# Patient Record
Sex: Male | Born: 1940 | Race: White | Hispanic: No | Marital: Married | Smoking: Never smoker
Health system: Southern US, Community
[De-identification: ages and names within clinical notes are randomized; demographics above are authoritative.]

## PROBLEM LIST (undated history)

## (undated) DIAGNOSIS — I2510 Atherosclerotic heart disease of native coronary artery without angina pectoris: Secondary | ICD-10-CM

## (undated) DIAGNOSIS — I499 Cardiac arrhythmia, unspecified: Secondary | ICD-10-CM

## (undated) DIAGNOSIS — D649 Anemia, unspecified: Secondary | ICD-10-CM

## (undated) DIAGNOSIS — I4892 Unspecified atrial flutter: Secondary | ICD-10-CM

## (undated) DIAGNOSIS — G959 Disease of spinal cord, unspecified: Secondary | ICD-10-CM

## (undated) DIAGNOSIS — N183 Chronic kidney disease, stage 3 unspecified: Secondary | ICD-10-CM

## (undated) DIAGNOSIS — Z8719 Personal history of other diseases of the digestive system: Secondary | ICD-10-CM

## (undated) DIAGNOSIS — F329 Major depressive disorder, single episode, unspecified: Secondary | ICD-10-CM

## (undated) DIAGNOSIS — IMO0001 Reserved for inherently not codable concepts without codable children: Secondary | ICD-10-CM

## (undated) DIAGNOSIS — R296 Repeated falls: Secondary | ICD-10-CM

## (undated) DIAGNOSIS — F32A Depression, unspecified: Secondary | ICD-10-CM

## (undated) DIAGNOSIS — I82409 Acute embolism and thrombosis of unspecified deep veins of unspecified lower extremity: Secondary | ICD-10-CM

## (undated) DIAGNOSIS — H5461 Unqualified visual loss, right eye, normal vision left eye: Secondary | ICD-10-CM

## (undated) DIAGNOSIS — I872 Venous insufficiency (chronic) (peripheral): Secondary | ICD-10-CM

## (undated) DIAGNOSIS — M4802 Spinal stenosis, cervical region: Secondary | ICD-10-CM

## (undated) DIAGNOSIS — Z95810 Presence of automatic (implantable) cardiac defibrillator: Secondary | ICD-10-CM

## (undated) DIAGNOSIS — I429 Cardiomyopathy, unspecified: Secondary | ICD-10-CM

## (undated) DIAGNOSIS — I10 Essential (primary) hypertension: Secondary | ICD-10-CM

## (undated) DIAGNOSIS — G90521 Complex regional pain syndrome I of right lower limb: Secondary | ICD-10-CM

## (undated) DIAGNOSIS — K219 Gastro-esophageal reflux disease without esophagitis: Secondary | ICD-10-CM

## (undated) DIAGNOSIS — J189 Pneumonia, unspecified organism: Secondary | ICD-10-CM

## (undated) DIAGNOSIS — M1990 Unspecified osteoarthritis, unspecified site: Secondary | ICD-10-CM

## (undated) DIAGNOSIS — G9340 Encephalopathy, unspecified: Secondary | ICD-10-CM

## (undated) DIAGNOSIS — I639 Cerebral infarction, unspecified: Secondary | ICD-10-CM

## (undated) DIAGNOSIS — G589 Mononeuropathy, unspecified: Secondary | ICD-10-CM

## (undated) DIAGNOSIS — F0390 Unspecified dementia without behavioral disturbance: Secondary | ICD-10-CM

## (undated) DIAGNOSIS — E785 Hyperlipidemia, unspecified: Secondary | ICD-10-CM

## (undated) DIAGNOSIS — I219 Acute myocardial infarction, unspecified: Secondary | ICD-10-CM

## (undated) DIAGNOSIS — G8250 Quadriplegia, unspecified: Secondary | ICD-10-CM

## (undated) DIAGNOSIS — I5022 Chronic systolic (congestive) heart failure: Secondary | ICD-10-CM

## (undated) DIAGNOSIS — E119 Type 2 diabetes mellitus without complications: Secondary | ICD-10-CM

## (undated) DIAGNOSIS — E162 Hypoglycemia, unspecified: Secondary | ICD-10-CM

## (undated) DIAGNOSIS — G90511 Complex regional pain syndrome I of right upper limb: Secondary | ICD-10-CM

## (undated) DIAGNOSIS — M48061 Spinal stenosis, lumbar region without neurogenic claudication: Secondary | ICD-10-CM

## (undated) HISTORY — DX: Encephalopathy, unspecified: G93.40

## (undated) HISTORY — DX: Repeated falls: R29.6

## (undated) HISTORY — DX: Unspecified atrial flutter: I48.92

## (undated) HISTORY — DX: Quadriplegia, unspecified: G82.50

## (undated) HISTORY — DX: Chronic kidney disease, stage 3 (moderate): N18.3

## (undated) HISTORY — PX: TOTAL HIP ARTHROPLASTY: SHX124

## (undated) HISTORY — DX: Chronic kidney disease, stage 3 unspecified: N18.30

## (undated) HISTORY — DX: Venous insufficiency (chronic) (peripheral): I87.2

## (undated) HISTORY — PX: TOE AMPUTATION: SHX809

## (undated) HISTORY — DX: Disease of spinal cord, unspecified: G95.9

## (undated) HISTORY — PX: OTHER SURGICAL HISTORY: SHX169

## (undated) HISTORY — PX: CARPAL TUNNEL RELEASE: SHX101

## (undated) HISTORY — DX: Atherosclerotic heart disease of native coronary artery without angina pectoris: I25.10

## (undated) HISTORY — DX: Unspecified osteoarthritis, unspecified site: M19.90

## (undated) HISTORY — DX: Complex regional pain syndrome i of right lower limb: G90.521

## (undated) HISTORY — DX: Chronic systolic (congestive) heart failure: I50.22

## (undated) HISTORY — DX: Hypoglycemia, unspecified: E16.2

## (undated) HISTORY — DX: Mononeuropathy, unspecified: G58.9

## (undated) HISTORY — PX: CATARACT EXTRACTION W/ INTRAOCULAR LENS IMPLANT: SHX1309

---
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
## 2000-11-16 ENCOUNTER — Encounter: Payer: Self-pay | Admitting: Family Medicine

## 2000-11-16 ENCOUNTER — Encounter: Admission: RE | Admit: 2000-11-16 | Discharge: 2000-11-16 | Payer: Self-pay | Admitting: Family Medicine

## 2001-03-03 ENCOUNTER — Encounter: Payer: Self-pay | Admitting: Emergency Medicine

## 2001-03-03 ENCOUNTER — Emergency Department (HOSPITAL_COMMUNITY): Admission: EM | Admit: 2001-03-03 | Discharge: 2001-03-03 | Payer: Self-pay | Admitting: Emergency Medicine

## 2001-04-05 ENCOUNTER — Ambulatory Visit (HOSPITAL_COMMUNITY): Admission: RE | Admit: 2001-04-05 | Discharge: 2001-04-05 | Payer: Self-pay | Admitting: Internal Medicine

## 2001-11-27 ENCOUNTER — Encounter (HOSPITAL_BASED_OUTPATIENT_CLINIC_OR_DEPARTMENT_OTHER): Admission: RE | Admit: 2001-11-27 | Discharge: 2002-02-25 | Payer: Self-pay | Admitting: Internal Medicine

## 2001-11-29 ENCOUNTER — Encounter (HOSPITAL_BASED_OUTPATIENT_CLINIC_OR_DEPARTMENT_OTHER): Payer: Self-pay | Admitting: Internal Medicine

## 2001-11-29 ENCOUNTER — Encounter: Admission: RE | Admit: 2001-11-29 | Discharge: 2001-11-29 | Payer: Self-pay | Admitting: Internal Medicine

## 2001-12-14 ENCOUNTER — Encounter (INDEPENDENT_AMBULATORY_CARE_PROVIDER_SITE_OTHER): Payer: Self-pay | Admitting: Specialist

## 2001-12-14 ENCOUNTER — Ambulatory Visit (HOSPITAL_BASED_OUTPATIENT_CLINIC_OR_DEPARTMENT_OTHER): Admission: RE | Admit: 2001-12-14 | Discharge: 2001-12-14 | Payer: Self-pay | Admitting: Orthopedic Surgery

## 2002-11-05 ENCOUNTER — Encounter: Admission: RE | Admit: 2002-11-05 | Discharge: 2002-11-05 | Payer: Self-pay | Admitting: Orthopedic Surgery

## 2002-11-05 ENCOUNTER — Encounter: Payer: Self-pay | Admitting: Orthopedic Surgery

## 2005-11-08 ENCOUNTER — Ambulatory Visit: Payer: Self-pay | Admitting: Family Medicine

## 2005-11-09 ENCOUNTER — Ambulatory Visit: Payer: Self-pay | Admitting: *Deleted

## 2005-12-14 ENCOUNTER — Ambulatory Visit: Payer: Self-pay | Admitting: Family Medicine

## 2006-02-01 ENCOUNTER — Ambulatory Visit: Payer: Self-pay | Admitting: Family Medicine

## 2006-02-14 ENCOUNTER — Ambulatory Visit: Payer: Self-pay | Admitting: Family Medicine

## 2006-02-16 ENCOUNTER — Ambulatory Visit (HOSPITAL_COMMUNITY): Admission: RE | Admit: 2006-02-16 | Discharge: 2006-02-16 | Payer: Self-pay | Admitting: Internal Medicine

## 2006-04-18 ENCOUNTER — Ambulatory Visit: Payer: Self-pay | Admitting: Family Medicine

## 2006-08-24 ENCOUNTER — Ambulatory Visit: Payer: Self-pay | Admitting: Internal Medicine

## 2006-12-06 ENCOUNTER — Ambulatory Visit: Admission: RE | Admit: 2006-12-06 | Discharge: 2006-12-06 | Payer: Self-pay | Admitting: Family Medicine

## 2006-12-06 ENCOUNTER — Ambulatory Visit: Payer: Self-pay | Admitting: Surgery

## 2007-03-20 ENCOUNTER — Emergency Department (HOSPITAL_COMMUNITY): Admission: EM | Admit: 2007-03-20 | Discharge: 2007-03-20 | Payer: Self-pay | Admitting: Emergency Medicine

## 2008-12-03 ENCOUNTER — Encounter: Admission: RE | Admit: 2008-12-03 | Discharge: 2008-12-03 | Payer: Self-pay | Admitting: Family Medicine

## 2008-12-22 ENCOUNTER — Encounter: Admission: RE | Admit: 2008-12-22 | Discharge: 2008-12-22 | Payer: Self-pay | Admitting: Orthopedic Surgery

## 2009-05-27 ENCOUNTER — Inpatient Hospital Stay (HOSPITAL_COMMUNITY): Admission: RE | Admit: 2009-05-27 | Discharge: 2009-06-01 | Payer: Self-pay | Admitting: Orthopedic Surgery

## 2010-06-23 LAB — GLUCOSE, CAPILLARY
Glucose-Capillary: 128 mg/dL — ABNORMAL HIGH (ref 70–99)
Glucose-Capillary: 133 mg/dL — ABNORMAL HIGH (ref 70–99)
Glucose-Capillary: 141 mg/dL — ABNORMAL HIGH (ref 70–99)
Glucose-Capillary: 152 mg/dL — ABNORMAL HIGH (ref 70–99)
Glucose-Capillary: 155 mg/dL — ABNORMAL HIGH (ref 70–99)
Glucose-Capillary: 156 mg/dL — ABNORMAL HIGH (ref 70–99)
Glucose-Capillary: 162 mg/dL — ABNORMAL HIGH (ref 70–99)
Glucose-Capillary: 191 mg/dL — ABNORMAL HIGH (ref 70–99)
Glucose-Capillary: 203 mg/dL — ABNORMAL HIGH (ref 70–99)
Glucose-Capillary: 215 mg/dL — ABNORMAL HIGH (ref 70–99)
Glucose-Capillary: 307 mg/dL — ABNORMAL HIGH (ref 70–99)

## 2010-06-23 LAB — BASIC METABOLIC PANEL
BUN: 22 mg/dL (ref 6–23)
CO2: 27 mEq/L (ref 19–32)
CO2: 28 mEq/L (ref 19–32)
Calcium: 8.2 mg/dL — ABNORMAL LOW (ref 8.4–10.5)
Calcium: 8.9 mg/dL (ref 8.4–10.5)
Chloride: 104 mEq/L (ref 96–112)
Chloride: 105 mEq/L (ref 96–112)
Chloride: 107 mEq/L (ref 96–112)
Creatinine, Ser: 1.14 mg/dL (ref 0.4–1.5)
GFR calc Af Amer: >60 mL/min (ref >60)
GFR calc Af Amer: >60 mL/min (ref >60)
GFR calc Af Amer: >60 mL/min (ref >60)
Glucose, Bld: 178 mg/dL — ABNORMAL HIGH (ref 70–99)
Glucose, Bld: 190 mg/dL — ABNORMAL HIGH (ref 70–99)
Potassium: 4.7 mEq/L (ref 3.5–5.1)
Sodium: 139 mEq/L (ref 135–145)
Sodium: 139 mEq/L (ref 135–145)

## 2010-06-23 LAB — PROTIME-INR
INR: 1.03 (ref 0.00–1.49)
INR: 1.6 — ABNORMAL HIGH (ref 0.00–1.49)
INR: 1.99 — ABNORMAL HIGH (ref 0.00–1.49)
Prothrombin Time: 13.4 seconds (ref 11.6–15.2)
Prothrombin Time: 18.9 seconds — ABNORMAL HIGH (ref 11.6–15.2)
Prothrombin Time: 21.3 seconds — ABNORMAL HIGH (ref 11.6–15.2)

## 2010-06-23 LAB — CBC
HCT: 29.4 % — ABNORMAL LOW (ref 39.0–52.0)
HCT: 33.8 % — ABNORMAL LOW (ref 39.0–52.0)
Hemoglobin: 10.1 g/dL — ABNORMAL LOW (ref 13.0–17.0)
Hemoglobin: 11.3 g/dL — ABNORMAL LOW (ref 13.0–17.0)
Hemoglobin: 8.8 g/dL — ABNORMAL LOW (ref 13.0–17.0)
MCHC: 33.9 g/dL (ref 30.0–36.0)
MCHC: 34.1 g/dL (ref 30.0–36.0)
MCHC: 34.3 g/dL (ref 30.0–36.0)
MCV: 94.7 fL (ref 78.0–100.0)
MCV: 95.2 fL (ref 78.0–100.0)
MCV: 95.8 fL (ref 78.0–100.0)
MCV: 95.8 fL (ref 78.0–100.0)
Platelets: 198 10*3/uL (ref 150–400)
Platelets: 277 10*3/uL (ref 150–400)
RBC: 2.73 MIL/uL — ABNORMAL LOW (ref 4.22–5.81)
RBC: 2.85 MIL/uL — ABNORMAL LOW (ref 4.22–5.81)
RBC: 3.07 MIL/uL — ABNORMAL LOW (ref 4.22–5.81)
RDW: 13 % (ref 11.5–15.5)
RDW: 13.1 % (ref 11.5–15.5)
RDW: 13.1 % (ref 11.5–15.5)
WBC: 7.5 10*3/uL (ref 4.0–10.5)
WBC: 9.9 10*3/uL (ref 4.0–10.5)

## 2010-06-23 LAB — COMPREHENSIVE METABOLIC PANEL
Albumin: 3.5 g/dL (ref 3.5–5.2)
Alkaline Phosphatase: 41 U/L (ref 39–117)
BUN: 25 mg/dL — ABNORMAL HIGH (ref 6–23)
Chloride: 105 mEq/L (ref 96–112)
Creatinine, Ser: 1.33 mg/dL (ref 0.4–1.5)
Glucose, Bld: 159 mg/dL — ABNORMAL HIGH (ref 70–99)
Total Bilirubin: 0.8 mg/dL (ref 0.3–1.2)

## 2010-06-23 LAB — HEMOGLOBIN AND HEMATOCRIT, BLOOD
HCT: 27.2 % — ABNORMAL LOW (ref 39.0–52.0)
Hemoglobin: 9.3 g/dL — ABNORMAL LOW (ref 13.0–17.0)

## 2010-06-23 LAB — APTT: aPTT: 27 seconds (ref 24–37)

## 2010-06-29 ENCOUNTER — Other Ambulatory Visit (HOSPITAL_COMMUNITY): Payer: Self-pay | Admitting: Orthopedic Surgery

## 2010-06-29 ENCOUNTER — Encounter (HOSPITAL_COMMUNITY)
Admission: RE | Admit: 2010-06-29 | Discharge: 2010-06-29 | Disposition: A | Discharge status: Home / Self Care | Payer: Medicare Other | Source: Ambulatory Visit | Attending: Orthopedic Surgery | Admitting: Orthopedic Surgery

## 2010-06-29 DIAGNOSIS — Z01811 Encounter for preprocedural respiratory examination: Secondary | ICD-10-CM

## 2010-06-29 LAB — COMPREHENSIVE METABOLIC PANEL
ALT: 19 U/L (ref 0–53)
Alkaline Phosphatase: 42 U/L (ref 39–117)
CO2: 30 mEq/L (ref 19–32)
Chloride: 105 mEq/L (ref 96–112)
GFR calc non Af Amer: 40 mL/min — ABNORMAL LOW (ref >60)
Glucose, Bld: 99 mg/dL (ref 70–99)
Potassium: 5 mEq/L (ref 3.5–5.1)
Sodium: 141 mEq/L (ref 135–145)
Total Bilirubin: 0.5 mg/dL (ref 0.3–1.2)

## 2010-06-29 LAB — CBC
HCT: 31.5 % — ABNORMAL LOW (ref 39.0–52.0)
Hemoglobin: 10.2 g/dL — ABNORMAL LOW (ref 13.0–17.0)
MCH: 28.1 pg (ref 26.0–34.0)
MCHC: 32.4 g/dL (ref 30.0–36.0)
RDW: 14.4 % (ref 11.5–15.5)

## 2010-06-29 LAB — SURGICAL PCR SCREEN: Staphylococcus aureus: NEGATIVE

## 2010-06-30 ENCOUNTER — Other Ambulatory Visit: Payer: Self-pay | Admitting: Orthopedic Surgery

## 2010-06-30 ENCOUNTER — Ambulatory Visit (HOSPITAL_COMMUNITY)
Admission: RE | Admit: 2010-06-30 | Discharge: 2010-06-30 | Disposition: A | Discharge status: Home / Self Care | Payer: Medicare Other | Source: Ambulatory Visit | Attending: Orthopedic Surgery | Admitting: Orthopedic Surgery

## 2010-06-30 DIAGNOSIS — Z01810 Encounter for preprocedural cardiovascular examination: Secondary | ICD-10-CM | POA: Insufficient documentation

## 2010-06-30 DIAGNOSIS — Z01812 Encounter for preprocedural laboratory examination: Secondary | ICD-10-CM | POA: Insufficient documentation

## 2010-06-30 DIAGNOSIS — Z01818 Encounter for other preprocedural examination: Secondary | ICD-10-CM | POA: Insufficient documentation

## 2010-06-30 DIAGNOSIS — M9080 Osteopathy in diseases classified elsewhere, unspecified site: Secondary | ICD-10-CM | POA: Insufficient documentation

## 2010-06-30 DIAGNOSIS — E1169 Type 2 diabetes mellitus with other specified complication: Secondary | ICD-10-CM | POA: Insufficient documentation

## 2010-06-30 DIAGNOSIS — M86679 Other chronic osteomyelitis, unspecified ankle and foot: Secondary | ICD-10-CM | POA: Insufficient documentation

## 2010-06-30 DIAGNOSIS — I10 Essential (primary) hypertension: Secondary | ICD-10-CM | POA: Insufficient documentation

## 2010-06-30 LAB — GLUCOSE, CAPILLARY: Glucose-Capillary: 114 mg/dL — ABNORMAL HIGH (ref 70–99)

## 2010-07-11 NOTE — Op Note (Signed)
  NAME:  [name], [name]             ACCOUNT NO.:  [id number]  MEDICAL RECORD NO.:  [id number]           PATIENT TYPE:  O  LOCATION:  SDSC                         FACILITY:  [facility]  PHYSICIAN:  Nadara Mustard, MD     DATE OF BIRTH:  Apr 26, 1940  DATE OF PROCEDURE: DATE OF DISCHARGE:  06/30/2010                              OPERATIVE REPORT   PREOPERATIVE DIAGNOSIS:  Osteomyelitis abscess, Wagner grade 3 ulcer, left foot third toe.  POSTOPERATIVE DIAGNOSIS:  Osteomyelitis abscess, Wagner grade 3 ulcer, left foot third toe.  PROCEDURE:  Left third toe amputation of the MTP joint.  SURGEON:  Nadara Mustard, MD  ANESTHESIA:  General.  ESTIMATED BLOOD LOSS:  Minimal.  ANTIBIOTICS:  Kefzol 1 g.  DRAINS:  None.  COMPLICATIONS:  None.  TOURNIQUET TIME:  None.  DISPOSITION:  PACU in stable condition.  INDICATIONS FOR PROCEDURE:  The patient is a 70 year old gentleman with type 2 diabetes status post previous toe amputations who presents at this time for osteomyelitis, abscess, and ulcer to the left third toe. He has failed conservative care and presents at this time for surgical intervention.  Risks and benefits were discussed including persistent infection, neurovascular injury, nonhealing of the wound, need for additional surgery.  The patient states he understands and wish to proceed at this time.  DESCRIPTION OF PROCEDURE:  The patient was brought to OR room 10 and underwent a general anesthetic.  After adequate level of anesthesia obtained, the patient's left lower extremity was prepped using DuraPrep and draped into a sterile field.  A racket incision was made around the toe.  The toe was amputated through the MTP joint.  The wound was irrigated with normal saline.  Electrocautery was used for hemostasis. The incision was closed using 2-0 nylon without tension on the skin.  The wound was covered Adaptic, orthopedic sponges, Webril, and Coban.  The patient  was extubated, taken to PACU in stable condition.  The patient wishes for discharge to home.  Follow up in the office in 1 week.     Nadara Mustard, MD     MVD/MEDQ  D:  06/30/2010  T:  07/01/2010  Job:  [id number]  Electronically Signed by Aldean Baker MD on 07/11/2010 08:11:39 PM

## 2010-08-20 NOTE — Consult Note (Signed)
NAME:  [name], [name]                       ACCOUNT NO.:  [id number]   MEDICAL RECORD NO.:  [id number]                   PATIENT TYPE:  REC   LOCATION:  FOOT                                 FACILITY:  [facility]   PHYSICIAN:  Jonelle Sports. Sevier, M.D.              DATE OF BIRTH:  16-Feb-1941   DATE OF CONSULTATION:  11/29/2001  DATE OF DISCHARGE:                                   CONSULTATION   HISTORY:  This 70 year old white male comes referred by Dr. Shelle Iron for  assistance with management of an ulcer on the plantar aspect of the right  hallux which has been present for two months and also for a new ulceration  involving interdigital space of the left fourth toe.   The patient has had type 2 diabetes for some 10 years and has significant  neuropathy.  Nonetheless, he does seem to have protective sensation.  In  spite this he has developed an ulcer on the plantar aspect of the right  hallux which has been present now for several months.  He has treated this  himself with antibiotic cream and peroxide washes but it has not improved  and so two weeks ago was given a 10 day course of antibiotics by [place] Constable for suspected infection there.  At the time, also, he had had some  trauma to the dorsal aspect of the left foot and the resulted swelling and  slight deformity in the fourth toe.  She wondered about the possibility of  fracture there and apparently did not note an interdigital ulcer at that  time.  Nonetheless, the patient says that there has been an ulcer there and  that there has been considerable drainage now for a couple of weeks in that  area.   PAST MEDICAL HISTORY:  The patient has hypertension in addition to his  diabetes.   REGULAR MEDICATIONS:  Glucotrol XL, atenolol, dicyclomine, HCTZ, Darvocet,  Naprosyn, chromium picolinate, glucosamine and chondroitin, saw palmetto,  garlic, and some other over-the-counters.   PHYSICAL EXAMINATION:  EXTREMITIES:  Examination today  is limited to the  distal lower extremities.  The feet are not grossly edematous and are  without gross deformity.  Skin temperatures are symmetrical and equal  bilaterally.  Pulses are everywhere adequate and palpable.  Monofilament  testing shows that he has a persistence of protective sensation throughout  both feet.   On the plantar aspect of the distal phalanx of the right hallux is a  callused area with a central ulceration measuring 14 x 7 mm x 3 mm in depth  prior to debridement.   In addition, there is a swelling, slight dorsal displacement, and drainage  of the fourth toe on the left foot and he is noted to have a deep ulceration  in the interdigital space there which probes to bone.   DISPOSITION:  1. The ulcer on the plantar aspect of the hallux  of the right foot is     debrided sharply partial thickness and this renders the ulcer somewhat     more shallow.  The base appears clean and this is accordingly then     dressed with Iodosorb Allevyn and he is placed in a Darco platform sandal     on that boot.  Because of this and his prior instability of gait, even     so, he is given a walker and instructed in its proper use.  2. The ulcer in the interdigital aspect of the fourth toe in the four/five     interspace is sharply debrided full thickness.  There does not seem to be     necessity for any subcutaneous debridement, but as previously indicated,     the wound clearly probes to bone.  3. Because of that lesion he is placed on cephalexin 500 mg q.i.d.  4. He is to treat the left foot lesion with Neosporin, lamb's wool, and     continue his regular foot wear.  5.     The left foot will be x-rayed to look for fracture and/or osteomyelitis.  6. It is discussed with the patient that likely amputation of that fourth     digit will be advisable.  7. Follow-up visit here will be in five days by Dr. Aldean Baker.                                               Jonelle Sports. Cheryll Cockayne,  M.D.    RES/MEDQ  D:  11/29/2001  T:  11/29/2001  Job:  [id number]   cc:   [place] Constable, NP

## 2010-08-20 NOTE — Op Note (Signed)
NAME:  [name], [name]                       ACCOUNT NO.:  [id number]   MEDICAL RECORD NO.:  [id number]                   PATIENT TYPE:  AMB   LOCATION:  DSC                                  FACILITY:  [facility]   PHYSICIAN:  Nadara Mustard, M.D.                DATE OF BIRTH:  01/22/1941   DATE OF PROCEDURE:  12/14/2001  DATE OF DISCHARGE:                                 OPERATIVE REPORT   PREOPERATIVE DIAGNOSES:  Osteomyelitis with Wagner grade 3 ulcer left fourth  toe.   POSTOPERATIVE DIAGNOSES:  Osteomyelitis with Wagner grade 3 ulcer left  fourth toe.   OPERATION PERFORMED:  Amputation of left fourth toe through the  metatarsophalangeal joint.   SURGEON:  Nadara Mustard, M.D.   ANESTHESIA:  Ankle block.   ESTIMATED BLOOD LOSS:  Minimal.   ANTIBIOTICS:  1 gm Kefzol.   TOURNIQUET TIME:  None.   DISPOSITION:  To PACU in stable condition.  Plan for discharge to home.   INDICATIONS FOR PROCEDURE:  The patient is a 70 year old gentleman with type  2 diabetes who has had a chronic ulcer over the left fourth toe which has  eroded to bone with osteomyelitis proven radiographically.  The patient has  good dorsalis pedis pulse.  He has failed conservative care with pressure  unloading and p.o. antibiotics and presents at this time for surgical  removal of the infected toe.  The risks and benefits were discussed  including infection and neurovascular injury, persistent pain and need for  higher level of amputation.  The patient states he understands and wishes to  proceed at this time.   DESCRIPTION OF PROCEDURE:  The patient underwent an ankle block and then was  brought to outpatient #2.  After an adequate level of anesthesia was  obtained, the patient's left lower extremity was prepped using DuraPrep and  draped into a sterile field.  A racquet type incision was made just distal  to the metatarsophalangeal joint.  The toe was amputated through the MTP  joint and sent to  pathology for identification.  The wound was irrigated.  The Bovie was used for hemostasis.  The skin was closed using a vertical  mattress with 2-0 nylon.  The wound was covered with Adaptic, orthopedic  sponges, sterile Webril and a loosely wrapped Coban.  The patient was taken  to PACU in stable condition.  The patient had been scheduled for 23 hours'  observation; however, the patient wished to return home.  He will be non  weightbearing on the left, elevate his leg, p.o. Keflex antibiotics, Vicodin  for pain, plan to follow up Tuesday at the foot clinic.  Nadara Mustard, M.D.    MVD/MEDQ  D:  12/14/2001  T:  12/15/2001  Job:  [id number]

## 2010-08-24 ENCOUNTER — Other Ambulatory Visit: Payer: Self-pay | Admitting: Physical Medicine and Rehabilitation

## 2010-08-24 DIAGNOSIS — M545 Low back pain: Secondary | ICD-10-CM

## 2010-09-01 ENCOUNTER — Ambulatory Visit
Admission: RE | Admit: 2010-09-01 | Discharge: 2010-09-01 | Disposition: A | Discharge status: Home / Self Care | Payer: Medicare Other | Source: Ambulatory Visit | Attending: Physical Medicine and Rehabilitation | Admitting: Physical Medicine and Rehabilitation

## 2010-09-01 DIAGNOSIS — M545 Low back pain: Secondary | ICD-10-CM

## 2011-01-07 LAB — CBC
Platelets: 332
RBC: 3.36 — ABNORMAL LOW
WBC: 10.4

## 2011-01-07 LAB — COMPREHENSIVE METABOLIC PANEL
ALT: 17
AST: 26
Albumin: 3.5
Alkaline Phosphatase: 42
CO2: 26
Chloride: 107
Creatinine, Ser: 1.16
GFR calc Af Amer: >60
Potassium: 4.8
Sodium: 140
Total Bilirubin: 1.3 — ABNORMAL HIGH

## 2011-01-07 LAB — DIFFERENTIAL
Basophils Absolute: 0
Eosinophils Absolute: 0.3
Eosinophils Relative: 3
Lymphocytes Relative: 17
Monocytes Absolute: 0.8

## 2011-01-07 LAB — ACETAMINOPHEN LEVEL: Acetaminophen (Tylenol), Serum: <10 — ABNORMAL LOW

## 2011-02-04 ENCOUNTER — Ambulatory Visit: Payer: Medicare Other | Admitting: Physical Medicine and Rehabilitation

## 2011-02-28 ENCOUNTER — Encounter: Payer: Medicare Other | Admitting: Physical Medicine and Rehabilitation

## 2011-03-21 ENCOUNTER — Ambulatory Visit: Payer: Medicare Other | Admitting: Physical Medicine & Rehabilitation

## 2011-04-05 DIAGNOSIS — G8250 Quadriplegia, unspecified: Secondary | ICD-10-CM

## 2011-04-05 HISTORY — PX: JOINT REPLACEMENT: SHX530

## 2011-04-05 HISTORY — DX: Quadriplegia, unspecified: G82.50

## 2011-04-19 ENCOUNTER — Ambulatory Visit: Payer: Medicare Other | Admitting: Physical Medicine & Rehabilitation

## 2011-10-03 ENCOUNTER — Emergency Department (HOSPITAL_COMMUNITY): Payer: Medicare Other

## 2011-10-03 ENCOUNTER — Encounter (HOSPITAL_COMMUNITY): Payer: Self-pay | Admitting: *Deleted

## 2011-10-03 ENCOUNTER — Observation Stay (HOSPITAL_COMMUNITY)
Admission: EM | Admit: 2011-10-03 | Discharge: 2011-10-07 | Disposition: A | Discharge status: Skilled Nursing Facility | Payer: Medicare Other | Attending: Internal Medicine | Admitting: Internal Medicine

## 2011-10-03 DIAGNOSIS — M48061 Spinal stenosis, lumbar region without neurogenic claudication: Secondary | ICD-10-CM | POA: Insufficient documentation

## 2011-10-03 DIAGNOSIS — IMO0002 Reserved for concepts with insufficient information to code with codable children: Secondary | ICD-10-CM

## 2011-10-03 DIAGNOSIS — R531 Weakness: Secondary | ICD-10-CM

## 2011-10-03 DIAGNOSIS — E1165 Type 2 diabetes mellitus with hyperglycemia: Secondary | ICD-10-CM

## 2011-10-03 DIAGNOSIS — R296 Repeated falls: Secondary | ICD-10-CM

## 2011-10-03 DIAGNOSIS — IMO0001 Reserved for inherently not codable concepts without codable children: Secondary | ICD-10-CM | POA: Insufficient documentation

## 2011-10-03 DIAGNOSIS — G589 Mononeuropathy, unspecified: Secondary | ICD-10-CM | POA: Insufficient documentation

## 2011-10-03 DIAGNOSIS — I10 Essential (primary) hypertension: Secondary | ICD-10-CM

## 2011-10-03 DIAGNOSIS — K5903 Drug induced constipation: Secondary | ICD-10-CM | POA: Diagnosis present

## 2011-10-03 DIAGNOSIS — I872 Venous insufficiency (chronic) (peripheral): Secondary | ICD-10-CM | POA: Diagnosis present

## 2011-10-03 DIAGNOSIS — E785 Hyperlipidemia, unspecified: Secondary | ICD-10-CM | POA: Diagnosis present

## 2011-10-03 DIAGNOSIS — I82409 Acute embolism and thrombosis of unspecified deep veins of unspecified lower extremity: Secondary | ICD-10-CM

## 2011-10-03 DIAGNOSIS — T50995A Adverse effect of other drugs, medicaments and biological substances, initial encounter: Secondary | ICD-10-CM | POA: Insufficient documentation

## 2011-10-03 DIAGNOSIS — D649 Anemia, unspecified: Secondary | ICD-10-CM

## 2011-10-03 DIAGNOSIS — R5381 Other malaise: Principal | ICD-10-CM | POA: Insufficient documentation

## 2011-10-03 DIAGNOSIS — E118 Type 2 diabetes mellitus with unspecified complications: Secondary | ICD-10-CM

## 2011-10-03 DIAGNOSIS — M79609 Pain in unspecified limb: Secondary | ICD-10-CM | POA: Insufficient documentation

## 2011-10-03 DIAGNOSIS — R269 Unspecified abnormalities of gait and mobility: Secondary | ICD-10-CM

## 2011-10-03 DIAGNOSIS — M1990 Unspecified osteoarthritis, unspecified site: Secondary | ICD-10-CM

## 2011-10-03 DIAGNOSIS — G90511 Complex regional pain syndrome I of right upper limb: Secondary | ICD-10-CM | POA: Diagnosis present

## 2011-10-03 DIAGNOSIS — Z9181 History of falling: Secondary | ICD-10-CM | POA: Insufficient documentation

## 2011-10-03 DIAGNOSIS — I82A11 Acute embolism and thrombosis of right axillary vein: Secondary | ICD-10-CM | POA: Diagnosis present

## 2011-10-03 DIAGNOSIS — E1122 Type 2 diabetes mellitus with diabetic chronic kidney disease: Secondary | ICD-10-CM | POA: Diagnosis present

## 2011-10-03 DIAGNOSIS — I82629 Acute embolism and thrombosis of deep veins of unspecified upper extremity: Secondary | ICD-10-CM | POA: Insufficient documentation

## 2011-10-03 DIAGNOSIS — K5909 Other constipation: Secondary | ICD-10-CM | POA: Insufficient documentation

## 2011-10-03 HISTORY — DX: Essential (primary) hypertension: I10

## 2011-10-03 HISTORY — DX: Hyperlipidemia, unspecified: E78.5

## 2011-10-03 HISTORY — DX: Acute embolism and thrombosis of unspecified deep veins of unspecified lower extremity: I82.409

## 2011-10-03 LAB — COMPREHENSIVE METABOLIC PANEL
Alkaline Phosphatase: 94 U/L (ref 39–117)
BUN: 31 mg/dL — ABNORMAL HIGH (ref 6–23)
Chloride: 108 mEq/L (ref 96–112)
GFR calc Af Amer: 71 mL/min — ABNORMAL LOW (ref >90)
GFR calc non Af Amer: 61 mL/min — ABNORMAL LOW (ref >90)
Glucose, Bld: 93 mg/dL (ref 70–99)
Potassium: 4.2 mEq/L (ref 3.5–5.1)
Total Bilirubin: 0.3 mg/dL (ref 0.3–1.2)

## 2011-10-03 LAB — RETICULOCYTES
RBC.: 2.94 MIL/uL — ABNORMAL LOW (ref 4.22–5.81)
Retic Count, Absolute: 52.9 10*3/uL (ref 19.0–186.0)

## 2011-10-03 LAB — CK TOTAL AND CKMB (NOT AT ARMC): Total CK: 94 U/L (ref 7–232)

## 2011-10-03 LAB — CBC
HCT: 24 % — ABNORMAL LOW (ref 39.0–52.0)
Hemoglobin: 7.2 g/dL — ABNORMAL LOW (ref 13.0–17.0)
MCH: 24.2 pg — ABNORMAL LOW (ref 26.0–34.0)
RBC: 2.98 MIL/uL — ABNORMAL LOW (ref 4.22–5.81)

## 2011-10-03 LAB — URINALYSIS, ROUTINE W REFLEX MICROSCOPIC
Bilirubin Urine: NEGATIVE
Glucose, UA: NEGATIVE mg/dL
Hgb urine dipstick: NEGATIVE
Ketones, ur: NEGATIVE mg/dL
Leukocytes, UA: NEGATIVE
pH: 5 (ref 5.0–8.0)

## 2011-10-03 LAB — GLUCOSE, CAPILLARY: Glucose-Capillary: 137 mg/dL — ABNORMAL HIGH (ref 70–99)

## 2011-10-03 LAB — POCT I-STAT, CHEM 8
BUN: 32 mg/dL — ABNORMAL HIGH (ref 6–23)
Calcium, Ion: 1.33 mmol/L — ABNORMAL HIGH (ref 1.12–1.32)
Chloride: 111 mEq/L (ref 96–112)
Glucose, Bld: 95 mg/dL (ref 70–99)

## 2011-10-03 LAB — OCCULT BLOOD, POC DEVICE: Fecal Occult Bld: NEGATIVE

## 2011-10-03 LAB — ABO/RH: ABO/RH(D): A POS

## 2011-10-03 LAB — PREPARE RBC (CROSSMATCH)

## 2011-10-03 LAB — DIFFERENTIAL
Lymphs Abs: 1 10*3/uL (ref 0.7–4.0)
Monocytes Relative: 10 % (ref 3–12)
Neutro Abs: 4.5 10*3/uL (ref 1.7–7.7)
Neutrophils Relative %: 72 % (ref 43–77)

## 2011-10-03 LAB — PROTIME-INR: INR: 1.34 (ref 0.00–1.49)

## 2011-10-03 MED ORDER — VITAMIN D3 25 MCG (1000 UNIT) PO TABS
2000.0000 [IU] | ORAL_TABLET | Freq: Every day | ORAL | Status: DC
  Administered 2011-10-04 – 2011-10-07 (×4): 2000 [IU] via ORAL
  Filled 2011-10-03 (×4): qty 2

## 2011-10-03 MED ORDER — ONDANSETRON HCL 4 MG PO TABS: 4.0000 mg | ORAL_TABLET | Freq: Four times a day (QID) | ORAL | Status: DC | PRN

## 2011-10-03 MED ORDER — HYDROCODONE-ACETAMINOPHEN 5-325 MG PO TABS: 1.0000 | ORAL_TABLET | Freq: Four times a day (QID) | ORAL | Status: DC | PRN

## 2011-10-03 MED ORDER — SIMVASTATIN 20 MG PO TABS
20.0000 mg | ORAL_TABLET | Freq: Every evening | ORAL | Status: DC
  Administered 2011-10-04 – 2011-10-06 (×3): 20 mg via ORAL
  Filled 2011-10-03 (×2): qty 1

## 2011-10-03 MED ORDER — METFORMIN HCL 500 MG PO TABS: 1000.0000 mg | ORAL_TABLET | Freq: Every day | ORAL | Status: DC

## 2011-10-03 MED ORDER — GLIMEPIRIDE 1 MG PO TABS
1.0000 mg | ORAL_TABLET | Freq: Two times a day (BID) | ORAL | Status: DC
  Administered 2011-10-04 – 2011-10-07 (×7): 1 mg via ORAL
  Filled 2011-10-03: qty 1

## 2011-10-03 MED ORDER — ACARBOSE 50 MG PO TABS
50.0000 mg | ORAL_TABLET | Freq: Three times a day (TID) | ORAL | Status: DC
  Filled 2011-10-03: qty 1

## 2011-10-03 MED ORDER — SENNA 8.6 MG PO TABS
1.0000 | ORAL_TABLET | Freq: Two times a day (BID) | ORAL | Status: DC
  Administered 2011-10-04 – 2011-10-07 (×7): 8.6 mg via ORAL
  Filled 2011-10-03 (×7): qty 1

## 2011-10-03 MED ORDER — INSULIN ASPART 100 UNIT/ML ~~LOC~~ SOLN
0.0000 [IU] | Freq: Three times a day (TID) | SUBCUTANEOUS | Status: DC
  Administered 2011-10-04: 2 [IU] via SUBCUTANEOUS

## 2011-10-03 MED ORDER — CAPTOPRIL 25 MG PO TABS
25.0000 mg | ORAL_TABLET | Freq: Three times a day (TID) | ORAL | Status: DC
  Administered 2011-10-04 – 2011-10-07 (×10): 25 mg via ORAL
  Filled 2011-10-03 (×2): qty 1

## 2011-10-03 MED ORDER — INSULIN ASPART 100 UNIT/ML ~~LOC~~ SOLN: 0.0000 [IU] | Freq: Every day | SUBCUTANEOUS | Status: DC

## 2011-10-03 MED ORDER — POLYETHYLENE GLYCOL 3350 17 G PO PACK
17.0000 g | PACK | Freq: Every day | ORAL | Status: DC | PRN
  Administered 2011-10-05: 17 g via ORAL
  Filled 2011-10-03: qty 1

## 2011-10-03 MED ORDER — ONDANSETRON HCL 4 MG/2ML IJ SOLN: 4.0000 mg | Freq: Four times a day (QID) | INTRAMUSCULAR | Status: DC | PRN

## 2011-10-03 MED ORDER — SODIUM CHLORIDE 0.9 % IJ SOLN: 3.0000 mL | INTRAMUSCULAR | Status: DC

## 2011-10-03 MED ORDER — METFORMIN HCL 500 MG PO TABS
500.0000 mg | ORAL_TABLET | Freq: Every day | ORAL | Status: DC
  Filled 2011-10-03: qty 1

## 2011-10-03 MED ORDER — METFORMIN HCL 500 MG PO TABS: 500.0000 mg | ORAL_TABLET | Freq: Two times a day (BID) | ORAL | Status: DC
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
## 2011-10-03 NOTE — ED Notes (Signed)
Pt reports fall last night or early this am at 230am.

## 2011-10-03 NOTE — ED Provider Notes (Signed)
History     CSN: [id number] Arrival date & time 10/03/11  1423 First MD Initiated Contact with Patient 10/03/11 1554      Chief Complaint  Patient presents with  . Fall  . Weakness   HPI Pt tried to get up and take a step when he felt weak and fell.  This happened twice for him today.  Pt has been having trouble with his balance.  He recently had a "brain scan" and was told that everything was OK.  Pt states he saw a nuerologist, Dr Terrace Arabia ? For this condition.  No fevers, no cough.  NO vomiting or diarrhea.  No chest pain.  No trouble with speech.  Overall, he has been having this trouble with his balance over the last couple of weeks to months. The patient states that his wife has been ill recently. She has not been able to cook and he states he has not been eating well over the last several months. He feels hungry and thirsty.  Past Medical History  Diagnosis Date  . Diabetes mellitus   . Hypertension   . Hyperlipidemia     No past surgical history on file.  No family history on file.  History  Substance Use Topics  . Smoking status: Not on file  . Smokeless tobacco: Not on file  . Alcohol Use:       Review of Systems  Constitutional: Negative for fever.  Respiratory: Negative for cough and shortness of breath.   Cardiovascular: Negative for chest pain.  Gastrointestinal: Negative for abdominal pain.  Genitourinary: Negative for dysuria.  Musculoskeletal:       Shoulder pain, pinched nerve, hip pain (chronic)  All other systems reviewed and are negative.    Allergies  Review of patient's allergies indicates no known allergies.  Home Medications   Current Outpatient Rx  Name Route Sig Dispense Refill  . ACARBOSE 50 MG PO TABS Oral Take 50 mg by mouth 3 (three) times daily with meals.    . ASPIRIN 81 MG PO TABS Oral Take 81 mg by mouth daily.    Marland Kitchen CAPTOPRIL 25 MG PO TABS Oral Take 25 mg by mouth 3 (three) times daily.    Marland Kitchen VITAMIN D 1000 UNITS PO TABS Oral Take  2,000 Units by mouth daily.    Marland Kitchen GLIMEPIRIDE 1 MG PO TABS Oral Take 1 mg by mouth 2 (two) times daily.    Marland Kitchen HYDROCODONE-ACETAMINOPHEN 5-500 MG PO TABS Oral Take 1 tablet by mouth every 6 (six) hours as needed. Pain    . METFORMIN HCL 500 MG PO TABS Oral Take 500-1,000 mg by mouth 2 (two) times daily. Pt takes 1 tablet in morning and 2 tablets in the evening    . SIMVASTATIN 20 MG PO TABS Oral Take 20 mg by mouth every evening.      BP 158/76  Pulse 77  Temp 98.3 F (36.8 C) (Oral)  Resp 16  SpO2 93%  Physical Exam  Nursing note and vitals reviewed. Constitutional: He appears well-developed and well-nourished. No distress.  HENT:  Head: Normocephalic.  Right Ear: External ear normal.  Left Ear: External ear normal.       Contusion right forehead  Eyes: Conjunctivae are normal. Right eye exhibits no discharge. Left eye exhibits no discharge. No scleral icterus.  Neck: Neck supple. No tracheal deviation present.  Cardiovascular: Normal rate, regular rhythm and intact distal pulses.   Pulmonary/Chest: Effort normal and breath sounds normal. No stridor. No  respiratory distress. He has no wheezes. He has no rales.  Abdominal: Soft. Bowel sounds are normal. He exhibits no distension. There is no tenderness. There is no rebound and no guarding.  Genitourinary:       Brown stool, normal rectal tone  Musculoskeletal: He exhibits edema. He exhibits no tenderness.       Edema bilateral lower extrem, nl pulses all 4 extremities; edema right hand,   Neurological: He is alert. He has normal strength. He is not disoriented. He displays tremor. No cranial nerve deficit ( no gross defecits noted) or sensory deficit. He exhibits normal muscle tone. He displays no seizure activity.       General weakness, weaker grip strength right hand, able to lift both legs of the bed but requires a lot of effort  Skin: Skin is warm and dry. No rash noted.  Psychiatric: He has a normal mood and affect.    ED  Course  Procedures (including critical care time)  Rate: 82  Rhythm: normal sinus rhythm  QRS Axis: normal  Intervals: nl  ST/T Wave abnormalities: normal  Conduction Disutrbances:left bundle branch block  Narrative Interpretation Old EKG Reviewed: unchanged   Labs Reviewed  PROTIME-INR - Abnormal; Notable for the following:    Prothrombin Time 16.8 (*)     All other components within normal limits  CBC - Abnormal; Notable for the following:    RBC 2.98 (*)     Hemoglobin 7.2 (*)     HCT 24.0 (*)     MCH 24.2 (*)     RDW 16.1 (*)     All other components within normal limits  COMPREHENSIVE METABOLIC PANEL - Abnormal; Notable for the following:    BUN 31 (*)     GFR calc non Af Amer 61 (*)     GFR calc Af Amer 71 (*)     All other components within normal limits  CK TOTAL AND CKMB - Abnormal; Notable for the following:    CK, MB 5.2 (*)     All other components within normal limits  POCT I-STAT, CHEM 8 - Abnormal; Notable for the following:    BUN 32 (*)     Calcium, Ion 1.33 (*)     Hemoglobin 9.5 (*)     HCT 28.0 (*)     All other components within normal limits  GLUCOSE, CAPILLARY  APTT  DIFFERENTIAL  TROPONIN I  URINALYSIS, ROUTINE W REFLEX MICROSCOPIC   Dg Chest 2 View  10/03/2011  *RADIOLOGY REPORT*  Clinical Data: Larey Seat several days ago with right-sided pain, weakness  CHEST - 2 VIEW  Comparison: Chest x-ray of 06/29/2010  Findings: The lungs are not optimally aerated but no infiltrate or effusion is seen.  The heart remains mildly enlarged.  The bones are osteopenic and there are degenerative changes diffusely throughout the thoracic spine.  IMPRESSION: Stable mild cardiomegaly.  No active lung disease.  Original Report Authenticated By: Juline Patch, M.D.   Ct Head Wo Contrast  10/03/2011  *RADIOLOGY REPORT*  Clinical Data: Recent fall, weakness  CT HEAD WITHOUT CONTRAST  Technique:  Contiguous axial images were obtained from the base of the skull through the  vertex without contrast.  Comparison: None.  Findings: The ventricular system is slightly prominent as are the cortical sulci and cerebellar folia, consistent with diffuse atrophy.  No hemorrhage, mass lesion, or acute infarction is seen. On bone window images, no calvarial abnormality is seen.  IMPRESSION: Atrophy.  No  acute intracranial abnormality.  Original Report Authenticated By: Juline Patch, M.D.     MDM  The patient presents with aggressive generalized weakness associated with falls he does not appear to have evidence of an acute injury today. His anemia is worsening and he may be dehydrated. The patient will be admitted to the hospital for further treatment and evaluation. He may benefit from social work assessment of his living situation.        Celene Kras, MD 10/03/11 [phone]
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
## 2011-10-03 NOTE — ED Notes (Signed)
Pt was just ambulated in hall by nurse tech. Admitting md wants anemia panel drawn before blood given

## 2011-10-03 NOTE — ED Notes (Signed)
MD at bedside. 

## 2011-10-03 NOTE — Progress Notes (Signed)
ED CM Contacted by Dr Ashley Royalty to assist with review of EPIC notes, labs and imaging. Discussed admission status.  Cm spoke with Dr Lynelle Doctor Cm confirmed with Dr Ashley Royalty that further labs are to be completed

## 2011-10-03 NOTE — ED Notes (Signed)
Patient states feels tired after walking. Patient wants to complete orthostatic vitals at a later time. Patient states we can draw blood but after he wants to eat.
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
## 2011-10-03 NOTE — ED Notes (Signed)
Bed:WHALA<BR> Expected date:<BR> Expected time:<BR> Means of arrival:Ambulance<BR> Comments:<BR> Hall A- Multiple falls, generalized weakness, does not ambulate well

## 2011-10-04 ENCOUNTER — Encounter (HOSPITAL_COMMUNITY): Payer: Self-pay | Admitting: Internal Medicine

## 2011-10-04 DIAGNOSIS — M129 Arthropathy, unspecified: Secondary | ICD-10-CM

## 2011-10-04 LAB — BASIC METABOLIC PANEL
CO2: 23 mEq/L (ref 19–32)
Calcium: 9.6 mg/dL (ref 8.4–10.5)
Creatinine, Ser: 1.12 mg/dL (ref 0.50–1.35)
GFR calc Af Amer: 75 mL/min — ABNORMAL LOW (ref >90)
GFR calc non Af Amer: 65 mL/min — ABNORMAL LOW (ref >90)
Sodium: 138 mEq/L (ref 135–145)

## 2011-10-04 LAB — CBC
MCH: 24.6 pg — ABNORMAL LOW (ref 26.0–34.0)
MCV: 80.2 fL (ref 78.0–100.0)
Platelets: 243 10*3/uL (ref 150–400)
RBC: 3.29 MIL/uL — ABNORMAL LOW (ref 4.22–5.81)
RDW: 16.1 % — ABNORMAL HIGH (ref 11.5–15.5)
WBC: 5.9 10*3/uL (ref 4.0–10.5)

## 2011-10-04 LAB — TYPE AND SCREEN
Antibody Screen: NEGATIVE
Unit division: 0

## 2011-10-04 LAB — FOLATE: Folate: 12.2 ng/mL

## 2011-10-04 LAB — GLUCOSE, CAPILLARY
Glucose-Capillary: 118 mg/dL — ABNORMAL HIGH (ref 70–99)
Glucose-Capillary: 160 mg/dL — ABNORMAL HIGH (ref 70–99)

## 2011-10-04 LAB — HEMOGLOBIN AND HEMATOCRIT, BLOOD
HCT: 25.1 % — ABNORMAL LOW (ref 39.0–52.0)
Hemoglobin: 7.9 g/dL — ABNORMAL LOW (ref 13.0–17.0)

## 2011-10-04 LAB — IRON AND TIBC
Iron: 21 ug/dL — ABNORMAL LOW (ref 42–135)
UIBC: 342 ug/dL (ref 125–400)

## 2011-10-04 LAB — VITAMIN B12: Vitamin B-12: 595 pg/mL (ref 211–911)

## 2011-10-04 LAB — HEMOGLOBIN A1C: Hgb A1c MFr Bld: 6.1 % — ABNORMAL HIGH (ref <5.7)

## 2011-10-04 MED ORDER — HYDROCODONE-ACETAMINOPHEN 5-500 MG PO TABS
1.0000 | ORAL_TABLET | Freq: Four times a day (QID) | ORAL | Status: DC | PRN
  Administered 2011-10-04: 1 via ORAL

## 2011-10-04 MED ORDER — SALINE SPRAY 0.65 % NA SOLN
1.0000 | NASAL | Status: DC | PRN
  Administered 2011-10-04 – 2011-10-06 (×2): 1 via NASAL
  Filled 2011-10-04: qty 44

## 2011-10-04 MED ORDER — MORPHINE SULFATE 2 MG/ML IJ SOLN
1.0000 mg | INTRAMUSCULAR | Status: DC | PRN
Start: 2011-10-04 — End: 2011-10-07
  Administered 2011-10-05 – 2011-10-07 (×5): 1 mg via INTRAVENOUS
  Filled 2011-10-04 (×5): qty 1

## 2011-10-04 MED ORDER — FLUTICASONE PROPIONATE 50 MCG/ACT NA SUSP
2.0000 | Freq: Every day | NASAL | Status: DC
  Filled 2011-10-04: qty 16

## 2011-10-04 MED ORDER — GLIMEPIRIDE 1 MG PO TABS
1.0000 mg | ORAL_TABLET | ORAL | Status: AC
  Administered 2011-10-04: 1 mg via ORAL

## 2011-10-04 MED ORDER — METFORMIN HCL 500 MG PO TABS
1000.0000 mg | ORAL_TABLET | Freq: Two times a day (BID) | ORAL | Status: DC
  Administered 2011-10-04 – 2011-10-07 (×7): 1000 mg via ORAL

## 2011-10-04 MED ORDER — HYDROCODONE-ACETAMINOPHEN 5-500 MG PO TABS: 1.0000 | ORAL_TABLET | ORAL | Status: DC | PRN

## 2011-10-04 MED ORDER — SIMVASTATIN 20 MG PO TABS
20.0000 mg | ORAL_TABLET | Freq: Once | ORAL | Status: AC
  Administered 2011-10-04: 20 mg via ORAL
  Filled 2011-10-04: qty 1

## 2011-10-04 MED ORDER — HYDROCODONE-ACETAMINOPHEN 5-325 MG PO TABS
1.0000 | ORAL_TABLET | ORAL | Status: DC | PRN
  Administered 2011-10-04 – 2011-10-07 (×10): 1 via ORAL
  Filled 2011-10-04 (×10): qty 1

## 2011-10-04 MED ORDER — CAPTOPRIL 25 MG PO TABS
25.0000 mg | ORAL_TABLET | ORAL | Status: AC
  Administered 2011-10-04: 25 mg via ORAL

## 2011-10-04 MED ORDER — ACARBOSE 25 MG PO TABS
25.0000 mg | ORAL_TABLET | Freq: Three times a day (TID) | ORAL | Status: DC
  Administered 2011-10-04 – 2011-10-07 (×11): 25 mg via ORAL
  Filled 2011-10-04: qty 1

## 2011-10-04 MED ORDER — FLUTICASONE PROPIONATE 50 MCG/ACT NA SUSP
2.0000 | Freq: Every day | NASAL | Status: DC
  Administered 2011-10-04 – 2011-10-07 (×4): 2 via NASAL
  Filled 2011-10-04: qty 16

## 2011-10-04 NOTE — Evaluation (Signed)
Occupational Therapy Evaluation Patient Details Name: [name] MRN: [id number] DOB: [birth year] Today's Date: 10/04/2011 Time: 4098-1191 OT Time Calculation (min): 27 min  OT Assessment / Plan / Recommendation Clinical Impression  This 71 year old man was admitted with anemia.  He has had frequent falls recently with knees buckling.  Pt also presents with RUE with limited use and swelling.  His wife can provide only limited assistance.  Pt presents with decreased activity tolerance, strength, RUE use, and balance which impair ADLs.  He is appropriate for skilled OT in acute with min A to min guard level goals.      OT Assessment  Patient needs continued OT Services    Follow Up Recommendations  Skilled nursing facility    Barriers to Discharge Decreased caregiver support    Equipment Recommendations  Defer to next venue    Recommendations for Other Services    Frequency  Min 2X/week    Precautions / Restrictions Precautions Precautions: Fall Restrictions Weight Bearing Restrictions: No   Pertinent Vitals/Pain Pt has hip, back, neck and RUE pain--adapted activity to minimize pain:  Not rated    ADL  Eating/Feeding: Simulated;Set up Where Assessed - Eating/Feeding: Chair Grooming: Simulated;Minimal assistance (open containers) Where Assessed - Grooming: Unsupported sitting Upper Body Bathing: Simulated;Set up Where Assessed - Upper Body Bathing: Unsupported sitting Lower Body Bathing: Simulated;Moderate assistance Where Assessed - Lower Body Bathing: Supported sit to stand Upper Body Dressing: Simulated;Minimal assistance Where Assessed - Upper Body Dressing: Unsupported sitting Lower Body Dressing: Simulated;Maximal assistance Where Assessed - Lower Body Dressing: Supported sit to stand Toilet Transfer: Simulated;+2 Total assistance Toilet Transfer: Patient Percentage:  (75%--+2 for safety:  recent falls/knees buckling) Toilet Transfer Method: Stand pivot Product/process development scientist:  (bed to chair) Toileting - Clothing Manipulation and Hygiene: Simulated;Moderate assistance Where Assessed - Toileting Clothing Manipulation and Hygiene: Sit to stand from 3-in-1 or toilet Transfers/Ambulation Related to ADLs: spt only.  Pt has wooden shoe for LLE.  No shoe for R.  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 ADL Comments: Can reach L ankle but not right.  Mutliple orthopedic problems    OT Diagnosis: Generalized weakness  OT Problem List: Decreased strength;Decreased range of motion;Decreased activity tolerance;Impaired balance (sitting and/or standing);Impaired UE functional use;Decreased coordination;Decreased knowledge of use of DME or AE;Increased edema OT Treatment Interventions: Self-care/ADL training;Balance training;Patient/family education;Therapeutic activities;DME and/or AE instruction   OT Goals Acute Rehab OT Goals OT Goal Formulation: With patient Time For Goal Achievement: 10/18/11 Potential to Achieve Goals: Good ADL Goals Pt Will Perform Lower Body Bathing: with min assist;Sit to stand from chair;with adaptive equipment ADL Goal: Lower Body Bathing - Progress: Goal set today Pt Will Perform Lower Body Dressing: with mod assist;Sit to stand from chair;with adaptive equipment ADL Goal: Lower Body Dressing - Progress: Goal set today Pt Will Transfer to Toilet: with min assist;Ambulation;3-in-1 (min guard) ADL Goal: Toilet Transfer - Progress: Goal set today Pt Will Perform Toileting - Clothing Manipulation: with min assist;Sitting on 3-in-1 or toilet;Standing ADL Goal: Toileting - Clothing Manipulation - Progress: Goal set today Pt Will Perform Toileting - Hygiene: with min assist;Sit to stand from 3-in-1/toilet ADL Goal: Toileting - Hygiene - Progress: Goal set today Arm Goals Additional Arm Goal #1: Pt will direct others to position RUE to control edema Arm Goal: Additional Goal #1 - Progress: Goal set today  Visit Information  Last OT Received On:  10/04/11 Assistance Needed: +2 (for safety) PT/OT Co-Evaluation/Treatment: Yes    Subjective Data  Subjective: I have something going on with my rotator cuff and nerve.  They told me not to use my arm much Patient Stated Goal: home   Prior Functioning  Home Living Lives With: Spouse;Other (Comment) (wife is amputee) Available Help at Discharge: Other (Comment) (doesn't have a lot of support beyond wife) Type of Home: House Home Access: Stairs to enter [employer] of Steps: 3 Home Layout: One level Bathroom Shower/Tub: Tub/shower unit;Other (comment) (stool and grab bar) Bathroom Toilet: Standard Prior Function Level of Independence: Independent with assistive device(s) (no housework; can usually put shoes on) Able to Take Stairs?: Yes Driving: No Comments: [place] Communication Communication: HOH (very talkative) Dominant Hand: Left    Cognition  Overall Cognitive Status: Appears within functional limits for tasks assessed/performed Arousal/Alertness: Awake/alert Orientation Level: Oriented X4 / Intact Behavior During Session: [facility] for tasks performed    Extremity/Trunk Assessment Right Upper Extremity Assessment RUE ROM/Strength/Tone: Deficits RUE ROM/Strength/Tone Deficits: can lift arm to 50 degrees and can move elbow and wrist. Forearm and  Fingers are swollen--very little movement in fingers; edema is pitting.    Pt has problem with Rotator cuff and nerve per pt.  Impaired sensation fingers to elbow.  Pt has said that Dr Lajoyce Corners told him not to use arm much:  Have not seen anything after 3/13.  History states that neuro is following pt.  Will write sticky note to see if moving fingers would be contraindicated at all--pt feels he shouldn't.  He did demonstrate above movement on his own.   RUE Sensation: Deficits Left Upper Extremity Assessment LUE ROM/Strength/Tone: Within functional levels (L dominant.  Finger tips with impaired sensation) LUE Sensation:  Deficits   Mobility Transfers Transfers: Sit to Stand Sit to Stand: 4: Min assist   Exercise    Balance    End of Session OT - End of Session Equipment Utilized During Treatment:  (RW) Activity Tolerance: Patient limited by pain;Other (comment) (no shoe for L foot to walk ) Patient left: in chair;with call bell/phone within reach;with family/visitor present  GO     Kaelum Kissick 10/04/2011, 10:28 AM Marica Otter, OTR/L [phone] 10/04/2011
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
## 2011-10-04 NOTE — Progress Notes (Signed)
Chart reviewed.  Pt from home. PT/OT to eval, will help in discharge dispostion. mp

## 2011-10-04 NOTE — Progress Notes (Signed)
Patient ID: [name], male   DOB: 1940-11-03, 71 y.o.   MRN: [id number]  [facility] PROGRESS NOTE  ADVITH MARTINE WUJ:[id number] DOB: 02/07/1941 DOA: 10/03/2011 PCP: No primary provider on file.  Brief narrative: Pt is26 year old gentleman who presented to [facility] with main concern of frequent falls in the last several months, with no specific associated dizziness. He reports falling 3 times the day prior to admission and was in the past recommended to go to rehabilitation facility but he prefers to have it done in outpatient setting.   Assessment/Plan:  Active Problems:  Anemia - Hg is slightly improved since yesterday, he is status post 1 unit PRBC transfusion - will obtain CBC in AM - review of previous record indicate normocytic anemia most consistent with iron deficiency anemia - pt denies any evident bleeding in urine or stool, FOBT negative  - anemia panel pending   Falls frequently - no clear reason for recurrent falls - noted pt is on Glimepiride and I wander if he gets at occasions hypoglycemic - will monitor CBG while in hospital - PT evaluation done and recommends SNF - check TSH, RPR, vitamin B12   Diabetes mellitus type 2 with complications, uncontrolled, with complications, neuropathy - follow upon A1C - continue to monitor CBG on sliding scale insulin and glimepiride, metformin   HTN (hypertension) - continue to monitor vitals per floor protocol - continue Captopril  - will readjust the regimen as indicated  Consultants:  Physical therapy  Antibiotics:  None  Code Status: Full code Family Communication: Pt at bedside Disposition Plan: SNF when bed available  Manson Passey, MD  [facility] Pager [phone]  If 7PM-7AM, please contact night-coverage [email or web address] Password [facility] 10/04/2011, 6:16 PM   LOS: 1 day   HPI/Subjective: No events overnight. Pt reports pain is better controlled.   Objective: Filed  Vitals:   10/04/11 0055 10/04/11 0120 10/04/11 0656 10/04/11 1410  BP: 161/86 160/87 155/71 166/86  Pulse: 95 95 95 83  Temp: 98.1 F (36.7 C) 97.9 F (36.6 C) 98.3 F (36.8 C) 98.2 F (36.8 C)  TempSrc: Oral Oral Oral Oral  Resp: 18 18 16 20   Height:      Weight:      SpO2:   95% 99%    Intake/Output Summary (Last 24 hours) at 10/04/11 1816 Last data filed at 10/04/11 1600  Gross per 24 hour  Intake  512.5 ml  Output    300 ml  Net  212.5 ml    Exam:   General:  Pt is alert, follows commands appropriately, not in acute distress  Cardiovascular: Regular rate and rhythm, S1/S2, no murmurs, no rubs, no gallops  Respiratory: Clear to auscultation bilaterally, no wheezing, no crackles, no rhonchi  Abdomen: Soft, non tender, non distended, bowel sounds present, no guarding  Extremities: No edema, pulses DP and PT palpable bilaterally  Neuro: Grossly nonfocal  Data Reviewed: Basic Metabolic Panel:  Lab 10/04/11 8657 10/03/11 1625 10/03/11 1600  NA 138 145 142  K 4.0 4.3 4.2  CL 106 111 108  CO2 23 -- 21  GLUCOSE 186* 95 93  BUN 28* 32* 31*  CREATININE 1.12 1.30 1.17  CALCIUM 9.6 -- 10.0  MG -- -- --  PHOS -- -- --   Liver Function Tests:  Lab 10/03/11 1600  AST 24  ALT 19  ALKPHOS 94  BILITOT 0.3  PROT 7.1  ALBUMIN 3.7   CBC:  Lab 10/04/11 1035 10/04/11 0300 10/03/11  1840 10/03/11 1625 10/03/11 1600  WBC 5.9 -- -- -- 6.3  NEUTROABS -- -- -- -- 4.5  HGB 8.1* 7.9* 7.0* 9.5* 7.2*  HCT 26.4* 25.1* 22.9* 28.0* 24.0*  MCV 80.2 -- -- -- 80.5  PLT 243 -- -- -- 249   Cardiac Enzymes:  Lab 10/03/11 1600  CKTOTAL 94  CKMB 5.2*  CKMBINDEX --  TROPONINI <0.30   CBG:  Lab 10/04/11 1722 10/04/11 1142 10/04/11 0829 10/03/11 2226 10/03/11 1456  GLUCAP 112* 160* 118* 137* 94    Studies: Dg Chest 2 View  10/03/2011  *RADIOLOGY REPORT*  Clinical Data: Larey Seat several days ago with right-sided pain, weakness  CHEST - 2 VIEW  Comparison: Chest x-ray of  06/29/2010  Findings: The lungs are not optimally aerated but no infiltrate or effusion is seen.  The heart remains mildly enlarged.  The bones are osteopenic and there are degenerative changes diffusely throughout the thoracic spine.  IMPRESSION: Stable mild cardiomegaly.  No active lung disease.  Original Report Authenticated By: Juline Patch, M.D.   Ct Head Wo Contrast  10/03/2011  *RADIOLOGY REPORT*  Clinical Data: Recent fall, weakness  CT HEAD WITHOUT CONTRAST  Technique:  Contiguous axial images were obtained from the base of the skull through the vertex without contrast.  Comparison: None.  Findings: The ventricular system is slightly prominent as are the cortical sulci and cerebellar folia, consistent with diffuse atrophy.  No hemorrhage, mass lesion, or acute infarction is seen. On bone window images, no calvarial abnormality is seen.  IMPRESSION: Atrophy.  No acute intracranial abnormality.  Original Report Authenticated By: Juline Patch, M.D.    Scheduled Meds:   . acarbose  25 mg Oral TID WC  . captopril  25 mg Oral TID  . captopril  25 mg Oral NOW  . cholecalciferol  2,000 Units Oral Daily  . fluticasone  2 spray Each Nare Daily  . glimepiride  1 mg Oral BID  . glimepiride  1 mg Oral NOW  . insulin aspart  0-5 Units Subcutaneous QHS  . insulin aspart  0-9 Units Subcutaneous TID WC  . metFORMIN  1,000 mg Oral BID WC  . senna  1 tablet Oral BID  . simvastatin  20 mg Oral QPM  . simvastatin  20 mg Oral Once  . DISCONTD: acarbose  50 mg Oral TID WC  . DISCONTD: fluticasone  2 spray Each Nare Daily  . DISCONTD: metFORMIN  1,000 mg Oral QPC supper  . DISCONTD: metFORMIN  500 mg Oral Q breakfast  . DISCONTD: metFORMIN  500-1,000 mg Oral BID  . DISCONTD: sodium chloride  3 mL Intravenous STAT   Continuous Infusions:

## 2011-10-04 NOTE — Progress Notes (Signed)
CSW was informed that nursing staff were having to care for two patients in 1334. Concerned about patient and guest care, CSW sought out contact information for the patient and guest. CSW was given information on several friends [name]; C: [phone], H: [phone]; [name], C:  [phone] and [name], H: [phone], C: [phone]. The guest was dropped off by a son, [name], however, the patient or quest can not remember his number. However they recall that Satira Sark is married to [name], lives on [address] and work ar [employer].  Over all concern has to deal with, the quest having braces on both feet, not ambulating without a walker and nurses are having to care for a second patient who at the moment is not meeting any kind of admission criteria.   Kayleen Memos. Leighton Ruff [phone]

## 2011-10-05 ENCOUNTER — Inpatient Hospital Stay (HOSPITAL_COMMUNITY): Payer: Medicare Other

## 2011-10-05 DIAGNOSIS — I872 Venous insufficiency (chronic) (peripheral): Secondary | ICD-10-CM | POA: Diagnosis present

## 2011-10-05 DIAGNOSIS — E785 Hyperlipidemia, unspecified: Secondary | ICD-10-CM | POA: Diagnosis present

## 2011-10-05 DIAGNOSIS — G90511 Complex regional pain syndrome I of right upper limb: Secondary | ICD-10-CM | POA: Diagnosis present

## 2011-10-05 LAB — GLUCOSE, CAPILLARY: Glucose-Capillary: 122 mg/dL — ABNORMAL HIGH (ref 70–99)

## 2011-10-05 LAB — CBC
HCT: 24.7 % — ABNORMAL LOW (ref 39.0–52.0)
Hemoglobin: 7.6 g/dL — ABNORMAL LOW (ref 13.0–17.0)
MCH: 24.8 pg — ABNORMAL LOW (ref 26.0–34.0)
MCHC: 30.8 g/dL (ref 30.0–36.0)
MCV: 80.5 fL (ref 78.0–100.0)
RBC: 3.07 MIL/uL — ABNORMAL LOW (ref 4.22–5.81)

## 2011-10-05 LAB — BASIC METABOLIC PANEL
BUN: 23 mg/dL (ref 6–23)
CO2: 22 mEq/L (ref 19–32)
Calcium: 9.4 mg/dL (ref 8.4–10.5)
Creatinine, Ser: 1.1 mg/dL (ref 0.50–1.35)
GFR calc non Af Amer: 66 mL/min — ABNORMAL LOW (ref >90)
Glucose, Bld: 93 mg/dL (ref 70–99)
Sodium: 137 mEq/L (ref 135–145)

## 2011-10-05 MED ORDER — SODIUM CHLORIDE 0.9 % IV SOLN
500.0000 mg | Freq: Every day | INTRAVENOUS | Status: AC
  Administered 2011-10-05 – 2011-10-06 (×2): 500 mg via INTRAVENOUS
  Filled 2011-10-05 (×3): qty 10

## 2011-10-05 MED ORDER — SODIUM CHLORIDE 0.9 % IV SOLN
25.0000 mg | Freq: Once | INTRAVENOUS | Status: AC
  Administered 2011-10-05: 25 mg via INTRAVENOUS
  Filled 2011-10-05: qty 0.5

## 2011-10-05 NOTE — Progress Notes (Signed)
Occupational Therapy Treatment Patient Details Name: [name] MRN: [id number] DOB: 12-16-1940 Today's Date: 10/05/2011 Time: 4098-1191 OT Time Calculation (min): 28 min  OT Assessment / Plan / Recommendation Comments on Treatment Session Pt reports increased difficulty with LUE numbness.  Having difficulty feeding self:  new goal added.  Will provide AE as soon as possible.  Pt did not recall positioning for RUE:  positioned up on pillows at end of session.    Follow Up Recommendations  Skilled nursing facility    Barriers to Discharge       Equipment Recommendations  Defer to next venue    Recommendations for Other Services    Frequency Min 2X/week   Plan      Precautions / Restrictions Precautions Precautions: Fall Required Braces or Orthoses: Other Brace/Splint Other Brace/Splint: has a R  foot postop type shoe Restrictions Weight Bearing Restrictions: No   Pertinent Vitals/Pain No c/o pain    ADL  Upper Body Bathing: Performed;Minimal assistance Where Assessed - Upper Body Bathing: Unsupported sitting Lower Body Bathing: Performed;Moderate assistance Where Assessed - Lower Body Bathing: Supported sit to stand Upper Body Dressing: Performed;Minimal assistance Where Assessed - Upper Body Dressing: Unsupported sitting Transfers/Ambulation Related to ADLs: min guard for sit to stand from elevated bed.  Side stepped up bed with min A ADL Comments: Pt reports increased numbness in LUE.  Had difficulty holding onto washcloth--slipped back in hand and fingers were rubbing against body.  Will benefit from long sponge, washmitt and built up foam for toothbrush as well as utensils.      OT Diagnosis:    OT Problem List:   OT Treatment Interventions:     OT Goals Acute Rehab OT Goals Time For Goal Achievement: 10/18/11 ADL Goals Pt Will Perform Eating: with set-up;Sitting, chair;Supported;with adaptive utensils (with full set up to open containers) ADL Goal: Eating  - Progress: Goal set today Pt Will Perform Lower Body Bathing: with min assist;Sit to stand from chair;with adaptive equipment ADL Goal: Lower Body Bathing - Progress: Progressing toward goals Arm Goals Additional Arm Goal #1: Pt will direct others to position RUE to control edema Arm Goal: Additional Goal #1 - Progress: Progressing toward goals  Visit Information  Last OT Received On: 10/05/11 Assistance Needed: +1 (for sit to stand)    Subjective Data      Prior Functioning       Cognition  Overall Cognitive Status: Appears within functional limits for tasks assessed/performed Behavior During Session: [facility] for tasks performed    Mobility Bed Mobility Sit to Supine: 4: Min assist Transfers Sit to Stand: 4: Min guard;From elevated surface;From bed;With upper extremity assist   Exercises    Balance    End of Session OT - End of Session Activity Tolerance: Patient tolerated treatment well  GO     Sherlyne Crownover 10/05/2011, 11:21 AM Marica Otter, OTR/L [phone] 10/05/2011

## 2011-10-05 NOTE — Progress Notes (Signed)
PROGRESS NOTE  [name] QIO:[id number] DOB: 1941-01-22 DOA: 10/03/2011 PCP: No primary provider on file.  Brief narrative: [name] is a 71 year old man who was admitted on 10/03/2011 with frequent falls at home. He has been under the care of Dr. Terrace Arabia, his neurologist as well as Dr. Lajoyce Corners, his orthopedic physician. Upon initial evaluation emergency department, the patient was noted to be anemic with a hemoglobin of 7 mg/dL.  Interim History: Stable overnight.  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 Atrophy but no acute intracranial process noted.  Status post physical and occupational therapy evaluations. Skilled nursing home placement recommended.  MRI results from 09/01/2010 reviewed and consistent with spinal stenosis, which is likely the underlying etiology of his gait abnormality and frequent falls.   Active Problems:  Normocytic anemia  No evidence of GI blood loss, fecal occult blood testing was negative.  Noted to be chronic on records review.  Received one unit of packed red blood cells 10/03/2011.  Anemia panel consistent with iron deficiency.  Will give iron replacement.  Diabetes mellitus type 2 with complications, controlled  Hemoglobin A1c 6.1, indicating good control.  CBGs 83-160 over past 24 hours.  Continue current therapy including acarbose, metformin, Amaryl, and insulin sensitive sliding scale.  HTN (hypertension)  Reasonable control though slightly suboptimal, on captopril.  Monitor and adjust blood pressure medications if indicated.  Venous insufficiency  Place TED hose.  Complex regional pain syndrome of right upper extremity suspicious for neuropathy  MRI of the cervical spine from 12/22/2008 reviewed. Multilevel disc disease noted.  Apparently is under the care of Dr. Terrace Arabia of neurology and Dr. Lajoyce Corners of orthopedics for this.    Had  outpatient MRI brain done by Dr. Terrace Arabia (office faxed results to hospital: Reviewed, negative). EMG studies ordered but never completed by patient, per office notes.  Spoke with Dr. Lajoyce Corners and he recommended ordering a MRI of the cervical spine and a Doppler of the right upper extremity for further evaluation.  Hyperlipidemia  Continue statin therapy.   Code Status: Full  Family Communication: Mrs. Garlow [phone]; None at bedside. Disposition Plan: Home versus SNF.  Medical Consultants:  None.  Telephone consultation made with Dr. Lajoyce Corners.  Other consultants:  Physical therapy:  Antibiotics:  None   Subjective  [name] complains of significant right upper extremity discomfort and swelling. His appetite is good. He remains weak in the legs with pain in the right lower extremity.   Objective   Objective: Filed Vitals:   10/04/11 0656 10/04/11 1410 10/04/11 2056 10/05/11 0549  BP: 155/71 166/86 160/84 149/83  Pulse: 95 83 90 91  Temp: 98.3 F (36.8 C) 98.2 F (36.8 C) 98.2 F (36.8 C) 97.8 F (36.6 C)  TempSrc: Oral Oral Oral Oral  Resp: 16 20 16 18   Height:      Weight:      SpO2: 95% 99% 100% 100%    Intake/Output Summary (Last 24 hours) at 10/05/11 1249 Last data filed at 10/05/11 1000  Gross per 24 hour  Intake    480 ml  Output   1100 ml  Net   -620 ml    Exam: Gen:  NAD Cardiovascular:  RRR, No M/R/G Respiratory: Lungs CTAB Gastrointestinal: Abdomen soft, NT/ND with normal active bowel sounds. Extremities: 3+ RUE swelling, BLE 2+ swelling, R>L.    Data Reviewed: Basic Metabolic Panel:  Lab 10/05/11 5366 10/04/11 1035  10/03/11 1625 10/03/11 1600  NA 137 138 145 142  K 4.2 4.0 -- --  CL 107 106 111 108  CO2 22 23 -- 21  GLUCOSE 93 186* 95 93  BUN 23 28* 32* 31*  CREATININE 1.10 1.12 1.30 1.17  CALCIUM 9.4 9.6 -- 10.0  MG -- -- -- --  PHOS -- -- -- --   GFR Estimated Creatinine Clearance: 63.6 ml/min (by C-G formula based on Cr of  1.1). Liver Function Tests:  Lab 10/03/11 1600  AST 24  ALT 19  ALKPHOS 94  BILITOT 0.3  PROT 7.1  ALBUMIN 3.7   Coagulation profile  Lab 10/03/11 1600  INR 1.34  PROTIME --    CBC:  Lab 10/05/11 0327 10/04/11 1035 10/04/11 0300 10/03/11 1840 10/03/11 1625 10/03/11 1600  WBC 7.0 5.9 -- -- -- 6.3  NEUTROABS -- -- -- -- -- 4.5  HGB 7.6* 8.1* 7.9* 7.0* 9.5* --  HCT 24.7* 26.4* 25.1* 22.9* 28.0* --  MCV 80.5 80.2 -- -- -- 80.5  PLT 245 243 -- -- -- 249   Cardiac Enzymes:  Lab 10/03/11 1600  CKTOTAL 94  CKMB 5.2*  CKMBINDEX --  TROPONINI <0.30   CBG:  Lab 10/05/11 1241 10/05/11 0834 10/04/11 2148 10/04/11 1722 10/04/11 1142  GLUCAP 99 88 83 112* 160*   Hgb A1c  Basename 10/03/11 1600  HGBA1C 6.1*   Thyroid function studies  Basename 10/04/11 1600  TSH 1.661  T4TOTAL --  T3FREE --  THYROIDAB --   Anemia work up  [employer] 10/03/11 1955  VITAMINB12 595  FOLATE 12.2  FERRITIN 18*  TIBC 363  IRON 21*  RETICCTPCT 1.8   Microbiology No results found for this or any previous visit (from the past 240 hour(s)).  Procedures and Diagnostic Studies:  Dg Chest 2 View 10/03/2011 IMPRESSION: Stable mild cardiomegaly.  No active lung disease.  Original Report Authenticated By: Juline Patch, M.D.    Ct Head Wo Contrast 10/03/2011 IMPRESSION: Atrophy.  No acute intracranial abnormality.  Original Report Authenticated By: Juline Patch, M.D.    Scheduled Meds:    . acarbose  25 mg Oral TID WC  . captopril  25 mg Oral TID  . cholecalciferol  2,000 Units Oral Daily  . fluticasone  2 spray Each Nare Daily  . glimepiride  1 mg Oral BID  . insulin aspart  0-5 Units Subcutaneous QHS  . insulin aspart  0-9 Units Subcutaneous TID WC  . metFORMIN  1,000 mg Oral BID WC  . senna  1 tablet Oral BID  . simvastatin  20 mg Oral QPM   Continuous Infusions:     LOS: 2 days   Hillery Aldo, MD Pager [phone]  10/05/2011, 12:49 PM

## 2011-10-05 NOTE — Progress Notes (Signed)
CSW received phone call confirming patients son, [name], phone number [phone]. Jacquiline Zurcher C. Lluvia Gwynne MSW, LCSW [phone]

## 2011-10-05 NOTE — Progress Notes (Signed)
Occupational Therapy Treatment Patient Details Name: JAHMEIR GEISEN MRN: [id number] DOB: 1940/07/27 Today's Date: 10/05/2011 Time: 3086-5784 OT Time Calculation (min): 18 min  OT Assessment / Plan / Recommendation Comments on Treatment Session     Follow Up Recommendations  Skilled nursing facility    Barriers to Discharge       Equipment Recommendations  Defer to next venue    Recommendations for Other Services    Frequency Min 2X/week   Plan      Precautions / Restrictions Precautions Precautions: Fall Required Braces or Orthoses: Other Brace/Splint Other Brace/Splint: has a R  foot postop type shoe Restrictions Weight Bearing Restrictions: No   Pertinent Vitals/Pain Neck:  Moderate.  Called for medicine    ADL  Grooming: Performed;Teeth care;Min guard (with built up ) Where Assessed - Grooming: Supported standing Toilet Transfer: Performed;Moderate assistance (sit to stand from comfort height commode) Transfers/Ambulation Related to ADLs: pt was on commode:  ambulated back to bed with Min A.  Stood to brush teeth ADL Comments: Educated on washmitt and built up foam.  Used foam on toothbrush. Pt having great difficulty manipulating regular styrofoam cup; ordered 2 coffee cups from kitchen to use in room    OT Diagnosis:    OT Problem List:   OT Treatment Interventions:     OT Goals Acute Rehab OT Goals Time For Goal Achievement: 10/18/11 ADL Goals Pt Will Perform Eating: with set-up;Sitting, chair;Supported;with adaptive utensils ADL Goal: Eating - Progress: Progressing toward goals Pt Will Transfer to Toilet: with min assist;Ambulation;3-in-1 ADL Goal: Toilet Transfer - Progress: Progressing toward goals Arm Goals Additional Arm Goal #1: Pt will direct others to position RUE to control edema Arm Goal: Additional Goal #1 - Progress: Progressing toward goals  Visit Information  Last OT Received On: 10/05/11 Assistance Needed: +1    Subjective Data        Prior Functioning       Cognition  Overall Cognitive Status:  (a little difficulty with memory:  wanted pain meds and forgo) Behavior During Session: [facility] for tasks performed    Mobility Bed Mobility Sit to Supine: 4: Min assist Transfers Sit to Stand: 3: Mod assist;From toilet   Exercises    Balance    End of Session OT - End of Session Activity Tolerance: Patient tolerated treatment well Patient left: in bed;with call bell/phone within reach  GO     [facility] 10/05/2011, 11:53 AM Marica Otter, OTR/L [phone] 10/05/2011

## 2011-10-06 DIAGNOSIS — K5903 Drug induced constipation: Secondary | ICD-10-CM | POA: Diagnosis present

## 2011-10-06 DIAGNOSIS — M7989 Other specified soft tissue disorders: Secondary | ICD-10-CM

## 2011-10-06 DIAGNOSIS — M79609 Pain in unspecified limb: Secondary | ICD-10-CM

## 2011-10-06 DIAGNOSIS — I82A11 Acute embolism and thrombosis of right axillary vein: Secondary | ICD-10-CM | POA: Diagnosis present

## 2011-10-06 LAB — CBC
HCT: 28.1 % — ABNORMAL LOW (ref 39.0–52.0)
Hemoglobin: 8.5 g/dL — ABNORMAL LOW (ref 13.0–17.0)
RBC: 3.48 MIL/uL — ABNORMAL LOW (ref 4.22–5.81)
RDW: 16.7 % — ABNORMAL HIGH (ref 11.5–15.5)
WBC: 9.5 10*3/uL (ref 4.0–10.5)

## 2011-10-06 LAB — GLUCOSE, CAPILLARY
Glucose-Capillary: 76 mg/dL (ref 70–99)
Glucose-Capillary: 76 mg/dL (ref 70–99)

## 2011-10-06 MED ORDER — WARFARIN - PHARMACIST DOSING INPATIENT: Freq: Every day | Status: DC

## 2011-10-06 MED ORDER — WARFARIN VIDEO
Freq: Once | Status: AC
  Administered 2011-10-07: 12:00:00

## 2011-10-06 MED ORDER — PATIENT'S GUIDE TO USING COUMADIN BOOK
Freq: Once | Status: AC
  Administered 2011-10-06: 17:00:00
  Filled 2011-10-06: qty 1

## 2011-10-06 MED ORDER — WARFARIN SODIUM 7.5 MG PO TABS
7.5000 mg | ORAL_TABLET | Freq: Once | ORAL | Status: AC
  Administered 2011-10-06: 7.5 mg via ORAL
  Filled 2011-10-06: qty 1

## 2011-10-06 MED ORDER — PREDNISONE 20 MG PO TABS
40.0000 mg | ORAL_TABLET | Freq: Every day | ORAL | Status: DC
  Administered 2011-10-07: 40 mg via ORAL
  Filled 2011-10-06 (×2): qty 2

## 2011-10-06 MED ORDER — ENOXAPARIN SODIUM 100 MG/ML ~~LOC~~ SOLN
95.0000 mg | Freq: Two times a day (BID) | SUBCUTANEOUS | Status: DC
  Administered 2011-10-06 – 2011-10-07 (×3): 95 mg via SUBCUTANEOUS
  Filled 2011-10-06 (×5): qty 1

## 2011-10-06 MED ORDER — MAGNESIUM HYDROXIDE 400 MG/5ML PO SUSP
30.0000 mL | Freq: Every day | ORAL | Status: DC | PRN
  Administered 2011-10-07: 30 mL via ORAL
  Filled 2011-10-06: qty 30

## 2011-10-06 NOTE — Progress Notes (Signed)
VASCULAR LAB PRELIMINARY  PRELIMINARY  PRELIMINARY  PRELIMINARY  Right upper extremity venous duplex completed.    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                                 No evidence of superficial thrombosis.  Lachanda Buczek, RVS 10/06/2011, 2:18 PM

## 2011-10-06 NOTE — Progress Notes (Signed)
PROGRESS NOTE  [name] FAO:[id number] DOB: 06/03/1940 DOA: 10/03/2011 PCP: No primary provider on file.  Brief narrative: [name] is a 71 year old man who was admitted on 10/03/2011 with frequent falls at home. He has been under the care of Dr. Terrace Arabia, his neurologist as well as Dr. Lajoyce Corners, his orthopedic physician. Upon initial evaluation emergency department, the patient was noted to be anemic with a hemoglobin of 7 mg/dL.  Interim History: Stable overnight.  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 Atrophy but no acute intracranial process noted.  Status post physical and occupational therapy evaluations. Skilled nursing home placement recommended.  MRI results from 09/01/2010 reviewed and consistent with spinal stenosis, which is likely the underlying etiology of his gait abnormality and frequent falls.    Short course steroids ordered. Active Problems:  Constipation due to pain medication  MOM PRN.  Continue stool softeners.  Right upper extremity DVT (proximal brachial through distal axillary vein)  Doppler studies done 10/05/11 confirm DVT.  Start Lovenox/coumadin per pharmacy.  Normocytic anemia  No evidence of GI blood loss, fecal occult blood testing was negative.  Noted to be chronic on records review.  Received one unit of packed red blood cells 10/03/2011.  Anemia panel consistent with iron deficiency.  Given iron replacement IV on 10/05/11.    Hemoglobin stable.  Diabetes mellitus type 2 with complications, controlled  Hemoglobin A1c 6.1, indicating good control.  CBGs 83-160 over past 24 hours.  Continue current therapy including acarbose, metformin, Amaryl, and insulin sensitive sliding scale.  HTN (hypertension)  Reasonable control though slightly suboptimal, on captopril.  Monitor and adjust blood pressure medications if indicated.  Venous  insufficiency  Place TED hose.  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 Multilevel disc disease noted.  Apparently is under the care of Dr. Terrace Arabia of neurology and Dr. Lajoyce Corners of orthopedics for this.    Had outpatient MRI brain done by Dr. Terrace Arabia (office faxed results to hospital: Reviewed, negative). EMG studies ordered but never completed by patient, per office notes.  Spoke with Dr. Lajoyce Corners and he recommended ordering a MRI of the cervical spine and a Doppler of the right upper extremity for further evaluation.  MRI of C spine completed 10/05/11 which showed marked progression of cervical disc disease / spinal stenosis.  Trial short course steroids, 40 mg daily x 5 days ordered.  Continue pain medications as needed.  Hyperlipidemia  Continue statin therapy.   Code Status: Full  Family Communication: Mrs. [name] [phone]; None at bedside. Disposition Plan: Home versus SNF.  Medical Consultants:  None.  Telephone consultation made with Dr. Lajoyce Corners.  Other consultants:  Physical therapy:  Antibiotics:  None   Subjective  [name] complains of ongoing right upper extremity discomfort and swelling. His appetite is good. He remains weak in the legs with pain in the right lower extremity.  Complains of constipation.   Objective   Objective: Filed Vitals:   10/05/11 2108 10/05/11 2125 10/05/11 2220 10/06/11 0654  BP: 167/87 145/90 138/75 155/79  Pulse: 103 94  101  Temp: 98.1 F (36.7 C)   98.1 F (36.7 C)  TempSrc: Oral   Oral  Resp: 20   20  Height:      Weight:      SpO2: 99%  97%    Intake/Output Summary (Last 24 hours) at 10/06/11 1405 Last data filed at 10/06/11 7829  Gross per 24 hour  Intake    600 ml  Output    250 ml  Net    350 ml    Exam: Gen:  NAD Cardiovascular:  RRR, No M/R/G Respiratory: Lungs CTAB Gastrointestinal:  Abdomen soft, NT/ND with normal active bowel sounds. Extremities: 3+ RUE swelling, BLE 2+ swelling, R>L.    Data Reviewed: Basic Metabolic Panel:  Lab 10/05/11 5621 10/04/11 1035 10/03/11 1625 10/03/11 1600  NA 137 138 145 142  K 4.2 4.0 -- --  CL 107 106 111 108  CO2 22 23 -- 21  GLUCOSE 93 186* 95 93  BUN 23 28* 32* 31*  CREATININE 1.10 1.12 1.30 1.17  CALCIUM 9.4 9.6 -- 10.0  MG -- -- -- --  PHOS -- -- -- --   GFR Estimated Creatinine Clearance: 63.6 ml/min (by C-G formula based on Cr of 1.1). Liver Function Tests:  Lab 10/03/11 1600  AST 24  ALT 19  ALKPHOS 94  BILITOT 0.3  PROT 7.1  ALBUMIN 3.7   Coagulation profile  Lab 10/03/11 1600  INR 1.34  PROTIME --    CBC:  Lab 10/06/11 0324 10/05/11 0327 10/04/11 1035 10/04/11 0300 10/03/11 1840 10/03/11 1600  WBC 9.5 7.0 5.9 -- -- 6.3  NEUTROABS -- -- -- -- -- 4.5  HGB 8.5* 7.6* 8.1* 7.9* 7.0* --  HCT 28.1* 24.7* 26.4* 25.1* 22.9* --  MCV 80.7 80.5 80.2 -- -- 80.5  PLT 265 245 243 -- -- 249   Cardiac Enzymes:  Lab 10/03/11 1600  CKTOTAL 94  CKMB 5.2*  CKMBINDEX --  TROPONINI <0.30   CBG:  Lab 10/06/11 1207 10/06/11 0735 10/05/11 2038 10/05/11 1837 10/05/11 1703  GLUCAP 76 105* 112* 122* 96   Hgb A1c  Basename 10/03/11 1600  HGBA1C 6.1*   Thyroid function studies  Basename 10/04/11 1600  TSH 1.661  T4TOTAL --  T3FREE --  THYROIDAB --   Anemia work up  [employer] 10/03/11 1955  VITAMINB12 595  FOLATE 12.2  FERRITIN 18*  TIBC 363  IRON 21*  RETICCTPCT 1.8   Microbiology No results found for this or any previous visit (from the past 240 hour(s)).  Procedures and Diagnostic Studies:  Dg Chest 2 View 10/03/2011 IMPRESSION: Stable mild cardiomegaly.  No active lung disease.  Original Report Authenticated By: Juline Patch, M.D.    Ct Head Wo Contrast 10/03/2011 IMPRESSION: Atrophy.  No acute intracranial abnormality.  Original Report Authenticated By: Juline Patch, M.D.    Mr Cervical  Spine Wo Contrast 10/05/2011 IMPRESSION: Marked worsening of cervical spine degenerative disease since the previous study of September 2010.  There is severe spinal stenosis worse in descending order at C4-5, C5-6, C3-4, C6-7 and C7-T1.  At the C4-5 level, AP diameter of the canal is only 4 mm and the cord is markedly flattened.  There is early edematous change in the cord at this level. See above for details at each level.  Original Report Authenticated By: Thomasenia Sales, M.D.   Scheduled Meds:    . acarbose  25 mg Oral TID WC  . captopril  25 mg Oral TID  . cholecalciferol  2,000 Units Oral Daily  . fluticasone  2 spray Each Nare Daily  . glimepiride  1 mg Oral BID  . insulin aspart  0-5 Units Subcutaneous QHS  . insulin aspart  0-9  Units Subcutaneous TID WC  . iron dextran (INFED/DEXFERRUM) IVPB (TEST DOSE)  25 mg Intravenous Once  . iron dextran (INFED/DEXFERRRUM) 500 MG IVPB  500 mg Intravenous Daily  . metFORMIN  1,000 mg Oral BID WC  . senna  1 tablet Oral BID  . simvastatin  20 mg Oral QPM   Continuous Infusions:     LOS: 3 days   Hillery Aldo, MD Pager [phone]  10/06/2011, 2:05 PM

## 2011-10-06 NOTE — Progress Notes (Signed)
Physical Therapy Treatment Patient Details Name: [name] MRN: [id number] DOB: December 31, 1940 Today's Date: 10/06/2011 Time: 4098-1191 PT Time Calculation (min): 14 min  PT Assessment / Plan / Recommendation Comments on Treatment Session  pt continues w/ significant dysfunction of RUE r/t  edema, unable to grip. RW.  Pt clearly unable to care for self at this time. rec. SNF    Follow Up Recommendations  Skilled nursing facility    Barriers to Discharge        Equipment Recommendations  Defer to next venue    Recommendations for Other Services    Frequency Min 3X/week   Plan Discharge plan remains appropriate;Frequency remains appropriate    Precautions / Restrictions Precautions Precautions: Fall Precaution Comments: + RUE DVT   Pertinent Vitals/Pain RUE is painful    Mobility  Bed Mobility Sit to Supine: 4: Min assist Details for Bed Mobility Assistance: assit to get Legs onto bed. Transfers Transfers: Sit to Stand;Stand to [employer] Transfers Sit to Stand: 3: Mod assist;From chair/3-in-1 Stand to Sit: To bed;4: Min assist Stand Pivot Transfers: 3: Mod assist Details for Transfer Assistance: VC for support due to RUE dysfunction, VC to turn and backward streps Ambulation/Gait Ambulation/Gait Assistance Details: NT. assisted back to bed to have doppler of RUE    Exercises     PT Diagnosis:    PT Problem List:   PT Treatment Interventions:     PT Goals Acute Rehab PT Goals Pt will go Sit to Stand: with supervision PT Goal: Sit to Stand - Progress: Progressing toward goal Pt will go Stand to Sit: with supervision PT Goal: Stand to Sit - Progress: Progressing toward goal  Visit Information  Last PT Received On: 10/06/11 Assistance Needed: +2    Subjective Data  Subjective: I can't use this L hand at all and my r fingers are numb.   Cognition  Overall Cognitive Status: Appears within functional limits for tasks  assessed/performed Arousal/Alertness: Awake/alert Orientation Level: Appears intact for tasks assessed Behavior During Session: [facility] for tasks performed    Balance     End of Session PT - End of Session Activity Tolerance: Patient limited by fatigue Patient left: in bed;with call bell/phone within reach Nurse Communication: Mobility status   GP     [name] 10/06/2011, 3:45 PM

## 2011-10-06 NOTE — Progress Notes (Signed)
OT Note:  Awaiting doppler to r/o RUE DVT.  Will reattempt tomorrow.  [place], [place] [phone] 10/06/2011

## 2011-10-06 NOTE — Progress Notes (Signed)
Clinical Social Work Department BRIEF PSYCHOSOCIAL ASSESSMENT 10/06/2011  Patient:  [name], [name]     Account Number:  [id number]     Admit date:  10/03/2011  Clinical Social Worker:  Tommi Emery, CLINICAL SOCIAL WORKER  Date/Time:  10/06/2011 10:39 AM  Referred by:  Physician  Date Referred:  10/05/2011 Referred for  SNF Placement   Other Referral:   Interview type:  Other - See comment Other interview type:   spoke witht he patient and his wife in the room.    PSYCHOSOCIAL DATA Living Status:  FAMILY Admitted from facility:   Level of care:   Primary support name:  Vernona Rieger Primary support relationship to patient:  SPOUSE Degree of support available:   okay. the spouses have excellent communication and are able to be there for each other emotionally. However, the wife is also dependant in her level of care too. On the other hadn, they do have friends they are able to call upon to help them.    CURRENT CONCERNS Current Concerns  Adjustment to Illness  Other - See comment   Other Concerns:   the wife is unable to walk without assistance and is unable to care for her husband related to amublation. She is able to help cook and client, however both are limited in thier abilities due to the degree of thier own illness's.    SOCIAL WORK ASSESSMENT / PLAN the patient is referred for services for skillled placement due to freguent falls. His wife can only provide limited assistanc.  the patience needs help with increasing his strength and tollerance.   Assessment/plan status:  Psychosocial Support/Ongoing Assessment of Needs Other assessment/ plan:   Information/referral to community resources:    PATIENT'S/FAMILY'S RESPONSE TO PLAN OF CARE: the patient and wife are okay with seeking out skilled placement. there is some concern around the wife being left at home. the patient would perfer his wife to be with him.        Kayleen Memos. Leighton Ruff [phone]

## 2011-10-06 NOTE — Progress Notes (Signed)
ANTICOAGULATION CONSULT NOTE - Initial Consult  Pharmacy Consult for Lovenox/Coumadin Indication: RUE DVT  No Known Allergies  Patient Measurements: Height: 5\' 3"  (160 cm) Weight: 208 lb 8.9 oz (94.6 kg) IBW/kg (Calculated) : 56.9    Vital Signs: Temp: 98.1 F (36.7 C) (07/04 0654) Temp src: Oral (07/04 0654) BP: 155/79 mmHg (07/04 0654) Pulse Rate: 101  (07/04 0654)  Labs:  Basename 10/06/11 0324 10/05/11 0327 10/04/11 1035 10/03/11 1625 10/03/11 1600  HGB 8.5* 7.6* -- -- --  HCT 28.1* 24.7* 26.4* -- --  PLT 265 245 243 -- --  APTT -- -- -- -- 37  LABPROT -- -- -- -- 16.8*  INR -- -- -- -- 1.34  HEPARINUNFRC -- -- -- -- --  CREATININE -- 1.10 1.12 1.30 --  CKTOTAL -- -- -- -- 94  CKMB -- -- -- -- 5.2*  TROPONINI -- -- -- -- <0.30    Estimated Creatinine Clearance: 63.6 ml/min (by C-G formula based on Cr of 1.1).   Medical History: Past Medical History  Diagnosis Date  . Diabetes mellitus   . Hypertension   . Hyperlipidemia     Medications:  Scheduled:    . acarbose  25 mg Oral TID WC  . captopril  25 mg Oral TID  . cholecalciferol  2,000 Units Oral Daily  . fluticasone  2 spray Each Nare Daily  . glimepiride  1 mg Oral BID  . insulin aspart  0-5 Units Subcutaneous QHS  . insulin aspart  0-9 Units Subcutaneous TID WC  . iron dextran (INFED/DEXFERRUM) IVPB (TEST DOSE)  25 mg Intravenous Once  . iron dextran (INFED/DEXFERRRUM) 500 MG IVPB  500 mg Intravenous Daily  . metFORMIN  1,000 mg Oral BID WC  . predniSONE  40 mg Oral QAC breakfast  . senna  1 tablet Oral BID  . simvastatin  20 mg Oral QPM   Infusions:   PRN: HYDROcodone-acetaminophen, morphine injection, ondansetron (ZOFRAN) IV, ondansetron, polyethylene glycol, sodium chloride  Assessment:  70 YOM with lumbar spinal stenosis, admitted 7/1 with frequent falls at home  Doppler positive for right upper extremity DVT  Baseline INR = 1.34 on 7/1  Anemia, Hg improved s/p PRBC 7/1, iron  dextran  Day #1/5 minimum overlap for treatment of new VTE   Goal of Therapy:  INR 2-3 Monitor platelets by anticoagulation protocol: Yes   Plan:   Lovenox 95mg  sq q12h  Coumadin 7.5mg  po today  Daily PT/INR  Coumadin education book/video ordered  Loralee Pacas, PharmD, BCPS Pager: [phone] 10/06/2011,2:24 PM

## 2011-10-07 LAB — GLUCOSE, CAPILLARY: Glucose-Capillary: 77 mg/dL (ref 70–99)

## 2011-10-07 MED ORDER — SENNA 8.6 MG PO TABS: 1.0000 | ORAL_TABLET | Freq: Two times a day (BID) | ORAL | Status: DC

## 2011-10-07 MED ORDER — WARFARIN SODIUM 7.5 MG PO TABS
7.5000 mg | ORAL_TABLET | Freq: Once | ORAL | Status: DC
  Filled 2011-10-07: qty 1

## 2011-10-07 MED ORDER — FLUTICASONE PROPIONATE 50 MCG/ACT NA SUSP: 2.0000 | Freq: Every day | NASAL | Status: DC

## 2011-10-07 MED ORDER — MAGNESIUM HYDROXIDE 400 MG/5ML PO SUSP: 30.0000 mL | Freq: Every day | ORAL | Status: AC | PRN

## 2011-10-07 MED ORDER — ENOXAPARIN SODIUM 100 MG/ML ~~LOC~~ SOLN: 95.0000 mg | Freq: Two times a day (BID) | SUBCUTANEOUS | Status: DC

## 2011-10-07 MED ORDER — BENZOYL PEROXIDE 5 % EX LIQD: Freq: Two times a day (BID) | CUTANEOUS | Status: AC

## 2011-10-07 MED ORDER — WARFARIN SODIUM 7.5 MG PO TABS: 7.5000 mg | ORAL_TABLET | Freq: Once | ORAL | Status: DC

## 2011-10-07 MED ORDER — PREDNISONE 20 MG PO TABS: 40.0000 mg | ORAL_TABLET | Freq: Every day | ORAL | Status: AC

## 2011-10-07 MED ORDER — HYDROCODONE-ACETAMINOPHEN 5-325 MG PO TABS: 1.0000 | ORAL_TABLET | ORAL | Status: AC | PRN

## 2011-10-07 NOTE — Progress Notes (Addendum)
CSW received bed offers. CSW presented the offers to the patient. The patient choice [facility]. The facility was contact. CSW is waiting on the facility response.   Kayleen Memos. Leighton Ruff [phone]

## 2011-10-07 NOTE — Discharge Summary (Signed)
Physician Discharge Summary  Patient ID: [name] MRN: [id number] DOB/AGE: Aug 19, 1940 71 y.o.  Admit date: 10/03/2011 Discharge date: 10/07/2011  Primary Care Physician:  [name], MD   Discharge Diagnoses:    Principal Problem:  *Falls frequently  Active Problems:   Normocytic anemia   Diabetes mellitus, type 2   HTN (hypertension)   Gait abnormality secondary to lumbar spinal stenosis   Venous insufficiency   Complex regional pain syndrome of right upper extremity secondary to severe cervical spinal stenosis   Hyperlipidemia   DVT of right proximal brachial through the distal axillary vein, acute   Constipation due to pain medication    Recommendations for hospital follow-up: 1.  Daily PT/INR checks until coumadin stable. 2.  Continue Lovenox until INR therapeutic x 48 hours. 3.  Follow up with Dr. Lajoyce Corners and Dr. Terrace Arabia in 2 weeks.  Please call these offices for appointment times.    Discharge Medications:  Medication List  As of 10/07/2011  1:49 PM   STOP taking these medications         HYDROcodone-acetaminophen 5-500 MG per tablet         TAKE these medications         acarbose 50 MG tablet   Commonly known as: PRECOSE   Take 25 mg by mouth 3 (three) times daily with meals.      aspirin 81 MG tablet   Take 81 mg by mouth daily.      benzoyl peroxide 5 % external liquid   Apply topically 2 (two) times daily.      captopril 25 MG tablet   Commonly known as: CAPOTEN   Take 25 mg by mouth 3 (three) times daily.      cholecalciferol 1000 UNITS tablet   Commonly known as: VITAMIN D   Take 2,000 Units by mouth daily.      enoxaparin 100 MG/ML injection   Commonly known as: LOVENOX   Inject 0.95 mLs (95 mg total) into the skin every 12 (twelve) hours. Take until INR therapeutic x 48 hours.      fluticasone 50 MCG/ACT nasal spray   Commonly known as: FLONASE   Place 2 sprays into the nose daily.      glimepiride 1 MG tablet   Commonly  known as: AMARYL   Take 1 mg by mouth 2 (two) times daily.      HYDROcodone-acetaminophen 5-325 MG per tablet   Commonly known as: NORCO   Take 1 tablet by mouth every 4 (four) hours as needed.      magnesium hydroxide 400 MG/5ML suspension   Commonly known as: MILK OF MAGNESIA   Take 30 mLs by mouth daily as needed.      metFORMIN 500 MG tablet   Commonly known as: GLUCOPHAGE   Take 1,000 mg by mouth 2 (two) times daily with a meal.      predniSONE 20 MG tablet   Commonly known as: DELTASONE   Take 2 tablets (40 mg total) by mouth daily before breakfast. Take x 5 days      senna 8.6 MG Tabs   Commonly known as: SENOKOT   Take 1 tablet (8.6 mg total) by mouth 2 (two) times daily.      simvastatin 20 MG tablet   Commonly known as: ZOCOR   Take 20 mg by mouth every evening.      warfarin 7.5 MG tablet   Commonly known as: COUMADIN   Take 1 tablet (7.5  mg total) by mouth one time only at 6 PM. Take as directed by PCP with daily INR checks until stable.             Disposition and Follow-up: The patient is being discharged to a SNF.  He should follow up with his orthopedic doctor and with his neurologist in 2 weeks.  His PT/INR should be monitored daily by the SNF MD or faxed to his PCP for coumadin adjustment.  Medical Consultants:  None. Telephone consultation made with Dr. Lajoyce Corners. Other consultants:  Physical therapy:  Procedures and Diagnostic Studies:   Dg Chest 2 View 10/03/2011 IMPRESSION: Stable mild cardiomegaly.  No active lung disease.  Original Report Authenticated By: Juline Patch, M.D.    Ct Head Wo Contrast 10/03/2011 IMPRESSION: Atrophy.  No acute intracranial abnormality.  Original Report Authenticated By: Juline Patch, M.D.    Mr Cervical Spine Wo Contrast 10/05/2011   IMPRESSION: Marked worsening of cervical spine degenerative disease since the previous study of September 2010.  There is severe spinal stenosis worse in descending order at C4-5, C5-6, C3-4,  C6-7 and C7-T1.  At the C4-5 level, AP diameter of the canal is only 4 mm and the cord is markedly flattened.  There is early edematous change in the cord at this level. See above for details at each level.  Original Report Authenticated By: Thomasenia Sales, M.D.    Discharge Laboratory Values: Basic Metabolic Panel:  Lab 10/05/11 8119 10/04/11 1035 10/03/11 1625 10/03/11 1600  NA 137 138 145 142  K 4.2 4.0 -- --  CL 107 106 111 108  CO2 22 23 -- 21  GLUCOSE 93 186* 95 93  BUN 23 28* 32* 31*  CREATININE 1.10 1.12 1.30 1.17  CALCIUM 9.4 9.6 -- 10.0  MG -- -- -- --  PHOS -- -- -- --   GFR Estimated Creatinine Clearance: 64.7 ml/min (by C-G formula based on Cr of 1.1). Liver Function Tests:  Lab 10/03/11 1600  AST 24  ALT 19  ALKPHOS 94  BILITOT 0.3  PROT 7.1  ALBUMIN 3.7   Coagulation profile  Lab 10/07/11 0852 10/03/11 1600  INR 1.29 1.34  PROTIME -- --    CBC:  Lab 10/06/11 0324 10/05/11 0327 10/04/11 1035 10/04/11 0300 10/03/11 1840 10/03/11 1600  WBC 9.5 7.0 5.9 -- -- 6.3  NEUTROABS -- -- -- -- -- 4.5  HGB 8.5* 7.6* 8.1* 7.9* 7.0* --  HCT 28.1* 24.7* 26.4* 25.1* 22.9* --  MCV 80.7 80.5 80.2 -- -- 80.5  PLT 265 245 243 -- -- 249   Cardiac Enzymes:  Lab 10/03/11 1600  CKTOTAL 94  CKMB 5.2*  CKMBINDEX --  TROPONINI <0.30   CBG:  Lab 10/07/11 1152 10/07/11 0908 10/06/11 2227 10/06/11 1713 10/06/11 1207  GLUCAP 109* 77 76 73 76   Thyroid function studies  Basename 10/04/11 1600  TSH 1.661  T4TOTAL --  T3FREE --  THYROIDAB --    Brief H and P: For complete details please refer to admission H and P, but in brief, [name] is a 71 year old man who was admitted on 10/03/2011 with frequent falls at home as well as right shoulder and arm pain. He has been under the care of Dr. Terrace Arabia, his neurologist as well as Dr. Lajoyce Corners, his orthopedic physician who had initiated an outpatient work up with EMG studies (failed to follow up for these), and a CT scan of the  head which was negative. Upon initial  evaluation emergency department, the patient was noted to be anemic with a hemoglobin of 7 mg/dL, which prompted his admission.   Physical Exam at Discharge: BP 161/84  Pulse 87  Temp 98.3 F (36.8 C) (Oral)  Resp 20  Ht 5\' 3"  (1.6 m)  Wt 97.7 kg (215 lb 6.2 oz)  BMI 38.15 kg/m2  SpO2 97% Gen: NAD  Cardiovascular: RRR, No M/R/G  Respiratory: Lungs CTAB  Gastrointestinal: Abdomen soft, NT/ND with normal active bowel sounds.  Extremities: 3+ RUE swelling, BLE 2+ swelling, R>L.  Hospital Course:  Principal problem:  *Falls frequently / gait abnormality secondary to lumbar spinal stenosis  CT of the head on 10/03/2011 reviewed. Atrophy but no acute intracranial process noted.  Status post physical and occupational therapy evaluations. Skilled nursing home placement recommended.  MRI results from 09/01/2010 reviewed and consistent with spinal stenosis, which is likely the underlying etiology of his gait abnormality and frequent falls.  Short course steroids ordered, as discussed with Dr. Lajoyce Corners, who will see him in follow up in 2 weeks. Active Problems:  Constipation due to pain medication  MOM PRN. Continue stool softeners. Right upper extremity DVT (proximal brachial through distal axillary vein)  Doppler studies done 10/05/11 confirmed DVT.  Continue Lovenox/coumadin with daily PT/INR checks until INR therapeutic x 48 hours, then coumadin alone. Normocytic anemia  No evidence of GI blood loss, fecal occult blood testing was negative.  Noted to be chronic on records review.  Received one unit of packed red blood cells 10/03/2011.  Anemia panel consistent with iron deficiency.  Given iron replacement IV on 10/05/11.  Hemoglobin stable. Diabetes mellitus type 2 with complications, controlled  Hemoglobin A1c 6.1, indicating good control.  Continue current therapy including acarbose, metformin, Amaryl.  HTN (hypertension)  Reasonable control though  slightly suboptimal, on captopril.  Monitor and adjust blood pressure medications if indicated. Venous insufficiency  Continue TED hose. 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 Multilevel disc disease noted.  Apparently is under the care of Dr. Terrace Arabia of neurology and Dr. Lajoyce Corners of orthopedics for this.  Had outpatient MRI brain done by Dr. Terrace Arabia (office faxed results to hospital: Reviewed, negative). EMG studies ordered but never completed by patient, per office notes.  Spoke with Dr. Lajoyce Corners and he recommended ordering a MRI of the cervical spine and a Doppler of the right upper extremity for further evaluation.  MRI of C spine completed 10/05/11 which showed marked progression of cervical disc disease / spinal stenosis.  Trial short course steroids, 40 mg daily x 5 days ordered.  Continue pain medications as needed. F/U with Dr. Lajoyce Corners in 2 weeks. Hyperlipidemia  Continue statin therapy.  Discharge Instructions:  Diet:  Carbohydrate modified  Activity:  Increase activity slowly, walk with assistance, with a walker  Condition at Discharge:   Improved  Time spent on Discharge:  35 minutes  Signed: Dr. Trula Ore Levia [place]ire Pager [phone] 10/07/2011, 1:49 PM

## 2011-10-07 NOTE — Progress Notes (Signed)
Occupational Therapy Treatment Patient Details Name: [name] MRN: [id number] DOB: [birth year] Today's Date: 10/07/2011 Time: 7829-5621 OT Time Calculation (min): 12 min  OT Assessment / Plan / Recommendation Comments on Treatment Session Pt seeen only for self feeding today.New RUE DVT; on lovenox.  Pt is not carrying over with positioning.  LUE impaired due to sensation--having difficulty manipulating things.  Have not used washmitt yet.  Will attempt this on next visit      Follow Up Recommendations  Skilled nursing facility    Barriers to Discharge       Equipment Recommendations       Recommendations for Other Services    Frequency Min 2X/week   Plan      Precautions / Restrictions Precautions Precautions: Fall Precaution Comments: + RUE DVT Restrictions Weight Bearing Restrictions: No   Pertinent Vitals/Pain No pain    ADL  Eating/Feeding: Performed;Minimal assistance Where Assessed - Eating/Feeding: Bed level ADL Comments: built up foam was missing from pt's room.  Provided more and pt able to self feed with LUE with full set up and occasional min A to reposition drink.  Must use cup with handle.  Spoke to NT to make sure foam is removed before tray is picked up.    OT Diagnosis:    OT Problem List:   OT Treatment Interventions:     OT Goals Acute Rehab OT Goals Time For Goal Achievement: 10/18/11 ADL Goals Pt Will Perform Eating: with set-up;Sitting, chair;Supported;with adaptive utensils ADL Goal: Eating - Progress: Progressing toward goals Arm Goals Additional Arm Goal #1: Pt will direct others to position RUE to control edema Arm Goal: Additional Goal #1 - Progress: Progressing toward goals  Visit Information  Last OT Received On: 10/07/11 Assistance Needed: +1    Subjective Data      Prior Functioning       Cognition  Overall Cognitive Status: Appears within functional limits for tasks assessed/performed Behavior During Session: [facility] for  tasks performed    Mobility     Exercises    Balance    End of Session OT - End of Session Activity Tolerance: Patient tolerated treatment well Patient left: in bed;with call bell/phone within reach  GO Functional Assessment Tool Used: clinical judgment Functional Limitation: Self care Self Care Current Status (H0865): At least 40 percent but less than 60 percent impaired, limited or restricted Self Care Goal Status (H8469): At least 20 percent but less than 40 percent impaired, limited or restricted   [facility] 10/07/2011, 11:21 AM Marica Otter, OTR/L [phone] 10/07/2011

## 2011-10-07 NOTE — Progress Notes (Addendum)
ANTICOAGULATION CONSULT NOTE - Follow-Up  Pharmacy Consult for Lovenox/Coumadin Indication: RUE DVT  No Known Allergies  Patient Measurements: Height: 5\' 3"  (160 cm) Weight: 215 lb 6.2 oz (97.7 kg) IBW/kg (Calculated) : 56.9    Vital Signs: Temp: 98.3 F (36.8 C) (07/05 0731) Temp src: Oral (07/05 0731) BP: 161/84 mmHg (07/05 0731) Pulse Rate: 87  (07/05 0731)  Labs:  Basename 10/06/11 0324 10/05/11 0327 10/04/11 1035  HGB 8.5* 7.6* --  HCT 28.1* 24.7* 26.4*  PLT 265 245 243  APTT -- -- --  LABPROT -- -- --  INR -- -- --  HEPARINUNFRC -- -- --  CREATININE -- 1.10 1.12  CKTOTAL -- -- --  CKMB -- -- --  TROPONINI -- -- --    Estimated Creatinine Clearance: 64.7 ml/min (by C-G formula based on Cr of 1.1).   Medical History: Past Medical History  Diagnosis Date  . Diabetes mellitus   . Hypertension   . Hyperlipidemia     Medications:  Scheduled:     . acarbose  25 mg Oral TID WC  . captopril  25 mg Oral TID  . cholecalciferol  2,000 Units Oral Daily  . enoxaparin (LOVENOX) injection  95 mg Subcutaneous Q12H  . fluticasone  2 spray Each Nare Daily  . glimepiride  1 mg Oral BID  . insulin aspart  0-5 Units Subcutaneous QHS  . insulin aspart  0-9 Units Subcutaneous TID WC  . iron dextran (INFED/DEXFERRRUM) 500 MG IVPB  500 mg Intravenous Daily  . metFORMIN  1,000 mg Oral BID WC  . patient's guide to using coumadin book   Does not apply Once  . predniSONE  40 mg Oral QAC breakfast  . senna  1 tablet Oral BID  . simvastatin  20 mg Oral QPM  . warfarin  7.5 mg Oral ONCE-1800  . warfarin   Does not apply Once  . Warfarin - Pharmacist Dosing Inpatient   Does not apply q1800   Infusions:   PRN: HYDROcodone-acetaminophen, magnesium hydroxide, morphine injection, ondansetron (ZOFRAN) IV, ondansetron, polyethylene glycol, sodium chloride  Assessment:  70 YOM with lumbar spinal stenosis, admitted 7/1 with frequent falls at home  Doppler positive for right  upper extremity DVT  Baseline INR = 1.34 on 7/1  Anemia, Hg improved s/p PRBC 7/1, iron dextran  Day #2/5 minimum overlap for treatment of new VTE   Goal of Therapy:  INR 2-3 Monitor platelets by anticoagulation protocol: Yes   Plan:   Lovenox 95mg  sq q12h  Coumadin 7.5mg  po today  Daily PT/INR  Gwen Her PharmD  [phone] 10/07/2011 8:05 AM    Coumadin education completed  Gwen Her PharmD  [phone] 10/07/2011 12:23 PM

## 2011-10-07 NOTE — Progress Notes (Signed)
Report called in to SNF. Patient discharged to SNF.

## 2011-10-10 NOTE — Progress Notes (Signed)
Clinical Social Work Department CLINICAL SOCIAL WORK PLACEMENT NOTE 10/10/2011  Patient:  PARLEY, PIDCOCK  Account Number:  [id number] Admit date:  10/03/2011  Clinical Social Worker:  Tommi Emery, CLINICAL SOCIAL WORKER  Date/time:  10/10/2011 09:15 AM  Clinical Social Work is seeking post-discharge placement for this patient at the following level of care:   SKILLED NURSING   (*CSW will update this form in Epic as items are completed)   10/06/2011  Patient/family provided with [facility] Department of Clinical Social Work's list of facilities offering this level of care within the geographic area requested by the patient (or if unable, by the patient's family).  10/06/2011  Patient/family informed of their freedom to choose among providers that offer the needed level of care, that participate in Medicare, Medicaid or managed care program needed by the patient, have an available bed and are willing to accept the patient.  10/06/2011  Patient/family informed of [facility]' ownership interest in [facility], as well as of the fact that they are under no obligation to receive care at this facility.  PASARR submitted to EDS on 10/06/2011 PASARR number received from EDS on 10/06/2011  FL2 transmitted to all facilities in geographic area requested by pt/family on  10/06/2011 FL2 transmitted to all facilities within larger geographic area on 10/06/2011  Patient informed that his/her managed care company has contracts with or will negotiate with  certain facilities, including the following:     Patient/family informed of bed offers received:  10/06/2011 Patient chooses bed at [facility] Physician recommends and patient chooses bed at  [facility]  Patient to be transferred to [facility] on  10/07/2011 Patient to be transferred to facility by [facility]  The following physician request were entered in Epic:   Additional Comments: the patient and family were fimilar  with the facility of placement. they were surprised that they recieved an acceptance. The patient said that he applied before but was not given a bed offer the last time.  Kayleen Memos. Leighton Ruff [phone]

## 2011-10-10 NOTE — Progress Notes (Signed)
Pt for D/C today to [facility]. Plan transport via EMS. Pt and family are agreeable to plans.  Kayleen Memos. Leighton Ruff [phone]

## 2011-10-27 ENCOUNTER — Inpatient Hospital Stay (HOSPITAL_COMMUNITY)
Admission: EM | Admit: 2011-10-27 | Discharge: 2011-11-08 | DRG: 471 | Disposition: A | Discharge status: Skilled Nursing Facility | Payer: Medicare Other | Attending: Neurosurgery | Admitting: Neurosurgery

## 2011-10-27 ENCOUNTER — Emergency Department (HOSPITAL_COMMUNITY): Payer: Medicare Other

## 2011-10-27 ENCOUNTER — Encounter (HOSPITAL_COMMUNITY): Payer: Self-pay | Admitting: Emergency Medicine

## 2011-10-27 DIAGNOSIS — G8250 Quadriplegia, unspecified: Secondary | ICD-10-CM | POA: Diagnosis present

## 2011-10-27 DIAGNOSIS — G959 Disease of spinal cord, unspecified: Secondary | ICD-10-CM | POA: Diagnosis present

## 2011-10-27 DIAGNOSIS — R531 Weakness: Secondary | ICD-10-CM

## 2011-10-27 DIAGNOSIS — E785 Hyperlipidemia, unspecified: Secondary | ICD-10-CM | POA: Diagnosis present

## 2011-10-27 DIAGNOSIS — M47812 Spondylosis without myelopathy or radiculopathy, cervical region: Secondary | ICD-10-CM | POA: Diagnosis present

## 2011-10-27 DIAGNOSIS — R5383 Other fatigue: Secondary | ICD-10-CM

## 2011-10-27 DIAGNOSIS — Z7982 Long term (current) use of aspirin: Secondary | ICD-10-CM

## 2011-10-27 DIAGNOSIS — K5903 Drug induced constipation: Secondary | ICD-10-CM | POA: Diagnosis present

## 2011-10-27 DIAGNOSIS — K5909 Other constipation: Secondary | ICD-10-CM | POA: Diagnosis present

## 2011-10-27 DIAGNOSIS — T40605A Adverse effect of unspecified narcotics, initial encounter: Secondary | ICD-10-CM | POA: Diagnosis present

## 2011-10-27 DIAGNOSIS — Z79899 Other long term (current) drug therapy: Secondary | ICD-10-CM

## 2011-10-27 DIAGNOSIS — Z9181 History of falling: Secondary | ICD-10-CM

## 2011-10-27 DIAGNOSIS — I10 Essential (primary) hypertension: Secondary | ICD-10-CM

## 2011-10-27 DIAGNOSIS — M48061 Spinal stenosis, lumbar region without neurogenic claudication: Secondary | ICD-10-CM | POA: Diagnosis present

## 2011-10-27 DIAGNOSIS — G90519 Complex regional pain syndrome I of unspecified upper limb: Secondary | ICD-10-CM

## 2011-10-27 DIAGNOSIS — M4712 Other spondylosis with myelopathy, cervical region: Secondary | ICD-10-CM | POA: Diagnosis present

## 2011-10-27 DIAGNOSIS — E1122 Type 2 diabetes mellitus with diabetic chronic kidney disease: Secondary | ICD-10-CM | POA: Diagnosis present

## 2011-10-27 DIAGNOSIS — R5381 Other malaise: Secondary | ICD-10-CM

## 2011-10-27 DIAGNOSIS — Z86718 Personal history of other venous thrombosis and embolism: Secondary | ICD-10-CM

## 2011-10-27 DIAGNOSIS — M4802 Spinal stenosis, cervical region: Secondary | ICD-10-CM | POA: Diagnosis present

## 2011-10-27 DIAGNOSIS — M5000 Cervical disc disorder with myelopathy, unspecified cervical region: Principal | ICD-10-CM | POA: Diagnosis present

## 2011-10-27 DIAGNOSIS — S98139A Complete traumatic amputation of one unspecified lesser toe, initial encounter: Secondary | ICD-10-CM

## 2011-10-27 DIAGNOSIS — I82A11 Acute embolism and thrombosis of right axillary vein: Secondary | ICD-10-CM

## 2011-10-27 DIAGNOSIS — G90511 Complex regional pain syndrome I of right upper limb: Secondary | ICD-10-CM | POA: Diagnosis present

## 2011-10-27 DIAGNOSIS — D649 Anemia, unspecified: Secondary | ICD-10-CM | POA: Diagnosis present

## 2011-10-27 DIAGNOSIS — E119 Type 2 diabetes mellitus without complications: Secondary | ICD-10-CM | POA: Diagnosis present

## 2011-10-27 DIAGNOSIS — Z96649 Presence of unspecified artificial hip joint: Secondary | ICD-10-CM

## 2011-10-27 HISTORY — DX: Complex regional pain syndrome i of right upper limb: G90.511

## 2011-10-27 HISTORY — DX: Acute embolism and thrombosis of unspecified deep veins of unspecified lower extremity: I82.409

## 2011-10-27 HISTORY — DX: Spinal stenosis, cervical region: M48.02

## 2011-10-27 HISTORY — DX: Spinal stenosis, lumbar region without neurogenic claudication: M48.061

## 2011-10-27 HISTORY — DX: Anemia, unspecified: D64.9

## 2011-10-27 LAB — CBC WITH DIFFERENTIAL/PLATELET
Basophils Absolute: 0.1 10*3/uL (ref 0.0–0.1)
Eosinophils Absolute: 0.1 10*3/uL (ref 0.0–0.7)
HCT: 32.6 % — ABNORMAL LOW (ref 39.0–52.0)
Lymphs Abs: 1.4 10*3/uL (ref 0.7–4.0)
MCH: 26.8 pg (ref 26.0–34.0)
MCHC: 31.9 g/dL (ref 30.0–36.0)
MCV: 84 fL (ref 78.0–100.0)
Monocytes Absolute: 0.5 10*3/uL (ref 0.1–1.0)
Monocytes Relative: 8 % (ref 3–12)
Neutro Abs: 4.2 10*3/uL (ref 1.7–7.7)
Platelets: 238 10*3/uL (ref 150–400)
RDW: 22.6 % — ABNORMAL HIGH (ref 11.5–15.5)

## 2011-10-27 LAB — POCT I-STAT TROPONIN I

## 2011-10-27 LAB — BASIC METABOLIC PANEL
BUN: 22 mg/dL (ref 6–23)
Calcium: 9.7 mg/dL (ref 8.4–10.5)
Chloride: 101 mEq/L (ref 96–112)
Creatinine, Ser: 0.87 mg/dL (ref 0.50–1.35)
GFR calc Af Amer: >90 mL/min (ref >90)

## 2011-10-27 LAB — MRSA PCR SCREENING: MRSA by PCR: NEGATIVE

## 2011-10-27 LAB — PROTIME-INR: Prothrombin Time: 22 seconds — ABNORMAL HIGH (ref 11.6–15.2)

## 2011-10-27 MED ORDER — MORPHINE SULFATE 4 MG/ML IJ SOLN
4.0000 mg | Freq: Once | INTRAMUSCULAR | Status: AC
  Administered 2011-10-27: 4 mg via INTRAVENOUS
  Filled 2011-10-27: qty 1

## 2011-10-27 MED ORDER — ONDANSETRON HCL 4 MG/2ML IJ SOLN
4.0000 mg | Freq: Once | INTRAMUSCULAR | Status: AC
  Administered 2011-10-27: 4 mg via INTRAVENOUS
  Filled 2011-10-27: qty 2

## 2011-10-27 MED ORDER — INSULIN ASPART 100 UNIT/ML ~~LOC~~ SOLN
0.0000 [IU] | Freq: Three times a day (TID) | SUBCUTANEOUS | Status: DC
  Administered 2011-10-28 – 2011-10-29 (×2): 2 [IU] via SUBCUTANEOUS
  Administered 2011-10-30: 3 [IU] via SUBCUTANEOUS
  Administered 2011-10-30: 1 [IU] via SUBCUTANEOUS
  Administered 2011-11-01: 5 [IU] via SUBCUTANEOUS
  Administered 2011-11-01 (×2): 3 [IU] via SUBCUTANEOUS
  Administered 2011-11-02 (×2): 2 [IU] via SUBCUTANEOUS
  Administered 2011-11-02: 1 [IU] via SUBCUTANEOUS
  Administered 2011-11-03: 2 [IU] via SUBCUTANEOUS
  Administered 2011-11-03: 1 [IU] via SUBCUTANEOUS
  Administered 2011-11-04 – 2011-11-05 (×3): 2 [IU] via SUBCUTANEOUS
  Administered 2011-11-05: 1 [IU] via SUBCUTANEOUS
  Administered 2011-11-05 – 2011-11-06 (×2): 2 [IU] via SUBCUTANEOUS
  Administered 2011-11-06: 3 [IU] via SUBCUTANEOUS
  Administered 2011-11-06: 5 [IU] via SUBCUTANEOUS
  Administered 2011-11-07 – 2011-11-08 (×5): 2 [IU] via SUBCUTANEOUS

## 2011-10-27 MED ORDER — MORPHINE SULFATE 2 MG/ML IJ SOLN
2.0000 mg | INTRAMUSCULAR | Status: DC | PRN
  Administered 2011-10-28 (×2): 2 mg via INTRAVENOUS
  Filled 2011-10-27 (×2): qty 1

## 2011-10-27 MED ORDER — FLUTICASONE PROPIONATE 50 MCG/ACT NA SUSP
2.0000 | Freq: Every day | NASAL | Status: DC
  Administered 2011-10-28 – 2011-11-06 (×6): 2 via NASAL
  Filled 2011-10-27: qty 16

## 2011-10-27 MED ORDER — SODIUM CHLORIDE 0.9 % IJ SOLN
3.0000 mL | Freq: Two times a day (BID) | INTRAMUSCULAR | Status: DC
  Administered 2011-10-28 – 2011-10-30 (×5): 3 mL via INTRAVENOUS

## 2011-10-27 MED ORDER — ONDANSETRON HCL 4 MG/2ML IJ SOLN
INTRAMUSCULAR | Status: AC
  Filled 2011-10-27: qty 2

## 2011-10-27 MED ORDER — ZOLPIDEM TARTRATE 5 MG PO TABS
5.0000 mg | ORAL_TABLET | Freq: Every evening | ORAL | Status: DC | PRN
  Administered 2011-10-28 – 2011-11-07 (×6): 5 mg via ORAL
  Filled 2011-10-27 (×7): qty 1

## 2011-10-27 MED ORDER — ONDANSETRON HCL 4 MG/2ML IJ SOLN
4.0000 mg | Freq: Once | INTRAMUSCULAR | Status: AC
  Administered 2011-10-27: 4 mg via INTRAVENOUS

## 2011-10-27 MED ORDER — ONDANSETRON HCL 4 MG PO TABS: 4.0000 mg | ORAL_TABLET | Freq: Four times a day (QID) | ORAL | Status: DC | PRN

## 2011-10-27 MED ORDER — ONDANSETRON HCL 4 MG/2ML IJ SOLN: 4.0000 mg | Freq: Four times a day (QID) | INTRAMUSCULAR | Status: DC | PRN

## 2011-10-27 MED ORDER — SODIUM CHLORIDE 0.9 % IJ SOLN: 3.0000 mL | INTRAMUSCULAR | Status: DC | PRN

## 2011-10-27 MED ORDER — ACETAMINOPHEN 650 MG RE SUPP: 650.0000 mg | Freq: Four times a day (QID) | RECTAL | Status: DC | PRN

## 2011-10-27 MED ORDER — ACETAMINOPHEN 325 MG PO TABS
650.0000 mg | ORAL_TABLET | Freq: Four times a day (QID) | ORAL | Status: DC | PRN
  Administered 2011-10-28 – 2011-10-30 (×2): 650 mg via ORAL
  Filled 2011-10-27 (×2): qty 2

## 2011-10-27 MED ORDER — CAPTOPRIL 25 MG PO TABS
25.0000 mg | ORAL_TABLET | Freq: Three times a day (TID) | ORAL | Status: DC
  Administered 2011-10-28 – 2011-11-08 (×31): 25 mg via ORAL
  Filled 2011-10-27 (×38): qty 1

## 2011-10-27 MED ORDER — ENOXAPARIN SODIUM 100 MG/ML ~~LOC~~ SOLN
100.0000 mg | Freq: Two times a day (BID) | SUBCUTANEOUS | Status: DC
  Administered 2011-10-28 – 2011-10-29 (×4): 100 mg via SUBCUTANEOUS
  Filled 2011-10-27 (×6): qty 1

## 2011-10-27 MED ORDER — SODIUM CHLORIDE 0.9 % IV SOLN: 250.0000 mL | INTRAVENOUS | Status: DC | PRN

## 2011-10-27 MED ORDER — DEXAMETHASONE SODIUM PHOSPHATE 4 MG/ML IJ SOLN
4.0000 mg | Freq: Once | INTRAMUSCULAR | Status: AC
  Administered 2011-10-28: 4 mg via INTRAVENOUS
  Filled 2011-10-27: qty 1

## 2011-10-27 MED ORDER — SIMVASTATIN 20 MG PO TABS
20.0000 mg | ORAL_TABLET | Freq: Every day | ORAL | Status: DC
  Administered 2011-10-28 – 2011-11-07 (×10): 20 mg via ORAL
  Filled 2011-10-27 (×13): qty 1

## 2011-10-27 MED ORDER — INSULIN ASPART 100 UNIT/ML ~~LOC~~ SOLN: 0.0000 [IU] | Freq: Three times a day (TID) | SUBCUTANEOUS | Status: DC

## 2011-10-27 MED ORDER — METHOCARBAMOL 500 MG PO TABS
500.0000 mg | ORAL_TABLET | Freq: Three times a day (TID) | ORAL | Status: DC | PRN
  Administered 2011-10-28 – 2011-10-30 (×4): 500 mg via ORAL
  Filled 2011-10-27 (×5): qty 1

## 2011-10-27 NOTE — ED Notes (Signed)
Pt here from [facility] with c/o back pain . Pt had hip replacement two years ago and has been [place] since that time . Per EMS pt has been doing worse the last 3 weeks with no being able to walk and move as much as before

## 2011-10-27 NOTE — ED Notes (Signed)
In to assess; wife at bedside eating sandwich meal; pt requesting food and drink; informed pt that I need to ensure his films were back and have them reviewed prior to feeding him; pt seemingly disgruntled by this; pt states is uncomfortable - requesting towels beneath his sacral area in bed; when asking pt to assist me in lifting his upper body in bed, pt states "I can not move"; when second RN came in to assist me, pt used BUEs to help roll him; pt remains frustrated appearing; states he can not go home as he is unable to care for himself - pt resides at nursing home; when asking him about this, he replies "I am not going back there, they do not take care of me there."; further states wife is unable to care for him at home as she is "disabled"; PA made aware of same

## 2011-10-27 NOTE — H&P (Signed)
History and Physical  [name] KGM:[id number] DOB: Sep 26, 1940 DOA: 10/27/2011  Referring physician: [name], PA PCP: [name], MD   Chief Complaint: back pain  HPI:  71 year old presented to the emergency department with complaint of upper back pain. Additional complaints include progressive weakness of bilateral lower extremities, urinary incontinence, increasing left upper extremity weakness. Patient was recently hospitalized earlier this month for frequent falls and gait abnormality and was referred to skilled nursing facility. Since that time he has progressively declined and has lost the ability to ambulate and eat using his left hand. He has a history of right arm weakness but is unable to clearly specify how long that has been an issue.  He reports several weeks ago he was able to ambulate and move his left hand and he can clearly no longer do these things by his report. Review of his chart is notable for cervical MRI performed 7/30 which revealed severe spinal stenosis, multilevel with marked cord flattening C4-5 and early edematous change in the cord at that level.  In the emergency department he was noted to have upper and lower extremity weakness bilaterally and admission was requested. Upon record review I requested the emergency Department provider contact neurosurgery for comment upon cervical MRI and my concern for cord compression. Neurosurgery consultation is pending at this time as are cervical spine and lumbar spine MRI.  History obtained from patient wife, vague and difficult to ascertain chronicity of complaints.  Chart Review:  Hospitalization: 7/1-10/07/2011--Frequent falls, gait abnormality secondary to lumbar spinal stenosis, complex regional pain syndrome RUE secondary to severe cervical spinal stenosis, acute DVT of RUE, anemia. Discharged to SNF.   Review of Systems:  Negative for fever, changes to his vision, sore throat, rash , chest pain,  bleeding, nausea. Positive for shortness of breath, numbness especially left arm and leg, urinary incontinence, constipation. Reports urinary incontinence is new.  Past Medical History  Diagnosis Date  . Diabetes mellitus   . Hypertension   . Hyperlipidemia   . Cervical spinal stenosis   . Lumbar spinal stenosis   . 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 DVT (deep venous thrombosis)     RUE 10/2011  . Anemia    Past Surgical History  Procedure Date  . Total hip arthroplasty   . Toe amputation    Social History:  reports that he has never smoked. He does not have any smokeless tobacco history on file. His alcohol and drug histories not on file.  No Known Allergies  Family History  Problem Relation Age of Onset  . Diabetes Brother     Prior to Admission medications   Medication Sig Start Date End Date Taking? Authorizing Provider  acarbose (PRECOSE) 25 MG tablet Take 25 mg by mouth 3 (three) times daily with meals.   Yes Historical Provider, MD  aspirin 81 MG chewable tablet Chew 81 mg by mouth daily.   Yes Historical Provider, MD  captopril (CAPOTEN) 25 MG tablet Take 25 mg by mouth 3 (three) times daily.   Yes Historical Provider, MD  cholecalciferol (VITAMIN D) 1000 UNITS tablet Take 2,000 Units by mouth daily.   Yes Historical Provider, MD  fluticasone (FLONASE) 50 MCG/ACT nasal spray Place 2 sprays into the nose daily. 10/07/11 10/06/12 Yes Christina P Rama, MD  glimepiride (AMARYL) 1 MG tablet Take 1 mg by mouth 2 (two) times daily.   Yes Historical Provider, MD  HYDROcodone-acetaminophen (NORCO/VICODIN) 5-325 MG per tablet Take 1  tablet by mouth every 4 (four) hours as needed. For pain   Yes Historical Provider, MD  magnesium hydroxide (MILK OF MAGNESIA) 400 MG/5ML suspension Take 30 mLs by mouth daily as needed.   Yes Historical Provider, MD  metFORMIN (GLUCOPHAGE) 1000 MG tablet Take 1,000 mg by mouth 2 (two) times daily with a meal.   Yes Historical  Provider, MD  methocarbamol (ROBAXIN) 500 MG tablet Take 500 mg by mouth every 8 (eight) hours as needed. For muscle spasm   Yes Historical Provider, MD  senna (SENOKOT) 8.6 MG TABS Take 1 tablet (8.6 mg total) by mouth 2 (two) times daily. 10/07/11  Yes Christina P Rama, MD  simvastatin (ZOCOR) 20 MG tablet Take 20 mg by mouth every evening.   Yes Historical Provider, MD  warfarin (COUMADIN) 6 MG tablet Take 6 mg by mouth daily.   Yes Historical Provider, MD  zolpidem (AMBIEN) 5 MG tablet Take 5 mg by mouth at bedtime as needed. For sleep   Yes Historical Provider, MD  benzoyl peroxide 5 % external liquid Apply topically 2 (two) times daily. 10/07/11 10/06/12  Maryruth Bun Rama, MD   Physical Exam: Filed Vitals:   10/27/11 1249 10/27/11 1631  BP: 128/82 137/68  Pulse: 82   Temp: 98.8 F (37.1 C) 98.2 F (36.8 C)  TempSrc: Oral Oral  Resp: 24   SpO2: 100% 100%    General:  examined in the emergency department. Appears calm and comfortable. Nontoxic.   Eyes:  pupils equal, round, react to light. Normal lids, irises, conjunctiva.   ENT: slightly hard of hearing. Lips and tongue appear unremarkable.   Neck:  no lymphadenopathy or masses. No thyromegaly.  Cardiovascular:  regular rate and rhythm. No murmur, rub, gallop. No lower extremity edema.   Respiratory:  clear to auscultation bilaterally. No wheezes, rales, rhonchi. Normal respiratory effort.  Abdomen:  soft, nontender, nondistended.   Skin:  appears grossly unremarkable without rash or induration.  Musculoskeletal: right upper extremity: Edematous, no grip strength. Unable to lift and or arm off the bed. No movement at shoulder. Left upper extremity: Weak grip strength 2/5 able to lift forearm and wrist off bed. Unable to abduct shoulder. Unable to touch mouth or head with his arm. Right lower extremity: Minimal movement of toes. Unable to lift upper or lower leg or significantly move. Left lower extremity: Able to flex knee  approximately 20. Able to wiggle toes. Unable to lift leg off bed .patellar reflexes 1+ bilaterally.   Psychiatric:  Grossly normal mood, odd affect.   Neurologic:  cranial nerves appear grossly intact. Sensation grossly intact upper and lower extremities bilaterally.   Labs on Admission:  Basic Metabolic Panel:  Lab 10/27/11 4098  NA 137  K 4.2  CL 101  CO2 24  GLUCOSE 102*  BUN 22  CREATININE 0.87  CALCIUM 9.7  MG --  PHOS --   CBC:  Lab 10/27/11 1342  WBC 6.3  NEUTROABS 4.2  HGB 10.4*  HCT 32.6*  MCV 84.0  PLT 238    Basename 10/27/11 1618  TROPIPOC 0.00   Radiological Exams on Admission: Dg Chest 1 View  10/27/2011  *RADIOLOGY REPORT*  Clinical Data: Fall 1 month ago.  Right hip pain. Back pain.  CHEST - 1 VIEW  Comparison: Two-view chest of 10/03/2011.  Findings: The heart size is normal.  The lungs are clear.  The visualized soft tissues and bony thorax are unremarkable.  Advanced degenerative changes are noted in the shoulders  bilaterally.  IMPRESSION: No acute cardiopulmonary disease.  Original Report Authenticated By: Jamesetta Orleans. MATTERN, M.D.   Dg Hip Complete Right  10/27/2011  *RADIOLOGY REPORT*  Clinical Data: Fall 1 month ago.  Progressive right-sided hip pain.  RIGHT HIP - COMPLETE 2+ VIEW  Comparison: Right hip radiograph 05/27/2009.  Findings: The patient is status post right hip arthroplasty.  The there is been no significant change in the position of the prosthesis.  There is no fracture. The hip is located.  Moderate degenerative changes are noted at the SI joints bilaterally as well as within the lumbar spine.  Mild left-sided hip degenerative changes are present.  Mild osteopenia is evident.  IMPRESSION:  1.  Status post right total hip arthroplasty without radiographic evidence for complication or change. 2.  Degenerative changes of the lower lumbar spine, SI joints, and left hip.  Original Report Authenticated By: Jamesetta Orleans. MATTERN, M.D.   Ct  Head Wo Contrast  10/27/2011  *RADIOLOGY REPORT*  Clinical Data: Severe headache.  CT HEAD WITHOUT CONTRAST  Technique:  Contiguous axial images were obtained from the base of the skull through the vertex without contrast.  Comparison: 10/03/2011  Findings: No mass lesion, mass effect, midline shift, hydrocephalus, hemorrhage.  No territorial ischemia or acute infarction.  Dense left vertebral artery atherosclerosis appears similar to the prior exam.  Paranasal sinuses and mastoid air cells appear clear. Mild age expected atrophy.  IMPRESSION: No acute abnormality or interval change. Mild atrophy.  Original Report Authenticated By: Andreas Newport, M.D.   EKG: Independently reviewed.  sinus rhythm. Left bundle branch block. Left bundle branch block old by comparison with 10/03/11 EKG.    Active Problems:  * No active hospital problems. *    Assessment/Plan 1. Bilateral upper and lower extremity weakness: Based on history and recent MRI my concern is for cord compression. Patient was able to walk several weeks ago and has now reached the point where he cannot walk or stand. He is no longer able to use his dominant hand to feed himself. MRI cervical and lumbar spine pending. Neurosurgery consultation pending. Empiric steroids. 2. Known cervical and lumbar spinal stenosis: Plan as above.  3. Normocytic anemia: Stable.  4. Diabetes mellitus: Stable. 5. DVT right upper extremity: Diagnosis 10/2011. Warfarin on hold pending surgical evaluation. Resume warfarin if no surgery pursued. 6. Constipation: Bowel regimen.  Discussed with Dr. Illene Regulus anticipated next week, no role for steroids. No need for repeat MRI.  Code Status: Full code  Family Communication: Discussed at bedside with wife Disposition Plan: Pending further evaluation   Time spent:  55 minutes  Brendia Sacks, MD  [facility] Pager [phone] If 7PM-7AM, please contact floor/night-coverage at [email or web address], password  [facility] 10/27/2011, 6:49 PM

## 2011-10-27 NOTE — H&P (Signed)
History and Physical  [name] ZOX:[id number] DOB: 1941-01-28 DOA: 10/27/2011  Referring physician: [name], PA PCP: [name], MD   Chief Complaint: back pain  HPI:  71 year old presented to the emergency department with complaint of upper back pain. Additional complaints include progressive weakness of bilateral lower extremities, urinary incontinence, increasing left upper extremity weakness. Patient was recently hospitalized earlier this month for frequent falls and gait abnormality and was referred to skilled nursing facility. Since that time he has progressively declined and has lost the ability to ambulate and eat using his left hand. He has a history of right arm weakness but is unable to clearly specify how long that has been an issue.  He reports several weeks ago he was able to ambulate and move his left hand and he can clearly no longer do these things by his report. Review of his chart is notable for cervical MRI performed 7/30 which revealed severe spinal stenosis, multilevel with marked cord flattening C4-5 and early edematous change in the cord at that level.  In the emergency department he was noted to have upper and lower extremity weakness bilaterally and admission was requested. Upon record review I requested the emergency Department provider contact neurosurgery for comment upon cervical MRI and my concern for cord compression. Neurosurgery consultation is pending at this time as are cervical spine and lumbar spine MRI.  History obtained from patient wife, vague and difficult to ascertain chronicity of complaints.  Chart Review:  Hospitalization: 7/1-10/07/2011--Frequent falls, gait abnormality secondary to lumbar spinal stenosis, complex regional pain syndrome RUE secondary to severe cervical spinal stenosis, acute DVT of RUE, anemia. Discharged to SNF.   Review of Systems:  Negative for fever, changes to his vision, sore throat, rash , chest pain,  bleeding, nausea. Positive for shortness of breath, numbness especially left arm and leg, urinary incontinence, constipation. Reports urinary incontinence is new.  Past Medical History  Diagnosis Date  . Diabetes mellitus   . Hypertension   . Hyperlipidemia   . Cervical spinal stenosis   . Lumbar spinal stenosis   . 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 DVT (deep venous thrombosis)     RUE 10/2011  . Anemia    Past Surgical History  Procedure Date  . Total hip arthroplasty   . Toe amputation    Social History:  reports that he has never smoked. He does not have any smokeless tobacco history on file. His alcohol and drug histories not on file.  No Known Allergies  Family History  Problem Relation Age of Onset  . Diabetes Brother     Prior to Admission medications   Medication Sig Start Date End Date Taking? Authorizing Provider  acarbose (PRECOSE) 25 MG tablet Take 25 mg by mouth 3 (three) times daily with meals.   Yes Historical Provider, MD  aspirin 81 MG chewable tablet Chew 81 mg by mouth daily.   Yes Historical Provider, MD  captopril (CAPOTEN) 25 MG tablet Take 25 mg by mouth 3 (three) times daily.   Yes Historical Provider, MD  cholecalciferol (VITAMIN D) 1000 UNITS tablet Take 2,000 Units by mouth daily.   Yes Historical Provider, MD  fluticasone (FLONASE) 50 MCG/ACT nasal spray Place 2 sprays into the nose daily. 10/07/11 10/06/12 Yes Christina P Rama, MD  glimepiride (AMARYL) 1 MG tablet Take 1 mg by mouth 2 (two) times daily.   Yes Historical Provider, MD  HYDROcodone-acetaminophen (NORCO/VICODIN) 5-325 MG per tablet Take 1  tablet by mouth every 4 (four) hours as needed. For pain   Yes Historical Provider, MD  magnesium hydroxide (MILK OF MAGNESIA) 400 MG/5ML suspension Take 30 mLs by mouth daily as needed.   Yes Historical Provider, MD  metFORMIN (GLUCOPHAGE) 1000 MG tablet Take 1,000 mg by mouth 2 (two) times daily with a meal.   Yes Historical  Provider, MD  methocarbamol (ROBAXIN) 500 MG tablet Take 500 mg by mouth every 8 (eight) hours as needed. For muscle spasm   Yes Historical Provider, MD  senna (SENOKOT) 8.6 MG TABS Take 1 tablet (8.6 mg total) by mouth 2 (two) times daily. 10/07/11  Yes Christina P Rama, MD  simvastatin (ZOCOR) 20 MG tablet Take 20 mg by mouth every evening.   Yes Historical Provider, MD  warfarin (COUMADIN) 6 MG tablet Take 6 mg by mouth daily.   Yes Historical Provider, MD  zolpidem (AMBIEN) 5 MG tablet Take 5 mg by mouth at bedtime as needed. For sleep   Yes Historical Provider, MD  benzoyl peroxide 5 % external liquid Apply topically 2 (two) times daily. 10/07/11 10/06/12  Maryruth Bun Rama, MD   Physical Exam: Filed Vitals:   10/27/11 1249 10/27/11 1631  BP: 128/82 137/68  Pulse: 82   Temp: 98.8 F (37.1 C) 98.2 F (36.8 C)  TempSrc: Oral Oral  Resp: 24   SpO2: 100% 100%    General:  examined in the emergency department. Appears calm and comfortable. Nontoxic.   Eyes:  pupils equal, round, react to light. Normal lids, irises, conjunctiva.   ENT: slightly hard of hearing. Lips and tongue appear unremarkable.   Neck:  no lymphadenopathy or masses. No thyromegaly.  Cardiovascular:  regular rate and rhythm. No murmur, rub, gallop. No lower extremity edema.   Respiratory:  clear to auscultation bilaterally. No wheezes, rales, rhonchi. Normal respiratory effort.  Abdomen:  soft, nontender, nondistended.   Skin:  appears grossly unremarkable without rash or induration.  Musculoskeletal: right upper extremity: Edematous, no grip strength. Unable to lift and or arm off the bed. No movement at shoulder. Left upper extremity: Weak grip strength 2/5 able to lift forearm and wrist off bed. Unable to abduct shoulder. Unable to touch mouth or head with his arm. Right lower extremity: Minimal movement of toes. Unable to lift upper or lower leg or significantly move. Left lower extremity: Able to flex knee  approximately 20. Able to wiggle toes. Unable to lift leg off bed .patellar reflexes 1+ bilaterally.   Psychiatric:  Grossly normal mood, odd affect.   Neurologic:  cranial nerves appear grossly intact. Sensation grossly intact upper and lower extremities bilaterally.   Labs on Admission:  Basic Metabolic Panel:  Lab 10/27/11 0981  NA 137  K 4.2  CL 101  CO2 24  GLUCOSE 102*  BUN 22  CREATININE 0.87  CALCIUM 9.7  MG --  PHOS --   CBC:  Lab 10/27/11 1342  WBC 6.3  NEUTROABS 4.2  HGB 10.4*  HCT 32.6*  MCV 84.0  PLT 238    Basename 10/27/11 1618  TROPIPOC 0.00   Radiological Exams on Admission: Dg Chest 1 View  10/27/2011  *RADIOLOGY REPORT*  Clinical Data: Fall 1 month ago.  Right hip pain. Back pain.  CHEST - 1 VIEW  Comparison: Two-view chest of 10/03/2011.  Findings: The heart size is normal.  The lungs are clear.  The visualized soft tissues and bony thorax are unremarkable.  Advanced degenerative changes are noted in the shoulders  bilaterally.  IMPRESSION: No acute cardiopulmonary disease.  Original Report Authenticated By: Jamesetta Orleans. MATTERN, M.D.   Dg Hip Complete Right  10/27/2011  *RADIOLOGY REPORT*  Clinical Data: Fall 1 month ago.  Progressive right-sided hip pain.  RIGHT HIP - COMPLETE 2+ VIEW  Comparison: Right hip radiograph 05/27/2009.  Findings: The patient is status post right hip arthroplasty.  The there is been no significant change in the position of the prosthesis.  There is no fracture. The hip is located.  Moderate degenerative changes are noted at the SI joints bilaterally as well as within the lumbar spine.  Mild left-sided hip degenerative changes are present.  Mild osteopenia is evident.  IMPRESSION:  1.  Status post right total hip arthroplasty without radiographic evidence for complication or change. 2.  Degenerative changes of the lower lumbar spine, SI joints, and left hip.  Original Report Authenticated By: Jamesetta Orleans. MATTERN, M.D.   Ct  Head Wo Contrast  10/27/2011  *RADIOLOGY REPORT*  Clinical Data: Severe headache.  CT HEAD WITHOUT CONTRAST  Technique:  Contiguous axial images were obtained from the base of the skull through the vertex without contrast.  Comparison: 10/03/2011  Findings: No mass lesion, mass effect, midline shift, hydrocephalus, hemorrhage.  No territorial ischemia or acute infarction.  Dense left vertebral artery atherosclerosis appears similar to the prior exam.  Paranasal sinuses and mastoid air cells appear clear. Mild age expected atrophy.  IMPRESSION: No acute abnormality or interval change. Mild atrophy.  Original Report Authenticated By: Andreas Newport, M.D.   EKG: Independently reviewed.  sinus rhythm. Left bundle branch block. Left bundle branch block old by comparison with 10/03/11 EKG.    Active Problems:  * No active hospital problems. *    Assessment/Plan 1. Bilateral upper and lower extremity weakness: Based on history and recent MRI my concern is for cord compression. Patient was able to walk several weeks ago and has now reached the point where he cannot walk or stand. He is no longer able to use his dominant hand to feed himself. MRI cervical and lumbar spine pending. Neurosurgery consultation pending. Empiric steroids. 2. Known cervical and lumbar spinal stenosis: Plan as above.  3. Normocytic anemia: Stable.  4. Diabetes mellitus: Stable. 5. DVT right upper extremity: Diagnosis 10/2011. Warfarin on hold pending surgical evaluation. Resume warfarin if no surgery pursued. 6. Constipation: Bowel regimen.  Code Status:  full code  Family Communication:  discussed at bedside with wife Disposition Plan:  pending further evaluation   Time spent:  76 minutes  Brendia Sacks, MD  [facility] Pager [phone] If 7PM-7AM, please contact floor/night-coverage at [email or web address], password [facility] 10/27/2011, 4:44 PM

## 2011-10-27 NOTE — ED Provider Notes (Signed)
History     CSN: [id number]  Arrival date & time 10/27/11  1232   First MD Initiated Contact with Patient 10/27/11 1249      Chief Complaint  Patient presents with  . Back Pain    (Consider location/radiation/quality/duration/timing/severity/associated sxs/prior treatment) The history is provided by the patient, the spouse and a caregiver.   71 y/o male accompanied by wife c/o excerbation of chronic upper back pain. Pt was seen for anemia and has been living at [facility] for rehab x3 weeks. Pt states that he has been getting progressively weak to the point that he cannot ambulate and he was able tobefore. Denies Focal weakness, change in vision,  chest pain, SOB.    Past Medical History  Diagnosis Date  . Diabetes mellitus   . Hypertension   . Hyperlipidemia     History reviewed. No pertinent past surgical history.  No family history on file.  History  Substance Use Topics  . Smoking status: Never Smoker   . Smokeless tobacco: Not on file  . Alcohol Use: Not on file      Review of Systems  Constitutional: Negative for fever.  HENT: Negative for rhinorrhea.   Cardiovascular: Negative for chest pain and palpitations.  Gastrointestinal: Negative for abdominal pain.  Genitourinary: Negative for dysuria.  Musculoskeletal: Positive for back pain.  Skin: Negative for rash.  Neurological: Positive for weakness. Negative for syncope, speech difficulty, numbness and headaches.  All other systems reviewed and are negative.    Allergies  Review of patient's allergies indicates no known allergies.  Home Medications   Current Outpatient Rx  Name Route Sig Dispense Refill  . ACARBOSE 50 MG PO TABS Oral Take 25 mg by mouth 3 (three) times daily with meals.    . ASPIRIN 81 MG PO TABS Oral Take 81 mg by mouth daily.    Marland Kitchen BENZOYL PEROXIDE 5 % EX LIQD Topical Apply topically 2 (two) times daily. 142 g 12  . CAPTOPRIL 25 MG PO TABS Oral Take 25 mg by mouth 3 (three) times  daily.    Marland Kitchen VITAMIN D 1000 UNITS PO TABS Oral Take 2,000 Units by mouth daily.    Marland Kitchen ENOXAPARIN SODIUM 100 MG/ML [place] SOLN Subcutaneous Inject 0.95 mLs (95 mg total) into the skin every 12 (twelve) hours. Take until INR therapeutic x 48 hours. 0 Syringe   . FLUTICASONE PROPIONATE 50 MCG/ACT NA SUSP Nasal Place 2 sprays into the nose daily.    Marland Kitchen GLIMEPIRIDE 1 MG PO TABS Oral Take 1 mg by mouth 2 (two) times daily.    Marland Kitchen METFORMIN HCL 500 MG PO TABS Oral Take 1,000 mg by mouth 2 (two) times daily with a meal.    . SENNA 8.6 MG PO TABS Oral Take 1 tablet (8.6 mg total) by mouth 2 (two) times daily. 120 each   . SIMVASTATIN 20 MG PO TABS Oral Take 20 mg by mouth every evening.    . WARFARIN SODIUM 7.5 MG PO TABS Oral Take 1 tablet (7.5 mg total) by mouth one time only at 6 PM. Take as directed by PCP with daily INR checks until stable.      BP 128/82  Pulse 82  Temp 98.8 F (37.1 C) (Oral)  Resp 24  SpO2 100%  Physical Exam  Vitals reviewed. Constitutional: He is oriented to person, place, and time. He appears well-developed. No distress.       Pt appears chronically ill  HENT:  Head: Normocephalic and  atraumatic.  Mouth/Throat: Oropharynx is clear and moist.  Eyes: Conjunctivae and EOM are normal. Pupils are equal, round, and reactive to light.  Neck: Normal range of motion. Neck supple.  Cardiovascular: Normal rate and regular rhythm.   Pulmonary/Chest: Effort normal.  Abdominal: Soft. Bowel sounds are normal.  Musculoskeletal: Normal range of motion.  Neurological: He is alert and oriented to person, place, and time. He has normal reflexes. No cranial nerve deficit.       Pt  Has profound weakness to all extremities, cannot illicit focal signs. Pt cannot lift arms, equally weak to bilateral legs  Psychiatric: He has a normal mood and affect.    ED Course  Procedures (including critical care time)  Labs Reviewed  CBC WITH DIFFERENTIAL - Abnormal; Notable for the following:    RBC  3.88 (*)     Hemoglobin 10.4 (*)     HCT 32.6 (*)     RDW 22.6 (*)     All other components within normal limits  BASIC METABOLIC PANEL - Abnormal; Notable for the following:    Glucose, Bld 102 (*)     GFR calc non Af Amer 85 (*)     All other components within normal limits  PROTIME-INR - Abnormal; Notable for the following:    Prothrombin Time 22.0 (*)     INR 1.89 (*)     All other components within normal limits  POCT I-STAT TROPONIN I  URINALYSIS, ROUTINE W REFLEX MICROSCOPIC   Dg Chest 1 View  10/27/2011  *RADIOLOGY REPORT*  Clinical Data: Fall 1 month ago.  Right hip pain. Back pain.  CHEST - 1 VIEW  Comparison: Two-view chest of 10/03/2011.  Findings: The heart size is normal.  The lungs are clear.  The visualized soft tissues and bony thorax are unremarkable.  Advanced degenerative changes are noted in the shoulders bilaterally.  IMPRESSION: No acute cardiopulmonary disease.  Original Report Authenticated By: Jamesetta Orleans. MATTERN, M.D.   Dg Hip Complete Right  10/27/2011  *RADIOLOGY REPORT*  Clinical Data: Fall 1 month ago.  Progressive right-sided hip pain.  RIGHT HIP - COMPLETE 2+ VIEW  Comparison: Right hip radiograph 05/27/2009.  Findings: The patient is status post right hip arthroplasty.  The there is been no significant change in the position of the prosthesis.  There is no fracture. The hip is located.  Moderate degenerative changes are noted at the SI joints bilaterally as well as within the lumbar spine.  Mild left-sided hip degenerative changes are present.  Mild osteopenia is evident.  IMPRESSION:  1.  Status post right total hip arthroplasty without radiographic evidence for complication or change. 2.  Degenerative changes of the lower lumbar spine, SI joints, and left hip.  Original Report Authenticated By: Jamesetta Orleans. MATTERN, M.D.   Ct Head Wo Contrast  10/27/2011  *RADIOLOGY REPORT*  Clinical Data: Severe headache.  CT HEAD WITHOUT CONTRAST  Technique:   Contiguous axial images were obtained from the base of the skull through the vertex without contrast.  Comparison: 10/03/2011  Findings: No mass lesion, mass effect, midline shift, hydrocephalus, hemorrhage.  No territorial ischemia or acute infarction.  Dense left vertebral artery atherosclerosis appears similar to the prior exam.  Paranasal sinuses and mastoid air cells appear clear. Mild age expected atrophy.  IMPRESSION: No acute abnormality or interval change. Mild atrophy.  Original Report Authenticated By: Andreas Newport, M.D.     1. Weakness       MDM  I recommend admitting  pateint for profound weakness. Dr. Irene Limbo with [facility] admit to med surg. I will obtain MRI of C and L spine and consult with neurosurgery.   I made neurosurgeon  Dr. Lovell Sheehan aware of the patient and pending MRI's. He advised a to call him back if the MRI show any acute findings otherwise, if DJD is present he recommends follow up as out patinet.    Date: 10/27/2011  Rate: 81  Rhythm: normal sinus rhythm  QRS Axis: normal  Intervals: normal  ST/T Wave abnormalities: nonspecific ST/T changes  Conduction Disutrbances:none and left bundle branch block  Narrative Interpretation:   Old EKG Reviewed: unchanged       [name], PA-C 10/27/11 1718  Consult By neurosurgeon Dr. Lovell Sheehan appreciated, who will evaluate the Pt in the ED.   [name], PA-C 10/27/11 1727

## 2011-10-27 NOTE — ED Notes (Signed)
transported to [employer] via Doctor, general practice per [employer]

## 2011-10-27 NOTE — ED Notes (Signed)
Sandwich bag meal given with Diet Coke; pt states can not feed himself; bedside tray accessible to wife who will assist pt in eating; pt's room currently at nursing station; hence, pt's door to room shut upon my exiting; pt angry that door is not left open; explained to pt that there was a lot of pt discussion at desk; therefore, due to reasons of privacy, pt doors needed to remain shut; pt becoming increasingly angry, demanding that the door be opened as he "can not breathe" if door is shut; pt's o2 sat 100% RA, resp 16, even, nonlabored, no resp distress noted; able to speak in complete sentences; placed pt on 2L o2/[place] as he feels he needs more oxygen; I then exited room

## 2011-10-27 NOTE — Consult Note (Signed)
Reason for Consult: Quadraparesis Referring Physician: Dr. Ander Gaster [name] is an 71 y.o. male.  HPI: The patient is a 71 year old white male is a complex medical history. Patient has had chronic neck and shoulder pain. In fact this had had shoulder surgery by Dr. Lajoyce Corners. Patient has had a complex regional pain syndrome of his right upper extremity. The patient was admitted to [facility] approximately 3 weeks ago with progressive weakness. At that time he was found to be anemic with a hemoglobin of approximately 7. The patient also underwent a cervical MRI at that time it was found to have severe cervical spinal stenosis. The patient was treated medically and subsequently transferred to [facility] for rehabilitation.  According to the patient and his family, while at [facility] the patient progressively gotten worse. They say that when he was transferred there he was able to walk independently with the use of a walker and was able to feed himself. The patient states that he has been quite unsteady on his feet and has taken frequent falls. He is got to the point where he cannot ambulate, is getting progressively weaker and has noted hand weakness and says "I cannot feed myself". The patient was sent back to the emergency department by [facility]. He was evaluated by Dr. Clair Gulling al and a neurosurgical consultation was requested.  Presently the patient is accompanied by his family and relates the above information. Complains of neck pain, arm pain, Quadraparesis, inability to walk, hand numbness, weakness and clumsiness as above. The patient also has a history of lumbago and lumbar spinal stenosis. He has been treated with injections by Dr. Alvester Morin with some success.  Past Medical History  Diagnosis Date  . Diabetes mellitus   . Hypertension   . Hyperlipidemia   . Cervical spinal stenosis   . Lumbar spinal stenosis   . Complex regional pain syndrome of right upper extremity    . DVT (deep venous thrombosis)     RUE 10/2011  . Anemia     Past Surgical History  Procedure Date  . Total hip arthroplasty   . Toe amputation     Family History  Problem Relation Age of Onset  . Diabetes Brother     Social History:  reports that he has never smoked. He does not have any smokeless tobacco history on file. His alcohol and drug histories not on file.  Allergies: No Known Allergies  Medications:  Prior to Admission:  Prescriptions prior to admission  Medication Sig Dispense Refill  . acarbose (PRECOSE) 25 MG tablet Take 25 mg by mouth 3 (three) times daily with meals.      Marland Kitchen aspirin 81 MG chewable tablet Chew 81 mg by mouth daily.      . captopril (CAPOTEN) 25 MG tablet Take 25 mg by mouth 3 (three) times daily.      . cholecalciferol (VITAMIN D) 1000 UNITS tablet Take 2,000 Units by mouth daily.      . fluticasone (FLONASE) 50 MCG/ACT nasal spray Place 2 sprays into the nose daily.      Marland Kitchen glimepiride (AMARYL) 1 MG tablet Take 1 mg by mouth 2 (two) times daily.      Marland Kitchen HYDROcodone-acetaminophen (NORCO/VICODIN) 5-325 MG per tablet Take 1 tablet by mouth every 4 (four) hours as needed. For pain      . magnesium hydroxide (MILK OF MAGNESIA) 400 MG/5ML suspension Take 30 mLs by mouth daily as needed.      Marland Kitchen  metFORMIN (GLUCOPHAGE) 1000 MG tablet Take 1,000 mg by mouth 2 (two) times daily with a meal.      . methocarbamol (ROBAXIN) 500 MG tablet Take 500 mg by mouth every 8 (eight) hours as needed. For muscle spasm      . senna (SENOKOT) 8.6 MG TABS Take 1 tablet (8.6 mg total) by mouth 2 (two) times daily.  120 each    . simvastatin (ZOCOR) 20 MG tablet Take 20 mg by mouth every evening.      . warfarin (COUMADIN) 6 MG tablet Take 6 mg by mouth daily.      Marland Kitchen zolpidem (AMBIEN) 5 MG tablet Take 5 mg by mouth at bedtime as needed. For sleep      . benzoyl peroxide 5 % external liquid Apply topically 2 (two) times daily.  142 g  12   Scheduled:   . captopril  25 mg  Oral TID  . dexamethasone  4 mg Intravenous Once  . enoxaparin (LOVENOX) injection  100 mg Subcutaneous Q12H  . fluticasone  2 spray Each Nare Daily  . insulin aspart  0-9 Units Subcutaneous TID WC  . morphine  4 mg Intravenous Once  . morphine  4 mg Intravenous Once  .  morphine injection  4 mg Intravenous Once  . ondansetron (ZOFRAN) IV  4 mg Intravenous Once  . ondansetron (ZOFRAN) IV  4 mg Intravenous Once  . ondansetron (ZOFRAN) IV  4 mg Intravenous Once  . simvastatin  20 mg Oral q1800  . sodium chloride  3 mL Intravenous Q12H  . DISCONTD: insulin aspart  0-9 Units Subcutaneous TID WC   Continuous:  ZOX:WRUEAV chloride, acetaminophen, acetaminophen, methocarbamol, morphine injection, ondansetron (ZOFRAN) IV, ondansetron, sodium chloride, zolpidem Anti-infectives    None      Results for orders placed during the hospital encounter of 10/27/11 (from the past 48 hour(s))  CBC WITH DIFFERENTIAL     Status: Abnormal   Collection Time   10/27/11  1:42 PM      Component Value Range Comment   WBC 6.3  4.0 - 10.5 K/uL    RBC 3.88 (*) 4.22 - 5.81 MIL/uL    Hemoglobin 10.4 (*) 13.0 - 17.0 g/dL    HCT 40.9 (*) 81.1 - 52.0 %    MCV 84.0  78.0 - 100.0 fL    MCH 26.8  26.0 - 34.0 pg    MCHC 31.9  30.0 - 36.0 g/dL    RDW 91.4 (*) 78.2 - 15.5 %    Platelets 238  150 - 400 K/uL    Neutrophils Relative 66  43 - 77 %    Lymphocytes Relative 23  12 - 46 %    Monocytes Relative 8  3 - 12 %    Eosinophils Relative 2  0 - 5 %    Basophils Relative 1  0 - 1 %    Neutro Abs 4.2  1.7 - 7.7 K/uL    Lymphs Abs 1.4  0.7 - 4.0 K/uL    Monocytes Absolute 0.5  0.1 - 1.0 K/uL    Eosinophils Absolute 0.1  0.0 - 0.7 K/uL    Basophils Absolute 0.1  0.0 - 0.1 K/uL    Smear Review MORPHOLOGY UNREMARKABLE     BASIC METABOLIC PANEL     Status: Abnormal   Collection Time   10/27/11  1:42 PM      Component Value Range Comment   Sodium 137  135 - 145 mEq/L  Potassium 4.2  3.5 - 5.1 mEq/L    Chloride  101  96 - 112 mEq/L    CO2 24  19 - 32 mEq/L    Glucose, Bld 102 (*) 70 - 99 mg/dL    BUN 22  6 - 23 mg/dL    Creatinine, Ser 4.69  0.50 - 1.35 mg/dL    Calcium 9.7  8.4 - 62.9 mg/dL    GFR calc non Af Amer 85 (*) >90 mL/min    GFR calc Af Amer >90  >90 mL/min   PROTIME-INR     Status: Abnormal   Collection Time   10/27/11  2:55 PM      Component Value Range Comment   Prothrombin Time 22.0 (*) 11.6 - 15.2 seconds    INR 1.89 (*) 0.00 - 1.49   POCT I-STAT TROPONIN I     Status: Normal   Collection Time   10/27/11  4:18 PM      Component Value Range Comment   Troponin i, poc 0.00  0.00 - 0.08 ng/mL    Comment 3              Dg Chest 1 View  10/27/2011  *RADIOLOGY REPORT*  Clinical Data: Fall 1 month ago.  Right hip pain. Back pain.  CHEST - 1 VIEW  Comparison: Two-view chest of 10/03/2011.  Findings: The heart size is normal.  The lungs are clear.  The visualized soft tissues and bony thorax are unremarkable.  Advanced degenerative changes are noted in the shoulders bilaterally.  IMPRESSION: No acute cardiopulmonary disease.  Original Report Authenticated By: Jamesetta Orleans. MATTERN, M.D.   Dg Hip Complete Right  10/27/2011  *RADIOLOGY REPORT*  Clinical Data: Fall 1 month ago.  Progressive right-sided hip pain.  RIGHT HIP - COMPLETE 2+ VIEW  Comparison: Right hip radiograph 05/27/2009.  Findings: The patient is status post right hip arthroplasty.  The there is been no significant change in the position of the prosthesis.  There is no fracture. The hip is located.  Moderate degenerative changes are noted at the SI joints bilaterally as well as within the lumbar spine.  Mild left-sided hip degenerative changes are present.  Mild osteopenia is evident.  IMPRESSION:  1.  Status post right total hip arthroplasty without radiographic evidence for complication or change. 2.  Degenerative changes of the lower lumbar spine, SI joints, and left hip.  Original Report Authenticated By: Jamesetta Orleans.  MATTERN, M.D.   Ct Head Wo Contrast  10/27/2011  *RADIOLOGY REPORT*  Clinical Data: Severe headache.  CT HEAD WITHOUT CONTRAST  Technique:  Contiguous axial images were obtained from the base of the skull through the vertex without contrast.  Comparison: 10/03/2011  Findings: No mass lesion, mass effect, midline shift, hydrocephalus, hemorrhage.  No territorial ischemia or acute infarction.  Dense left vertebral artery atherosclerosis appears similar to the prior exam.  Paranasal sinuses and mastoid air cells appear clear. Mild age expected atrophy.  IMPRESSION: No acute abnormality or interval change. Mild atrophy.  Original Report Authenticated By: Andreas Newport, M.D.    Review of systems: As above Blood pressure 137/68, pulse 82, temperature 98.2 F (36.8 C), temperature source Oral, resp. rate 24, SpO2 100.00%. Physical exam:  Gen.: The patient is an unhealthy appearing 71 year old white male in no apparent distress.  HEENT: Normocephalic, atraumatic, pupils are equal, oropharynx benign  Neck: The patient has a very limited cervical range of motion. Spurling's testing was negative causing neck pain. Lhermitte sign was  not present. There are no masses or obvious deformities.  Thorax: Symmetric  Abdomen: Soft  Back exam: Unremarkable  Neurologic exam: The patient is alert and oriented x3. Glasgow Coma Scale 15. Cranial nerves II through XII were tested bilaterally and grossly normal except as follows: Patient has decreased vision in his right eye and decreased hearing in his left ear. The patient's motor strength is as follows: The patient is quadriparetic with 0/5 deltoid strength bilaterally, the patient's hand grip strength is your over 5 bilaterally, patient's right bicep, tricep, quadricep, gastrocnemius is 3+ to 4 minus/5. The patient's left bicep, tricep, quadricep, and gastrocnemius is 4 minus over 5. The patient left EHL is 4 minus over 5 on the right EHL is 0/5. Sensory exam  demonstrates grossly normal light touch sensation in his bilateral lower extremities. Deep tendon reflexes are 2+ over 4 his bile biceps quadriceps and absent in his bilateral gastrocnemius. He has a 2 beat nonsustained ankle clonus bilaterally. Cerebellar function was not able to be tested.  Assessment/Plan: C4-5 and C5-6 severe spinal stenosis, spondylosis, cervical myelopathy/radiculopathy, cervicalgia, Quadraparesis: I discussed situation with the patient and his family. We have discussed the various treatment options. While the patient does not appear to be a great surgical candidate, he appears to be getting gradually worse without intervention. We have therefore discussed the surgical option of a C4-5 and C5-6 anterior cervical discectomy, fusion, and plating. I have described the surgery to them, we have discussed the risks, benefits, alternatives, and likelihood of achieving our goals with surgery. I have clearly explained to them that he has spinal cord damage already and that " there is no way to fix that". I've explained that the purpose of the surgery is to hopefully prevent this from getting worse and with any luck perhaps he will get a bit better. But I clearly explained that he will not return to normal. I've answered all her questions regarding surgery. The patient would like to proceed with the operation. I will tentatively plan is for Monday assuming we can get medical clearance. I discussed situation with the hospitalist. I don't think he needs another MRI scan. I don't see any role for steroids.  Multiple medical issues: The hospitalist are going to admit the patient and optimize his medical conditions.  Cortnee Steinmiller D 10/27/2011, 7:12 PM

## 2011-10-27 NOTE — ED Notes (Signed)
Pt's wife again opening door to room; I again explained to pt and wife the rationale for keeping the door shut; pt states "You did not turn the damn oxygen on in my nose, I can't breathe"; no change from previous resp assessment; pt now demanding that oxygen tubing be removed from his nose; pt further stating "you did not give me any medication for my pain"; explained to pt that I did administer pain meds as well as antiemetics; pt continues to disagree with me; explained to pt that we were going to adm him and as soon as a bed assignment was made then we would transport him upstairs

## 2011-10-27 NOTE — Progress Notes (Signed)
ANTICOAGULATION CONSULT NOTE - Initial Consult  Pharmacy Consult for Lovenox Indication: subacute RUE DVT  No Known Allergies   Vital Signs: Temp: 98.2 F (36.8 C) (07/25 1631) Temp src: Oral (07/25 1631) BP: 137/68 mmHg (07/25 1631) Pulse Rate: 82  (07/25 1249)  Labs:  Basename 10/27/11 1455 10/27/11 1342  HGB -- 10.4*  HCT -- 32.6*  PLT -- 238  APTT -- --  LABPROT 22.0* --  INR 1.89* --  HEPARINUNFRC -- --  CREATININE -- 0.87  CKTOTAL -- --  CKMB -- --  TROPONINI -- --    The CrCl is unknown because both a height and weight (above a minimum accepted value) are required for this calculation.   Medical History: Past Medical History  Diagnosis Date  . Diabetes mellitus   . Hypertension   . Hyperlipidemia   . Cervical spinal stenosis   . Lumbar spinal stenosis   . 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 DVT (deep venous thrombosis)     RUE 10/2011  . Anemia     Medications:  Prescriptions prior to admission  Medication Sig Dispense Refill  . acarbose (PRECOSE) 25 MG tablet Take 25 mg by mouth 3 (three) times daily with meals.      Marland Kitchen aspirin 81 MG chewable tablet Chew 81 mg by mouth daily.      . captopril (CAPOTEN) 25 MG tablet Take 25 mg by mouth 3 (three) times daily.      . cholecalciferol (VITAMIN D) 1000 UNITS tablet Take 2,000 Units by mouth daily.      . fluticasone (FLONASE) 50 MCG/ACT nasal spray Place 2 sprays into the nose daily.      Marland Kitchen glimepiride (AMARYL) 1 MG tablet Take 1 mg by mouth 2 (two) times daily.      Marland Kitchen HYDROcodone-acetaminophen (NORCO/VICODIN) 5-325 MG per tablet Take 1 tablet by mouth every 4 (four) hours as needed. For pain      . magnesium hydroxide (MILK OF MAGNESIA) 400 MG/5ML suspension Take 30 mLs by mouth daily as needed.      . metFORMIN (GLUCOPHAGE) 1000 MG tablet Take 1,000 mg by mouth 2 (two) times daily with a meal.      . methocarbamol (ROBAXIN) 500 MG tablet Take 500 mg by mouth every 8 (eight) hours  as needed. For muscle spasm      . senna (SENOKOT) 8.6 MG TABS Take 1 tablet (8.6 mg total) by mouth 2 (two) times daily.  120 each    . simvastatin (ZOCOR) 20 MG tablet Take 20 mg by mouth every evening.      . warfarin (COUMADIN) 6 MG tablet Take 6 mg by mouth daily.      Marland Kitchen zolpidem (AMBIEN) 5 MG tablet Take 5 mg by mouth at bedtime as needed. For sleep      . benzoyl peroxide 5 % external liquid Apply topically 2 (two) times daily.  142 g  12    Assessment: 71 y/o male patient admitted with likely spinal cord compression. H/o subacute RUE DVT, diagnosed earlier this month. INR subtherapeutic, begin lovenox in place of coumadin as patient may require surgery.   Goal of Therapy:  Anti-Xa level 0.6-1.2 units/ml 4hrs after LMWH dose given Monitor platelets by anticoagulation protocol: Yes   Plan:  Lovenox 100mg  sq q12h, cbc q72h and f/u anticoag plans.  Verlene Mayer, PharmD, BCPS Pager [phone] 10/27/2011,7:07 PM

## 2011-10-27 NOTE — ED Notes (Signed)
Dr. Lovell Sheehan in to assess - pt to be transported to floor after his assessment

## 2011-10-27 NOTE — ED Notes (Signed)
From xray dept via stretcher per xray staff

## 2011-10-28 ENCOUNTER — Other Ambulatory Visit: Payer: Self-pay | Admitting: Neurosurgery

## 2011-10-28 DIAGNOSIS — I82A19 Acute embolism and thrombosis of unspecified axillary vein: Secondary | ICD-10-CM

## 2011-10-28 DIAGNOSIS — M4802 Spinal stenosis, cervical region: Secondary | ICD-10-CM | POA: Diagnosis present

## 2011-10-28 DIAGNOSIS — M47812 Spondylosis without myelopathy or radiculopathy, cervical region: Secondary | ICD-10-CM | POA: Diagnosis present

## 2011-10-28 DIAGNOSIS — G8250 Quadriplegia, unspecified: Secondary | ICD-10-CM

## 2011-10-28 DIAGNOSIS — M4712 Other spondylosis with myelopathy, cervical region: Secondary | ICD-10-CM

## 2011-10-28 DIAGNOSIS — G959 Disease of spinal cord, unspecified: Secondary | ICD-10-CM | POA: Diagnosis present

## 2011-10-28 DIAGNOSIS — E119 Type 2 diabetes mellitus without complications: Secondary | ICD-10-CM

## 2011-10-28 LAB — GLUCOSE, CAPILLARY
Glucose-Capillary: 132 mg/dL — ABNORMAL HIGH (ref 70–99)
Glucose-Capillary: 132 mg/dL — ABNORMAL HIGH (ref 70–99)

## 2011-10-28 MED ORDER — MORPHINE SULFATE 2 MG/ML IJ SOLN
2.0000 mg | INTRAMUSCULAR | Status: DC | PRN
  Administered 2011-10-28 – 2011-10-31 (×14): 2 mg via INTRAVENOUS
  Filled 2011-10-28 (×15): qty 1

## 2011-10-28 MED ORDER — PHENYLEPHRINE HCL 0.5 % NA SOLN
1.0000 [drp] | Freq: Once | NASAL | Status: AC
  Administered 2011-10-28: 1 [drp] via NASAL
  Filled 2011-10-28: qty 15

## 2011-10-28 MED ORDER — SENNA 8.6 MG PO TABS
2.0000 | ORAL_TABLET | Freq: Every day | ORAL | Status: DC
  Administered 2011-10-28 – 2011-11-07 (×10): 17.2 mg via ORAL
  Filled 2011-10-28 (×12): qty 2

## 2011-10-28 MED ORDER — POLYETHYLENE GLYCOL 3350 17 G PO PACK
17.0000 g | PACK | Freq: Every day | ORAL | Status: DC
  Administered 2011-10-28 – 2011-11-08 (×10): 17 g via ORAL
  Filled 2011-10-28 (×12): qty 1

## 2011-10-28 MED ORDER — DOCUSATE SODIUM 100 MG PO CAPS
100.0000 mg | ORAL_CAPSULE | Freq: Two times a day (BID) | ORAL | Status: DC
  Administered 2011-10-28 – 2011-11-08 (×20): 100 mg via ORAL
  Filled 2011-10-28 (×24): qty 1

## 2011-10-28 NOTE — Progress Notes (Signed)
[facility] PROGRESS NOTE  DONTRAY HABERLAND ZOX:[id number] DOB: Jan 11, 1941 DOA: 10/27/2011 PCP: [name], MD  Assessment/Plan: 1. C4-5 and C5-6 severe spinal stenosis, spondylosis, cervical myelopathy/radiculopathy, cervicalgia, quadriparesis: Neurosurgery consultation appreciated. Surgery planned 7/29. Patient has no history of coronary disease and no history of chest pain. No further evaluation is suggested. 2. Normocytic anemia: Stable. Etiology unclear. Followup as an outpatient. 3. Diabetes mellitus: Stable. Continue sliding scale insulin. Hold metformin while in inpatient. Resume oral antihypoglycemic after surgery. 4. HTN: Stable. Continue captopril. 5. DVT right upper extremity: Diagnosis 10/2011. Warfarin on hold pending surgery. Therapeutic Lovenox 7/26-7/27. Start heparin 7/28 in anticipation of surgery 7/29. 6. Constipation: Bowel regimen.  Code Status: Full code  Family Communication: Discussed at bedside with wife  Disposition Plan: Pending further evaluation   Brendia Sacks, MD  [facility] Pager [phone]. If 7PM-7AM, please contact night-coverage at [email or web address], password [email or web address] 10/28/2011, 9:11 AM  LOS: 1 day   Brief narrative: 71 year old presented to the emergency department with complaint of upper back pain. Additional complaints include progressive weakness of bilateral lower extremities, urinary incontinence, increasing left upper extremity weakness.   Consultants:  Neurosurgery  Procedures:    HPI/Subjective: Afebrile, VSS. No new complaints.  Objective: Filed Vitals:   10/27/11 1631 10/27/11 1942 10/28/11 0035 10/28/11 0442  BP: 137/68 154/81 144/73 146/76  Pulse:  92 83 82  Temp: 98.2 F (36.8 C) 98 F (36.7 C) 98.2 F (36.8 C) 98.4 F (36.9 C)  TempSrc: Oral Oral Oral Oral  Resp:  20 18 20   Height:  5\' 8"  (1.727 m)    Weight:  81.3 kg (179 lb 3.7 oz)    SpO2: 100% 98% 100% 95%    Intake/Output Summary (Last 24 hours) at  10/28/11 0911 Last data filed at 10/28/11 0048  Gross per 24 hour  Intake      3 ml  Output   1400 ml  Net  -1397 ml    Exam:   General:  Appears calm and comfortable. Speech fluent and clear.  Cardiovascular: Regular rate and rhythm. No rub or gallop. 1+ bilateral lower extremity edema.  Respiratory: Clear to auscultation bilaterally. No wheezes, rales, rhonchi. Normal respiratory effort.  Abdomen: Soft, nontender, nondistended.  Musculoskeletal: No significant change in upper and lower extremity strength.  Data Reviewed: Basic Metabolic Panel:  Lab 10/27/11 8295  NA 137  K 4.2  CL 101  CO2 24  GLUCOSE 102*  BUN 22  CREATININE 0.87  CALCIUM 9.7  MG --  PHOS --   CBC:  Lab 10/27/11 1342  WBC 6.3  NEUTROABS 4.2  HGB 10.4*  HCT 32.6*  MCV 84.0  PLT 238   CBG:  Lab 10/28/11 0755 10/28/11 0315 10/27/11 2128  GLUCAP 132* 103* 101*   Recent Results (from the past 240 hour(s))  MRSA PCR SCREENING     Status: Normal   Collection Time   10/27/11  7:09 PM      Component Value Range Status Comment   MRSA by PCR NEGATIVE  NEGATIVE Final     Studies:  Scheduled Meds:   . captopril  25 mg Oral TID  . dexamethasone  4 mg Intravenous Once  . enoxaparin (LOVENOX) injection  100 mg Subcutaneous Q12H  . fluticasone  2 spray Each Nare Daily  . insulin aspart  0-9 Units Subcutaneous TID WC  . morphine  4 mg Intravenous Once  . morphine  4 mg Intravenous Once  .  morphine injection  4 mg  Intravenous Once  . ondansetron (ZOFRAN) IV  4 mg Intravenous Once  . ondansetron (ZOFRAN) IV  4 mg Intravenous Once  . ondansetron (ZOFRAN) IV  4 mg Intravenous Once  . phenylephrine  1 drop Each Nare Once  . simvastatin  20 mg Oral q1800  . sodium chloride  3 mL Intravenous Q12H  . DISCONTD: insulin aspart  0-9 Units Subcutaneous TID WC   Continuous Infusions:   Principal Problem:  *Quadriparesis Active Problems:  Spondylosis of cervical joint  Cervical myelopathy  DVT  of right proximal brachial through the distal axillary vein, acute  Cervical stenosis of spine  Diabetes mellitus, type 2  Complex regional pain syndrome of right upper extremity secondary to severe cervical spinal stenosis  Constipation due to pain medication     Brendia Sacks, MD  [facility] Pager [phone]. If 7PM-7AM, please contact night-coverage at [email or web address], password [facility] 10/28/2011, 9:11 AM  LOS: 1 day   Time spent: 20 minutes

## 2011-10-28 NOTE — ED Provider Notes (Signed)
Medical screening examination/treatment/procedure(s) were conducted as a shared visit with non-physician practitioner(s) and myself.  I personally evaluated the patient during the encounter  Progressive decline in motor function and ability to use extremities since discharge 3 weeks ago. Unable to ambulate or feed self.  Cervical spinal stenosis on last MRI with cord edema.  No grip strength on R, unable to lift arm from bed. Weak grip strength on L. Unable to lift legs from bed.  Glynn Octave, MD 10/28/11 [phone]

## 2011-10-28 NOTE — Care Management Note (Signed)
    Page 1 of 1   10/28/2011     4:22:32 PM   CARE MANAGEMENT NOTE 10/28/2011  Patient:  [name], [name]   Account Number:  [id number]  Date Initiated:  10/28/2011  Documentation initiated by:  Letha Cape  Subjective/Objective Assessment:   dx cervical myelopathy, quadraparesis  admit- from [facility].     Action/Plan:   c4-5, c5-6 anteiror cervical discectomy, fusion and plating on Monday 7/29.   Anticipated DC Date:  11/02/2011   Anticipated DC Plan:  SKILLED NURSING FACILITY  In-house referral  Clinical Social Worker      DC Planning Services  CM consult      Choice offered to / List presented to:             Status of service:  In process, will continue to follow Medicare Important Message given?   (If response is "NO", the following Medicare IM given date fields will be blank) Date Medicare IM given:   Date Additional Medicare IM given:    Discharge Disposition:    Per UR Regulation:  Reviewed for med. necessity/level of care/duration of stay  If discussed at Long Length of Stay Meetings, dates discussed:    Comments:  10/28/11 16:21 Letha Cape RN, BSN [id number]  patient is from [facility]- CSW referral. Patient for surgery on Monday 7/29.

## 2011-10-28 NOTE — Progress Notes (Signed)
Patient ID: [name], male   DOB: March 20, 1941, 71 y.o.   MRN: [id number] Subjective:  The patient is alert and pleasant. I have answered all his questions regarding surgery. We are planning for Monday afternoon.  Objective: Vital signs in last 24 hours: Temp:  [98 F (36.7 C)-98.8 F (37.1 C)] 98.4 F (36.9 C) (07/26 0442) Pulse Rate:  [82-92] 82  (07/26 0442) Resp:  [18-24] 20  (07/26 0442) BP: (128-154)/(68-82) 146/76 mmHg (07/26 0442) SpO2:  [95 %-100 %] 95 % (07/26 0442) Weight:  [81.3 kg (179 lb 3.7 oz)] 81.3 kg (179 lb 3.7 oz) (07/25 1942)  Intake/Output from previous day: 07/25 0701 - 07/26 0700 In: 3 [I.V.:3] Out: 1400 [Urine:1400] Intake/Output this shift:    Physical exam the patient's physical exam is as documented yesterday. I.e. there has been no change in his strength.  Lab Results:  [facility] 10/27/11 1342  WBC 6.3  HGB 10.4*  HCT 32.6*  PLT 238   BMET  Basename 10/27/11 1342  NA 137  K 4.2  CL 101  CO2 24  GLUCOSE 102*  BUN 22  CREATININE 0.87  CALCIUM 9.7    Studies/Results: Dg Chest 1 View  10/27/2011  *RADIOLOGY REPORT*  Clinical Data: Fall 1 month ago.  Right hip pain. Back pain.  CHEST - 1 VIEW  Comparison: Two-view chest of 10/03/2011.  Findings: The heart size is normal.  The lungs are clear.  The visualized soft tissues and bony thorax are unremarkable.  Advanced degenerative changes are noted in the shoulders bilaterally.  IMPRESSION: No acute cardiopulmonary disease.  Original Report Authenticated By: Jamesetta Orleans. MATTERN, M.D.   Dg Hip Complete Right  10/27/2011  *RADIOLOGY REPORT*  Clinical Data: Fall 1 month ago.  Progressive right-sided hip pain.  RIGHT HIP - COMPLETE 2+ VIEW  Comparison: Right hip radiograph 05/27/2009.  Findings: The patient is status post right hip arthroplasty.  The there is been no significant change in the position of the prosthesis.  There is no fracture. The hip is located.  Moderate degenerative changes  are noted at the SI joints bilaterally as well as within the lumbar spine.  Mild left-sided hip degenerative changes are present.  Mild osteopenia is evident.  IMPRESSION:  1.  Status post right total hip arthroplasty without radiographic evidence for complication or change. 2.  Degenerative changes of the lower lumbar spine, SI joints, and left hip.  Original Report Authenticated By: Jamesetta Orleans. MATTERN, M.D.   Ct Head Wo Contrast  10/27/2011  *RADIOLOGY REPORT*  Clinical Data: Severe headache.  CT HEAD WITHOUT CONTRAST  Technique:  Contiguous axial images were obtained from the base of the skull through the vertex without contrast.  Comparison: 10/03/2011  Findings: No mass lesion, mass effect, midline shift, hydrocephalus, hemorrhage.  No territorial ischemia or acute infarction.  Dense left vertebral artery atherosclerosis appears similar to the prior exam.  Paranasal sinuses and mastoid air cells appear clear. Mild age expected atrophy.  IMPRESSION: No acute abnormality or interval change. Mild atrophy.  Original Report Authenticated By: Andreas Newport, M.D.    Assessment/Plan: C4-5 and C5-6 spondylosis, severe stenosis, cervical myelopathy, Quadraparesis: I've answered all the patient's questions regarding surgery he wants to proceed with a C4-5 and C5-6 anterior cervicectomy fusion and plating. We are planning for Monday afternoon.  LOS: 1 day     Ginny Loomer D 10/28/2011, 8:50 AM

## 2011-10-29 LAB — GLUCOSE, CAPILLARY
Glucose-Capillary: 109 mg/dL — ABNORMAL HIGH (ref 70–99)
Glucose-Capillary: 162 mg/dL — ABNORMAL HIGH (ref 70–99)

## 2011-10-29 MED ORDER — ENOXAPARIN SODIUM 80 MG/0.8ML ~~LOC~~ SOLN
80.0000 mg | Freq: Two times a day (BID) | SUBCUTANEOUS | Status: DC
  Filled 2011-10-29 (×2): qty 0.8

## 2011-10-29 MED ORDER — NAPHAZOLINE-GLYCERIN 0.012-0.2 % OP SOLN
1.0000 [drp] | OPHTHALMIC | Status: DC | PRN
  Administered 2011-11-01: 1 [drp] via OPHTHALMIC
  Administered 2011-11-02 – 2011-11-05 (×3): 2 [drp] via OPHTHALMIC
  Filled 2011-10-29 (×3): qty 15

## 2011-10-29 MED ORDER — ENOXAPARIN SODIUM 80 MG/0.8ML ~~LOC~~ SOLN
80.0000 mg | Freq: Two times a day (BID) | SUBCUTANEOUS | Status: AC
  Administered 2011-10-29: 80 mg via SUBCUTANEOUS
  Filled 2011-10-29: qty 0.8

## 2011-10-29 NOTE — Progress Notes (Signed)
Called into room to help reposition patient with assistance of nurse tech. Patient says angrily, " you all keep waking me up and making me miserable, now I'm going to make you miserable!" Tech explained to patient that in a hospital it is necessary to wake the patient up to treat him, do finger sticks, etc. Patient demanding to be placed "on my back!". Patient gently repositioned to his back but he continues to insist that he is not on his back. He shouted at myself and tech stating we "don't know what you're doing! You don't know what you're looking at!" Attempts to reposition the patient and make him comfortable are met with more angry shouting. Charge nurse made aware. Lurline Idol [facility]

## 2011-10-29 NOTE — Progress Notes (Signed)
[facility] PROGRESS NOTE  RUSELL MENEELY ZOX:[id number] DOB: [birth year] DOA: 10/27/2011 PCP: [name], MD  Assessment/Plan: 1. C4-5 and C5-6 severe spinal stenosis, spondylosis, cervical myelopathy/radiculopathy, cervicalgia, quadriparesis: Neurosurgery consultation appreciated. Surgery planned 7/29. Patient has no history of coronary disease and no history of chest pain. No further evaluation is suggested. 2. Normocytic anemia: Stable. Etiology unclear. Followup as an outpatient. 3. Diabetes mellitus: Stable. Continue sliding scale insulin. Hold metformin while in inpatient. Resume oral antihypoglycemic after surgery. 4. HTN: Stable. Continue captopril. 5. DVT right upper extremity: Diagnosis 10/2011. Warfarin on hold pending surgery. Therapeutic Lovenox 7/26-7/27. Start heparin 7/28 in anticipation of surgery 7/29. 6. Constipation: Bowel regimen.  Code Status: Full code  Family Communication: Discussed at bedside with wife  Disposition Plan: Pending further evaluation   Brendia Sacks, MD  [facility] Pager [phone]. If 7PM-7AM, please contact night-coverage at [email or web address], password [facility] 10/29/2011, 2:11 PM  LOS: 2 days   Brief narrative: 71 year [name] presented to the emergency department with complaint of upper back pain. Additional complaints include progressive weakness of bilateral lower extremities, urinary incontinence, increasing left upper extremity weakness.   Consultants:  Neurosurgery  Procedures:    HPI/Subjective: No new complaints.  Objective: Filed Vitals:   10/28/11 0442 10/28/11 1400 10/28/11 2129 10/29/11 0620  BP: 146/76 121/69 142/72 149/76  Pulse: 82 83 83 73  Temp: 98.4 F (36.9 C) 98.2 F (36.8 C) 98.7 F (37.1 C) 98 F (36.7 C)  TempSrc: Oral Oral Oral Oral  Resp: 20 14 18 20   Height:      Weight:      SpO2: 95% 95% 93% 94%    Intake/Output Summary (Last 24 hours) at 10/29/11 1411 Last data filed at 10/29/11 1201  Gross per 24 hour  Intake    600 ml  Output   3600 ml  Net  -3000 ml    Exam:   General:  Appears calm and comfortable. Speech fluent and clear.  Cardiovascular: Regular rate and rhythm. No rub or gallop. 1+ bilateral lower extremity edema.  Respiratory: Clear to auscultation bilaterally. No wheezes, rales, rhonchi. Normal respiratory effort.  Data Reviewed: Basic Metabolic Panel:  Lab 10/27/11 8295  NA 137  K 4.2  CL 101  CO2 24  GLUCOSE 102*  BUN 22  CREATININE 0.87  CALCIUM 9.7  MG --  PHOS --   CBC:  Lab 10/27/11 1342  WBC 6.3  NEUTROABS 4.2  HGB 10.4*  HCT 32.6*  MCV 84.0  PLT 238   CBG:  Lab 10/29/11 1204 10/29/11 0753 10/28/11 2148 10/28/11 1651 10/28/11 1216  GLUCAP 162* 109* 117* 158* 132*   Recent Results (from the past 240 hour(s))  MRSA PCR SCREENING     Status: Normal   Collection Time   10/27/11  7:09 PM      Component Value Range Status Comment   MRSA by PCR NEGATIVE  NEGATIVE Final     Studies:  Scheduled Meds:    . captopril  25 mg Oral TID  . docusate sodium  100 mg Oral BID  . enoxaparin (LOVENOX) injection  80 mg Subcutaneous Q12H  . fluticasone  2 spray Each Nare Daily  . insulin aspart  0-9 Units Subcutaneous TID WC  . polyethylene glycol  17 g Oral Daily  . senna  2 tablet Oral QHS  . simvastatin  20 mg Oral q1800  . sodium chloride  3 mL Intravenous Q12H  . DISCONTD: enoxaparin (LOVENOX) injection  100 mg Subcutaneous Q12H  Continuous Infusions:   Principal Problem:  *Quadriparesis Active Problems:  Spondylosis of cervical joint  Cervical myelopathy  DVT of right proximal brachial through the distal axillary vein, acute  Cervical stenosis of spine  Diabetes mellitus, type 2  Complex regional pain syndrome of right upper extremity secondary to severe cervical spinal stenosis  Constipation due to pain medication     Brendia Sacks, MD  [facility] Pager [phone]. If 7PM-7AM, please contact  night-coverage at [email or web address], password [facility] 10/29/2011, 2:11 PM  LOS: 2 days   Time spent: 15 minutes

## 2011-10-29 NOTE — Progress Notes (Signed)
Patient ID: [name], male   DOB: Nov 04, 1940, 71 y.o.   MRN: [id number] Patient stable neurologically. For surgery Monday with Dr Lovell Sheehan. He wishes to proceed as scheduled.

## 2011-10-29 NOTE — Progress Notes (Signed)
ANTICOAGULATION CONSULT NOTE - Follow Up Consult  Pharmacy Consult for Lovenox Indication: hx RUE DVT  No Known Allergies  Patient Measurements: Height: 5\' 8"  (172.7 cm) Weight: 179 lb 3.7 oz (81.3 kg) IBW/kg (Calculated) : 68.4   Vital Signs: Temp: 98 F (36.7 C) (07/27 0620) Temp src: Oral (07/27 0620) BP: 149/76 mmHg (07/27 0620) Pulse Rate: 73  (07/27 0620)  Labs:  Basename 10/28/11 0525 10/27/11 1455 10/27/11 1342  HGB -- -- 10.4*  HCT -- -- 32.6*  PLT -- -- 238  APTT -- -- --  LABPROT 21.2* 22.0* --  INR 1.80* 1.89* --  HEPARINUNFRC -- -- --  CREATININE -- -- 0.87  CKTOTAL -- -- --  CKMB -- -- --  TROPONINI -- -- --    Estimated Creatinine Clearance: 76.4 ml/min (by C-G formula based on Cr of 0.87).  Assessment:   Coumadin on hold for surgery 7/29.  Had been on Coumadin 6 mg daily prior to admission, last dose uncertain (7/24?). INR 1.80 yesterday.  Currently on Lovenox 100 mg SQ q12hrs at 8am & 8pm, but weight down to 81.5 kg 7/26, just re-weighed to verify = 78.5 kg.  Was ~95 kg on 10/03/11.  Goal of Therapy:  Anti-Xa level 0.6-1.2 units/ml 4hrs after LMWH dose given Monitor platelets by anticoagulation protocol: Yes   Plan:    Decrease Lovenox to 80 mg SQ q12hrs.   Will check CBC and PT/INR in am.   Will follow-up for transition to IV heparin on 7/28, as previously planned.  OR planned for 7/29 afternoon.  Dennie Fetters, RPh Pager: [phone] 10/29/2011,11:45 AM

## 2011-10-30 DIAGNOSIS — I10 Essential (primary) hypertension: Secondary | ICD-10-CM

## 2011-10-30 LAB — PROTIME-INR
INR: 1.42 (ref 0.00–1.49)
Prothrombin Time: 17.6 seconds — ABNORMAL HIGH (ref 11.6–15.2)

## 2011-10-30 LAB — HEPARIN LEVEL (UNFRACTIONATED): Heparin Unfractionated: 0.61 IU/mL (ref 0.30–0.70)

## 2011-10-30 LAB — CBC
HCT: 33.7 % — ABNORMAL LOW (ref 39.0–52.0)
Hemoglobin: 10.7 g/dL — ABNORMAL LOW (ref 13.0–17.0)
MCHC: 31.8 g/dL (ref 30.0–36.0)
RBC: 4.02 MIL/uL — ABNORMAL LOW (ref 4.22–5.81)
WBC: 7.2 10*3/uL (ref 4.0–10.5)

## 2011-10-30 LAB — GLUCOSE, CAPILLARY: Glucose-Capillary: 195 mg/dL — ABNORMAL HIGH (ref 70–99)

## 2011-10-30 MED ORDER — WHITE PETROLATUM GEL
Status: AC
  Administered 2011-10-30: 21:00:00
  Filled 2011-10-30: qty 5

## 2011-10-30 MED ORDER — HEPARIN (PORCINE) IN NACL 100-0.45 UNIT/ML-% IJ SOLN
1200.0000 [IU]/h | INTRAMUSCULAR | Status: AC
  Administered 2011-10-30: 1400 [IU]/h via INTRAVENOUS
  Filled 2011-10-30 (×2): qty 250

## 2011-10-30 MED ORDER — MORPHINE SULFATE 2 MG/ML IJ SOLN
1.0000 mg | Freq: Once | INTRAMUSCULAR | Status: AC
  Administered 2011-10-30: 1 mg via INTRAVENOUS
  Filled 2011-10-30: qty 1

## 2011-10-30 NOTE — Progress Notes (Signed)
ANTICOAGULATION CONSULT NOTE - Follow Up Consult  Pharmacy Consult for Lovenox --> Heparin  Indication: subacute RUE DVT  No Known Allergies   Vital Signs: Temp: 98.5 F (36.9 C) (07/27 2130) Temp src: Oral (07/27 2130) BP: 154/78 mmHg (07/27 2130) Pulse Rate: 83  (07/27 2130)  Heparin dosing weight = 81 kg   Labs:  Basename 10/28/11 0525 10/27/11 1455 10/27/11 1342  HGB -- -- 10.4*  HCT -- -- 32.6*  PLT -- -- 238  APTT -- -- --  LABPROT 21.2* 22.0* --  INR 1.80* 1.89* --  HEPARINUNFRC -- -- --  CREATININE -- -- 0.87  CKTOTAL -- -- --  CKMB -- -- --  TROPONINI -- -- --    Estimated Creatinine Clearance: 76.4 ml/min (by C-G formula based on Cr of 0.87).   Medical History: Past Medical History  Diagnosis Date  . Diabetes mellitus   . Hypertension   . Hyperlipidemia   . Cervical spinal stenosis   . Lumbar spinal stenosis   . 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 DVT (deep venous thrombosis)     RUE 10/2011  . Anemia     Medications:  Prescriptions prior to admission  Medication Sig Dispense Refill  . acarbose (PRECOSE) 25 MG tablet Take 25 mg by mouth 3 (three) times daily with meals.      Marland Kitchen aspirin 81 MG chewable tablet Chew 81 mg by mouth daily.      . captopril (CAPOTEN) 25 MG tablet Take 25 mg by mouth 3 (three) times daily.      . cholecalciferol (VITAMIN D) 1000 UNITS tablet Take 2,000 Units by mouth daily.      . fluticasone (FLONASE) 50 MCG/ACT nasal spray Place 2 sprays into the nose daily.      Marland Kitchen glimepiride (AMARYL) 1 MG tablet Take 1 mg by mouth 2 (two) times daily.      Marland Kitchen HYDROcodone-acetaminophen (NORCO/VICODIN) 5-325 MG per tablet Take 1 tablet by mouth every 4 (four) hours as needed. For pain      . magnesium hydroxide (MILK OF MAGNESIA) 400 MG/5ML suspension Take 30 mLs by mouth daily as needed.      . metFORMIN (GLUCOPHAGE) 1000 MG tablet Take 1,000 mg by mouth 2 (two) times daily with a meal.      . methocarbamol  (ROBAXIN) 500 MG tablet Take 500 mg by mouth every 8 (eight) hours as needed. For muscle spasm      . senna (SENOKOT) 8.6 MG TABS Take 1 tablet (8.6 mg total) by mouth 2 (two) times daily.  120 each    . simvastatin (ZOCOR) 20 MG tablet Take 20 mg by mouth every evening.      . warfarin (COUMADIN) 6 MG tablet Take 6 mg by mouth daily.      Marland Kitchen zolpidem (AMBIEN) 5 MG tablet Take 5 mg by mouth at bedtime as needed. For sleep      . benzoyl peroxide 5 % external liquid Apply topically 2 (two) times daily.  142 g  12    Assessment: 71 y/o male patient admitted with likely spinal cord compression. H/o subacute RUE DVT, diagnosed earlier this month. Coumadin has been on hold since admit for possible surgery, and patient has been receiving Lovenox. Pharmacy consulted to manage conversion of Lovenox to IV heparin. Plan for possible spinal surgery on Monday 7/29. Patient is currently receiving Lovenox 80mg  SQ Q12H with last dose at 20:00 7/27.   Goal  of Therapy:  Heparin level 0.3-0.7 units/mL Monitor platelets by anticoagulation protocol: Yes   Plan:  1. At 05:00 start IV heparin at 1400 units/hr  2. Heparin level at 11:00 3. Daily CBC, heparin level.  Lorre Munroe, PharmD 10/30/2011,12:06 AM

## 2011-10-30 NOTE — Progress Notes (Cosign Needed)
[facility] progress note. Chief complaint. Left arm radiating pain. History of present illness. This 71 year old male in hospital with severe cervical spinal stenosis. Other history includes anemia, diabetes, hypertension, DVT right upper extremity 10/22/11. Patient complained to nursing of left shoulder pain radiating down the left arm. Nursing was concerned this might be cardiac in origin and obtained a 12-lead EKG. I did see the patient at the bedside and reviewed the EKG. I see no indication of ischemia per the current EKG. Patient in fact tells me that this is not really a new pain but an aggravation of existing pain he has been having in his neck and shoulders. He does not feel this represents any type of cardiac angina. Vital signs. Temperature 98.1, pulse 72, respirations 17, blood pressure 148/79. O2 sats 96%. General appearance. Frail appearing elderly male in no distress. Alert, oriented, cooperative. Cardiac. Rate and rhythm regular. Lungs. Breath sounds clear and equal. Abdomen. Soft with positive bowel sounds. No pain. Impression/plan. Problem #1 left shoulder to arm radiating pain. Patient's EKG does not appear suggestive of ischemia. I suspect that the current pain complaints related to the cervical spinal stenosis. Nonetheless I will obtain cardiac enzymes now and then every 8 hours for a total of 3 sets to evaluate for any possible cardiac anemia.

## 2011-10-30 NOTE — Progress Notes (Signed)
ANTICOAGULATION CONSULT NOTE - Follow Up Consult  Pharmacy Consult for Heparin Indication: hx subacute DVT  No Known Allergies  Patient Measurements: Height: 5\' 8"  (172.7 cm) Weight: 179 lb 3.7 oz (81.3 kg) IBW/kg (Calculated) : 68.4  Heparin Dosing Weight: 81kg  Vital Signs: Temp: 97.6 F (36.4 C) (07/28 1500) Temp src: Oral (07/28 1500) BP: 130/69 mmHg (07/28 1500) Pulse Rate: 77  (07/28 1500)  Labs:  Basename 10/30/11 1839 10/30/11 1032 10/30/11 0538 10/28/11 0525  HGB 10.7* -- -- --  HCT 33.7* -- -- --  PLT 215 -- -- --  APTT -- -- -- --  LABPROT -- -- 17.6* 21.2*  INR -- -- 1.42 1.80*  HEPARINUNFRC 0.61 0.80* -- --  CREATININE -- -- -- --  CKTOTAL -- -- -- --  CKMB -- -- -- --  TROPONINI -- -- -- --    Estimated Creatinine Clearance: 76.4 ml/min (by C-G formula based on Cr of 0.87).   Medications:  Heparin 1200 units/hr  Assessment: 70yom on heparin for hx subacute DVT while Coumadin on hold for surgery. Heparin level (0.61) is therapeutic after rate decrease. MD has ordered heparin to stop tonight ~2230 - RN aware. - H/H and Plts stable - No significant bleeding reported  Goal of Therapy:  Heparin level 0.3-0.7 units/ml Monitor platelets by anticoagulation protocol: Yes   Plan:  1. Continue heparin drip 1200 units/hr (12 ml/hr) - discontinue per MD ~2230 2. Discontinue scheduled heparin levels 3. Follow-up post-op orders and restart of anticoagulation  Cleon Dew [phone] 10/30/2011,7:34 PM

## 2011-10-30 NOTE — Progress Notes (Signed)
MD, Please clarify diet orders for today.  Thanks.

## 2011-10-30 NOTE — Progress Notes (Signed)
[facility] PROGRESS NOTE  [name] UJW:[id number] DOB: 07-16-1940 DOA: 10/27/2011 PCP: [name], MD  Assessment/Plan: 1. C4-5 and C5-6 severe spinal stenosis, spondylosis, cervical myelopathy/radiculopathy, cervicalgia, quadriparesis: Surgery planned 7/29 per neurosurgery.  2. Normocytic anemia: Stable. Etiology unclear. Followup as an outpatient. 3. Diabetes mellitus: Stable. Continue sliding scale insulin. Hold metformin while in inpatient. Resume oral antihypoglycemic after surgery. 4. HTN: Stable. Continue captopril. 5. DVT right upper extremity: Diagnosis 10/2011. Warfarin on hold pending surgery. Heparin today--will discontinue prior to surgery. 6. Constipation: Bowel regimen.  Episode of upper back pain and left arm pain last night--non-cardiac. Patient has chronic pain and had an exacerbation last night. No further evaluation indicated.  Code Status: Full code  Family Communication: Discussed at bedside with wife  Disposition Plan: Pending further evaluation   Brendia Sacks, MD  [facility] Pager [phone]. If 7PM-7AM, please contact night-coverage at [email or web address], password [facility] 10/30/2011, 3:08 PM  LOS: 3 days   Brief narrative: 71 year old presented to the emergency department with complaint of upper back pain. Additional complaints include progressive weakness of bilateral lower extremities, urinary incontinence, increasing left upper extremity weakness.   Consultants:  Neurosurgery  Procedures:    HPI/Subjective: Overnight events noted. Complained of left arm pain, upper back pain last night. Patient denies chest pain now or last night.   Objective: Filed Vitals:   10/28/11 2129 10/29/11 0620 10/29/11 2130 10/30/11 0409  BP: 142/72 149/76 154/78 148/79  Pulse: 83 73 83 72  Temp: 98.7 F (37.1 C) 98 F (36.7 C) 98.5 F (36.9 C) 98.1 F (36.7 C)  TempSrc: Oral Oral Oral Oral  Resp: 18 20 18 17   Height:      Weight:      SpO2:  93% 94% 100% 96%    Intake/Output Summary (Last 24 hours) at 10/30/11 1508 Last data filed at 10/30/11 0622  Gross per 24 hour  Intake  65784 ml  Output   2525 ml  Net  21910 ml    Exam:   General:  Appears calm and comfortable. Speech fluent and clear.  Cardiovascular: Regular rate and rhythm. No rub or gallop. 1+ bilateral lower extremity edema.  Respiratory: Clear to auscultation bilaterally. No wheezes, rales, rhonchi. Normal respiratory effort.  Exam remains current 7/28.  Neurologic exam appears unchanged.  Data Reviewed: Basic Metabolic Panel:  Lab 10/27/11 6962  NA 137  K 4.2  CL 101  CO2 24  GLUCOSE 102*  BUN 22  CREATININE 0.87  CALCIUM 9.7  MG --  PHOS --   CBC:  Lab 10/27/11 1342  WBC 6.3  NEUTROABS 4.2  HGB 10.4*  HCT 32.6*  MCV 84.0  PLT 238   CBG:  Lab 10/30/11 1203 10/30/11 0750 10/29/11 2126 10/29/11 1633 10/29/11 1204  GLUCAP 142* 112* 143* 114* 162*   Recent Results (from the past 240 hour(s))  MRSA PCR SCREENING     Status: Normal   Collection Time   10/27/11  7:09 PM      Component Value Range Status Comment   MRSA by PCR NEGATIVE  NEGATIVE Final     Studies:  Scheduled Meds:    . captopril  25 mg Oral TID  . docusate sodium  100 mg Oral BID  . enoxaparin (LOVENOX) injection  80 mg Subcutaneous Q12H  . fluticasone  2 spray Each Nare Daily  . insulin aspart  0-9 Units Subcutaneous TID WC  .  morphine injection  1 mg Intravenous Once  . polyethylene glycol  17 g Oral Daily  . senna  2 tablet Oral QHS  . simvastatin  20 mg Oral q1800  . sodium chloride  3 mL Intravenous Q12H   Continuous Infusions:    . heparin 1,200 Units/hr (10/30/11 1239)    Principal Problem:  *Quadriparesis Active Problems:  Spondylosis of cervical joint  Cervical myelopathy  DVT of right proximal brachial through the distal axillary vein, acute  Cervical stenosis of spine  Diabetes mellitus, type 2  Complex regional pain syndrome of right  upper extremity secondary to severe cervical spinal stenosis  Constipation due to pain medication     Brendia Sacks, MD  [facility] Pager [phone]. If 7PM-7AM, please contact night-coverage at [email or web address], password [facility]: Lambert Rhonda W 10/30/2011, 3:08 PM  LOS: 3 days   Time spent: 15 minutes

## 2011-10-30 NOTE — Progress Notes (Signed)
PAtient still complaining of pain despite 2mg  dose of morphine at 04:34 except now he states that the pain has spread to both shoulders instead of the pain just radiating in his left arm.  Claiborne Billings, NP notified and a one time dose of 1mg  morphine was ordered.  Will continue to monitor patient.

## 2011-10-30 NOTE — Progress Notes (Signed)
ANTICOAGULATION CONSULT NOTE - Follow Up Consult  Pharmacy Consult for Heparin Indication: hx subacute RUE DVT (off Coumadin for procedure)  No Known Allergies  Patient Measurements: Height: 5\' 8"  (172.7 cm) Weight: 179 lb 3.7 oz (81.3 kg) IBW/kg (Calculated) : 68.4  Heparin Dosing Weight: 81 kg  Vital Signs: Temp: 98.1 F (36.7 C) (07/28 0409) Temp src: Oral (07/28 0409) BP: 148/79 mmHg (07/28 0409) Pulse Rate: 72  (07/28 0409)  Labs:  Basename 10/30/11 1032 10/30/11 0538 10/28/11 0525 10/27/11 1455 10/27/11 1342  HGB -- -- -- -- 10.4*  HCT -- -- -- -- 32.6*  PLT -- -- -- -- 238  APTT -- -- -- -- --  LABPROT -- 17.6* 21.2* 22.0* --  INR -- 1.42 1.80* 1.89* --  HEPARINUNFRC 0.80* -- -- -- --  CREATININE -- -- -- -- 0.87  CKTOTAL -- -- -- -- --  CKMB -- -- -- -- --  TROPONINI -- -- -- -- --    Estimated Creatinine Clearance: 76.4 ml/min (by C-G formula based on Cr of 0.87).  Assessment:   Initial heparin level is above goal on 1400 units/hr.   Off Coumadin for cervical disc surgery 7/29  - posted for 12:15pm.   INR down to 1.42.  Last CBC 7/25.  Goal of Therapy:  Heparin level 0.3-0.7 units/ml Monitor platelets by anticoagulation protocol: Yes   Plan:    Decrease heparin drip to 1200 units/hr.   Heparin level and CBC ~ 6 hrs after rate decrease.   Daily heparin level and CBC while on Heparin.    Neurosurgery to indicate timing of discontinuation of heparin prior to surgery, as well as post-op restart.  Dennie Fetters, RPh Pager: [phone] 10/30/2011,12:10 PM

## 2011-10-31 ENCOUNTER — Encounter (HOSPITAL_COMMUNITY): Payer: Self-pay | Admitting: Anesthesiology

## 2011-10-31 ENCOUNTER — Inpatient Hospital Stay (HOSPITAL_COMMUNITY): Payer: Medicare Other

## 2011-10-31 ENCOUNTER — Encounter (HOSPITAL_COMMUNITY)
Admission: EM | Disposition: A | Discharge status: Skilled Nursing Facility | Payer: Self-pay | Source: Home / Self Care | Attending: Neurosurgery

## 2011-10-31 ENCOUNTER — Inpatient Hospital Stay (HOSPITAL_COMMUNITY): Payer: Medicare Other | Admitting: Anesthesiology

## 2011-10-31 HISTORY — PX: ANTERIOR CERVICAL DECOMP/DISCECTOMY FUSION: SHX1161

## 2011-10-31 LAB — CBC
HCT: 32.7 % — ABNORMAL LOW (ref 39.0–52.0)
Hemoglobin: 10.4 g/dL — ABNORMAL LOW (ref 13.0–17.0)
MCV: 82.8 fL (ref 78.0–100.0)
RBC: 3.95 MIL/uL — ABNORMAL LOW (ref 4.22–5.81)
RDW: 22.7 % — ABNORMAL HIGH (ref 11.5–15.5)
WBC: 6.2 10*3/uL (ref 4.0–10.5)

## 2011-10-31 LAB — GLUCOSE, CAPILLARY
Glucose-Capillary: 129 mg/dL — ABNORMAL HIGH (ref 70–99)
Glucose-Capillary: 229 mg/dL — ABNORMAL HIGH (ref 70–99)

## 2011-10-31 LAB — SURGICAL PCR SCREEN: MRSA, PCR: NEGATIVE

## 2011-10-31 LAB — HEPARIN LEVEL (UNFRACTIONATED): Heparin Unfractionated: 0.1 IU/mL — ABNORMAL LOW (ref 0.30–0.70)

## 2011-10-31 SURGERY — ANTERIOR CERVICAL DECOMPRESSION/DISCECTOMY FUSION 2 LEVELS
Anesthesia: General | Wound class: Clean

## 2011-10-31 MED ORDER — CEFAZOLIN SODIUM 1-5 GM-% IV SOLN
INTRAVENOUS | Status: DC | PRN
  Administered 2011-10-31: 2 g via INTRAVENOUS

## 2011-10-31 MED ORDER — LIDOCAINE HCL (CARDIAC) 20 MG/ML IV SOLN
INTRAVENOUS | Status: DC | PRN
  Administered 2011-10-31: 70 mg via INTRAVENOUS

## 2011-10-31 MED ORDER — 0.9 % SODIUM CHLORIDE (POUR BTL) OPTIME
TOPICAL | Status: DC | PRN
  Administered 2011-10-31: 1000 mL

## 2011-10-31 MED ORDER — PROPOFOL 10 MG/ML IV EMUL
INTRAVENOUS | Status: DC | PRN
  Administered 2011-10-31: 130 mg via INTRAVENOUS

## 2011-10-31 MED ORDER — ACETAMINOPHEN 650 MG RE SUPP: 650.0000 mg | RECTAL | Status: DC | PRN

## 2011-10-31 MED ORDER — THROMBIN 5000 UNITS EX SOLR
CUTANEOUS | Status: DC | PRN
  Administered 2011-10-31 (×2): 5000 [IU] via TOPICAL

## 2011-10-31 MED ORDER — ACETAMINOPHEN 325 MG PO TABS: 650.0000 mg | ORAL_TABLET | ORAL | Status: DC | PRN

## 2011-10-31 MED ORDER — OXYCODONE-ACETAMINOPHEN 5-325 MG PO TABS
1.0000 | ORAL_TABLET | ORAL | Status: DC | PRN
  Administered 2011-11-01: 2 via ORAL
  Administered 2011-11-01 – 2011-11-03 (×6): 1 via ORAL
  Administered 2011-11-04 – 2011-11-08 (×10): 2 via ORAL
  Filled 2011-10-31 (×3): qty 2
  Filled 2011-10-31 (×3): qty 1
  Filled 2011-10-31 (×5): qty 2
  Filled 2011-10-31 (×2): qty 1
  Filled 2011-10-31 (×2): qty 2
  Filled 2011-10-31: qty 1
  Filled 2011-10-31: qty 2

## 2011-10-31 MED ORDER — BACITRACIN ZINC 500 UNIT/GM EX OINT
TOPICAL_OINTMENT | CUTANEOUS | Status: DC | PRN
  Administered 2011-10-31: 1 via TOPICAL

## 2011-10-31 MED ORDER — MORPHINE SULFATE 2 MG/ML IJ SOLN
1.0000 mg | Freq: Once | INTRAMUSCULAR | Status: AC
  Administered 2011-10-31: 1 mg via INTRAVENOUS

## 2011-10-31 MED ORDER — HYDROMORPHONE HCL PF 1 MG/ML IJ SOLN
0.2500 mg | INTRAMUSCULAR | Status: DC | PRN
  Administered 2011-11-01 – 2011-11-02 (×2): 0.5 mg via INTRAVENOUS
  Filled 2011-10-31 (×2): qty 1

## 2011-10-31 MED ORDER — LACTATED RINGERS IV SOLN
INTRAVENOUS | Status: DC | PRN
  Administered 2011-10-31 (×2): via INTRAVENOUS

## 2011-10-31 MED ORDER — LACTATED RINGERS IV SOLN
INTRAVENOUS | Status: DC
  Administered 2011-11-01 – 2011-11-03 (×4): via INTRAVENOUS
  Administered 2011-11-03: 75 mL/h via INTRAVENOUS
  Administered 2011-11-05: 999 mL via INTRAVENOUS
  Administered 2011-11-06: 1000 mL via INTRAVENOUS

## 2011-10-31 MED ORDER — MIDAZOLAM HCL 5 MG/5ML IJ SOLN
INTRAMUSCULAR | Status: DC | PRN
  Administered 2011-10-31: 1 mg via INTRAVENOUS

## 2011-10-31 MED ORDER — HYDROCODONE-ACETAMINOPHEN 5-325 MG PO TABS
1.0000 | ORAL_TABLET | ORAL | Status: DC | PRN
  Administered 2011-11-01 – 2011-11-03 (×3): 1 via ORAL
  Administered 2011-11-03: 2 via ORAL
  Administered 2011-11-03 (×3): 1 via ORAL
  Administered 2011-11-04 – 2011-11-05 (×5): 2 via ORAL
  Administered 2011-11-05: 1 via ORAL
  Filled 2011-10-31 (×2): qty 2
  Filled 2011-10-31 (×4): qty 1
  Filled 2011-10-31 (×4): qty 2
  Filled 2011-10-31: qty 1
  Filled 2011-10-31: qty 2
  Filled 2011-10-31: qty 1

## 2011-10-31 MED ORDER — ONDANSETRON HCL 4 MG/2ML IJ SOLN: 4.0000 mg | INTRAMUSCULAR | Status: DC | PRN

## 2011-10-31 MED ORDER — DIAZEPAM 2 MG PO TABS
2.0000 mg | ORAL_TABLET | Freq: Four times a day (QID) | ORAL | Status: DC | PRN
Start: 2011-10-31 — End: 2011-11-08
  Administered 2011-11-01 – 2011-11-08 (×7): 2 mg via ORAL
  Filled 2011-10-31 (×8): qty 1

## 2011-10-31 MED ORDER — DEXAMETHASONE SODIUM PHOSPHATE 4 MG/ML IJ SOLN
4.0000 mg | Freq: Four times a day (QID) | INTRAMUSCULAR | Status: AC
  Administered 2011-11-01 (×2): 4 mg via INTRAVENOUS
  Filled 2011-10-31 (×2): qty 1

## 2011-10-31 MED ORDER — ONDANSETRON HCL 4 MG/2ML IJ SOLN: 4.0000 mg | Freq: Once | INTRAMUSCULAR | Status: AC | PRN

## 2011-10-31 MED ORDER — SODIUM CHLORIDE 0.9 % IR SOLN
Status: DC | PRN
  Administered 2011-10-31: 16:00:00

## 2011-10-31 MED ORDER — DEXAMETHASONE SODIUM PHOSPHATE 4 MG/ML IJ SOLN
INTRAMUSCULAR | Status: DC | PRN
  Administered 2011-10-31: 10 mg via INTRAVENOUS

## 2011-10-31 MED ORDER — MORPHINE SULFATE 2 MG/ML IJ SOLN
1.0000 mg | INTRAMUSCULAR | Status: DC | PRN
  Administered 2011-11-01 – 2011-11-05 (×5): 2 mg via INTRAVENOUS
  Administered 2011-11-05: 4 mg via INTRAVENOUS
  Administered 2011-11-06: 2 mg via INTRAVENOUS
  Filled 2011-10-31: qty 1
  Filled 2011-10-31: qty 2
  Filled 2011-10-31 (×6): qty 1

## 2011-10-31 MED ORDER — SUCCINYLCHOLINE CHLORIDE 20 MG/ML IJ SOLN
INTRAMUSCULAR | Status: DC | PRN
  Administered 2011-10-31: 100 mg via INTRAVENOUS

## 2011-10-31 MED ORDER — ONDANSETRON HCL 4 MG/2ML IJ SOLN
INTRAMUSCULAR | Status: DC | PRN
  Administered 2011-10-31: 4 mg via INTRAVENOUS

## 2011-10-31 MED ORDER — HEMOSTATIC AGENTS (NO CHARGE) OPTIME
TOPICAL | Status: DC | PRN
  Administered 2011-10-31: 1 via TOPICAL

## 2011-10-31 MED ORDER — FENTANYL CITRATE 0.05 MG/ML IJ SOLN
INTRAMUSCULAR | Status: DC | PRN
  Administered 2011-10-31 (×2): 50 ug via INTRAVENOUS
  Administered 2011-10-31: 100 ug via INTRAVENOUS
  Administered 2011-10-31: 50 ug via INTRAVENOUS

## 2011-10-31 MED ORDER — PHENOL 1.4 % MT LIQD: 1.0000 | OROMUCOSAL | Status: DC | PRN

## 2011-10-31 MED ORDER — CEFAZOLIN SODIUM-DEXTROSE 2-3 GM-% IV SOLR
2.0000 g | Freq: Three times a day (TID) | INTRAVENOUS | Status: AC
  Administered 2011-10-31 – 2011-11-01 (×2): 2 g via INTRAVENOUS
  Filled 2011-10-31 (×2): qty 50

## 2011-10-31 MED ORDER — DEXAMETHASONE 4 MG PO TABS: 4.0000 mg | ORAL_TABLET | Freq: Four times a day (QID) | ORAL | Status: AC

## 2011-10-31 MED ORDER — MENTHOL 3 MG MT LOZG
1.0000 | LOZENGE | OROMUCOSAL | Status: DC | PRN
  Administered 2011-11-03: 3 mg via ORAL
  Filled 2011-10-31: qty 9

## 2011-10-31 MED ORDER — PHENYLEPHRINE HCL 10 MG/ML IJ SOLN
INTRAMUSCULAR | Status: DC | PRN
  Administered 2011-10-31 (×4): 80 ug via INTRAVENOUS

## 2011-10-31 SURGICAL SUPPLY — 62 items
APL SKNCLS STERI-STRIP NONHPOA (GAUZE/BANDAGES/DRESSINGS) ×1
BAG DECANTER FOR FLEXI CONT (MISCELLANEOUS) ×2 IMPLANT
BENZOIN TINCTURE PRP APPL 2/3 (GAUZE/BANDAGES/DRESSINGS) ×3 IMPLANT
BIT DRILL SPINE QC 14 (BIT) ×1 IMPLANT
BLADE SURG 15 STRL LF DISP TIS (BLADE) ×1 IMPLANT
BLADE SURG 15 STRL SS (BLADE) ×2
BLADE ULTRA TIP 2M (BLADE) ×2 IMPLANT
BRUSH SCRUB EZ PLAIN DRY (MISCELLANEOUS) ×2 IMPLANT
BUR BARREL STRAIGHT FLUTE 4.0 (BURR) ×2 IMPLANT
BUR MATCHSTICK NEURO 3.0 LAGG (BURR) ×2 IMPLANT
CANISTER SUCTION 2500CC (MISCELLANEOUS) ×2 IMPLANT
CLOTH BEACON ORANGE TIMEOUT ST (SAFETY) ×2 IMPLANT
CONT SPEC 4OZ CLIKSEAL STRL BL (MISCELLANEOUS) ×2 IMPLANT
COVER MAYO STAND STRL (DRAPES) ×2 IMPLANT
DRAPE LAPAROTOMY 100X72 PEDS (DRAPES) ×2 IMPLANT
DRAPE MICROSCOPE LEICA (MISCELLANEOUS) ×2 IMPLANT
DRAPE POUCH INSTRU U-SHP 10X18 (DRAPES) ×2 IMPLANT
DRAPE SURG 17X23 STRL (DRAPES) ×4 IMPLANT
ELECT REM PT RETURN 9FT ADLT (ELECTROSURGICAL) ×2
ELECTRODE REM PT RTRN 9FT ADLT (ELECTROSURGICAL) ×1 IMPLANT
GAUZE SPONGE 4X4 16PLY XRAY LF (GAUZE/BANDAGES/DRESSINGS) IMPLANT
GLOVE BIO SURGEON STRL SZ 6.5 (GLOVE) ×2 IMPLANT
GLOVE BIO SURGEON STRL SZ8.5 (GLOVE) ×2 IMPLANT
GLOVE BIOGEL PI IND STRL 6.5 (GLOVE) IMPLANT
GLOVE BIOGEL PI INDICATOR 6.5 (GLOVE) ×1
GLOVE ECLIPSE 6.5 STRL STRAW (GLOVE) ×1 IMPLANT
GLOVE EXAM NITRILE LRG STRL (GLOVE) IMPLANT
GLOVE EXAM NITRILE MD LF STRL (GLOVE) ×1 IMPLANT
GLOVE EXAM NITRILE XL STR (GLOVE) IMPLANT
GLOVE EXAM NITRILE XS STR PU (GLOVE) IMPLANT
GLOVE SS BIOGEL STRL SZ 8 (GLOVE) ×1 IMPLANT
GLOVE SUPERSENSE BIOGEL SZ 8 (GLOVE) ×1
GOWN BRE IMP SLV AUR LG STRL (GOWN DISPOSABLE) ×2 IMPLANT
GOWN BRE IMP SLV AUR XL STRL (GOWN DISPOSABLE) ×2 IMPLANT
KIT BASIN OR (CUSTOM PROCEDURE TRAY) ×2 IMPLANT
KIT ROOM TURNOVER OR (KITS) ×2 IMPLANT
MARKER SKIN DUAL TIP RULER LAB (MISCELLANEOUS) ×2 IMPLANT
NDL SPNL 18GX3.5 QUINCKE PK (NEEDLE) ×1 IMPLANT
NEEDLE HYPO 22GX1.5 SAFETY (NEEDLE) ×2 IMPLANT
NEEDLE SPNL 18GX3.5 QUINCKE PK (NEEDLE) ×2 IMPLANT
NS IRRIG 1000ML POUR BTL (IV SOLUTION) ×2 IMPLANT
PACK LAMINECTOMY NEURO (CUSTOM PROCEDURE TRAY) ×2 IMPLANT
PATTIES SURGICAL .5 X.5 (GAUZE/BANDAGES/DRESSINGS) ×2 IMPLANT
PEEK VISTA 14X14X7MM (Peek) ×1 IMPLANT
PEEK VISTA 14X14X8MM (Peek) ×1 IMPLANT
PIN DISTRACTION 14MM (PIN) ×4 IMPLANT
PLATE ANT CERV XTEND 2 LV 32 (Plate) ×1 IMPLANT
PUTTY 5ML ACTIFUSE ABX (Putty) ×1 IMPLANT
RUBBERBAND STERILE (MISCELLANEOUS) ×4 IMPLANT
SCREW XTD VAR 4.2 SELF TAP (Screw) ×6 IMPLANT
SPONGE GAUZE 4X4 12PLY (GAUZE/BANDAGES/DRESSINGS) ×2 IMPLANT
SPONGE INTESTINAL PEANUT (DISPOSABLE) ×4 IMPLANT
SPONGE SURGIFOAM ABS GEL SZ50 (HEMOSTASIS) ×2 IMPLANT
STRIP CLOSURE SKIN 1/2X4 (GAUZE/BANDAGES/DRESSINGS) ×2 IMPLANT
SUT VIC AB 0 CT1 27 (SUTURE) ×2
SUT VIC AB 0 CT1 27XBRD ANTBC (SUTURE) ×1 IMPLANT
SUT VIC AB 3-0 SH 8-18 (SUTURE) ×2 IMPLANT
SYR 20ML ECCENTRIC (SYRINGE) ×2 IMPLANT
TAPE CLOTH SURG 4X10 WHT LF (GAUZE/BANDAGES/DRESSINGS) ×2 IMPLANT
TOWEL OR 17X24 6PK STRL BLUE (TOWEL DISPOSABLE) ×2 IMPLANT
TOWEL OR 17X26 10 PK STRL BLUE (TOWEL DISPOSABLE) ×2 IMPLANT
WATER STERILE IRR 1000ML POUR (IV SOLUTION) ×2 IMPLANT

## 2011-10-31 NOTE — Transfer of Care (Signed)
Immediate Anesthesia Transfer of Care Note  Patient: [name]  Procedure(s) Performed: Procedure(s) (LRB): ANTERIOR CERVICAL DECOMPRESSION/DISCECTOMY FUSION 2 LEVELS (N/A)  Patient Location: PACU  Anesthesia Type: General  Level of Consciousness: awake, pateint uncooperative and confused  Airway & Oxygen Therapy: Patient Spontanous Breathing and Patient connected to face mask oxygen  Post-op Assessment: Report given to PACU RN, Post -op Vital signs reviewed and stable and Patient moving all extremities  Post vital signs: Reviewed and stable  Complications: No apparent anesthesia complications

## 2011-10-31 NOTE — Progress Notes (Signed)
Patient ID: [name], male   DOB: 03-28-1941, 71 y.o.   MRN: [id number] Subjective:  The patient is alert and pleasant. I have answered all his questions regarding surgery. He wants to proceed with the operation.  Objective: Vital signs in last 24 hours: Temp:  [97.9 F (36.6 C)-98.5 F (36.9 C)] 97.9 F (36.6 C) (07/29 0428) Pulse Rate:  [75-86] 75  (07/29 0428) Resp:  [18] 18  (07/29 0428) BP: (144-149)/(67-76) 149/76 mmHg (07/29 0428) SpO2:  [100 %] 100 % (07/29 0428)  Intake/Output from previous day: 07/28 0701 - 07/29 0700 In: 840 [P.O.:840] Out: 1100 [Urine:1100] Intake/Output this shift: Total I/O In: -  Out: 1600 [Urine:1600]  Physical exam the patient is quadriparetic with his motor exam unchanged from his consultation of 10/27/2011.  Lab Results:  Basename 10/31/11 0521 10/30/11 1839  WBC 6.2 7.2  HGB 10.4* 10.7*  HCT 32.7* 33.7*  PLT 237 215   BMET No results found for this basename: NA:2,K:2,CL:2,CO2:2,GLUCOSE:2,BUN:2,CREATININE:2,CALCIUM:2 in the last 72 hours  Studies/Results: No results found.  Assessment/Plan: C4-5 and C5-6 severe spinal stenosis, cervical myelopathy, Quadraparesis, cervicalgia: I discussed situation with the patient. We have discussed the various treatment options including surgery. I have described the surgical option of a C4-5 and C5-6 anterior cervicectomy, MI arthrodesis and prosthesis, and intracervical plating. We have discussed the risks, benefits, alternatives and likelihood of achieving our goals with surgery. I've clearly explained to patient that I cannot fix his spinal cord. Hopefully decompressing spinal cord were allowed him to improved somewhat. I've clearly told him that he has some permanent injury. I've answered all the patient's questions he has decided proceed with surgery.  LOS: 4 days     Monicia Tse D 10/31/2011, 3:03 PM

## 2011-10-31 NOTE — Anesthesia Procedure Notes (Signed)
Procedure Name: Intubation Performed by: Aarav Burgett L Pre-anesthesia Checklist: Patient identified, Emergency Drugs available, Suction available, Patient being monitored and Timeout performed Patient Re-evaluated:Patient Re-evaluated prior to inductionOxygen Delivery Method: Circle system utilized Preoxygenation: Pre-oxygenation with 100% oxygen Intubation Type: IV induction Ventilation: Mask ventilation without difficulty Tube type: Oral Number of attempts: 1 Airway Equipment and Method: Stylet and Video-laryngoscopy Placement Confirmation: ETT inserted through vocal cords under direct vision,  positive ETCO2 and breath sounds checked- equal and bilateral Secured at: 22 cm Tube secured with: Tape Dental Injury: Teeth and Oropharynx as per pre-operative assessment  Difficulty Due To: Difficult Airway-  due to neck instability

## 2011-10-31 NOTE — Progress Notes (Signed)
Subjective:  The patient is alert and pleasant. At times a bit agitated.  Objective: Vital signs in last 24 hours: Temp:  [97.2 F (36.2 C)-98.5 F (36.9 C)] 97.2 F (36.2 C) (07/29 1836) Pulse Rate:  [75-86] 75  (07/29 0428) Resp:  [18] 18  (07/29 0428) BP: (144-149)/(67-76) 149/76 mmHg (07/29 0428) SpO2:  [100 %] 100 % (07/29 0428)  Intake/Output from previous day: 07/28 0701 - 07/29 0700 In: 840 [P.O.:840] Out: 1100 [Urine:1100] Intake/Output this shift: Total I/O In: 1900 [I.V.:1900] Out: 2150 [Urine:2050; Blood:100]  Physical exam the patient is alert and pleasant. Mildly confused. His motor strength has not changed since surgery. His dressing is clean and dry. There is no evidence of hematoma or shift.  Lab Results:  Basename 10/31/11 0521 10/30/11 1839  WBC 6.2 7.2  HGB 10.4* 10.7*  HCT 32.7* 33.7*  PLT 237 215   BMET No results found for this basename: NA:2,K:2,CL:2,CO2:2,GLUCOSE:2,BUN:2,CREATININE:2,CALCIUM:2 in the last 72 hours  Studies/Results: Dg Cervical Spine 2-3 Views  10/31/2011  z*RADIOLOGY REPORT*  Clinical Data: ACDF at C4-5, C5-6.  CERVICAL SPINE - 2-3 VIEW  Comparison: CT of the brain 10/27/2011  Findings: The patient has undergone anterior plate fusion of C4-C6. Detail below C6 is limited.  A second image shows intraoperative localization at C4-5. Large anterior osteophytes identified at C3- 4.  IMPRESSION:  1.  Intraoperative localization. 2.  Anterior plate fusion of C4-C6.  Original Report Authenticated By: Patterson Hammersmith, M.D.    Assessment/Plan: The patient is doing well.  LOS: 4 days     Merrilee Ancona D 10/31/2011, 6:50 PM

## 2011-10-31 NOTE — Anesthesia Postprocedure Evaluation (Signed)
  Anesthesia Post-op Note  Patient: [name]  Procedure(s) Performed: Procedure(s) (LRB): ANTERIOR CERVICAL DECOMPRESSION/DISCECTOMY FUSION 2 LEVELS (N/A)  Patient Location: PACU  Anesthesia Type: General  Level of Consciousness: awake, alert  and confused  Airway and Oxygen Therapy: Patient Spontanous Breathing and Patient connected to nasal cannula oxygen  Post-op Pain: none  Post-op Assessment: Post-op Vital signs reviewed, Patient's Cardiovascular Status Stable, Respiratory Function Stable, Patent Airway, No signs of Nausea or vomiting and Pain level controlled  Post-op Vital Signs: Reviewed and stable  Complications: No apparent anesthesia complications

## 2011-10-31 NOTE — Progress Notes (Signed)
Spoke to Dr. Franky Macho (on call for Dr. Lovell Sheehan).  Do not resume heparin tonight, will clarify with Dr. Lovell Sheehan in AM what time to resume.  Thanks! Reece Leader, Pharm D 10/31/2011 7:53 PM

## 2011-10-31 NOTE — Progress Notes (Signed)
[facility] PROGRESS NOTE  [name]:[id number] DOB: February 04, 1941 DOA: 10/27/2011 PCP: [name], MD  Assessment/Plan: 1. C4-5 and C5-6 severe spinal stenosis, spondylosis, cervical myelopathy/radiculopathy, cervicalgia, quadriparesis: Surgery planned today per neurosurgery.  2. Normocytic anemia: Stable. Etiology unclear. Followup as an outpatient. 3. Diabetes mellitus: Stable. Continue sliding scale insulin. Hold metformin while in inpatient. Resume oral antihypoglycemic after surgery. 4. HTN: Stable. Continue captopril. 5. DVT right upper extremity: Diagnosed 10/2011. Warfarin on hold pending surgery. Resume heparin after surgery when cleared by neurosurgery. 6. Constipation: Bowel regimen.  Code Status: Full code  Family Communication: Discussed at bedside with wife  Disposition Plan: Pending further evaluation   Brendia Sacks, MD  [facility] Pager [phone]. If 7PM-7AM, please contact night-coverage at [email or web address], password [facility] 10/31/2011, 9:30 AM  LOS: 4 days   Brief narrative: 71 year old presented to the emergency department with complaint of upper back pain. Additional complaints include progressive weakness of bilateral lower extremities, urinary incontinence, increasing left upper extremity weakness.   Consultants:  Neurosurgery  Procedures:    HPI/Subjective: No complaints. Complains of neck pain. No chest pain. No other issues.  Objective: Filed Vitals:   10/30/11 0409 10/30/11 1500 10/30/11 2056 10/31/11 0428  BP: 148/79 130/69 144/67 149/76  Pulse: 72 77 86 75  Temp: 98.1 F (36.7 C) 97.6 F (36.4 C) 98.5 F (36.9 C) 97.9 F (36.6 C)  TempSrc: Oral Oral Oral Oral  Resp: 17 18 18 18   Height:      Weight:      SpO2: 96% 95% 100% 100%    Intake/Output Summary (Last 24 hours) at 10/31/11 0930 Last data filed at 10/31/11 0429  Gross per 24 hour  Intake    480 ml  Output   1100 ml  Net   -620 ml    Exam:   General:   Appears calm and comfortable. Speech fluent and clear.  Cardiovascular: Regular rate and rhythm. No rub or gallop. 1+ bilateral lower extremity edema.  Respiratory: Clear to auscultation bilaterally. No wheezes, rales, rhonchi. Normal respiratory effort.  Musculoskeletal: Able to lift both arms off the bed. Some movement in legs.  Data Reviewed: Basic Metabolic Panel:  Lab 10/27/11 8657  NA 137  K 4.2  CL 101  CO2 24  GLUCOSE 102*  BUN 22  CREATININE 0.87  CALCIUM 9.7  MG --  PHOS --   CBC:  Lab 10/31/11 0521 10/30/11 1839 10/27/11 1342  WBC 6.2 7.2 6.3  NEUTROABS -- -- 4.2  HGB 10.4* 10.7* 10.4*  HCT 32.7* 33.7* 32.6*  MCV 82.8 83.8 84.0  PLT 237 215 238   CBG:  Lab 10/31/11 0801 10/30/11 2127 10/30/11 1704 10/30/11 1203 10/30/11 0750  GLUCAP 129* 195* 212* 142* 112*   Recent Results (from the past 240 hour(s))  MRSA PCR SCREENING     Status: Normal   Collection Time   10/27/11  7:09 PM      Component Value Range Status Comment   MRSA by PCR NEGATIVE  NEGATIVE Final   SURGICAL PCR SCREEN     Status: Normal   Collection Time   10/31/11  5:01 AM      Component Value Range Status Comment   MRSA, PCR NEGATIVE  NEGATIVE Final    Staphylococcus aureus NEGATIVE  NEGATIVE Final     Studies:  Scheduled Meds:    . captopril  25 mg Oral TID  . docusate sodium  100 mg Oral BID  . fluticasone  2 spray  Each Nare Daily  . insulin aspart  0-9 Units Subcutaneous TID WC  .  morphine injection  1 mg Intravenous Once  . polyethylene glycol  17 g Oral Daily  . senna  2 tablet Oral QHS  . simvastatin  20 mg Oral q1800  . sodium chloride  3 mL Intravenous Q12H  . white petrolatum       Continuous Infusions:    . heparin Stopped (10/30/11 2249)    Principal Problem:  *Quadriparesis Active Problems:  Spondylosis of cervical joint  Cervical myelopathy  DVT of right proximal brachial through the distal axillary vein, acute  Cervical stenosis of spine  Diabetes  mellitus, type 2  Complex regional pain syndrome of right upper extremity secondary to severe cervical spinal stenosis  Constipation due to pain medication     Brendia Sacks, MD  [facility] Pager [phone]. If 7PM-7AM, please contact night-coverage at [email or web address], password [facility] 10/31/2011, 9:30 AM  LOS: 4 days   Time spent: 10 minutes

## 2011-10-31 NOTE — Op Note (Signed)
Brief history: The patient is a 71 year old white male who has presented with a progressive Quadraparesis/cervical myelopathy. He was worked up with a cervical MRI which demonstrated multilevel disease severe stenosis at C4-5 and C5-6. I discussed the various treatment options with the patient and his family. The patient has weighed the risks, benefits, and alternatives surgery and decided to proceed with a C4-5 and C5-6 anterior cervical discectomy, fusion, and plating.  Preoperative diagnosis: C4-5 and C5-6 herniated disc, spondylosis, stenosis, cervical radiculopathy/myelopathy, cervicalgia  Postoperative diagnosis: The same  Procedure: C4-5 and C5-6 Anterior cervical discectomy/decompression; C4-5 and C5-6 interbody arthrodesis with local morcellized autograft bone and Actifuse bone graft extender; insertion of interbody prosthesis at C4-5 and C5-6 (Zimmer peek interbody prosthesis); anterior cervical plating from C4-C6 with globus titanium plate  Surgeon: Dr. Delma Officer  Asst.: Dr. Barbaraann Barthel  Anesthesia: Gen. endotracheal  Estimated blood loss: 125 cc  Drains: None  Complications: None  Description of procedure: The patient was brought to the operating room by the anesthesia team. General endotracheal anesthesia was induced. A roll was placed under the patient's shoulders to keep the neck in the neutral position. The patient's anterior cervical region was then prepared with Betadine scrub and Betadine solution. Sterile drapes were applied.  The area to be incised was then injected with Marcaine with epinephrine solution. I then used a scalpel to make a transverse incision in the patient's left anterior neck. I used the Metzenbaum scissors to divide the platysmal muscle and then to dissect medial to the sternocleidomastoid muscle, jugular vein, and carotid artery. I carefully dissected down towards the anterior cervical spine identifying the esophagus and retracting it medially. Then  using Kitner swabs to clear soft tissue from the anterior cervical spine. We then inserted a bent spinal needle into the upper exposed intervertebral disc space. We then obtained intraoperative radiographs confirm our location.  I then used electrocautery to detach the medial border of the longus colli muscle bilaterally from the C4-5 and C5-6 intervertebral disc spaces. I then inserted the Caspar self-retaining retractor underneath the longus colli muscle bilaterally to provide exposure.  We then incised the intervertebral disc at C4-5. The disc space was quite spondylotic. In order to get into the disc space we had to drill away some ventral spondylosis. We then performed a partial intervertebral discectomy with a pituitary forceps and the Karlin curettes. I then inserted distraction screws into the vertebral bodies at T4 and C5. We then distracted the interspace. We then used the high-speed drill to decorticate the vertebral endplates at C4-5, to drill away the remainder of the intervertebral disc, to drill away some posterior spondylosis, and to thin out the posterior longitudinal ligament. I then incised ligament with the arachnoid knife. We then removed the ligament with a Kerrison punches undercutting the vertebral endplates and decompressing the thecal sac. We then performed foraminotomies about the bilateral C5 nerve roots. This completed the decompression at this level.  We then repeated this procedure in an analogous fashion at C5-C6 decompressing the C5-6 thecal sac and the bilateral C6 nerve roots.  We now turned our to attention to the interbody fusion. We used the trial spacers to determine the appropriate size for the interbody prosthesis. We then pre-filled prosthesis with a combination of local morcellized autograft bone that we obtained during decompression as well as Actifuse bone graft extender. We then inserted the prosthesis into the distracted interspace at C4-5 and C5-6. We then  removed the distraction screws. There was a good  snug fit of the prosthesis in the interspace.   Having completed the fusion we now turned attention to the anterior spinal instrumentation. We used the high-speed drill to drill away some anterior spondylosis at the disc spaces so that the plate lay down flat. We selected the appropriate length titanium anterior cervical plate. We laid it along the anterior aspect of the vertebral bodies from C4-C6. We then drilled our team mm holes at C4, C5 and C6. We then secured the plate to the vertebral bodies by placing two 14 mm self-tapping screws at C4, C5 and C6. We then obtained intraoperative radiograph. The demonstrating good position of the instrumentation. We therefore secured the screws the plate the locking each cam. This completed the instrumentation.  We then obtained hemostasis using bipolar electrocautery. We irrigated the wound out with bacitracin solution. We then removed the retractor. We inspected the esophagus for any damage. There was none apparent. We then reapproximated patient's platysmal muscle with interrupted 3-0 Vicryl suture. We then reapproximated the subcutaneous tissue with interrupted 3-0 Vicryl suture. The skin was reapproximated with Steri-Strips and benzoin. The wound was then covered with bacitracin ointment. A sterile dressing was applied. The drapes were removed. Patient was subsequently extubated by the anesthesia team and transported to the post anesthesia care unit in stable condition. All sponge instrument and needle counts were correct at the end of this case.

## 2011-10-31 NOTE — Anesthesia Preprocedure Evaluation (Addendum)
Anesthesia Evaluation  Patient identified by MRN, date of birth, ID band Patient awake    Reviewed: Allergy & Precautions, H&P , NPO status , Patient's Chart, lab work & pertinent test results  Airway Mallampati: I TM Distance: >3 FB Neck ROM: full    Dental   Pulmonary          Cardiovascular hypertension, Rhythm:regular Rate:Normal     Neuro/Psych  Neuromuscular disease    GI/Hepatic   Endo/Other  Type 2, Oral Hypoglycemic Agents  Renal/GU      Musculoskeletal   Abdominal   Peds  Hematology   Anesthesia Other Findings Quadripresis  Reproductive/Obstetrics                         Anesthesia Physical Anesthesia Plan  ASA: III  Anesthesia Plan: General   Post-op Pain Management:    Induction: Intravenous  Airway Management Planned: Oral ETT  Additional Equipment:   Intra-op Plan:   Post-operative Plan: Extubation in OR and Possible Post-op intubation/ventilation  Informed Consent: I have reviewed the patients History and Physical, chart, labs and discussed the procedure including the risks, benefits and alternatives for the proposed anesthesia with the patient or authorized representative who has indicated his/her understanding and acceptance.     Plan Discussed with: CRNA, Anesthesiologist and Surgeon  Anesthesia Plan Comments: (Glide scope intubation neck neutral)       Anesthesia Quick Evaluation

## 2011-11-01 ENCOUNTER — Encounter (HOSPITAL_COMMUNITY): Payer: Self-pay | Admitting: Neurosurgery

## 2011-11-01 LAB — BASIC METABOLIC PANEL
BUN: 26 mg/dL — ABNORMAL HIGH (ref 6–23)
Creatinine, Ser: 0.83 mg/dL (ref 0.50–1.35)
GFR calc Af Amer: >90 mL/min (ref >90)
GFR calc non Af Amer: 87 mL/min — ABNORMAL LOW (ref >90)

## 2011-11-01 LAB — CBC
HCT: 35.9 % — ABNORMAL LOW (ref 39.0–52.0)
MCHC: 32 g/dL (ref 30.0–36.0)
MCV: 84.1 fL (ref 78.0–100.0)
Platelets: 228 10*3/uL (ref 150–400)
RDW: 22.4 % — ABNORMAL HIGH (ref 11.5–15.5)
WBC: 8.6 10*3/uL (ref 4.0–10.5)

## 2011-11-01 NOTE — Progress Notes (Signed)
Physical medicine and rehabilitation consult requested. Patient's status post C4-5 and C5-6 anterior cervical discectomy with decompression 10/31/2011. Patient currently resides at [facility]. Will await physical and occupational therapy to determine disposition and discharge plan. Plan to followup a formal rehabilitation consult after evaluations completed

## 2011-11-01 NOTE — Progress Notes (Signed)
Patient ID: [name], male   DOB: 03-Feb-1941, 71 y.o.   MRN: [id number] Subjective:  The patient is alert and pleasant. He looks well.  Objective: Vital signs in last 24 hours: Temp:  [97.2 F (36.2 C)-97.4 F (36.3 C)] 97.4 F (36.3 C) (07/30 0000) Pulse Rate:  [75-104] 75  (07/30 0700) Resp:  [9-27] 10  (07/30 0700) BP: (118-178)/(56-99) 142/60 mmHg (07/30 0700) SpO2:  [97 %-100 %] 100 % (07/30 0700)  Intake/Output from previous day: 07/29 0701 - 07/30 0700 In: 2800 [I.V.:2800] Out: 2600 [Urine:2500; Blood:100] Intake/Output this shift:    Physical exam the patient is alert and oriented. His dressing is clean and dry. There is no evidence of hematoma or shift. His strength has improved since surgery. His bilateral triceps strength is 4/5 his left hand grip strength is 3-4/5, 3/5 on the right  Lab Results:  Basename 11/01/11 0342 10/31/11 0521  WBC 8.6 6.2  HGB 11.5* 10.4*  HCT 35.9* 32.7*  PLT 228 237   BMET  Basename 11/01/11 0342  NA 135  K 4.7  CL 100  CO2 27  GLUCOSE 236*  BUN 26*  CREATININE 0.83  CALCIUM 9.9    Studies/Results: Dg Cervical Spine 2-3 Views  10/31/2011  z*RADIOLOGY REPORT*  Clinical Data: ACDF at C4-5, C5-6.  CERVICAL SPINE - 2-3 VIEW  Comparison: CT of the brain 10/27/2011  Findings: The patient has undergone anterior plate fusion of C4-C6. Detail below C6 is limited.  A second image shows intraoperative localization at C4-5. Large anterior osteophytes identified at C3- 4.  IMPRESSION:  1.  Intraoperative localization. 2.  Anterior plate fusion of C4-C6.  Original Report Authenticated By: Patterson Hammersmith, M.D.    Assessment/Plan: Postop day 1: The patient's strength has improved since surgery although he is still quite weak. He will need rehabilitation. We will mobilize him with PT and OT.  LOS: 5 days     Daianna Vasques D 11/01/2011, 7:49 AM

## 2011-11-01 NOTE — Evaluation (Signed)
Occupational Therapy Evaluation Patient Details Name: [name] MRN: [id number] DOB: 1941-02-22 Today's Date: 11/01/2011 Time: 5621-3086 OT Time Calculation (min): 51 min  OT Assessment / Plan / Recommendation Clinical Impression  This 71 y.o. male admitted with progress quadraparesis.  pt. with significant spinal stenosis.  Pt. underwent C4-6 decompression and fusion.  Pt. demonstrates quadraparesis.  Currently he requires Total A with all BADLs and functional mobility.  He will benefit from OT to maximize safety and independence with BADLs to allow pt. to perform simple UE ADLs at min - mod A.  Pt. will require 24 hour assistance at discharge, and wife unable to provide this level of assist - will likely require SNF     OT Assessment  Patient needs continued OT Services    Follow Up Recommendations  Skilled nursing facility    Barriers to Discharge Decreased caregiver support    Equipment Recommendations  Defer to next venue    Recommendations for Other Services    Frequency  Min 2X/week    Precautions / Restrictions Precautions Precautions: Fall;Cervical Required Braces or Orthoses: Cervical Brace Cervical Brace: Hard collar Restrictions Weight Bearing Restrictions: No       ADL  Eating/Feeding: Simulated;+1 Total assistance Where Assessed - Eating/Feeding: Bed level Grooming: Simulated;+1 Total assistance Where Assessed - Grooming: Supine, head of bed up Upper Body Bathing: Simulated;+1 Total assistance Where Assessed - Upper Body Bathing: Supine, head of bed up Lower Body Bathing: Simulated;+1 Total assistance Where Assessed - Lower Body Bathing: Supine, head of bed up;Supine, head of bed flat;Rolling right and/or left Upper Body Dressing: Simulated;+1 Total assistance Where Assessed - Upper Body Dressing: Supine, head of bed up;Rolling right and/or left Lower Body Dressing: Simulated;+1 Total assistance Where Assessed - Lower Body Dressing: Supine, head of  bed flat;Rolling right and/or left;Lean right and/or left Toilet Transfer: Simulated;+2 Total assistance Toilet Transfer: Patient Percentage: 20% Toilet Transfer Method: Stand pivot Toileting - Clothing Manipulation and Hygiene: Simulated;+1 Total assistance Where Assessed - Glass blower/designer Manipulation and Hygiene: Sit to stand from 3-in-1 or toilet Transfers/Ambulation Related to ADLs: Pt. requires total A +2.  pt. requires cues/facilitation for trunk extension ADL Comments: Pt. is total A with all aspects of BADLs due to UE weakness    OT Diagnosis: Generalized weakness;Paresis;Acute pain  OT Problem List: Decreased strength;Decreased range of motion;Decreased activity tolerance;Impaired balance (sitting and/or standing);Decreased coordination;Decreased knowledge of use of DME or AE;Decreased knowledge of precautions;Impaired sensation;Impaired UE functional use;Pain OT Treatment Interventions: Self-care/ADL training;Therapeutic exercise;Neuromuscular education;DME and/or AE instruction;Manual therapy;Therapeutic activities;Splinting;Patient/family education;Balance training   OT Goals Acute Rehab OT Goals OT Goal Formulation: With patient Time For Goal Achievement: 11/15/11 Potential to Achieve Goals: Good ADL Goals Pt Will Perform Eating: with mod assist;with adaptive utensils;Supported;Supine, head of bed up;Sitting, chair ADL Goal: Eating - Progress: Goal set today Pt Will Perform Grooming: with mod assist;Sitting, chair;Supine, head of bed up;Supported;with adaptive equipment ADL Goal: Grooming - Progress: Goal set today Pt Will Perform Upper Body Bathing: with mod assist;Sitting, chair;Supine, head of bed flat;Supported ADL Goal: Upper Body Bathing - Progress: Goal set today Pt Will Transfer to Toilet: Stand pivot transfer;with max assist;3-in-1 ADL Goal: Toilet Transfer - Progress: Goal set today Additional ADL Goal #1: Pt. will be independent with HEP for bil UE AROM ADL  Goal: Additional Goal #1 - Progress: Goal set today Additional ADL Goal #2: Pt. will be sit EOB with min A x 10 mins while performing simple grooming activites with minA for balance ADL Goal:  Additional Goal #2 - Progress: Goal set today  Visit Information  Last OT Received On: 11/01/11 Assistance Needed: +2 PT/OT Co-Evaluation/Treatment: Yes    Subjective Data  Subjective: Are you sure this isn't too early to be getting me out of bed since I just had surgery yesterday?" Patient Stated Goal: To regain strength   Prior Functioning  Vision/Perception  Home Living Lives With: Spouse;Other (Comment) (wife is an amputee) Available Help at Discharge: Other (Comment) Type of Home: House Home Access: Stairs to enter [employer] of Steps: 3 Entrance Stairs-Rails: None Home Layout: Two level;Able to live on main level with bedroom/bathroom Bathroom Shower/Tub: Tub/shower unit;Curtain Bathroom Toilet: Standard Home Adaptive Equipment: Bedside commode/3-in-1;Grab bars in shower;Shower chair without back;Wheelchair - [employer] - four wheeled;Walker - rolling Additional Comments: Pt. has been in SNF for rehab since recent discharge from hospital the first of July Prior Function Level of Independence: Independent with assistive device(s) Able to Take Stairs?: Yes Driving: No Vocation: Other (comment) Comments: [employer] Communication Communication: HOH Dominant Hand: Left   Vision - Assessment Vision Assessment: Vision not tested  Cognition  Overall Cognitive Status: Appears within functional limits for tasks assessed/performed Arousal/Alertness: Awake/alert Orientation Level: Appears intact for tasks assessed Behavior During Session: [facility] for tasks performed Cognition - Other Comments: Pt. is irritable    Extremity/Trunk Assessment Right Upper Extremity Assessment RUE ROM/Strength/Tone: Deficits RUE ROM/Strength/Tone Deficits: Rt. shoulder grossly 1+/5; elbow 2-/5;  hand 2/5 RUE Sensation: Deficits RUE Sensation Deficits: Pt. with impaired sensation throughout, but worse distally  RUE Coordination: Deficits RUE Coordination Deficits: Pt. only with ~50% gross grasp Left Upper Extremity Assessment LUE ROM/Strength/Tone: Deficits LUE ROM/Strength/Tone Deficits: Lt. shoulder 2-/5; elbow 3/5; hand 3-/5 LUE Sensation: Deficits LUE Sensation Deficits: impaired sensation throughout LUE Coordination: Deficits LUE Coordination Deficits: Due to weakness Right Lower Extremity Assessment RLE ROM/Strength/Tone: Deficits RLE ROM/Strength/Tone Deficits: grossly 3+/5 quads, 3/5 Hams, 2+/5 hip flexors Left Lower Extremity Assessment LLE ROM/Strength/Tone: Deficits LLE ROM/Strength/Tone Deficits: grossly 3+/5 quads and hams; >=2+/5 hip flexors Trunk Assessment Trunk Assessment: Kyphotic;Other exceptions Trunk Exceptions: Pt. with trunk weakness throughout.  Pt. unable to initiate lower trunk movements.  Utilizes upper trunk and momentum with hip extension to achieve trunk extension   Mobility Bed Mobility Bed Mobility: Sit to Supine;Rolling Left Rolling Left: 2: Max assist Supine to Sit: 1: +2 Total assist Supine to Sit: Patient Percentage: 20% Sitting - Scoot to Edge of Bed: 1: +1 Total assist Sit to Supine: 1: +2 Total assist Sit to Supine: Patient Percentage: 0% Details for Bed Mobility Assistance: vc's for safe technique and encouragement to attempt each task; truncal assist/support extremity assist/support Transfers Transfers: Sit to Stand Sit to Stand: 1: +2 Total assist;Without upper extremity assist;From bed Sit to Stand: Patient Percentage: 20% Stand to Sit: 1: +2 Total assist;To chair/3-in-1 Stand to Sit: Patient Percentage: 20% Details for Transfer Assistance: vc's for safe technique; stability assist lower thrunk; lifting assist and support for transfer   Exercise    Balance Balance Balance Assessed: Yes Static Sitting Balance Static Sitting -  Balance Support: Left upper extremity supported;No upper extremity supported;Feet supported Static Sitting - Level of Assistance: 3: Mod assist Static Sitting - Comment/# of Minutes: 8  End of Session OT - End of Session Activity Tolerance: Patient tolerated treatment well Patient left: in chair;with call bell/phone within reach Nurse Communication: Mobility status  GO     Sabryna Lahm, Ursula Alert M 11/01/2011, 3:10 PM

## 2011-11-01 NOTE — Progress Notes (Addendum)
11/01/11 1200  PT Visit Information  Last PT Received On 11/01/11  Assistance Needed +2  PT/OT Co-Evaluation/Treatment Yes  PT Time Calculation  PT Start Time 1105  PT Stop Time 1134  PT Time Calculation (min) 29 min  Subjective Data  Subjective I can't...  Patient Stated Goal to return home  Precautions  Precautions Fall  Required Braces or Orthoses Cervical Brace  Cervical Brace Hard collar  Home Living  Lives With Spouse;Other (Comment)  Available Help at Discharge Other (Comment)  Type of Home House  Home Access Stairs to enter  Entrance Stairs-Number of Steps 3  Entrance Stairs-Rails None  Home Layout Two level;Able to live on main level with bedroom/bathroom  Bathroom Shower/Tub Tub/shower unit;Curtain  Data processing manager Bedside commode/3-in-1;Grab bars in shower;Shower chair without back;Wheelchair - [employer] - four wheeled;Walker - rolling  Prior Function  Level of Independence Independent with assistive device(s)  Able to Take Stairs? Yes  Driving No  Vocation Other (comment)  Communication  Communication HOH  Cognition  Overall Cognitive Status Appears within functional limits for tasks assessed/performed  Arousal/Alertness Awake/alert  Orientation Level Appears intact for tasks assessed  Behavior During Session [facility] for tasks performed  Right Lower Extremity Assessment  RLE ROM/Strength/Tone Deficits  RLE ROM/Strength/Tone Deficits grossly 3+/5 quads, 3/5 Hams, 2+/5 hip flexors  Left Lower Extremity Assessment  LLE ROM/Strength/Tone Deficits  LLE ROM/Strength/Tone Deficits grossly 3+/5 quads and hams; >=2+/5 hip flexors  Trunk Assessment  Trunk Assessment Kyphotic  Bed Mobility  Bed Mobility Rolling Left;Supine to Sit;Sitting - Scoot to [place] of Bed;Sit to Supine  Rolling Left 2: Max assist  Supine to Sit 1: +2 Total assist  Supine to Sit: Patient Percentage 20%  Sitting - Scoot to Edge of Bed 1: +1 Total assist    Details for Bed Mobility Assistance vc's for safe technique and encouragement to attempt each task; truncal assist/support extremity assist/support  Transfers  Transfers Sit to Stand;Stand to Sit;Stand Pivot Transfers  Sit to Stand 1: +2 Total assist;Without upper extremity assist;From bed  Sit to Stand: Patient Percentage 30%  Stand to Sit 1: +2 Total assist;To chair/3-in-1  Stand to Sit: Patient Percentage 20%  Stand Pivot Transfers 1: +2 Total assist  Stand Pivot Transfers: Patient Percentage 30%  Details for Transfer Assistance vc's for safe technique; stability assist lower thrunk; lifting assist and support for transfer  Ambulation/Gait  Ambulation/Gait Assistance Not tested (comment)  Wheelchair Mobility  Wheelchair Mobility No  Balance  Balance Assessed Yes  Static Sitting Balance  Static Sitting - Balance Support Left upper extremity supported;No upper extremity supported;Feet supported  Static Sitting - Level of Assistance 3: Mod assist  Static Sitting - Comment/# of Minutes 8  PT - End of Session  Equipment Utilized During Treatment Cervical collar  Activity Tolerance Patient limited by fatigue  Patient left in chair;with call bell/phone within reach  Nurse Communication Mobility status;Need for lift equipment  PT Assessment  Clinical Impression Statement pt s/p c4-6 decompression/ant. plate fusion for signifficant stenosis.  Pt now significantly weak upper ext, lower trunk  and proximal> than distal LE's as well as poor activity tolerance. Pt could benefit from CIR level of therapy.  PT Recommendation/Assessment Patient needs continued PT services  PT Problem List Decreased strength;Decreased activity tolerance;Decreased balance;Decreased mobility;Decreased coordination;Decreased knowledge of use of DME;Decreased knowledge of precautions;Impaired sensation;Pain  Barriers to Discharge Decreased caregiver support  PT Therapy Diagnosis  Difficulty walking;Generalized  weakness;Acute pain;Other (comment) (quadraparesis)  PT Plan  PT Frequency Min 5X/week  PT Treatment/Interventions DME instruction;Functional mobility training;Therapeutic activities;Therapeutic exercise;Balance training;Neuromuscular re-education;Patient/family education (pregait activities progressing to gait)  PT Recommendation  Follow Up Recommendations Inpatient Rehab  Equipment Recommended Defer to next venue  Individuals Consulted  Consulted and Agree with Results and Recommendations Patient  Acute Rehab PT Goals  PT Goal Formulation With patient/family  Time For Goal Achievement 11/15/11  Potential to Achieve Goals Good  Pt will go Supine/Side to Sit with mod assist  PT Goal: Supine/Side to Sit - Progress Goal set today  Pt will go Sit to Stand with mod assist  PT Goal: Sit to Stand - Progress Goal set today  Pt will go Stand to Sit with mod assist  PT Goal: Stand to Sit - Progress Goal set today  Pt will Transfer Bed to Chair/Chair to Bed with max assist  PT Transfer Goal: Bed to Chair/Chair to Bed - Progress Goal set today  PT General Charges  $$ ACUTE PT VISIT 1 Procedure  PT Evaluation  $Initial PT Evaluation Tier II 1 Procedure  PT Treatments  $Therapeutic Activity 8-22 mins  Written Expression  Dominant Hand Left   11/01/2011  [place] Bing, PT [phone] (pager)

## 2011-11-01 NOTE — Evaluation (Signed)
Physical Therapy Evaluation Patient Details Name: [name] MRN: [id number] DOB: 1940/11/09 Today's Date: 11/01/2011 Time: 1308-6578 PT Time Calculation (min): 29 min  PT Assessment / Plan / Recommendation Clinical Impression  pt s/p c4-6 decompression/ant. plate fusion for signifficant stenosis.  Pt now significantly weak upper ext, lower trunk  and proximal> than distal LE's as well as poor activity tolerance. Pt could benefit from CIR level of therapy.    PT Assessment  Patient needs continued PT services    Follow Up Recommendations  Inpatient Rehab    Barriers to Discharge Decreased caregiver support      Equipment Recommendations  Defer to next venue    Recommendations for Other Services     Frequency Min 3X/week    Precautions / Restrictions Precautions Precautions: Fall Required Braces or Orthoses: Cervical Brace Cervical Brace: Hard collar   Pertinent Vitals/Pain       Mobility  Bed Mobility Bed Mobility: Rolling Left;Supine to Sit;Sitting - Scoot to [place] of Bed;Sit to Supine Rolling Left: 2: Max assist Supine to Sit: 1: +2 Total assist Supine to Sit: Patient Percentage: 20% Sitting - Scoot to Edge of Bed: 1: +1 Total assist Sit to Supine: 1: +2 Total assist Sit to Supine: Patient Percentage: 0% Details for Bed Mobility Assistance: vc's for safe technique and encouragement to attempt each task; truncal assist/support extremity assist/support Transfers Transfers: Sit to Stand;Stand to Sit;Stand Pivot Transfers Sit to Stand: 1: +2 Total assist;Without upper extremity assist;From bed Sit to Stand: Patient Percentage: 30% Stand to Sit: 1: +2 Total assist;To chair/3-in-1 Stand to Sit: Patient Percentage: 20% Stand Pivot Transfers: 1: +2 Total assist Stand Pivot Transfers: Patient Percentage: 30% Details for Transfer Assistance: vc's for safe technique; stability assist lower thrunk; lifting assist and support for  transfer Ambulation/Gait Ambulation/Gait Assistance: Not tested (comment) Wheelchair Mobility Wheelchair Mobility: No    Exercises     PT Diagnosis: Difficulty walking;Generalized weakness;Acute pain;Other (comment) (quadraparesis)  PT Problem List: Decreased strength;Decreased activity tolerance;Decreased balance;Decreased mobility;Decreased coordination;Decreased knowledge of use of DME;Decreased knowledge of precautions;Impaired sensation;Pain PT Treatment Interventions: DME instruction;Functional mobility training;Therapeutic activities;Therapeutic exercise;Balance training;Neuromuscular re-education;Patient/family education (pregait activities progressing to gait)   PT Goals Acute Rehab PT Goals PT Goal Formulation: With patient/family Time For Goal Achievement: 11/15/11 Potential to Achieve Goals: Good Pt will go Supine/Side to Sit: with mod assist PT Goal: Supine/Side to Sit - Progress: Goal set today Pt will go Sit to Stand: with mod assist PT Goal: Sit to Stand - Progress: Goal set today Pt will go Stand to Sit: with mod assist PT Goal: Stand to Sit - Progress: Goal set today Pt will Transfer Bed to Chair/Chair to Bed: with max assist PT Transfer Goal: Bed to Chair/Chair to Bed - Progress: Goal set today  Visit Information  Last PT Received On: 11/01/11 Assistance Needed: +2 PT/OT Co-Evaluation/Treatment: Yes    Subjective Data  Subjective: I can't... Patient Stated Goal: to return home   Prior Functioning  Home Living Lives With: Spouse;Other (Comment) Available Help at Discharge: Other (Comment) Type of Home: House Home Access: Stairs to enter [employer] of Steps: 3 Entrance Stairs-Rails: None Home Layout: Two level;Able to live on main level with bedroom/bathroom Bathroom Shower/Tub: Tub/shower unit;Curtain Bathroom Toilet: Standard Home Adaptive Equipment: Bedside commode/3-in-1;Grab bars in shower;Shower chair without back;Wheelchair -  [employer] - four wheeled;Walker - rolling Prior Function Level of Independence: Independent with assistive device(s) Able to Take Stairs?: Yes Driving: No Vocation: Other (comment) Communication Communication: HOH Dominant Hand:  Left    Cognition  Overall Cognitive Status: Appears within functional limits for tasks assessed/performed Arousal/Alertness: Awake/alert Orientation Level: Appears intact for tasks assessed Behavior During Session: [facility] for tasks performed    Extremity/Trunk Assessment Right Lower Extremity Assessment RLE ROM/Strength/Tone: Deficits RLE ROM/Strength/Tone Deficits: grossly 3+/5 quads, 3/5 Hams, 2+/5 hip flexors Left Lower Extremity Assessment LLE ROM/Strength/Tone: Deficits LLE ROM/Strength/Tone Deficits: grossly 3+/5 quads and hams; >=2+/5 hip flexors Trunk Assessment Trunk Assessment: Kyphotic   Balance Balance Balance Assessed: Yes Static Sitting Balance Static Sitting - Balance Support: Left upper extremity supported;No upper extremity supported;Feet supported Static Sitting - Level of Assistance: 3: Mod assist Static Sitting - Comment/# of Minutes: 8  End of Session PT - End of Session Equipment Utilized During Treatment: Cervical collar Activity Tolerance: Patient limited by fatigue Patient left: in chair;with call bell/phone within reach Nurse Communication: Mobility status;Need for lift equipment  GP     Imonie Tuch, Eliseo Gum 11/01/2011, 12:23 PM  11/01/2011  [place] Bing, PT [phone] (pager)

## 2011-11-01 NOTE — Progress Notes (Signed)
Physical Therapy Treatment Patient Details Name: [name] MRN: [id number] DOB: 04-Mar-1941 Today's Date: 11/01/2011 Time: 7829-5621 PT Time Calculation (min): 14 min  PT Assessment / Plan / Recommendation Comments on Treatment Session  Pt unable to stay in chair long due to painful bottom from stage 1 to 2 buttock wound.    Follow Up Recommendations  Inpatient Rehab    Barriers to Discharge Decreased caregiver support      Equipment Recommendations  Defer to next venue    Recommendations for Other Services    Frequency Min 3X/week   Plan Discharge plan remains appropriate;Frequency remains appropriate    Precautions / Restrictions Precautions Precautions: Fall;Cervical Required Braces or Orthoses: Cervical Brace Cervical Brace: Hard collar   Pertinent Vitals/Pain     Mobility  Bed Mobility Bed Mobility: Sit to Supine;Rolling Left Rolling Left: 2: Max assist Supine to Sit: 1: +2 Total assist Supine to Sit: Patient Percentage: 20% Sitting - Scoot to Edge of Bed: 1: +1 Total assist Sit to Supine: 1: +2 Total assist Sit to Supine: Patient Percentage: 0% Details for Bed Mobility Assistance: vc's for safe technique and encouragement to attempt each task; truncal assist/support extremity assist/support Transfers Transfers: Stand Pivot Transfers;Sit to Stand;Stand to Sit Sit to Stand: 1: +2 Total assist;Without upper extremity assist;From bed Sit to Stand: Patient Percentage: 20% Stand to Sit: 1: +2 Total assist;To chair/3-in-1 Stand to Sit: Patient Percentage: 20% Stand Pivot Transfers: 1: +2 Total assist Stand Pivot Transfers: Patient Percentage: 20% Details for Transfer Assistance: vc's for safe technique; stability assist lower thrunk; lifting assist and support for transfer Ambulation/Gait Ambulation/Gait Assistance: Not tested (comment) Wheelchair Mobility Wheelchair Mobility: No    Exercises     PT Diagnosis: Difficulty walking;Generalized weakness;Acute  pain;Other (comment) (quadraparesis)  PT Problem List: Decreased strength;Decreased activity tolerance;Decreased balance;Decreased mobility;Decreased coordination;Decreased knowledge of use of DME;Decreased knowledge of precautions;Impaired sensation;Pain PT Treatment Interventions: DME instruction;Functional mobility training;Therapeutic activities;Therapeutic exercise;Balance training;Neuromuscular re-education;Patient/family education (pregait activities progressing to gait)   PT Goals Acute Rehab PT Goals PT Goal Formulation: With patient/family Time For Goal Achievement: 11/15/11 Potential to Achieve Goals: Good Pt will go Supine/Side to Sit: with mod assist PT Goal: Supine/Side to Sit - Progress: Goal set today Pt will go Sit to Stand: with mod assist PT Goal: Sit to Stand - Progress: Goal set today Pt will go Stand to Sit: with mod assist PT Goal: Stand to Sit - Progress: Goal set today Pt will Transfer Bed to Chair/Chair to Bed: with max assist PT Transfer Goal: Bed to Chair/Chair to Bed - Progress: Goal set today  Visit Information  Last PT Received On: 11/01/11 Assistance Needed: +2 PT/OT Co-Evaluation/Treatment: Yes    Subjective Data  Subjective: There's like an iron rod poking into my right butt. Patient Stated Goal: to return home   Cognition  Overall Cognitive Status: Appears within functional limits for tasks assessed/performed Arousal/Alertness: Awake/alert Orientation Level: Appears intact for tasks assessed Behavior During Session: [facility] for tasks performed    Balance  Balance Balance Assessed: Yes Static Sitting Balance Static Sitting - Balance Support: Left upper extremity supported;No upper extremity supported;Feet supported Static Sitting - Level of Assistance: 3: Mod assist Static Sitting - Comment/# of Minutes: 8  End of Session PT - End of Session Equipment Utilized During Treatment: Cervical collar Activity Tolerance: Patient limited by  fatigue Patient left: in bed;with call bell/phone within reach Nurse Communication: Mobility status;Need for lift equipment   GP     Keah Lamba, Eliseo Gum  11/01/2011, 12:32 PM  11/01/2011  [place] Bing, PT [phone] (pager)

## 2011-11-01 NOTE — Clinical Social Work Psychosocial (Signed)
     Clinical Social Work Department BRIEF PSYCHOSOCIAL ASSESSMENT 11/01/2011  Patient:  KERRIE, LATOUR     Account Number:  [id number]     Admit date:  10/27/2011  Clinical Social Worker:  Jacelyn Grip  Date/Time:  11/01/2011 04:30 PM  Referred by:  Physician  Date Referred:  11/01/2011 Referred for  SNF Placement   Other Referral:   Interview type:  Patient Other interview type:    PSYCHOSOCIAL DATA Living Status:  FACILITY Admitted from facility:  [facility] Level of care:  Skilled Nursing Facility Primary support name:  Vernona Rieger Kittler/[phone] Primary support relationship to patient:  SPOUSE Degree of support available:   adequate    CURRENT CONCERNS Current Concerns  Post-Acute Placement   Other Concerns:    SOCIAL WORK ASSESSMENT / PLAN CSW received referral for New SNF placement. CSW reviewed chart and noted that pt admitted from [facility]. CSW met with pt at bedside, introduced self and explained role. CSW discussed recommendation for continued rehab at discharge and assessed if pt plans to return to [facility]. Pt discussed that he does not want to return to [facility] and would like to explore other options. Pt discussed that pt wife lives in [place] and pt would like placement that is easily accessible to pt wife. CSW discused options for placement in [place] and [place] considering [facility] is easily accessible to both counties. Pt is agreeable to SNF search in [place] and [place]. CSW discussed insurance copayments for SNF and clarified pt questions in regard to Medicaid. Pt reports that he has been denied Medicaid in the past, but may consider reapplying. CSW to complete FL2 and initiate SNF search excluding [facility] at pt request. CSW to follow up with pt in regard to bed offers and discuss with pt wife. CSW to continue to follow and facilitate pt discharge needs when pt medically stable for discharge.    Assessment/plan status:  Psychosocial Support/Ongoing Assessment of Needs Other assessment/ plan:   discharge planning   Information/referral to community resources:   [facility] and [facility] lists    PATIENTS/FAMILYS RESPONSE TO PLAN OF CARE: Pt alert and oriented x 4. Pt discussed that he was not satisfied at [facility] and is hopeful to find another facility that is easy for pt wife to travel to. Pt is anxious about copayments at SNF, but recognizes that he and pt wife are unable to care for pt at home.

## 2011-11-01 NOTE — Progress Notes (Signed)
Occupational Therapy Treatment Patient Details Name: [name] MRN: [id number] DOB: [birth year] Today's Date: 11/01/2011 Time: [id number] OT Time Calculation (min): 22 min  OT Assessment / Plan / Recommendation Comments on Treatment Session Pt. with very poor tolerance for sitting up in recliner due to stage 1-2 buttock decubitus.  Pt. assisted back to bed    Follow Up Recommendations  Skilled nursing facility    Barriers to Discharge  Decreased caregiver support    Equipment Recommendations  Defer to next venue    Recommendations for Other Services    Frequency Min 2X/week   Plan Discharge plan remains appropriate    Precautions / Restrictions Precautions Precautions: Fall;Cervical Required Braces or Orthoses: Cervical Brace Cervical Brace: Hard collar Restrictions Weight Bearing Restrictions: No   Pertinent Vitals/Pain     ADL  Eating/Feeding: Simulated;+1 Total assistance Where Assessed - Eating/Feeding: Bed level Grooming: Simulated;+1 Total assistance Where Assessed - Grooming: Supine, head of bed up Upper Body Bathing: Simulated;+1 Total assistance Where Assessed - Upper Body Bathing: Supine, head of bed up Lower Body Bathing: Simulated;+1 Total assistance Where Assessed - Lower Body Bathing: Supine, head of bed up;Supine, head of bed flat;Rolling right and/or left Upper Body Dressing: Simulated;+1 Total assistance Where Assessed - Upper Body Dressing: Supine, head of bed up;Rolling right and/or left Lower Body Dressing: Simulated;+1 Total assistance Where Assessed - Lower Body Dressing: Supine, head of bed flat;Rolling right and/or left;Lean right and/or left Toilet Transfer: Simulated;+2 Total assistance Toilet Transfer: Patient Percentage: 20% Toilet Transfer Method: Stand pivot Toileting - Clothing Manipulation and Hygiene: Simulated;+1 Total assistance Where Assessed - Toileting Clothing Manipulation and Hygiene: Sit to stand from 3-in-1 or  toilet Transfers/Ambulation Related to ADLs: Pt. requires total A +2.  pt. requires cues/facilitation for trunk extension ADL Comments: Pt. left in chair after eval, and within 4 mins. pt. insisting that he needs to return to bed.  PT and OT assisted pt. back to bed.  Pt. very irritable due to neck and buttock pain due to stag 1-2 decubitus.  Pt. stating he shouldn't have gotten up because he is now uncomfortable.  Explained the importance of mobilty and activity.  Pt. required extensive amount of time to reposition him in bed    OT Diagnosis: Generalized weakness;Paresis;Acute pain  OT Problem List: Decreased strength;Decreased range of motion;Decreased activity tolerance;Impaired balance (sitting and/or standing);Decreased coordination;Decreased knowledge of use of DME or AE;Decreased knowledge of precautions;Impaired sensation;Impaired UE functional use;Pain OT Treatment Interventions: Self-care/ADL training;Therapeutic exercise;Neuromuscular education;DME and/or AE instruction;Manual therapy;Therapeutic activities;Splinting;Patient/family education;Balance training   OT Goals Acute Rehab OT Goals OT Goal Formulation: With patient Time For Goal Achievement: 11/15/11 Potential to Achieve Goals: Good ADL Goals Pt Will Perform Eating: with mod assist;with adaptive utensils;Supported;Supine, head of bed up;Sitting, chair ADL Goal: Eating - Progress: Goal set today Pt Will Perform Grooming: with mod assist;Sitting, chair;Supine, head of bed up;Supported;with adaptive equipment ADL Goal: Grooming - Progress: Goal set today Pt Will Perform Upper Body Bathing: with mod assist;Sitting, chair;Supine, head of bed flat;Supported ADL Goal: Upper Body Bathing - Progress: Goal set today Pt Will Transfer to Toilet: Stand pivot transfer;with max assist;3-in-1 ADL Goal: Toilet Transfer - Progress: Progressing toward goals Additional ADL Goal #1: Pt. will be independent with HEP for bil UE AROM ADL Goal:  Additional Goal #1 - Progress: Goal set today Additional ADL Goal #2: Pt. will be sit EOB with min A x 10 mins while performing simple grooming activites with minA for balance ADL Goal: Additional Goal #2 -  Progress: Goal set today  Visit Information  Last OT Received On: 11/01/11 Assistance Needed: +2 PT/OT Co-Evaluation/Treatment: Yes    Subjective Data  Subjective: Are you sure this isn't too early to be getting me out of bed since I just had surgery yesterday?" Patient Stated Goal: To regain strength   Prior Functioning  Home Living Lives With: Spouse;Other (Comment) (wife is an amputee) Available Help at Discharge: Other (Comment) Type of Home: House Home Access: Stairs to enter [employer] of Steps: 3 Entrance Stairs-Rails: None Home Layout: Two level;Able to live on main level with bedroom/bathroom Bathroom Shower/Tub: Tub/shower unit;Curtain Bathroom Toilet: Standard Home Adaptive Equipment: Bedside commode/3-in-1;Grab bars in shower;Shower chair without back;Wheelchair - [employer] - four wheeled;Walker - rolling Additional Comments: Pt. has been in SNF for rehab since recent discharge from hospital the first of July Prior Function Level of Independence: Independent with assistive device(s) Able to Take Stairs?: Yes Driving: No Vocation: Other (comment) Comments: black smith Communication Communication: HOH Dominant Hand: Left    Cognition  Overall Cognitive Status: Appears within functional limits for tasks assessed/performed Arousal/Alertness: Awake/alert Orientation Level: Appears intact for tasks assessed Behavior During Session: [facility] for tasks performed Cognition - Other Comments: Pt. is irritable    Mobility Bed Mobility Bed Mobility: Sit to Supine;Rolling Left Rolling Left: 2: Max assist Supine to Sit: 1: +2 Total assist Supine to Sit: Patient Percentage: 20% Sitting - Scoot to Edge of Bed: 1: +1 Total assist Sit to Supine: 1: +2 Total  assist Sit to Supine: Patient Percentage: 0% Details for Bed Mobility Assistance: vc's for safe technique and encouragement to attempt each task; truncal assist/support extremity assist/support Transfers Transfers: Sit to Stand Sit to Stand: 1: +2 Total assist;Without upper extremity assist;From bed Sit to Stand: Patient Percentage: 20% Stand to Sit: 1: +2 Total assist;To chair/3-in-1 Stand to Sit: Patient Percentage: 20% Details for Transfer Assistance: vc's for safe technique; stability assist lower thrunk; lifting assist and support for transfer   Exercises    Balance Balance Balance Assessed: Yes Static Sitting Balance Static Sitting - Balance Support: Left upper extremity supported;No upper extremity supported;Feet supported Static Sitting - Level of Assistance: 3: Mod assist Static Sitting - Comment/# of Minutes: 8  End of Session OT - End of Session Equipment Utilized During Treatment: Cervical collar Activity Tolerance: Patient limited by pain Patient left: in bed;with call bell/phone within reach;with nursing in room Nurse Communication: Patient requests pain meds  GO     Shariece Viveiros M 11/01/2011, 3:20 PM

## 2011-11-02 LAB — GLUCOSE, CAPILLARY
Glucose-Capillary: 199 mg/dL — ABNORMAL HIGH (ref 70–99)
Glucose-Capillary: 166 mg/dL — ABNORMAL HIGH (ref 70–99)

## 2011-11-02 NOTE — Progress Notes (Signed)
Chaplain Note:  Chaplain visited briefly with pt.  Pt was awake, reclining in bed.  Nurse tech was feeding pt lunch at this time.  Chaplain will meet with pt again at a more convenient time.   11/02/11 1400  Clinical Encounter Type  Visited With Patient;Health care provider  Visit Type Spiritual support  Referral From Nurse  Spiritual Encounters  Spiritual Needs Emotional  Stress Factors  Patient Stress Factors Major life changes;Health changes  Family Stress Factors Other (Comment) (No family present)   Verdie Shire, chaplain resident [phone]

## 2011-11-02 NOTE — Progress Notes (Addendum)
10/04/11 1219  PT G-Codes **NOT FOR INPATIENT CLASS**  Functional Assessment Tool Used clinical judgement as reviewed in chart  Functional Limitation Mobility: Walking and moving around  Mobility: Walking and Moving Around Current Status (W0981) CI  Mobility: Walking and Moving Around Goal Status [phone]) CH   late entry for 10/04/2011 eval performed by Blanchard Kelch, PT.  As assessed by Clydie Braun and documented the above G-codes were assigned.  Marella Bile, PT Pager: [phone] 11/02/2011

## 2011-11-02 NOTE — Progress Notes (Addendum)
Clinical Social Work Department CLINICAL SOCIAL WORK PLACEMENT NOTE 11/02/2011  Patient:  [name], [name]  Account Number:  [id number] Admit date:  10/27/2011  Clinical Social Worker:  Jacelyn Grip  Date/time:  11/01/2011 05:00 PM  Clinical Social Work is seeking post-discharge placement for this patient at the following level of care:   SKILLED NURSING   (*CSW will update this form in Epic as items are completed)   11/01/2011  Patient/family provided with [facility] Department of Clinical Social Work's list of facilities offering this level of care within the geographic area requested by the patient (or if unable, by the patient's family).  11/01/2011  Patient/family informed of their freedom to choose among providers that offer the needed level of care, that participate in Medicare, Medicaid or managed care program needed by the patient, have an available bed and are willing to accept the patient.  11/01/2011  Patient/family informed of [facility]' ownership interest in [facility], as well as of the fact that they are under no obligation to receive care at this facility.  PASARR submitted to EDS on 11/01/2011 PASARR number received from EDS on 11/01/2011  FL2 transmitted to all facilities in geographic area requested by pt/family on  11/02/2011 FL2 transmitted to all facilities within larger geographic area on   Patient informed that his/her managed care company has contracts with or will negotiate with  certain facilities, including the following:     Patient/family informed of bed offers received:  11/04/2011 Patient chooses bed at [facility]  Physician recommends and patient chooses bed at  SNF  Patient to be transferred to [facility] at 11/08/2011 Patient to be transferred to facility by [facility]  The following physician request were entered in Epic:   Additional Comments: Initiated SNF search to [facility] and [facility].   Jacklynn Lewis, MSW,  LCSWA  Clinical Social Work [phone]

## 2011-11-02 NOTE — Progress Notes (Signed)
Clinical Social Worker attempted to meet with pt to provide bed offers. Pt sleeping soundly at this time. Clinical Social Worker to follow up with pt to provide bed offers. Clinical Social Worker to continue to follow for SNF placement.   Jacklynn Lewis, MSW, LCSWA  Clinical Social Work [phone]

## 2011-11-02 NOTE — Progress Notes (Signed)
Physical medicine and rehabilitation consult requested. Patient was admitted from skilled nursing facility [facility]. Wife cannot provide the appropriate physical assistance for patient to be discharged to home. Social work is involved with plans to explore other skilled nursing facilities in the area to be closer to family. Patient is accepting of need to return back to skilled nursing facility due to limited disposition plan. Will hold on formal rehabilitation consult at this time and advise skilled nursing facility placement. Please call for any further questions

## 2011-11-02 NOTE — Progress Notes (Signed)
Physical Therapy Treatment Patient Details Name: [name] MRN: [id number] DOB: Jan 18, 1941 Today's Date: 11/02/2011 Time: 1110-1208 PT Time Calculation (min): 58 min  PT Assessment / Plan / Recommendation Comments on Treatment Session  Pt initially convinced that he was weaker today because he was in more pain. He actually demonstrated improved strength in all extremities. Pt reported feeling better and moving easier after stretching his trunk at EOB. Very debilitated.    Follow Up Recommendations  Skilled nursing facility;Supervision/Assistance - 24 hour    Barriers to Discharge        Equipment Recommendations  Defer to next venue    Recommendations for Other Services    Frequency Min 4X/week   Plan Discharge plan needs to be updated;Frequency needs to be updated    Precautions / Restrictions Precautions Precautions: Fall;Cervical Required Braces or Orthoses: Cervical Brace Cervical Brace: Hard collar;Other (comment) (at all times) Restrictions Weight Bearing Restrictions: No   Pertinent Vitals/Pain 8/10 neck pain at beginning of session with RN notified and in to give IV morphine; stated he felt "better" at end of session    Mobility  Bed Mobility Bed Mobility: Rolling Right;Supine to Sit;Sit to Supine;Sitting - Scoot to Edge of Bed Rolling Right: 1: +2 Total assist Rolling Right: Patient Percentage: 10% Supine to Sit: 1: +2 Total assist Supine to Sit: Patient Percentage: 30% Sitting - Scoot to Edge of Bed: 1: +1 Total assist Sit to Supine: 1: +2 Total assist Sit to Supine: Patient Percentage: 0% Details for Bed Mobility Assistance: pt very uncomfortable on arrival with HOB 30 degrees, so allowed pt to come up from this position; pt able to move bil legs towards Lt EOB and used abd muscles to assist with pulling to sitting;  Transfers Transfers: Sit to Stand;Stand to Sit Sit to Stand: 1: +2 Total assist;From elevated surface;Without upper extremity assist;Other  (comment) (from ICU bed at lowest height with seat deflated) Sit to Stand: Patient Percentage: 40% Stand to Sit: 1: +2 Total assist;Without upper extremity assist;To elevated surface;Other (comment) (ICU bed) Stand to Sit: Patient Percentage: 30% Details for Transfer Assistance: deferred OOB to chair due to pt's extreme discomfort yesterday and sacral decubitus; pt also with poor trunk control/support with "collapsing of ribcage on pelvis" in sitting with reports of difficulty breathing in this position. Pt stood with use of pad under his pelvis; good use of LLE with incr strength compared to 7/30 Ambulation/Gait Ambulation/Gait Assistance: Not tested (comment)    Exercises General Exercises - Lower Extremity Heel Slides: AROM;AAROM;Both;Other reps (comment);Supine (RLE AAROM flex; min resist extension; LLE AROM flex; 4/5 ext)   PT Diagnosis:    PT Problem List:   PT Treatment Interventions:     PT Goals Acute Rehab PT Goals Pt will go Supine/Side to Sit: with mod assist PT Goal: Supine/Side to Sit - Progress: Progressing toward goal Pt will go Sit to Stand: with mod assist PT Goal: Sit to Stand - Progress: Progressing toward goal Pt will go Stand to Sit: with mod assist PT Goal: Stand to Sit - Progress: Progressing toward goal  Visit Information  Last PT Received On: 11/02/11 Assistance Needed: +2 PT/OT Co-Evaluation/Treatment: Yes    Subjective Data  Subjective: Pt reporting incr neck pain due to difficulty with positioning on/off a bedpan with nursing. Patient Stated Goal: to return home   Cognition  Overall Cognitive Status: Appears within functional limits for tasks assessed/performed Arousal/Alertness: Awake/alert Orientation Level: Appears intact for tasks assessed Behavior During Session: Other (comment) (easily  aggravated initially and then more agreeable)    Balance  Static Sitting Balance Static Sitting - Balance Support: No upper extremity supported;Feet supported  (EOB with air mattress max inflate and feet on stool) Static Sitting - Level of Assistance: 3: Mod assist Static Sitting - Comment/# of Minutes: EOB ~15 minutes with 2 person assist working on upright trunk and head position; pt required assist at anterior chest and lumbar spine/pelvis to extend into upright position, pt then able to correct head position himself to midline/neutral (tends to keep laterally flexed to the Rt)  End of Session PT - End of Session Equipment Utilized During Treatment: Gait belt;Cervical collar Activity Tolerance: Patient limited by fatigue Patient left: in bed;with call bell/phone within reach Nurse Communication: Mobility status;Other (comment) (limit sitting upright due to sacral sore)   GP     Yassmin Binegar 11/02/2011, 2:57 PM

## 2011-11-02 NOTE — Progress Notes (Signed)
Patient ID: [name], male   DOB: 06-Jun-1940, 71 y.o.   MRN: [id number] Subjective:  The patient is alert and pleasant. He complains of generalized soreness.  Objective: Vital signs in last 24 hours: Temp:  [97.8 F (36.6 C)-98.5 F (36.9 C)] 97.8 F (36.6 C) (07/31 0800) Pulse Rate:  [71-88] 76  (07/31 0800) Resp:  [4-20] 4  (07/31 0800) BP: (119-152)/(45-68) 128/61 mmHg (07/31 0800) SpO2:  [95 %-100 %] 100 % (07/31 0800)  Intake/Output from previous day: 07/30 0701 - 07/31 0700 In: 2400 [P.O.:1200; I.V.:1200] Out: 1425 [Urine:1425] Intake/Output this shift: Total I/O In: 75 [I.V.:75] Out: 450 [Urine:450]  Physical exam the patient is alert and oriented. His hand grip strength remains at 4/5 bilaterally which has improved since surgery.  The patient's dressing is clean and dry without evidence of hematoma or shift.  Lab Results:  Basename 11/01/11 0342 10/31/11 0521  WBC 8.6 6.2  HGB 11.5* 10.4*  HCT 35.9* 32.7*  PLT 228 237   BMET  Basename 11/01/11 0342  NA 135  K 4.7  CL 100  CO2 27  GLUCOSE 236*  BUN 26*  CREATININE 0.83  CALCIUM 9.9    Studies/Results: Dg Cervical Spine 2-3 Views  10/31/2011  z*RADIOLOGY REPORT*  Clinical Data: ACDF at C4-5, C5-6.  CERVICAL SPINE - 2-3 VIEW  Comparison: CT of the brain 10/27/2011  Findings: The patient has undergone anterior plate fusion of C4-C6. Detail below C6 is limited.  A second image shows intraoperative localization at C4-5. Large anterior osteophytes identified at C3- 4.  IMPRESSION:  1.  Intraoperative localization. 2.  Anterior plate fusion of C4-C6.  Original Report Authenticated By: Patterson Hammersmith, M.[name].    Assessment/Plan: Postop day #2: The patient has improved neurologically since surgery. He will need PT and OT and likely rehabilitation.  LOS: 6 days     [name] 11/02/2011, 9:53 AM

## 2011-11-02 NOTE — Progress Notes (Signed)
Occupational Therapy Treatment Patient Details Name: [name] MRN: [id number] DOB: 05/21/1940 Today's Date: 11/02/2011 Time: 1115-1209 OT Time Calculation (min): 54 min  OT Assessment / Plan / Recommendation Comments on Treatment Session Pt. demonstrates minimally improved strength bil. UEs, improved bed mobility, and improved trunk control today    Follow Up Recommendations  Skilled nursing facility    Barriers to Discharge       Equipment Recommendations  Defer to next venue    Recommendations for Other Services    Frequency Min 2X/week   Plan Discharge plan remains appropriate    Precautions / Restrictions Precautions Precautions: Fall;Cervical Required Braces or Orthoses: Cervical Brace Cervical Brace: Hard collar;Other (comment) Restrictions Weight Bearing Restrictions: No   Pertinent Vitals/Pain     ADL  Transfers/Ambulation Related to ADLs: Total A +2 (pt. ~ )   For sit to stand from bed ADL Comments: Pt. with mulitple complaints.  Attempted to re-direct pt. to things that are positive i.e. improved UE movement, improved trunk and UE movement.  Pt. initially insisting that he is getting worse, but eventually conceded that he is indeed improving.  Pt. Demonstrates Lt. UE:  shoulder 2-/5; elbow 2/5; fingers 2-/5; Rt. shoulder 2-/5; elbow 3/5; hand 2/5.   Pt. moved to EOB.  While EOB worked on facilitation of trunk extension with facitilation at shoulders, thoracic spine and pelvis.   Pt. with difficulty initiating anterior pelvic tilt and thoracic extension.   Pt. moved sit to stand x 1  (see comments below for details).  Pt. returned to supine and positioned on Rt. sidelying to relieve pressure on buttocks.  Propped pt. to provide gentle positioning to head/neck - pt. tends to hold head/neck in Rt. lateral flexion in the collar.  Pt. required significant amount of time to position    OT Diagnosis:    OT Problem List:   OT Treatment Interventions:     OT  Goals ADL Goals ADL Goal: Additional Goal #2 - Progress: Progressing toward goals  Visit Information  Last OT Received On: 11/02/11 Assistance Needed: +2 PT/OT Co-Evaluation/Treatment: Yes    Subjective Data      Prior Functioning       Cognition  Overall Cognitive Status: Appears within functional limits for tasks assessed/performed Arousal/Alertness: Awake/alert Orientation Level: Appears intact for tasks assessed Behavior During Session: Other (comment) Cognition - Other Comments: Pt. is irritable    Mobility Bed Mobility Bed Mobility: Rolling Left;Supine to Sit;Sit to Supine;Sitting - Scoot to Edge of Bed Rolling Right: 1: +2 Total assist Rolling Right: Patient Percentage: 10% Supine to Sit: 1: +2 Total assist Supine to Sit: Patient Percentage: 30% Sitting - Scoot to Edge of Bed: 1: +1 Total assist Sit to Supine: 1: +2 Total assist Sit to Supine: Patient Percentage: 0% Details for Bed Mobility Assistance: pt very uncomfortable on arrival with HOB 30 degrees, so allowed pt to come up from this position; pt able to move bil legs towards Lt EOB and used abd muscles to assist with pulling to sitting;  Transfers Transfers: Sit to Stand;Stand to Sit Sit to Stand: 1: +2 Total assist;From elevated surface;Without upper extremity assist;Other (comment) (From ICU bed at lowest height with seat deflated) Sit to Stand: Patient Percentage: 40% Stand to Sit: 1: +2 Total assist;Without upper extremity assist;To elevated surface;Other (comment) Stand to Sit: Patient Percentage: 30% Details for Transfer Assistance: deferred OOB to chair due to pt's extreme discomfort yesterday and sacral decubitus; pt also with poor trunk control/support with "collapsing  of ribcage on pelvis" in sitting with reports of difficulty breathing in this position. Pt stood with use of pad under his pelvis; good use of LLE with incr strength compared to 7/30   Exercises General Exercises - Lower Extremity Heel  Slides: AROM;AAROM;Both;Other reps (comment);Supine (RLE AAROM flex; min resist extension; LLE AROM flex; 4/5 ext)  Balance Static Sitting Balance Static Sitting - Balance Support: No upper extremity supported;Feet supported (EOB with air matress max inflated and feet on stool) Static Sitting - Level of Assistance: 3: Mod assist Static Sitting - Comment/# of Minutes: EOB ~15 minutes with 2 person assist working on upright trunk and head position; pt required assist at anterior chest and lumbar spine/pelvis to extend into upright position, pt then able to correct head position himself to midline/neutral (tends to keep laterally flexed to the Rt)  End of Session OT - End of Session Equipment Utilized During Treatment: Cervical collar Activity Tolerance: Patient limited by pain Patient left: with call bell/phone within reach;in bed Nurse Communication: Patient requests pain meds  GO     Ola Fawver M 11/02/2011, 6:04 PM

## 2011-11-03 MED ORDER — CEFAZOLIN SODIUM-DEXTROSE 2-3 GM-% IV SOLR
2.0000 g | INTRAVENOUS | Status: DC
  Filled 2011-11-03: qty 50

## 2011-11-03 NOTE — Progress Notes (Signed)
Clinical Social Worker met with pt at bedside to provide bed offers. Clinical Social Worker provided bed offers and clarified pt questions. Pt reports being in pain currently as pt just completed PT treatment. Pt provided permission to contact pt wife via telephone to discuss bed offers. Clinical Social Worker contacted pt wife via telephone and provided bed offers. Clinical Social Worker encouraged pt and pt wife to discuss bed offers and determine options that pt and pt wife would be agreeable to pt going to. Clinical Social Worker to follow up with pt and pt wife in regard decision  For SNF. Clinical Social Worker to facilitate pt discharge needs when pt medically ready for discharge.  Jacklynn Lewis, MSW, LCSWA  Clinical Social Work [phone]

## 2011-11-03 NOTE — Progress Notes (Signed)
11/03/11 1115  PT Visit Information  Last PT Received On 11/03/11  Assistance Needed +2  PT Time Calculation  PT Start Time 1143  PT Stop Time 1155  PT Time Calculation (min) 12 min  Precautions  Precautions Fall;Cervical  Required Braces or Orthoses Cervical Brace  Cervical Brace Hard collar;Other (comment)  Cognition  Overall Cognitive Status Appears within functional limits for tasks assessed/performed  Arousal/Alertness Awake/alert  Orientation Level Appears intact for tasks assessed  Bed Mobility  Bed Mobility Sit to Supine  Rolling Right: Patient Percentage 10%  Sit to Supine 1: +2 Total assist  Sit to Supine: Patient Percentage 10%  Details for Bed Mobility Assistance vc's for technique, ways to integrate his UE's; truncal assist  Transfers  Transfers Sit to Stand;Stand to Sit;Stand Pivot Transfers  Sit to Stand 1: +2 Total assist;With upper extremity assist;From bed  Sit to Stand: Patient Percentage 40%  Stand to Sit 1: +2 Total assist;Without upper extremity assist;To elevated surface;Other (comment)  Stand to Sit: Patient Percentage 30%  Stand Pivot Transfers 1: +2 Total assist  Stand Pivot Transfers: Patient Percentage 30%  Details for Transfer Assistance vc's for safe technique; truncal assist  Ambulation/Gait  Ambulation/Gait Assistance Not tested (comment)  PT - End of Session  Patient left in bed;with call bell/phone within reach;Other (comment) (in chair position)  Nurse Communication Mobility status;Other (comment)  PT - Assessment/Plan  PT Plan Discharge plan remains appropriate  PT Frequency Min 4X/week  Follow Up Recommendations Skilled nursing facility;Supervision/Assistance - 24 hour  Equipment Recommended Defer to next venue  Acute Rehab PT Goals  Time For Goal Achievement 11/15/11  Potential to Achieve Goals Good  PT Goal: Sit to Stand - Progress Progressing toward goal  PT Goal: Stand to Sit - Progress Progressing toward goal  PT Transfer Goal:  Bed to Chair/Chair to Bed - Progress Progressing toward goal  PT General Charges  $$ ACUTE PT VISIT 1 Procedure  PT Treatments  $Therapeutic Activity 8-22 mins   11/03/2011  [place] Bing, PT [phone] (pager)

## 2011-11-03 NOTE — Progress Notes (Signed)
Physical Therapy Treatment Patient Details Name: [name] MRN: [id number] DOB: Apr 14, 1940 Today's Date: 11/03/2011 Time: 1478-2956 PT Time Calculation (min): 40 min  PT Assessment / Plan / Recommendation Comments on Treatment Session  Pt with notable improvements in lower trunk though still significantly weak    Follow Up Recommendations  Skilled nursing facility;Supervision/Assistance - 24 hour    Barriers to Discharge        Equipment Recommendations  Defer to next venue    Recommendations for Other Services    Frequency Min 4X/week   Plan Discharge plan remains appropriate    Precautions / Restrictions Precautions Precautions: Fall;Cervical Required Braces or Orthoses: Cervical Brace Cervical Brace: Hard collar;Other (comment)   Pertinent Vitals/Pain     Mobility  Bed Mobility Bed Mobility: Supine to Sit;Sitting - Scoot to [place] of Bed;Sit to Supine Supine to Sit: 1: +2 Total assist;HOB flat Supine to Sit: Patient Percentage: 30% Sitting - Scoot to Edge of Bed: 2: Max assist Sit to Supine: 1: +2 Total assist Sit to Supine: Patient Percentage: 10% Details for Bed Mobility Assistance: vc's for technique, ways to integrate his UE's; truncal assist Transfers Transfers: Sit to Stand;Stand to Sit;Stand Pivot Transfers Sit to Stand: 1: +2 Total assist;With upper extremity assist;From bed Sit to Stand: Patient Percentage: 40% Stand to Sit: 1: +2 Total assist;Without upper extremity assist;To elevated surface;Other (comment) Stand to Sit: Patient Percentage: 30% Stand Pivot Transfers: 1: +2 Total assist Stand Pivot Transfers: Patient Percentage: 20% (to 30%) Details for Transfer Assistance: tried chair today since pt stated he did not hurt significantly; vc's for technique/safety; truncal assist and support Ambulation/Gait Ambulation/Gait Assistance: Not tested (comment)    Exercises General Exercises - Lower Extremity Ankle Circles/Pumps: 5 reps;Both;AROM Quad  Sets: AROM;10 reps;Supine;Both Gluteal Sets: AROM;Both;10 reps;Supine Heel Slides: AROM;Strengthening;Both;10 reps;Supine   PT Diagnosis:    PT Problem List:   PT Treatment Interventions:     PT Goals Acute Rehab PT Goals PT Goal Formulation: With patient/family Time For Goal Achievement: 11/15/11 Potential to Achieve Goals: Good PT Goal: Supine/Side to Sit - Progress: Progressing toward goal PT Goal: Sit to Stand - Progress: Progressing toward goal PT Goal: Stand to Sit - Progress: Progressing toward goal PT Transfer Goal: Bed to Chair/Chair to Bed - Progress: Progressing toward goal PT Goal: Ambulate - Progress: Not progressing PT Goal: Up/Down Stairs - Progress: Not progressing  Visit Information  Last PT Received On: 11/03/11 Assistance Needed: +2    Subjective Data  Subjective: pain in the neck and butt in sitting   Cognition  Overall Cognitive Status: Appears within functional limits for tasks assessed/performed Arousal/Alertness: Awake/alert Orientation Level: Appears intact for tasks assessed Cognition - Other Comments: easily irritated    Balance  Static Sitting Balance Static Sitting - Balance Support: No upper extremity supported Static Sitting - Level of Assistance: 3: Mod assist Static Sitting - Comment/# of Minutes: EOB> 15 min, needing lower truncal support to pull into Ant. pelvic tilt.  pt could initiate upper trunck ext, but not atttain full erect posture without sternal support Static Standing Balance Static Standing - Balance Support: Bilateral upper extremity supported;During functional activity Static Standing - Level of Assistance: 1: +2 Total assist;Patient percentage (comment) (pt  40%) Static Standing - Comment/# of Minutes: pt able to pull into full pelvic/hip extension with truncal assist while maintaining bil knee ext.  End of Session PT - End of Session Equipment Utilized During Treatment: Gait belt;Cervical collar Activity Tolerance: Patient  tolerated treatment well;Patient  limited by fatigue Patient left: in chair;with call bell/phone within reach Nurse Communication: Mobility status;Other (comment)   GP     Remberto Lienhard, Eliseo Gum 11/03/2011, 11:15 AM  11/03/2011  [place] Bing, PT [phone] (pager)

## 2011-11-03 NOTE — Progress Notes (Signed)
Patient ID: [name], male   DOB: [birth year], 71 y.o.   MRN: [id number] Subjective:  The patient is alert and pleasant. He looks well.  Objective: Vital signs in last 24 hours: Temp:  [97.7 F (36.5 C)-98.7 F (37.1 C)] 97.8 F (36.6 C) (08/01 0800) Pulse Rate:  [77-95] 95  (08/01 1000) Resp:  [10-21] 18  (08/01 1000) BP: (97-162)/(45-80) 159/72 mmHg (08/01 1000) SpO2:  [95 %-100 %] 100 % (08/01 1000) Weight:  [82.1 kg (181 lb)] 82.1 kg (181 lb) (08/01 0000)  Intake/Output from previous day: 07/31 0701 - 08/01 0700 In: 3490 [P.O.:1680; I.V.:1810] Out: 4350 [Urine:4350] Intake/Output this shift: Total I/O In: 225 [I.V.:225] Out: 500 [Urine:500]  Physical exam the patient is alert and oriented. His dressing is clean and dry. There is no evidence of hematoma or shift. His hand grip strength is 4/5 bilaterally his lower strumming strength has not changed.  Lab Results:  [facility] 11/01/11 0342  WBC 8.6  HGB 11.5*  HCT 35.9*  PLT 228   BMET  Basename 11/01/11 0342  NA 135  K 4.7  CL 100  CO2 27  GLUCOSE 236*  BUN 26*  CREATININE 0.83  CALCIUM 9.9    Studies/Results: No results found.  Assessment/Plan: Postop day #3: The patient is doing well. I will transfer him to the floor. He will likely need inpatient rehabilitation.  LOS: 7 days     Schwanda Zima D 11/03/2011, 10:51 AM

## 2011-11-03 NOTE — Progress Notes (Signed)
Occupational Therapy Treatment Patient Details Name: SHAHIL SPEEGLE MRN: [id number] DOB: 1941/03/28 Today's Date: 11/03/2011 Time: 1478-2956 OT Time Calculation (min): 21 min  OT Assessment / Plan / Recommendation Comments on Treatment Session Pt. performed all exercises supine with HOB elevated.  Pt. fatigues easily.  Encouraged pt. to perform elbow flexion and extension, and finger flexion and ext, but pt. states he won't remember.  He states he will need reminders posted on wall.  Will create an HEP that can be posted on wall next visit.    Follow Up Recommendations  Skilled nursing facility    Barriers to Discharge       Equipment Recommendations  Defer to next venue    Recommendations for Other Services    Frequency Min 2X/week   Plan Discharge plan remains appropriate    Precautions / Restrictions Precautions Precautions: Fall;Cervical Required Braces or Orthoses: Cervical Brace Cervical Brace: Hard collar;Other (comment) Restrictions Weight Bearing Restrictions: No   Pertinent Vitals/Pain     ADL  ADL Comments: Discussed with pt, that he is almost ready to start working on self feeding.  Pt. seems somewhat resistant to doing so    OT Diagnosis:    OT Problem List:   OT Treatment Interventions:     OT Goals ADL Goals ADL Goal: Additional Goal #1 - Progress: Progressing toward goals  Visit Information  Last OT Received On: 11/03/11 Assistance Needed: +2    Subjective Data      Prior Functioning       Cognition  Overall Cognitive Status: Appears within functional limits for tasks assessed/performed Arousal/Alertness: Awake/alert Orientation Level: Appears intact for tasks assessed Behavior During Session: Other (comment) Cognition - Other Comments: irritable    Mobility Bed Mobility Bed Mobility: Sit to Supine Rolling Right: 1: +2 Total assist Rolling Right: Patient Percentage: 10% Supine to Sit: 1: +2 Total assist;HOB flat Supine to Sit: Patient  Percentage: 30% Sitting - Scoot to Edge of Bed: 2: Max assist Sit to Supine: 1: +2 Total assist Sit to Supine: Patient Percentage: 10% Details for Bed Mobility Assistance: vc's for technique, ways to integrate his UE's; truncal assist Transfers Sit to Stand: 1: +2 Total assist;With upper extremity assist;From bed Sit to Stand: Patient Percentage: 40% Stand to Sit: 1: +2 Total assist;Without upper extremity assist;To elevated surface;Other (comment) Stand to Sit: Patient Percentage: 30% Details for Transfer Assistance: tried chair today since pt stated he did not hurt significantly; vc's for technique/safety; truncal assist and support   Exercises General Exercises - Upper Extremity Shoulder Flexion: AAROM;Right;Left;10 reps;Supine Shoulder Extension: AAROM;Left;Both;10 reps Shoulder ABduction: AAROM;Right;Left;10 reps;Supine Shoulder ADduction: AAROM;Right;Left;10 reps;Supine Elbow Flexion: AAROM;AROM;Right;Left;10 reps;Supine Elbow Extension: AROM;AAROM;Right;Left;10 reps;Supine Wrist Flexion: AROM;AAROM;Right;Left;10 reps;Supine Wrist Extension: AROM;AAROM;Right;Left;10 reps;Supine Digit Composite Flexion: AROM;AAROM;Right;Left;10 reps;Supine Composite Extension: AROM;AAROM;Right;Left;10 reps;Supine General Exercises - Lower Extremity Ankle Circles/Pumps: 5 reps;Both;AROM Quad Sets: AROM;10 reps;Supine;Both Gluteal Sets: AROM;Both;10 reps;Supine Heel Slides: AROM;Strengthening;Both;10 reps;Supine Hand Exercises Forearm Supination: AROM;AAROM;Right;Left;10 reps;Supine Forearm Pronation: AROM;Right;Left;10 reps;Supine Opposition: AROM;Right;Left;10 reps;Supine  Balance Static Sitting Balance Static Sitting - Balance Support: No upper extremity supported Static Sitting - Level of Assistance: 3: Mod assist Static Sitting - Comment/# of Minutes: EOB> 15 min, needing lower truncal support to pull into Ant. pelvic tilt.  pt could initiate upper trunck ext, but not atttain full erect  posture without sternal support Static Standing Balance Static Standing - Balance Support: Bilateral upper extremity supported;During functional activity Static Standing - Level of Assistance: 1: +2 Total assist;Patient percentage (comment) (pt  40%) Static Standing -  Comment/# of Minutes: pt able to pull into full pelvic/hip extension with truncal assist while maintaining bil knee ext.  End of Session OT - End of Session Equipment Utilized During Treatment: Cervical collar Activity Tolerance: Patient limited by fatigue Patient left: with call bell/phone within reach;in bed Nurse Communication: Patient requests pain meds  GO     Soren Lazarz, Ursula Alert M 11/03/2011, 12:25 PM

## 2011-11-04 LAB — GLUCOSE, CAPILLARY
Glucose-Capillary: 168 mg/dL — ABNORMAL HIGH (ref 70–99)
Glucose-Capillary: 186 mg/dL — ABNORMAL HIGH (ref 70–99)

## 2011-11-04 NOTE — Progress Notes (Signed)
Physical Therapy Treatment Patient Details Name: [name] MRN: [id number] DOB: [birth year] Today's Date: 11/04/2011 Time: 4696-2952 PT Time Calculation (min): 35 min  PT Assessment / Plan / Recommendation Comments on Treatment Session  Making slow but steady progress. Good participation today.     Follow Up Recommendations  Skilled nursing facility    Barriers to Discharge        Equipment Recommendations  Defer to next venue    Recommendations for Other Services    Frequency     Plan Discharge plan remains appropriate    Precautions / Restrictions Precautions Precautions: Fall;Cervical Required Braces or Orthoses: Cervical Brace Cervical Brace: Hard collar       Mobility  Bed Mobility Bed Mobility: Rolling Right;Right Sidelying to Sit Rolling Right: 1: +2 Total assist Rolling Right: Patient Percentage: 30% Right Sidelying to Sit: 1: +2 Total assist Right Sidelying to Sit: Patient Percentage: 30% Details for Bed Mobility Assistance: cues for sequencing, assist to flex knees in prep for rolling, facilitation for truncal assist, assist to reach accross midline with LUE Transfers Sit to Stand: 1: +2 Total assist;With upper extremity assist;From bed;From elevated surface Sit to Stand: Patient Percentage: 40% Stand to Sit: 1: +2 Total assist;To bed;To elevated surface;With upper extremity assist Stand to Sit: Patient Percentage: 40% Stand Pivot Transfers: 1: +2 Total assist Stand Pivot Transfers: Patient Percentage: 30% Details for Transfer Assistance: assist for hand placement in prep to push specifically RUE, pt initiating sit->stand but with significant posterior lean needing facilitation a sacrum and shoulder for trunk/spinal extension for more upright standing; pt needing facilitation at hips to pivot to chair Ambulation/Gait Ambulation/Gait Assistance: Not tested (comment)    Exercises General Exercises - Lower Extremity Ankle Circles/Pumps: AROM;Both;10  reps;Supine Short Arc Quad: AROM;Both;10 reps;Supine Heel Slides: AROM;Both;10 reps;Supine     PT Goals Acute Rehab PT Goals PT Goal: Supine/Side to Sit - Progress: Progressing toward goal PT Goal: Sit to Stand - Progress: Progressing toward goal PT Goal: Stand to Sit - Progress: Progressing toward goal PT Transfer Goal: Bed to Chair/Chair to Bed - Progress: Progressing toward goal PT Goal: Ambulate - Progress: Not progressing PT Goal: Up/Down Stairs - Progress: Not progressing  Visit Information  Last PT Received On: 11/04/11 Assistance Needed: +2    Subjective Data      Cognition  Overall Cognitive Status: Appears within functional limits for tasks assessed/performed Arousal/Alertness: Awake/alert Orientation Level: Appears intact for tasks assessed Behavior During Session: [facility] for tasks performed Cognition - Other Comments: more agreeable today, needs cues for attention to tasks    Balance  Static Sitting Balance Static Sitting - Balance Support: No upper extremity supported Static Sitting - Level of Assistance: 3: Mod assist Static Sitting - Comment/# of Minutes: EOB x 10 min, facilitation for ant pelvic til, bilateral facilitation upper trunk for extension (again able to initiate upper trunk extension however falling posterior needing assist to bring weight anteriorly); favors lateral flexion left Dynamic Sitting Balance Dynamic Sitting - Balance Support: No upper extremity supported Dynamic Sitting - Balance Activities: Lateral lean/weight shifting  End of Session PT - End of Session Equipment Utilized During Treatment: Gait belt Activity Tolerance: Patient tolerated treatment well;Patient limited by fatigue Patient left: in chair;with call bell/phone within reach Nurse Communication: Mobility status   GP     [facility] [name] 11/04/2011, 2:38 PM

## 2011-11-04 NOTE — Progress Notes (Signed)
Patient ID: [name], male   DOB: May 20, 1940, 71 y.o.   MRN: [id number] Subjective:  The patient is alert and pleasant. He has no complaints.  Objective: Vital signs in last 24 hours: Temp:  [98.2 F (36.8 C)-98.6 F (37 C)] 98.2 F (36.8 C) (08/02 0603) Pulse Rate:  [83-95] 84  (08/02 0603) Resp:  [12-18] 18  (08/02 0603) BP: (96-164)/(43-81) 158/74 mmHg (08/02 0603) SpO2:  [98 %-100 %] 100 % (08/02 0603)  Intake/Output from previous day: 08/01 0701 - 08/02 0700 In: 1290 [P.O.:240; I.V.:1050] Out: 1975 [Urine:1975] Intake/Output this shift:    Physical exam the patient is alert and oriented. He remains quadriparetic but has improved since surgery. His incision is healing well without signs of infection, hematoma, or shift.  Lab Results: No results found for this basename: WBC:2,HGB:2,HCT:2,PLT:2 in the last 72 hours BMET No results found for this basename: NA:2,K:2,CL:2,CO2:2,GLUCOSE:2,BUN:2,CREATININE:2,CALCIUM:2 in the last 72 hours  Studies/Results: No results found.  Assessment/Plan: Postop day #4: The patient has improved neurologically but is still quadriparetic. He will need rehabilitation.  LOS: 8 days     [name] D 11/04/2011, 8:01 AM

## 2011-11-04 NOTE — Progress Notes (Signed)
RN was notified of abnormal BP  

## 2011-11-04 NOTE — Progress Notes (Signed)
Occupational Therapy Treatment Patient Details Name: [name] MRN: [id number] DOB: 1940/04/26 Today's Date: 11/04/2011 Time: 1320-1350 OT Time Calculation (min): 30 min  OT Assessment / Plan / Recommendation Comments on Treatment Session Focus of session on sitting balance today, Pt more agreeable to therapy today.    Follow Up Recommendations  Skilled nursing facility    Barriers to Discharge       Equipment Recommendations  Defer to next venue    Recommendations for Other Services    Frequency Min 2X/week   Plan Discharge plan remains appropriate    Precautions / Restrictions Precautions Precautions: Fall;Cervical Required Braces or Orthoses: Cervical Brace Cervical Brace: Hard collar   Pertinent Vitals/Pain See vitals    ADL  Toilet Transfer: Simulated;+2 Total assistance Toilet Transfer: Patient Percentage: 30% Toilet Transfer Method: Stand pivot Toilet Transfer Equipment: Other (comment) (EOB to chair) Equipment Used: Gait belt;Other (comment) (cervical collar) Transfers/Ambulation Related to ADLs: +2 assist for stand pivot from EOB to chair ADL Comments: Focus of session sitting balance EOB as precursor for ADLs and functional transfers.  Pt with 3+/5-4/5 in left elbow and nearly 3+/5 in right elbow,    OT Diagnosis:    OT Problem List:   OT Treatment Interventions:     OT Goals ADL Goals Pt Will Transfer to Toilet: Stand pivot transfer;with max assist;3-in-1 ADL Goal: Toilet Transfer - Progress: Progressing toward goals Additional ADL Goal #1: Pt. will be independent with HEP for bil UE AROM ADL Goal: Additional Goal #1 - Progress: Progressing toward goals Additional ADL Goal #2: Pt. will be sit EOB with min A x 10 mins while performing simple grooming activites with minA for balance ADL Goal: Additional Goal #2 - Progress: Progressing toward goals  Visit Information  Last OT Received On: 11/04/11 Assistance Needed: +2    Subjective Data        Prior Functioning       Cognition  Overall Cognitive Status: Appears within functional limits for tasks assessed/performed Arousal/Alertness: Awake/alert Orientation Level: Appears intact for tasks assessed Behavior During Session: [facility] for tasks performed Cognition - Other Comments: more agreeable today, needs cues for attention to tasks    Mobility Bed Mobility Bed Mobility: Rolling Right;Right Sidelying to Sit Rolling Right: 1: +2 Total assist Rolling Right: Patient Percentage: 30% Right Sidelying to Sit: 1: +2 Total assist Right Sidelying to Sit: Patient Percentage: 30% Details for Bed Mobility Assistance: cues for sequencing, assist to flex knees in prep for rolling, facilitation for truncal assist, assist to reach accross midline with LUE Transfers Sit to Stand: 1: +2 Total assist;With upper extremity assist;From bed;From elevated surface Sit to Stand: Patient Percentage: 40% Stand to Sit: 1: +2 Total assist;To bed;To elevated surface;With upper extremity assist Stand to Sit: Patient Percentage: 40% Details for Transfer Assistance: assist for hand placement in prep to push specifically RUE, pt initiating sit->stand but with significant posterior lean needing facilitation a sacrum and shoulder for trunk/spinal extension for more upright standing; pt needing facilitation at hips to pivot to chair   Exercises General Exercises - Upper Extremity Elbow Flexion: AROM;AAROM;Right;Left;10 reps;Seated Elbow Extension: AROM;AAROM;Right;Left;10 reps;Seated Wrist Flexion: AROM;AAROM;Right;Left;10 reps;Seated Wrist Extension: AROM;AAROM;Right;Left;10 reps;Seated   Balance Static Sitting Balance Static Sitting - Balance Support: No upper extremity supported Static Sitting - Level of Assistance: 3: Mod assist Static Sitting - Comment/# of Minutes: EOB x 10 min, facilitation for ant pelvic til, bilateral facilitation upper trunk for extension (again able to initiate upper trunk extension  however falling  posterior needing assist to bring weight anteriorly); favors lateral flexion left Dynamic Sitting Balance Dynamic Sitting - Balance Support: No upper extremity supported Dynamic Sitting - Balance Activities: Lateral lean/weight shifting  End of Session OT - End of Session Equipment Utilized During Treatment: Cervical collar;Gait belt Activity Tolerance: Patient tolerated treatment well Patient left: with call bell/phone within reach;in bed Nurse Communication: Mobility status  GO    11/04/2011 Cipriano Mile OTR/L Pager [phone] Office [phone]  Cipriano Mile 11/04/2011, 4:23 PM

## 2011-11-04 NOTE — Progress Notes (Signed)
RN notified of abnormal BP

## 2011-11-04 NOTE — Clinical Social Work Note (Signed)
CSW met with pt to provide bed offers. CSW will follow up with family and pt for bed choice. CSW to continue to follow.   Dede Query, MSW, Theresia Majors [phone]

## 2011-11-05 LAB — CBC
HCT: 32.7 % — ABNORMAL LOW (ref 39.0–52.0)
Hemoglobin: 10.4 g/dL — ABNORMAL LOW (ref 13.0–17.0)
RDW: 22.1 % — ABNORMAL HIGH (ref 11.5–15.5)
WBC: 7.5 10*3/uL (ref 4.0–10.5)

## 2011-11-05 LAB — PROTIME-INR: INR: 1.06 (ref 0.00–1.49)

## 2011-11-05 LAB — GLUCOSE, CAPILLARY
Glucose-Capillary: 199 mg/dL — ABNORMAL HIGH (ref 70–99)
Glucose-Capillary: 162 mg/dL — ABNORMAL HIGH (ref 70–99)

## 2011-11-05 MED ORDER — WARFARIN - PHARMACIST DOSING INPATIENT
Freq: Every day | Status: DC
  Administered 2011-11-06: 17:00:00

## 2011-11-05 MED ORDER — PHENYLEPHRINE HCL 1 % NA SOLN
1.0000 [drp] | NASAL | Status: DC | PRN
  Administered 2011-11-05 – 2011-11-07 (×3): 1 [drp] via NASAL
  Filled 2011-11-05: qty 15

## 2011-11-05 MED ORDER — WARFARIN SODIUM 7.5 MG PO TABS
7.5000 mg | ORAL_TABLET | Freq: Once | ORAL | Status: AC
  Administered 2011-11-05: 7.5 mg via ORAL
  Filled 2011-11-05: qty 1

## 2011-11-05 MED ORDER — PHENYLEPHRINE HCL 0.5 % NA SOLN
1.0000 [drp] | NASAL | Status: DC | PRN
  Filled 2011-11-05: qty 15

## 2011-11-05 MED ORDER — PHENYLEPHRINE HCL 1 % NA SOLN
2.0000 [drp] | NASAL | Status: DC | PRN
  Filled 2011-11-05: qty 15

## 2011-11-05 NOTE — Progress Notes (Signed)
Patient is upset with me, patient wants me to use cotton applicators to "dig out his nose".  Spoke with Dr Jordan Likes to get patient's home nasal spray ordered.  Patient called wife to complain about me and cursed at me continually. This Rn has reported off to charge nurse Vanna Scotland.

## 2011-11-05 NOTE — Progress Notes (Signed)
ANTICOAGULATION CONSULT NOTE - Initial Consult  Pharmacy Consult for Warfarin Indication: Hx RUE DVT (July '13)  No Known Allergies  Patient Measurements: Height: 5\' 8"  (172.7 cm) Weight: 181 lb (82.1 kg) IBW/kg (Calculated) : 68.4   Vital Signs: Temp: 98.5 F (36.9 C) (08/03 1350) Temp src: Oral (08/03 1350) BP: 116/50 mmHg (08/03 1350) Pulse Rate: 80  (08/03 1350)  Labs: No results found for this basename: HGB:2,HCT:3,PLT:3,APTT:3,LABPROT:3,INR:3,HEPARINUNFRC:3,CREATININE:3,CKTOTAL:3,CKMB:3,TROPONINI:3 in the last 72 hours  Estimated Creatinine Clearance: 86.6 ml/min (by C-G formula based on Cr of 0.83).   Medical History: Past Medical History  Diagnosis Date  . Diabetes mellitus   . Hypertension   . Hyperlipidemia   . Cervical spinal stenosis   . Lumbar spinal stenosis   . 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 DVT (deep venous thrombosis)     RUE 10/2011  . Anemia     Assessment: 71 y.o. M with history of a recent RUE DVT in July '13. Warfarin was held for cervical disc surgery on 7/29 and bridged with heparin. Heparin was not resumed post-op per Dr. Lovell Sheehan. Today is post-op D#4 and to resume warfarin with no bridging per Dr. Dutch Quint.   PTA the patient was taking 6 mg daily. He has not had any doses this admission. Heparin has been off since 7/28. Hgb/Hct/Plt ok. No bleeding noted at this time.  Goal of Therapy:  INR 2-3   Plan:  1. Warfarin 7.5 mg x 1 dose at 1800 today 2. Daily PT/INR, CBC q72h 3. Will continue to monitor for any signs/symptoms of bleeding and will follow up with PT/INR in the a.m.   Georgina Pillion, PharmD, BCPS Clinical Pharmacist Pager: [phone] 11/05/2011 5:08 PM

## 2011-11-05 NOTE — Progress Notes (Signed)
No new issues. Patient quadriparesis stable. Neck pain controlled. Main complaint is that of nasal congestion.  Wound clean dry and intact. Motor and sensory examination extremities is stable.  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 Patient slowly progressing postoperatively. Plan for rehabilitation versus SNIF.

## 2011-11-06 LAB — GLUCOSE, CAPILLARY
Glucose-Capillary: 239 mg/dL — ABNORMAL HIGH (ref 70–99)
Glucose-Capillary: 195 mg/dL — ABNORMAL HIGH (ref 70–99)

## 2011-11-06 MED ORDER — WARFARIN SODIUM 7.5 MG PO TABS
7.5000 mg | ORAL_TABLET | Freq: Once | ORAL | Status: AC
  Administered 2011-11-06: 7.5 mg via ORAL
  Filled 2011-11-06: qty 1

## 2011-11-06 MED ORDER — WHITE PETROLATUM GEL
Status: AC
  Filled 2011-11-06: qty 5

## 2011-11-06 NOTE — Progress Notes (Signed)
ANTICOAGULATION CONSULT NOTE - Follow Up Consult  Pharmacy Consult for warfarin Indication: Hx RUE DVT (July 2013)  No Known Allergies  Patient Measurements: Height: 5\' 8"  (172.7 cm) Weight: 181 lb (82.1 kg) IBW/kg (Calculated) : 68.4   Vital Signs: Temp: 97.7 F (36.5 C) (08/04 0600) BP: 129/66 mmHg (08/04 0600) Pulse Rate: 78  (08/04 0600)  Labs:  Basename 11/06/11 0615 11/05/11 1749  HGB -- 10.4*  HCT -- 32.7*  PLT -- 234  APTT -- --  LABPROT 14.3 14.0  INR 1.09 1.06  HEPARINUNFRC -- --  CREATININE -- --  CKTOTAL -- --  CKMB -- --  TROPONINI -- --    Estimated Creatinine Clearance: 86.6 ml/min (by C-G formula based on Cr of 0.83).   Medications:  Scheduled:    . captopril  25 mg Oral TID  . docusate sodium  100 mg Oral BID  . fluticasone  2 spray Each Nare Daily  . insulin aspart  0-9 Units Subcutaneous TID WC  . polyethylene glycol  17 g Oral Daily  . senna  2 tablet Oral QHS  . simvastatin  20 mg Oral q1800  . warfarin  7.5 mg Oral Once  . 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 Warfarin therapy was held for cervical disc surgery (surgery on 7/29) and patient was bridged with Lovenox from 7/26-7/28, and was then switched to heparin. Heparin was not resumed post-op per Dr. Lovell Sheehan. Warfarin was restarted yesterday (post-op day #4) per Dr. Dutch Quint with no bridging. Patient was taking warfarin 6mg  daily PTA. Received 7.5mg  last night. INR this morning was 1.09. H/H/Plt ok. No bleeding noted.  Goal of Therapy: INR 2-3 Monitor platelets by anticoagulation protocol: Yes   Plan:  1. Warfarin 7.5mg  po x1 tonight 2. Daily PT/INR 3. Monitor for signs/symptoms of bleeding  Urias Sheek D. Payten Beaumier, PharmD Clinical Pharmacist Pager: [phone] Phone: [phone] 11/06/2011 2:09 PM

## 2011-11-06 NOTE — Progress Notes (Signed)
Patient ID: [name], male   DOB: 02-Nov-1940, 71 y.o.   MRN: [id number] Subjective: Patient reports he's unchanged.   Objective: Vital signs in last 24 hours: Temp:  [97.7 F (36.5 C)-98.6 F (37 C)] 97.7 F (36.5 C) (08/04 0600) Pulse Rate:  [78-88] 78  (08/04 0600) Resp:  [18] 18  (08/04 0600) BP: (116-165)/(50-72) 129/66 mmHg (08/04 0600) SpO2:  [99 %-100 %] 99 % (08/04 0600)  Intake/Output from previous day: 08/03 0701 - 08/04 0700 In: 230 [P.O.:230] Out: 1200 [Urine:1200] Intake/Output this shift: Total I/O In: -  Out: 3000 [Urine:3000]  very weak hands R>L, wiggles toes, incision ok  Lab Results: Lab Results  Component Value Date   WBC 7.5 11/05/2011   HGB 10.4* 11/05/2011   HCT 32.7* 11/05/2011   MCV 83.0 11/05/2011   PLT 234 11/05/2011   Lab Results  Component Value Date   INR 1.09 11/06/2011   BMET Lab Results  Component Value Date   NA 135 11/01/2011   K 4.7 11/01/2011   CL 100 11/01/2011   CO2 27 11/01/2011   GLUCOSE 236* 11/01/2011   BUN 26* 11/01/2011   CREATININE 0.83 11/01/2011   CALCIUM 9.9 11/01/2011    Studies/Results: No results found.  Assessment/Plan: Seems stable. Will need rehab vs snf   LOS: 10 days    [name] S 11/06/2011, 10:03 AM

## 2011-11-06 NOTE — Progress Notes (Signed)
CSW met with pt at bedside and spoke to pt's wife via phone re: SNF choice. Pt and pt's wife have accepted [facility] offer. Weekend CSW left voicemail for admissions at [facility]. Weekday CSW to f/u. Dellie Burns, MSW, LCSWA [phone] (Weekends 8:00am-4:30pm)

## 2011-11-07 LAB — PROTIME-INR
INR: 1.25 (ref 0.00–1.49)
Prothrombin Time: 16 seconds — ABNORMAL HIGH (ref 11.6–15.2)

## 2011-11-07 LAB — GLUCOSE, CAPILLARY: Glucose-Capillary: 173 mg/dL — ABNORMAL HIGH (ref 70–99)

## 2011-11-07 MED ORDER — WARFARIN SODIUM 10 MG PO TABS
10.0000 mg | ORAL_TABLET | Freq: Once | ORAL | Status: AC
  Administered 2011-11-07: 10 mg via ORAL
  Filled 2011-11-07: qty 1

## 2011-11-07 NOTE — Progress Notes (Signed)
Occupational Therapy Treatment Patient Details Name: [name] MRN: [id number] DOB: 1940-05-16 Today's Date: 11/07/2011 Time: 4098-1191 OT Time Calculation (min): 54 min  OT Assessment / Plan / Recommendation Comments on Treatment Session Pt worked hard on self feeding (finger feeding, spoon feeding, and use of cup).  Cooperative with UE exercises.  Requested squeeze ball to work on L hand grip. Pt forgot he had eaten lunch.  Needed redirection for attention to task.    Follow Up Recommendations  Skilled nursing facility    Barriers to Discharge       Equipment Recommendations  Defer to next venue    Recommendations for Other Services    Frequency     Plan Discharge plan remains appropriate    Precautions / Restrictions Precautions Precautions: Cervical;Fall Required Braces or Orthoses: Cervical Brace Cervical Brace: Hard collar Restrictions Weight Bearing Restrictions: No   Pertinent Vitals/Pain     ADL  Eating/Feeding: Performed;Moderate assistance Where Assessed - Eating/Feeding: Bed level Grooming: Performed;Teeth care;Moderate assistance Where Assessed - Grooming: Supine, head of bed up Equipment Used: Other (comment) (built up spoon, built up toothbrush, weighted mug with lid) ADL Comments: Pt not yet able to self feed, but addressed as therapeutic activity using AE.  Pt able to finger feed graham crackers once placed, but repeatedly dropped items unless visually attending.  Needed moderate verbal cues to stay on task.  Distracted by need to swab nose/use nasal spray.    OT Diagnosis:    OT Problem List:   OT Treatment Interventions:     OT Goals ADL Goals Pt Will Perform Eating: with mod assist;with adaptive utensils;Supported;Supine, head of bed up;Sitting, chair ADL Goal: Eating - Progress: Other (comment) (continue for consistency) Pt Will Perform Grooming: with mod assist;Sitting, chair;Supine, head of bed up;Supported;with adaptive equipment ADL  Goal: Grooming - Progress: Progressing toward goals Additional ADL Goal #1: Pt. will be independent with HEP for bil UE AROM ADL Goal: Additional Goal #1 - Progress: Progressing toward goals  Visit Information  Last OT Received On: 11/07/11 Assistance Needed: +2    Subjective Data      Prior Functioning       Cognition  Overall Cognitive Status: Impaired Area of Impairment: Attention Arousal/Alertness: Awake/alert Orientation Level: Appears intact for tasks assessed Behavior During Session: [facility] for tasks performed Current Attention Level: Sustained Cognition - Other Comments: Pt requires constant verbal cueing for redirection back to task.    Mobility Bed Mobility Bed Mobility: Not assessed   Exercises General Exercises - Upper Extremity Shoulder Flexion: AAROM;Right;Left;10 reps;Supine Shoulder Extension: AAROM;Left;Both;10 reps Shoulder ABduction: AAROM;Right;Left;10 reps;Supine Shoulder ADduction: AAROM;Right;Left;10 reps;Supine Elbow Flexion: AROM;AAROM;Right;Left;10 reps;Seated Elbow Extension: AROM;AAROM;Right;Left;10 reps;Seated Wrist Flexion: AROM;AAROM;Right;Left;10 reps;Seated Wrist Extension: AROM;AAROM;Right;Left;10 reps;Seated Digit Composite Flexion: AROM;AAROM;Right;Left;10 reps;Supine Composite Extension: AROM;AAROM;Right;Left;10 reps;Supine Hand Exercises Forearm Pronation: AROM;Right;Left;10 reps;Supine  Balance   End of Session OT - End of Session Equipment Utilized During Treatment: Cervical collar Activity Tolerance: Patient tolerated treatment well Patient left: in bed;with call bell/phone within reach;with bed alarm set Nurse Communication: Other (comment) (need for nasal spray)  GO     Evern Bio 11/07/2011, 2:19 PM [phone]

## 2011-11-07 NOTE — Progress Notes (Signed)
I have read and agree with the below treatment note and plan. Katiya Fike [name] Whitlow PT, DPT Pager: [phone] 

## 2011-11-07 NOTE — Progress Notes (Signed)
ANTICOAGULATION CONSULT NOTE - Follow Up Consult  Pharmacy Consult for Coumadin Indication: Hx RUE DVT (July '13)  Allergies No Known Allergies  Patient Measurements: Height: 5\' 8"  (172.7 cm) Weight: 181 lb (82.1 kg) IBW/kg (Calculated) : 68.4    Vital Signs: Temp: 97.4 F (36.3 C) (08/05 0600) Temp src: Oral (08/05 0600) BP: 161/69 mmHg (08/05 0600) Pulse Rate: 77  (08/05 0600)  Labs:  Basename 11/07/11 0635 11/06/11 0615 11/05/11 1749  HGB -- -- 10.4*  HCT -- -- 32.7*  PLT -- -- 234  APTT -- -- --  LABPROT 16.0* 14.3 14.0  INR 1.25 1.09 1.06   Lab Results  Component Value Date   INR 1.25 11/07/2011   INR 1.09 11/06/2011   INR 1.06 11/05/2011    Estimated Creatinine Clearance: 86.6 ml/min (by C-G formula based on Cr of 0.83).  Medications:  Prescriptions prior to admission  Medication Sig Dispense Refill  . acarbose (PRECOSE) 25 MG tablet Take 25 mg by mouth 3 (three) times daily with meals.      Marland Kitchen aspirin 81 MG chewable tablet Chew 81 mg by mouth daily.      . captopril (CAPOTEN) 25 MG tablet Take 25 mg by mouth 3 (three) times daily.      . cholecalciferol (VITAMIN D) 1000 UNITS tablet Take 2,000 Units by mouth daily.      . fluticasone (FLONASE) 50 MCG/ACT nasal spray Place 2 sprays into the nose daily.      Marland Kitchen glimepiride (AMARYL) 1 MG tablet Take 1 mg by mouth 2 (two) times daily.      Marland Kitchen HYDROcodone-acetaminophen (NORCO/VICODIN) 5-325 MG per tablet Take 1 tablet by mouth every 4 (four) hours as needed. For pain      . magnesium hydroxide (MILK OF MAGNESIA) 400 MG/5ML suspension Take 30 mLs by mouth daily as needed.      . metFORMIN (GLUCOPHAGE) 1000 MG tablet Take 1,000 mg by mouth 2 (two) times daily with a meal.      . methocarbamol (ROBAXIN) 500 MG tablet Take 500 mg by mouth every 8 (eight) hours as needed. For muscle spasm      . senna (SENOKOT) 8.6 MG TABS Take 1 tablet (8.6 mg total) by mouth 2 (two) times daily.  120 each    . simvastatin (ZOCOR) 20 MG  tablet Take 20 mg by mouth every evening.      . warfarin (COUMADIN) 6 MG tablet Take 6 mg by mouth daily.      Marland Kitchen zolpidem (AMBIEN) 5 MG tablet Take 5 mg by mouth at bedtime as needed. For sleep      . benzoyl peroxide 5 % external liquid Apply topically 2 (two) times daily.  142 g  12   Scheduled:    . captopril  25 mg Oral TID  . docusate sodium  100 mg Oral BID  . fluticasone  2 spray Each Nare Daily  . insulin aspart  0-9 Units Subcutaneous TID WC  . polyethylene glycol  17 g Oral Daily  . senna  2 tablet Oral QHS  . simvastatin  20 mg Oral q1800  . warfarin  7.5 mg Oral ONCE-1800  . Warfarin - Pharmacist Dosing Inpatient   Does not apply q1800  . white petrolatum        Assessment:  71 y/o male with history of recent [July '13] RUE DVT on chronic Coumadin [6 mg daily].  Coumadin was held prior to surgery.  S/P Cervical disc surgery  7 Days Post-Op .  INR after 2 doses Coumadin 1.25.  No Heparin Bridging post-op per neurosurgery.  Goal of Therapy:   INR 2-3   Plan:   Coumadin 10 mg today. Daily INR's.  CBC tomorrow.  Sukaina Toothaker, Elisha Headland, Pharm.D. 11/07/2011, 10:02 AM

## 2011-11-07 NOTE — Progress Notes (Signed)
Physical Therapy Treatment Patient Details Name: [name] MRN: [id number] DOB: Sep 02, 1940 Today's Date: 11/07/2011 Time: 8295-6213 PT Time Calculation (min): 24 min  PT Assessment / Plan / Recommendation Comments on Treatment Session  Pt participated well, requiring constant verbal cueing for redirection back to task. Pt continues to require +2 total assist for all bed mobility.    Follow Up Recommendations  Skilled nursing facility    Barriers to Discharge        Equipment Recommendations  Defer to next venue    Recommendations for Other Services    Frequency Min 4X/week   Plan Discharge plan remains appropriate    Precautions / Restrictions Precautions Precautions: Cervical;Fall Required Braces or Orthoses: Cervical Brace Cervical Brace: Hard collar Restrictions Weight Bearing Restrictions: No       Mobility  Bed Mobility Bed Mobility: Rolling Right;Sit to Sidelying Left;Left Sidelying to Sit;Rolling Left Rolling Right: 1: +2 Total assist Rolling Right: Patient Percentage: 30% Rolling Left: 1: +2 Total assist Rolling Left: Patient Percentage: 40% Left Sidelying to Sit: 1: +2 Total assist Left Sidelying to Sit: Patient Percentage: 20% Sit to Sidelying Left: +2 total assist Sit to Sidelying Left: Patient Percentage: 20% Sitting - Scoot to Edge of Bed: 2: Max assist Details for Bed Mobility Assistance: Pt required cues for sequencing for log roll to side, and facillitation at the trunk and pelvis for rolling. Pt requires max assist with his trunk and bil LE for sidelying to sit. Transfers Details for Transfer Assistance: Pt did not perform any transfers today due to pt complaints of dizziness (RN notified).     PT Goals Acute Rehab PT Goals PT Goal: Supine/Side to Sit - Progress: Progressing toward goal  Visit Information  Last PT Received On: 11/07/11 Assistance Needed: +2    Subjective Data  Subjective: "I think I need to have a BM."   Cognition  Overall Cognitive Status: Impaired Area of Impairment: Attention Arousal/Alertness: Awake/alert Orientation Level: Appears intact for tasks assessed Behavior During Session: [facility] for tasks performed Current Attention Level: Focused Cognition - Other Comments: Pt requires constant verbal cueing for redirection back to task.    Balance  Static Sitting Balance Static Sitting - Balance Support: Feet supported;Left upper extremity supported Static Sitting - Level of Assistance: 3: Mod assist Static Sitting - Comment/# of Minutes: Pt sat EOB with facilitation at pelvis for anterior pelvic tilt and upper trunk for upright posture/trunk extension. Pt require mod assist to maintain upright posture. Pt  tends to  have LOB to left lateral.  End of Session PT - End of Session Equipment Utilized During Treatment: Gait belt;Cervical collar Activity Tolerance: Patient tolerated treatment well Patient left: in bed;with call bell/phone within reach;with bed alarm set Nurse Communication: Mobility status;Patient requests pain meds   GP     [name] 11/07/2011, 1:20 PM

## 2011-11-07 NOTE — Discharge Summary (Signed)
Physician Discharge Summary  Patient ID: [name] MRN: [id number] DOB/AGE: 1940-08-26 71 y.o.  Admit date: 10/27/2011 Discharge date: 11/07/2011  Admission Diagnoses:cervical spondylosis and myelopathy, quadriparesis   Discharge Diagnoses:  Principal Problem:  *Quadriparesis Active Problems:  Diabetes mellitus, type 2  Complex regional pain syndrome of right upper extremity secondary to severe cervical spinal stenosis  DVT of right proximal brachial through the distal axillary vein, acute  Constipation due to pain medication  Cervical stenosis of spine  Spondylosis of cervical joint  Cervical myelopathy   Discharged Condition: fair  Hospital Course: Mr. Davie was admitted to the hospital and underwent a C4-5 and C5-6 Anterior cervical discectomy/decompression; C4-5 and C5-6 interbody arthrodesis with local morcellized autograft bone and Actifuse bone graft extender; insertion of interbody prosthesis at C4-5 and C5-6 (Zimmer peek interbody prosthesis); anterior cervical plating from C4-C6 with globus titanium plate. Post op he has progressed and is ready for rehabilitation. His wound is clean and dry, no signs of infection.   Consults: neurosurgery  Significant Diagnostic Studies: mri cervical spine  Treatments: surgery: C4-5 and C5-6 Anterior cervical discectomy/decompression; C4-5 and C5-6 interbody arthrodesis with local morcellized autograft bone and Actifuse bone graft extender; insertion of interbody prosthesis at C4-5 and C5-6 (Zimmer peek interbody prosthesis); anterior cervical plating from C4-C6 with globus titanium plate   Discharge Exam: Blood pressure 133/66, pulse 78, temperature 97.6 F (36.4 C), temperature source Oral, resp. rate 19, height 5\' 8"  (1.727 m), weight 82.1 kg (181 lb), SpO2 100.00%. Alert oriented x4. Weakness in all extremities. ~3-4/5 in his grips, no change in lower extremities. Wound is clean and dry. Voice strong.  Disposition:  03-Skilled Nursing Facility   Medication List  As of 11/07/2011  5:30 PM   TAKE these medications         acarbose 25 MG tablet   Commonly known as: PRECOSE   Take 25 mg by mouth 3 (three) times daily with meals.      aspirin 81 MG chewable tablet   Chew 81 mg by mouth daily.      benzoyl peroxide 5 % external liquid   Apply topically 2 (two) times daily.      captopril 25 MG tablet   Commonly known as: CAPOTEN   Take 25 mg by mouth 3 (three) times daily.      cholecalciferol 1000 UNITS tablet   Commonly known as: VITAMIN D   Take 2,000 Units by mouth daily.      fluticasone 50 MCG/ACT nasal spray   Commonly known as: FLONASE   Place 2 sprays into the nose daily.      glimepiride 1 MG tablet   Commonly known as: AMARYL   Take 1 mg by mouth 2 (two) times daily.      HYDROcodone-acetaminophen 5-325 MG per tablet   Commonly known as: NORCO/VICODIN   Take 1 tablet by mouth every 4 (four) hours as needed. For pain      magnesium hydroxide 400 MG/5ML suspension   Commonly known as: MILK OF MAGNESIA   Take 30 mLs by mouth daily as needed.      metFORMIN 1000 MG tablet   Commonly known as: GLUCOPHAGE   Take 1,000 mg by mouth 2 (two) times daily with a meal.      methocarbamol 500 MG tablet   Commonly known as: ROBAXIN   Take 500 mg by mouth every 8 (eight) hours as needed. For muscle spasm      senna 8.6 MG  Tabs   Commonly known as: SENOKOT   Take 1 tablet (8.6 mg total) by mouth 2 (two) times daily.      simvastatin 20 MG tablet   Commonly known as: ZOCOR   Take 20 mg by mouth every evening.      warfarin 6 MG tablet   Commonly known as: COUMADIN   Take 6 mg by mouth daily.      zolpidem 5 MG tablet   Commonly known as: AMBIEN   Take 5 mg by mouth at bedtime as needed. For sleep           Follow-up Information    Follow up with Cristi Loron, MD in 1 month. (call to make appt)    Contact information:   [address] [place] [address] [phone]          Signed: Carmela Hurt 11/07/2011, 5:30 PM

## 2011-11-07 NOTE — Clinical Social Work Note (Signed)
Pt has a bed available at [facility]. CSW will facility a discharge to facility when medically ready. CSW will continue to follow.   Dede Query, MSW, Theresia Majors [phone]

## 2011-11-08 LAB — CBC
HCT: 30.9 % — ABNORMAL LOW (ref 39.0–52.0)
RDW: 22.1 % — ABNORMAL HIGH (ref 11.5–15.5)
WBC: 6.5 10*3/uL (ref 4.0–10.5)

## 2011-11-08 LAB — PROTIME-INR
INR: 1.19 (ref 0.00–1.49)
Prothrombin Time: 15.4 seconds — ABNORMAL HIGH (ref 11.6–15.2)

## 2011-11-08 LAB — GLUCOSE, CAPILLARY: Glucose-Capillary: 193 mg/dL — ABNORMAL HIGH (ref 70–99)

## 2011-11-08 NOTE — Progress Notes (Signed)
D/C instructions given to wife. Report called to nurse at facility. Patient left via [facility] to [facility] in no signs of acute distress.

## 2011-11-08 NOTE — Clinical Social Work Note (Signed)
Pt is ready for discharge today to [facility]. Facility has received discharge summary and is ready to accept pt. Pt and wife are agreeable to discharge plan. [facility] will provide discharge. CSW is signing off as no further clinical social work needs identified.   Dede Query, MSW, Theresia Majors [phone]

## 2011-11-17 ENCOUNTER — Emergency Department (HOSPITAL_COMMUNITY): Payer: Medicare Other

## 2011-11-17 ENCOUNTER — Encounter (HOSPITAL_COMMUNITY): Payer: Self-pay | Admitting: *Deleted

## 2011-11-17 ENCOUNTER — Emergency Department (HOSPITAL_COMMUNITY)
Admission: EM | Admit: 2011-11-17 | Discharge: 2011-11-17 | Disposition: A | Discharge status: Skilled Nursing Facility | Payer: Medicare Other | Attending: Emergency Medicine | Admitting: Emergency Medicine

## 2011-11-17 DIAGNOSIS — M48061 Spinal stenosis, lumbar region without neurogenic claudication: Secondary | ICD-10-CM | POA: Insufficient documentation

## 2011-11-17 DIAGNOSIS — W050XXA Fall from non-moving wheelchair, initial encounter: Secondary | ICD-10-CM | POA: Insufficient documentation

## 2011-11-17 DIAGNOSIS — M542 Cervicalgia: Secondary | ICD-10-CM | POA: Insufficient documentation

## 2011-11-17 DIAGNOSIS — W19XXXA Unspecified fall, initial encounter: Secondary | ICD-10-CM

## 2011-11-17 DIAGNOSIS — E119 Type 2 diabetes mellitus without complications: Secondary | ICD-10-CM | POA: Insufficient documentation

## 2011-11-17 DIAGNOSIS — Z79899 Other long term (current) drug therapy: Secondary | ICD-10-CM | POA: Insufficient documentation

## 2011-11-17 DIAGNOSIS — I10 Essential (primary) hypertension: Secondary | ICD-10-CM | POA: Insufficient documentation

## 2011-11-17 DIAGNOSIS — M4802 Spinal stenosis, cervical region: Secondary | ICD-10-CM | POA: Insufficient documentation

## 2011-11-17 DIAGNOSIS — Z96649 Presence of unspecified artificial hip joint: Secondary | ICD-10-CM | POA: Insufficient documentation

## 2011-11-17 DIAGNOSIS — S0990XA Unspecified injury of head, initial encounter: Secondary | ICD-10-CM | POA: Insufficient documentation

## 2011-11-17 DIAGNOSIS — Z86718 Personal history of other venous thrombosis and embolism: Secondary | ICD-10-CM | POA: Insufficient documentation

## 2011-11-17 DIAGNOSIS — E785 Hyperlipidemia, unspecified: Secondary | ICD-10-CM | POA: Insufficient documentation

## 2011-11-17 DIAGNOSIS — Z981 Arthrodesis status: Secondary | ICD-10-CM | POA: Insufficient documentation

## 2011-11-17 DIAGNOSIS — Y9210 Unspecified residential institution as the place of occurrence of the external cause: Secondary | ICD-10-CM | POA: Insufficient documentation

## 2011-11-17 LAB — PROTIME-INR
INR: 1.33 (ref 0.00–1.49)
Prothrombin Time: 16.7 seconds — ABNORMAL HIGH (ref 11.6–15.2)

## 2011-11-17 MED ORDER — ONDANSETRON HCL 4 MG/2ML IJ SOLN
4.0000 mg | Freq: Once | INTRAMUSCULAR | Status: AC
  Administered 2011-11-17: 4 mg via INTRAVENOUS
  Filled 2011-11-17: qty 2

## 2011-11-17 MED ORDER — MORPHINE SULFATE 2 MG/ML IJ SOLN
2.0000 mg | Freq: Once | INTRAMUSCULAR | Status: AC
  Administered 2011-11-17: 2 mg via INTRAVENOUS
  Filled 2011-11-17: qty 1

## 2011-11-17 MED ORDER — MORPHINE SULFATE 2 MG/ML IJ SOLN
2.0000 mg | Freq: Once | INTRAMUSCULAR | Status: AC
  Administered 2011-11-17: 2 mg via INTRAMUSCULAR
  Filled 2011-11-17: qty 1

## 2011-11-17 MED ORDER — SODIUM CHLORIDE 0.9 % IV SOLN
INTRAVENOUS | Status: DC
  Administered 2011-11-17: 22:00:00 via INTRAVENOUS

## 2011-11-17 NOTE — ED Notes (Addendum)
Pt states he fell out of his wheelchair when he was reaching to pick something up. Pt hit on right arm, shoulder, did hit head. Denies any pain or complaints. Denies LOC. No contusions, deformities or swelling noted anywhere.

## 2011-11-17 NOTE — ED Provider Notes (Signed)
History     CSN: [id number]  Arrival date & time 11/17/11  1804   First MD Initiated Contact with Patient 11/17/11 1807      Chief Complaint  Patient presents with  . Fall    (Consider location/radiation/quality/duration/timing/severity/associated sxs/prior treatment) The history is provided by the patient and the nursing home.  Sent from NH, fell from wheelchair, did hit head but no LOC, no specific complaints now. Patient in  c-collar following recent neck surgery and ? Fusion. Denis, HA, new neck pain, chest, abdomen, back, or extremity pain.   Past Medical History  Diagnosis Date  . Diabetes mellitus   . Hypertension   . Hyperlipidemia   . Cervical spinal stenosis   . Lumbar spinal stenosis   . 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 DVT (deep venous thrombosis)     RUE 10/2011  . Anemia     Past Surgical History  Procedure Date  . Total hip arthroplasty   . Toe amputation   . Anterior cervical decomp/discectomy fusion 10/31/2011    Procedure: ANTERIOR CERVICAL DECOMPRESSION/DISCECTOMY FUSION 2 LEVELS;  Surgeon: Cristi Loron, MD;  Location: [facility];  Service: Neurosurgery;  Laterality: N/A;  Cervical Four-Five Cervical Five-Six Anterior Cervical Decompression with fusion interbody prothesis plating and bonegraft    Family History  Problem Relation Age of Onset  . Diabetes Brother     History  Substance Use Topics  . Smoking status: Never Smoker   . Smokeless tobacco: Not on file  . Alcohol Use: No      Review of Systems  Constitutional: Negative for fever.  HENT: Positive for neck pain and neck stiffness.   Eyes: Negative for visual disturbance.  Respiratory: Negative for shortness of breath.   Cardiovascular: Negative for chest pain.  Gastrointestinal: Negative for nausea, vomiting and abdominal pain.  Genitourinary: Negative for hematuria.  Musculoskeletal: Negative for back pain.  Skin: Negative for rash.  Neurological:  Negative for weakness and headaches.  Hematological: Does not bruise/bleed easily.    Allergies  Review of patient's allergies indicates no known allergies.  Home Medications   Current Outpatient Rx  Name Route Sig Dispense Refill  . ACARBOSE 25 MG PO TABS Oral Take 25 mg by mouth 3 (three) times daily with meals.    . ACETAMINOPHEN 325 MG PO TABS Oral Take 650 mg by mouth once. Only for 72 hours. For pain    . ASPIRIN 81 MG PO CHEW Oral Chew 81 mg by mouth daily.    Marland Kitchen BENZOYL PEROXIDE 5 % EX LIQD Topical Apply topically 2 (two) times daily. 142 g 12  . CAPTOPRIL 25 MG PO TABS Oral Take 25 mg by mouth 3 (three) times daily.    Marland Kitchen VITAMIN D 1000 UNITS PO TABS Oral Take 2,000 Units by mouth daily.    Marland Kitchen FLUTICASONE PROPIONATE 50 MCG/ACT NA SUSP Nasal Place 2 sprays into the nose daily.    Marland Kitchen GABAPENTIN 100 MG PO CAPS Oral Take 100 mg by mouth 2 (two) times daily.    Marland Kitchen GLIMEPIRIDE 1 MG PO TABS Oral Take 1 mg by mouth 2 (two) times daily.    Marland Kitchen HYDROCODONE-ACETAMINOPHEN 5-325 MG PO TABS Oral Take 1 tablet by mouth every 4 (four) hours as needed. For pain    . LORAZEPAM 0.5 MG PO TABS Oral Take 0.5 mg by mouth every 6 (six) hours as needed. For anxiety    . MAGNESIUM HYDROXIDE 400 MG/5ML PO SUSP  Oral Take 30 mLs by mouth daily as needed.    Marland Kitchen METFORMIN HCL 1000 MG PO TABS Oral Take 1,000 mg by mouth 2 (two) times daily with a meal.    . METHOCARBAMOL 500 MG PO TABS Oral Take 500 mg by mouth every 8 (eight) hours as needed. For muscle spasm    . RIVAROXABAN 10 MG PO TABS Oral Take 20 mg by mouth daily with supper.    . SENNA 8.6 MG PO TABS Oral Take 1 tablet (8.6 mg total) by mouth 2 (two) times daily. 120 each   . SIMVASTATIN 20 MG PO TABS Oral Take 20 mg by mouth every evening.    Marland Kitchen TRAMADOL HCL 50 MG PO TABS Oral Take 50 mg by mouth 3 (three) times daily.    Marland Kitchen ZOLPIDEM TARTRATE 5 MG PO TABS Oral Take 5 mg by mouth at bedtime as needed. For sleep      BP 143/73  Pulse 87  Temp 97.5 F (36.4  C) (Oral)  Resp 16  SpO2 100%  Physical Exam  Nursing note and vitals reviewed. Constitutional: He is oriented to person, place, and time. He appears well-developed and well-nourished. No distress.  HENT:  Head: Normocephalic and atraumatic.  Mouth/Throat: Oropharynx is clear and moist.       Cervical collar in place.   Eyes: Conjunctivae and EOM are normal. Pupils are equal, round, and reactive to light.  Neck:       Cervical collar in place.  Cardiovascular: Normal rate, regular rhythm and normal heart sounds.   No murmur heard. Pulmonary/Chest: Effort normal and breath sounds normal.  Abdominal: Soft. Bowel sounds are normal. There is no tenderness.  Musculoskeletal: Normal range of motion. He exhibits no tenderness.  Neurological: He is alert and oriented to person, place, and time. No cranial nerve deficit. He exhibits normal muscle tone. Coordination normal.  Skin: Skin is warm. No rash noted.    ED Course  Procedures (including critical care time)  Labs Reviewed  PROTIME-INR - Abnormal; Notable for the following:    Prothrombin Time 16.7 (*)     All other components within normal limits   Ct Head Wo Contrast  11/17/2011  *RADIOLOGY REPORT*  Clinical Data:  Head trauma. Recent cervical fusion.  Fall.  CT HEAD WITHOUT CONTRAST CT CERVICAL SPINE WITHOUT CONTRAST  Technique:  Multidetector CT imaging of the head and cervical spine was performed following the standard protocol without intravenous contrast.  Multiplanar CT image reconstructions of the cervical spine were also generated.  Comparison:  10/26/2008.  CT HEAD  Findings: No mass lesion, mass effect, midline shift, hydrocephalus, hemorrhage.  No territorial ischemia or acute infarction.  Calvarium intact.  Paranasal sinuses appear within normal limits.  Left-sided nasal septal spur.  Mastoid air cells clear.  Intracranial atherosclerosis. Mild soft tissue swelling is present over the left forehead.  No underlying skull  fracture.  IMPRESSION: Negative CT head.  CT CERVICAL SPINE  Findings: Carotid atherosclerosis is present.  Severe cervical spondylosis is present.  C4-C6 ACDF is present.  This appears recent ( 10/31/2011), without solid fusion at these levels. No hardware failure.  Degenerative disease is present at every level, with disc osteophyte complexes at C6-C7 and C7-T1.   No convincing evidence of pseudoarthrosis.  Prominent subchondral geodes are present in the facet joints on the right.  The odontoid is intact. Craniocervical alignment appears within normal limits. Carotid atherosclerosis is present.  There is a levoconvex curvature of the cervical  spine.  No focal fluid collection or hematoma is identified.  Azygos fissure incidentally noted in the right lung. Ankylosis of the right C2-C3 facet joints.  IMPRESSION: 1.  Uncomplicated C4-C6 ACDF. 2.  Severe multilevel cervical spondylosis.  No acute osseous abnormality.  No fracture.  Original Report Authenticated By: Andreas Newport, M.D.   Ct Cervical Spine Wo Contrast  11/17/2011  *RADIOLOGY REPORT*  Clinical Data:  Head trauma. Recent cervical fusion.  Fall.  CT HEAD WITHOUT CONTRAST CT CERVICAL SPINE WITHOUT CONTRAST  Technique:  Multidetector CT imaging of the head and cervical spine was performed following the standard protocol without intravenous contrast.  Multiplanar CT image reconstructions of the cervical spine were also generated.  Comparison:  10/26/2008.  CT HEAD  Findings: No mass lesion, mass effect, midline shift, hydrocephalus, hemorrhage.  No territorial ischemia or acute infarction.  Calvarium intact.  Paranasal sinuses appear within normal limits.  Left-sided nasal septal spur.  Mastoid air cells clear.  Intracranial atherosclerosis. Mild soft tissue swelling is present over the left forehead.  No underlying skull fracture.  IMPRESSION: Negative CT head.  CT CERVICAL SPINE  Findings: Carotid atherosclerosis is present.  Severe cervical spondylosis  is present.  C4-C6 ACDF is present.  This appears recent ( 10/31/2011), without solid fusion at these levels. No hardware failure.  Degenerative disease is present at every level, with disc osteophyte complexes at C6-C7 and C7-T1.   No convincing evidence of pseudoarthrosis.  Prominent subchondral geodes are present in the facet joints on the right.  The odontoid is intact. Craniocervical alignment appears within normal limits. Carotid atherosclerosis is present.  There is a levoconvex curvature of the cervical spine.  No focal fluid collection or hematoma is identified.  Azygos fissure incidentally noted in the right lung. Ankylosis of the right C2-C3 facet joints.  IMPRESSION: 1.  Uncomplicated C4-C6 ACDF. 2.  Severe multilevel cervical spondylosis.  No acute osseous abnormality.  No fracture.  Original Report Authenticated By: Andreas Newport, M.D.     1. Fall       MDM  Patient without complaints but CT scans obtained to ensure no new injury to neck after recent surgical repair, and to ensure nursing home no head injury. We had difficulty in getting patient to go to CT scan, but after some pain meds he eventually went, initially no c/o of pain but with noted that he is on pain meds at [facility] and suspect they wore off.         Shelda Jakes, MD 11/20/11 1728

## 2011-11-17 NOTE — ED Notes (Signed)
Placed call to [facility] for transport back home 

## 2011-11-17 NOTE — ED Notes (Addendum)
Patient returned from CT by CT technologist states refused CT head and neck.  Doctor notified and Doctor and nurse spoke with patient. States his left sciatic nerve is pain full pain 10/10 sharp.

## 2011-11-17 NOTE — ED Notes (Signed)
Pt states his pain "has gone down" and would permit self to be transported back to CT to finish scans.

## 2011-11-17 NOTE — ED Notes (Signed)
Pt transported to CT ?

## 2011-11-17 NOTE — ED Notes (Signed)
The pt  Arrived by [facility] from [facility] after he fell from his w/c.  He was bending over to  Left some from the floor when he fell..  He had neck surgery 3-4 weeks ago and comes in with a c-col;lar in place from the surgery.  No pain no known injury.  He just needs to be checked.  Alert no distress skin warm and dry

## 2012-10-02 HISTORY — PX: CARDIAC CATHETERIZATION: SHX172

## 2012-10-29 ENCOUNTER — Emergency Department (HOSPITAL_COMMUNITY): Payer: Medicare Other

## 2012-10-29 ENCOUNTER — Encounter (HOSPITAL_BASED_OUTPATIENT_CLINIC_OR_DEPARTMENT_OTHER): Payer: Medicare Other | Attending: Plastic Surgery

## 2012-10-29 ENCOUNTER — Inpatient Hospital Stay (HOSPITAL_COMMUNITY)
Admission: EM | Admit: 2012-10-29 | Discharge: 2012-11-09 | DRG: 286 | Disposition: A | Discharge status: Skilled Nursing Facility | Payer: Medicare Other | Attending: Internal Medicine | Admitting: Internal Medicine

## 2012-10-29 ENCOUNTER — Encounter (HOSPITAL_COMMUNITY): Payer: Self-pay | Admitting: Emergency Medicine

## 2012-10-29 DIAGNOSIS — I129 Hypertensive chronic kidney disease with stage 1 through stage 4 chronic kidney disease, or unspecified chronic kidney disease: Secondary | ICD-10-CM | POA: Diagnosis present

## 2012-10-29 DIAGNOSIS — L97909 Non-pressure chronic ulcer of unspecified part of unspecified lower leg with unspecified severity: Secondary | ICD-10-CM | POA: Diagnosis present

## 2012-10-29 DIAGNOSIS — R4182 Altered mental status, unspecified: Secondary | ICD-10-CM

## 2012-10-29 DIAGNOSIS — M47812 Spondylosis without myelopathy or radiculopathy, cervical region: Secondary | ICD-10-CM

## 2012-10-29 DIAGNOSIS — I82A11 Acute embolism and thrombosis of right axillary vein: Secondary | ICD-10-CM

## 2012-10-29 DIAGNOSIS — E1169 Type 2 diabetes mellitus with other specified complication: Secondary | ICD-10-CM | POA: Diagnosis present

## 2012-10-29 DIAGNOSIS — M4802 Spinal stenosis, cervical region: Secondary | ICD-10-CM

## 2012-10-29 DIAGNOSIS — Z79899 Other long term (current) drug therapy: Secondary | ICD-10-CM

## 2012-10-29 DIAGNOSIS — I2510 Atherosclerotic heart disease of native coronary artery without angina pectoris: Secondary | ICD-10-CM

## 2012-10-29 DIAGNOSIS — Y9210 Unspecified residential institution as the place of occurrence of the external cause: Secondary | ICD-10-CM | POA: Diagnosis not present

## 2012-10-29 DIAGNOSIS — I447 Left bundle-branch block, unspecified: Secondary | ICD-10-CM | POA: Diagnosis present

## 2012-10-29 DIAGNOSIS — J9600 Acute respiratory failure, unspecified whether with hypoxia or hypercapnia: Secondary | ICD-10-CM | POA: Diagnosis present

## 2012-10-29 DIAGNOSIS — R000 Tachycardia, unspecified: Secondary | ICD-10-CM | POA: Diagnosis not present

## 2012-10-29 DIAGNOSIS — N179 Acute kidney failure, unspecified: Secondary | ICD-10-CM

## 2012-10-29 DIAGNOSIS — E785 Hyperlipidemia, unspecified: Secondary | ICD-10-CM

## 2012-10-29 DIAGNOSIS — I739 Peripheral vascular disease, unspecified: Secondary | ICD-10-CM | POA: Diagnosis present

## 2012-10-29 DIAGNOSIS — E119 Type 2 diabetes mellitus without complications: Secondary | ICD-10-CM | POA: Insufficient documentation

## 2012-10-29 DIAGNOSIS — I82A19 Acute embolism and thrombosis of unspecified axillary vein: Secondary | ICD-10-CM

## 2012-10-29 DIAGNOSIS — Z9181 History of falling: Secondary | ICD-10-CM

## 2012-10-29 DIAGNOSIS — L97809 Non-pressure chronic ulcer of other part of unspecified lower leg with unspecified severity: Secondary | ICD-10-CM | POA: Insufficient documentation

## 2012-10-29 DIAGNOSIS — I420 Dilated cardiomyopathy: Secondary | ICD-10-CM

## 2012-10-29 DIAGNOSIS — E1122 Type 2 diabetes mellitus with diabetic chronic kidney disease: Secondary | ICD-10-CM | POA: Diagnosis present

## 2012-10-29 DIAGNOSIS — J189 Pneumonia, unspecified organism: Secondary | ICD-10-CM

## 2012-10-29 DIAGNOSIS — R296 Repeated falls: Secondary | ICD-10-CM

## 2012-10-29 DIAGNOSIS — Z86718 Personal history of other venous thrombosis and embolism: Secondary | ICD-10-CM

## 2012-10-29 DIAGNOSIS — R269 Unspecified abnormalities of gait and mobility: Secondary | ICD-10-CM

## 2012-10-29 DIAGNOSIS — R0902 Hypoxemia: Secondary | ICD-10-CM

## 2012-10-29 DIAGNOSIS — I10 Essential (primary) hypertension: Secondary | ICD-10-CM

## 2012-10-29 DIAGNOSIS — T460X5A Adverse effect of cardiac-stimulant glycosides and drugs of similar action, initial encounter: Secondary | ICD-10-CM | POA: Diagnosis not present

## 2012-10-29 DIAGNOSIS — E162 Hypoglycemia, unspecified: Secondary | ICD-10-CM | POA: Insufficient documentation

## 2012-10-29 DIAGNOSIS — R5381 Other malaise: Secondary | ICD-10-CM | POA: Diagnosis not present

## 2012-10-29 DIAGNOSIS — I872 Venous insufficiency (chronic) (peripheral): Secondary | ICD-10-CM

## 2012-10-29 DIAGNOSIS — M1990 Unspecified osteoarthritis, unspecified site: Secondary | ICD-10-CM

## 2012-10-29 DIAGNOSIS — N184 Chronic kidney disease, stage 4 (severe): Secondary | ICD-10-CM | POA: Diagnosis present

## 2012-10-29 DIAGNOSIS — I5021 Acute systolic (congestive) heart failure: Secondary | ICD-10-CM

## 2012-10-29 DIAGNOSIS — I5023 Acute on chronic systolic (congestive) heart failure: Principal | ICD-10-CM

## 2012-10-29 DIAGNOSIS — I4892 Unspecified atrial flutter: Secondary | ICD-10-CM

## 2012-10-29 DIAGNOSIS — Z981 Arthrodesis status: Secondary | ICD-10-CM

## 2012-10-29 DIAGNOSIS — I2589 Other forms of chronic ischemic heart disease: Secondary | ICD-10-CM | POA: Diagnosis present

## 2012-10-29 DIAGNOSIS — G90511 Complex regional pain syndrome I of right upper limb: Secondary | ICD-10-CM

## 2012-10-29 DIAGNOSIS — G8250 Quadriplegia, unspecified: Secondary | ICD-10-CM

## 2012-10-29 DIAGNOSIS — D649 Anemia, unspecified: Secondary | ICD-10-CM

## 2012-10-29 DIAGNOSIS — G9340 Encephalopathy, unspecified: Secondary | ICD-10-CM

## 2012-10-29 DIAGNOSIS — S98139A Complete traumatic amputation of one unspecified lesser toe, initial encounter: Secondary | ICD-10-CM

## 2012-10-29 DIAGNOSIS — Z96649 Presence of unspecified artificial hip joint: Secondary | ICD-10-CM

## 2012-10-29 DIAGNOSIS — G959 Disease of spinal cord, unspecified: Secondary | ICD-10-CM

## 2012-10-29 DIAGNOSIS — F0390 Unspecified dementia without behavioral disturbance: Secondary | ICD-10-CM | POA: Diagnosis present

## 2012-10-29 HISTORY — DX: Cardiomyopathy, unspecified: I42.9

## 2012-10-29 LAB — COMPREHENSIVE METABOLIC PANEL
Albumin: 3.5 g/dL (ref 3.5–5.2)
Alkaline Phosphatase: 118 U/L — ABNORMAL HIGH (ref 39–117)
BUN: 35 mg/dL — ABNORMAL HIGH (ref 6–23)
CO2: 24 mEq/L (ref 19–32)
Chloride: 103 mEq/L (ref 96–112)
GFR calc Af Amer: 64 mL/min — ABNORMAL LOW (ref >90)
GFR calc non Af Amer: 55 mL/min — ABNORMAL LOW (ref >90)
Glucose, Bld: 104 mg/dL — ABNORMAL HIGH (ref 70–99)
Potassium: 4.5 mEq/L (ref 3.5–5.1)
Total Bilirubin: 0.7 mg/dL (ref 0.3–1.2)

## 2012-10-29 LAB — CREATININE, SERUM: GFR calc non Af Amer: 54 mL/min — ABNORMAL LOW (ref >90)

## 2012-10-29 LAB — URINALYSIS, ROUTINE W REFLEX MICROSCOPIC
Bilirubin Urine: NEGATIVE
Glucose, UA: NEGATIVE mg/dL
Hgb urine dipstick: NEGATIVE
Protein, ur: NEGATIVE mg/dL

## 2012-10-29 LAB — CBC WITH DIFFERENTIAL/PLATELET
Basophils Relative: 0 % (ref 0–1)
Eosinophils Absolute: 0.2 10*3/uL (ref 0.0–0.7)
MCH: 29.2 pg (ref 26.0–34.0)
MCHC: 32.8 g/dL (ref 30.0–36.0)
Monocytes Relative: 10 % (ref 3–12)
Neutro Abs: 5.9 10*3/uL (ref 1.7–7.7)
Neutrophils Relative %: 76 % (ref 43–77)
Platelets: 183 10*3/uL (ref 150–400)
WBC: 7.8 10*3/uL (ref 4.0–10.5)

## 2012-10-29 LAB — CBC
HCT: 30.4 % — ABNORMAL LOW (ref 39.0–52.0)
Hemoglobin: 10.1 g/dL — ABNORMAL LOW (ref 13.0–17.0)
MCH: 29.4 pg (ref 26.0–34.0)
MCHC: 33.2 g/dL (ref 30.0–36.0)
MCV: 88.6 fL (ref 78.0–100.0)
RDW: 18.4 % — ABNORMAL HIGH (ref 11.5–15.5)

## 2012-10-29 LAB — LACTIC ACID, PLASMA: Lactic Acid, Venous: 2 mmol/L (ref 0.5–2.2)

## 2012-10-29 LAB — HEMOGLOBIN A1C
Hgb A1c MFr Bld: 5.7 % — ABNORMAL HIGH (ref <5.7)
Mean Plasma Glucose: 117 mg/dL — ABNORMAL HIGH (ref <117)

## 2012-10-29 LAB — GLUCOSE, CAPILLARY
Glucose-Capillary: 96 mg/dL (ref 70–99)
Glucose-Capillary: 129 mg/dL — ABNORMAL HIGH (ref 70–99)

## 2012-10-29 MED ORDER — MORPHINE SULFATE 2 MG/ML IJ SOLN
1.0000 mg | INTRAMUSCULAR | Status: DC | PRN
  Administered 2012-11-03 (×2): 1 mg via INTRAVENOUS
  Filled 2012-10-29 (×2): qty 1

## 2012-10-29 MED ORDER — DEXTROSE 5 % IV SOLN
500.0000 mg | INTRAVENOUS | Status: DC
  Administered 2012-10-30: 500 mg via INTRAVENOUS
  Filled 2012-10-29 (×2): qty 500

## 2012-10-29 MED ORDER — SENNA 8.6 MG PO TABS
1.0000 | ORAL_TABLET | Freq: Two times a day (BID) | ORAL | Status: DC
  Administered 2012-10-29 – 2012-10-30 (×2): 8.6 mg via ORAL
  Filled 2012-10-29 (×3): qty 1

## 2012-10-29 MED ORDER — HEPARIN SODIUM (PORCINE) 5000 UNIT/ML IJ SOLN
5000.0000 [IU] | Freq: Three times a day (TID) | INTRAMUSCULAR | Status: DC
  Administered 2012-10-29 – 2012-11-07 (×26): 5000 [IU] via SUBCUTANEOUS
  Filled 2012-10-29 (×29): qty 1

## 2012-10-29 MED ORDER — ACETAMINOPHEN 325 MG PO TABS
650.0000 mg | ORAL_TABLET | Freq: Four times a day (QID) | ORAL | Status: DC | PRN
  Administered 2012-11-05: 650 mg via ORAL
  Filled 2012-10-29 (×2): qty 2

## 2012-10-29 MED ORDER — ONDANSETRON HCL 4 MG/2ML IJ SOLN: 4.0000 mg | Freq: Four times a day (QID) | INTRAMUSCULAR | Status: DC | PRN

## 2012-10-29 MED ORDER — ONDANSETRON HCL 4 MG PO TABS: 4.0000 mg | ORAL_TABLET | Freq: Four times a day (QID) | ORAL | Status: DC | PRN

## 2012-10-29 MED ORDER — ALUM & MAG HYDROXIDE-SIMETH 200-200-20 MG/5ML PO SUSP
30.0000 mL | Freq: Four times a day (QID) | ORAL | Status: DC | PRN
  Administered 2012-10-30: 30 mL via ORAL
  Filled 2012-10-29: qty 30

## 2012-10-29 MED ORDER — DEXTROSE 5 % IV SOLN
1.0000 g | Freq: Once | INTRAVENOUS | Status: AC
  Administered 2012-10-29: 1 g via INTRAVENOUS
  Filled 2012-10-29: qty 10

## 2012-10-29 MED ORDER — FLUTICASONE PROPIONATE 50 MCG/ACT NA SUSP
2.0000 | Freq: Every day | NASAL | Status: DC
  Administered 2012-10-30 – 2012-11-09 (×5): 2 via NASAL
  Filled 2012-10-29: qty 16

## 2012-10-29 MED ORDER — SODIUM CHLORIDE 0.9 % IV SOLN
INTRAVENOUS | Status: DC
  Administered 2012-10-29 – 2012-10-30 (×2): via INTRAVENOUS

## 2012-10-29 MED ORDER — SODIUM CHLORIDE 0.9 % IJ SOLN
3.0000 mL | Freq: Two times a day (BID) | INTRAMUSCULAR | Status: DC
  Administered 2012-10-30 – 2012-11-08 (×8): 3 mL via INTRAVENOUS

## 2012-10-29 MED ORDER — DEXTROSE 5 % IV SOLN
1.0000 g | INTRAVENOUS | Status: DC
  Administered 2012-10-30 – 2012-11-01 (×3): 1 g via INTRAVENOUS
  Filled 2012-10-29 (×4): qty 10

## 2012-10-29 MED ORDER — ACETAMINOPHEN 325 MG PO TABS
650.0000 mg | ORAL_TABLET | Freq: Once | ORAL | Status: AC
  Administered 2012-10-29: 650 mg via ORAL
  Filled 2012-10-29: qty 2

## 2012-10-29 MED ORDER — DEXTROSE 5 % IV SOLN
500.0000 mg | Freq: Once | INTRAVENOUS | Status: AC
  Administered 2012-10-29: 500 mg via INTRAVENOUS
  Filled 2012-10-29: qty 500

## 2012-10-29 MED ORDER — SODIUM CHLORIDE 0.9 % IV SOLN
1000.0000 mL | INTRAVENOUS | Status: DC
  Administered 2012-10-29: 1000 mL via INTRAVENOUS

## 2012-10-29 MED ORDER — GUAIFENESIN ER 600 MG PO TB12
1200.0000 mg | ORAL_TABLET | Freq: Two times a day (BID) | ORAL | Status: DC
  Administered 2012-10-29 – 2012-11-08 (×20): 1200 mg via ORAL
  Filled 2012-10-29 (×21): qty 2

## 2012-10-29 MED ORDER — INSULIN ASPART 100 UNIT/ML ~~LOC~~ SOLN
0.0000 [IU] | Freq: Three times a day (TID) | SUBCUTANEOUS | Status: DC
  Administered 2012-10-30 – 2012-10-31 (×3): 2 [IU] via SUBCUTANEOUS
  Administered 2012-11-01: 3 [IU] via SUBCUTANEOUS
  Administered 2012-11-02: 1 [IU] via SUBCUTANEOUS
  Administered 2012-11-03: 2 [IU] via SUBCUTANEOUS
  Administered 2012-11-03 (×2): 1 [IU] via SUBCUTANEOUS
  Administered 2012-11-04 (×2): 2 [IU] via SUBCUTANEOUS
  Administered 2012-11-05: 1 [IU] via SUBCUTANEOUS
  Administered 2012-11-05: 5 [IU] via SUBCUTANEOUS
  Administered 2012-11-05: 1 [IU] via SUBCUTANEOUS
  Administered 2012-11-06 – 2012-11-07 (×3): 2 [IU] via SUBCUTANEOUS
  Administered 2012-11-07: 3 [IU] via SUBCUTANEOUS
  Administered 2012-11-08 – 2012-11-09 (×5): 2 [IU] via SUBCUTANEOUS

## 2012-10-29 MED ORDER — IPRATROPIUM BROMIDE 0.02 % IN SOLN
0.5000 mg | Freq: Four times a day (QID) | RESPIRATORY_TRACT | Status: DC
  Administered 2012-10-29 – 2012-10-30 (×3): 0.5 mg via RESPIRATORY_TRACT
  Filled 2012-10-29 (×3): qty 2.5

## 2012-10-29 MED ORDER — ACETAMINOPHEN 650 MG RE SUPP: 650.0000 mg | Freq: Four times a day (QID) | RECTAL | Status: DC | PRN

## 2012-10-29 MED ORDER — ALBUTEROL SULFATE (5 MG/ML) 0.5% IN NEBU
2.5000 mg | INHALATION_SOLUTION | Freq: Four times a day (QID) | RESPIRATORY_TRACT | Status: DC
  Administered 2012-10-29 – 2012-10-30 (×3): 2.5 mg via RESPIRATORY_TRACT
  Filled 2012-10-29 (×3): qty 0.5

## 2012-10-29 MED ORDER — ASPIRIN 81 MG PO CHEW
81.0000 mg | CHEWABLE_TABLET | Freq: Every day | ORAL | Status: DC
  Administered 2012-10-29 – 2012-11-09 (×11): 81 mg via ORAL
  Filled 2012-10-29 (×11): qty 1

## 2012-10-29 MED ORDER — SIMVASTATIN 20 MG PO TABS
20.0000 mg | ORAL_TABLET | Freq: Every evening | ORAL | Status: DC
  Administered 2012-10-29 – 2012-11-05 (×8): 20 mg via ORAL
  Filled 2012-10-29 (×9): qty 1

## 2012-10-29 NOTE — ED Notes (Signed)
CBG obtained per MD request.

## 2012-10-29 NOTE — H&P (Signed)
[facility] History and Physical  [name] WJX:[id number] DOB: [birth year] DOA: 10/29/2012  Referring physician: Gerhard Munch, MD PCP: [name], MD   Chief Complaint: Hypoglycemia and altered mental status  HPI: [name] is a 72 y.o. male with past medical history of diabetes mellitus, hypertension and history of RUE DVT. Patient brought to the hospital because of confusion. Patient was been seen in the wound clinic this morning, he would have altered mental status and EMS was called. He was found to be hypoglycemic with CBG of 25. Patient has to use, dextrose and brought to the emergency department his CBG improved. Initial pulse ox in the emergency department was 75% on room air, with minimal improvement on nasal cannula. Patient was placed in her be the mask. Patient improved very well, and now he is on 2 L of oxygen satting 100%, CBG improved to 96. Patient evaluated by chest x-ray and showed bibasilar pneumonia.  Review of Systems:  Constitutional: negative for anorexia, fevers and sweats Eyes: negative for irritation, redness and visual disturbance Ears, nose, mouth, throat, and face: negative for earaches, epistaxis, nasal congestion and sore throat Respiratory: negative for cough, dyspnea on exertion, sputum and wheezing Cardiovascular: negative for chest pain, dyspnea, lower extremity edema, orthopnea, palpitations and syncope Gastrointestinal: negative for abdominal pain, constipation, diarrhea, melena, nausea and vomiting Genitourinary:negative for dysuria, frequency and hematuria Hematologic/lymphatic: negative for bleeding, easy bruising and lymphadenopathy Musculoskeletal:negative for arthralgias, muscle weakness and stiff joints Neurological: negative for coordination problems, gait problems, headaches and weakness Endocrine: negative for diabetic symptoms including polydipsia, polyuria and weight loss Allergic/Immunologic: negative for  anaphylaxis, hay fever and urticaria   Past Medical History  Diagnosis Date  . Diabetes mellitus   . Hypertension   . Hyperlipidemia   . Cervical spinal stenosis   . Lumbar spinal stenosis   . 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 DVT (deep venous thrombosis)     RUE 10/2011  . Anemia    Past Surgical History  Procedure Laterality Date  . Total hip arthroplasty    . Toe amputation    . Anterior cervical decomp/discectomy fusion  10/31/2011    Procedure: ANTERIOR CERVICAL DECOMPRESSION/DISCECTOMY FUSION 2 LEVELS;  Surgeon: Cristi Loron, MD;  Location: [facility];  Service: Neurosurgery;  Laterality: N/A;  Cervical Four-Five Cervical Five-Six Anterior Cervical Decompression with fusion interbody prothesis plating and bonegraft   Social History:  reports that he has never smoked. He does not have any smokeless tobacco history on file. He reports that he does not drink alcohol. His drug history is not on file.   No Known Allergies  Family History  Problem Relation Age of Onset  . Diabetes Brother   . Cancer Father     Lung cancer    Prior to Admission medications   Medication Sig Start Date End Date Taking? Authorizing Provider  acarbose (PRECOSE) 25 MG tablet Take 25 mg by mouth 3 (three) times daily with meals.    Historical Provider, MD  acetaminophen (TYLENOL) 325 MG tablet Take 650 mg by mouth once. Only for 72 hours. For pain    Historical Provider, MD  aspirin 81 MG chewable tablet Chew 81 mg by mouth daily.    Historical Provider, MD  captopril (CAPOTEN) 25 MG tablet Take 25 mg by mouth 3 (three) times daily.    Historical Provider, MD  cholecalciferol (VITAMIN D) 1000 UNITS tablet Take 2,000 Units by mouth daily.  Historical Provider, MD  fluticasone (FLONASE) 50 MCG/ACT nasal spray Place 2 sprays into the nose daily. 10/07/11 10/06/12  Maryruth Bun Rama, MD  gabapentin (NEURONTIN) 100 MG capsule Take 100 mg by mouth 2 (two) times daily.     Historical Provider, MD  glimepiride (AMARYL) 1 MG tablet Take 1 mg by mouth 2 (two) times daily.    Historical Provider, MD  HYDROcodone-acetaminophen (NORCO/VICODIN) 5-325 MG per tablet Take 1 tablet by mouth every 4 (four) hours as needed. For pain    Historical Provider, MD  LORazepam (ATIVAN) 0.5 MG tablet Take 0.5 mg by mouth every 6 (six) hours as needed. For anxiety    Historical Provider, MD  magnesium hydroxide (MILK OF MAGNESIA) 400 MG/5ML suspension Take 30 mLs by mouth daily as needed.    Historical Provider, MD  metFORMIN (GLUCOPHAGE) 1000 MG tablet Take 1,000 mg by mouth 2 (two) times daily with a meal.    Historical Provider, MD  methocarbamol (ROBAXIN) 500 MG tablet Take 500 mg by mouth every 8 (eight) hours as needed. For muscle spasm    Historical Provider, MD  rivaroxaban (XARELTO) 10 MG TABS tablet Take 20 mg by mouth daily with supper.    Historical Provider, MD  senna (SENOKOT) 8.6 MG TABS Take 1 tablet (8.6 mg total) by mouth 2 (two) times daily. 10/07/11   Christina P Rama, MD  simvastatin (ZOCOR) 20 MG tablet Take 20 mg by mouth every evening.    Historical Provider, MD  traMADol (ULTRAM) 50 MG tablet Take 50 mg by mouth 3 (three) times daily.    Historical Provider, MD  zolpidem (AMBIEN) 5 MG tablet Take 5 mg by mouth at bedtime as needed. For sleep    Historical Provider, MD   Physical Exam: Filed Vitals:   10/29/12 1315 10/29/12 1324 10/29/12 1400 10/29/12 1500  BP:  163/82 144/77 137/69  Pulse: 85 85 88 84  Temp:  97.9 F (36.6 C)    TempSrc:  Oral    Resp: 18 26 12 18   SpO2: 100% 100% 100% 100%  General appearance: alert, cooperative and no distress  Head: Normocephalic, without obvious abnormality, atraumatic  Eyes: conjunctivae/corneas clear. PERRL, EOM's intact. Fundi benign.  Nose: Nares normal. Septum midline. Mucosa normal. No drainage or sinus tenderness.  Throat: lips, mucosa, and tongue normal; teeth and gums normal  Neck: Supple, no masses, no  cervical lymphadenopathy, no JVD appreciated, no meningeal signs Resp: clear to auscultation bilaterally  Chest wall: no tenderness  Cardio: regular rate and rhythm, S1, S2 normal, no murmur, click, rub or gallop  GI: soft, non-tender; bowel sounds normal; no masses, no organomegaly  Extremities: extremities normal, atraumatic, no cyanosis or edema  Skin: Skin color, texture, turgor normal. No rashes or lesions  Neurologic: Alert and oriented X 3, normal strength and tone. Normal symmetric reflexes. Normal coordination and gait   Labs on Admission:  Basic Metabolic Panel:  Recent Labs Lab 10/29/12 1204  NA 140  K 4.5  CL 103  CO2 24  GLUCOSE 104*  BUN 35*  CREATININE 1.27  CALCIUM 10.1   Liver Function Tests:  Recent Labs Lab 10/29/12 1204  AST 33  ALT 17  ALKPHOS 118*  BILITOT 0.7  PROT 7.7  ALBUMIN 3.5   No results found for this basename: LIPASE, AMYLASE,  in the last 168 hours No results found for this basename: AMMONIA,  in the last 168 hours CBC:  Recent Labs Lab 10/29/12 1204  WBC 7.8  NEUTROABS 5.9  HGB 10.1*  HCT 30.8*  MCV 89.0  PLT 183   Cardiac Enzymes: No results found for this basename: CKTOTAL, CKMB, CKMBINDEX, TROPONINI,  in the last 168 hours  BNP (last 3 results) No results found for this basename: PROBNP,  in the last 8760 hours CBG:  Recent Labs Lab 10/29/12 1006 10/29/12 1023 10/29/12 1037 10/29/12 1152  GLUCAP 31* 25* 32* 96    Radiological Exams on Admission: Dg Chest 2 View  10/29/2012   *RADIOLOGY REPORT*  Clinical Data: Possible pneumonia  CHEST - 2 VIEW  Comparison: 10/29/2012, 10/27/2011  Findings: There is a similarly poor inspiratory effect as on the prior study.  Bilateral lower lobe opacities are stable.  Cardiac silhouette is enlarged.  Probable small bilateral pleural effusions appreciated.  IMPRESSION: Poor inspiratory effect.  Bilateral lower lobe opacities as well as probable small effusions.  Pneumonia is not  excluded.   Original Report Authenticated By: Esperanza Heir, M.D.   Ct Head Wo Contrast  10/29/2012   *RADIOLOGY REPORT*  Clinical Data: Altered mental status, hypoglycemia, confusion  CT HEAD WITHOUT CONTRAST  Technique:  Contiguous axial images were obtained from the base of the skull through the vertex without contrast.  Comparison: 11/17/2011  Findings: Mild diffuse cortical volume loss noted with proportional ventricular prominence.  This finding is stable. No acute hemorrhage, acute infarction, or mass lesion is seen.  Right maxillary sinus air fluid level is noted.  No skull fracture. Orbits are unremarkable.  No soft tissue abnormality.  IMPRESSION: No acute intracranial finding.  Right maxillary sinusitis/air fluid level.   Original Report Authenticated By: Christiana Pellant, M.D.   Dg Chest Port 1 View  10/29/2012   *RADIOLOGY REPORT*  Clinical Data: Shortness of breath  PORTABLE CHEST - 1 VIEW  Comparison: 10/27/2011  Findings: Lungs are hypoaerated.  Patchy bilateral lower lobe airspace opacities are present with trace effusions.  No pneumothorax.  No acute osseous finding.  IMPRESSION: Hypoaeration with bibasilar patchy air space opacities and trace effusions which could represent multilobar pneumonia or atelectasis given the degree of hypoaeration. If the patient's symptoms continue, consider PA and lateral chest radiographs obtained at full inspiration when the patient is clinically able.   Original Report Authenticated By: Christiana Pellant, M.D.    EKG: Independently reviewed.   Assessment/Plan Principal Problem:   Acute encephalopathy Active Problems:   Falls frequently   Diabetes mellitus, type 2   HTN (hypertension)   DVT of right proximal brachial through the distal axillary vein, acute   Hypoglycemia   Community acquired pneumonia   Acute encephalopathy -Likely secondary to hypoglycemia and hypoxia. -Patient confusion and altered mental status resolved after resolution of  the hypoglycemia/hypoxia. -Watch patient closely.  Community acquired pneumonia -Chest x-ray showing bibasilar infiltrates consistent with community acquired pneumonia.  -Patient started on Rocephin and azithromycin in the ED, was continued. -Supportive management with mucolytics, antitussives and oxygen as needed. -Likely the hypoxia secondary to the pneumonia plus the hypoglycemia.  Hypoglycemia -In patient was type 2 diabetes mellitus, patient is on Amaryl. - This is resolved, I will hold his oral hypoglycemic agents, start SSI.  Diabetes mellitus type 2 -Started carbohydrate modified diet, SSI. -Check hemoglobin A1c and hold oral hypoglycemics.  Hypoxia -Likely secondary to hypoglycemia community acquired pneumonia. -This is resolved, patient is on 2 L and his oxygen saturation is 100%.  Chronic bilateral lower extremity ulcers -Secondary to peripheral vascular l disease, patient follows with outpatient wound clinic. -No evidence of infection  or recent change in the ulcers.   History of right upper extremity DVT -Patient is on was on Xarelto and 2013, not taking it anymore.  Code Status:  full code  Family Communication:  plan discussed with the patient  Disposition Plan: Inpatient, telemetry, anticipate length of stay to be greater than 2 midnights.  Time spent:  70 minutes  [facility] Pager [phone]  If 7PM-7AM, please contact night-coverage [email or web address] Password [facility] 10/29/2012, 4:23 PM

## 2012-10-29 NOTE — Progress Notes (Deleted)
[facility] History and Physical  [name] GEX:[id number] DOB: 1940/05/26 DOA: 10/29/2012  Referring physician: Gerhard Munch, MD PCP: [name], MD   Chief Complaint: Hypoglycemia and altered mental status  HPI: [name] is a 72 y.o. male with past medical history of diabetes mellitus, hypertension and history of RUE DVT. Patient brought to the hospital because of confusion. Patient was been seen in the wound clinic this morning, he would have altered mental status and EMS was called. He was found to be hypoglycemic with CBG of 25. Patient has to use, dextrose and brought to the emergency department his CBG improved. Initial pulse ox in the emergency department was 75% on room air, with minimal improvement on nasal cannula. Patient was placed in her be the mask. Patient improved very well, and now he is on 2 L of oxygen satting 100%, CBG improved to 96. Patient evaluated by chest x-ray and showed bibasilar pneumonia.  Review of Systems:  Constitutional: negative for anorexia, fevers and sweats Eyes: negative for irritation, redness and visual disturbance Ears, nose, mouth, throat, and face: negative for earaches, epistaxis, nasal congestion and sore throat Respiratory: negative for cough, dyspnea on exertion, sputum and wheezing Cardiovascular: negative for chest pain, dyspnea, lower extremity edema, orthopnea, palpitations and syncope Gastrointestinal: negative for abdominal pain, constipation, diarrhea, melena, nausea and vomiting Genitourinary:negative for dysuria, frequency and hematuria Hematologic/lymphatic: negative for bleeding, easy bruising and lymphadenopathy Musculoskeletal:negative for arthralgias, muscle weakness and stiff joints Neurological: negative for coordination problems, gait problems, headaches and weakness Endocrine: negative for diabetic symptoms including polydipsia, polyuria and weight loss Allergic/Immunologic: negative for  anaphylaxis, hay fever and urticaria   Past Medical History  Diagnosis Date  . Diabetes mellitus   . Hypertension   . Hyperlipidemia   . Cervical spinal stenosis   . Lumbar spinal stenosis   . 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 DVT (deep venous thrombosis)     RUE 10/2011  . Anemia    Past Surgical History  Procedure Laterality Date  . Total hip arthroplasty    . Toe amputation    . Anterior cervical decomp/discectomy fusion  10/31/2011    Procedure: ANTERIOR CERVICAL DECOMPRESSION/DISCECTOMY FUSION 2 LEVELS;  Surgeon: Cristi Loron, MD;  Location: [facility];  Service: Neurosurgery;  Laterality: N/A;  Cervical Four-Five Cervical Five-Six Anterior Cervical Decompression with fusion interbody prothesis plating and bonegraft   Social History:  reports that he has never smoked. He does not have any smokeless tobacco history on file. He reports that he does not drink alcohol. His drug history is not on file.   No Known Allergies  Family History  Problem Relation Age of Onset  . Diabetes Brother   . Cancer Father     Lung cancer    Prior to Admission medications   Medication Sig Start Date End Date Taking? Authorizing Provider  acarbose (PRECOSE) 25 MG tablet Take 25 mg by mouth 3 (three) times daily with meals.    Historical Provider, MD  acetaminophen (TYLENOL) 325 MG tablet Take 650 mg by mouth once. Only for 72 hours. For pain    Historical Provider, MD  aspirin 81 MG chewable tablet Chew 81 mg by mouth daily.    Historical Provider, MD  captopril (CAPOTEN) 25 MG tablet Take 25 mg by mouth 3 (three) times daily.    Historical Provider, MD  cholecalciferol (VITAMIN D) 1000 UNITS tablet Take 2,000 Units by mouth daily.  Historical Provider, MD  fluticasone (FLONASE) 50 MCG/ACT nasal spray Place 2 sprays into the nose daily. 10/07/11 10/06/12  Maryruth Bun Rama, MD  gabapentin (NEURONTIN) 100 MG capsule Take 100 mg by mouth 2 (two) times daily.     Historical Provider, MD  glimepiride (AMARYL) 1 MG tablet Take 1 mg by mouth 2 (two) times daily.    Historical Provider, MD  HYDROcodone-acetaminophen (NORCO/VICODIN) 5-325 MG per tablet Take 1 tablet by mouth every 4 (four) hours as needed. For pain    Historical Provider, MD  LORazepam (ATIVAN) 0.5 MG tablet Take 0.5 mg by mouth every 6 (six) hours as needed. For anxiety    Historical Provider, MD  magnesium hydroxide (MILK OF MAGNESIA) 400 MG/5ML suspension Take 30 mLs by mouth daily as needed.    Historical Provider, MD  metFORMIN (GLUCOPHAGE) 1000 MG tablet Take 1,000 mg by mouth 2 (two) times daily with a meal.    Historical Provider, MD  methocarbamol (ROBAXIN) 500 MG tablet Take 500 mg by mouth every 8 (eight) hours as needed. For muscle spasm    Historical Provider, MD  rivaroxaban (XARELTO) 10 MG TABS tablet Take 20 mg by mouth daily with supper.    Historical Provider, MD  senna (SENOKOT) 8.6 MG TABS Take 1 tablet (8.6 mg total) by mouth 2 (two) times daily. 10/07/11   Christina P Rama, MD  simvastatin (ZOCOR) 20 MG tablet Take 20 mg by mouth every evening.    Historical Provider, MD  traMADol (ULTRAM) 50 MG tablet Take 50 mg by mouth 3 (three) times daily.    Historical Provider, MD  zolpidem (AMBIEN) 5 MG tablet Take 5 mg by mouth at bedtime as needed. For sleep    Historical Provider, MD   Physical Exam: Filed Vitals:   10/29/12 1230 10/29/12 1315 10/29/12 1324 10/29/12 1400  BP: 135/76  163/82 144/77  Pulse: 81 85 85 88  Temp:   97.9 F (36.6 C)   TempSrc:   Oral   Resp: 23 18 26 12   SpO2: 100% 100% 100% 100%  General appearance: alert, cooperative and no distress  Head: Normocephalic, without obvious abnormality, atraumatic  Eyes: conjunctivae/corneas clear. PERRL, EOM's intact. Fundi benign.  Nose: Nares normal. Septum midline. Mucosa normal. No drainage or sinus tenderness.  Throat: lips, mucosa, and tongue normal; teeth and gums normal  Neck: Supple, no masses, no  cervical lymphadenopathy, no JVD appreciated, no meningeal signs Resp: clear to auscultation bilaterally  Chest wall: no tenderness  Cardio: regular rate and rhythm, S1, S2 normal, no murmur, click, rub or gallop  GI: soft, non-tender; bowel sounds normal; no masses, no organomegaly  Extremities: extremities normal, atraumatic, no cyanosis or edema  Skin: Skin color, texture, turgor normal. No rashes or lesions  Neurologic: Alert and oriented X 3, normal strength and tone. Normal symmetric reflexes. Normal coordination and gait   Labs on Admission:  Basic Metabolic Panel:  Recent Labs Lab 10/29/12 1204  NA 140  K 4.5  CL 103  CO2 24  GLUCOSE 104*  BUN 35*  CREATININE 1.27  CALCIUM 10.1   Liver Function Tests:  Recent Labs Lab 10/29/12 1204  AST 33  ALT 17  ALKPHOS 118*  BILITOT 0.7  PROT 7.7  ALBUMIN 3.5   No results found for this basename: LIPASE, AMYLASE,  in the last 168 hours No results found for this basename: AMMONIA,  in the last 168 hours CBC:  Recent Labs Lab 10/29/12 1204  WBC 7.8  NEUTROABS 5.9  HGB 10.1*  HCT 30.8*  MCV 89.0  PLT 183   Cardiac Enzymes: No results found for this basename: CKTOTAL, CKMB, CKMBINDEX, TROPONINI,  in the last 168 hours  BNP (last 3 results) No results found for this basename: PROBNP,  in the last 8760 hours CBG:  Recent Labs Lab 10/29/12 1006 10/29/12 1023 10/29/12 1037 10/29/12 1152  GLUCAP 31* 25* 32* 96    Radiological Exams on Admission: Dg Chest 2 View  10/29/2012   *RADIOLOGY REPORT*  Clinical Data: Possible pneumonia  CHEST - 2 VIEW  Comparison: 10/29/2012, 10/27/2011  Findings: There is a similarly poor inspiratory effect as on the prior study.  Bilateral lower lobe opacities are stable.  Cardiac silhouette is enlarged.  Probable small bilateral pleural effusions appreciated.  IMPRESSION: Poor inspiratory effect.  Bilateral lower lobe opacities as well as probable small effusions.  Pneumonia is not  excluded.   Original Report Authenticated By: Esperanza Heir, M.D.   Ct Head Wo Contrast  10/29/2012   *RADIOLOGY REPORT*  Clinical Data: Altered mental status, hypoglycemia, confusion  CT HEAD WITHOUT CONTRAST  Technique:  Contiguous axial images were obtained from the base of the skull through the vertex without contrast.  Comparison: 11/17/2011  Findings: Mild diffuse cortical volume loss noted with proportional ventricular prominence.  This finding is stable. No acute hemorrhage, acute infarction, or mass lesion is seen.  Right maxillary sinus air fluid level is noted.  No skull fracture. Orbits are unremarkable.  No soft tissue abnormality.  IMPRESSION: No acute intracranial finding.  Right maxillary sinusitis/air fluid level.   Original Report Authenticated By: Christiana Pellant, M.D.   Dg Chest Port 1 View  10/29/2012   *RADIOLOGY REPORT*  Clinical Data: Shortness of breath  PORTABLE CHEST - 1 VIEW  Comparison: 10/27/2011  Findings: Lungs are hypoaerated.  Patchy bilateral lower lobe airspace opacities are present with trace effusions.  No pneumothorax.  No acute osseous finding.  IMPRESSION: Hypoaeration with bibasilar patchy air space opacities and trace effusions which could represent multilobar pneumonia or atelectasis given the degree of hypoaeration. If the patient's symptoms continue, consider PA and lateral chest radiographs obtained at full inspiration when the patient is clinically able.   Original Report Authenticated By: Christiana Pellant, M.D.    EKG: Independently reviewed.   Assessment/Plan Principal Problem:   Acute encephalopathy Active Problems:   Falls frequently   Diabetes mellitus, type 2   HTN (hypertension)   DVT of right proximal brachial through the distal axillary vein, acute   Hypoglycemia   Community acquired pneumonia   Acute encephalopathy -Likely secondary to hypoglycemia and hypoxia. -Patient confusion and altered mental status resolved after resolution of  the hypoglycemia/hypoxia. -Watch patient closely.  Community acquired pneumonia -Chest x-ray showing bibasilar infiltrates consistent with community acquired pneumonia.  -Patient started on Rocephin and azithromycin in the ED, was continued. -Supportive management with mucolytics, antitussives and oxygen as needed. -Likely the hypoxia secondary to the pneumonia plus the hypoglycemia.  Hypoglycemia -In patient was type 2 diabetes mellitus, patient is on Amaryl. - This is resolved, I will hold his oral hypoglycemic agents, start SSI.  Diabetes mellitus type 2 -Started carbohydrate modified diet, SSI. -Check hemoglobin A1c and hold oral hypoglycemics.  Hypoxia -Likely secondary to hypoglycemia community acquired pneumonia. -This is resolved, patient is on 2 L and his oxygen saturation is 100%.  Chronic bilateral lower extremity ulcers -Secondary to peripheral vascular l disease, patient follows with outpatient wound clinic. -No evidence of infection  or recent change in the ulcers.   History of right upper extremity DVT -Patient is on was on Xarelto and 2013, not taking it anymore.  Code Status:  full code  Family Communication:  plan discussed with the patient  Disposition Plan: Inpatient, telemetry, anticipate length of stay to be greater than 2 midnights.  Time spent:  70 minutes  [facility] Pager [phone]  If 7PM-7AM, please contact night-coverage [email or web address] Password [facility] 10/29/2012, 2:40 PM

## 2012-10-29 NOTE — ED Notes (Signed)
Report attempt x 1 

## 2012-10-29 NOTE — ED Provider Notes (Signed)
CSN: [id number]     Arrival date & time 10/29/12  1126 History     First MD Initiated Contact with Patient 10/29/12 1129     Chief Complaint  Patient presents with  . Altered Mental Status  . Hypoglycemia   (Consider location/radiation/quality/duration/timing/severity/associated sxs/prior Treatment) HPI Patient presents from wound care clinic with concerns of altered mental status. Per EMS the patient was listless during evaluation for chronic leg wounds.  Per report the patient had hypoglycemia, with CBG 25.  Following juice, dextrose, the patient's CBC improved substantially.  However, the patient remained listless. Initial pulse oximetry had 75%, minimal improvement with nasal cannula, substantial improvement with nonrebreather mask. On my initial exam, during the patient's transfer from EMS, the patient is listless, but moving all extremities minimally. This is a level V caveat.  Past Medical History  Diagnosis Date  . Diabetes mellitus   . Hypertension   . Hyperlipidemia   . Cervical spinal stenosis   . Lumbar spinal stenosis   . 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 DVT (deep venous thrombosis)     RUE 10/2011  . Anemia    Past Surgical History  Procedure Laterality Date  . Total hip arthroplasty    . Toe amputation    . Anterior cervical decomp/discectomy fusion  10/31/2011    Procedure: ANTERIOR CERVICAL DECOMPRESSION/DISCECTOMY FUSION 2 LEVELS;  Surgeon: Cristi Loron, MD;  Location: [facility];  Service: Neurosurgery;  Laterality: N/A;  Cervical Four-Five Cervical Five-Six Anterior Cervical Decompression with fusion interbody prothesis plating and bonegraft   Family History  Problem Relation Age of Onset  . Diabetes Brother    History  Substance Use Topics  . Smoking status: Never Smoker   . Smokeless tobacco: Not on file  . Alcohol Use: No    Review of Systems  Unable to perform ROS: Mental status change    Allergies  Review  of patient's allergies indicates no known allergies.  Home Medications   Current Outpatient Rx  Name  Route  Sig  Dispense  Refill  . acarbose (PRECOSE) 25 MG tablet   Oral   Take 25 mg by mouth 3 (three) times daily with meals.         Marland Kitchen acetaminophen (TYLENOL) 325 MG tablet   Oral   Take 650 mg by mouth once. Only for 72 hours. For pain         . aspirin 81 MG chewable tablet   Oral   Chew 81 mg by mouth daily.         . captopril (CAPOTEN) 25 MG tablet   Oral   Take 25 mg by mouth 3 (three) times daily.         . cholecalciferol (VITAMIN D) 1000 UNITS tablet   Oral   Take 2,000 Units by mouth daily.         Marland Kitchen EXPIRED: fluticasone (FLONASE) 50 MCG/ACT nasal spray   Nasal   Place 2 sprays into the nose daily.         Marland Kitchen gabapentin (NEURONTIN) 100 MG capsule   Oral   Take 100 mg by mouth 2 (two) times daily.         Marland Kitchen glimepiride (AMARYL) 1 MG tablet   Oral   Take 1 mg by mouth 2 (two) times daily.         Marland Kitchen HYDROcodone-acetaminophen (NORCO/VICODIN) 5-325 MG per tablet   Oral   Take 1 tablet by mouth  every 4 (four) hours as needed. For pain         . LORazepam (ATIVAN) 0.5 MG tablet   Oral   Take 0.5 mg by mouth every 6 (six) hours as needed. For anxiety         . magnesium hydroxide (MILK OF MAGNESIA) 400 MG/5ML suspension   Oral   Take 30 mLs by mouth daily as needed.         . metFORMIN (GLUCOPHAGE) 1000 MG tablet   Oral   Take 1,000 mg by mouth 2 (two) times daily with a meal.         . methocarbamol (ROBAXIN) 500 MG tablet   Oral   Take 500 mg by mouth every 8 (eight) hours as needed. For muscle spasm         . rivaroxaban (XARELTO) 10 MG TABS tablet   Oral   Take 20 mg by mouth daily with supper.         . senna (SENOKOT) 8.6 MG TABS   Oral   Take 1 tablet (8.6 mg total) by mouth 2 (two) times daily.   120 each      . simvastatin (ZOCOR) 20 MG tablet   Oral   Take 20 mg by mouth every evening.         .  traMADol (ULTRAM) 50 MG tablet   Oral   Take 50 mg by mouth 3 (three) times daily.         Marland Kitchen zolpidem (AMBIEN) 5 MG tablet   Oral   Take 5 mg by mouth at bedtime as needed. For sleep          BP 141/81  Pulse 95  Temp(Src) 97.6 F (36.4 C) (Oral)  SpO2 100% Physical Exam  Nursing note and vitals reviewed. Constitutional: He is oriented to person, place, and time. He appears well-developed and well-nourished.  Elderly bearded male wearing nonrebreather mask.  Awakens minimally  HENT:  Head: Normocephalic and atraumatic.  Eyes: Conjunctivae and EOM are normal.  Neck: No tracheal deviation present.  Cardiovascular: Normal rate and regular rhythm.   Pulmonary/Chest: No stridor. Tachypnea noted. He has decreased breath sounds.  Abdominal: He exhibits no distension.  Musculoskeletal: He exhibits no edema.  Neurological: He is alert and oriented to person, place, and time.  Patient moves all extremities minimally, spontaneously.  The  Skin: Skin is warm and dry.  Both lower extremities are wrapped from the knee down.  This will require additional evaluation, though there is no seeping of blood or discharge through the dressings   Psychiatric: He has a normal mood and affect.  Patient listless    ED Course  11:49 AM Following transfer, continued oxygen supplementation, the patient now awakens much more easily.  He denies pain, states that he has felt poorly for several days.  He can move all extremities to command, tracks visually appropriately.  He denies nausea, confusion, disorientation.   Pulse oximetry 99% with nonrebreather mask this is abnormal An EKG from EMS the patient had a left bundle branch block with rate of 95, this was abnormal  Procedures (including critical care time)  Labs Reviewed  COMPREHENSIVE METABOLIC PANEL  LACTIC ACID, PLASMA  PROTIME-INR  URINALYSIS, ROUTINE W REFLEX MICROSCOPIC  CBC WITH DIFFERENTIAL   No results found. No diagnosis  found.   1:53 PM Patient awake and alert.  EKG here has rates 94, regular rhythm with intraventricular conduction delay.  No significant ST changes.  Abnormal  I reviewed the initial results with the patient, and we discussed admission for pneumonia.  I evaluated the patient's leg wounds.  Removal of dressing demonstrates multiple areas of slowly healing stasis wounds without any discharge according to the patient. 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  MDM  Patient presents after an episode of altered metal status with hypoglycemia.,  On ED arrival patient is listless, hypoxic.  Patient did improve here.  Patient has multiple risk factors, and given extra evidence of bilateral pneumonia, he was admitted for further evaluation, management, monitoring.  Gerhard Munch, MD 10/29/12 1355

## 2012-10-29 NOTE — ED Notes (Signed)
Pt to ED via EMS from wound care and hyper[facility]ic center for altered mental status and hypoglycemia. Initial CBG-25, pt given juice and crackers that up CBG-35. EMS given Dex 10%, CBG-89. Per EMS, pt initial SpO2 75%, placed on 4L [place] and no increase, pt placed on non-rebreather and SpO2 95-98%. Pt is still confused. Pt went to wound care and hyperbaric center for lower extremities leaking edema. Per family, pt has been acting differently for the last 2-3 days.

## 2012-10-30 ENCOUNTER — Inpatient Hospital Stay (HOSPITAL_COMMUNITY): Payer: Medicare Other

## 2012-10-30 DIAGNOSIS — R4182 Altered mental status, unspecified: Secondary | ICD-10-CM

## 2012-10-30 DIAGNOSIS — E119 Type 2 diabetes mellitus without complications: Secondary | ICD-10-CM

## 2012-10-30 LAB — GLUCOSE, CAPILLARY: Glucose-Capillary: 75 mg/dL (ref 70–99)

## 2012-10-30 LAB — CBC
HCT: 31 % — ABNORMAL LOW (ref 39.0–52.0)
Hemoglobin: 9.8 g/dL — ABNORMAL LOW (ref 13.0–17.0)
MCV: 89.1 fL (ref 78.0–100.0)
RBC: 3.48 MIL/uL — ABNORMAL LOW (ref 4.22–5.81)
RDW: 18.4 % — ABNORMAL HIGH (ref 11.5–15.5)
WBC: 4.8 10*3/uL (ref 4.0–10.5)

## 2012-10-30 LAB — BASIC METABOLIC PANEL
CO2: 24 mEq/L (ref 19–32)
Chloride: 102 mEq/L (ref 96–112)
Creatinine, Ser: 1.29 mg/dL (ref 0.50–1.35)
GFR calc Af Amer: 63 mL/min — ABNORMAL LOW (ref >90)
Potassium: 4.5 mEq/L (ref 3.5–5.1)
Sodium: 136 mEq/L (ref 135–145)

## 2012-10-30 LAB — TROPONIN I: Troponin I: <0.3 ng/mL (ref <0.30)

## 2012-10-30 MED ORDER — POLYETHYLENE GLYCOL 3350 17 G PO PACK
17.0000 g | PACK | Freq: Every day | ORAL | Status: DC
  Administered 2012-10-30 – 2012-11-08 (×4): 17 g via ORAL
  Filled 2012-10-30 (×11): qty 1

## 2012-10-30 MED ORDER — SIMETHICONE 80 MG PO CHEW
80.0000 mg | CHEWABLE_TABLET | Freq: Four times a day (QID) | ORAL | Status: DC | PRN
  Administered 2012-10-30: 80 mg via ORAL
  Filled 2012-10-30 (×2): qty 1

## 2012-10-30 MED ORDER — LORAZEPAM 1 MG PO TABS
1.0000 mg | ORAL_TABLET | Freq: Three times a day (TID) | ORAL | Status: DC | PRN
  Administered 2012-10-30 – 2012-10-31 (×2): 1 mg via ORAL
  Filled 2012-10-30 (×2): qty 1

## 2012-10-30 MED ORDER — CAPTOPRIL 25 MG PO TABS
25.0000 mg | ORAL_TABLET | Freq: Two times a day (BID) | ORAL | Status: DC
  Administered 2012-10-30 – 2012-11-02 (×6): 25 mg via ORAL
  Filled 2012-10-30 (×7): qty 1

## 2012-10-30 MED ORDER — SENNOSIDES-DOCUSATE SODIUM 8.6-50 MG PO TABS
2.0000 | ORAL_TABLET | Freq: Two times a day (BID) | ORAL | Status: DC
  Administered 2012-10-30 – 2012-11-08 (×15): 2 via ORAL
  Filled 2012-10-30 (×21): qty 2

## 2012-10-30 MED ORDER — MAGNESIUM HYDROXIDE 400 MG/5ML PO SUSP
30.0000 mL | Freq: Every day | ORAL | Status: DC | PRN
  Administered 2012-10-30 – 2012-11-05 (×2): 30 mL via ORAL
  Filled 2012-10-30 (×2): qty 30

## 2012-10-30 MED ORDER — HYDROCODONE-ACETAMINOPHEN 5-325 MG PO TABS
1.0000 | ORAL_TABLET | ORAL | Status: DC | PRN
  Administered 2012-10-30: 2 via ORAL
  Administered 2012-10-31: 1 via ORAL
  Administered 2012-11-01 – 2012-11-09 (×10): 2 via ORAL
  Filled 2012-10-30: qty 2
  Filled 2012-10-30: qty 1
  Filled 2012-10-30 (×11): qty 2

## 2012-10-30 MED ORDER — FUROSEMIDE 20 MG PO TABS
20.0000 mg | ORAL_TABLET | Freq: Two times a day (BID) | ORAL | Status: DC
  Administered 2012-10-30 – 2012-11-01 (×4): 20 mg via ORAL
  Filled 2012-10-30 (×6): qty 1

## 2012-10-30 MED ORDER — GABAPENTIN 300 MG PO CAPS
300.0000 mg | ORAL_CAPSULE | Freq: Three times a day (TID) | ORAL | Status: DC
  Administered 2012-10-30 – 2012-10-31 (×2): 300 mg via ORAL
  Filled 2012-10-30 (×4): qty 1

## 2012-10-30 MED ORDER — ALBUTEROL SULFATE (5 MG/ML) 0.5% IN NEBU: 2.5000 mg | INHALATION_SOLUTION | Freq: Four times a day (QID) | RESPIRATORY_TRACT | Status: DC | PRN

## 2012-10-30 MED ORDER — BISACODYL 10 MG RE SUPP: 10.0000 mg | Freq: Every day | RECTAL | Status: DC | PRN

## 2012-10-30 NOTE — Progress Notes (Addendum)
HR up to 130's at this time; EKG done; BP stable; order for troponin to cycle; pt anxious and wanting to go home at this time; new orders for Ativan; will administer when available; will cont. To monitor.

## 2012-10-30 NOTE — Progress Notes (Addendum)
[facility] PROGRESS NOTE  [name] ZOX:[id number] DOB: 1940-05-09 DOA: 10/29/2012 PCP: [name], MD HPI:   [name] is a 72 y.o. male with past medical history of diabetes mellitus, hypertension and history of RUE DVT. Patient brought to the hospital because of confusion. Patient was been seen in the wound clinic this morning, he would have altered mental status and EMS was called. He was found to be hypoglycemic with CBG of 25. Patient has to use, dextrose and brought to the emergency department his CBG improved. Initial pulse ox in the emergency department was 75% on room air, with minimal improvement on nasal cannula. Patient was placed in her be the mask. Patient improved very well, and now he is on 2 L of oxygen satting 100%, CBG improved to 96. Patient evaluated by chest x-ray and showed bibasilar pneumonia  Assessment/Plan:  Acute encephalopathy  -Likely secondary to hypoglycemia and hypoxia.  -Patient confusion and altered mental status resolved after resolution of the hypoglycemia/hypoxia.  -Watch patient closely.   Community acquired pneumonia  -Chest x-ray showing bibasilar infiltrates consistent with community acquired pneumonia.  -Patient started on Rocephin and azithromycin in the ED, was continued.  -Supportive management with mucolytics, antitussives and oxygen as needed.  -Likely the hypoxia secondary to the pneumonia.  Hypoglycemia  -In patient was type 2 diabetes mellitus, patient is on Amaryl.  - This is resolved, I will hold his oral hypoglycemic agents, start SSI.   Diabetes mellitus type 2  -Started carbohydrate modified diet, SSI.  -hemoglobin A1c is 5.7 hold oral hypoglycemics.   Hypoxia  -Likely secondary to community acquired pneumonia.  -This is resolved, patient is on 2 L and his oxygen saturation is 100%.  - probably  to check the need for oxygen on ambulation at the time of discharge.   Chronic bilateral lower extremity  ulcers  -Secondary to peripheral vascular l disease, patient follows with outpatient wound clinic.  -No evidence of infection or recent change in the ulcers.  - wound care consulted.   History of right upper extremity DVT  -Patient is on was on Xarelto and 2013, not taking it anymore.   Abdominal pain : gen tenderness, will get abd films today.   Anemia: normocytic. Will get anemia panel.    Code Status: full code  Family Communication: plan discussed with the patient  Disposition Plan: Pending PT EVAL.     Consultants:  Wound care  Procedures:  none  Antibiotics:  Rocephin and zithromax from 7/28  HPI/Subjective: Has some abd discomfort.   Objective: Filed Vitals:   10/29/12 1718 10/29/12 2201 10/30/12 0557 10/30/12 0849  BP: 125/69 143/80 151/93   Pulse: 74 87 89   Temp: 97.5 F (36.4 C) 97.3 F (36.3 C) 98.3 F (36.8 C)   TempSrc: Oral Oral Oral   Resp: 18 18 20    Height: 5\' 10"  (1.778 m)     Weight: 101 kg (222 lb 10.6 oz)     SpO2: 98% 98% 97% 94%    Intake/Output Summary (Last 24 hours) at 10/30/12 1125 Last data filed at 10/30/12 1032  Gross per 24 hour  Intake   2080 ml  Output   1525 ml  Net    555 ml   Filed Weights   10/29/12 1718  Weight: 101 kg (222 lb 10.6 oz)    Exam:   General:  Alert afebrile comfortable  Cardiovascular: s1s2  Respiratory: decreased air entry and scattered rhonchi.   Abdomen: soft  mild generalized tenderness,  ND BS+  Musculoskeletal:   Data Reviewed: Basic Metabolic Panel:  Recent Labs Lab 10/29/12 1204 10/29/12 1826 10/30/12 0524  NA 140  --  136  K 4.5  --  4.5  CL 103  --  102  CO2 24  --  24  GLUCOSE 104*  --  130*  BUN 35*  --  33*  CREATININE 1.27 1.29 1.29  CALCIUM 10.1  --  9.7   Liver Function Tests:  Recent Labs Lab 10/29/12 1204  AST 33  ALT 17  ALKPHOS 118*  BILITOT 0.7  PROT 7.7  ALBUMIN 3.5   No results found for this basename: LIPASE, AMYLASE,  in the last 168  hours No results found for this basename: AMMONIA,  in the last 168 hours CBC:  Recent Labs Lab 10/29/12 1204 10/29/12 1826 10/30/12 0524  WBC 7.8 5.6 4.8  NEUTROABS 5.9  --   --   HGB 10.1* 10.1* 9.8*  HCT 30.8* 30.4* 31.0*  MCV 89.0 88.6 89.1  PLT 183 172 176   Cardiac Enzymes: No results found for this basename: CKTOTAL, CKMB, CKMBINDEX, TROPONINI,  in the last 168 hours BNP (last 3 results) No results found for this basename: PROBNP,  in the last 8760 hours CBG:  Recent Labs Lab 10/29/12 1037 10/29/12 1152 10/29/12 1717 10/29/12 2108 10/30/12 0559  GLUCAP 32* 96 144* 129* 130*    No results found for this or any previous visit (from the past 240 hour(s)).   Studies: Dg Chest 2 View  10/29/2012   *RADIOLOGY REPORT*  Clinical Data: Possible pneumonia  CHEST - 2 VIEW  Comparison: 10/29/2012, 10/27/2011  Findings: There is a similarly poor inspiratory effect as on the prior study.  Bilateral lower lobe opacities are stable.  Cardiac silhouette is enlarged.  Probable small bilateral pleural effusions appreciated.  IMPRESSION: Poor inspiratory effect.  Bilateral lower lobe opacities as well as probable small effusions.  Pneumonia is not excluded.   Original Report Authenticated By: Esperanza Heir, M.D.   Ct Head Wo Contrast  10/29/2012   *RADIOLOGY REPORT*  Clinical Data: Altered mental status, hypoglycemia, confusion  CT HEAD WITHOUT CONTRAST  Technique:  Contiguous axial images were obtained from the base of the skull through the vertex without contrast.  Comparison: 11/17/2011  Findings: Mild diffuse cortical volume loss noted with proportional ventricular prominence.  This finding is stable. No acute hemorrhage, acute infarction, or mass lesion is seen.  Right maxillary sinus air fluid level is noted.  No skull fracture. Orbits are unremarkable.  No soft tissue abnormality.  IMPRESSION: No acute intracranial finding.  Right maxillary sinusitis/air fluid level.   Original  Report Authenticated By: Christiana Pellant, M.D.   Dg Chest Port 1 View  10/29/2012   *RADIOLOGY REPORT*  Clinical Data: Shortness of breath  PORTABLE CHEST - 1 VIEW  Comparison: 10/27/2011  Findings: Lungs are hypoaerated.  Patchy bilateral lower lobe airspace opacities are present with trace effusions.  No pneumothorax.  No acute osseous finding.  IMPRESSION: Hypoaeration with bibasilar patchy air space opacities and trace effusions which could represent multilobar pneumonia or atelectasis given the degree of hypoaeration. If the patient's symptoms continue, consider PA and lateral chest radiographs obtained at full inspiration when the patient is clinically able.   Original Report Authenticated By: Christiana Pellant, M.D.    Scheduled Meds: . aspirin  81 mg Oral Daily  . azithromycin  500 mg Intravenous Q24H  . cefTRIAXone (ROCEPHIN)  IV  1 g Intravenous Q24H  . fluticasone  2 spray Each Nare Daily  . guaiFENesin  1,200 mg Oral BID  . heparin  5,000 Units Subcutaneous Q8H  . insulin aspart  0-9 Units Subcutaneous TID WC  . senna  1 tablet Oral BID  . simvastatin  20 mg Oral QPM  . sodium chloride  3 mL Intravenous Q12H   Continuous Infusions:   Principal Problem:   Acute encephalopathy Active Problems:   Falls frequently   Diabetes mellitus, type 2   HTN (hypertension)   DVT of right proximal brachial through the distal axillary vein, acute   Hypoglycemia   Community acquired pneumonia    Time spent: 25 min    Calea Hribar  [facility] Pager [phone] If 7PM-7AM, please contact night-coverage at [email or web address], password [email or web address] 10/30/2012, 11:25 AM  LOS: 1 day      Addendum: pt became tachycardic and his EKG Showed WIDE COMPLEX tachycardia. Repeat EKG tonight and follow up on cardiac enzymes.   Kathlen Mody.

## 2012-10-30 NOTE — Progress Notes (Signed)
Pt given Maalox at this time per pt request; will cont. To monitor. 

## 2012-10-30 NOTE — Consult Note (Signed)
WOC consult Note Reason for Consult: Consult requested for bilat leg wounds.  Pt states he has had them "for awhile" and is followed by the outpatient wound care center. Wound type: Several areas of full thickness stasis ulcers. Measurement: Left anterior leg 1.2X1.2X.1cm; 100% red and moist. Right upper anterior leg 5X6X.1cm 100% red and moist. Right inner leg .3X.3X.1cm 100% red and moist. Drainage (amount, consistency, odor) Mod amt yellow drainage, no odor. Periwound:Dry flaking red skin surrounding wounds to bilat legs. Dressing procedure/placement/frequency:Continue present plan of care with Aquacel every other day to provide antimicrobial benefits and absorb drainage.  Foam dressing to protect.  Pt can resume follow-up with outpatient wound care center after discharge. Please re-consult if further assistance is needed.  Thank-you,  Cammie Mcgee MSN, RN, CWOCN, [place], CNS [phone]

## 2012-10-30 NOTE — Progress Notes (Signed)
UR Completed.  Luann Aspinwall Jane [phone] 10/30/2012  

## 2012-10-30 NOTE — Care Management Note (Signed)
    Page 1 of 2   11/09/2012     3:58:50 PM   CARE MANAGEMENT NOTE 11/09/2012  Patient:  [name], [name]   Account Number:  [id number]  Date Initiated:  10/30/2012  Documentation initiated by:  Douglas Rooks  Subjective/Objective Assessment:   PT ADM ON 10/29/12 WITH CONFUSION, HYPOGLYCEMIA, AND PNA. PTA,  PT INDEPENDENT, LIVES WITH SPOUSE.     Action/Plan:   WILL FOLLOW FOR HOME NEEDS AS PT PROGRESSES.   Anticipated DC Date:  11/09/2012   Anticipated DC Plan:  SKILLED NURSING FACILITY  In-house referral  Clinical Social Worker      DC Planning Services  CM consult      Choice offered to / List presented to:             Status of service:  Completed, signed off Medicare Important Message given?   (If response is "NO", the following Medicare IM given date fields will be blank) Date Medicare IM given:   Date Additional Medicare IM given:    Discharge Disposition:  SKILLED NURSING FACILITY  Per UR Regulation:  Reviewed for med. necessity/level of care/duration of stay  If discussed at Long Length of Stay Meetings, dates discussed:   11/06/2012    Comments:  11/09/12 Shantal Roan,RN,BSN [phone] PT DISCHARGED TO [facility] TODAY, PER CSW ARRANGEMENTS.  11/08/12 Brinlyn Cena,RN,BSN  [phone] MET WITH PT WITH CSW IN ATTENDENCE.  DISCUSSED POSSIBLE DC TO SNF WITH PT, AND EXPLAINED THAT WIFE STATES SHE FEELS SHE CANNOT CARE FOR HIM AT HOME IN HIS CURRENT CONDITION. PT UNDERSTANDS THE NEED FOR REHAB AND IS RELUCTANTLY AGREEABLE TO DISCHARGING TO SNF.  WILL DISCONTINUE SITTER TODAY, AND PLAN DC TO [facility] TOMORROW.  11/06/12 Tiffine Henigan,RN,BSN [phone] PT CONT TO BE CONFUSED; RECOMMENDATION IS FOR SNF AT DC. CSW FOLLOWING TO FACILITATE THIS.  CURRENTLY ON MILRINONE DRIP.   WILL CONT TO FOLLOW PROGRESS

## 2012-10-30 NOTE — Progress Notes (Signed)
Pt given Oral Ativan earlier; pt appears to be calmer; HR still ST 129; pt denies pain at this time; will cont. To monitor.

## 2012-10-30 NOTE — Progress Notes (Cosign Needed)
[facility]  NAME:  [name], [name] NO.:  [id number]  MEDICAL RECORD NO.:  [id number]      DATE OF BIRTH:  16-Mar-1941  PHYSICIAN:  Ardath Sax, M.D.           VISIT DATE:                                  OFFICE VISIT   [name] is a 71 year old, diabetic man who is sent here because of venous stasis ulcers on his bilateral legs.  He has already had a toe removed from his right foot.  He was being worked up and found to have a blood sugar of 30.  We immediately gave him 2 cans of Glucerna and several peanut butter crackers, but his blood sugar stayed at 25.  This gentleman had a blood pressure 115/69, a pulse 87, and temperature 97. He weighs 233 pounds.  He treats his diabetes with oral medications and because of his low blood sugar, we sent him immediately to the emergency room, and just simply wrapped his legs in Unna, and we will see him back in a week, but because of his blood sugar, we felt he needed IV glucose.  DIAGNOSES:  Venous stasis ulcers, diabetes, and hypoglycemia.     Ardath Sax, M.D.     PP/MEDQ  D:  10/29/2012  T:  10/30/2012  Job:  [id number]

## 2012-10-31 DIAGNOSIS — E162 Hypoglycemia, unspecified: Secondary | ICD-10-CM

## 2012-10-31 DIAGNOSIS — I379 Nonrheumatic pulmonary valve disorder, unspecified: Secondary | ICD-10-CM

## 2012-10-31 LAB — BASIC METABOLIC PANEL
Chloride: 100 mEq/L (ref 96–112)
Creatinine, Ser: 1.41 mg/dL — ABNORMAL HIGH (ref 0.50–1.35)
GFR calc Af Amer: 56 mL/min — ABNORMAL LOW (ref >90)

## 2012-10-31 LAB — MAGNESIUM: Magnesium: 2.6 mg/dL — ABNORMAL HIGH (ref 1.5–2.5)

## 2012-10-31 LAB — FOLATE: Folate: >20 ng/mL

## 2012-10-31 LAB — TROPONIN I: Troponin I: <0.3 ng/mL (ref <0.30)

## 2012-10-31 LAB — IRON AND TIBC
Iron: 58 ug/dL (ref 42–135)
TIBC: 379 ug/dL (ref 215–435)

## 2012-10-31 LAB — RETICULOCYTES: Retic Count, Absolute: 64.8 10*3/uL (ref 19.0–186.0)

## 2012-10-31 LAB — GLUCOSE, CAPILLARY
Glucose-Capillary: 146 mg/dL — ABNORMAL HIGH (ref 70–99)
Glucose-Capillary: 97 mg/dL (ref 70–99)

## 2012-10-31 LAB — FERRITIN: Ferritin: 53 ng/mL (ref 22–322)

## 2012-10-31 MED ORDER — AZITHROMYCIN 500 MG PO TABS
500.0000 mg | ORAL_TABLET | ORAL | Status: AC
  Administered 2012-10-31 – 2012-11-04 (×5): 500 mg via ORAL
  Filled 2012-10-31 (×5): qty 1

## 2012-10-31 MED ORDER — LORAZEPAM 2 MG/ML IJ SOLN: 0.5000 mg | Freq: Once | INTRAMUSCULAR | Status: DC

## 2012-10-31 MED ORDER — SODIUM CHLORIDE 0.9 % IV SOLN: INTRAVENOUS | Status: AC

## 2012-10-31 MED ORDER — METOPROLOL TARTRATE 25 MG PO TABS
25.0000 mg | ORAL_TABLET | Freq: Every day | ORAL | Status: DC
  Administered 2012-10-31 – 2012-11-01 (×2): 25 mg via ORAL
  Filled 2012-10-31 (×2): qty 1

## 2012-10-31 MED ORDER — METOPROLOL TARTRATE 1 MG/ML IV SOLN
5.0000 mg | Freq: Four times a day (QID) | INTRAVENOUS | Status: DC | PRN
  Administered 2012-10-31: 5 mg via INTRAVENOUS
  Filled 2012-10-31: qty 5

## 2012-10-31 NOTE — Progress Notes (Signed)
Spoke with MRI as to the concern that [name] will not be able to lay completely flat or follow instructions to lay still during MR procedure.  [name] currently has a sitter at bedside and is waxing and waining in bed, requires constant monitoring at this time for safety and IV/ Monitor continuation.  Will continue to re-assess need for sitter and potential for MRI procedure participation.

## 2012-10-31 NOTE — Progress Notes (Signed)
[facility] PROGRESS NOTE  [name] ZOX:[id number] DOB: Aug 29, 1940 DOA: 10/29/2012  PCP: [name], MD  Brief HPI: [name] is a 72 y.o. male with past medical history of diabetes mellitus, hypertension and history of RUE DVT. Patient brought to the hospital because of confusion. He was found to be hypoglycemic with CBG of 25. Initial pulse ox in the emergency department was 75% on room air, with minimal improvement on nasal cannula. Patient was placed on NRB. CBG improved to 96. Patient evaluated by chest x-ray and showed bibasilar pneumonia.  Past medical history:  Past Medical History  Diagnosis Date  . Diabetes mellitus   . Hypertension   . Hyperlipidemia   . Cervical spinal stenosis   . Lumbar spinal stenosis   . 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 DVT (deep venous thrombosis)     RUE 10/2011  . Anemia     Consultants: None  Procedures: None  Antibiotics: Ceftriaxone and Azithromycin 7/28-->  Subjective: Patient confused. Denies any pain per se but has been feeling weak. Per wife, he is very confused and has been so for past 2 months or so. Patient doesn't feel confused.  Objective: Vital Signs  Filed Vitals:   10/30/12 1324 10/30/12 1730 10/30/12 2016 10/31/12 0116  BP: 137/74 146/91 138/95 134/89  Pulse: 88 134 129 133  Temp: 98.4 F (36.9 C)  97.9 F (36.6 C)   TempSrc: Oral  Oral   Resp: 19  20 20   Height:      Weight:      SpO2: 98%  100% 98%    Intake/Output Summary (Last 24 hours) at 10/31/12 1225 Last data filed at 10/31/12 1200  Gross per 24 hour  Intake   1260 ml  Output   1701 ml  Net   -441 ml   Filed Weights   10/29/12 1718  Weight: 101 kg (222 lb 10.6 oz)    General appearance: alert, cooperative, appears stated age, distracted and no distress Head: Normocephalic, without obvious abnormality, atraumatic Eyes: conjunctivae/corneas clear. PERRL, EOM's intact.  Resp: Decreased air entry  at the bases with few crackles. No wheezing. Cardio: regular rate and rhythm, S1, S2 normal, no murmur, click, rub or gallop GI: soft, non-tender; bowel sounds normal; no masses,  no organomegaly Extremities: Wounds noted both legs. No erythema noted. Pulses: 2+ and symmetric Skin: Skin color, texture, turgor normal. No rashes or lesions Neurologic: Alert. Oriented to wife alone. No cranial nerve deficits. No focal motor deficits noted.  Lab Results:  Basic Metabolic Panel:  Recent Labs Lab 10/29/12 1204 10/29/12 1826 10/30/12 0524  NA 140  --  136  K 4.5  --  4.5  CL 103  --  102  CO2 24  --  24  GLUCOSE 104*  --  130*  BUN 35*  --  33*  CREATININE 1.27 1.29 1.29  CALCIUM 10.1  --  9.7   Liver Function Tests:  Recent Labs Lab 10/29/12 1204  AST 33  ALT 17  ALKPHOS 118*  BILITOT 0.7  PROT 7.7  ALBUMIN 3.5   No results found for this basename: LIPASE, AMYLASE,  in the last 168 hours No results found for this basename: AMMONIA,  in the last 168 hours CBC:  Recent Labs Lab 10/29/12 1204 10/29/12 1826 10/30/12 0524  WBC 7.8 5.6 4.8  NEUTROABS 5.9  --   --   HGB 10.1* 10.1* 9.8*  HCT 30.8* 30.4* 31.0*  MCV 89.0 88.6 89.1  PLT 183 172 176   Cardiac Enzymes:  Recent Labs Lab 10/30/12 1733 10/31/12 0018 10/31/12 0440  TROPONINI <0.30 <0.30 <0.30   BNP (last 3 results) No results found for this basename: PROBNP,  in the last 8760 hours CBG:  Recent Labs Lab 10/30/12 1158 10/30/12 1537 10/30/12 1635 10/30/12 2108 10/31/12 1131  GLUCAP 173* 236* 184* 75 107*    No results found for this or any previous visit (from the past 240 hour(s)).    Studies/Results: Dg Chest 2 View  10/29/2012   *RADIOLOGY REPORT*  Clinical Data: Possible pneumonia  CHEST - 2 VIEW  Comparison: 10/29/2012, 10/27/2011  Findings: There is a similarly poor inspiratory effect as on the prior study.  Bilateral lower lobe opacities are stable.  Cardiac silhouette is enlarged.   Probable small bilateral pleural effusions appreciated.  IMPRESSION: Poor inspiratory effect.  Bilateral lower lobe opacities as well as probable small effusions.  Pneumonia is not excluded.   Original Report Authenticated By: Esperanza Heir, M.D.   Ct Head Wo Contrast  10/29/2012   *RADIOLOGY REPORT*  Clinical Data: Altered mental status, hypoglycemia, confusion  CT HEAD WITHOUT CONTRAST  Technique:  Contiguous axial images were obtained from the base of the skull through the vertex without contrast.  Comparison: 11/17/2011  Findings: Mild diffuse cortical volume loss noted with proportional ventricular prominence.  This finding is stable. No acute hemorrhage, acute infarction, or mass lesion is seen.  Right maxillary sinus air fluid level is noted.  No skull fracture. Orbits are unremarkable.  No soft tissue abnormality.  IMPRESSION: No acute intracranial finding.  Right maxillary sinusitis/air fluid level.   Original Report Authenticated By: Christiana Pellant, M.D.   Dg Abd 2 Views  10/30/2012   *RADIOLOGY REPORT*  Clinical Data: Generalized abdominal pain and distention. Constipation.  ABDOMEN - 2 VIEW  Comparison: None.  Findings: Bowel gas pattern unremarkable without evidence of obstruction or significant ileus.  No evidence of free air or significant air fluid levels on the lateral decubitus image.  Large stool burden throughout the colon from cecum to rectum.  No visible opaque urinary tract calculi.  Phleboliths low in the left side of the pelvis.  Degenerative changes involving the lower thoracic and lumbar spine.  Right hip arthroplasty with anatomic alignment.  IMPRESSION: No acute abdominal abnormality.  Large stool burden throughout the colon.   Original Report Authenticated By: Hulan Saas, M.D.    Medications:  Scheduled: . aspirin  81 mg Oral Daily  . azithromycin  500 mg Oral Q24H  . captopril  25 mg Oral BID  . cefTRIAXone (ROCEPHIN)  IV  1 g Intravenous Q24H  . fluticasone  2  spray Each Nare Daily  . furosemide  20 mg Oral BID  . guaiFENesin  1,200 mg Oral BID  . heparin  5,000 Units Subcutaneous Q8H  . insulin aspart  0-9 Units Subcutaneous TID WC  . polyethylene glycol  17 g Oral Daily  . senna-docusate  2 tablet Oral BID  . simvastatin  20 mg Oral QPM  . sodium chloride  3 mL Intravenous Q12H   Continuous:  BJY:NWGNFAOZHYQMV, acetaminophen, albuterol, alum & mag hydroxide-simeth, bisacodyl, HYDROcodone-acetaminophen, magnesium hydroxide, metoprolol, morphine injection, ondansetron (ZOFRAN) IV, ondansetron, simethicone  Assessment/Plan:  Principal Problem:   Acute encephalopathy Active Problems:   Falls frequently   Diabetes mellitus, type 2   HTN (hypertension)   DVT of right proximal brachial through the distal axillary vein, acute  Hypoglycemia   Community acquired pneumonia    Acute encephalopathy  Was thought secondary to hypoglycemia and hypoxia. But hasn't improved despite improvement in glucose levels. Wife states he has been confused for last few months. Will get MRI brain. LFt's are normal. Vit B12 and TSH normal. Check RPR. Could have underlying dementia. Hold sedating agents.  Community acquired pneumonia  Chest x-ray showed bibasilar infiltrates consistent with community acquired pneumonia. Continue ceftriaxone and azithromycin. Supportive management with mucolytics, antitussives and oxygen as needed. Likely the hypoxia secondary to the pneumonia.   Hypoglycemia  In patient was type 2 diabetes mellitus. Patient was on Amaryl. This appears to have resolved. Continue to hold oral agents. A1C is 5.7.   Diabetes mellitus type 2  Started carbohydrate modified diet, SSI. Hemoglobin A1c is 5.7 hold oral hypoglycemics.   Hypoxia  Likely secondary to community acquired pneumonia. This is resolved, patient is on 2 L and his oxygen saturation is 100%. Will assess RA O2 when close to discharge.   Chronic bilateral lower extremity ulcers   Secondary to peripheral vascular disease, patient follows with outpatient wound clinic. No evidence of infection or recent change in the ulcers. Wound care consulted.   History of right upper extremity DVT  Patient was on Xarelto in 2013, not taking it anymore.   Normocytic Anemia Anemia panel reviewed.  DVT Prophylaxis:   Heparin Code Status: full code  Family Communication: Discussed with wife. Disposition Plan: PT recommends SNF.     LOS: 2 days   [facility]  [facility] Pager [phone] 10/31/2012, 12:25 PM  If 8PM-8AM, please contact night-coverage at [email or web address], password [facility]

## 2012-10-31 NOTE — Evaluation (Signed)
Physical Therapy Evaluation Patient Details Name: [name] MRN: [id number] DOB: [birth year] Today's Date: 10/31/2012 Time: 8295-6213 PT Time Calculation (min): 20 min  PT Assessment / Plan / Recommendation History of Present Illness    [name] is a 72 y.o. male with past medical history of diabetes mellitus, hypertension and history of RUE DVT. Patient brought to the hospital because of confusion. Patient was been seen in the wound clinic this morning, he would have altered mental status and EMS was called. He was found to be hypoglycemic with CBG of 25. Patient has to use, dextrose and brought to the emergency department his CBG improved. Initial pulse ox in the emergency department was 75% on room air, with minimal improvement on nasal cannula. Patient was placed in her be the mask. Patient improved very well, and now he is on 2 L of oxygen satting 100%, CBG improved to 96. Patient evaluated by chest x-ray and showed bibasilar pneumonia   Clinical Impression  Pt demonstrates deficits in functional mobility and cognition. Pt is very agitated, confused, and disoriented x3. Patient unreceptive to cues for safety and awareness. Pt with increased anxiety and confabulatory responses to all questions centering around aspects of violence (may benefit from psych consult). Will continue to work with patient and progress activity as tolerated. Rec ST SNF upon discharge pending improvements. Will see as indicated.    PT Assessment  Patient needs continued PT services    Follow Up Recommendations  SNF       Barriers to Discharge   Unconfirmed home environment at this time, unknown support as family unavailable to confirm    Equipment Recommendations  Other (comment) (TBD)    Recommendations for Other Services Other (comment) (pt may benefit from psych consult)   Frequency Min 3X/week    Precautions / Restrictions     Pertinent Vitals/Pain NAD      Mobility  Bed  Mobility Bed Mobility: Not assessed Details for Bed Mobility Assistance: received in [facility] Transfers Transfers: Sit to Stand;Stand to Sit Sit to Stand: 4: Min assist;With armrests;From chair/3-in-1 Stand to Sit: 4: Min assist;With armrests;To chair/3-in-1 Details for Transfer Assistance: VCs for hand placement Ambulation/Gait Ambulation/Gait Assistance: 4: Min assist Ambulation Distance (Feet): 160 Feet Assistive device: Rolling walker Ambulation/Gait Assistance Details: assist for stability and safety, Max VCs for proper use of rolling walker but patient not receptive to cues. Assist to control device.  Gait Pattern: Step-through pattern;Decreased stride length;Trunk flexed Gait velocity: decreased General Gait Details: Patient unsteady with staggering fwd flexed gait, unreceptive to VCs for corrections. Assist required. Unsafe to ambulate without assist        PT Diagnosis: Difficulty walking;Generalized weakness;Altered mental status  PT Problem List: Decreased strength;Decreased activity tolerance;Decreased balance;Decreased mobility;Decreased cognition;Decreased knowledge of use of DME;Decreased safety awareness;Pain PT Treatment Interventions: DME instruction;Gait training;Functional mobility training;Therapeutic activities;Therapeutic exercise;Balance training;Patient/family education     PT Goals(Current goals can be found in the care plan section) Acute Rehab PT Goals Patient Stated Goal: to go home PT Goal Formulation: With patient Time For Goal Achievement: 11/14/12 Potential to Achieve Goals: Fair  Visit Information          Prior Functioning  Home Living Family/patient expects to be discharged to:: Private residence Living Arrangements: Spouse/significant other Available Help at Discharge: Other (Comment) (from other encounter, pt confusion leading to poor historian) Type of Home: House Home Access: Stairs to enter [employer] of Steps: 3 Entrance  Stairs-Rails: None Home Layout: Two level;Able to live  on main level with bedroom/bathroom Home Equipment: Walker - 2 wheels;Bedside commode;Wheelchair - manual Prior Function Comments: unable to determine based on current confusion levels  Communication Communication: HOH Dominant Hand: Left    Cognition  Cognition Arousal/Alertness: Awake/alert Behavior During Therapy: Agitated Overall Cognitive Status: Impaired/Different from baseline Area of Impairment: Orientation;Memory;Following commands;Safety/judgement;Awareness;Problem solving Orientation Level: Disoriented to;Place;Time;Situation Memory: Decreased short-term memory Following Commands: Follows multi-step commands inconsistently Safety/Judgement: Decreased awareness of safety;Decreased awareness of deficits Awareness: Intellectual Problem Solving: Slow processing;Requires verbal cues;Requires tactile cues General Comments: Pt is very confused and confabulating stories of people coming into his room with guns and knives, states "he is in the basement and people keep trying to drug him and take his money"    Extremity/Trunk Assessment Upper Extremity Assessment Upper Extremity Assessment: Overall WFL for tasks assessed Lower Extremity Assessment Lower Extremity Assessment: Generalized weakness   Balance Balance Balance Assessed: Yes Static Standing Balance Static Standing - Balance Support: No upper extremity supported Static Standing - Level of Assistance: 3: Mod assist Static Standing - Comment/# of Minutes: 2 minutes prior to ambulation and 2 minutes during ambulation, patient required physical assist, patient also relied heavily on wall for assist during standing rest breaks High Level Balance High Level Balance Activites: Side stepping;Backward walking;Direction changes;Turns High Level Balance Comments: mod assist required for control of rw and stability  End of Session PT - End of Session Equipment Utilized During  Treatment: Gait belt Activity Tolerance: Treatment limited secondary to agitation Patient left: in chair;with call bell/phone within reach;with nursing/sitter in room Nurse Communication: Mobility status  GP     Fabio Asa 10/31/2012, 8:28 AM Charlotte Crumb, PT DPT  [phone]

## 2012-10-31 NOTE — Progress Notes (Signed)
Pt is very agitated and confused,pulled out IV and removed heart monitor and hospital gown.Hospitalist on call made aware through text,with order/will continue to monitor

## 2012-10-31 NOTE — Progress Notes (Signed)
*  PRELIMINARY RESULTS* Echocardiogram 2D Echocardiogram has been performed.  Jeryl Columbia 10/31/2012, 5:02 PM

## 2012-10-31 NOTE — Progress Notes (Signed)
Discussed with MD pt hr dropping to 40'97s at times, vitals obtained pulse 56, BP 97/62 Afebrile.  Will continue to closely monitor.

## 2012-11-01 ENCOUNTER — Inpatient Hospital Stay (HOSPITAL_COMMUNITY): Payer: Medicare Other

## 2012-11-01 DIAGNOSIS — I420 Dilated cardiomyopathy: Secondary | ICD-10-CM | POA: Clinically undetermined

## 2012-11-01 LAB — COMPREHENSIVE METABOLIC PANEL
AST: 51 U/L — ABNORMAL HIGH (ref 0–37)
Albumin: 3.4 g/dL — ABNORMAL LOW (ref 3.5–5.2)
Calcium: 9.2 mg/dL (ref 8.4–10.5)
Chloride: 101 mEq/L (ref 96–112)
Creatinine, Ser: 1.41 mg/dL — ABNORMAL HIGH (ref 0.50–1.35)
Total Protein: 7.3 g/dL (ref 6.0–8.3)

## 2012-11-01 LAB — GLUCOSE, CAPILLARY
Glucose-Capillary: 108 mg/dL — ABNORMAL HIGH (ref 70–99)
Glucose-Capillary: 190 mg/dL — ABNORMAL HIGH (ref 70–99)
Glucose-Capillary: 133 mg/dL — ABNORMAL HIGH (ref 70–99)

## 2012-11-01 LAB — CBC
MCH: 29.6 pg (ref 26.0–34.0)
MCV: 87.4 fL (ref 78.0–100.0)
Platelets: 202 10*3/uL (ref 150–400)
RDW: 18 % — ABNORMAL HIGH (ref 11.5–15.5)
WBC: 6.3 10*3/uL (ref 4.0–10.5)

## 2012-11-01 MED ORDER — CARVEDILOL 3.125 MG PO TABS: 3.1250 mg | ORAL_TABLET | Freq: Two times a day (BID) | ORAL | Status: DC

## 2012-11-01 MED ORDER — CARVEDILOL 3.125 MG PO TABS
3.1250 mg | ORAL_TABLET | Freq: Two times a day (BID) | ORAL | Status: DC
  Administered 2012-11-02 – 2012-11-07 (×9): 3.125 mg via ORAL
  Filled 2012-11-01 (×13): qty 1

## 2012-11-01 MED ORDER — FUROSEMIDE 10 MG/ML IJ SOLN
40.0000 mg | Freq: Once | INTRAMUSCULAR | Status: AC
  Administered 2012-11-01: 40 mg via INTRAVENOUS
  Filled 2012-11-01: qty 4

## 2012-11-01 NOTE — Progress Notes (Signed)
Clinical Social Work Department BRIEF PSYCHOSOCIAL ASSESSMENT 11/01/2012  Patient:  ISSAAC, [name]     Account Number:  [id number]     Admit date:  10/29/2012  Clinical Social Worker:  Carren Rang  Date/Time:  11/01/2012 02:14 PM  Referred by:  Care Management  Date Referred:  11/01/2012 Referred for  SNF Placement   Other Referral:   Interview type:  Family Other interview type:   CSW spoke with wife, Vernona Rieger on telephone.    PSYCHOSOCIAL DATA Living Status:  WIFE Admitted from facility:   Level of care:   Primary support name:  Loki Wuthrich Primary support relationship to patient:  SPOUSE Degree of support available:   Okay    CURRENT CONCERNS Current Concerns  Post-Acute Placement   Other Concerns:    SOCIAL WORK ASSESSMENT / PLAN CSW spoke with wife, Vernona Rieger on telephone. Wife states she is not very able to take care of him and would prefer SNF. She states he has been to [facility] and prefers that facility. She stated that she does not want husband to go to [facility] because he was treated poorly by them before. Wife stated that her husband has a lot of confusion.   Assessment/plan status:  Psychosocial Support/Ongoing Assessment of Needs Other assessment/ plan:   Information/referral to community resources:   SNF    PATIENT'S/FAMILY'S RESPONSE TO PLAN OF CARE: Wife states she can no longer take care of her husband and prefers him to be at SNF, specifically [facility]. Wife states that husband is very confused.       Maree Krabbe, MSW, Theresia Majors [phone]

## 2012-11-01 NOTE — Progress Notes (Signed)
Clinical Social Work Department CLINICAL SOCIAL WORK PLACEMENT NOTE 11/01/2012  Patient:  [name], [name]  Account Number:  [id number] Admit date:  10/29/2012  Clinical Social Worker:  Carren Rang  Date/time:  11/01/2012 02:19 PM  Clinical Social Work is seeking post-discharge placement for this patient at the following level of care:   SKILLED NURSING   (*CSW will update this form in Epic as items are completed)   11/01/2012  Patient/family provided with [facility] Department of Clinical Social Work's list of facilities offering this level of care within the geographic area requested by the patient (or if unable, by the patient's family).  11/01/2012  Patient/family informed of their freedom to choose among providers that offer the needed level of care, that participate in Medicare, Medicaid or managed care program needed by the patient, have an available bed and are willing to accept the patient.  11/01/2012  Patient/family informed of [facility]' ownership interest in [facility], as well as of the fact that they are under no obligation to receive care at this facility.  PASARR submitted to EDS on 11/01/2012 PASARR number received from [employer] on 11/01/2012  FL2 transmitted to all facilities in geographic area requested by pt/family on  11/01/2012 FL2 transmitted to all facilities within larger geographic area on   Patient informed that his/her managed care company has contracts with or will negotiate with  certain facilities, including the following:     Patient/family informed of bed offers received:   Patient chooses bed at  Physician recommends and patient chooses bed at    Patient to be transferred to  on   Patient to be transferred to facility by   The following physician request were entered in Epic:   Additional Comments:  Maree Krabbe, MSW, [employer] [phone]

## 2012-11-01 NOTE — Progress Notes (Addendum)
[facility] PROGRESS NOTE  [name] ZOX:[id number] DOB: [birth year] DOA: 10/29/2012  PCP: [name], MD  Brief HPI: [name] is a 72 y.o. male with past medical history of diabetes mellitus, hypertension and history of RUE DVT. Patient brought to the hospital because of confusion. He was found to be hypoglycemic with CBG of 25. Initial pulse ox in the emergency department was 75% on room air, with minimal improvement on nasal cannula. Patient was placed on NRB. CBG improved to 96. Patient evaluated by chest x-ray and showed bibasilar pneumonia.  Past medical history:  Past Medical History  Diagnosis Date  . Diabetes mellitus   . Hypertension   . Hyperlipidemia   . Cervical spinal stenosis   . Lumbar spinal stenosis   . 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 DVT (deep venous thrombosis)     RUE 10/2011  . Anemia     Consultants: None  Procedures:  2D ECHo 7/30 Study Conclusions  - Left ventricle: The cavity size was severely dilated. Wall thickness was normal. Systolic function was severely reduced. The estimated ejection fraction was in the range of 20% to 25%. Diffuse hypokinesis. - Left atrium: The atrium was moderately dilated. - Right ventricle: The cavity size was moderately dilated. - Right atrium: The atrium was moderately dilated. - Pulmonary arteries: PA peak pressure: 34mm Hg (S). - Pericardium, extracardiac: There was a left pleuraleffusion.  Antibiotics: Ceftriaxone and Azithromycin 7/28-->  Subjective: Patient continues to be confused. Denies any pain per se. Per wife, he is very confused and has been so for past 2 months or so.  Objective: Vital Signs  Filed Vitals:   10/31/12 1313 10/31/12 1600 10/31/12 1944 11/01/12 0356  BP: 125/88 97/62 142/95 150/85  Pulse: 123 56 93 65  Temp: 98 F (36.7 C) 98.2 F (36.8 C) 97.4 F (36.3 C) 97.4 F (36.3 C)  TempSrc: Oral Oral Oral Oral  Resp: 19 16 18 22    Height:      Weight:      SpO2: 96% 100% 100% 100%    Intake/Output Summary (Last 24 hours) at 11/01/12 1057 Last data filed at 11/01/12 0604  Gross per 24 hour  Intake  670.5 ml  Output    200 ml  Net  470.5 ml   Filed Weights   10/29/12 1718  Weight: 101 kg (222 lb 10.6 oz)    General appearance: alert, cooperative, appears stated age, distracted and no distress Head: Normocephalic, without obvious abnormality, atraumatic Resp: Decreased air entry at the bases with few crackles. No wheezing. Cardio: regular rate and rhythm, S1, S2 normal, no murmur, click, rub or gallop GI: soft, non-tender; bowel sounds normal; no masses,  no organomegaly Extremities: Wounds noted both legs. No erythema noted. Edema 2+ Pulses: 2+ and symmetric Neurologic: Alert. Today he knew he was in [facility]. Oriented to wife. No cranial nerve deficits. No focal motor deficits noted.  Lab Results:  Basic Metabolic Panel:  Recent Labs Lab 10/29/12 1204 10/29/12 1826 10/30/12 0524 10/31/12 1421 11/01/12 0822  NA 140  --  136 133* 136  K 4.5  --  4.5 5.1 5.1  CL 103  --  102 100 101  CO2 24  --  24 24 24   GLUCOSE 104*  --  130* 99 181*  BUN 35*  --  33* 37* 43*  CREATININE 1.27 1.29 1.29 1.41* 1.41*  CALCIUM 10.1  --  9.7 9.5 9.2  MG  --   --   --  2.6*  --    Liver Function Tests:  Recent Labs Lab 10/29/12 1204 11/01/12 0822  AST 33 51*  ALT 17 30  ALKPHOS 118* 109  BILITOT 0.7 0.7  PROT 7.7 7.3  ALBUMIN 3.5 3.4*   No results found for this basename: LIPASE, AMYLASE,  in the last 168 hours No results found for this basename: AMMONIA,  in the last 168 hours CBC:  Recent Labs Lab 10/29/12 1204 10/29/12 1826 11/01/12 0524 11/01/12 0822  WBC 7.8 5.6 4.8 6.3  NEUTROABS 5.9  --   --   --   HGB 10.1* 10.1* 9.8* 11.5*  HCT 30.8* 30.4* 31.0* 34.0*  MCV 89.0 88.6 89.1 87.4  PLT 183 172 176 202   Cardiac Enzymes:  Recent Labs Lab 2012/11/01 1733 10/31/12 0018 10/31/12 0440   TROPONINI <0.30 <0.30 <0.30   BNP (last 3 results) No results found for this basename: PROBNP,  in the last 8760 hours CBG:  Recent Labs Lab 10/31/12 0858 10/31/12 1131 10/31/12 1539 10/31/12 2105 11/01/12 0601  GLUCAP 157* 107* 79 97 108*    No results found for this or any previous visit (from the past 240 hour(s)).    Studies/Results: Dg Abd 2 Views  2012-11-01   *RADIOLOGY REPORT*  Clinical Data: Generalized abdominal pain and distention. Constipation.  ABDOMEN - 2 VIEW  Comparison: None.  Findings: Bowel gas pattern unremarkable without evidence of obstruction or significant ileus.  No evidence of free air or significant air fluid levels on the lateral decubitus image.  Large stool burden throughout the colon from cecum to rectum.  No visible opaque urinary tract calculi.  Phleboliths low in the left side of the pelvis.  Degenerative changes involving the lower thoracic and lumbar spine.  Right hip arthroplasty with anatomic alignment.  IMPRESSION: No acute abdominal abnormality.  Large stool burden throughout the colon.   Original Report Authenticated By: Hulan Saas, M.D.    Medications:  Scheduled: . aspirin  81 mg Oral Daily  . azithromycin  500 mg Oral Q24H  . captopril  25 mg Oral BID  . cefTRIAXone (ROCEPHIN)  IV  1 g Intravenous Q24H  . fluticasone  2 spray Each Nare Daily  . furosemide  40 mg Intravenous Once  . furosemide  20 mg Oral BID  . guaiFENesin  1,200 mg Oral BID  . heparin  5,000 Units Subcutaneous Q8H  . insulin aspart  0-9 Units Subcutaneous TID WC  . metoprolol tartrate  25 mg Oral Daily  . polyethylene glycol  17 g Oral Daily  . senna-docusate  2 tablet Oral BID  . simvastatin  20 mg Oral QPM  . sodium chloride  3 mL Intravenous Q12H   Continuous:  XBJ:[id number], acetaminophen, albuterol, alum & mag hydroxide-simeth, bisacodyl, HYDROcodone-acetaminophen, magnesium hydroxide, metoprolol, morphine injection, ondansetron (ZOFRAN) IV,  ondansetron, simethicone  Assessment/Plan:  Principal Problem:   Acute encephalopathy Active Problems:   Falls frequently   Diabetes mellitus, type 2   HTN (hypertension)   DVT of right proximal brachial through the distal axillary vein, acute   Hypoglycemia   Community acquired pneumonia    Acute encephalopathy  Was thought secondary to hypoglycemia and hypoxia. But hasn't improved despite improvement in glucose levels. Wife states he has been confused for last few months. Await MRI brain. LFt's are normal. Vit B12 and TSH normal. RPR is pending. Could have underlying dementia. Hold sedating agents.  Newly detected Dilated Cardiomyopathy EF is found to be 20% on ECHO with  diffuse hypokinesis. Hasn't been diagnosed before. Could explain EKG abnormalities. Will likely need to involve cardiology. Will await MRi and discuss with his wife first. Has not been seen by cardiology based on Epic notes. Since he has edema will initiate Lasix. CXR had shown bilateral infiltrates which was thought to be pneumonia. But could have been edema as well. Check BNP. HR stable today. Yesterday he was given betablocker with drop in HR to 50's. Can consider low dose Coreg instead of Metoprolol. He is on a ACEI. Monitor renal function closely.   Community acquired pneumonia  Chest x-ray showed bibasilar infiltrates which was thought to be consistent with community acquired pneumonia. See above. Will continue ceftriaxone and azithromycin for now.   Hypoglycemia  In patient was type 2 diabetes mellitus. Patient was on Amaryl. This appears to have resolved. Continue to hold oral agents. A1C is 5.7.   Diabetes mellitus type 2  Started carbohydrate modified diet, SSI. Hemoglobin A1c is 5.7 hold oral hypoglycemics.   Hypoxia  Likely secondary to community acquired pneumonia versus CHF. This is resolved, patient is on 2 L and his oxygen saturation is 100%. Will assess RA O2 when close to discharge.   Chronic  bilateral lower extremity ulcers  Secondary to peripheral vascular disease, patient follows with outpatient wound clinic. No evidence of infection or recent change in the ulcers. Wound care consulted. Diuretics.  History of right upper extremity DVT  Patient was on Xarelto in 2013, not taking it anymore.   Normocytic Anemia Anemia panel reviewed.  DVT Prophylaxis:   Heparin Code Status: full code  Family Communication: No family at bedside today.. Disposition Plan: PT recommends SNF.     LOS: 3 days   [facility]  [facility] Pager [phone] 11/01/2012, 10:57 AM  If 8PM-8AM, please contact night-coverage at [email or web address], password [facility]

## 2012-11-01 NOTE — Progress Notes (Signed)
Pt would not allow the phlebotomist to draw his morning labs. The pt was combative and verbally abusive. [name]

## 2012-11-02 ENCOUNTER — Inpatient Hospital Stay (HOSPITAL_COMMUNITY): Payer: Medicare Other

## 2012-11-02 ENCOUNTER — Encounter (HOSPITAL_COMMUNITY): Payer: Self-pay | Admitting: Nurse Practitioner

## 2012-11-02 DIAGNOSIS — I5023 Acute on chronic systolic (congestive) heart failure: Principal | ICD-10-CM

## 2012-11-02 DIAGNOSIS — I509 Heart failure, unspecified: Secondary | ICD-10-CM

## 2012-11-02 LAB — GLUCOSE, CAPILLARY
Glucose-Capillary: 126 mg/dL — ABNORMAL HIGH (ref 70–99)
Glucose-Capillary: 134 mg/dL — ABNORMAL HIGH (ref 70–99)

## 2012-11-02 LAB — CBC
MCH: 29.2 pg (ref 26.0–34.0)
MCV: 88.6 fL (ref 78.0–100.0)
Platelets: 208 10*3/uL (ref 150–400)
RDW: 17.9 % — ABNORMAL HIGH (ref 11.5–15.5)

## 2012-11-02 LAB — BASIC METABOLIC PANEL
BUN: 42 mg/dL — ABNORMAL HIGH (ref 6–23)
CO2: 20 mEq/L (ref 19–32)
Calcium: 9.4 mg/dL (ref 8.4–10.5)
Creatinine, Ser: 1.28 mg/dL (ref 0.50–1.35)
GFR calc Af Amer: 63 mL/min — ABNORMAL LOW (ref >90)

## 2012-11-02 LAB — SEDIMENTATION RATE: Sed Rate: 10 mm/hr (ref 0–16)

## 2012-11-02 MED ORDER — FUROSEMIDE 10 MG/ML IJ SOLN
40.0000 mg | Freq: Two times a day (BID) | INTRAMUSCULAR | Status: DC
  Administered 2012-11-02: 40 mg via INTRAVENOUS

## 2012-11-02 MED ORDER — QUETIAPINE 12.5 MG HALF TABLET
12.5000 mg | ORAL_TABLET | Freq: Every day | ORAL | Status: DC
  Administered 2012-11-02 – 2012-11-06 (×5): 12.5 mg via ORAL
  Filled 2012-11-02 (×6): qty 1

## 2012-11-02 MED ORDER — FUROSEMIDE 10 MG/ML IJ SOLN
40.0000 mg | Freq: Three times a day (TID) | INTRAMUSCULAR | Status: DC
  Administered 2012-11-02 – 2012-11-04 (×6): 40 mg via INTRAVENOUS
  Filled 2012-11-02 (×11): qty 4

## 2012-11-02 MED ORDER — FUROSEMIDE 10 MG/ML IJ SOLN
INTRAMUSCULAR | Status: AC
  Filled 2012-11-02: qty 4

## 2012-11-02 MED ORDER — LISINOPRIL 5 MG PO TABS
5.0000 mg | ORAL_TABLET | Freq: Two times a day (BID) | ORAL | Status: DC
  Administered 2012-11-02 (×2): 5 mg via ORAL
  Filled 2012-11-02 (×4): qty 1

## 2012-11-02 MED ORDER — CEFPODOXIME PROXETIL 200 MG PO TABS
200.0000 mg | ORAL_TABLET | Freq: Two times a day (BID) | ORAL | Status: AC
  Administered 2012-11-02 – 2012-11-05 (×8): 200 mg via ORAL
  Filled 2012-11-02 (×9): qty 1

## 2012-11-02 NOTE — Progress Notes (Addendum)
[facility] PROGRESS NOTE  [name] ZOX:[id number] DOB: [birth year] DOA: 10/29/2012  PCP: [name], MD  Brief HPI: [name] is a 72 y.o. male with past medical history of diabetes mellitus, hypertension and history of RUE DVT. Patient brought to the hospital because of confusion. He was found to be hypoglycemic with CBG of 25. Initial pulse ox in the emergency department was 75% on room air, with minimal improvement on nasal cannula. Patient was placed on NRB. CBG improved to 96. Patient evaluated by chest x-ray and showed bibasilar pneumonia.  Past medical history:  Past Medical History  Diagnosis Date  . Diabetes mellitus   . Hypertension   . Hyperlipidemia   . Cervical spinal stenosis   . Lumbar spinal stenosis   . 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 DVT (deep venous thrombosis)     RUE 10/2011  . Anemia     Consultants: None  Procedures:  2D ECHO 7/30 Study Conclusions  - Left ventricle: The cavity size was severely dilated. Wall thickness was normal. Systolic function was severely reduced. The estimated ejection fraction was in the range of 20% to 25%. Diffuse hypokinesis. - Left atrium: The atrium was moderately dilated. - Right ventricle: The cavity size was moderately dilated. - Right atrium: The atrium was moderately dilated. - Pulmonary arteries: PA peak pressure: 34mm Hg (S). - Pericardium, extracardiac: There was a left pleuraleffusion.  Antibiotics: Ceftriaxone 7/28-->8/1 Azithromycin 7/28--> Vantin 8/1-->  Subjective: Patient denies any pain. Says he cannot lie down flat as he gets short of breath. Continues to be confused. Denies any pain per se.   Objective: Vital Signs  Filed Vitals:   11/01/12 0356 11/01/12 1415 11/01/12 1943 11/02/12 0400  BP: 150/85 127/80 135/89 156/89  Pulse: 65 58 59 80  Temp: 97.4 F (36.3 C) 97.6 F (36.4 C) 98.3 F (36.8 C) 97.5 F (36.4 C)  TempSrc: Oral Oral Oral Oral   Resp: 22 20 20 20   Height:      Weight:      SpO2: 100% 100% 100% 100%    Intake/Output Summary (Last 24 hours) at 11/02/12 1012 Last data filed at 11/02/12 0730  Gross per 24 hour  Intake    363 ml  Output   1800 ml  Net  -1437 ml   Filed Weights   10/29/12 1718  Weight: 101 kg (222 lb 10.6 oz)    General appearance: alert, cooperative, appears stated age, distracted and no distress Head: Normocephalic, without obvious abnormality, atraumatic Resp: Decreased air entry at the bases with few crackles. No wheezing. Cardio: regular rate and rhythm, S1, S2 normal, no murmur, click, rub or gallop GI: soft, non-tender; bowel sounds normal; no masses,  no organomegaly Extremities: Wounds noted both legs. No erythema noted. Edema 2+ Pulses: 2+ and symmetric Neurologic: Alert. He knew he was in [place]. Oriented to wife. Not oriented to time, date, day, month, year. No cranial nerve deficits. No focal motor deficits noted.  Lab Results:  Basic Metabolic Panel:  Recent Labs Lab 10/29/12 1204 10/29/12 1826 10/30/12 0524 10/31/12 1421 11/01/12 0822 11/02/12 0545  NA 140  --  136 133* 136 136  K 4.5  --  4.5 5.1 5.1 4.8  CL 103  --  102 100 101 102  CO2 24  --  24 24 24 20   GLUCOSE 104*  --  130* 99 181* 133*  BUN 35*  --  33* 37* 43* 42*  CREATININE 1.27 1.29 1.29 1.41* 1.41* 1.28  CALCIUM 10.1  --  9.7 9.5 9.2 9.4  MG  --   --   --  2.6*  --   --    Liver Function Tests:  Recent Labs Lab 10/29/12 1204 11/01/12 0822  AST 33 51*  ALT 17 30  ALKPHOS 118* 109  BILITOT 0.7 0.7  PROT 7.7 7.3  ALBUMIN 3.5 3.4*   CBC:  Recent Labs Lab 10/29/12 1204 10/29/12 1826 10/30/12 0524 11/01/12 0822 11/02/12 0545  WBC 7.8 5.6 4.8 6.3 6.0  NEUTROABS 5.9  --   --   --   --   HGB 10.1* 10.1* 9.8* 11.5* 11.5*  HCT 30.8* 30.4* 31.0* 34.0* 34.9*  MCV 89.0 88.6 89.1 87.4 88.6  PLT 183 172 176 202 208   Cardiac Enzymes:  Recent Labs Lab 10/30/12 1733 10/31/12 0018  10/31/12 0440  TROPONINI <0.30 <0.30 <0.30   BNP (last 3 results)  Recent Labs  11/01/12 0822  PROBNP 13622.0*   CBG:  Recent Labs Lab 11/01/12 0601 11/01/12 1122 11/01/12 1618 11/01/12 2052 11/02/12 0546  GLUCAP 108* 190* 157* 133* 126*    No results found for this or any previous visit (from the past 240 hour(s)).    Studies/Results: No results found.  Medications:  Scheduled: . aspirin  81 mg Oral Daily  . azithromycin  500 mg Oral Q24H  . captopril  25 mg Oral BID  . carvedilol  3.125 mg Oral BID WC  . cefTRIAXone (ROCEPHIN)  IV  1 g Intravenous Q24H  . fluticasone  2 spray Each Nare Daily  . guaiFENesin  1,200 mg Oral BID  . heparin  5,000 Units Subcutaneous Q8H  . insulin aspart  0-9 Units Subcutaneous TID WC  . polyethylene glycol  17 g Oral Daily  . senna-docusate  2 tablet Oral BID  . simvastatin  20 mg Oral QPM  . sodium chloride  3 mL Intravenous Q12H   Continuous:  GNF:[id number], acetaminophen, albuterol, alum & mag hydroxide-simeth, bisacodyl, HYDROcodone-acetaminophen, magnesium hydroxide, morphine injection, ondansetron (ZOFRAN) IV, ondansetron, simethicone  Assessment/Plan:  Principal Problem:   Acute encephalopathy Active Problems:   Falls frequently   Diabetes mellitus, type 2   HTN (hypertension)   DVT of right proximal brachial through the distal axillary vein, acute   Hypoglycemia   Community acquired pneumonia   Dilated cardiomyopathy    Acute encephalopathy  Was thought secondary to hypoglycemia and hypoxia. But hasn't improved despite improvement in glucose levels. Wife states he has been confused for last few months. MRI brain could not be done due to dyspnea. Will consult neurology. LFt's are normal. Vit B12 and TSH normal. RPR is non reactive. Could have underlying dementia. Holding sedating agents. Try seroquel. Last EKG does not show prolonged Qt.  Newly detected Dilated Cardiomyopathy EF is found to be 20% on ECHO  with diffuse hypokinesis. Hasn't been diagnosed before. Could explain EKG abnormalities. Discussed with wife. Will consult cardiology. No history of same in past. He diuresed well after Lasix. Will give another dose IV today. Will need scheduled Lasix on discharge. CXR had shown bilateral infiltrates which was thought to be pneumonia. But could have been edema as well[place] [address]. HR stable. Tolerating Coreg well. He is on a ACEI. Monitor renal function closely.   Community acquired pneumonia  Chest x-ray showed bibasilar infiltrates which was thought to be consistent with community acquired pneumonia. See above. Change to oral antibiotics.  Hypoglycemia  Resolved. Patient was on Amaryl. Continue to hold oral hypoglycemic agents. A1C is 5.7.   Diabetes mellitus type 2  Started carbohydrate modified diet, SSI. Hemoglobin A1c is 5.7.   Hypoxia  Likely secondary to community acquired pneumonia versus CHF. This is improved, patient is on 2 L and his oxygen saturation is 100%. Will assess RA O2 when close to discharge.   Chronic bilateral lower extremity ulcers  Secondary to peripheral vascular disease, patient follows with outpatient wound clinic. No evidence of infection or recent change in the ulcers. Wound care consulted. Diuretics.  History of right upper extremity DVT  Patient was on Xarelto in 2013, not taking it anymore.   Normocytic Anemia Anemia panel reviewed.  DVT Prophylaxis:   Heparin Code Status: full code  Family Communication: Discussed with wife over the phone. Disposition Plan: PT recommends SNF.     LOS: 4 days   [facility]  [facility] Pager [phone] 11/02/2012, 10:12 AM  If 8PM-8AM, please contact night-coverage at [email or web address], password [facility]

## 2012-11-02 NOTE — Consult Note (Signed)
CARDIOLOGY CONSULT NOTE  Patient ID: [name] MRN: [id number], DOB/AGE: 07/01/1940   Admit date: 10/29/2012 Date of Consult: 11/02/2012  Primary Physician: [name], MD Primary Cardiologist: new to [place]  Pt. Profile  72 yo male with history of DM, HTN, hyperlipidemia, and DVT (RUE). He presented to the ED on 7/28 with altered mental status felt to be d/t hypoglycemia. Newly discovered cardiomyopathy EF 20% on this admission. Denies CP or SOB.   Problem List  Past Medical History  Diagnosis Date  . Diabetes mellitus   . Hypertension   . Hyperlipidemia   . Cervical spinal stenosis   . Lumbar spinal stenosis   . 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 DVT (deep venous thrombosis)     RUE 10/2011  . Anemia   . Cardiomyopathy     a. 10/2012 Echo: EF 20-25%, diff HK, mod dil LA/RV/RA.    Past Surgical History  Procedure Laterality Date  . Total hip arthroplasty    . Toe amputation    . Anterior cervical decomp/discectomy fusion  10/31/2011    Procedure: ANTERIOR CERVICAL DECOMPRESSION/DISCECTOMY FUSION 2 LEVELS;  Surgeon: Cristi Loron, MD;  Location: [facility];  Service: Neurosurgery;  Laterality: N/A;  Cervical Four-Five Cervical Five-Six Anterior Cervical Decompression with fusion interbody prothesis plating and bonegraft     Allergies  No Known Allergies  HPI  72 yo male with history of DM, HTN, hyperlipidemia, and DVT (RUE). He presented to the ED on 7/28 with altered mental status felt to be d/t hypoglycemia and CAP, which he is being treated with antibiotics. Over the last couple of months and during his hospital stay he has had bouts of confusion. MRI of the brain was ordered, but could not be obtained d/t dyspnea. He has experienced times when he is bradycardiac in the 50's and then has been tachycardic in the 120's. TSH nl and pro-BNP elevated 13,622. An ECHO was obtained which showed an EF 20-25% with diffuse hypokinesis, RV mod  dilated, and mild MR. We have been asked to evlauated d/t newly diagnosed cardiomyopathy.  Inpatient Medications  . aspirin  81 mg Oral Daily  . azithromycin  500 mg Oral Q24H  . captopril  25 mg Oral BID  . carvedilol  3.125 mg Oral BID WC  . cefpodoxime  200 mg Oral Q12H  . fluticasone  2 spray Each Nare Daily  . furosemide      . furosemide  40 mg Intravenous Q12H  . guaiFENesin  1,200 mg Oral BID  . heparin  5,000 Units Subcutaneous Q8H  . insulin aspart  0-9 Units Subcutaneous TID WC  . polyethylene glycol  17 g Oral Daily  . QUEtiapine  12.5 mg Oral QHS  . senna-docusate  2 tablet Oral BID  . simvastatin  20 mg Oral QPM  . sodium chloride  3 mL Intravenous Q12H    Family History Family History  Problem Relation Age of Onset  . Diabetes Brother   . Cancer Father     Lung cancer     Social History History   Social History  . Marital Status: Married    Spouse Name: N/A    Number of Children: N/A  . Years of Education: N/A   Occupational History  . Not on file.   Social History Main Topics  . Smoking status: Never Smoker   . Smokeless tobacco: Not on file  . Alcohol Use: No  . Drug  Use: Not on file  . Sexually Active: Not on file   Other Topics Concern  . Not on file   Social History Narrative  . No narrative on file     Review of Systems  General:  No chills, fever, night sweats or weight changes.  Cardiovascular:  No chest pain, +++dyspnea on exertion,+++ edema, +++orthopnea, palpitations, paroxysmal nocturnal dyspnea. Dermatological: No rash, lesions/masses Respiratory: +++ cough, dyspnea Urologic: No hematuria, dysuria Abdominal:   No nausea, vomiting, diarrhea, bright red blood per rectum, melena, or hematemesis Neurologic:  No visual changes, wkns, changes in mental status. All other systems reviewed and are otherwise negative except as noted above.  Physical Exam  Blood pressure 156/89, pulse 80, temperature 97.5 F (36.4 C), temperature  source Oral, resp. rate 20, height 5\' 10"  (1.778 m), weight 222 lb 10.6 oz (101 kg), SpO2 100.00%.  General: Pleasant, NAD; sitting in chair Psych: Normal affect. Neuro: Alert and oriented to person, place and situation; can't remember time. Moves all extremities spontaneously. HEENT: Normal  Neck: Supple without bruits; JVP 14-16 cm with dilated EJ Lungs:  Resp regular and unlabored, diminished in the bases Heart: RRR no s3, s4, or murmurs. Abdomen: Soft, non-tender, non-distended, BS + x 4.  Extremities: No clubbing, cyanosis R>L hands;   2-3+ edema to knees bilaterally. DP/PT/Radials 2+ and equal bilaterally. Multiple wounds on legs with dressings applied; erythema and warm to touch  Labs   Recent Labs  10/30/12 1733 10/31/12 0018 10/31/12 0440  TROPONINI <0.30 <0.30 <0.30   Lab Results  Component Value Date   WBC 6.0 11/02/2012   HGB 11.5* 11/02/2012   HCT 34.9* 11/02/2012   MCV 88.6 11/02/2012   PLT 208 11/02/2012     Recent Labs Lab 11/01/12 0822 11/02/12 0545  NA 136 136  K 5.1 4.8  CL 101 102  CO2 24 20  BUN 43* 42*  CREATININE 1.41* 1.28  CALCIUM 9.2 9.4  PROT 7.3  --   BILITOT 0.7  --   ALKPHOS 109  --   ALT 30  --   AST 51*  --   GLUCOSE 181* 133*   No results found for this basename: CHOL,  HDL,  LDLCALC,  TRIG   No results found for this basename: DDIMER    Radiology/Studies  Dg Chest 2 View  10/29/2012   *RADIOLOGY REPORT*  Clinical Data: Possible pneumonia  CHEST - 2 VIEW  Comparison: 10/29/2012, 10/27/2011  .  IMPRESSION: Poor inspiratory effect.  Bilateral lower lobe opacities as well as probable small effusions.  Pneumonia is not excluded.   Original Report Authenticated By: Esperanza Heir, M.D.   Ct Head Wo Contrast  10/29/2012   *RADIOLOGY REPORT*  Clinical Data: Altered mental status, hypoglycemia, confusion  CT HEAD WITHOUT CONTRAST  Technique:    IMPRESSION: No acute intracranial finding.  Right maxillary sinusitis/air fluid level.   Original  Report Authenticated By: Christiana Pellant, M.D.   Dg Chest Port 1 View  10/29/2012   *RADIOLOGY REPORT*  Clinical Data: Shortness of breath  PORTABLE CHEST - 1 VIEW  Comparison: 10/27/2011  IMPRESSION: Hypoaeration with bibasilar patchy air space opacities and trace effusions which could represent multilobar pneumonia or atelectasis given the degree of hypoaeration. If the patient's symptoms continue, consider PA and lateral chest radiographs obtained at full inspiration when the patient is clinically able.   Original Report Authenticated By: Christiana Pellant, M.D.   Dg Abd 2 Views  10/30/2012   *RADIOLOGY REPORT*  Clinical  Data: Generalized abdominal pain and distention. Constipation.  ABDOMEN - 2 VIEW  Comparison: None.  IMPRESSION: No acute abdominal abnormality.  Large stool burden throughout the colon.   Original Report Authenticated By: Hulan Saas, M.D.    ECG  NSR with LBBB 70's  ASSESSMENT AND PLAN  1) A/C systolic HF, EF 20-25%. 72 yo male who presented to the hospital with altered mental status and during his admission was found to have a low EF. Patient does not have prior cardiac history, however does have HTN and diabetes. TSH nl. Reviewed his telemetry and patient has experienced episodes of ST and bradycardia. Patient slightly confused and reports gets exertional SOB at home. Appears to be volume overloaded, will increase lasix to 40 mg IV every 8 hrs and follow Cr closely. Place order for daily weights. SBP 130-150's will try to increase coreg 6.25 mg BID. Will change catopril to lisinopril 5 mg BID.Marland Kitchen  Patient will need LHC to determine if ischemic in nature, will do RHC as well to assess CI d/t recent confusion.  2) DM: continue SSI; IM addressing. On ACE-I and statin  3) CAP: on antibiotics, DOE could be slightly r/t to this issue, but more likely d/t volume overload.  4) Bilateral lower extremity ulcers: continue recommendations from [facility]OC.  5) HTN: SBP 130-150s; continue  ACE-I and BB.  Signed, Nicolasa Ducking, NP 11/02/2012, 12:57 PM  Patient seen with NP, agree with the above note.  1. Acute on chronic systolic CHF: EF 40-34% on echo, unknown cause of CMP.  Has chronic LBBB.  He is very volume overloaded on exam.  Extremities are cool, suggesting possibility of low output.   - Increase Lasix to 40 mg IV every 8 hrs - Change from captopril to lisinopril 5 mg bid.  - Will keep Coreg at 3.125 mg bid given acute CHF.  - Will plan for LHC/RHC on Monday or Tuesday if creatinine remains stable to assess for CAD and also to assess hemodynamics.  2. PNA: Treating for PNA per [facility].   3. Altered mental status: Initially in setting of hypoglycemia.  Would also be concerned that low output heart failure is playing a role.  Neurology involved.  He seems pretty clear today.   Marca Ancona 11/02/2012 3:59 PM

## 2012-11-02 NOTE — Consult Note (Signed)
NEURO HOSPITALIST CONSULT NOTE    Reason for Consult: 3 month history of declining memory  HPI:                                                                                                                                          [name] is an 72 y.o. male brought to the hospital because of confusion. Wile at the wound clinic he was found to have CBG of 25 and initial pulse ox in the emergency department was 75% on room air, with minimal improvement on nasal cannula but returned to 100 % on mask. Marland Kitchen Neurology was called due to concern for 3 months or greater decrease in memory.  Patient admits to having difficulty with memory but his history is not reliable.  He states he forgets things from time to time but cannot tell me when he had his hand surgery and his thought process is tangential going back and forth between two separate surgeries which had no correlation. Initially he stated he has not driven for 10 years but later stated is has been since last October.  He states his wife does all the accounting "because he got sick of dealing with it".     Since hospitalization his Glucose has been normalized, Calcium and sodium has been normal.  B12, Folate and TSH normal. RPR is non reactive.CT head shows mild diffuse cortical volume loss but no acute infarct or bleed.   MRI not attainable due to dyspnea when laying flat.   Past Medical History  Diagnosis Date  . Diabetes mellitus   . Hypertension   . Hyperlipidemia   . Cervical spinal stenosis   . Lumbar spinal stenosis   . 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 DVT (deep venous thrombosis)     RUE 10/2011  . Anemia   . Cardiomyopathy     a. 10/2012 Echo: EF 20-25%, diff HK, mod dil LA/RV/RA.    Past Surgical History  Procedure Laterality Date  . Total hip arthroplasty    . Toe amputation    . Anterior cervical decomp/discectomy fusion  10/31/2011    Procedure: ANTERIOR CERVICAL  DECOMPRESSION/DISCECTOMY FUSION 2 LEVELS;  Surgeon: Cristi Loron, MD;  Location: [facility];  Service: Neurosurgery;  Laterality: N/A;  Cervical Four-Five Cervical Five-Six Anterior Cervical Decompression with fusion interbody prothesis plating and bonegraft  . Carpal tunnel release      Family History  Problem Relation Age of Onset  . Diabetes Brother   . Cancer Father     Lung cancer     Social History:  reports that he has never smoked. He does not have any smokeless tobacco history on file. He reports that he does not drink alcohol. His drug  history is not on file.  No Known Allergies  MEDICATIONS:                                                                                                                     Prior to Admission:  Prescriptions prior to admission  Medication Sig Dispense Refill  . acarbose (PRECOSE) 50 MG tablet Take 50 mg by mouth 3 (three) times daily with meals.      Marland Kitchen aspirin 81 MG chewable tablet Chew 81 mg by mouth daily.      . captopril (CAPOTEN) 25 MG tablet Take 25 mg by mouth 2 (two) times daily.      . cholecalciferol (VITAMIN D) 1000 UNITS tablet Take 2,000 Units by mouth daily.      . Cyanocobalamin (VITAMIN B-12 PO) Take 2,500 mcg by mouth daily.      . fluticasone (FLONASE) 50 MCG/ACT nasal spray Place 2 sprays into the nose daily.      . furosemide (LASIX) 20 MG tablet Take 20 mg by mouth 2 (two) times daily.      Marland Kitchen gabapentin (NEURONTIN) 300 MG capsule Take 300 mg by mouth 3 (three) times daily.      Marland Kitchen glimepiride (AMARYL) 1 MG tablet Take 1 mg by mouth 2 (two) times daily.      Marland Kitchen HYDROcodone-acetaminophen (NORCO/VICODIN) 5-325 MG per tablet Take 1 tablet by mouth every 4 (four) hours as needed. For pain      . LORazepam (ATIVAN) 0.5 MG tablet Take 0.5 mg by mouth every 6 (six) hours as needed. For anxiety      . metFORMIN (GLUCOPHAGE) 500 MG tablet Take 1,000 mg by mouth 2 (two) times daily with a meal.      . methocarbamol (ROBAXIN)  500 MG tablet Take 500 mg by mouth every 8 (eight) hours as needed. For muscle spasm      . Multiple Vitamins-Minerals (MULTIVITAMIN PO) Take 1 tablet by mouth daily.      . potassium chloride (K-DUR) 10 MEQ tablet Take 10 mEq by mouth 2 (two) times daily.      . pravastatin (PRAVACHOL) 40 MG tablet Take 40 mg by mouth at bedtime.      . pseudoephedrine-guaifenesin (MUCINEX D) 60-600 MG per tablet Take 1 tablet by mouth every 12 (twelve) hours.      . senna (SENOKOT) 8.6 MG TABS Take 1 tablet by mouth daily.       Scheduled: . aspirin  81 mg Oral Daily  . azithromycin  500 mg Oral Q24H  . captopril  25 mg Oral BID  . carvedilol  3.125 mg Oral BID WC  . cefpodoxime  200 mg Oral Q12H  . fluticasone  2 spray Each Nare Daily  . furosemide      . furosemide  40 mg Intravenous Q12H  . guaiFENesin  1,200 mg Oral BID  . heparin  5,000 Units Subcutaneous Q8H  . insulin aspart  0-9 Units Subcutaneous TID WC  . polyethylene  glycol  17 g Oral Daily  . QUEtiapine  12.5 mg Oral QHS  . senna-docusate  2 tablet Oral BID  . simvastatin  20 mg Oral QPM  . sodium chloride  3 mL Intravenous Q12H     ROS:                                                                                                                                       History obtained from the patient  General ROS: negative for - chills, fatigue, fever, night sweats, weight gain or weight loss Psychological ROS: negative for - behavioral disorder, hallucinations, memory difficulties, mood swings or suicidal ideation Ophthalmic ROS: negative for - blurry vision, double vision, eye pain or loss of vision ENT ROS: negative for - epistaxis, nasal discharge, oral lesions, sore throat, tinnitus or vertigo Allergy and Immunology ROS: negative for - hives or itchy/watery eyes Hematological and Lymphatic ROS: negative for - bleeding problems, bruising or swollen lymph nodes Endocrine ROS: negative for - galactorrhea, hair pattern changes,  polydipsia/polyuria or temperature intolerance Respiratory ROS: negative for - cough, hemoptysis, shortness of breath or wheezing Cardiovascular ROS: negative for - chest pain, dyspnea on exertion, edema or irregular heartbeat Gastrointestinal ROS: negative for - abdominal pain, diarrhea, hematemesis, nausea/vomiting or stool incontinence Genito-Urinary ROS: negative for - dysuria, hematuria, incontinence or urinary frequency/urgency Musculoskeletal ROS: negative for - joint swelling or muscular weakness Neurological ROS: as noted in HPI Dermatological ROS: negative for rash and skin lesion changes   Blood pressure 156/89, pulse 80, temperature 97.5 F (36.4 C), temperature source Oral, resp. rate 20, height 5\' 10"  (1.778 m), weight 101 kg (222 lb 10.6 oz), SpO2 100.00%.   Neurologic Examination:                                                                                                      Mental Status: Alert, oriented to city, county, year but will not tell me where he is as "he is sick of telling people that answer".  Cannot do [employer], recall three objects, add 04-08-08-25 together to make 41. Thought process is tangential and slow.  Speech fluent without evidence of aphasia.  Able to follow 3 step commands without difficulty. Cranial Nerves: II: Discs flat bilaterally; Visual fields grossly normal, pupils equal, round, reactive to light and accommodation III,IV, VI: ptosis not present, extra-ocular motions intact bilaterally V,VII: smile symmetric, facial light touch sensation normal bilaterally VIII: hearing normal bilaterally IX,X: gag reflex present  XI: bilateral shoulder shrug XII: midline tongue extension Motor: Right : Upper extremity   5/5    Left:     Upper extremity   5/5  Lower extremity   5/5     Lower extremity   5/5 Tone and bulk:normal tone throughout; no atrophy noted Sensory: Pinprick and light touch stated to be slightly decreased on left arm and leg with  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  Deep Tendon Reflexes:  Right: Upper Extremity   Left: Upper extremity   biceps (C-5 to C-6) 2/4   biceps (C-5 to C-6) 2/4 tricep (C7) 2/4    triceps (C7) 2/4 Brachioradialis (C6) 2/4  Brachioradialis (C6) 2/4  Lower Extremity Lower Extremity  quadriceps (L-2 to L-4) 1/4   quadriceps (L-2 to L-4) 0/4 Achilles (S1) 0/4   Achilles (S1) 0/4  Plantars: equivocal bilaterally Cerebellar: normal finger-to-nose,   CV: pulses palpable throughout    No components found with this basename: cbc,  bmp,  coags,  chol,  tri,  ldl,  hga1c    Results for orders placed during the hospital encounter of 10/29/12 (from the past 48 hour(s))  BASIC METABOLIC PANEL     Status: Abnormal   Collection Time    10/31/12  2:21 PM      Result Value Range   Sodium 133 (*) 135 - 145 mEq/L   Potassium 5.1  3.5 - 5.1 mEq/L   Chloride 100  96 - 112 mEq/L   CO2 24  19 - 32 mEq/L   Glucose, Bld 99  70 - 99 mg/dL   BUN 37 (*) 6 - 23 mg/dL   Creatinine, Ser 1.61 (*) 0.50 - 1.35 mg/dL   Calcium 9.5  8.4 - 09.6 mg/dL   GFR calc non Af Amer 49 (*) >90 mL/min   GFR calc Af Amer 56 (*) >90 mL/min   Comment:            The eGFR has been calculated     using the CKD EPI equation.     This calculation has not been     validated in all clinical     situations.     eGFR's persistently     <90 mL/min signify     possible Chronic Kidney Disease.  MAGNESIUM     Status: Abnormal   Collection Time    10/31/12  2:21 PM      Result Value Range   Magnesium 2.6 (*) 1.5 - 2.5 mg/dL  GLUCOSE, CAPILLARY     Status: None   Collection Time    10/31/12  3:39 PM      Result Value Range   Glucose-Capillary 79  70 - 99 mg/dL  GLUCOSE, CAPILLARY     Status: None   Collection Time    10/31/12  9:05 PM      Result Value Range   Glucose-Capillary 97  70 - 99 mg/dL  GLUCOSE, CAPILLARY     Status: Abnormal   Collection Time    11/01/12  6:01 AM      Result Value Range    Glucose-Capillary 108 (*) 70 - 99 mg/dL  CBC     Status: Abnormal   Collection Time    11/01/12  8:22 AM      Result Value Range   WBC 6.3  4.0 - 10.5 K/uL   RBC 3.89 (*) 4.22 - 5.81 MIL/uL   Hemoglobin 11.5 (*) 13.0 - 17.0 g/dL   HCT 04.5 (*) 40.9 -  52.0 %   MCV 87.4  78.0 - 100.0 fL   MCH 29.6  26.0 - 34.0 pg   MCHC 33.8  30.0 - 36.0 g/dL   RDW 16.1 (*) 09.6 - 04.5 %   Platelets 202  150 - 400 K/uL  COMPREHENSIVE METABOLIC PANEL     Status: Abnormal   Collection Time    11/01/12  8:22 AM      Result Value Range   Sodium 136  135 - 145 mEq/L   Potassium 5.1  3.5 - 5.1 mEq/L   Chloride 101  96 - 112 mEq/L   CO2 24  19 - 32 mEq/L   Glucose, Bld 181 (*) 70 - 99 mg/dL   BUN 43 (*) 6 - 23 mg/dL   Creatinine, Ser 4.09 (*) 0.50 - 1.35 mg/dL   Calcium 9.2  8.4 - 81.1 mg/dL   Total Protein 7.3  6.0 - 8.3 g/dL   Albumin 3.4 (*) 3.5 - 5.2 g/dL   AST 51 (*) 0 - 37 U/L   ALT 30  0 - 53 U/L   Alkaline Phosphatase 109  39 - 117 U/L   Total Bilirubin 0.7  0.3 - 1.2 mg/dL   GFR calc non Af Amer 49 (*) >90 mL/min   GFR calc Af Amer 56 (*) >90 mL/min   Comment:            The eGFR has been calculated     using the CKD EPI equation.     This calculation has not been     validated in all clinical     situations.     eGFR's persistently     <90 mL/min signify     possible Chronic Kidney Disease.  RPR     Status: None   Collection Time    11/01/12  8:22 AM      Result Value Range   RPR NON REACTIVE  NON REACTIVE  PRO B NATRIURETIC PEPTIDE     Status: Abnormal   Collection Time    11/01/12  8:22 AM      Result Value Range   Pro B Natriuretic peptide (BNP) 13622.0 (*) 0 - 125 pg/mL  GLUCOSE, CAPILLARY     Status: Abnormal   Collection Time    11/01/12 11:22 AM      Result Value Range   Glucose-Capillary 190 (*) 70 - 99 mg/dL   Comment 1 Notify RN    GLUCOSE, CAPILLARY     Status: Abnormal   Collection Time    11/01/12  4:18 PM      Result Value Range   Glucose-Capillary 157 (*)  70 - 99 mg/dL   Comment 1 Notify RN    GLUCOSE, CAPILLARY     Status: Abnormal   Collection Time    11/01/12  8:52 PM      Result Value Range   Glucose-Capillary 133 (*) 70 - 99 mg/dL  CBC     Status: Abnormal   Collection Time    11/02/12  5:45 AM      Result Value Range   WBC 6.0  4.0 - 10.5 K/uL   RBC 3.94 (*) 4.22 - 5.81 MIL/uL   Hemoglobin 11.5 (*) 13.0 - 17.0 g/dL   HCT 91.4 (*) 78.2 - 95.6 %   MCV 88.6  78.0 - 100.0 fL   MCH 29.2  26.0 - 34.0 pg   MCHC 33.0  30.0 - 36.0 g/dL   RDW 21.3 (*) 08.6 -  15.5 %   Platelets 208  150 - 400 K/uL  BASIC METABOLIC PANEL     Status: Abnormal   Collection Time    11/02/12  5:45 AM      Result Value Range   Sodium 136  135 - 145 mEq/L   Potassium 4.8  3.5 - 5.1 mEq/L   Chloride 102  96 - 112 mEq/L   CO2 20  19 - 32 mEq/L   Glucose, Bld 133 (*) 70 - 99 mg/dL   BUN 42 (*) 6 - 23 mg/dL   Creatinine, Ser 1.61  0.50 - 1.35 mg/dL   Calcium 9.4  8.4 - 09.6 mg/dL   GFR calc non Af Amer 55 (*) >90 mL/min   GFR calc Af Amer 63 (*) >90 mL/min   Comment:            The eGFR has been calculated     using the CKD EPI equation.     This calculation has not been     validated in all clinical     situations.     eGFR's persistently     <90 mL/min signify     possible Chronic Kidney Disease.  GLUCOSE, CAPILLARY     Status: Abnormal   Collection Time    11/02/12  5:46 AM      Result Value Range   Glucose-Capillary 126 (*) 70 - 99 mg/dL    2D ECHO 0/45  Study Conclusions  - Left ventricle: The cavity size was severely dilated. Wall thickness was normal. Systolic function was severely reduced. The estimated ejection fraction was in the range of 20% to 25%. Diffuse hypokinesis. - Left atrium: The atrium was moderately dilated. - Right ventricle: The cavity size was moderately dilated. - Right atrium: The atrium was moderately dilated. - Pulmonary arteries: PA peak pressure: 34mm Hg (S). - Pericardium, extracardiac: There was a left  pleuraleffusion   Felicie Morn PA-C [facility] [phone]  11/02/2012, 1:31 PM   Patient seen and examined.  Clinical course and management discussed.  Necessary edits performed.  I agree with the above.  Assessment and plan of care developed and discussed below.    Assessment/Plan: 72 year old male with a history of memory loss.  Does not seem to have been acute but has occurred over a period of time.  Patient likely with a dementia but if he has been having recurrent episodes of hypoglycemia he may have some memory issuers related to this as well since this portion of the brain ia very sensitive to glucose availablility.  Dementia work up is unremarkable. EEG is slow but shows no other abnormalities.    Recommendations: 1.  No further neurologic intervention is recommended at this time.  Since patient with recent hypoglycemia should wait prior to the initiation of medications for Alzheimer's.  Would have patient have a baseline established as an outpatient and then medications started so that clinical response can be determined.  If further questions arise, please call or page at that time.  Thank you for allowing neurology to participate in the care of this patient.   Thana Farr, MD [facility] [phone]  11/02/2012  4:20 PM

## 2012-11-02 NOTE — Progress Notes (Signed)
CSW spoke to [facility] who stated that patient has outstanding balance and cannot return. CSW spoke to wife to inform her. Wife stated that her second choice would be [facility], but she is going to tour it first. CSW spoke with [facility] and confirmed a bed on Monday if medically ready to dc.  Maree Krabbe, MSW, [facility] [phone]

## 2012-11-02 NOTE — Progress Notes (Signed)
EEG Completed; Results Pending  

## 2012-11-02 NOTE — Progress Notes (Signed)
Physical Therapy Treatment Patient Details Name: [name] MRN: [id number] DOB: Dec 12, 1940 Today's Date: 11/02/2012 Time: 4098-1191 PT Time Calculation (min): 25 min  PT Assessment / Plan / Recommendation  History of Present Illness [name] is a 72 y.o. male with past medical history of diabetes mellitus, hypertension and history of RUE DVT. Patient brought to the hospital because of confusion. He was found to be hypoglycemic with CBG of 25. Initial pulse ox in the emergency department was 75% on room air, with minimal improvement on nasal cannula. Patient was placed on NRB. CBG improved to 96. Patient evaluated by chest x-ray and showed bibasilar pneumonia.   PT Comments   Patient demonstrates improvements in overall function and cognition. Patient is pleasant throughout session. Demonstrates improved cognition during conversational assessment.  Patient able to tolerated increased ambulation and minimal there-ex today. Educated patient on importance of edema control and elevation. Patient appears receptive to information today. Will continue to see and progress activity as tolerated. Feel patient will still need ST SNF prior to discharge home.  Follow Up Recommendations  SNF           Equipment Recommendations  Other (comment) (TBD)       Frequency Min 3X/week   Progress towards PT Goals Progress towards PT goals: Progressing toward goals  Plan Current plan remains appropriate    Precautions / Restrictions Precautions Precautions: Fall Restrictions Weight Bearing Restrictions: No   Pertinent Vitals/Pain Reports no pain at this time just discomfort    Mobility  Bed Mobility Bed Mobility: Not assessed Details for Bed Mobility Assistance: received in recliner Transfers Transfers: Sit to Stand;Stand to Sit Sit to Stand: 4: Min guard Stand to Sit: 4: Min assist;With armrests;To chair/3-in-1 Details for Transfer Assistance: VCs for hand  placement Ambulation/Gait Ambulation/Gait Assistance: 4: Min guard, 4: Min assist Ambulation Distance (Feet): 200 Feet Assistive device: Rolling walker Ambulation/Gait Assistance Details: Improved stability, VCs for upright posture and position within rw Gait Pattern: Step-through pattern;Decreased stride length;Trunk flexed Gait velocity: decreased General Gait Details: Patient with improvements in gait and functional mobility despite LE pain    Exercises General Exercises - Lower Extremity Ankle Circles/Pumps: AROM;Both;10 reps Straight Leg Raises: AROM;AAROM;Both;10 reps (difficulty with SLR secondary to fatigue and discomfort)    PT Goals (current goals can now be found in the care plan section) Acute Rehab PT Goals Patient Stated Goal: to go home PT Goal Formulation: With patient Time For Goal Achievement: 11/14/12 Potential to Achieve Goals: Fair  Visit Information  Last PT Received On: 11/02/12 Assistance Needed: +1 History of Present Illness: [name] is a 72 y.o. male with past medical history of diabetes mellitus, hypertension and history of RUE DVT. Patient brought to the hospital because of confusion. He was found to be hypoglycemic with CBG of 25. Initial pulse ox in the emergency department was 75% on room air, with minimal improvement on nasal cannula. Patient was placed on NRB. CBG improved to 96. Patient evaluated by chest x-ray and showed bibasilar pneumonia.    Subjective Data  Subjective: I feel a little stronger, would love a diet dr pepper and sugar free gum Patient Stated Goal: to go home   Cognition  Cognition Arousal/Alertness: Awake/alert Behavior During Therapy: WFL for tasks assessed/performed Overall Cognitive Status: Impaired/Different from baseline Area of Impairment: Memory;Safety/judgement;Awareness;Problem solving Memory: Decreased short-term memory General Comments: Pt much improved and pleasent today.    Balance  Balance Balance  Assessed: Yes Static Standing Balance Static Standing -  Balance Support: No upper extremity supported Static Standing - Level of Assistance: 5: Stand by assistance High Level Balance High Level Balance Activites: Side stepping;Backward walking;Direction changes;Turns High Level Balance Comments: some instability noted with increased fatigue, cues for position within rw  End of Session PT - End of Session Equipment Utilized During Treatment: Gait belt Activity Tolerance: Treatment limited secondary to fatigue Patient left: in chair;with call bell/phone within reach;with nursing/sitter in room Nurse Communication: Mobility status   GP     Fabio Asa 11/02/2012, 11:26 AM Charlotte Crumb, PT DPT  [phone]

## 2012-11-02 NOTE — Procedures (Signed)
ELECTROENCEPHALOGRAM REPORT   Patient: [name]       Room #: 1884 EEG No. ID: [id number] Age: 72 y.o.        Sex: male Referring Physician: Rito Ehrlich Report Date:  11/02/2012        Interpreting Physician: Thana Farr D  History: [name] is an 72 y.o. male with altered mental status  Medications:  Scheduled: . aspirin  81 mg Oral Daily  . azithromycin  500 mg Oral Q24H  . carvedilol  3.125 mg Oral BID WC  . cefpodoxime  200 mg Oral Q12H  . fluticasone  2 spray Each Nare Daily  . furosemide      . furosemide  40 mg Intravenous TID  . guaiFENesin  1,200 mg Oral BID  . heparin  5,000 Units Subcutaneous Q8H  . insulin aspart  0-9 Units Subcutaneous TID WC  . lisinopril  5 mg Oral BID  . polyethylene glycol  17 g Oral Daily  . QUEtiapine  12.5 mg Oral QHS  . senna-docusate  2 tablet Oral BID  . simvastatin  20 mg Oral QPM  . sodium chloride  3 mL Intravenous Q12H    Conditions of Recording:  This is a 16 channel EEG carried out with the patient in the awake, drowsy and asleep states.  Description:  The waking background activity consists of a low voltage, symmetrical, fairly well organized, 6-7 Hz theta activity, seen from the parieto-occipital and posterior temporal regions.  Low voltage fast activity, poorly organized, is seen anteriorly and is at times superimposed on more posterior regions.  A mixture of theta and alpha rhythms are seen from the central and temporal regions. The patient drowses with the background activity remaining slow.     The patient goes in to a light sleep with symmetrical sleep spindles, vertex central sharp transients and irregular slow activity.  Hyperventilation was not performed.  Intermittent photic stimulation was performed but failed to illicit any change in the tracing.   IMPRESSION: This EEG is characterized by slowing during wakefulness.  Although this may represent normal drowse can not rule out the possibility of posterior  background slowing due to a diffuse gray matter disturbance that is etiologically nonspecific and may include a dementia among othe possibilities.  No epileptiform activity is noted.     Thana Farr, MD [facility] [phone] 11/02/2012, 5:29 PM

## 2012-11-03 DIAGNOSIS — I428 Other cardiomyopathies: Secondary | ICD-10-CM

## 2012-11-03 DIAGNOSIS — I5021 Acute systolic (congestive) heart failure: Secondary | ICD-10-CM | POA: Diagnosis present

## 2012-11-03 LAB — BASIC METABOLIC PANEL
BUN: 42 mg/dL — ABNORMAL HIGH (ref 6–23)
CO2: 25 mEq/L (ref 19–32)
Chloride: 101 mEq/L (ref 96–112)
GFR calc non Af Amer: 53 mL/min — ABNORMAL LOW (ref >90)
Glucose, Bld: 150 mg/dL — ABNORMAL HIGH (ref 70–99)
Potassium: 4.3 mEq/L (ref 3.5–5.1)
Sodium: 138 mEq/L (ref 135–145)

## 2012-11-03 LAB — CBC
HCT: 32.7 % — ABNORMAL LOW (ref 39.0–52.0)
Hemoglobin: 10.7 g/dL — ABNORMAL LOW (ref 13.0–17.0)
MCHC: 32.7 g/dL (ref 30.0–36.0)
RBC: 3.71 MIL/uL — ABNORMAL LOW (ref 4.22–5.81)

## 2012-11-03 LAB — GLUCOSE, CAPILLARY
Glucose-Capillary: 122 mg/dL — ABNORMAL HIGH (ref 70–99)
Glucose-Capillary: 172 mg/dL — ABNORMAL HIGH (ref 70–99)

## 2012-11-03 MED ORDER — LISINOPRIL 10 MG PO TABS
10.0000 mg | ORAL_TABLET | Freq: Two times a day (BID) | ORAL | Status: DC
  Administered 2012-11-03 – 2012-11-04 (×3): 10 mg via ORAL
  Filled 2012-11-03 (×5): qty 1

## 2012-11-03 NOTE — Progress Notes (Signed)
[facility] PROGRESS NOTE  [name] XBJ:[id number] DOB: [birth year] DOA: 10/29/2012  PCP: [name], MD  Brief HPI: [name] is a 72 y.o. male with past medical history of diabetes mellitus, hypertension and history of RUE DVT. Patient brought to the hospital because of confusion. He was found to be hypoglycemic with CBG of 25. Initial pulse ox in the emergency department was 75% on room air, with minimal improvement on nasal cannula. Patient was placed on NRB. CBG improved to 96. Patient evaluated by chest x-ray and showed bibasilar pneumonia.  Past medical history:  Past Medical History  Diagnosis Date  . Diabetes mellitus   . Hypertension   . Hyperlipidemia   . Cervical spinal stenosis   . Lumbar spinal stenosis   . 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 DVT (deep venous thrombosis)     RUE 10/2011  . Anemia   . Cardiomyopathy     a. 10/2012 Echo: EF 20-25%, diff HK, mod dil LA/RV/RA.    Consultants: None  Procedures:  2D ECHO 7/30 Study Conclusions  - Left ventricle: The cavity size was severely dilated. Wall thickness was normal. Systolic function was severely reduced. The estimated ejection fraction was in the range of 20% to 25%. Diffuse hypokinesis. - Left atrium: The atrium was moderately dilated. - Right ventricle: The cavity size was moderately dilated. - Right atrium: The atrium was moderately dilated. - Pulmonary arteries: PA peak pressure: 34mm Hg (S). - Pericardium, extracardiac: There was a left pleuraleffusion.  Antibiotics: Ceftriaxone 7/28-->8/1 Azithromycin 7/28--> Vantin 8/1-->  Subjective: Patient continues to be confused. Denies pain.   Objective: Vital Signs  Filed Vitals:   11/02/12 1400 11/02/12 2028 11/03/12 0612 11/03/12 1125  BP: 137/75 145/75 155/70 143/73  Pulse: 86 86 89 92  Temp: 97.6 F (36.4 C) 98.1 F (36.7 C) 98 F (36.7 C)   TempSrc: Oral Oral Oral   Resp: 18 18 18    Height:       Weight:   102.3 kg (225 lb 8.5 oz)   SpO2: 100% 94% 96%     Intake/Output Summary (Last 24 hours) at 11/03/12 1312 Last data filed at 11/03/12 1125  Gross per 24 hour  Intake      3 ml  Output   2150 ml  Net  -2147 ml   Filed Weights   10/29/12 1718 11/03/12 0612  Weight: 101 kg (222 lb 10.6 oz) 102.3 kg (225 lb 8.5 oz)    General appearance: alert, cooperative, appears stated age, distracted and no distress Head: Normocephalic, without obvious abnormality, atraumatic Resp: Decreased air entry at the bases with few crackles. No wheezing. Cardio: regular rate and rhythm, S1, S2 normal, no murmur, click, rub or gallop GI: soft, non-tender; bowel sounds normal; no masses,  no organomegaly Extremities: Wounds noted both legs. No erythema noted. Edema 2+ Pulses: 2+ and symmetric Neurologic: Alert. He knew he was in [place]. Oriented to wife. Not oriented to time, date, day, month, year. No cranial nerve deficits. No focal motor deficits noted.  Lab Results:  Basic Metabolic Panel:  Recent Labs Lab 10/30/12 0524 10/31/12 1421 11/01/12 0822 11/02/12 0545 11/03/12 0419  NA 136 133* 136 136 138  K 4.5 5.1 5.1 4.8 4.3  CL 102 100 101 102 101  CO2 24 24 24 20 25   GLUCOSE 130* 99 181* 133* 150*  BUN 33* 37* 43* 42* 42*  CREATININE 1.29 1.41* 1.41* 1.28 1.31  CALCIUM 9.7  9.5 9.2 9.4 9.4  MG  --  2.6*  --   --   --    Liver Function Tests:  Recent Labs Lab 10/29/12 1204 11/01/12 0822  AST 33 51*  ALT 17 30  ALKPHOS 118* 109  BILITOT 0.7 0.7  PROT 7.7 7.3  ALBUMIN 3.5 3.4*   CBC:  Recent Labs Lab 10/29/12 1204 10/29/12 1826 10/30/12 0524 11/01/12 0822 11/02/12 0545 11/03/12 0419  WBC 7.8 5.6 4.8 6.3 6.0 5.2  NEUTROABS 5.9  --   --   --   --   --   HGB 10.1* 10.1* 9.8* 11.5* 11.5* 10.7*  HCT 30.8* 30.4* 31.0* 34.0* 34.9* 32.7*  MCV 89.0 88.6 89.1 87.4 88.6 88.1  PLT 183 172 176 202 208 183   Cardiac Enzymes:  Recent Labs Lab 10/30/12 1733  10/31/12 0018 10/31/12 0440  TROPONINI <0.30 <0.30 <0.30   BNP (last 3 results)  Recent Labs  11/01/12 0822  PROBNP 13622.0*   CBG:  Recent Labs Lab 11/02/12 1117 11/02/12 1640 11/02/12 2112 11/03/12 0621 11/03/12 1111  GLUCAP 134* 112* 178* 122* 160*    No results found for this or any previous visit (from the past 240 hour(s)).    Studies/Results: No results found.  Medications:  Scheduled: . aspirin  81 mg Oral Daily  . azithromycin  500 mg Oral Q24H  . carvedilol  3.125 mg Oral BID WC  . cefpodoxime  200 mg Oral Q12H  . fluticasone  2 spray Each Nare Daily  . furosemide  40 mg Intravenous TID  . guaiFENesin  1,200 mg Oral BID  . heparin  5,000 Units Subcutaneous Q8H  . insulin aspart  0-9 Units Subcutaneous TID WC  . lisinopril  10 mg Oral BID  . polyethylene glycol  17 g Oral Daily  . QUEtiapine  12.5 mg Oral QHS  . senna-docusate  2 tablet Oral BID  . simvastatin  20 mg Oral QPM  . sodium chloride  3 mL Intravenous Q12H   Continuous:  ZOX:[id number], acetaminophen, albuterol, alum & mag hydroxide-simeth, bisacodyl, HYDROcodone-acetaminophen, magnesium hydroxide, morphine injection, ondansetron (ZOFRAN) IV, ondansetron, simethicone  Assessment/Plan:  Principal Problem:   Acute encephalopathy Active Problems:   Falls frequently   Diabetes mellitus, type 2   HTN (hypertension)   DVT of right proximal brachial through the distal axillary vein, acute   Hypoglycemia   Community acquired pneumonia   Dilated cardiomyopathy   Acute systolic CHF (congestive heart failure)    Acute encephalopathy  Was thought secondary to hypoglycemia and hypoxia. But hasn't improved despite improvement in glucose levels. Wife states he has been confused for last few months. MRI brain could not be done due to dyspnea. Appreciate neurology input. Could be confused due to low cardiac output as well. . LFt's are normal. Vit B12 and TSH normal. RPR is non reactive. Could  have underlying dementia. Holding sedating agents. Seroquel initiated 8/1. Monitor closely. Hasn't shown much improvement yet. Last EKG does not show prolonged Qt.  Newly detected Dilated Cardiomyopathy with Acute Systolic CHF EF is found to be 20% on ECHO with diffuse hypokinesis. Appreciate cardiology input. On aggressive diuresis. May need inotropes. Will likely need cath as well. Tolerating Coreg well. He is on a ACEI. Monitor renal function closely.   Community acquired pneumonia  Chest x-ray showed bibasilar infiltrates which was thought to be consistent with community acquired pneumonia. See above. Changed to oral antibiotics.   Hypoglycemia  Resolved. Patient was on Amaryl. Continue  to hold oral hypoglycemic agents. A1C is 5.7.   Diabetes mellitus type 2  Started carbohydrate modified diet, SSI. Hemoglobin A1c is 5.7.   Hypoxia  Likely secondary to community acquired pneumonia versus CHF. This is improved, patient is on 2 L and his oxygen saturation is 100%. Will assess RA O2 when close to discharge.   Chronic bilateral lower extremity ulcers  Secondary to peripheral vascular disease, patient follows with outpatient wound clinic. No evidence of infection or recent change in the ulcers. Wound care consulted. Diuretics.  History of right upper extremity DVT  Patient was on Xarelto in 2013, not taking it anymore.   Normocytic Anemia Anemia panel reviewed.  DVT Prophylaxis:   Heparin Code Status: full code  Family Communication: No family at bedside. Disposition Plan: PT recommends SNF. Not ready for discharge.    LOS: 5 days   [facility]  [facility] Pager [phone] 11/03/2012, 1:12 PM  If 8PM-8AM, please contact night-coverage at [email or web address], password [facility]

## 2012-11-03 NOTE — Progress Notes (Signed)
   Subjective:  Denies CP or dyspnea; confused   Objective:  Filed Vitals:   11/02/12 0400 11/02/12 1400 11/02/12 2028 11/03/12 0612  BP: 156/89 137/75 145/75 155/70  Pulse: 80 86 86 89  Temp: 97.5 F (36.4 C) 97.6 F (36.4 C) 98.1 F (36.7 C) 98 F (36.7 C)  TempSrc: Oral Oral Oral Oral  Resp: 20 18 18 18   Height:      Weight:    225 lb 8.5 oz (102.3 kg)  SpO2: 100% 100% 94% 96%    Intake/Output from previous day:  Intake/Output Summary (Last 24 hours) at 11/03/12 1055 Last data filed at 11/02/12 2159  Gross per 24 hour  Intake    240 ml  Output   3000 ml  Net  -2760 ml    Physical Exam: Physical exam: Well-developed chronically ill appearing in no acute distress.  Skin is warm and dry.  HEENT is normal.  Neck is supple.  Chest is clear to auscultation with normal expansion.  Cardiovascular exam is regular rate and rhythm.  Abdominal exam nontender or distended. No masses palpated. Extremities show chronic skin changes and 3 + edema Diffuse anasarca neuro confused   Lab Results: Basic Metabolic Panel:  Recent Labs  16/10/96 1421  11/02/12 0545 11/03/12 0419  NA 133*  < > 136 138  K 5.1  < > 4.8 4.3  CL 100  < > 102 101  CO2 24  < > 20 25  GLUCOSE 99  < > 133* 150*  BUN 37*  < > 42* 42*  CREATININE 1.41*  < > 1.28 1.31  CALCIUM 9.5  < > 9.4 9.4  MG 2.6*  --   --   --   < > = values in this interval not displayed. CBC:  Recent Labs  11/02/12 0545 11/03/12 0419  WBC 6.0 5.2  HGB 11.5* 10.7*  HCT 34.9* 32.7*  MCV 88.6 88.1  PLT 208 183     Assessment/Plan:  1 acute systolic congestive heart failure-the patient remains markedly volume overloaded. Continue diuresis and follow renal function. If he does not improve he will need inotropes. 2 cardiomyopathy-continue ACE inhibitor (increase lisinopril to 10 mg po BID) and low-dose beta blocker. Plan is ultimately to proceed with right and left catheterization. However he is confused this morning  and I am not convinced he will be ready early next week. 3 confusion-workup in progress. 4 pneumonia-management per primary care.  Olga Millers 11/03/2012, 10:55 AM

## 2012-11-04 LAB — BASIC METABOLIC PANEL
CO2: 26 mEq/L (ref 19–32)
Chloride: 101 mEq/L (ref 96–112)
Glucose, Bld: 136 mg/dL — ABNORMAL HIGH (ref 70–99)
Potassium: 4.5 mEq/L (ref 3.5–5.1)
Sodium: 137 mEq/L (ref 135–145)

## 2012-11-04 LAB — GLUCOSE, CAPILLARY
Glucose-Capillary: 114 mg/dL — ABNORMAL HIGH (ref 70–99)
Glucose-Capillary: 163 mg/dL — ABNORMAL HIGH (ref 70–99)
Glucose-Capillary: 158 mg/dL — ABNORMAL HIGH (ref 70–99)
Glucose-Capillary: 165 mg/dL — ABNORMAL HIGH (ref 70–99)

## 2012-11-04 MED ORDER — MILRINONE IN DEXTROSE 20 MG/100ML IV SOLN
0.2500 ug/kg/min | INTRAVENOUS | Status: DC
  Administered 2012-11-04 (×2): 0.375 ug/kg/min via INTRAVENOUS
  Administered 2012-11-05: 0.25 ug/kg/min via INTRAVENOUS
  Administered 2012-11-05: 0.375 ug/kg/min via INTRAVENOUS
  Administered 2012-11-05 – 2012-11-07 (×4): 0.25 ug/kg/min via INTRAVENOUS
  Filled 2012-11-04 (×8): qty 100

## 2012-11-04 NOTE — Progress Notes (Signed)
[facility] PROGRESS NOTE  [name]:[id number] DOB: 07-Dec-1940 DOA: 10/29/2012  PCP: [name], MD  Brief HPI: [name] is a 72 y.o. male with past medical history of diabetes mellitus, hypertension and history of RUE DVT. Patient brought to the hospital because of confusion. [name] was found to be hypoglycemic with CBG of 25. Initial pulse ox in the emergency department was 75% on room air, with minimal improvement on nasal cannula. Patient was placed on NRB. CBG improved to 96. Patient evaluated by chest x-ray and showed bibasilar pneumonia.  Past medical history:  Past Medical History  Diagnosis Date  . Diabetes mellitus   . Hypertension   . Hyperlipidemia   . Cervical spinal stenosis   . Lumbar spinal stenosis   . 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 DVT (deep venous thrombosis)     RUE 10/2011  . Anemia   . Cardiomyopathy     a. 10/2012 Echo: EF 20-25%, diff HK, mod dil LA/RV/RA.    Consultants: [facility], Neurology  Procedures:  2D ECHO 7/30 Study Conclusions  - Left ventricle: The cavity size was severely dilated. Wall thickness was normal. Systolic function was severely reduced. The estimated ejection fraction was in the range of 20% to 25%. Diffuse hypokinesis. - Left atrium: The atrium was moderately dilated. - Right ventricle: The cavity size was moderately dilated. - Right atrium: The atrium was moderately dilated. - Pulmonary arteries: PA peak pressure: 34mm Hg (S). - Pericardium, extracardiac: There was a left pleuraleffusion.  Antibiotics: Ceftriaxone 7/28-->8/1 Azithromycin 7/28--> Vantin 8/1-->  Subjective: Patient continues to be confused. Denies pain.   Objective: Vital Signs  Filed Vitals:   11/04/12 0415 11/04/12 0853 11/04/12 1112 11/04/12 1253  BP: 149/98 148/72 142/81 109/46  Pulse: 84 89  62  Temp: 97.2 F (36.2 C)   98.4 F (36.9 C)  TempSrc: Oral   Oral  Resp: 18   18  Height:       Weight: 99.247 kg (218 lb 12.8 oz)     SpO2: 96%   98%    Intake/Output Summary (Last 24 hours) at 11/04/12 1406 Last data filed at 11/04/12 0845  Gross per 24 hour  Intake    240 ml  Output      0 ml  Net    240 ml   Filed Weights   10/29/12 1718 11/03/12 0612 11/04/12 0415  Weight: 101 kg (222 lb 10.6 oz) 102.3 kg (225 lb 8.5 oz) 99.247 kg (218 lb 12.8 oz)    General appearance: alert, cooperative, appears stated age, distracted and no distress Head: Normocephalic, without obvious abnormality, atraumatic Resp: Decreased air entry at the bases with few crackles. No wheezing. Cardio: regular rate and rhythm, S1, S2 normal, no murmur, click, rub or gallop GI: soft, non-tender; bowel sounds normal; no masses,  no organomegaly Extremities: Wounds noted both legs. No erythema noted. Edema 2+ Pulses: 2+ and symmetric Neurologic: Alert. [name] knew [name] was in [place]. Oriented to wife. Not oriented to time, date, day, month, year. No cranial nerve deficits. No focal motor deficits noted.  Lab Results:  Basic Metabolic Panel:  Recent Labs Lab 10/30/12 0524 10/31/12 1421 11/01/12 0822 11/02/12 0545 11/03/12 0419 11/04/12 0416  NA 136 133* 136 136 138 137  K 4.5 5.1 5.1 4.8 4.3 4.5  CL 102 100 101 102 101 101  CO2 24 24 24 20 25 26   GLUCOSE 130* 99 181* 133* 150* 136*  BUN 33* 37* 43* 42* 42* 44*  CREATININE 1.29 1.41* 1.41* 1.28 1.31 1.51*  CALCIUM 9.7 9.5 9.2 9.4 9.4 9.7  MG  --  2.6*  --   --   --   --    Liver Function Tests:  Recent Labs Lab 10/29/12 1204 11/01/12 0822  AST 33 51*  ALT 17 30  ALKPHOS 118* 109  BILITOT 0.7 0.7  PROT 7.7 7.3  ALBUMIN 3.5 3.4*   CBC:  Recent Labs Lab 10/29/12 1204 10/29/12 1826 10/30/12 0524 11/01/12 0822 11/02/12 0545 11/03/12 0419  WBC 7.8 5.6 4.8 6.3 6.0 5.2  NEUTROABS 5.9  --   --   --   --   --   HGB 10.1* 10.1* 9.8* 11.5* 11.5* 10.7*  HCT 30.8* 30.4* 31.0* 34.0* 34.9* 32.7*  MCV 89.0 88.6 89.1 87.4 88.6 88.1  PLT  183 172 176 202 208 183   Cardiac Enzymes:  Recent Labs Lab 10/30/12 1733 10/31/12 0018 10/31/12 0440  TROPONINI <0.30 <0.30 <0.30   BNP (last 3 results)  Recent Labs  11/01/12 0822  PROBNP 13622.0*   CBG:  Recent Labs Lab 11/03/12 1111 11/03/12 1631 11/03/12 2106 11/04/12 0632 11/04/12 1105  GLUCAP 160* 141* 172* 114* 163*    No results found for this or any previous visit (from the past 240 hour(s)).    Studies/Results: No results found.  Medications:  Scheduled: . aspirin  81 mg Oral Daily  . azithromycin  500 mg Oral Q24H  . carvedilol  3.125 mg Oral BID WC  . cefpodoxime  200 mg Oral Q12H  . fluticasone  2 spray Each Nare Daily  . furosemide  40 mg Intravenous TID  . guaiFENesin  1,200 mg Oral BID  . heparin  5,000 Units Subcutaneous Q8H  . insulin aspart  0-9 Units Subcutaneous TID WC  . lisinopril  10 mg Oral BID  . polyethylene glycol  17 g Oral Daily  . QUEtiapine  12.5 mg Oral QHS  . senna-docusate  2 tablet Oral BID  . simvastatin  20 mg Oral QPM  . sodium chloride  3 mL Intravenous Q12H   Continuous: . milrinone 0.375 mcg/kg/min (11/04/12 1315)   ZOX:[id number], acetaminophen, albuterol, alum & mag hydroxide-simeth, bisacodyl, HYDROcodone-acetaminophen, magnesium hydroxide, morphine injection, ondansetron (ZOFRAN) IV, ondansetron, simethicone  Assessment/Plan:  Principal Problem:   Acute encephalopathy Active Problems:   Falls frequently   Diabetes mellitus, type 2   HTN (hypertension)   DVT of right proximal brachial through the distal axillary vein, acute   Hypoglycemia   Community acquired pneumonia   Dilated cardiomyopathy   Acute systolic CHF (congestive heart failure)    Acute encephalopathy  Was thought secondary to hypoglycemia and hypoxia. But hasn't improved despite improvement in glucose levels. Wife states [name] has been confused for last few months. MRI brain could not be done due to dyspnea. Appreciate neurology  input. Could be confused due to low cardiac output as well. LFt's are normal. Vit B12 and TSH normal. RPR is non reactive. Could have underlying dementia. Holding sedating agents. Seroquel initiated 8/1. Hasn't shown much improvement yet. Last EKG does not show prolonged Qt.  Newly detected Dilated Cardiomyopathy with Acute Systolic CHF EF is found to be 20% on ECHO with diffuse hypokinesis. Appreciate cardiology input. On aggressive diuresis and now on Milrinone. Will likely need cath as well. Tolerating Coreg well. [name] is on a ACEI. Monitor renal function closely.   Community acquired pneumonia  Chest x-ray showed bibasilar  infiltrates which was thought to be consistent with community acquired pneumonia. See above. Changed to oral antibiotics. Will stop after 7 days.  Hypoglycemia  Resolved. Patient was on Amaryl. Continue to hold oral hypoglycemic agents. A1C is 5.7.   Diabetes mellitus type 2  Started carbohydrate modified diet, SSI. Hemoglobin A1c is 5.7.   Hypoxia  Likely secondary to community acquired pneumonia versus CHF. This is improved, patient is on 2 L and his oxygen saturation is 100%. Will assess RA O2 when close to discharge.   Chronic bilateral lower extremity ulcers  Secondary to peripheral vascular disease, patient follows with outpatient wound clinic. No evidence of infection or recent change in the ulcers. Wound care consulted. Diuretics might help.  History of right upper extremity DVT  Patient was on Xarelto in 2013, not taking it anymore.   Normocytic Anemia Anemia panel reviewed.  DVT Prophylaxis:   Heparin Code Status: full code  Family Communication: No family at bedside. Spoke to his wife over the phone. Disposition Plan: PT recommends SNF. Not ready for discharge.    LOS: 6 days   [facility]  [facility] Pager [phone] 11/04/2012, 2:06 PM  If 8PM-8AM, please contact night-coverage at [email or web address], password [facility]

## 2012-11-04 NOTE — Progress Notes (Signed)
   Subjective:  Denies CP or dyspnea; mildly confused   Objective:  Filed Vitals:   11/03/12 1722 11/03/12 2100 11/04/12 0415 11/04/12 0853  BP: 138/70 117/57 149/98 148/72  Pulse: 68 54 84 89  Temp:  98.1 F (36.7 C) 97.2 F (36.2 C)   TempSrc:  Oral Oral   Resp:  18 18   Height:      Weight:   218 lb 12.8 oz (99.247 kg)   SpO2:  95% 96%     Intake/Output from previous day:  Intake/Output Summary (Last 24 hours) at 11/04/12 1106 Last data filed at 11/04/12 0845  Gross per 24 hour  Intake    483 ml  Output      0 ml  Net    483 ml    Physical Exam: Physical exam: Well-developed chronically ill appearing in no acute distress.  Skin is warm and dry.  HEENT is normal.  Neck is supple.  Chest is clear to auscultation with normal expansion.  Cardiovascular exam is regular rate and rhythm.  Abdominal exam nontender or distended. No masses palpated. Extremities show chronic skin changes and 3 + edema Diffuse anasarca neuro confused   Lab Results: Basic Metabolic Panel:  Recent Labs  16/10/96 0419 11/04/12 0416  NA 138 137  K 4.3 4.5  CL 101 101  CO2 25 26  GLUCOSE 150* 136*  BUN 42* 44*  CREATININE 1.31 1.51*  CALCIUM 9.4 9.7   CBC:  Recent Labs  11/02/12 0545 11/03/12 0419  WBC 6.0 5.2  HGB 11.5* 10.7*  HCT 34.9* 32.7*  MCV 88.6 88.1  PLT 208 183     Assessment/Plan:  1 acute systolic congestive heart failure-the patient remains markedly volume overloaded. Continue diuresis and follow renal function. His renal function is worse. Will add low dose milrinone. Place foley. 2 cardiomyopathy-continue ACE inhibitor and low-dose beta blocker. Plan is ultimately to proceed with right and left catheterization. However he is mildly confused; may need to delay.  3 confusion-workup in progress. 4 pneumonia-management per primary care.  Olga Millers 11/04/2012, 11:06 AM

## 2012-11-04 NOTE — Progress Notes (Signed)
MEDICATION RELATED CONSULT NOTE - INITIAL   Pharmacy Consult for [facility] Indication: CHF  No Known Allergies  Patient Measurements: Height: 5\' 10"  (177.8 cm) Weight: 218 lb 12.8 oz (99.247 kg) IBW/kg (Calculated) : 73  Vital Signs: Temp: 97.2 F (36.2 C) (08/03 0415) Temp src: Oral (08/03 0415) BP: 142/81 mmHg (08/03 1112) Pulse Rate: 89 (08/03 0853) Intake/Output from previous day: 08/02 0701 - 08/03 0700 In: 723 [P.O.:720; I.V.:3] Out: -  Intake/Output from this shift: Total I/O In: 120 [P.O.:120] Out: -   Labs:  Recent Labs  11/02/12 0545 11/03/12 0419 11/04/12 0416  WBC 6.0 5.2  --   HGB 11.5* 10.7*  --   HCT 34.9* 32.7*  --   PLT 208 183  --   CREATININE 1.28 1.31 1.51*   Estimated Creatinine Clearance: 53 ml/min (by C-G formula based on Cr of 1.51).   Microbiology: No results found for this or any previous visit (from the past 720 hour(s)).  Medical History: Past Medical History  Diagnosis Date  . Diabetes mellitus   . Hypertension   . Hyperlipidemia   . Cervical spinal stenosis   . Lumbar spinal stenosis   . 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 DVT (deep venous thrombosis)     RUE 10/2011  . Anemia   . Cardiomyopathy     a. 10/2012 Echo: EF 20-25%, diff HK, mod dil LA/RV/RA.    Medications:  Scheduled:  . aspirin  81 mg Oral Daily  . azithromycin  500 mg Oral Q24H  . carvedilol  3.125 mg Oral BID WC  . cefpodoxime  200 mg Oral Q12H  . fluticasone  2 spray Each Nare Daily  . furosemide  40 mg Intravenous TID  . guaiFENesin  1,200 mg Oral BID  . heparin  5,000 Units Subcutaneous Q8H  . insulin aspart  0-9 Units Subcutaneous TID WC  . lisinopril  10 mg Oral BID  . polyethylene glycol  17 g Oral Daily  . QUEtiapine  12.5 mg Oral QHS  . senna-docusate  2 tablet Oral BID  . simvastatin  20 mg Oral QPM  . sodium chloride  3 mL Intravenous Q12H   Infusions:  . milrinone      Assessment: 72 y/o male patient with  acute systolic CHF , volume overloaded, requiring ionotropic assistance, to begin milrinone per pharmacy. Given patients volume status, blood pressure and renal function, will begin at 0.332mcg/kg/min.  Plan: Begin milrinone at 0.366mcg/kg/min and monitor renal function with bp.  Verlene Mayer, PharmD, BCPS Pager [phone] 11/04/2012,11:48 AM

## 2012-11-05 DIAGNOSIS — I5021 Acute systolic (congestive) heart failure: Secondary | ICD-10-CM

## 2012-11-05 DIAGNOSIS — N179 Acute kidney failure, unspecified: Secondary | ICD-10-CM

## 2012-11-05 LAB — CBC
Hemoglobin: 9.7 g/dL — ABNORMAL LOW (ref 13.0–17.0)
MCH: 28.7 pg (ref 26.0–34.0)
Platelets: 215 10*3/uL (ref 150–400)
RBC: 3.38 MIL/uL — ABNORMAL LOW (ref 4.22–5.81)
WBC: 6.2 10*3/uL (ref 4.0–10.5)

## 2012-11-05 LAB — BASIC METABOLIC PANEL
Calcium: 9.5 mg/dL (ref 8.4–10.5)
GFR calc Af Amer: 55 mL/min — ABNORMAL LOW (ref >90)
GFR calc non Af Amer: 47 mL/min — ABNORMAL LOW (ref >90)
Potassium: 3.7 mEq/L (ref 3.5–5.1)
Sodium: 139 mEq/L (ref 135–145)

## 2012-11-05 LAB — GLUCOSE, CAPILLARY: Glucose-Capillary: 145 mg/dL — ABNORMAL HIGH (ref 70–99)

## 2012-11-05 MED ORDER — FUROSEMIDE 10 MG/ML IJ SOLN
40.0000 mg | Freq: Two times a day (BID) | INTRAMUSCULAR | Status: DC
  Administered 2012-11-05 – 2012-11-06 (×3): 40 mg via INTRAVENOUS
  Filled 2012-11-05 (×2): qty 4

## 2012-11-05 MED ORDER — SODIUM CHLORIDE 0.9 % IJ SOLN: 3.0000 mL | INTRAMUSCULAR | Status: DC | PRN

## 2012-11-05 MED ORDER — ASPIRIN 81 MG PO CHEW
324.0000 mg | CHEWABLE_TABLET | ORAL | Status: AC
  Administered 2012-11-06: 324 mg via ORAL
  Filled 2012-11-05: qty 4

## 2012-11-05 MED ORDER — SODIUM CHLORIDE 0.9 % IJ SOLN: 3.0000 mL | Freq: Two times a day (BID) | INTRAMUSCULAR | Status: DC

## 2012-11-05 MED ORDER — LISINOPRIL 5 MG PO TABS
5.0000 mg | ORAL_TABLET | Freq: Two times a day (BID) | ORAL | Status: DC
  Administered 2012-11-05 – 2012-11-09 (×8): 5 mg via ORAL
  Filled 2012-11-05 (×10): qty 1

## 2012-11-05 MED ORDER — SODIUM CHLORIDE 0.9 % IV SOLN: 250.0000 mL | INTRAVENOUS | Status: DC | PRN

## 2012-11-05 MED ORDER — POTASSIUM CHLORIDE CRYS ER 20 MEQ PO TBCR
40.0000 meq | EXTENDED_RELEASE_TABLET | Freq: Once | ORAL | Status: AC
  Administered 2012-11-05: 40 meq via ORAL
  Filled 2012-11-05: qty 2

## 2012-11-05 NOTE — Progress Notes (Signed)
Patient ID: [name], male   DOB: 1940/10/15, 72 y.o.   MRN: [id number]    SUBJECTIVE: Seems less confused this morning on milrinone than he did when I last saw him on Friday.  Breathing is better with diuresis, weight is down 17 lbs total. BP is low this morning, had been good until now.   Marland Kitchen aspirin  81 mg Oral Daily  . carvedilol  3.125 mg Oral BID WC  . cefpodoxime  200 mg Oral Q12H  . fluticasone  2 spray Each Nare Daily  . furosemide  40 mg Intravenous BID  . guaiFENesin  1,200 mg Oral BID  . heparin  5,000 Units Subcutaneous Q8H  . insulin aspart  0-9 Units Subcutaneous TID WC  . lisinopril  5 mg Oral BID  . polyethylene glycol  17 g Oral Daily  . potassium chloride  40 mEq Oral Once  . QUEtiapine  12.5 mg Oral QHS  . senna-docusate  2 tablet Oral BID  . simvastatin  20 mg Oral QPM  . sodium chloride  3 mL Intravenous Q12H  milrinone 0.25    Filed Vitals:   11/05/12 0333 11/05/12 0800 11/05/12 0802 11/05/12 0805  BP: 141/57 86/40 94/43  86/40  Pulse: 93 88 87   Temp: 98.8 F (37.1 C)  99.8 F (37.7 C)   TempSrc: Oral  Oral   Resp: 18  19   Height:      Weight: 93.033 kg (205 lb 1.6 oz)     SpO2: 95%  97%     Intake/Output Summary (Last 24 hours) at 11/05/12 0815 Last data filed at 11/05/12 0805  Gross per 24 hour  Intake    260 ml  Output   4500 ml  Net  -4240 ml    LABS: Basic Metabolic Panel:  Recent Labs  29/56/21 0416 11/05/12 0505  NA 137 139  K 4.5 3.7  CL 101 101  CO2 26 28  GLUCOSE 136* 136*  BUN 44* 41*  CREATININE 1.51* 1.44*  CALCIUM 9.7 9.5   Liver Function Tests: No results found for this basename: AST, ALT, ALKPHOS, BILITOT, PROT, ALBUMIN,  in the last 72 hours No results found for this basename: LIPASE, AMYLASE,  in the last 72 hours CBC:  Recent Labs  11/03/12 0419 11/05/12 0505  WBC 5.2 6.2  HGB 10.7* 9.7*  HCT 32.7* 29.5*  MCV 88.1 87.3  PLT 183 215   Cardiac Enzymes: No results found for this basename:  CKTOTAL, CKMB, CKMBINDEX, TROPONINI,  in the last 72 hours BNP: No components found with this basename: POCBNP,  D-Dimer: No results found for this basename: DDIMER,  in the last 72 hours Hemoglobin A1C: No results found for this basename: HGBA1C,  in the last 72 hours Fasting Lipid Panel: No results found for this basename: CHOL, HDL, LDLCALC, TRIG, CHOLHDL, LDLDIRECT,  in the last 72 hours Thyroid Function Tests: No results found for this basename: TSH, T4TOTAL, FREET3, T3FREE, THYROIDAB,  in the last 72 hours Anemia Panel: No results found for this basename: VITAMINB12, FOLATE, FERRITIN, TIBC, IRON, RETICCTPCT,  in the last 72 hours  RADIOLOGY: Dg Chest 2 View  10/29/2012   *RADIOLOGY REPORT*  Clinical Data: Possible pneumonia  CHEST - 2 VIEW  Comparison: 10/29/2012, 10/27/2011  Findings: There is a similarly poor inspiratory effect as on the prior study.  Bilateral lower lobe opacities are stable.  Cardiac silhouette is enlarged.  Probable small bilateral pleural effusions appreciated.  IMPRESSION: Poor inspiratory  effect.  Bilateral lower lobe opacities as well as probable small effusions.  Pneumonia is not excluded.   Original Report Authenticated By: Esperanza Heir, M.D.   Ct Head Wo Contrast  10/29/2012   *RADIOLOGY REPORT*  Clinical Data: Altered mental status, hypoglycemia, confusion  CT HEAD WITHOUT CONTRAST  Technique:  Contiguous axial images were obtained from the base of the skull through the vertex without contrast.  Comparison: 11/17/2011  Findings: Mild diffuse cortical volume loss noted with proportional ventricular prominence.  This finding is stable. No acute hemorrhage, acute infarction, or mass lesion is seen.  Right maxillary sinus air fluid level is noted.  No skull fracture. Orbits are unremarkable.  No soft tissue abnormality.  IMPRESSION: No acute intracranial finding.  Right maxillary sinusitis/air fluid level.   Original Report Authenticated By: Christiana Pellant, M.D.     Dg Chest Port 1 View  10/29/2012   *RADIOLOGY REPORT*  Clinical Data: Shortness of breath  PORTABLE CHEST - 1 VIEW  Comparison: 10/27/2011  Findings: Lungs are hypoaerated.  Patchy bilateral lower lobe airspace opacities are present with trace effusions.  No pneumothorax.  No acute osseous finding.  IMPRESSION: Hypoaeration with bibasilar patchy air space opacities and trace effusions which could represent multilobar pneumonia or atelectasis given the degree of hypoaeration. If the patient's symptoms continue, consider PA and lateral chest radiographs obtained at full inspiration when the patient is clinically able.   Original Report Authenticated By: Christiana Pellant, M.D.   Dg Abd 2 Views  10/30/2012   *RADIOLOGY REPORT*  Clinical Data: Generalized abdominal pain and distention. Constipation.  ABDOMEN - 2 VIEW  Comparison: None.  Findings: Bowel gas pattern unremarkable without evidence of obstruction or significant ileus.  No evidence of free air or significant air fluid levels on the lateral decubitus image.  Large stool burden throughout the colon from cecum to rectum.  No visible opaque urinary tract calculi.  Phleboliths low in the left side of the pelvis.  Degenerative changes involving the lower thoracic and lumbar spine.  Right hip arthroplasty with anatomic alignment.  IMPRESSION: No acute abdominal abnormality.  Large stool burden throughout the colon.   Original Report Authenticated By: Hulan Saas, M.D.    PHYSICAL EXAM General: NAD Neck: JVP 10-12 cm, no thyromegaly or thyroid nodule.  Lungs: Clear to auscultation bilaterally with normal respiratory effort. CV: Nondisplaced PMI.  Heart regular S1/S2, no S3/S4, no murmur.  2+ edema to knees bilaterally.  Abdomen: Soft, nontender, no hepatosplenomegaly, no distention.  Neurologic: Alert and oriented x 3. Knows he is at [facility].  Psych: Normal affect. Extremities: No clubbing or cyanosis.   TELEMETRY: Reviewed telemetry  pt in [facility]  ASSESSMENT AND PLAN: 1. Acute on chronic systolic CHF: EF 11-91% on echo, unknown cause of CMP. Has chronic LBBB. Volume overloaded on exam still but diuresed quite well yesterday with addition of milrinone.  BP low this morning but had been good yesterday and last night.  - Continue milrinone gtt today, will decrease to 0.25 mcg/kg/min - Decrease Lasix to 40 IV bid, will slow diuresis a bit with plan for cath tomorrow.  - Decrease lisinopril to 5 mg bid as BP is soft this morning. - Will keep Coreg at 3.125 mg bid but hold for SBP < 90.  - Will plan for LHC/RHC on Tuesday if creatinine remains stable to assess for CAD and also to assess hemodynamics.  2. PNA: Treating for PNA, now on po cefpodoxime.  3. Altered mental status: Initially  in setting of hypoglycemia. Also concerned that low output heart failure is playing a role. He seems more clear today on milrinone.  4. Renal: Creatinine 1.44 today, down from 1.51 tomorrow.  Plan for cath tomorrow if creatinine stays stable, will try to use minimal contrast and no LV-gram.   Marca Ancona 11/05/2012 8:20 AM

## 2012-11-05 NOTE — Progress Notes (Signed)
CSW spoke with wife, cousin and patient at bedside. Wife and cousin stated that they are going to discuss their SNF options and inform CSW tomorrow on their bed choice, either [facility], if they pay the outstanding balance, or [facility]. CSW will update tomorrow.   Maree Krabbe, MSW, Theresia Majors [phone]

## 2012-11-05 NOTE — Progress Notes (Signed)
[facility] PROGRESS NOTE  DANIL WEDGE HQI:[id number] DOB: May 20, 1940 DOA: 10/29/2012  PCP: [name], MD  Brief HPI: [name] is a 72 y.o. male with past medical history of diabetes mellitus, hypertension and history of RUE DVT. Patient brought to the hospital because of confusion. He was found to be hypoglycemic with CBG of 25. Initial pulse ox in the emergency department was 75% on room air, with minimal improvement on nasal cannula. Patient was placed on NRB. CBG improved to 96. Patient evaluated by chest x-ray and showed bibasilar pneumonia.His mental status did not improve much. He was seen by neurology who felt this was cognitive impairment. ECHo showed EF of 20% and he was initiated on Lasix for fluid overload. Started on Milrinone as well. Cardiology is following. Plan is for l and r cath 8/5.  Past medical history:  Past Medical History  Diagnosis Date  . Diabetes mellitus   . Hypertension   . Hyperlipidemia   . Cervical spinal stenosis   . Lumbar spinal stenosis   . 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 DVT (deep venous thrombosis)     RUE 10/2011  . Anemia   . Cardiomyopathy     a. 10/2012 Echo: EF 20-25%, diff HK, mod dil LA/RV/RA.    Consultants: [facility], Neurology  Procedures:  2D ECHO 7/30 Study Conclusions  - Left ventricle: The cavity size was severely dilated. Wall thickness was normal. Systolic function was severely reduced. The estimated ejection fraction was in the range of 20% to 25%. Diffuse hypokinesis. - Left atrium: The atrium was moderately dilated. - Right ventricle: The cavity size was moderately dilated. - Right atrium: The atrium was moderately dilated. - Pulmonary arteries: PA peak pressure: 34mm Hg (S). - Pericardium, extracardiac: There was a left pleuraleffusion.  Antibiotics: Ceftriaxone 7/28-->8/1 Azithromycin 7/28--> Vantin 8/1-->  Subjective: Patient continues to be confused. Denies  pain. No change. Perhaps not as agitated compared to last week.  Objective: Vital Signs  Filed Vitals:   11/05/12 0333 11/05/12 0800 11/05/12 0802 11/05/12 0805  BP: 141/57 86/40 94/43  86/40  Pulse: 93 88 87   Temp: 98.8 F (37.1 C)  99.8 F (37.7 C)   TempSrc: Oral  Oral   Resp: 18  19   Height:      Weight: 93.033 kg (205 lb 1.6 oz)     SpO2: 95%  97%     Intake/Output Summary (Last 24 hours) at 11/05/12 1058 Last data filed at 11/05/12 0805  Gross per 24 hour  Intake    140 ml  Output   4500 ml  Net  -4360 ml   Net Neg 7.6 ltrs so far.  Filed Weights   11/03/12 0612 11/04/12 0415 11/05/12 0333  Weight: 102.3 kg (225 lb 8.5 oz) 99.247 kg (218 lb 12.8 oz) 93.033 kg (205 lb 1.6 oz)    General appearance: alert, cooperative, appears stated age, distracted and no distress Resp: Crackles bilateral lower lung fields. No wheezing. Cardio: regular rate and rhythm, S1, S2 normal, no murmur, click, rub or gallop GI: soft, non-tender; bowel sounds normal; no masses,  no organomegaly Extremities: Wounds noted both legs. No erythema noted. Edema 2+ Pulses: 2+ and symmetric Neurologic: Alert. He knew he was in [place]. Oriented to wife. Not oriented to time, date, day, month, year. No cranial nerve deficits. No focal motor deficits noted.  Lab Results:  Basic Metabolic Panel:  Recent Labs Lab 10/30/12 0524 10/31/12 1421  11/01/12 0865 11/02/12 0545 11/03/12 0419 11/04/12 0416 11/05/12 0505  NA 136 133* 136 136 138 137 139  K 4.5 5.1 5.1 4.8 4.3 4.5 3.7  CL 102 100 101 102 101 101 101  CO2 24 24 24 20 25 26 28   GLUCOSE 130* 99 181* 133* 150* 136* 136*  BUN 33* 37* 43* 42* 42* 44* 41*  CREATININE 1.29 1.41* 1.41* 1.28 1.31 1.51* 1.44*  CALCIUM 9.7 9.5 9.2 9.4 9.4 9.7 9.5  MG  --  2.6*  --   --   --   --   --    Liver Function Tests:  Recent Labs Lab 10/29/12 1204 11/01/12 0822  AST 33 51*  ALT 17 30  ALKPHOS 118* 109  BILITOT 0.7 0.7  PROT 7.7 7.3  ALBUMIN 3.5  3.4*   CBC:  Recent Labs Lab 10/29/12 1204  10/30/12 0524 11/01/12 0822 11/02/12 0545 11/03/12 0419 11/05/12 0505  WBC 7.8  < > 4.8 6.3 6.0 5.2 6.2  NEUTROABS 5.9  --   --   --   --   --   --   HGB 10.1*  < > 9.8* 11.5* 11.5* 10.7* 9.7*  HCT 30.8*  < > 31.0* 34.0* 34.9* 32.7* 29.5*  MCV 89.0  < > 89.1 87.4 88.6 88.1 87.3  PLT 183  < > 176 202 208 183 215  < > = values in this interval not displayed. Cardiac Enzymes:  Recent Labs Lab 10/30/12 1733 10/31/12 0018 10/31/12 0440  TROPONINI <0.30 <0.30 <0.30   BNP (last 3 results)  Recent Labs  11/01/12 0822  PROBNP 13622.0*   CBG:  Recent Labs Lab 11/03/12 2106 11/04/12 0632 11/04/12 1105 11/04/12 1606 11/04/12 2137  GLUCAP 172* 114* 163* 158* 165*    No results found for this or any previous visit (from the past 240 hour(s)).    Studies/Results: No results found.  Medications:  Scheduled: . aspirin  81 mg Oral Daily  . carvedilol  3.125 mg Oral BID WC  . cefpodoxime  200 mg Oral Q12H  . fluticasone  2 spray Each Nare Daily  . furosemide  40 mg Intravenous BID  . guaiFENesin  1,200 mg Oral BID  . heparin  5,000 Units Subcutaneous Q8H  . insulin aspart  0-9 Units Subcutaneous TID WC  . lisinopril  5 mg Oral BID  . polyethylene glycol  17 g Oral Daily  . potassium chloride  40 mEq Oral Once  . QUEtiapine  12.5 mg Oral QHS  . senna-docusate  2 tablet Oral BID  . simvastatin  20 mg Oral QPM  . sodium chloride  3 mL Intravenous Q12H   Continuous: . milrinone 0.25 mcg/kg/min (11/05/12 0815)   HQI:[id number], acetaminophen, albuterol, alum & mag hydroxide-simeth, bisacodyl, HYDROcodone-acetaminophen, magnesium hydroxide, morphine injection, ondansetron (ZOFRAN) IV, ondansetron, simethicone  Assessment/Plan:  Principal Problem:   Acute encephalopathy Active Problems:   Falls frequently   Diabetes mellitus, type 2   HTN (hypertension)   DVT of right proximal brachial through the distal axillary  vein, acute   Hypoglycemia   Community acquired pneumonia   Dilated cardiomyopathy   Acute systolic CHF (congestive heart failure)    Acute encephalopathy  Was thought secondary to hypoglycemia and hypoxia. But hasn't improved despite improvement in glucose levels. Wife states he has been confused for last few months. MRI brain could not be done due to dyspnea. Appreciate neurology input. Could be confused due to low cardiac output as well. LFt's are  normal. Vit B12 and TSH normal. RPR is non reactive. Could have underlying dementia. Holding sedating agents. Seroquel initiated 8/1. Hasn't shown much improvement yet but not as agitated. Last EKG does not show prolonged Qt.  Newly detected Dilated Cardiomyopathy with Acute Systolic CHF EF is found to be 20% on ECHO with diffuse hypokinesis. Appreciate cardiology input. On aggressive diuresis and on Milrinone. Plan is for right and left heart cath on 8/5. BP noted to be low today. Milrinone and ACEI dose was decreased. Continue Coreg. Monitor renal function closely. Creatinine slightly better today. Diuresing well. Continue to monitor.  Community acquired pneumonia  Chest x-ray showed bibasilar infiltrates which was thought to be consistent with community acquired pneumonia. See above. Changed to oral antibiotics. Will stop after 7 days.  Hypoglycemia  Resolved. Patient was on Amaryl. Continue to hold oral hypoglycemic agents. A1C is 5.7.   Diabetes mellitus type 2  Started carbohydrate modified diet, SSI. Hemoglobin A1c is 5.7.   Hypoxia  Likely secondary to community acquired pneumonia versus CHF. This is improved, patient is on 2 L and his oxygen saturation is 100%. Will assess RA O2 when close to discharge.   Chronic bilateral lower extremity ulcers  Secondary to peripheral vascular disease, patient follows with outpatient wound clinic. No evidence of infection or recent change in the ulcers. Wound care consulted. Diuretics might  help.  History of right upper extremity DVT  Patient was on Xarelto in 2013, not taking it anymore.   Normocytic Anemia Anemia panel reviewed.  DVT Prophylaxis:   Heparin Code Status: full code  Family Communication: No family at bedside. Spoke to his wife over the phone. Disposition Plan: PT recommends SNF. Not ready for discharge. Right and left heart cath 8/5.    LOS: 7 days   [facility]  [facility] Pager [phone] 11/05/2012, 10:58 AM  If 8PM-8AM, please contact night-coverage at [email or web address], password [facility]

## 2012-11-05 NOTE — Progress Notes (Signed)
Physical Therapy Treatment Patient Details Name: [name] MRN: [id number] DOB: November 12, 1940 Today's Date: 11/05/2012 Time: 3086-5784 PT Time Calculation (min): 23 min  PT Assessment / Plan / Recommendation  History of Present Illness [name] is a 72 y.o. male with past medical history of diabetes mellitus, hypertension and history of RUE DVT. Patient brought to the hospital because of confusion. He was found to be hypoglycemic with CBG of 25. Initial pulse ox in the emergency department was 75% on room air, with minimal improvement on nasal cannula. Patient was placed on NRB. CBG improved to 96. Patient evaluated by chest x-ray and showed bibasilar pneumonia.   PT Comments   Patient able to increase ambulation distance today but still demonstrates deficits with safety and stability. Pt also continues to require assist for basic transfers. Will continue to see patient and progress activity as tolerated. DC recommendation still SNF.  Follow Up Recommendations  SNF           Equipment Recommendations  Other (comment) (TBD)    Recommendations for Other Services Other (comment) (pt may benefit from psych consult)  Frequency Min 3X/week   Progress towards PT Goals Progress towards PT goals: Progressing toward goals  Plan Current plan remains appropriate    Precautions / Restrictions Precautions Precautions: Fall Restrictions Weight Bearing Restrictions: No   Pertinent Vitals/Pain No pain reported at this time    Mobility  Bed Mobility Bed Mobility: Not assessed Details for Bed Mobility Assistance: received in recliner Transfers Transfers: Sit to Stand;Stand to Sit Sit to Stand: 4: Min guard Stand to Sit: 4: Min assist;With armrests;To chair/3-in-1 Details for Transfer Assistance: VCs for hand placement, still difficulty with stability with transfers Ambulation/Gait Ambulation/Gait Assistance: 4: Min guard;4: Min assist Ambulation Distance (Feet): 600  Feet Assistive device: Rolling walker Ambulation/Gait Assistance Details: Continues to struggle with porper use of assistive device and stability requiring min guard and at time min assist.  Gait Pattern: Step-through pattern;Decreased stride length;Trunk flexed Gait velocity: decreased General Gait Details: Patient with improvements in gait and functional mobility despite LE pain    Exercises General Exercises - Lower Extremity Ankle Circles/Pumps: AROM;Both;10 reps Straight Leg Raises: AROM;AAROM;Both;10 reps (difficulty with SLR secondary to fatigue and discomfort)     PT Goals (current goals can now be found in the care plan section) Acute Rehab PT Goals Patient Stated Goal: to go home PT Goal Formulation: With patient Time For Goal Achievement: 11/14/12 Potential to Achieve Goals: Fair  Visit Information  Last PT Received On: 11/05/12 Assistance Needed: +1 History of Present Illness: [name] is a 72 y.o. male with past medical history of diabetes mellitus, hypertension and history of RUE DVT. Patient brought to the hospital because of confusion. He was found to be hypoglycemic with CBG of 25. Initial pulse ox in the emergency department was 75% on room air, with minimal improvement on nasal cannula. Patient was placed on NRB. CBG improved to 96. Patient evaluated by chest x-ray and showed bibasilar pneumonia.    Subjective Data  Subjective: I feel a little stronger, would love a diet dr pepper and sugar free gum Patient Stated Goal: to go home   Cognition  Cognition Arousal/Alertness: Awake/alert Behavior During Therapy: WFL for tasks assessed/performed Overall Cognitive Status: Impaired/Different from baseline Area of Impairment: Memory;Safety/judgement;Awareness;Problem solving Memory: Decreased short-term memory General Comments: Pt much improved and pleasent today.    Balance  Balance Balance Assessed: Yes Static Standing Balance Static Standing - Balance  Support:  No upper extremity supported Static Standing - Level of Assistance: 5: Stand by assistance High Level Balance High Level Balance Activites: Side stepping;Backward walking;Direction changes;Turns High Level Balance Comments: no physical assist needed, cues for position within rw  End of Session PT - End of Session Equipment Utilized During Treatment: Gait belt Activity Tolerance: Treatment limited secondary to agitation Patient left: in chair;with call bell/phone within reach;with nursing/sitter in room Nurse Communication: Mobility status   GP     Fabio Asa 11/05/2012, 11:10 AM Charlotte Crumb, PT DPT  [phone]

## 2012-11-06 ENCOUNTER — Encounter (HOSPITAL_COMMUNITY)
Admission: EM | Disposition: A | Discharge status: Skilled Nursing Facility | Payer: Self-pay | Source: Home / Self Care | Attending: Internal Medicine

## 2012-11-06 DIAGNOSIS — I2510 Atherosclerotic heart disease of native coronary artery without angina pectoris: Secondary | ICD-10-CM

## 2012-11-06 HISTORY — PX: LEFT AND RIGHT HEART CATHETERIZATION WITH CORONARY ANGIOGRAM: SHX5449

## 2012-11-06 LAB — CBC
HCT: 27.7 % — ABNORMAL LOW (ref 39.0–52.0)
Hemoglobin: 9.5 g/dL — ABNORMAL LOW (ref 13.0–17.0)
RDW: 18.2 % — ABNORMAL HIGH (ref 11.5–15.5)
WBC: 5.6 10*3/uL (ref 4.0–10.5)

## 2012-11-06 LAB — GLUCOSE, CAPILLARY

## 2012-11-06 LAB — BASIC METABOLIC PANEL
Chloride: 99 mEq/L (ref 96–112)
Creatinine, Ser: 1.47 mg/dL — ABNORMAL HIGH (ref 0.50–1.35)
GFR calc Af Amer: 54 mL/min — ABNORMAL LOW (ref >90)
Potassium: 4 mEq/L (ref 3.5–5.1)

## 2012-11-06 SURGERY — LEFT AND RIGHT HEART CATHETERIZATION WITH CORONARY ANGIOGRAM
Anesthesia: LOCAL

## 2012-11-06 SURGERY — LEFT AND RIGHT HEART CATHETERIZATION WITH CORONARY ANGIOGRAM
Anesthesia: Moderate Sedation

## 2012-11-06 MED ORDER — HEPARIN (PORCINE) IN NACL 2-0.9 UNIT/ML-% IJ SOLN
INTRAMUSCULAR | Status: AC
Start: 2012-11-06 — End: 2012-11-06
  Filled 2012-11-06: qty 1000

## 2012-11-06 MED ORDER — NITROGLYCERIN 0.2 MG/ML ON CALL CATH LAB
INTRAVENOUS | Status: AC
  Filled 2012-11-06: qty 1

## 2012-11-06 MED ORDER — SODIUM CHLORIDE 0.9 % IJ SOLN: 3.0000 mL | Freq: Two times a day (BID) | INTRAMUSCULAR | Status: DC

## 2012-11-06 MED ORDER — MIDAZOLAM HCL 2 MG/2ML IJ SOLN
INTRAMUSCULAR | Status: AC
  Filled 2012-11-06: qty 2

## 2012-11-06 MED ORDER — LIDOCAINE HCL (PF) 1 % IJ SOLN
INTRAMUSCULAR | Status: AC
  Filled 2012-11-06: qty 30

## 2012-11-06 MED ORDER — FUROSEMIDE 10 MG/ML IJ SOLN
40.0000 mg | Freq: Three times a day (TID) | INTRAMUSCULAR | Status: DC
  Administered 2012-11-06 – 2012-11-07 (×2): 40 mg via INTRAVENOUS
  Filled 2012-11-06 (×3): qty 4

## 2012-11-06 MED ORDER — METOPROLOL TARTRATE 1 MG/ML IV SOLN
INTRAVENOUS | Status: AC
  Filled 2012-11-06: qty 5

## 2012-11-06 MED ORDER — SODIUM CHLORIDE 0.9 % IV SOLN: 250.0000 mL | INTRAVENOUS | Status: DC | PRN

## 2012-11-06 MED ORDER — FENTANYL CITRATE 0.05 MG/ML IJ SOLN
INTRAMUSCULAR | Status: AC
  Filled 2012-11-06: qty 2

## 2012-11-06 MED ORDER — SODIUM CHLORIDE 0.9 % IJ SOLN: 3.0000 mL | INTRAMUSCULAR | Status: DC | PRN

## 2012-11-06 MED ORDER — ONDANSETRON HCL 4 MG/2ML IJ SOLN: 4.0000 mg | Freq: Four times a day (QID) | INTRAMUSCULAR | Status: DC | PRN

## 2012-11-06 MED ORDER — ACETAMINOPHEN 325 MG PO TABS
650.0000 mg | ORAL_TABLET | ORAL | Status: DC | PRN
  Administered 2012-11-07: 650 mg via ORAL

## 2012-11-06 MED ORDER — ATORVASTATIN CALCIUM 40 MG PO TABS
40.0000 mg | ORAL_TABLET | Freq: Every day | ORAL | Status: DC
  Administered 2012-11-06 – 2012-11-08 (×3): 40 mg via ORAL
  Filled 2012-11-06 (×4): qty 1

## 2012-11-06 NOTE — Progress Notes (Signed)
Patient ID: [name], male   DOB: Nov 10, 1940, 72 y.o.   MRN: [id number]    SUBJECTIVE: Diuresed well again yesterday.  Weight down again.  He refused labs this morning.  Remembers that we talked about doing a cardiac catheterization today.    Marland Kitchen aspirin  81 mg Oral Daily  . carvedilol  3.125 mg Oral BID WC  . cefpodoxime  200 mg Oral Q12H  . fluticasone  2 spray Each Nare Daily  . furosemide  40 mg Intravenous BID  . guaiFENesin  1,200 mg Oral BID  . heparin  5,000 Units Subcutaneous Q8H  . insulin aspart  0-9 Units Subcutaneous TID WC  . lisinopril  5 mg Oral BID  . polyethylene glycol  17 g Oral Daily  . QUEtiapine  12.5 mg Oral QHS  . senna-docusate  2 tablet Oral BID  . simvastatin  20 mg Oral QPM  . sodium chloride  3 mL Intravenous Q12H  . sodium chloride  3 mL Intravenous Q12H  milrinone 0.25    Filed Vitals:   11/05/12 1214 11/05/12 1339 11/05/12 2013 11/06/12 0449  BP: 128/68 129/67 120/49 152/84  Pulse: 94 97 96 87  Temp:  98.9 F (37.2 C) 98.4 F (36.9 C) 98.5 F (36.9 C)  TempSrc:  Oral Oral Oral  Resp:  18 18 18   Height:      Weight:    86.2 kg (190 lb 0.6 oz)  SpO2:  93% 96% 95%    Intake/Output Summary (Last 24 hours) at 11/06/12 0707 Last data filed at 11/06/12 0600  Gross per 24 hour  Intake    620 ml  Output   3875 ml  Net  -3255 ml    LABS: Basic Metabolic Panel:  Recent Labs  65/78/46 0416 11/05/12 0505  NA 137 139  K 4.5 3.7  CL 101 101  CO2 26 28  GLUCOSE 136* 136*  BUN 44* 41*  CREATININE 1.51* 1.44*  CALCIUM 9.7 9.5   Liver Function Tests: No results found for this basename: AST, ALT, ALKPHOS, BILITOT, PROT, ALBUMIN,  in the last 72 hours No results found for this basename: LIPASE, AMYLASE,  in the last 72 hours CBC:  Recent Labs  11/05/12 0505  WBC 6.2  HGB 9.7*  HCT 29.5*  MCV 87.3  PLT 215   Cardiac Enzymes: No results found for this basename: CKTOTAL, CKMB, CKMBINDEX, TROPONINI,  in the last 72  hours BNP: No components found with this basename: POCBNP,  D-Dimer: No results found for this basename: DDIMER,  in the last 72 hours Hemoglobin A1C: No results found for this basename: HGBA1C,  in the last 72 hours Fasting Lipid Panel: No results found for this basename: CHOL, HDL, LDLCALC, TRIG, CHOLHDL, LDLDIRECT,  in the last 72 hours Thyroid Function Tests: No results found for this basename: TSH, T4TOTAL, FREET3, T3FREE, THYROIDAB,  in the last 72 hours Anemia Panel: No results found for this basename: VITAMINB12, FOLATE, FERRITIN, TIBC, IRON, RETICCTPCT,  in the last 72 hours  RADIOLOGY: Dg Chest 2 View  10/29/2012   *RADIOLOGY REPORT*  Clinical Data: Possible pneumonia  CHEST - 2 VIEW  Comparison: 10/29/2012, 10/27/2011  Findings: There is a similarly poor inspiratory effect as on the prior study.  Bilateral lower lobe opacities are stable.  Cardiac silhouette is enlarged.  Probable small bilateral pleural effusions appreciated.  IMPRESSION: Poor inspiratory effect.  Bilateral lower lobe opacities as well as probable small effusions.  Pneumonia is not excluded.  Original Report Authenticated By: Esperanza Heir, M.D.   Ct Head Wo Contrast  10/29/2012   *RADIOLOGY REPORT*  Clinical Data: Altered mental status, hypoglycemia, confusion  CT HEAD WITHOUT CONTRAST  Technique:  Contiguous axial images were obtained from the base of the skull through the vertex without contrast.  Comparison: 11/17/2011  Findings: Mild diffuse cortical volume loss noted with proportional ventricular prominence.  This finding is stable. No acute hemorrhage, acute infarction, or mass lesion is seen.  Right maxillary sinus air fluid level is noted.  No skull fracture. Orbits are unremarkable.  No soft tissue abnormality.  IMPRESSION: No acute intracranial finding.  Right maxillary sinusitis/air fluid level.   Original Report Authenticated By: Christiana Pellant, M.D.   Dg Chest Port 1 View  10/29/2012   *RADIOLOGY  REPORT*  Clinical Data: Shortness of breath  PORTABLE CHEST - 1 VIEW  Comparison: 10/27/2011  Findings: Lungs are hypoaerated.  Patchy bilateral lower lobe airspace opacities are present with trace effusions.  No pneumothorax.  No acute osseous finding.  IMPRESSION: Hypoaeration with bibasilar patchy air space opacities and trace effusions which could represent multilobar pneumonia or atelectasis given the degree of hypoaeration. If the patient's symptoms continue, consider PA and lateral chest radiographs obtained at full inspiration when the patient is clinically able.   Original Report Authenticated By: Christiana Pellant, M.D.   Dg Abd 2 Views  10/30/2012   *RADIOLOGY REPORT*  Clinical Data: Generalized abdominal pain and distention. Constipation.  ABDOMEN - 2 VIEW  Comparison: None.  Findings: Bowel gas pattern unremarkable without evidence of obstruction or significant ileus.  No evidence of free air or significant air fluid levels on the lateral decubitus image.  Large stool burden throughout the colon from cecum to rectum.  No visible opaque urinary tract calculi.  Phleboliths low in the left side of the pelvis.  Degenerative changes involving the lower thoracic and lumbar spine.  Right hip arthroplasty with anatomic alignment.  IMPRESSION: No acute abdominal abnormality.  Large stool burden throughout the colon.   Original Report Authenticated By: Hulan Saas, M.D.    PHYSICAL EXAM General: NAD Neck: JVP 10-12 cm, no thyromegaly or thyroid nodule.  Lungs: Clear to auscultation bilaterally with normal respiratory effort. CV: Nondisplaced PMI.  Heart regular S1/S2, no S3/S4, no murmur.  2+ edema to knees bilaterally.  Abdomen: Soft, nontender, no hepatosplenomegaly, no distention.  Neurologic: Alert and oriented x 3. Knows he is at [facility].  Psych: Normal affect. Extremities: No clubbing or cyanosis.   TELEMETRY: Reviewed telemetry pt in NSR  ASSESSMENT AND PLAN: 1. Acute on  chronic systolic CHF: EF 16-10% on echo, unknown cause of CMP. Has chronic LBBB. Volume overloaded on exam still but diuresed well again on milrinone.  BP ok this morning.  No labs yet. - Continue milrinone gtt today along with current Lasix dose.  - Continue current Coreg and lisinopril. - He says he will let phlebotomy draw his labs now.  Will plan for LHC/RHC today if creatinine remains stable to assess for CAD and also to assess hemodynamics.  If creatinine rising, may just do RHC.  2. PNA: Treating for PNA, now on po cefpodoxime.  3. Altered mental status: Initially in setting of hypoglycemia. Also concerned that low output heart failure was playing a role. Suspect some underlying dementia. 4. Renal: Creatinine lower yesterday, decreased Lasix and lisinopril.  Still awaiting lab draw this morning.  5. Will need SNF at discharge.    Marca Ancona 11/06/2012 7:07  AM    

## 2012-11-06 NOTE — Progress Notes (Signed)
[facility] PROGRESS NOTE  [name] WUJ:[id number] DOB: 17-May-1940 DOA: 10/29/2012  PCP: [name], MD  Brief HPI: [name] is a 72 y.o. male with past medical history of diabetes mellitus, hypertension and history of RUE DVT. Patient brought to the hospital because of confusion. He was found to be hypoglycemic with CBG of 25. Initial pulse ox in the emergency department was 75% on room air, with minimal improvement on nasal cannula. Patient was placed on NRB. CBG improved to 96. Patient evaluated by chest x-ray and showed bibasilar pneumonia.His mental status did not improve much. He was seen by neurology who felt this was cognitive impairment. ECHo showed EF of 20% and he was initiated on Lasix for fluid overload. Started on Milrinone as well. Cardiology is following. Plan is for l and r cath 8/5.  Past medical history:  Past Medical History  Diagnosis Date  . Diabetes mellitus   . Hypertension   . Hyperlipidemia   . Cervical spinal stenosis   . Lumbar spinal stenosis   . 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 DVT (deep venous thrombosis)     RUE 10/2011  . Anemia   . Cardiomyopathy     a. 10/2012 Echo: EF 20-25%, diff HK, mod dil LA/RV/RA.    Consultants: [facility], Neurology  Procedures:  2D ECHO 7/30 Study Conclusions  - Left ventricle: The cavity size was severely dilated. Wall thickness was normal. Systolic function was severely reduced. The estimated ejection fraction was in the range of 20% to 25%. Diffuse hypokinesis. - Left atrium: The atrium was moderately dilated. - Right ventricle: The cavity size was moderately dilated. - Right atrium: The atrium was moderately dilated. - Pulmonary arteries: PA peak pressure: 34mm Hg (S). - Pericardium, extracardiac: There was a left pleuraleffusion.  Antibiotics: Ceftriaxone 7/28-->8/1 Azithromycin 7/28-->8/3 Vantin 8/1-->8/5  Subjective: Patient continues to be confused but  slightly better. Denies pain. Not as agitated compared to last week.  Objective: Vital Signs  Filed Vitals:   11/05/12 1214 11/05/12 1339 11/05/12 2013 11/06/12 0449  BP: 128/68 129/67 120/49 152/84  Pulse: 94 97 96 87  Temp:  98.9 F (37.2 C) 98.4 F (36.9 C) 98.5 F (36.9 C)  TempSrc:  Oral Oral Oral  Resp:  18 18 18   Height:      Weight:    86.2 kg (190 lb 0.6 oz)  SpO2:  93% 96% 95%    Intake/Output Summary (Last 24 hours) at 11/06/12 1005 Last data filed at 11/06/12 0600  Gross per 24 hour  Intake    480 ml  Output   3875 ml  Net  -3395 ml   Net Neg 11.8 ltrs as of 8/5.  Filed Weights   11/04/12 0415 11/05/12 0333 11/06/12 0449  Weight: 99.247 kg (218 lb 12.8 oz) 93.033 kg (205 lb 1.6 oz) 86.2 kg (190 lb 0.6 oz)    General appearance: alert, cooperative, appears stated age, distracted and no distress Resp: Crackles bilateral lower lung fields. No wheezing. Cardio: regular rate and rhythm, S1, S2 normal, no murmur, click, rub or gallop GI: soft, non-tender; bowel sounds normal; no masses,  no organomegaly Extremities: Wounds noted both legs. No erythema noted. Edema 2+ Pulses: 2+ and symmetric Neurologic: Alert. He knew he was in [place]. Oriented to wife. Oriented to year. Not oriented to time, date, day, month. No cranial nerve deficits. No focal motor deficits noted.  Lab Results:  Basic Metabolic Panel:  Recent  Labs Lab 10/31/12 1421  11/02/12 0545 11/03/12 0419 11/04/12 0416 11/05/12 0505 11/06/12 0802  NA 133*  < > 136 138 137 139 138  K 5.1  < > 4.8 4.3 4.5 3.7 4.0  CL 100  < > 102 101 101 101 99  CO2 24  < > 20 25 26 28 31   GLUCOSE 99  < > 133* 150* 136* 136* 139*  BUN 37*  < > 42* 42* 44* 41* 41*  CREATININE 1.41*  < > 1.28 1.31 1.51* 1.44* 1.47*  CALCIUM 9.5  < > 9.4 9.4 9.7 9.5 9.4  MG 2.6*  --   --   --   --   --   --   < > = values in this interval not displayed. Liver Function Tests:  Recent Labs Lab 11/01/12 0822  AST 51*  ALT 30   ALKPHOS 109  BILITOT 0.7  PROT 7.3  ALBUMIN 3.4*   CBC:  Recent Labs Lab 11/01/12 0822 11/02/12 0545 11/03/12 0419 11/05/12 0505 11/06/12 0802  WBC 6.3 6.0 5.2 6.2 5.6  HGB 11.5* 11.5* 10.7* 9.7* 9.5*  HCT 34.0* 34.9* 32.7* 29.5* 27.7*  MCV 87.4 88.6 88.1 87.3 86.3  PLT 202 208 183 215 184   Cardiac Enzymes:  Recent Labs Lab 10/30/12 1733 10/31/12 0018 10/31/12 0440  TROPONINI <0.30 <0.30 <0.30   BNP (last 3 results)  Recent Labs  11/01/12 0822  PROBNP 13622.0*   CBG:  Recent Labs Lab 11/04/12 2137 11/05/12 1104 11/05/12 1653 11/05/12 2042 11/06/12 0558  GLUCAP 165* 145* 294* 203* 154*    No results found for this or any previous visit (from the past 240 hour(s)).    Studies/Results: No results found.  Medications:  Scheduled: . aspirin  81 mg Oral Daily  . carvedilol  3.125 mg Oral BID WC  . cefpodoxime  200 mg Oral Q12H  . fluticasone  2 spray Each Nare Daily  . furosemide  40 mg Intravenous BID  . guaiFENesin  1,200 mg Oral BID  . heparin  5,000 Units Subcutaneous Q8H  . insulin aspart  0-9 Units Subcutaneous TID WC  . lisinopril  5 mg Oral BID  . polyethylene glycol  17 g Oral Daily  . QUEtiapine  12.5 mg Oral QHS  . senna-docusate  2 tablet Oral BID  . simvastatin  20 mg Oral QPM  . sodium chloride  3 mL Intravenous Q12H  . sodium chloride  3 mL Intravenous Q12H   Continuous: . milrinone 0.25 mcg/kg/min (11/06/12 0828)   ZOX:WRUEAV chloride, acetaminophen, acetaminophen, albuterol, alum & mag hydroxide-simeth, bisacodyl, HYDROcodone-acetaminophen, magnesium hydroxide, morphine injection, ondansetron (ZOFRAN) IV, ondansetron, simethicone, sodium chloride  Assessment/Plan:  Principal Problem:   Acute encephalopathy Active Problems:   Falls frequently   Diabetes mellitus, type 2   HTN (hypertension)   DVT of right proximal brachial through the distal axillary vein, acute   Hypoglycemia   Community acquired pneumonia   Dilated  cardiomyopathy   Acute systolic CHF (congestive heart failure)    Acute encephalopathy  Was thought secondary to hypoglycemia and hypoxia. But hasn't improved despite improvement in glucose levels. Wife states he has been confused for last few months. MRI brain could not be done due to dyspnea. Appreciate neurology input. Could be confused due to low cardiac output as well. LFt's are normal. Vit B12 and TSH normal. RPR is non reactive. Could have underlying dementia. Holding sedating agents. Seroquel initiated 8/1. Hasn't shown much improvement yet but not  as agitated. Last EKG does not show prolonged Qt.  Newly detected Dilated Cardiomyopathy with Acute Systolic CHF EF is found to be 20% on ECHO with diffuse hypokinesis. Appreciate cardiology input. On aggressive diuresis and on Milrinone. Plan is for right and left heart cath on 8/5. BP noted to be low today. Milrinone and ACEI dose was decreased. Continue Coreg. Monitor renal function closely. Creatinine slightly better today. Diuresing well (11.8ltrs as of 8/5). Continue to monitor. Creatinine is elevated slightly but stable for most part.   Community acquired pneumonia  Chest x-ray showed bibasilar infiltrates which was thought to be consistent with community acquired pneumonia. See above. Has completed course of antibiotics.   Hypoglycemia  Resolved. Patient was on Amaryl. Continue to hold oral hypoglycemic agents. A1C is 5.7.   Diabetes mellitus type 2  Started carbohydrate modified diet, SSI. Hemoglobin A1c is 5.7.   Hypoxia  Likely secondary to community acquired pneumonia versus CHF. This is improved. Patient now saturating well on room air.   Chronic bilateral lower extremity ulcers  Secondary to peripheral vascular disease, patient follows with outpatient wound clinic. No evidence of infection or recent change in the ulcers. Wound care consulted. Diuretics appear to be helping as well.   History of right upper extremity DVT   Patient was on Xarelto in 2013, not taking it anymore.   Normocytic Anemia Anemia panel reviewed. No deficiencies noted.  DVT Prophylaxis:   Heparin Code Status: full code  Family Communication: Wife is at bedside. Discussed.  Disposition Plan: PT recommends SNF. Not ready for discharge. Right and left heart cath 8/5.    LOS: 8 days   [facility]  [facility] Pager [phone] 11/06/2012, 10:05 AM  If 8PM-8AM, please contact night-coverage at [email or web address], password [email or web address]

## 2012-11-06 NOTE — Progress Notes (Signed)
PHYSICAL THERAPY CANCELLATION NOTE: Pt in Cath lab, will follow up tomorrow for therapies.  Charlotte Crumb, PT DPT  [phone]

## 2012-11-06 NOTE — CV Procedure (Signed)
   Cardiac Catheterization Procedure Note  Name: [name] MRN: [id number] DOB: 1940/07/21  Procedure: Right Heart Cath, Left Heart Cath, Selective Coronary Angiography  Indication: CHF, LV dysfunction.    Procedural Details: The right groin was prepped, draped, and anesthetized with 1% lidocaine. Using the modified Seldinger technique a 5 French sheath was placed in the right femoral artery and a 7 French sheath was placed in the right femoral vein. A Swan-Ganz catheter was used for the right heart catheterization. Standard protocol was followed for recording of right heart pressures and sampling of oxygen saturations. Fick cardiac output was calculated. Standard Judkins catheters were used for selective coronary angiography.  No left ventriculogram was done due to elevated creatinine. There were no immediate procedural complications. The patient was transferred to the post catheterization recovery area for further monitoring.  Procedural Findings: Hemodynamics (mmHg) RA mean 13 RV 63/10 PA 66/35, mean 48 PCWP mean 20 LV 129/21 AO 124/67  Oxygen saturations: PA 55% AO 90%  Cardiac Output (Fick) 6  Cardiac Index (Fick) 2.9  PVR  4.7 WU  Coronary angiography: Coronary dominance: right  Left mainstem: No significant disease.   Left anterior descending (LAD): 40% mid LAD stenosis with diffuse luminal irregularities.  The LAD provides collaterals to RCA territory.   Left circumflex (LCx): Relatively small vessel. There was a 90% stenosis in the proximal LCx just prior to the take-off of a moderate OM1.  There was a moderate ramus with 50% proximal stenosis and diffuse disease in distal branches.   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   Left ventriculography: Not done (elevated creatinine).   Final Conclusions:  Suspect patient has an ischemic cardiomyopathy.  The RCA is occluded with  collaterals and the proximal LCx has a tight stenosis.  Patient's symptoms have been dyspnea related to volume overload, no chest pain.  For now, will medically manage to get fluid off.  Will review films with colleagues regarding intervention on the tight LCx stenosis.   Filling pressures remain elevated, R>L.  Will continue diuresis and milrinone.  CI is good on milrinone.   Marca Ancona 11/06/2012, 12:17 PM

## 2012-11-07 DIAGNOSIS — I2510 Atherosclerotic heart disease of native coronary artery without angina pectoris: Secondary | ICD-10-CM

## 2012-11-07 LAB — POCT I-STAT 3, VENOUS BLOOD GAS (G3P V)
Acid-Base Excess: 4 mmol/L — ABNORMAL HIGH (ref 0.0–2.0)
Bicarbonate: 30.1 mEq/L — ABNORMAL HIGH (ref 20.0–24.0)
O2 Saturation: 55 %
TCO2: 32 mmol/L (ref 0–100)
pCO2, Ven: 47.9 mmHg (ref 45.0–50.0)
pH, Ven: 7.407 — ABNORMAL HIGH (ref 7.250–7.300)

## 2012-11-07 LAB — GLUCOSE, CAPILLARY
Glucose-Capillary: 131 mg/dL — ABNORMAL HIGH (ref 70–99)
Glucose-Capillary: 179 mg/dL — ABNORMAL HIGH (ref 70–99)
Glucose-Capillary: 198 mg/dL — ABNORMAL HIGH (ref 70–99)

## 2012-11-07 LAB — POCT I-STAT 3, ART BLOOD GAS (G3+)
TCO2: 32 mmol/L (ref 0–100)
pCO2 arterial: 43.6 mmHg (ref 35.0–45.0)
pH, Arterial: 7.455 — ABNORMAL HIGH (ref 7.350–7.450)

## 2012-11-07 LAB — BASIC METABOLIC PANEL
BUN: 36 mg/dL — ABNORMAL HIGH (ref 6–23)
CO2: 28 mEq/L (ref 19–32)
GFR calc non Af Amer: 52 mL/min — ABNORMAL LOW (ref >90)
Glucose, Bld: 214 mg/dL — ABNORMAL HIGH (ref 70–99)
Potassium: 4.1 mEq/L (ref 3.5–5.1)

## 2012-11-07 LAB — CBC
MCH: 29.3 pg (ref 26.0–34.0)
MCHC: 33.8 g/dL (ref 30.0–36.0)
Platelets: 192 10*3/uL (ref 150–400)
RBC: 3.75 MIL/uL — ABNORMAL LOW (ref 4.22–5.81)
RDW: 18.3 % — ABNORMAL HIGH (ref 11.5–15.5)

## 2012-11-07 MED ORDER — QUETIAPINE FUMARATE 25 MG PO TABS
25.0000 mg | ORAL_TABLET | Freq: Every day | ORAL | Status: DC
  Administered 2012-11-07 – 2012-11-08 (×2): 25 mg via ORAL
  Filled 2012-11-07 (×3): qty 1

## 2012-11-07 MED ORDER — HALOPERIDOL LACTATE 5 MG/ML IJ SOLN
2.0000 mg | Freq: Four times a day (QID) | INTRAMUSCULAR | Status: DC | PRN
  Filled 2012-11-07: qty 0.4

## 2012-11-07 MED ORDER — CARVEDILOL 6.25 MG PO TABS
6.2500 mg | ORAL_TABLET | Freq: Two times a day (BID) | ORAL | Status: DC
  Administered 2012-11-07 – 2012-11-09 (×4): 6.25 mg via ORAL
  Filled 2012-11-07 (×6): qty 1

## 2012-11-07 MED ORDER — HALOPERIDOL LACTATE 5 MG/ML IJ SOLN
0.5000 mg | Freq: Once | INTRAMUSCULAR | Status: AC
  Administered 2012-11-07: 0.5 mg via INTRAMUSCULAR
  Filled 2012-11-07: qty 0.1

## 2012-11-07 MED ORDER — LORAZEPAM 0.5 MG PO TABS: 0.5000 mg | ORAL_TABLET | Freq: Four times a day (QID) | ORAL | Status: DC | PRN
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
## 2012-11-07 NOTE — Progress Notes (Signed)
Pt. Refused morning assessment of vital signs.  Will continue to monitor.   Thane Edu, RN

## 2012-11-07 NOTE — Progress Notes (Signed)
Chart reviewed.  [facility] PROGRESS NOTE  ROCKNEY GRENZ NWG:[id number] DOB: [birth year] DOA: 10/29/2012  PCP: [name], MD  Brief HPI: [name] is a 71 y.o. male with past medical history of diabetes mellitus, hypertension and history of RUE DVT. Patient brought to the hospital because of confusion. He was found to be hypoglycemic with CBG of 25. Initial pulse ox in the emergency department was 75% on room air, with minimal improvement on nasal cannula. Patient was placed on NRB. CBG improved to 96. Patient evaluated by chest x-ray and showed bibasilar pneumonia.His mental status did not improve much. He was seen by neurology who felt this was cognitive impairment. ECHo showed EF of 20% and he was initiated on Lasix for fluid overload. Started on Milrinone as well. Cardiology is following.  Past medical history:  Past Medical History  Diagnosis Date  . Diabetes mellitus   . Hypertension   . Hyperlipidemia   . Cervical spinal stenosis   . Lumbar spinal stenosis   . 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 DVT (deep venous thrombosis)     RUE 10/2011  . Anemia   . Cardiomyopathy     a. 10/2012 Echo: EF 20-25%, diff HK, mod dil LA/RV/RA.    Consultants: [facility], Neurology  Procedures:  2D ECHO 7/30 Study Conclusions  - Left ventricle: The cavity size was severely dilated. Wall thickness was normal. Systolic function was severely reduced. The estimated ejection fraction was in the range of 20% to 25%. Diffuse hypokinesis. - Left atrium: The atrium was moderately dilated. - Right ventricle: The cavity size was moderately dilated. - Right atrium: The atrium was moderately dilated. - Pulmonary arteries: PA peak pressure: 34mm Hg (S). - Pericardium, extracardiac: There was a left pleuraleffusion.  Antibiotics: Ceftriaxone 7/28-->8/1 Azithromycin 7/28-->8/3 Vantin 8/1-->8/5  Subjective: No complaints.  Objective: Vital  Signs  Filed Vitals:   11/07/12 0300 11/07/12 0500 11/07/12 0934 11/07/12 1328  BP:   152/78 99/63  Pulse:   90 100  Temp:   97.4 F (36.3 C) 98.9 F (37.2 C)  TempSrc:   Oral Oral  Resp:    18  Height:      Weight: 91.8 kg (202 lb 6.1 oz) 91.8 kg (202 lb 6.1 oz)    SpO2:   100% 97%    Intake/Output Summary (Last 24 hours) at 11/07/12 1528 Last data filed at 11/07/12 1300  Gross per 24 hour  Intake    960 ml  Output   3975 ml  Net  -3015 ml    Filed Weights   11/06/12 0449 11/07/12 0300 11/07/12 0500  Weight: 86.2 kg (190 lb 0.6 oz) 91.8 kg (202 lb 6.1 oz) 91.8 kg (202 lb 6.1 oz)    General appearance: alert, cooperative, appropriate Resp: CTA without WRR Cardio: regular rate and rhythm, S1, S2 normal, no murmur, click, rub or gallop GI: soft, non-tender; bowel sounds normal; no masses,  no organomegaly Extremities: Wounds noted both legs. No erythema noted. Edema 2+  Lab Results:  Basic Metabolic Panel:  Recent Labs Lab 11/03/12 0419 11/04/12 0416 11/05/12 0505 11/06/12 0802 11/07/12 0752  NA 138 137 139 138 136  K 4.3 4.5 3.7 4.0 4.1  CL 101 101 101 99 98  CO2 25 26 28 31 28   GLUCOSE 150* 136* 136* 139* 214*  BUN 42* 44* 41* 41* 36*  CREATININE 1.31 1.51* 1.44* 1.47* 1.33  CALCIUM 9.4 9.7 9.5 9.4 9.5  Liver Function Tests:  Recent Labs Lab 11/01/12 0822  AST 51*  ALT 30  ALKPHOS 109  BILITOT 0.7  PROT 7.3  ALBUMIN 3.4*   CBC:  Recent Labs Lab 11/02/12 0545 11/03/12 0419 11/05/12 0505 11/06/12 0802 11/07/12 0752  WBC 6.0 5.2 6.2 5.6 5.4  HGB 11.5* 10.7* 9.7* 9.5* 11.0*  HCT 34.9* 32.7* 29.5* 27.7* 32.5*  MCV 88.6 88.1 87.3 86.3 86.7  PLT 208 183 215 184 192   Cardiac Enzymes: No results found for this basename: CKTOTAL, CKMB, CKMBINDEX, TROPONINI,  in the last 168 hours BNP (last 3 results)  Recent Labs  11/01/12 0822  PROBNP 13622.0*   CBG:  Recent Labs Lab 11/06/12 0558 11/06/12 1408 11/06/12 1635 11/06/12 2116  11/07/12 1125  GLUCAP 154* 121* 184* 166* 247*    No results found for this or any previous visit (from the past 240 hour(s)).    Studies/Results: No results found.  Medications:  Scheduled: . aspirin  81 mg Oral Daily  . atorvastatin  40 mg Oral q1800  . carvedilol  3.125 mg Oral BID WC  . fluticasone  2 spray Each Nare Daily  . furosemide  40 mg Intravenous Q8H  . guaiFENesin  1,200 mg Oral BID  . heparin  5,000 Units Subcutaneous Q8H  . insulin aspart  0-9 Units Subcutaneous TID WC  . lisinopril  5 mg Oral BID  . polyethylene glycol  17 g Oral Daily  . QUEtiapine  12.5 mg Oral QHS  . senna-docusate  2 tablet Oral BID  . sodium chloride  3 mL Intravenous Q12H  . sodium chloride  3 mL Intravenous Q12H   Continuous: . milrinone 0.25 mcg/kg/min (11/07/12 1241)   AVW:UJWJXB chloride, acetaminophen, acetaminophen, acetaminophen, albuterol, alum & mag hydroxide-simeth, bisacodyl, HYDROcodone-acetaminophen, magnesium hydroxide, morphine injection, ondansetron (ZOFRAN) IV, ondansetron (ZOFRAN) IV, ondansetron, simethicone, sodium chloride  Assessment/Plan:  Acute encephalopathy  Was thought secondary to hypoglycemia and hypoxia. But hasn't improved despite improvement in glucose levels. Wife states he has been confused for last few months. MRI brain could not be done due to dyspnea. Appreciate neurology input. Could be confused due to low cardiac output as well. LFt's are normal. Vit B12 and TSH normal. RPR is non reactive. Could have underlying dementia. Required restraints and sitter last night. Will increase seroquel, give PRN haldol and resume prn ativan. Currently calm and cooperative.  Likely sundowning.  Newly detected Dilated Cardiomyopathy with Acute Systolic CHF EF is found to be 20% on ECHO with diffuse hypokinesis. Appreciate cardiology input. Per RN, heartrate in the 130s. Will check EKG and increase coreg. Likely ischemic  Community acquired pneumonia  Chest x-ray  showed bibasilar infiltrates which was thought to be consistent with community acquired pneumonia. See above. Has completed course of antibiotics.   Hypoglycemia  Resolved. Patient was on Amaryl. Continue to hold oral hypoglycemic agents. A1C is 5.7.   Diabetes mellitus type 2  Started carbohydrate modified diet, SSI. Hemoglobin A1c is 5.7.   Hypoxia  Likely secondary to community acquired pneumonia versus CHF. This is improved. Patient now saturating well on room air.   Chronic bilateral lower extremity ulcers  Secondary to peripheral vascular disease, patient follows with outpatient wound clinic. No evidence of infection or recent change in the ulcers. Wound care consulted. Diuretics appear to be helping as well.   History of right upper extremity DVT  Patient was on Xarelto in 2013, not taking it anymore.   Normocytic Anemia Anemia panel reviewed. No deficiencies  noted.  DVT Prophylaxis:   Heparin Code Status: full code  Family Communication: none available today Disposition Plan: PT recommends SNF. Not ready for discharge.    LOS: 9 days   Christiane Ha  [facility] Pager [phone] 11/07/2012, 3:28 PM  If 7p-7AM, please contact night-coverage at [email or web address], password [facility]

## 2012-11-07 NOTE — Progress Notes (Signed)
11/07/12 Patient heart rate in the 120s 130s this afternoon, Dr Lendell Caprice on floor and made aware orders were received and EKG obtained as ordered will continue to monitor patient. Aryanah Enslow, Randall An RN

## 2012-11-07 NOTE — Progress Notes (Signed)
11/07/12 Nursing Patient out of restraints since 1030 this AM, patient is calm cooperative, following commands appropriately. Will continue to monitor patient. Amiah Frohlich, Randall An RN

## 2012-11-07 NOTE — Progress Notes (Signed)
11/07/12 Patient agreeable to dressing change of lower extremeties this AM, Aquacel and foam dressing applied as ordered patient tolerated well will continue to monitor patient. Esvin Hnat, Randall An rN

## 2012-11-07 NOTE — Progress Notes (Signed)
Physical Therapy Treatment Patient Details Name: [name] MRN: [id number] DOB: [birth year] Today's Date: 11/07/2012 Time: 4098-1191 PT Time Calculation (min): 19 min  PT Assessment / Plan / Recommendation  History of Present Illness QUOC TOME is a 72 y.o. male with past medical history of diabetes mellitus, hypertension and history of RUE DVT. Patient brought to the hospital because of confusion. He was found to be hypoglycemic with CBG of 25. Initial pulse ox in the emergency department was 75% on room air, with minimal improvement on nasal cannula. Patient was placed on NRB. CBG improved to 96. Patient evaluated by chest x-ray and showed bibasilar pneumonia.   PT Comments   Patient pleasant this afternoon and agreeable to PT. Patient continues to demonstrate difficulty with safe ambulation using assistive device (rolling walker). Patient with some instability during transfers as well. Feel patient will continue to benefit from skilled PT. Will see as indicated.  Follow Up Recommendations  SNF              Recommendations for Other Services Other (comment) (pt may benefit from psych consult)  Frequency Min 3X/week   Progress towards PT Goals Progress towards PT goals: Progressing toward goals  Plan Current plan remains appropriate    Precautions / Restrictions Precautions Precautions: Fall Restrictions Weight Bearing Restrictions: No   Pertinent Vitals/Pain Patient reports pain in calves 4/10 mainly right calf    Mobility  Bed Mobility Bed Mobility: Supine to Sit;Sitting - Scoot to Edge of Bed Supine to Sit: 4: Min assist Sitting - Scoot to [place] of Bed: 4: Min assist Details for Bed Mobility Assistance: VCs for positioning Transfers Transfers: Sit to Stand;Stand to Sit Sit to Stand: 4: Min guard Stand to Sit: 4: Min assist;With armrests;To chair/3-in-1 Details for Transfer Assistance: VCs for hand placement, still difficulty with stability with  transfers Ambulation/Gait Ambulation/Gait Assistance: 4: Min guard;4: Min assist Ambulation Distance (Feet): 280 Feet Assistive device: Rolling walker Ambulation/Gait Assistance Details: Still with poor use of assistive device, VCs for positioning, multiple rest breaks for postural correction Gait Pattern: Step-through pattern;Decreased stride length;Trunk flexed Gait velocity: decreased    Exercises General Exercises - Lower Extremity Ankle Circles/Pumps: AROM;Both;10 reps Other Exercises Other Exercises: postural extension stretching 5 second holds x 5 reps in standing Other Exercises: cervical extension 3x     PT Goals (current goals can now be found in the care plan section) Acute Rehab PT Goals Patient Stated Goal: to go home PT Goal Formulation: With patient Time For Goal Achievement: 11/14/12 Potential to Achieve Goals: Fair  Visit Information  Last PT Received On: 11/07/12 Assistance Needed: +1 History of Present Illness: [name] is a 72 y.o. male with past medical history of diabetes mellitus, hypertension and history of RUE DVT. Patient brought to the hospital because of confusion. He was found to be hypoglycemic with CBG of 25. Initial pulse ox in the emergency department was 75% on room air, with minimal improvement on nasal cannula. Patient was placed on NRB. CBG improved to 96. Patient evaluated by chest x-ray and showed bibasilar pneumonia.    Subjective Data  Subjective: I really just want a brownie Patient Stated Goal: to go home   Cognition  Cognition Arousal/Alertness: Awake/alert Behavior During Therapy: WFL for tasks assessed/performed Overall Cognitive Status: Impaired/Different from baseline Area of Impairment: Memory;Safety/judgement;Awareness;Problem solving Memory: Decreased short-term memory General Comments: Pt much improved and pleasent today.    Balance  Balance Balance Assessed: Yes Static Standing Balance Static Standing -  Balance Support: No upper extremity supported Static Standing - Level of Assistance: 5: Stand by assistance Static Standing - Comment/# of Minutes: 6 minutes during multiple rest breaks High Level Balance High Level Balance Activites: Side stepping;Backward walking;Direction changes;Turns High Level Balance Comments: no physical assist needed, cues for position within rw  End of Session PT - End of Session Equipment Utilized During Treatment: Gait belt Activity Tolerance: Patient limited by fatigue Patient left: in chair;with call bell/phone within reach;with nursing/sitter in room Nurse Communication: Mobility status   GP     Fabio Asa 11/07/2012, 3:29 PM Charlotte Crumb, PT DPT  [phone]

## 2012-11-07 NOTE — Progress Notes (Signed)
CSW following for  SNF when medically ready. CSW has completed FL2 & will continue to follow and assist with return.   Aliyanah Rozas, MSW, LCSWA [phone]   

## 2012-11-07 NOTE — Progress Notes (Signed)
Patient ID: [name], male   DOB: [birth year], 72 y.o.   MRN: [id number]    SUBJECTIVE: Patient appears to have diuresed well again tomorrow.  Last night, he became agitated/confused and had to be placed in restraints and given a sitter.  This morning, he refused his am labs.  He does not remember what happened last night and is fairly clear currently.   RHC/LHC RA mean 13  RV 63/10  PA 66/35, mean 48  PCWP mean 20  LV 129/21  AO 124/67  Oxygen saturations:  PA 55%  AO 90%  Cardiac Output (Fick) 6  Cardiac Index (Fick) 2.9  PVR 4.7 WU The RCA is occluded with collaterals and the proximal LCx has a tight stenosis.     Marland Kitchen aspirin  81 mg Oral Daily  . atorvastatin  40 mg Oral q1800  . carvedilol  3.125 mg Oral BID WC  . fluticasone  2 spray Each Nare Daily  . furosemide  40 mg Intravenous Q8H  . guaiFENesin  1,200 mg Oral BID  . heparin  5,000 Units Subcutaneous Q8H  . insulin aspart  0-9 Units Subcutaneous TID WC  . lisinopril  5 mg Oral BID  . polyethylene glycol  17 g Oral Daily  . QUEtiapine  12.5 mg Oral QHS  . senna-docusate  2 tablet Oral BID  . sodium chloride  3 mL Intravenous Q12H  . sodium chloride  3 mL Intravenous Q12H  milrinone 0.25    Filed Vitals:   11/06/12 1700 11/06/12 2017 11/07/12 0300 11/07/12 0500  BP: 99/57 121/52    Pulse: 90 86    Temp: 98.7 F (37.1 C) 98.5 F (36.9 C)    TempSrc:  Oral    Resp: 18 16    Height:      Weight:   91.8 kg (202 lb 6.1 oz) 91.8 kg (202 lb 6.1 oz)  SpO2: 98% 96%      Intake/Output Summary (Last 24 hours) at 11/07/12 0710 Last data filed at 11/07/12 4098  Gross per 24 hour  Intake      0 ml  Output   5100 ml  Net  -5100 ml    LABS: Basic Metabolic Panel:  Recent Labs  11/91/47 0505 11/06/12 0802  NA 139 138  K 3.7 4.0  CL 101 99  CO2 28 31  GLUCOSE 136* 139*  BUN 41* 41*  CREATININE 1.44* 1.47*  CALCIUM 9.5 9.4   Liver Function Tests: No results found for this basename: AST, ALT,  ALKPHOS, BILITOT, PROT, ALBUMIN,  in the last 72 hours No results found for this basename: LIPASE, AMYLASE,  in the last 72 hours CBC:  Recent Labs  11/05/12 0505 11/06/12 0802  WBC 6.2 5.6  HGB 9.7* 9.5*  HCT 29.5* 27.7*  MCV 87.3 86.3  PLT 215 184   Cardiac Enzymes: No results found for this basename: CKTOTAL, CKMB, CKMBINDEX, TROPONINI,  in the last 72 hours BNP: No components found with this basename: POCBNP,  D-Dimer: No results found for this basename: DDIMER,  in the last 72 hours Hemoglobin A1C: No results found for this basename: HGBA1C,  in the last 72 hours Fasting Lipid Panel: No results found for this basename: CHOL, HDL, LDLCALC, TRIG, CHOLHDL, LDLDIRECT,  in the last 72 hours Thyroid Function Tests: No results found for this basename: TSH, T4TOTAL, FREET3, T3FREE, THYROIDAB,  in the last 72 hours Anemia Panel: No results found for this basename: VITAMINB12, FOLATE, FERRITIN, TIBC, IRON,  RETICCTPCT,  in the last 72 hours  RADIOLOGY: Dg Chest 2 View  10/29/2012   *RADIOLOGY REPORT*  Clinical Data: Possible pneumonia  CHEST - 2 VIEW  Comparison: 10/29/2012, 10/27/2011  Findings: There is a similarly poor inspiratory effect as on the prior study.  Bilateral lower lobe opacities are stable.  Cardiac silhouette is enlarged.  Probable small bilateral pleural effusions appreciated.  IMPRESSION: Poor inspiratory effect.  Bilateral lower lobe opacities as well as probable small effusions.  Pneumonia is not excluded.   Original Report Authenticated By: Esperanza Heir, M.D.   Ct Head Wo Contrast  10/29/2012   *RADIOLOGY REPORT*  Clinical Data: Altered mental status, hypoglycemia, confusion  CT HEAD WITHOUT CONTRAST  Technique:  Contiguous axial images were obtained from the base of the skull through the vertex without contrast.  Comparison: 11/17/2011  Findings: Mild diffuse cortical volume loss noted with proportional ventricular prominence.  This finding is stable. No acute  hemorrhage, acute infarction, or mass lesion is seen.  Right maxillary sinus air fluid level is noted.  No skull fracture. Orbits are unremarkable.  No soft tissue abnormality.  IMPRESSION: No acute intracranial finding.  Right maxillary sinusitis/air fluid level.   Original Report Authenticated By: Christiana Pellant, M.D.   Dg Chest Port 1 View  10/29/2012   *RADIOLOGY REPORT*  Clinical Data: Shortness of breath  PORTABLE CHEST - 1 VIEW  Comparison: 10/27/2011  Findings: Lungs are hypoaerated.  Patchy bilateral lower lobe airspace opacities are present with trace effusions.  No pneumothorax.  No acute osseous finding.  IMPRESSION: Hypoaeration with bibasilar patchy air space opacities and trace effusions which could represent multilobar pneumonia or atelectasis given the degree of hypoaeration. If the patient's symptoms continue, consider PA and lateral chest radiographs obtained at full inspiration when the patient is clinically able.   Original Report Authenticated By: Christiana Pellant, M.D.   Dg Abd 2 Views  10/30/2012   *RADIOLOGY REPORT*  Clinical Data: Generalized abdominal pain and distention. Constipation.  ABDOMEN - 2 VIEW  Comparison: None.  Findings: Bowel gas pattern unremarkable without evidence of obstruction or significant ileus.  No evidence of free air or significant air fluid levels on the lateral decubitus image.  Large stool burden throughout the colon from cecum to rectum.  No visible opaque urinary tract calculi.  Phleboliths low in the left side of the pelvis.  Degenerative changes involving the lower thoracic and lumbar spine.  Right hip arthroplasty with anatomic alignment.  IMPRESSION: No acute abdominal abnormality.  Large stool burden throughout the colon.   Original Report Authenticated By: Hulan Saas, M.D.    PHYSICAL EXAM General: NAD Neck: JVP 10 cm, no thyromegaly or thyroid nodule.  Lungs: Clear to auscultation bilaterally with normal respiratory effort. CV:  Nondisplaced PMI.  Heart regular S1/S2, no S3/S4, no murmur.  2+ edema to knees bilaterally.  Abdomen: Soft, nontender, no hepatosplenomegaly, no distention.  Neurologic: Alert and oriented x 3. Knows he is at [facility].  Psych: Normal affect. Extremities: No clubbing or cyanosis.   TELEMETRY: Reviewed telemetry pt in NSR  ASSESSMENT AND PLAN: 1. Acute on chronic systolic CHF: EF 40-98% on echo, suspect ischemic cardiomyopathy. Has chronic LBBB. Exam difficult but I think he is still volume overloaded.  BP ok this morning.  No labs yet. - Continue milrinone gtt today along with current Lasix dose, will likely wean milrinone tomorrow and convert to po diuretics.  - Continue current Coreg and lisinopril. - He says he will let  phlebotomy draw his labs now, will followup.  2. PNA: Completed course of cefpodoxime. 3. Altered mental status: Initially in setting of hypoglycemia. Also concerned that low output heart failure was playing a role. Suspect some underlying dementia, especially with sundowning picture. 4. Renal: Awaiting lab draw this morning. 5. CAD: See cath report above.  Occluded RCA with collaterals.  90% stenosis in a relatively small LCx. LCx is interventional target potentially but significant risk of compromising OM1 which is the main vessel fed by the LCx.  I do not think this relatively small vessel will have much effect on his LV systolic function and he does not have chest pain.  Therefore, will plan medical management at this time.  He is on ASA 81 and statin.  6. Will need SNF at discharge.    Marca Ancona 11/07/2012 7:10 AM

## 2012-11-08 DIAGNOSIS — I4892 Unspecified atrial flutter: Secondary | ICD-10-CM | POA: Diagnosis not present

## 2012-11-08 LAB — CBC
HCT: 29.4 % — ABNORMAL LOW (ref 39.0–52.0)
HCT: 29.5 % — ABNORMAL LOW (ref 39.0–52.0)
Hemoglobin: 10 g/dL — ABNORMAL LOW (ref 13.0–17.0)
MCH: 29.2 pg (ref 26.0–34.0)
MCHC: 34 g/dL (ref 30.0–36.0)
MCHC: 33.9 g/dL (ref 30.0–36.0)
MCV: 85.7 fL (ref 78.0–100.0)
Platelets: 182 10*3/uL (ref 150–400)
RDW: 18.2 % — ABNORMAL HIGH (ref 11.5–15.5)
RDW: 18.3 % — ABNORMAL HIGH (ref 11.5–15.5)
WBC: 4.7 10*3/uL (ref 4.0–10.5)

## 2012-11-08 LAB — BASIC METABOLIC PANEL
BUN: 43 mg/dL — ABNORMAL HIGH (ref 6–23)
CO2: 29 mEq/L (ref 19–32)
Calcium: 9.3 mg/dL (ref 8.4–10.5)
Chloride: 101 mEq/L (ref 96–112)
Creatinine, Ser: 1.53 mg/dL — ABNORMAL HIGH (ref 0.50–1.35)
Glucose, Bld: 225 mg/dL — ABNORMAL HIGH (ref 70–99)

## 2012-11-08 LAB — GLUCOSE, CAPILLARY
Glucose-Capillary: 195 mg/dL — ABNORMAL HIGH (ref 70–99)
Glucose-Capillary: 162 mg/dL — ABNORMAL HIGH (ref 70–99)
Glucose-Capillary: 170 mg/dL — ABNORMAL HIGH (ref 70–99)

## 2012-11-08 MED ORDER — FUROSEMIDE 40 MG PO TABS
40.0000 mg | ORAL_TABLET | Freq: Every day | ORAL | Status: DC
  Administered 2012-11-08 – 2012-11-09 (×2): 40 mg via ORAL
  Filled 2012-11-08 (×2): qty 1

## 2012-11-08 MED ORDER — HEPARIN SODIUM (PORCINE) 5000 UNIT/ML IJ SOLN
5000.0000 [IU] | Freq: Three times a day (TID) | INTRAMUSCULAR | Status: DC
  Administered 2012-11-08 – 2012-11-09 (×4): 5000 [IU] via SUBCUTANEOUS
  Filled 2012-11-08 (×7): qty 1

## 2012-11-08 MED ORDER — HEPARIN (PORCINE) IN NACL 100-0.45 UNIT/ML-% IJ SOLN
1100.0000 [IU]/h | INTRAMUSCULAR | Status: DC
  Filled 2012-11-08: qty 250

## 2012-11-08 MED ORDER — HEPARIN BOLUS VIA INFUSION
3000.0000 [IU] | Freq: Once | INTRAVENOUS | Status: DC
  Filled 2012-11-08: qty 3000

## 2012-11-08 NOTE — Progress Notes (Signed)
Patient ID: [name], male   DOB: 1940-05-23, 72 y.o.   MRN: [id number]  ECG this evening showed atrial flutter with rate in the 130s.  Will stop milrinone and will initiate IV heparin gtt.  If he does not convert on his own, may need cardioversion.   Marca Ancona 11/08/2012

## 2012-11-08 NOTE — Progress Notes (Signed)
[facility] PROGRESS NOTE  [name] UYQ:[id number] DOB: 05/15/1940 DOA: 10/29/2012  PCP: Jason Courier, MD  Brief HPI: [name] is a 72 y.o. male with past medical history of diabetes mellitus, hypertension and history of RUE DVT. Patient brought to the hospital because of confusion. He was found to be hypoglycemic with CBG of 25. Initial pulse ox in the emergency department was 75% on room air, with minimal improvement on nasal cannula. Patient was placed on NRB. CBG improved to 96. Patient evaluated by chest x-ray and showed bibasilar pneumonia.His mental status did not improve much. He was seen by neurology who felt this was cognitive impairment. ECHo showed EF of 20% and he was initiated on Lasix for fluid overload. Started on Milrinone as well. Cardiology is following.  Past medical history:  Past Medical History  Diagnosis Date  . Diabetes mellitus   . Hypertension   . Hyperlipidemia   . Cervical spinal stenosis   . Lumbar spinal stenosis   . 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 DVT (deep venous thrombosis)     RUE 10/2011  . Anemia   . Cardiomyopathy     a. 10/2012 Echo: EF 20-25%, diff HK, mod dil LA/RV/RA.   Consultants: [facility], Neurology  Procedures:  2D ECHO 7/30 Study Conclusions  - Left ventricle: The cavity size was severely dilated. Wall thickness was normal. Systolic function was severely reduced. The estimated ejection fraction was in the range of 20% to 25%. Diffuse hypokinesis. - Left atrium: The atrium was moderately dilated. - Right ventricle: The cavity size was moderately dilated. - Right atrium: The atrium was moderately dilated. - Pulmonary arteries: PA peak pressure: 34mm Hg (S). - Pericardium, extracardiac: There was a left pleuraleffusion.  Antibiotics: Ceftriaxone 7/28-->8/1 Azithromycin 7/28-->8/3 Vantin 8/1-->8/5  Subjective: Not happy about going to a skilled nursing facility, but  reluctantly agrees.  Objective: Vital Signs  Filed Vitals:   11/08/12 0817 11/08/12 0917 11/08/12 1012 11/08/12 1314  BP: 89/48 97/51 105/57 103/58  Pulse: 58 61 62 75  Temp: 97.5 F (36.4 C)   98.6 F (37 C)  TempSrc: Oral   Oral  Resp: 16 18 18 18   Height:      Weight:      SpO2: 95% 93% 97% 95%    Intake/Output Summary (Last 24 hours) at 11/08/12 1414 Last data filed at 11/08/12 1215  Gross per 24 hour  Intake 2075.75 ml  Output   1575 ml  Net 500.75 ml    Filed Weights   11/07/12 0300 11/07/12 0500 11/08/12 0418  Weight: 91.8 kg (202 lb 6.1 oz) 91.8 kg (202 lb 6.1 oz) 89.4 kg (197 lb 1.5 oz)    General appearance: alert, cooperative, appropriate. Reading newspaper. Resp: CTA without WRR Cardio: regular rate and rhythm, S1, S2 normal, no murmur, click, rub or gallop GI: soft, non-tender; bowel sounds normal; no masses,  no organomegaly Extremities: Dressings clean dry and intact the No erythema noted. Edema 1+  Lab Results:  Basic Metabolic Panel:  Recent Labs Lab 11/04/12 0416 11/05/12 0505 11/06/12 0802 11/07/12 0752 11/08/12 0510  NA 137 139 138 136 138  K 4.5 3.7 4.0 4.1 4.1  CL 101 101 99 98 101  CO2 26 28 31 28 29   GLUCOSE 136* 136* 139* 214* 225*  BUN 44* 41* 41* 36* 43*  CREATININE 1.51* 1.44* 1.47* 1.33 1.53*  CALCIUM 9.7 9.5 9.4 9.5 9.3   Liver Function Tests:  No results found for this basename: AST, ALT, ALKPHOS, BILITOT, PROT, ALBUMIN,  in the last 168 hours CBC:  Recent Labs Lab 11/05/12 0505 11/06/12 0802 11/07/12 0752 11/08/12 0510 11/08/12 1104  WBC 6.2 5.6 5.4 5.6 4.7  HGB 9.7* 9.5* 11.0* 10.0* 10.0*  HCT 29.5* 27.7* 32.5* 29.4* 29.5*  MCV 87.3 86.3 86.7 85.7 86.5  PLT 215 184 192 182 184   Cardiac Enzymes: No results found for this basename: CKTOTAL, CKMB, CKMBINDEX, TROPONINI,  in the last 168 hours BNP (last 3 results)  Recent Labs  11/01/12 0822  PROBNP 13622.0*   CBG:  Recent Labs Lab 11/07/12 0733  11/07/12 1125 11/07/12 1610 11/07/12 2049 11/08/12 1102  GLUCAP 130* 247* 179* 198* 162*    No results found for this or any previous visit (from the past 240 hour(s)).    Studies/Results: No results found.  Medications:  Scheduled: . aspirin  81 mg Oral Daily  . atorvastatin  40 mg Oral q1800  . carvedilol  6.25 mg Oral BID WC  . fluticasone  2 spray Each Nare Daily  . furosemide  40 mg Oral Daily  . guaiFENesin  1,200 mg Oral BID  . heparin subcutaneous  5,000 Units Subcutaneous Q8H  . insulin aspart  0-9 Units Subcutaneous TID WC  . lisinopril  5 mg Oral BID  . polyethylene glycol  17 g Oral Daily  . QUEtiapine  25 mg Oral QHS  . senna-docusate  2 tablet Oral BID  . sodium chloride  3 mL Intravenous Q12H   Continuous:   ZOX:[id number], acetaminophen, acetaminophen, albuterol, alum & mag hydroxide-simeth, bisacodyl, haloperidol lactate, HYDROcodone-acetaminophen, LORazepam, magnesium hydroxide, morphine injection, ondansetron (ZOFRAN) IV, ondansetron (ZOFRAN) IV, ondansetron, simethicone  Assessment/Plan:  Acute encephalopathy  Currently calm and cooperative.  Likely sundowning. Continue current  Newly detected Dilated Cardiomyopathy with Acute Systolic CHF EF is found to be 20% on ECHO with diffuse hypokinesis. Off milrinone and Lasix. Had transient atrial flutter with a rate of about 1:30 last night, possibly related to milrinone. No recurrence.  Community acquired pneumonia  Treated  Hypoglycemia  Resolved. Patient was on Amaryl. Continue to hold oral hypoglycemic agents. A1C is 5.7.   Diabetes mellitus type 2  Started carbohydrate modified diet, SSI. Hemoglobin A1c is 5.7.   Hypoxia  Result  Chronic bilateral lower extremity ulcers  Secondary to peripheral vascular disease, patient follows with outpatient wound clinic. No evidence of infection or recent change in the ulcers. Wound care consulted. Diuretics appear to be helping as well.   History of  right upper extremity DVT  Patient was on Xarelto in 2013, not taking it anymore.   Normocytic Anemia Anemia panel reviewed. No deficiencies noted.  DVT Prophylaxis:   Heparin Code Status: full code  Family Communication: none available today Disposition Plan: Skilled nursing facility tomorrow if stable   LOS: 10 days   Christiane Ha  [facility] Pager [phone] 11/08/2012, 2:14 PM  If 7p-7AM, please contact night-coverage at [email or web address], password [facility]

## 2012-11-08 NOTE — Progress Notes (Signed)
Patient ID: [name], male   DOB: Aug 25, 1940, 72 y.o.   MRN: [id number]    SUBJECTIVE: Patient had episode of rapid atrial flutter yesterday evening.  Milrinone was stopped and he is now in NSR.  He is down about 25 lbs from admission and is breathing better.  IV Lasix also was stopped yesterday.  Creatinine increased mildly today.  RHC/LHC RA mean 13  RV 63/10  PA 66/35, mean 48  PCWP mean 20  LV 129/21  AO 124/67  Oxygen saturations:  PA 55%  AO 90%  Cardiac Output (Fick) 6  Cardiac Index (Fick) 2.9  PVR 4.7 WU The RCA is occluded with collaterals and the proximal LCx has a tight stenosis.   Marland Kitchen aspirin  81 mg Oral Daily  . atorvastatin  40 mg Oral q1800  . carvedilol  6.25 mg Oral BID WC  . fluticasone  2 spray Each Nare Daily  . furosemide  40 mg Oral Daily  . guaiFENesin  1,200 mg Oral BID  . heparin subcutaneous  5,000 Units Subcutaneous Q8H  . insulin aspart  0-9 Units Subcutaneous TID WC  . lisinopril  5 mg Oral BID  . polyethylene glycol  17 g Oral Daily  . QUEtiapine  25 mg Oral QHS  . senna-docusate  2 tablet Oral BID  . sodium chloride  3 mL Intravenous Q12H     Filed Vitals:   11/07/12 1656 11/07/12 2032 11/07/12 2313 11/08/12 0418  BP: 131/72 90/57 96/53  136/70  Pulse: 130 127  81  Temp:  98.6 F (37 C)  98.3 F (36.8 C)  TempSrc:  Oral  Oral  Resp:  18  18  Height:      Weight:    89.4 kg (197 lb 1.5 oz)  SpO2:  94%  94%    Intake/Output Summary (Last 24 hours) at 11/08/12 0728 Last data filed at 11/08/12 0300  Gross per 24 hour  Intake 2312.75 ml  Output   2275 ml  Net  37.75 ml    LABS: Basic Metabolic Panel:  Recent Labs  62/95/28 0752 11/08/12 0510  NA 136 138  K 4.1 4.1  CL 98 101  CO2 28 29  GLUCOSE 214* 225*  BUN 36* 43*  CREATININE 1.33 1.53*  CALCIUM 9.5 9.3   Liver Function Tests: No results found for this basename: AST, ALT, ALKPHOS, BILITOT, PROT, ALBUMIN,  in the last 72 hours No results found for this  basename: LIPASE, AMYLASE,  in the last 72 hours CBC:  Recent Labs  11/07/12 0752 11/08/12 0510  WBC 5.4 5.6  HGB 11.0* 10.0*  HCT 32.5* 29.4*  MCV 86.7 85.7  PLT 192 182   Cardiac Enzymes: No results found for this basename: CKTOTAL, CKMB, CKMBINDEX, TROPONINI,  in the last 72 hours BNP: No components found with this basename: POCBNP,  D-Dimer: No results found for this basename: DDIMER,  in the last 72 hours Hemoglobin A1C: No results found for this basename: HGBA1C,  in the last 72 hours Fasting Lipid Panel: No results found for this basename: CHOL, HDL, LDLCALC, TRIG, CHOLHDL, LDLDIRECT,  in the last 72 hours Thyroid Function Tests: No results found for this basename: TSH, T4TOTAL, FREET3, T3FREE, THYROIDAB,  in the last 72 hours Anemia Panel: No results found for this basename: VITAMINB12, FOLATE, FERRITIN, TIBC, IRON, RETICCTPCT,  in the last 72 hours  RADIOLOGY: Dg Chest 2 View  10/29/2012   *RADIOLOGY REPORT*  Clinical Data: Possible pneumonia  CHEST -  2 VIEW  Comparison: 10/29/2012, 10/27/2011  Findings: There is a similarly poor inspiratory effect as on the prior study.  Bilateral lower lobe opacities are stable.  Cardiac silhouette is enlarged.  Probable small bilateral pleural effusions appreciated.  IMPRESSION: Poor inspiratory effect.  Bilateral lower lobe opacities as well as probable small effusions.  Pneumonia is not excluded.   Original Report Authenticated By: Esperanza Heir, M.D.   Ct Head Wo Contrast  10/29/2012   *RADIOLOGY REPORT*  Clinical Data: Altered mental status, hypoglycemia, confusion  CT HEAD WITHOUT CONTRAST  Technique:  Contiguous axial images were obtained from the base of the skull through the vertex without contrast.  Comparison: 11/17/2011  Findings: Mild diffuse cortical volume loss noted with proportional ventricular prominence.  This finding is stable. No acute hemorrhage, acute infarction, or mass lesion is seen.  Right maxillary sinus air  fluid level is noted.  No skull fracture. Orbits are unremarkable.  No soft tissue abnormality.  IMPRESSION: No acute intracranial finding.  Right maxillary sinusitis/air fluid level.   Original Report Authenticated By: Christiana Pellant, M.D.   Dg Chest Port 1 View  10/29/2012   *RADIOLOGY REPORT*  Clinical Data: Shortness of breath  PORTABLE CHEST - 1 VIEW  Comparison: 10/27/2011  Findings: Lungs are hypoaerated.  Patchy bilateral lower lobe airspace opacities are present with trace effusions.  No pneumothorax.  No acute osseous finding.  IMPRESSION: Hypoaeration with bibasilar patchy air space opacities and trace effusions which could represent multilobar pneumonia or atelectasis given the degree of hypoaeration. If the patient's symptoms continue, consider PA and lateral chest radiographs obtained at full inspiration when the patient is clinically able.   Original Report Authenticated By: Christiana Pellant, M.D.   Dg Abd 2 Views  10/30/2012   *RADIOLOGY REPORT*  Clinical Data: Generalized abdominal pain and distention. Constipation.  ABDOMEN - 2 VIEW  Comparison: None.  Findings: Bowel gas pattern unremarkable without evidence of obstruction or significant ileus.  No evidence of free air or significant air fluid levels on the lateral decubitus image.  Large stool burden throughout the colon from cecum to rectum.  No visible opaque urinary tract calculi.  Phleboliths low in the left side of the pelvis.  Degenerative changes involving the lower thoracic and lumbar spine.  Right hip arthroplasty with anatomic alignment.  IMPRESSION: No acute abdominal abnormality.  Large stool burden throughout the colon.   Original Report Authenticated By: Hulan Saas, M.D.    PHYSICAL EXAM General: NAD Neck: JVP 8 cm, no thyromegaly or thyroid nodule.  Lungs: Clear to auscultation bilaterally with normal respiratory effort. CV: Nondisplaced PMI.  Heart regular S1/S2, no S3/S4, no murmur.  1+ edema 1/3 to knees  bilaterally (improved).  Abdomen: Soft, nontender, no hepatosplenomegaly, no distention.  Neurologic: Alert and oriented x 3. Knows he is at [facility].  Psych: Normal affect. Extremities: No clubbing or cyanosis.   TELEMETRY: Reviewed telemetry pt in NSR  ASSESSMENT AND PLAN: 1. Acute on chronic systolic CHF: EF 16-10% on echo, ischemic cardiomyopathy. Has chronic LBBB. Volume status is improved and weight is down 25 lbs from admission.  He is now off milrinone and Lasix.  He feels good this morning.  - Lasix 40 mg po daily. - Continue current Coreg and lisinopril.  Will watch BP today (tends to be low overnight) and may increase Coreg tomorrow.  - Can remove foley. 2. PNA: Completed course of cefpodoxime. 3. Altered mental status: Initially in setting of hypoglycemia. Also concerned that low  output heart failure was playing a role. Suspect some underlying dementia, especially with sundowning picture.  He is more clear this morning than he has been in the past.  4. Renal: BUN/creatinine mildly increased from yesterday.  As above, stopped IV lasix.  No titration of ACEI today.  5. CAD: See cath report above.  Occluded RCA with collaterals.  90% stenosis in a relatively small LCx. LCx is interventional target potentially but significant risk of compromising OM1 which is the main vessel fed by the LCx.  I do not think this relatively small vessel will have much effect on his LV systolic function and he does not have chest pain.  Therefore, will plan medical management at this time.  He is on ASA 81 and statin.  6. Atrial flutter: May have been triggered by milrinone.  It was transient and he is now off milrinone.  If it recurs off milrinone, he will need to be anticoagulated.  7. Will need SNF at discharge.  Think he probably will be ready tomorrow.    Marca Ancona 11/08/2012 7:28 AM

## 2012-11-08 NOTE — Progress Notes (Signed)
Saftey sitter D/C'd. Pt left in chair with chair alarm in use. Will continue to monitor pt closely.

## 2012-11-08 NOTE — Progress Notes (Signed)
CSW and CM spoke with patient about dc plans. Patient agreed to SNF bed at [facility] at dc. CSW left message with wife to update her with dc plan.   Maree Krabbe, MSW, Theresia Majors [phone]

## 2012-11-08 NOTE — Progress Notes (Signed)
ANTICOAGULATION CONSULT NOTE - Initial Consult  Pharmacy Consult for heparin Indication: atrial fibrillation  No Known Allergies  Patient Measurements: Height: 5\' 10"  (177.8 cm) Weight: 202 lb 6.1 oz (91.8 kg) IBW/kg (Calculated) : 73 Heparin Dosing Weight: 80 kg  Vital Signs: Temp: 98.6 F (37 C) (08/06 2032) Temp src: Oral (08/06 2032) BP: 96/53 mmHg (08/06 2313) Pulse Rate: 127 (08/06 2032)  Labs:  Recent Labs  11/05/12 0505 11/06/12 0802 11/07/12 0752  HGB 9.7* 9.5* 11.0*  HCT 29.5* 27.7* 32.5*  PLT 215 184 192  CREATININE 1.44* 1.47* 1.33    Estimated Creatinine Clearance: 58 ml/min (by C-G formula based on Cr of 1.33).   Medical History: Past Medical History  Diagnosis Date  . Diabetes mellitus   . Hypertension   . Hyperlipidemia   . Cervical spinal stenosis   . Lumbar spinal stenosis   . 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 DVT (deep venous thrombosis)     RUE 10/2011  . Anemia   . Cardiomyopathy     a. 10/2012 Echo: EF 20-25%, diff HK, mod dil LA/RV/RA.    Medications:  Prescriptions prior to admission  Medication Sig Dispense Refill  . acarbose (PRECOSE) 50 MG tablet Take 50 mg by mouth 3 (three) times daily with meals.      Marland Kitchen aspirin 81 MG chewable tablet Chew 81 mg by mouth daily.      . captopril (CAPOTEN) 25 MG tablet Take 25 mg by mouth 2 (two) times daily.      . cholecalciferol (VITAMIN D) 1000 UNITS tablet Take 2,000 Units by mouth daily.      . Cyanocobalamin (VITAMIN B-12 PO) Take 2,500 mcg by mouth daily.      . fluticasone (FLONASE) 50 MCG/ACT nasal spray Place 2 sprays into the nose daily.      . furosemide (LASIX) 20 MG tablet Take 20 mg by mouth 2 (two) times daily.      Marland Kitchen gabapentin (NEURONTIN) 300 MG capsule Take 300 mg by mouth 3 (three) times daily.      Marland Kitchen glimepiride (AMARYL) 1 MG tablet Take 1 mg by mouth 2 (two) times daily.      Marland Kitchen HYDROcodone-acetaminophen (NORCO/VICODIN) 5-325 MG per tablet Take 1  tablet by mouth every 4 (four) hours as needed. For pain      . LORazepam (ATIVAN) 0.5 MG tablet Take 0.5 mg by mouth every 6 (six) hours as needed. For anxiety      . metFORMIN (GLUCOPHAGE) 500 MG tablet Take 1,000 mg by mouth 2 (two) times daily with a meal.      . methocarbamol (ROBAXIN) 500 MG tablet Take 500 mg by mouth every 8 (eight) hours as needed. For muscle spasm      . Multiple Vitamins-Minerals (MULTIVITAMIN PO) Take 1 tablet by mouth daily.      . potassium chloride (K-DUR) 10 MEQ tablet Take 10 mEq by mouth 2 (two) times daily.      . pravastatin (PRAVACHOL) 40 MG tablet Take 40 mg by mouth at bedtime.      . pseudoephedrine-guaifenesin (MUCINEX D) 60-600 MG per tablet Take 1 tablet by mouth every 12 (twelve) hours.      . senna (SENOKOT) 8.6 MG TABS Take 1 tablet by mouth daily.        Assessment: 72 yo man to start heparin for afib.  He received heparin sq 5000 units @23 :04.  His Hg is 11.0, PTLC 192, SrCr  1.33.   Goal of Therapy:  Heparin level 0.3-0.7 units/ml Monitor platelets by anticoagulation protocol: Yes   Plan:  Heparin bolus 3,000 units and drip at 1100 units/hr Will check heparin level and CBC 8 hours after start and then daily while on heparin. Monitor for bleeding.  Brownie Nehme Poteet 11/08/2012,12:41 AM

## 2012-11-08 NOTE — Progress Notes (Signed)
MD paged and made aware pt. Is in normal sinus rhythm, heart rate in 70's, and blood pressure systolic in the 90's.  Per MD order d/c Milrinone and Heparin IV.  Pt. Resting in room with safety sitter.  Will continue to monitor.   Thane Edu, RN

## 2012-11-09 DIAGNOSIS — R0902 Hypoxemia: Secondary | ICD-10-CM

## 2012-11-09 DIAGNOSIS — I872 Venous insufficiency (chronic) (peripheral): Secondary | ICD-10-CM

## 2012-11-09 LAB — BASIC METABOLIC PANEL
BUN: 43 mg/dL — ABNORMAL HIGH (ref 6–23)
CO2: 27 mEq/L (ref 19–32)
Calcium: 9.8 mg/dL (ref 8.4–10.5)
Chloride: 100 mEq/L (ref 96–112)
Creatinine, Ser: 1.46 mg/dL — ABNORMAL HIGH (ref 0.50–1.35)
GFR calc Af Amer: 54 mL/min — ABNORMAL LOW (ref >90)
GFR calc non Af Amer: 47 mL/min — ABNORMAL LOW (ref >90)
Glucose, Bld: 200 mg/dL — ABNORMAL HIGH (ref 70–99)
Potassium: 5.1 mEq/L (ref 3.5–5.1)
Sodium: 139 mEq/L (ref 135–145)
Sodium: 138 mEq/L (ref 135–145)

## 2012-11-09 LAB — GLUCOSE, CAPILLARY

## 2012-11-09 LAB — CBC
Platelets: 192 10*3/uL (ref 150–400)
RBC: 3.83 MIL/uL — ABNORMAL LOW (ref 4.22–5.81)
RDW: 18.3 % — ABNORMAL HIGH (ref 11.5–15.5)
WBC: 5.7 10*3/uL (ref 4.0–10.5)

## 2012-11-09 MED ORDER — CARVEDILOL 6.25 MG PO TABS: 6.2500 mg | ORAL_TABLET | Freq: Two times a day (BID) | ORAL | Status: DC

## 2012-11-09 MED ORDER — QUETIAPINE FUMARATE 25 MG PO TABS: 25.0000 mg | ORAL_TABLET | Freq: Every day | ORAL | Status: DC

## 2012-11-09 MED ORDER — HYDROCODONE-ACETAMINOPHEN 5-325 MG PO TABS: 1.0000 | ORAL_TABLET | ORAL | Status: DC | PRN

## 2012-11-09 MED ORDER — LORAZEPAM 0.5 MG PO TABS: 0.5000 mg | ORAL_TABLET | Freq: Four times a day (QID) | ORAL | Status: DC | PRN

## 2012-11-09 MED ORDER — ACETAMINOPHEN 325 MG PO TABS: 650.0000 mg | ORAL_TABLET | Freq: Four times a day (QID) | ORAL | Status: DC | PRN

## 2012-11-09 MED ORDER — INSULIN ASPART 100 UNIT/ML ~~LOC~~ SOLN: 0.0000 [IU] | Freq: Three times a day (TID) | SUBCUTANEOUS | Status: DC

## 2012-11-09 MED ORDER — GABAPENTIN 300 MG PO CAPS: 300.0000 mg | ORAL_CAPSULE | Freq: Every day | ORAL | Status: DC

## 2012-11-09 MED ORDER — LISINOPRIL 5 MG PO TABS: 5.0000 mg | ORAL_TABLET | Freq: Every day | ORAL | Status: DC

## 2012-11-09 MED ORDER — MAGNESIUM HYDROXIDE 400 MG/5ML PO SUSP: 30.0000 mL | Freq: Every day | ORAL | Status: DC | PRN

## 2012-11-09 MED ORDER — DOCUSATE SODIUM 100 MG PO CAPS: 100.0000 mg | ORAL_CAPSULE | Freq: Two times a day (BID) | ORAL | Status: DC

## 2012-11-09 MED ORDER — FUROSEMIDE 20 MG PO TABS: 40.0000 mg | ORAL_TABLET | Freq: Every day | ORAL | Status: DC

## 2012-11-09 NOTE — Discharge Summary (Signed)
Physician Discharge Summary  [name] ZOX:[id number] DOB: October 22, 1940 DOA: 10/29/2012  PCP: [name], MD  Admit date: 10/29/2012 Discharge date: 11/09/2012  Time spent: 45 min  Discharge Diagnoses:     Acute encephalopathy, multifactorial, likely related to hypoglycemia, low flow from acute systolic heart failure, hypoxia probable underlying dementia, much improved.  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 male with past medical history of diabetes mellitus, hypertension and history of RUE DVT. Patient brought to the hospital because of confusion. Patient was been seen in the wound clinic this morning, he would have altered mental status and EMS was called. He was found to be hypoglycemic with CBG of 25. Patient has to use, dextrose and brought to the emergency department his CBG improved. Initial pulse ox in the emergency department was 75% on room air, with minimal improvement on nasal cannula. Patient was placed in her be the mask. Patient improved very well, and now he is on 2 L of oxygen satting 100%, CBG improved to 96. Patient evaluated by chest x-ray and showed bibasilar  pneumonia.   Hospital Course:     Acute encephalopathy, multifactorial, likely related to hypoglycemia, low flow from acute systolic heart failure, hypoxia probable underlying dementia, much improved. Neurology consulted and felt it may be related to recurrent episodes of hypoglycemia and resulting memory loss/underlying dementia. Patient's oral hypoglycemic agents were stopped and he's had no further low blood glucose on sliding scale NovoLog. Also, gabapentin has been changed to each bedtime from 300 mg 3 times a day. Currently he is appropriate, if forgetful. He was started on Seroquel nightly which has helped his sundowning. He was on lorazepam as needed prior to admission and this can be resumed.  Acute hypoxic respiratory failure secondary to acute CHF and pneumonia, resolved.    Acute systolic CHF (congestive heart failure), ejection fraction 20%, new diagnosis, ischemic cardiomyopathy  Coronary artery disease: See cath report below for details. Medical management  Transient atrial flutter, possibly triggered by milrinone    Community acquired pneumonia, treated  Chronic left bundle branch block    Falls frequently    Diabetes mellitus, type 2    HTN (hypertension) has remained stable.   History of DVT of right proximal brachial through the distal axillary vein in 2013    Hypoglycemia on admission. None further  Deconditioning: Short term SNF  Chronic kidney disease stage 3-4 has remained stable  Chronic anemia stable  Hyperlipidemia on statin  Chronic venous stasis ulcers of both legs  Procedures: Right and left heart catheterization by Dr. Shirlee Latch Left mainstem: No significant disease.  Left anterior descending (LAD): 40% mid LAD stenosis with diffuse luminal irregularities.  The LAD provides collaterals to RCA territory.  Left circumflex (LCx): Relatively small vessel. There was a 90% stenosis in the proximal LCx just prior to the take-off of a moderate OM1. There  was a moderate ramus with 50% proximal stenosis and diffuse disease in distal branches.  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  Left ventriculography: Not done (elevated creatinine).  Final Conclusions: Suspect patient has an ischemic cardiomyopathy. The RCA is occluded with collaterals and the proximal LCx has a tight stenosis. Patient's symptoms have been dyspnea related to volume overload, no chest pain. For now, will medically manage to get fluid off. Will review films with colleagues regarding intervention on the tight LCx stenosis.  Filling pressures remain elevated, R>L.   Consultations:  Cardiology  Neurology  Wound care nurse  Discharge Exam: Filed Vitals:   11/09/12 0428  BP: 137/77  Pulse: 82  Temp: 97.9 F (36.6 C)  Resp: 18   Telemetry: Normal sinus rhythm  General: Asleep. Arousable. Comfortable. Cooperative and appropriate Cardiovascular: Regular rate rhythm without murmurs gallops rubs Respiratory: Clear to auscultation bilaterally without wheeze rhonchi or rales Extremities: Trace edema. Dressings clean dry intact Psychiatric: Calm and cooperative  Discharge Instructions  Discharge Orders   Future Orders Complete By Expires     Diet - low sodium heart healthy  As directed     Diet Carb Modified  As directed     Discharge instructions  As directed     Comments:      Monitor blood glucose before meals.  Monitor basic metabolic panel within one week.  Monitor weights daily. asjust diuretics if appropriate    Walk with assistance  As directed     Walker   As directed       wound care: Aqua cell every other day to leg wounds with foam dressings.    Medication List    STOP taking these medications       acarbose 50 MG tablet  Commonly known as:  PRECOSE     captopril 25 MG tablet  Commonly known as:  CAPOTEN     glimepiride 1 MG tablet  Commonly known as:  AMARYL      metFORMIN 500 MG tablet  Commonly known as:  GLUCOPHAGE     methocarbamol 500 MG tablet  Commonly known as:  ROBAXIN     potassium chloride 10 MEQ tablet  Commonly known as:  K-DUR     pseudoephedrine-guaifenesin 60-600 MG per tablet  Commonly known as:  MUCINEX D     senna 8.6 MG Tabs tablet  Commonly known as:  SENOKOT      TAKE these medications       acetaminophen 325 MG tablet  Commonly known as:  TYLENOL  Take 2 tablets (650 mg total) by mouth every 6 (six) hours as needed.     aspirin 81 MG chewable tablet  Chew 81 mg by mouth daily.     carvedilol 6.25 MG tablet  Commonly known as:  COREG  Take 1 tablet (6.25 mg total) by mouth 2 (two) times daily with a meal.     cholecalciferol 1000 UNITS tablet  Commonly known as:  VITAMIN D  Take 2,000 Units by mouth daily.     docusate sodium 100 MG capsule  Commonly known as:  COLACE  Take 1 capsule (100 mg total) by mouth 2 (two) times daily.     fluticasone 50 MCG/ACT  nasal spray  Commonly known as:  FLONASE  Place 2 sprays into the nose daily.     furosemide 20 MG tablet  Commonly known as:  LASIX  Take 2 tablets (40 mg total) by mouth daily.     gabapentin 300 MG capsule  Commonly known as:  NEURONTIN  Take 1 capsule (300 mg total) by mouth at bedtime.     HYDROcodone-acetaminophen 5-325 MG per tablet  Commonly known as:  NORCO/VICODIN  Take 1 tablet by mouth every 4 (four) hours as needed for pain. For pain     insulin aspart 100 UNIT/ML injection  Commonly known as:  novoLOG  Inject 0-9 Units into the skin 3 (three) times daily with meals.     lisinopril 5 MG tablet  Commonly known as:  PRINIVIL,ZESTRIL  Take 1 tablet (5 mg total) by mouth daily.     LORazepam 0.5 MG tablet  Commonly known as:  ATIVAN  Take 1 tablet (0.5 mg total) by mouth every 6 (six) hours as needed for anxiety. For anxiety     magnesium hydroxide 400 MG/5ML suspension  Commonly known as:  MILK OF MAGNESIA  Take 30 mLs by mouth  daily as needed.     MULTIVITAMIN PO  Take 1 tablet by mouth daily.     pravastatin 40 MG tablet  Commonly known as:  PRAVACHOL  Take 40 mg by mouth at bedtime.     QUEtiapine 25 MG tablet  Commonly known as:  SEROQUEL  Take 1 tablet (25 mg total) by mouth at bedtime.     VITAMIN B-12 PO  Take 2,500 mcg by mouth daily.       No Known Allergies    The results of significant diagnostics from this hospitalization (including imaging, microbiology, ancillary and laboratory) are listed below for reference.    Significant Diagnostic Studies: Dg Chest 2 View  10/29/2012   *RADIOLOGY REPORT*  Clinical Data: Possible pneumonia  CHEST - 2 VIEW  Comparison: 10/29/2012, 10/27/2011  Findings: There is a similarly poor inspiratory effect as on the prior study.  Bilateral lower lobe opacities are stable.  Cardiac silhouette is enlarged.  Probable small bilateral pleural effusions appreciated.  IMPRESSION: Poor inspiratory effect.  Bilateral lower lobe opacities as well as probable small effusions.  Pneumonia is not excluded.   Original Report Authenticated By: Esperanza Heir, M.D.   Ct Head Wo Contrast  10/29/2012   *RADIOLOGY REPORT*  Clinical Data: Altered mental status, hypoglycemia, confusion  CT HEAD WITHOUT CONTRAST  Technique:  Contiguous axial images were obtained from the base of the skull through the vertex without contrast.  Comparison: 11/17/2011  Findings: Mild diffuse cortical volume loss noted with proportional ventricular prominence.  This finding is stable. No acute hemorrhage, acute infarction, or mass lesion is seen.  Right maxillary sinus air fluid level is noted.  No skull fracture. Orbits are unremarkable.  No soft tissue abnormality.  IMPRESSION: No acute intracranial finding.  Right maxillary sinusitis/air fluid level.   Original Report Authenticated By: Christiana Pellant, M.D.   Dg Chest Port 1 View  10/29/2012   *RADIOLOGY REPORT*  Clinical Data: Shortness of breath  PORTABLE  CHEST - 1 VIEW  Comparison: 10/27/2011  Findings: Lungs are hypoaerated.  Patchy bilateral lower lobe airspace opacities are present with trace effusions.  No pneumothorax.  No acute osseous finding.  IMPRESSION: Hypoaeration with bibasilar patchy air space opacities and trace effusions which could represent multilobar pneumonia or atelectasis given the degree of hypoaeration.  If the patient's symptoms continue, consider PA and lateral chest radiographs obtained at full inspiration when the patient is clinically able.   Original Report Authenticated By: Christiana Pellant, M.D.   Dg Abd 2 Views  10/30/2012   *RADIOLOGY REPORT*  Clinical Data: Generalized abdominal pain and distention. Constipation.  ABDOMEN - 2 VIEW  Comparison: None.  Findings: Bowel gas pattern unremarkable without evidence of obstruction or significant ileus.  No evidence of free air or significant air fluid levels on the lateral decubitus image.  Large stool burden throughout the colon from cecum to rectum.  No visible opaque urinary tract calculi.  Phleboliths low in the left side of the pelvis.  Degenerative changes involving the lower thoracic and lumbar spine.  Right hip arthroplasty with anatomic alignment.  IMPRESSION: No acute abdominal abnormality.  Large stool burden throughout the colon.   Original Report Authenticated By: Hulan Saas, M.D.   Echocardiogram Left ventricle: The cavity size was severely dilated. Wall thickness was normal. Systolic function was severely reduced. The estimated ejection fraction was in the range of 20% to 25%. Diffuse hypokinesis. - Left atrium: The atrium was moderately dilated. - Right ventricle: The cavity size was moderately dilated. - Right atrium: The atrium was moderately dilated. - Pulmonary arteries: PA peak pressure: 34mm Hg (S). - Pericardium, extracardiac: There was a left pleural Effusion.  EEG showed generalized slowing without any epileptiform activity  EKG showed Wide QRS  tachycardia Left axis deviation Left bundle branch block Abnormal ECG  Microbiology: No results found for this or any previous visit (from the past 240 hour(s)).   Labs: Basic Metabolic Panel:  Recent Labs Lab 11/05/12 0505 11/06/12 0802 11/07/12 0752 11/08/12 0510 11/09/12 0645  NA 139 138 136 138 139  K 3.7 4.0 4.1 4.1 4.3  CL 101 99 98 101 100  CO2 28 31 28 29 29   GLUCOSE 136* 139* 214* 225* 146*  BUN 41* 41* 36* 43* 44*  CREATININE 1.44* 1.47* 1.33 1.53* 1.46*  CALCIUM 9.5 9.4 9.5 9.3 9.8   Liver Function Tests: No results found for this basename: AST, ALT, ALKPHOS, BILITOT, PROT, ALBUMIN,  in the last 168 hours No results found for this basename: LIPASE, AMYLASE,  in the last 168 hours No results found for this basename: AMMONIA,  in the last 168 hours CBC:  Recent Labs Lab 11/06/12 0802 11/07/12 0752 11/08/12 0510 11/08/12 1104 11/09/12 0645  WBC 5.6 5.4 5.6 4.7 5.7  HGB 9.5* 11.0* 10.0* 10.0* 11.0*  HCT 27.7* 32.5* 29.4* 29.5* 34.1*  MCV 86.3 86.7 85.7 86.5 89.0  PLT 184 192 182 184 192   Cardiac Enzymes: No results found for this basename: CKTOTAL, CKMB, CKMBINDEX, TROPONINI,  in the last 168 hours BNP: BNP (last 3 results)  Recent Labs  11/01/12 0822  PROBNP 13622.0*   CBG:  Recent Labs Lab 11/08/12 0618 11/08/12 1102 11/08/12 1634 11/08/12 2057 11/09/12 0555  GLUCAP 195* 162* 170* 145* 136*   Signed:  Tory Septer L  [facility] 11/09/2012, 9:08 AM

## 2012-11-09 NOTE — Progress Notes (Signed)
Clinical Social Worker facilitated patient discharge by contacting the patient, family and facility, [facility]. Patient agreeable to this plan and arranging transport via EMS after lunch . CSW will sign off, as social work intervention is no longer needed.  Maree Krabbe, MSW, [facility] [phone]

## 2012-11-09 NOTE — Progress Notes (Signed)
Patient ID: [name], male   DOB: 11/05/1940, 72 y.o.   MRN: [id number]    SUBJECTIVE: No complaints today, going to SNF. Denies dyspnea or chest pain.   RHC/LHC RA mean 13  RV 63/10  PA 66/35, mean 48  PCWP mean 20  LV 129/21  AO 124/67  Oxygen saturations:  PA 55%  AO 90%  Cardiac Output (Fick) 6  Cardiac Index (Fick) 2.9  PVR 4.7 WU The RCA is occluded with collaterals and the proximal LCx has a tight stenosis.   Marland Kitchen aspirin  81 mg Oral Daily  . atorvastatin  40 mg Oral q1800  . carvedilol  6.25 mg Oral BID WC  . fluticasone  2 spray Each Nare Daily  . furosemide  40 mg Oral Daily  . heparin subcutaneous  5,000 Units Subcutaneous Q8H  . insulin aspart  0-9 Units Subcutaneous TID WC  . lisinopril  5 mg Oral BID  . polyethylene glycol  17 g Oral Daily  . QUEtiapine  25 mg Oral QHS  . senna-docusate  2 tablet Oral BID  . sodium chloride  3 mL Intravenous Q12H     Filed Vitals:   11/08/12 1314 11/08/12 1533 11/08/12 1959 11/09/12 0428  BP: 103/58 138/78 105/62 137/77  Pulse: 75 61 57 82  Temp: 98.6 F (37 C)  98 F (36.7 C) 97.9 F (36.6 C)  TempSrc: Oral  Oral Oral  Resp: 18  18 18   Height:      Weight:    89.1 kg (196 lb 6.9 oz)  SpO2: 95%  100% 100%    Intake/Output Summary (Last 24 hours) at 11/09/12 1311 Last data filed at 11/09/12 0831  Gross per 24 hour  Intake   1080 ml  Output    650 ml  Net    430 ml    LABS: Basic Metabolic Panel:  Recent Labs  56/21/30 0645 11/09/12 0805  NA 139 138  K 4.3 5.1  CL 100 100  CO2 29 27  GLUCOSE 146* 200*  BUN 44* 43*  CREATININE 1.46* 1.35  CALCIUM 9.8 9.4   Liver Function Tests: No results found for this basename: AST, ALT, ALKPHOS, BILITOT, PROT, ALBUMIN,  in the last 72 hours No results found for this basename: LIPASE, AMYLASE,  in the last 72 hours CBC:  Recent Labs  11/08/12 1104 11/09/12 0645  WBC 4.7 5.7  HGB 10.0* 11.0*  HCT 29.5* 34.1*  MCV 86.5 89.0  PLT 184 192    Cardiac Enzymes: No results found for this basename: CKTOTAL, CKMB, CKMBINDEX, TROPONINI,  in the last 72 hours BNP: No components found with this basename: POCBNP,  D-Dimer: No results found for this basename: DDIMER,  in the last 72 hours Hemoglobin A1C: No results found for this basename: HGBA1C,  in the last 72 hours Fasting Lipid Panel: No results found for this basename: CHOL, HDL, LDLCALC, TRIG, CHOLHDL, LDLDIRECT,  in the last 72 hours Thyroid Function Tests: No results found for this basename: TSH, T4TOTAL, FREET3, T3FREE, THYROIDAB,  in the last 72 hours Anemia Panel: No results found for this basename: VITAMINB12, FOLATE, FERRITIN, TIBC, IRON, RETICCTPCT,  in the last 72 hours  RADIOLOGY: Dg Chest 2 View  10/29/2012   *RADIOLOGY REPORT*  Clinical Data: Possible pneumonia  CHEST - 2 VIEW  Comparison: 10/29/2012, 10/27/2011  Findings: There is a similarly poor inspiratory effect as on the prior study.  Bilateral lower lobe opacities are stable.  Cardiac silhouette is  enlarged.  Probable small bilateral pleural effusions appreciated.  IMPRESSION: Poor inspiratory effect.  Bilateral lower lobe opacities as well as probable small effusions.  Pneumonia is not excluded.   Original Report Authenticated By: Esperanza Heir, M.D.   Ct Head Wo Contrast  10/29/2012   *RADIOLOGY REPORT*  Clinical Data: Altered mental status, hypoglycemia, confusion  CT HEAD WITHOUT CONTRAST  Technique:  Contiguous axial images were obtained from the base of the skull through the vertex without contrast.  Comparison: 11/17/2011  Findings: Mild diffuse cortical volume loss noted with proportional ventricular prominence.  This finding is stable. No acute hemorrhage, acute infarction, or mass lesion is seen.  Right maxillary sinus air fluid level is noted.  No skull fracture. Orbits are unremarkable.  No soft tissue abnormality.  IMPRESSION: No acute intracranial finding.  Right maxillary sinusitis/air fluid level.    Original Report Authenticated By: Christiana Pellant, M.D.   Dg Chest Port 1 View  10/29/2012   *RADIOLOGY REPORT*  Clinical Data: Shortness of breath  PORTABLE CHEST - 1 VIEW  Comparison: 10/27/2011  Findings: Lungs are hypoaerated.  Patchy bilateral lower lobe airspace opacities are present with trace effusions.  No pneumothorax.  No acute osseous finding.  IMPRESSION: Hypoaeration with bibasilar patchy air space opacities and trace effusions which could represent multilobar pneumonia or atelectasis given the degree of hypoaeration. If the patient's symptoms continue, consider PA and lateral chest radiographs obtained at full inspiration when the patient is clinically able.   Original Report Authenticated By: Christiana Pellant, M.D.   Dg Abd 2 Views  10/30/2012   *RADIOLOGY REPORT*  Clinical Data: Generalized abdominal pain and distention. Constipation.  ABDOMEN - 2 VIEW  Comparison: None.  Findings: Bowel gas pattern unremarkable without evidence of obstruction or significant ileus.  No evidence of free air or significant air fluid levels on the lateral decubitus image.  Large stool burden throughout the colon from cecum to rectum.  No visible opaque urinary tract calculi.  Phleboliths low in the left side of the pelvis.  Degenerative changes involving the lower thoracic and lumbar spine.  Right hip arthroplasty with anatomic alignment.  IMPRESSION: No acute abdominal abnormality.  Large stool burden throughout the colon.   Original Report Authenticated By: Hulan Saas, M.D.    PHYSICAL EXAM General: NAD Neck: JVP 8 cm, no thyromegaly or thyroid nodule.  Lungs: Clear to auscultation bilaterally with normal respiratory effort. CV: Nondisplaced PMI.  Heart regular S1/S2, no S3/S4, no murmur.  1+ edema 1/3 to knees bilaterally (improved).  Abdomen: Soft, nontender, no hepatosplenomegaly, no distention.  Neurologic: Alert and oriented x 3. Knows he is at [facility].  Psych: Normal  affect. Extremities: No clubbing or cyanosis.   TELEMETRY: Reviewed telemetry pt in [facility]  ASSESSMENT AND PLAN: 1. Acute on chronic systolic CHF: EF 40-98% on echo, ischemic cardiomyopathy. Has chronic LBBB. Volume status is improved and weight is down around 25 lbs from admission.  He is now off milrinone and IV Lasix.  He feels good today.  - Continue Lasix 40 mg po daily. - Continue current Coreg and lisinopril.   2. PNA: Completed course of cefpodoxime. 3. Altered mental status: Initially in setting of hypoglycemia. Also concerned that low output heart failure was playing a role. Suspect some underlying dementia, especially with sundowning picture.  He is more clear now.  4. Renal: BUN/creatinine stable.  5. CAD: See cath report above.  Occluded RCA with collaterals.  90% stenosis in a relatively small LCx. LCx  is interventional target potentially but significant risk of compromising OM1 which is the main vessel fed by the LCx.  I do not think this relatively small vessel will have much effect on his LV systolic function and he does not have chest pain.  Therefore, will plan medical management at this time.  He is on ASA 81 and statin.  6. Atrial flutter: May have been triggered by milrinone.  It was transient and he is now off milrinone.  If it recurs off milrinone, he will need to be anticoagulated.  7. Plan discharge to SNF today.  Cardiac meds: ASA 81 daily, atorvastatin 40 daily, lisinopril 5 mg bid, Coreg 6.25 mg bid, Lasix 40 mg daily.  Will need folllowup in CHF clinic and BMET in 1 week.   Marca Ancona 11/09/2012 1:11 PM

## 2012-11-09 NOTE — Progress Notes (Signed)
Clinical Social Work Department CLINICAL SOCIAL WORK PLACEMENT NOTE 11/09/2012  Patient:  [name], [name]  Account Number:  [id number] Admit date:  10/29/2012  Clinical Social Worker:  Carren Rang  Date/time:  11/01/2012 02:19 PM  Clinical Social Work is seeking post-discharge placement for this patient at the following level of care:   SKILLED NURSING   (*CSW will update this form in Epic as items are completed)   11/01/2012  Patient/family provided with [facility] Department of Clinical Social Work's list of facilities offering this level of care within the geographic area requested by the patient (or if unable, by the patient's family).  11/01/2012  Patient/family informed of their freedom to choose among providers that offer the needed level of care, that participate in Medicare, Medicaid or managed care program needed by the patient, have an available bed and are willing to accept the patient.  11/01/2012  Patient/family informed of [facility]' ownership interest in [facility], as well as of the fact that they are under no obligation to receive care at this facility.  PASARR submitted to EDS on 11/01/2012 PASARR number received from EDS on 11/01/2012  FL2 transmitted to all facilities in geographic area requested by pt/family on  11/01/2012 FL2 transmitted to all facilities within larger geographic area on   Patient informed that his/her managed care company has contracts with or will negotiate with  certain facilities, including the following:     Patient/family informed of bed offers received:  11/02/2012 Patient chooses bed at [facility] Physician recommends and patient chooses bed at    Patient to be transferred to [facility] on  11/09/2012 Patient to be transferred to facility by EMS  The following physician request were entered in Epic:   Additional Comments:  Maree Krabbe, MSW,  [employer] [phone]

## 2012-11-09 NOTE — Progress Notes (Signed)
Physical Therapy Treatment Patient Details Name: [name] MRN: [id number] DOB: 1940-11-06 Today's Date: 11/09/2012 Time: 7829-5621 PT Time Calculation (min): 25 min  PT Assessment / Plan / Recommendation  History of Present Illness [name] is a 72 y.o. male with past medical history of diabetes mellitus, hypertension and history of RUE DVT. Patient brought to the hospital because of confusion. He was found to be hypoglycemic with CBG of 25. Initial pulse ox in the emergency department was 75% on room air, with minimal improvement on nasal cannula. Patient was placed on NRB. CBG improved to 96. Patient evaluated by chest x-ray and showed bibasilar pneumonia.   PT Comments   Pt more pleasant for therapy today, patient limited in ambulation secondary to need for bathroom use, performed increased balance activities and standing activities today. Patient educated on there ex and techniques for edema control. Pt for dc to facility today, rec continued PT   Follow Up Recommendations  SNF           Equipment Recommendations  Other (comment) (TBD)    Recommendations for Other Services Other (comment) (pt may benefit from psych consult)  Frequency Min 3X/week   Progress towards PT Goals Progress towards PT goals: Progressing toward goals  Plan Current plan remains appropriate    Precautions / Restrictions Precautions Precautions: Fall Restrictions Weight Bearing Restrictions: No   Pertinent Vitals/Pain No pain reported at this time    Mobility  Bed Mobility Bed Mobility: Supine to Sit;Sitting - Scoot to Edge of Bed Supine to Sit: 4: Min assist Sitting - Scoot to [place] of Bed: 4: Min assist Details for Bed Mobility Assistance: VCs for positioning Transfers Transfers: Sit to Stand;Stand to Sit Sit to Stand: 4: Min guard;From bed;From chair/3-in-1;From toilet Stand to Sit: 4: Min guard;To chair/3-in-1;To toilet Details for Transfer Assistance: VCs for hand placement,  still difficulty with stability with transfers Ambulation/Gait Ambulation/Gait Assistance: 4: Min guard;4: Min assist Ambulation Distance (Feet): 40 Feet Assistive device: Rolling walker Ambulation/Gait Assistance Details: VCs for upright posture Gait Pattern: Step-through pattern;Decreased stride length;Trunk flexed Gait velocity: decreased General Gait Details: Patient with improvements in gait and functional mobility despite LE pain      PT Goals (current goals can now be found in the care plan section) Acute Rehab PT Goals Patient Stated Goal: to go home PT Goal Formulation: With patient Time For Goal Achievement: 11/14/12 Potential to Achieve Goals: Fair  Visit Information  Last PT Received On: 11/09/12 Assistance Needed: +1 History of Present Illness: [name] is a 72 y.o. male with past medical history of diabetes mellitus, hypertension and history of RUE DVT. Patient brought to the hospital because of confusion. He was found to be hypoglycemic with CBG of 25. Initial pulse ox in the emergency department was 75% on room air, with minimal improvement on nasal cannula. Patient was placed on NRB. CBG improved to 96. Patient evaluated by chest x-ray and showed bibasilar pneumonia.    Subjective Data  Subjective: I feel a little stronger, would love a diet dr pepper and sugar free gum Patient Stated Goal: to go home   Cognition  Cognition Arousal/Alertness: Awake/alert Behavior During Therapy: WFL for tasks assessed/performed Overall Cognitive Status: Impaired/Different from baseline Area of Impairment: Memory;Safety/judgement;Awareness;Problem solving Memory: Decreased short-term memory General Comments: Pt much improved and pleasent today.    Balance  Balance Balance Assessed: Yes Static Standing Balance Static Standing - Balance Support: No upper extremity supported Static Standing - Level of Assistance:  5: Stand by assistance Static Standing - Comment/# of  Minutes: 12 minutes, multiple counter activities  High Level Balance High Level Balance Activites: Side stepping;Backward walking;Direction changes;Turns High Level Balance Comments: no physical assist needed, cues for position within rw  End of Session PT - End of Session Equipment Utilized During Treatment: Gait belt Activity Tolerance: Treatment limited secondary to agitation Patient left: in chair;with call bell/phone within reach Nurse Communication: Mobility status   GP     Fabio Asa 11/09/2012, 1:25 PM Charlotte Crumb, PT DPT  [phone]

## 2012-11-14 ENCOUNTER — Non-Acute Institutional Stay (SKILLED_NURSING_FACILITY): Payer: Medicare Other | Admitting: Internal Medicine

## 2012-11-14 ENCOUNTER — Encounter: Payer: Self-pay | Admitting: Internal Medicine

## 2012-11-14 DIAGNOSIS — E785 Hyperlipidemia, unspecified: Secondary | ICD-10-CM

## 2012-11-14 DIAGNOSIS — I10 Essential (primary) hypertension: Secondary | ICD-10-CM

## 2012-11-14 DIAGNOSIS — E119 Type 2 diabetes mellitus without complications: Secondary | ICD-10-CM

## 2012-11-14 DIAGNOSIS — G8250 Quadriplegia, unspecified: Secondary | ICD-10-CM

## 2012-11-14 DIAGNOSIS — R296 Repeated falls: Secondary | ICD-10-CM

## 2012-11-14 DIAGNOSIS — D649 Anemia, unspecified: Secondary | ICD-10-CM

## 2012-11-14 DIAGNOSIS — G9340 Encephalopathy, unspecified: Secondary | ICD-10-CM

## 2012-11-14 DIAGNOSIS — Z9181 History of falling: Secondary | ICD-10-CM

## 2012-11-14 DIAGNOSIS — I420 Dilated cardiomyopathy: Secondary | ICD-10-CM

## 2012-11-14 DIAGNOSIS — I428 Other cardiomyopathies: Secondary | ICD-10-CM

## 2012-11-14 NOTE — Assessment & Plan Note (Signed)
Pt is working with PT on balance;has had no fall recently

## 2012-11-14 NOTE — Assessment & Plan Note (Signed)
Ranging from 9.5-11; will recheck here;this is his baseline probably

## 2012-11-14 NOTE — Assessment & Plan Note (Addendum)
Stable at this time-will continue present meds;will need to watch BP, it is a little high today; pt has a stenosis in Left circumflex artery that needs stenting and pt is here getting stronger for that procedure

## 2012-11-14 NOTE — Assessment & Plan Note (Signed)
Will monitor BS before meals and have BMP for 8/15;low sodium heart health and carb modified diet

## 2012-11-14 NOTE — Progress Notes (Signed)
MRN: [id number] Name: [name]  Sex: male Age: 72 y.o. DOB: 07-06-1940  PSC #: Sonny Dandy Facility/Room:  107 Level Of Care: SNF Provider: Merrilee Seashore D Emergency Contacts: Extended Emergency Contact Information Primary Emergency Contact: Arnette,Laura B Address: [address]          [facility] [place], [place] [address] [place] Home Phone: [phone] Relation: Spouse  Code Status: FULL  Allergies: Review of patient's allergies indicates no known allergies.  Chief Complaint  Patient presents with  . nursing home admission    HPI: Patient is 72 y.o. male who in the past year had had quadriplegia that has improved since C spine surgery and most recently was in the hospital for acute encephalopathy, multifactoral, from hypoglycemia, CHF and PNA, and a new dx of ischemic cardiomyopathy with a circumflex artery taht will require stenting.Pt is in SNF for PT/OT and to gain nutritional strength so he can get his stent and go home.   Past Medical History  Diagnosis Date  . Diabetes mellitus   . Hypertension   . Hyperlipidemia   . Cervical spinal stenosis   . Lumbar spinal stenosis   . 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 DVT (deep venous thrombosis)     RUE 10/2011  . Anemia   . Cardiomyopathy     a. 10/2012 Echo: EF 20-25%, diff HK, mod dil LA/RV/RA.    Past Surgical History  Procedure Laterality Date  . Total hip arthroplasty    . Toe amputation    . Anterior cervical decomp/discectomy fusion  10/31/2011    Procedure: ANTERIOR CERVICAL DECOMPRESSION/DISCECTOMY FUSION 2 LEVELS;  Surgeon: Cristi Loron, MD;  Location: [facility];  Service: Neurosurgery;  Laterality: N/A;  Cervical Four-Five Cervical Five-Six Anterior Cervical Decompression with fusion interbody prothesis plating and bonegraft  . Carpal tunnel release        Medication List       This list is accurate as of: 11/14/12 11:26 AM.  Always use your most recent  med list.               acetaminophen 325 MG tablet  Commonly known as:  TYLENOL  Take 2 tablets (650 mg total) by mouth every 6 (six) hours as needed.     aspirin 81 MG chewable tablet  Chew 81 mg by mouth daily.     carvedilol 6.25 MG tablet  Commonly known as:  COREG  Take 1 tablet (6.25 mg total) by mouth 2 (two) times daily with a meal.     cholecalciferol 1000 UNITS tablet  Commonly known as:  VITAMIN D  Take 2,000 Units by mouth daily.     docusate sodium 100 MG capsule  Commonly known as:  COLACE  Take 1 capsule (100 mg total) by mouth 2 (two) times daily.     fluticasone 50 MCG/ACT nasal spray  Commonly known as:  FLONASE  Place 2 sprays into the nose daily.     furosemide 20 MG tablet  Commonly known as:  LASIX  Take 2 tablets (40 mg total) by mouth daily.     gabapentin 300 MG capsule  Commonly known as:  NEURONTIN  Take 1 capsule (300 mg total) by mouth at bedtime.     HYDROcodone-acetaminophen 5-325 MG per tablet  Commonly known as:  NORCO/VICODIN  Take 1 tablet by mouth every 4 (four) hours as needed for pain. For pain     insulin aspart 100 UNIT/ML  injection  Commonly known as:  novoLOG  Inject 5 Units into the skin 3 (three) times daily with meals.     lisinopril 5 MG tablet  Commonly known as:  PRINIVIL,ZESTRIL  Take 1 tablet (5 mg total) by mouth daily.     LORazepam 0.5 MG tablet  Commonly known as:  ATIVAN  Take 1 tablet (0.5 mg total) by mouth every 6 (six) hours as needed for anxiety. For anxiety     MULTIVITAMIN PO  Take 1 tablet by mouth daily.     pravastatin 40 MG tablet  Commonly known as:  PRAVACHOL  Take 40 mg by mouth at bedtime.     QUEtiapine 25 MG tablet  Commonly known as:  SEROQUEL  Take 1 tablet (25 mg total) by mouth at bedtime.     VITAMIN B-12 PO  Take 2,500 mcg by mouth daily.        Meds ordered this encounter  Medications  . insulin aspart (NOVOLOG) 100 UNIT/ML injection    Sig: Inject 5 Units into the  skin 3 (three) times daily with meals.     There is no immunization history on file for this patient.  History  Substance Use Topics  . Smoking status: Never Smoker   . Smokeless tobacco: Never Used  . Alcohol Use: No    Family history is noncontributory    Review of Systems  DATA OBTAINED: from patient, GENERAL: Feels well no fevers, fatigue, appetite changes SKIN: No itching, rash; area on L elbow is improving EYES: No eye pain, redness, discharge EARS: No earache, tinnitus, change in hearing NOSE: No congestion, drainage or bleeding  MOUTH/THROAT: No mouth or tooth pain, No sore throat, No difficulty chewing or swallowing  RESPIRATORY: No cough, wheezing, SOB CARDIAC: No chest pain, palpitations, lower extremity edema  GI: No abdominal pain, No N/V/D or constipation, No heartburn or reflux  GU: No dysuria, frequency or urgency, or incontinence  MUSCULOSKELETAL: No unrelieved bone/joint pain NEUROLOGIC-pt has paresthesias in all fingers which is unchanged PSYCHIATRIC: No overt anxiety or sadness. Sleeps well. No behavior issue.    Filed Vitals:   11/14/12 1028  BP: 156/76  Pulse: 98  Temp: 96.4 F (35.8 C)  Resp: 20    Physical Exam  GENERAL APPEARANCE: Alert, conversant. Appropriately groomed. No acute distress.  SKIN: No diaphoresis rash; L elbow abrasion is healing HEAD: Normocephalic, atraumatic  EYES: Conjunctiva/lids clear. Pupils round, reactive. EOMs intact.  EARS: External exam WNL, canals clear. Hearing grossly normal.  NOSE: No deformity or discharge.  MOUTH/THROAT: Lips w/o lesions.  RESPIRATORY: Breathing is even, unlabored. Lung sounds are clear   CARDIOVASCULAR: distant Heart RRR no murmurs, rubs or gallops. No peripheral edema.  ARTERIAL: radial pulse 2+, DP pulse 1+  VENOUS: No varicosities. No venous stasis skin changes  GASTROINTESTINAL: Abdomen is soft, non-tender, not distended w/ normal bowel sounds. GENITOURINARY: Bladder non tender, not  distended  MUSCULOSKELETAL: No abnormal joints or musculature NEUROLOGIC: Oriented X2, Cranial nerves 2-12 grossly intact. Moves all extremities no tremor.; diffusely weak PSYCHIATRIC: Mood and affect appropriate to situation, no behavioral issues  Patient Active Problem List   Diagnosis Date Noted  . Atrial flutter 11/08/2012  . Acute systolic CHF (congestive heart failure) 11/03/2012  . Dilated cardiomyopathy 11/01/2012  . Hypoglycemia 10/29/2012  . Community acquired pneumonia 10/29/2012  . Acute encephalopathy 10/29/2012  . Cervical stenosis of spine 10/28/2011  . Spondylosis of cervical joint 10/28/2011  . Cervical myelopathy 10/28/2011  . Quadriparesis 10/28/2011  .  DVT of right proximal brachial through the distal axillary vein, acute 10/06/2011  . Constipation due to pain medication 10/06/2011  . Venous insufficiency 10/05/2011  . 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 Hyperlipidemia 10/05/2011  . Normocytic anemia 10/03/2011  . Falls frequently 10/03/2011  . Diabetes mellitus, type 2 10/03/2011  . HTN (hypertension) 10/03/2011  . Gait abnormality secondary to lumbar spinal stenosis 10/03/2011  . Arthritis 10/03/2011     CBC    Component Value Date/Time   WBC 5.7 11/09/2012 0645   RBC 3.83* 11/09/2012 0645   RBC 3.81* 10/31/2012 0440   HGB 11.0* 11/09/2012 0645   HCT 34.1* 11/09/2012 0645   PLT 192 11/09/2012 0645   MCV 89.0 11/09/2012 0645   LYMPHSABS 0.9 10/29/2012 1204   MONOABS 0.8 10/29/2012 1204   EOSABS 0.2 10/29/2012 1204   BASOSABS 0.0 10/29/2012 1204    CMP     Component Value Date/Time   NA 138 11/09/2012 0805   K 5.1 11/09/2012 0805   CL 100 11/09/2012 0805   CO2 27 11/09/2012 0805   GLUCOSE 200* 11/09/2012 0805   BUN 43* 11/09/2012 0805   CREATININE 1.35 11/09/2012 0805   CALCIUM 9.4 11/09/2012 0805   PROT 7.3 11/01/2012 0822   ALBUMIN 3.4* 11/01/2012 0822   AST 51* 11/01/2012 0822   ALT 30  11/01/2012 0822   ALKPHOS 109 11/01/2012 0822   BILITOT 0.7 11/01/2012 0822   GFRNONAA 51* 11/09/2012 0805   GFRAA 59* 11/09/2012 0805    Assessment and Plan  Dilated cardiomyopathy Stable at this time-will continue present meds;will need to watch BP, it is a little high today; pt has a stenosis in Left circumflex artery that needs stenting and pt is here getting stronger for that procedure  HTN (hypertension) Stable on current meds;a little high today, will watch   Diabetes mellitus, type 2 Will monitor BS before meals and have BMP for 8/15;low sodium heart health and carb modified diet  Acute encephalopathy Completely resolved  Quadriparesis Much improved since C spine surgery in past year;pt will be working with PT to continue to improve strength  Hyperlipidemia Continue on statin  Normocytic anemia Ranging from 9.5-11; will recheck here;this is his baseline probably  Falls frequently Pt is working with PT on balance;has had no fall recently    Margit Hanks, MD

## 2012-11-14 NOTE — Assessment & Plan Note (Signed)
Much improved since C spine surgery in past year;pt will be working with PT to continue to improve strength

## 2012-11-14 NOTE — Assessment & Plan Note (Signed)
Completely resolved. 

## 2012-11-14 NOTE — Assessment & Plan Note (Signed)
Continue on statin °

## 2012-11-14 NOTE — Assessment & Plan Note (Signed)
Stable on current meds;a little high today, will watch

## 2012-11-20 ENCOUNTER — Encounter (HOSPITAL_COMMUNITY): Payer: Self-pay

## 2012-11-20 ENCOUNTER — Ambulatory Visit (HOSPITAL_COMMUNITY)
Admit: 2012-11-20 | Discharge: 2012-11-20 | Disposition: A | Discharge status: Home / Self Care | Payer: Medicare Other | Attending: Internal Medicine | Admitting: Internal Medicine

## 2012-11-20 VITALS — BP 154/70 | HR 81 | Wt 206.8 lb

## 2012-11-20 DIAGNOSIS — I5022 Chronic systolic (congestive) heart failure: Secondary | ICD-10-CM | POA: Insufficient documentation

## 2012-11-20 DIAGNOSIS — I428 Other cardiomyopathies: Secondary | ICD-10-CM | POA: Insufficient documentation

## 2012-11-20 DIAGNOSIS — E119 Type 2 diabetes mellitus without complications: Secondary | ICD-10-CM | POA: Insufficient documentation

## 2012-11-20 DIAGNOSIS — E785 Hyperlipidemia, unspecified: Secondary | ICD-10-CM | POA: Insufficient documentation

## 2012-11-20 DIAGNOSIS — I255 Ischemic cardiomyopathy: Secondary | ICD-10-CM

## 2012-11-20 DIAGNOSIS — I4892 Unspecified atrial flutter: Secondary | ICD-10-CM | POA: Insufficient documentation

## 2012-11-20 DIAGNOSIS — Z7982 Long term (current) use of aspirin: Secondary | ICD-10-CM | POA: Insufficient documentation

## 2012-11-20 DIAGNOSIS — I2510 Atherosclerotic heart disease of native coronary artery without angina pectoris: Secondary | ICD-10-CM | POA: Insufficient documentation

## 2012-11-20 DIAGNOSIS — Z794 Long term (current) use of insulin: Secondary | ICD-10-CM | POA: Insufficient documentation

## 2012-11-20 DIAGNOSIS — I2589 Other forms of chronic ischemic heart disease: Secondary | ICD-10-CM

## 2012-11-20 DIAGNOSIS — Z79899 Other long term (current) drug therapy: Secondary | ICD-10-CM | POA: Insufficient documentation

## 2012-11-20 DIAGNOSIS — Z86718 Personal history of other venous thrombosis and embolism: Secondary | ICD-10-CM | POA: Insufficient documentation

## 2012-11-20 LAB — BASIC METABOLIC PANEL
CO2: 27 mEq/L (ref 19–32)
Chloride: 105 mEq/L (ref 96–112)
Creatinine, Ser: 1.5 mg/dL — ABNORMAL HIGH (ref 0.50–1.35)
GFR calc Af Amer: 52 mL/min — ABNORMAL LOW (ref >90)
Potassium: 4.1 mEq/L (ref 3.5–5.1)
Sodium: 141 mEq/L (ref 135–145)

## 2012-11-20 MED ORDER — FUROSEMIDE 80 MG PO TABS: 40.0000 mg | ORAL_TABLET | Freq: Every day | ORAL | Status: DC

## 2012-11-20 MED ORDER — FUROSEMIDE 80 MG PO TABS: 80.0000 mg | ORAL_TABLET | Freq: Every day | ORAL | Status: DC

## 2012-11-20 NOTE — Progress Notes (Addendum)
Patient ID: [name], male   DOB: 08-12-1940, 72 y.o.   MRN: [id number]  Weight Range 196-199 pounds  Baseline proBNP     HPI: 72 yo male with history of DM, HTN, hyperlipidemia, and DVT (RUE). He presented to the ED on 7/28 with altered mental status felt to be d/t hypoglycemia. Newly discovered cardiomyopathy EF 20%.   Admitted to [facility] with AMS and hypoglycemia. New diagnosis of systolic heart failure likely due to ICM.  Intermittent A flutter thought to be from Milrinone. Diuresed with lasix and milrinone. Discharge weight 196 pounds.   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 The RCA is occluded with collaterals and the proximal LCx has a tight stenosis.   He returns for post hospital follow up. Complains of fatigue with exertion. Denies SOB/PND. + Orthopnea. HOB elevated.  Ambulates with a walker. Weight at SNF trending up 198 to 206 pounds. Says food at SNF is not low salt. Resides at [facility] and [facility]. Plans to return home after discharge from rehab.     ROS: All systems negative except as listed in HPI, PMH and Problem List.  Past Medical History  Diagnosis Date  . Diabetes mellitus   . Hypertension   . Hyperlipidemia   . Cervical spinal stenosis   . Lumbar spinal stenosis   . 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 DVT (deep venous thrombosis)     RUE 10/2011  . Anemia   . Cardiomyopathy     a. 10/2012 Echo: EF 20-25%, diff HK, mod dil LA/RV/RA.    Current Outpatient Prescriptions  Medication Sig Dispense Refill  . acetaminophen (TYLENOL) 325 MG tablet Take 2 tablets (650 mg total) by mouth every 6 (six) hours as needed.      Marland Kitchen aspirin 81 MG chewable tablet Chew 81 mg by mouth daily.      . bisacodyl (DULCOLAX) 10 MG  suppository Place 10 mg rectally as needed for constipation. If MOM doesn't work      . carvedilol (COREG) 6.25 MG tablet Take 1 tablet (6.25 mg total) by mouth 2 (two) times daily with a meal.      . cholecalciferol (VITAMIN D) 1000 UNITS tablet Take 2,000 Units by mouth daily.      . Cyanocobalamin (VITAMIN B-12 PO) Take 2,500 mcg by mouth daily.      Marland Kitchen docusate sodium (COLACE) 100 MG capsule Take 1 capsule (100 mg total) by mouth 2 (two) times daily.  10 capsule  0  . fluticasone (FLONASE) 50 MCG/ACT nasal spray Place 2 sprays into the nose daily.      . furosemide (LASIX) 40 MG tablet Take 40 mg by mouth daily.      Marland Kitchen gabapentin (NEURONTIN) 300 MG capsule Take 1 capsule (300 mg total) by mouth at bedtime.      Marland Kitchen HYDROcodone-acetaminophen (NORCO/VICODIN) 5-325 MG per tablet Take 1 tablet by mouth every 4 (four) hours as needed for pain. For pain  20 tablet  0  . insulin aspart (NOVOLOG) 100 UNIT/ML injection Inject 5 Units into the skin 3 (three) times daily with meals. If cbg > 150      . lisinopril (PRINIVIL,ZESTRIL) 5 MG tablet Take  1 tablet (5 mg total) by mouth daily.      Marland Kitchen LORazepam (ATIVAN) 0.5 MG tablet Take 1 tablet (0.5 mg total) by mouth every 6 (six) hours as needed for anxiety. For anxiety  15 tablet  0  . magnesium hydroxide (MILK OF MAGNESIA) 400 MG/5ML suspension Take 30 mL by mouth daily as needed for constipation.      . Multiple Vitamins-Minerals (MULTIVITAMIN PO) Take 1 tablet by mouth daily.      . pravastatin (PRAVACHOL) 40 MG tablet Take 40 mg by mouth at bedtime.      Marland Kitchen QUEtiapine (SEROQUEL) 25 MG tablet Take 1 tablet (25 mg total) by mouth at bedtime.       No current facility-administered medications for this encounter.     PHYSICAL EXAM: Filed Vitals:   11/20/12 0900  BP: 154/70  Pulse: 81  Weight: 206 lb 12.8 oz (93.804 kg)  SpO2: 98%    General:  Chronically ill appearing in a wheelchair. No resp difficulty HEENT: normal Neck: supple. JVP to jaw.  Carotids 2+ bilaterally; no bruits. No lymphadenopathy or thryomegaly appreciated. Cor: PMI normal. Regular rate & rhythm. No rubs, gallops or murmurs. Lungs: clear Abdomen: soft, nontender, nondistended. No hepatosplenomegaly. No bruits or masses. Good bowel sounds. Extremities: no cyanosis, clubbing, rash, R and LLE 3+ edema Neuro: alert & orientedx3, cranial nerves grossly intact. Moves all 4 extremities w/o difficulty. Affect pleasant.   EKG: NSR 78 bpm LBBB    ASSESSMENT & PLAN:  1. Chronic Systolic Heart Failure- ICM RCA is occluded with collaterals and the proximal LCx has a tight stenosis.  ECHO 10/31/12 EF 20%. Chronic LBBB Volume status elevated. Weight up 10 pounds from discharge (discharge weight 196 pounds). Goal for weight is < 200 pounds. Double lasix to 80 mg daily. Continue carvedilol 6.25 twice a day Continue lisinopril 5 mg daily I have provided the SNF- [facility] with HF order set that included low salt diet, limiting fluid to < 2 liters per day, and daily weights.  Check BMET today.   2. CAD- RCA is occluded with collaterals and the proximal LCx has a tight stenosis. Continue aspirin 81 mg daily and atorvastatin 40 mg daily  3. A flutter Maintaining SR. If he goes back into Aflutter will need anticoagulants.    Follow up in 1 week to reassess volume status and check BMET.

## 2012-11-20 NOTE — Addendum Note (Signed)
Encounter addended by: Sherald Hess, NP on: 11/20/2012  2:35 PM<BR>     Documentation filed: Notes Section

## 2012-11-23 NOTE — Addendum Note (Signed)
Encounter addended by: Ernestina Penna on: 11/23/2012 12:32 PM<BR>     Documentation filed: Charges VN

## 2012-11-27 ENCOUNTER — Ambulatory Visit (HOSPITAL_COMMUNITY)
Admission: RE | Admit: 2012-11-27 | Discharge: 2012-11-27 | Disposition: A | Discharge status: Home / Self Care | Payer: Medicare Other | Source: Ambulatory Visit | Attending: Internal Medicine | Admitting: Internal Medicine

## 2012-11-27 VITALS — BP 122/58 | HR 74 | Wt 191.5 lb

## 2012-11-27 DIAGNOSIS — E785 Hyperlipidemia, unspecified: Secondary | ICD-10-CM | POA: Insufficient documentation

## 2012-11-27 DIAGNOSIS — I255 Ischemic cardiomyopathy: Secondary | ICD-10-CM

## 2012-11-27 DIAGNOSIS — E119 Type 2 diabetes mellitus without complications: Secondary | ICD-10-CM | POA: Insufficient documentation

## 2012-11-27 DIAGNOSIS — I428 Other cardiomyopathies: Secondary | ICD-10-CM | POA: Insufficient documentation

## 2012-11-27 DIAGNOSIS — I10 Essential (primary) hypertension: Secondary | ICD-10-CM | POA: Insufficient documentation

## 2012-11-27 DIAGNOSIS — I2589 Other forms of chronic ischemic heart disease: Secondary | ICD-10-CM

## 2012-11-27 DIAGNOSIS — I5022 Chronic systolic (congestive) heart failure: Secondary | ICD-10-CM

## 2012-11-27 LAB — BASIC METABOLIC PANEL
CO2: 29 mEq/L (ref 19–32)
Calcium: 10 mg/dL (ref 8.4–10.5)
Chloride: 100 mEq/L (ref 96–112)
Glucose, Bld: 257 mg/dL — ABNORMAL HIGH (ref 70–99)
Potassium: 3.9 mEq/L (ref 3.5–5.1)
Sodium: 141 mEq/L (ref 135–145)

## 2012-11-27 MED ORDER — FUROSEMIDE 40 MG PO TABS: 40.0000 mg | ORAL_TABLET | Freq: Every day | ORAL | Status: DC

## 2012-11-27 NOTE — Progress Notes (Signed)
Patient ID: TAYM TWIST, male   DOB: Dec 05, 1940, 72 y.o.   MRN: [id number]   Weight Range 196-199 pounds  Baseline proBNP     HPI: 72 yo female with history of DM, HTN, hyperlipidemia, and DVT (RUE). He presented to the ED on 7/28 with altered mental status felt to be d/t hypoglycemia. Newly discovered cardiomyopathy EF 20%.   Admitted to [facility] with AMS and hypoglycemia. New diagnosis of systolic heart failure likely due to ICM.  Intermittent A flutter thought to be from Milrinone. Diuresed with lasix and milrinone. Discharge weight 196 pounds.   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 The RCA is occluded with collaterals and the proximal LCx has a tight stenosis.  He returns for follow up. Last visit lasix was increased to 80 mg daily. Overall he says he is feeling better. Weight at SNF trending down from 206 to 191 pounds.  Getting low salt diet at SNF. Resides at [facility] and [facility] for ongoing rehab. Limitng fluids to < 2 liters per day.   11/20/12 Creatinine 1.5 Potassium 4.1    ROS: All systems negative except as listed in HPI, PMH and Problem List.  Past Medical History  Diagnosis Date  . Diabetes mellitus   . Hypertension   . Hyperlipidemia   . Cervical spinal stenosis   . Lumbar spinal stenosis   . 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 DVT (deep venous thrombosis)     RUE 10/2011  . Anemia   . Cardiomyopathy     a. 10/2012 Echo: EF 20-25%, diff HK, mod dil LA/RV/RA.    Current Outpatient Prescriptions  Medication Sig Dispense Refill  . acetaminophen (TYLENOL) 325 MG tablet Take 2 tablets (650 mg total) by mouth every 6 (six) hours as needed.      Marland Kitchen aspirin 81 MG chewable tablet Chew 81 mg by mouth daily.      . bisacodyl  (DULCOLAX) 10 MG suppository Place 10 mg rectally as needed for constipation. If MOM doesn't work      . carvedilol (COREG) 6.25 MG tablet Take 1 tablet (6.25 mg total) by mouth 2 (two) times daily with a meal.      . cholecalciferol (VITAMIN D) 1000 UNITS tablet Take 2,000 Units by mouth daily.      . Cyanocobalamin (VITAMIN B-12 PO) Take 2,500 mcg by mouth daily.      Marland Kitchen docusate sodium (COLACE) 100 MG capsule Take 1 capsule (100 mg total) by mouth 2 (two) times daily.  10 capsule  0  . fluticasone (FLONASE) 50 MCG/ACT nasal spray Place 2 sprays into the nose daily.      . furosemide (LASIX) 80 MG tablet Take 1 tablet (80 mg total) by mouth daily.  30 tablet  6  . gabapentin (NEURONTIN) 300 MG capsule Take 1 capsule (300 mg total) by mouth at bedtime.      Marland Kitchen HYDROcodone-acetaminophen (NORCO/VICODIN) 5-325 MG per tablet Take 1 tablet by mouth every 4 (four) hours as needed for pain. For pain  20 tablet  0  . insulin aspart (NOVOLOG) 100 UNIT/ML injection Inject 5 Units into the skin 3 (three) times daily with meals. If cbg > 150      .  lisinopril (PRINIVIL,ZESTRIL) 5 MG tablet Take 1 tablet (5 mg total) by mouth daily.      Marland Kitchen LORazepam (ATIVAN) 0.5 MG tablet Take 1 tablet (0.5 mg total) by mouth every 6 (six) hours as needed for anxiety. For anxiety  15 tablet  0  . magnesium hydroxide (MILK OF MAGNESIA) 400 MG/5ML suspension Take 30 mL by mouth daily as needed for constipation.      . Multiple Vitamins-Minerals (MULTIVITAMIN PO) Take 1 tablet by mouth daily.      . pravastatin (PRAVACHOL) 40 MG tablet Take 40 mg by mouth at bedtime.      Marland Kitchen QUEtiapine (SEROQUEL) 25 MG tablet Take 1 tablet (25 mg total) by mouth at bedtime.       No current facility-administered medications for this encounter.     PHYSICAL EXAM: Filed Vitals:   11/27/12 1450  BP: 122/58  Pulse: 74  Weight: 191 lb 8 oz (86.864 kg)  SpO2: 100%    General:  Chronically ill appearing in a wheelchair. No resp  difficulty HEENT: normal Neck: supple. JVP ~ 10 Carotids 2+ bilaterally; no bruits. No lymphadenopathy or thryomegaly appreciated. Cor: PMI normal. Regular rate & rhythm. No rubs, gallops or murmurs. Lungs: clear Abdomen: soft, nontender, nondistended. No hepatosplenomegaly. No bruits or masses. Good bowel sounds. Extremities: no cyanosis, clubbing, rash, R and LLE 2+ edema Rna LLE intact silicone dressing anterior aspect Neuro: alert & orientedx3, cranial nerves grossly intact. Moves all 4 extremities w/o difficulty. Affect pleasant.     ASSESSMENT & PLAN: 1. Chronic Systolic Heart Failure- ICM RCA is occluded with collaterals and the proximal LCx has a tight stenosis.  ECHO 10/31/12 EF 20%. Chronic LBBB Volume status elevated. Weight down 15 pounds from last week but still with volume  discharge. Goal for weight is < 190 pounds. Continue lasix 80 mg daily and add 40 mg at 2:00 pm daily. Continue carvedilol 6.25 twice a day Continue lisinopril 5 mg daily Add ted hose  Continue low salt diet, limiting fluid to < 2 liters per day, and daily weights.  Check BMET today   2. CAD- RCA is occluded with collaterals and the proximal LCx has a tight stenosis. Continue aspirin 81 mg daily and atorvastatin 40 mg daily  3. A flutter Maintaining SR. If he goes back into Aflutter will need anticoagulants.    Follow up in 2 week to reassess volume status.   Mattelyn Imhoff 3:40 PM

## 2012-11-30 ENCOUNTER — Other Ambulatory Visit: Payer: Self-pay | Admitting: *Deleted

## 2012-11-30 MED ORDER — HYDROCODONE-ACETAMINOPHEN 5-325 MG PO TABS: 1.0000 | ORAL_TABLET | ORAL | Status: DC | PRN

## 2012-12-10 ENCOUNTER — Non-Acute Institutional Stay (SKILLED_NURSING_FACILITY): Payer: Medicare Other | Admitting: Nurse Practitioner

## 2012-12-10 ENCOUNTER — Ambulatory Visit (HOSPITAL_COMMUNITY)
Admission: RE | Admit: 2012-12-10 | Discharge: 2012-12-10 | Disposition: A | Discharge status: Home / Self Care | Payer: Medicare Other | Source: Ambulatory Visit | Attending: Internal Medicine | Admitting: Internal Medicine

## 2012-12-10 VITALS — BP 110/62 | HR 66 | Wt 182.0 lb

## 2012-12-10 DIAGNOSIS — E119 Type 2 diabetes mellitus without complications: Secondary | ICD-10-CM

## 2012-12-10 DIAGNOSIS — D649 Anemia, unspecified: Secondary | ICD-10-CM

## 2012-12-10 DIAGNOSIS — I2510 Atherosclerotic heart disease of native coronary artery without angina pectoris: Secondary | ICD-10-CM | POA: Insufficient documentation

## 2012-12-10 DIAGNOSIS — I428 Other cardiomyopathies: Secondary | ICD-10-CM

## 2012-12-10 DIAGNOSIS — I420 Dilated cardiomyopathy: Secondary | ICD-10-CM

## 2012-12-10 DIAGNOSIS — I10 Essential (primary) hypertension: Secondary | ICD-10-CM

## 2012-12-10 DIAGNOSIS — G8250 Quadriplegia, unspecified: Secondary | ICD-10-CM

## 2012-12-10 DIAGNOSIS — I4892 Unspecified atrial flutter: Secondary | ICD-10-CM

## 2012-12-10 DIAGNOSIS — E785 Hyperlipidemia, unspecified: Secondary | ICD-10-CM

## 2012-12-10 DIAGNOSIS — I5022 Chronic systolic (congestive) heart failure: Secondary | ICD-10-CM

## 2012-12-10 MED ORDER — SPIRONOLACTONE 25 MG PO TABS: 12.5000 mg | ORAL_TABLET | Freq: Every day | ORAL | Status: DC

## 2012-12-10 NOTE — Progress Notes (Signed)
Patient ID: [name], male   DOB: 09/01/1940, 72 y.o.   MRN: [id number]   Weight Range 196-199 pounds  Baseline proBNP     HPI: 72 yo male with history of DM, HTN, hyperlipidemia, and DVT (RUE). He presented to the ED on 7/28 with altered mental status felt to be d/t hypoglycemia. Newly discovered cardiomyopathy EF 20%.   Admitted to [facility] with AMS and hypoglycemia. New diagnosis of systolic heart failure likely due to ICM.  Intermittent atrial flutter while in the hospital thought to be from Milrinone. Diuresed with lasix and milrinone. Discharge weight 196 pounds.   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 The RCA is occluded with collaterals and the proximal LCx has a tight stenosis.  He returns for follow up. Last visit mild volume overload noted. He continued on lasix 80 mg in am and added lasix 40 mg at 1400. Weight at SNF trending down from 191 to 180 pounds.   Mild dyspnea with exertion, walking at rehab with walker. Denies orthopnea/CP. Getting low salt diet at SNF. Resides at [facility] and [facility] for ongoing rehab but will be going home in the next couple of weeks. Limiting fluids to < 2 liters per day.    11/20/12 Creatinine 1.5 Potassium 4.1 11/27/12 Creatinine 1.39 Potassium 3.9  SH: Currently living a [facility] but is married and normally lives with wife.  Nonsmoker.   FH:  CAD  ROS: All systems negative except as listed in HPI, PMH and Problem List.  ECG: NSR, 1st degree AV block, LBBB 152 msec  Past Medical History  Diagnosis Date  . Diabetes mellitus   . Hypertension   . Hyperlipidemia   . Cervical spinal stenosis   . Lumbar spinal stenosis   . 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 DVT (deep venous thrombosis)     RUE  10/2011  . Anemia   . Cardiomyopathy     a. 10/2012 Echo: EF 20-25%, diff HK, mod dil LA/RV/RA.    Current Outpatient Prescriptions  Medication Sig Dispense Refill  . acetaminophen (TYLENOL) 325 MG tablet Take 2 tablets (650 mg total) by mouth every 6 (six) hours as needed.      Marland Kitchen aspirin 81 MG chewable tablet Chew 81 mg by mouth daily.      . bisacodyl (DULCOLAX) 10 MG suppository Place 10 mg rectally as needed for constipation. If MOM doesn't work      . carvedilol (COREG) 6.25 MG tablet Take 1 tablet (6.25 mg total) by mouth 2 (two) times daily with a meal.      . cholecalciferol (VITAMIN D) 1000 UNITS tablet Take 2,000 Units by mouth daily.      . Cyanocobalamin (VITAMIN B-12 PO) Take 2,500 mcg by mouth daily.      Marland Kitchen docusate sodium (COLACE) 100 MG capsule Take 1 capsule (100 mg total) by mouth 2 (two) times daily.  10 capsule  0  . fluticasone (FLONASE) 50 MCG/ACT nasal spray Place 2 sprays into the nose daily.      . furosemide (LASIX) 80 MG tablet Take 1 tablet (80 mg total) by mouth daily.  30 tablet  6  . gabapentin (NEURONTIN) 300 MG  capsule Take 1 capsule (300 mg total) by mouth at bedtime.      Marland Kitchen HYDROcodone-acetaminophen (NORCO/VICODIN) 5-325 MG per tablet Take 1 tablet by mouth every 4 (four) hours as needed for pain.  180 tablet  0  . insulin aspart (NOVOLOG) 100 UNIT/ML injection Inject 5 Units into the skin 3 (three) times daily with meals. If cbg > 150      . lisinopril (PRINIVIL,ZESTRIL) 5 MG tablet Take 1 tablet (5 mg total) by mouth daily.      Marland Kitchen LORazepam (ATIVAN) 0.5 MG tablet Take 1 tablet (0.5 mg total) by mouth every 6 (six) hours as needed for anxiety. For anxiety  15 tablet  0  . magnesium hydroxide (MILK OF MAGNESIA) 400 MG/5ML suspension Take 30 mL by mouth daily as needed for constipation.      . Multiple Vitamins-Minerals (MULTIVITAMIN PO) Take 1 tablet by mouth daily.      . pravastatin (PRAVACHOL) 40 MG tablet Take 40 mg by mouth at bedtime.      Marland Kitchen QUEtiapine  (SEROQUEL) 25 MG tablet Take 1 tablet (25 mg total) by mouth at bedtime.      . furosemide (LASIX) 40 MG tablet Take 1 tablet (40 mg total) by mouth daily. Daily at 2:00  30 tablet  3   No current facility-administered medications for this encounter.     PHYSICAL EXAM: Filed Vitals:   12/10/12 1119  BP: 110/62  Pulse: 66  Weight: 182 lb (82.555 kg)  SpO2: 99%   Weight 191>182 pounds General:  Chronically ill appearing in a wheelchair. No resp difficulty HEENT: normal Neck: supple. JVP ~ 7-8 Carotids 2+ bilaterally; no bruits. No lymphadenopathy or thryomegaly appreciated. Cor: PMI normal. Regular rate & rhythm. No rubs, gallops or murmurs. Lungs: clear Abdomen: soft, nontender, nondistended. No hepatosplenomegaly. No bruits or masses. Good bowel sounds. Extremities: no cyanosis, clubbing, rash, R and LLE 2+ edema RLE and  LLE intact silicone dressing anterior aspect Neuro: alert & orientedx3, cranial nerves grossly intact. Moves all 4 extremities w/o difficulty. Affect pleasant.   EKG: Sinus Brady 57 BPM QRS  ASSESSMENT & PLAN: 1. Chronic Systolic Heart Failure- ICM RCA is occluded with collaterals and the proximal LCx has a tight stenosis.  ECHO 10/31/12 EF 20%. Chronic LBBB Volume status elevated. Weight down another 9 pounds. Would like to keep weight 180-184 pounds.  Continue lasix 80 mg daily and add 40 mg at 2:00 pm daily. Continue carvedilol 6.25 twice a day Continue lisinopril 5 mg daily Add 12.5 mg spironolactone 12.5 mg daily Add ted hose Will need repeat ECHO at the end of October.   Continue low salt diet, limiting fluid to < 2 liters per day, and daily weights.  Check BMET next week  2. CAD- RCA is occluded with collaterals and the proximal LCx has a tight stenosis. Continue aspirin 81 mg daily and atorvastatin 40 mg daily. Check lipids next week  3. A flutter Maintaining SR. If he goes back into Aflutter will need anticoagulants. Check lfts next week    Follow up in 3-4 week to reassess volume status.   CLEGG,AMY 11:22 AM   Patient seen with NP, agree with the above note.   1. Chronic systolic CHF: Ischemic CMP with chronic LBBB.  Looks much better today.  Weight is down.  Walking more.  Still at rehab but going home soon.  - Continue Coreg, Lasix, and lisinopril at current doses.  - Add spironolactone 12.5 mg daily with  BMET in 1 week.  - Needs echo end of October: if EF remains low, would be CRT-D candidate.  2. CAD: LHC at last hospitalization showed occluded RCA (chronic) with 90% stenosis in small LCx.  No chest pain.  It was decided to avoid PCI to LCx as the vessel was small, he had no chest pain and PCI was unlikely to help LV function significantly, and PCI could compromise the major OM.   - Continue ASA and statin.   3. Hyperlipidemia: Check lipids with labs in 1 week.  4. Atrial flutter: Has maintained NSR off milrinone.  If atrial flutter recurs, will need anticoagulation.   Marca Ancona 12/10/2012

## 2012-12-11 NOTE — Addendum Note (Signed)
Encounter addended by: Merlene Pulling, CCT on: 12/11/2012  9:25 AM<BR>     Documentation filed: Charges VN

## 2012-12-23 ENCOUNTER — Encounter: Payer: Self-pay | Admitting: Nurse Practitioner

## 2012-12-23 NOTE — Progress Notes (Signed)
Patient ID: [name], male   DOB: 1940/10/06, 72 y.o.   MRN: [id number]  Nursing Home Location:  [facility]   Place of Service: SNF (31)  Chief Complaint  Patient presents with  . Discharge Note    HPI:  Patient is 72 y.o. male who in the past year had had quadriplegia that has improved since C spine surgery and most recently was in the hospital for acute encephalopathy, multifactoral, from hypoglycemia, CHF and PNA, and a new dx of ischemic cardiomyopathy with a circumflex artery that will require stenting.Pt is in SNF for PT/OT and to gain nutritional strength. Patient currently doing well with therapy, now stable to discharge home with home health with cardiology and PCP follow up      Review of Systems:   DATA OBTAINED: from patient, GENERAL: no fevers, fatigue, appetite changes SKIN: multiple abrasions; areas improving EYES: No eye pain, redness, discharge EARS: No earache, tinnitus, change in hearing NOSE: No congestion, drainage or bleeding   MOUTH/THROAT: No mouth or tooth pain, No sore throat, No difficulty chewing or swallowing   RESPIRATORY: No cough, wheezing, SOB CARDIAC: No chest pain, palpitations, lower extremity edema   GI: No abdominal pain, No N/V/D or constipation, No heartburn or reflux   GU: No dysuria, frequency or urgency, or incontinence   MUSCULOSKELETAL: No unrelieved bone/joint pain NEUROLOGIC pt has left sided  paresthesias in all fingers which is unchanged  PSYCHIATRIC: No overt anxiety or sadness. Sleeps well. No behavior issue.         Medications: Patient's Medications  New Prescriptions   No medications on file  Previous Medications   ACETAMINOPHEN (TYLENOL) 325 MG TABLET    Take 2 tablets (650 mg total) by mouth every 6 (six) hours as needed.   ASPIRIN 81 MG CHEWABLE TABLET    Chew 81 mg by mouth daily.   BISACODYL (DULCOLAX) 10 MG SUPPOSITORY    Place 10 mg rectally as needed for constipation. If MOM doesn't work    CARVEDILOL (COREG) 6.25 MG TABLET    Take 1 tablet (6.25 mg total) by mouth 2 (two) times daily with a meal.   CHOLECALCIFEROL (VITAMIN D) 1000 UNITS TABLET    Take 2,000 Units by mouth daily.   CYANOCOBALAMIN (VITAMIN B-12 PO)    Take 2,500 mcg by mouth daily.   DOCUSATE SODIUM (COLACE) 100 MG CAPSULE    Take 1 capsule (100 mg total) by mouth 2 (two) times daily.   FLUTICASONE (FLONASE) 50 MCG/ACT NASAL SPRAY    Place 2 sprays into the nose daily.   FUROSEMIDE (LASIX) 40 MG TABLET    Take 1 tablet (40 mg total) by mouth daily. Daily at 2:00   FUROSEMIDE (LASIX) 80 MG TABLET    Take 1 tablet (80 mg total) by mouth daily.   GABAPENTIN (NEURONTIN) 300 MG CAPSULE    Take 1 capsule (300 mg total) by mouth at bedtime.   HYDROCODONE-ACETAMINOPHEN (NORCO/VICODIN) 5-325 MG PER TABLET    Take 1 tablet by mouth every 4 (four) hours as needed for pain.   INSULIN ASPART (NOVOLOG) 100 UNIT/ML INJECTION    Inject 5 Units into the skin 3 (three) times daily with meals. If cbg > 150   LISINOPRIL (PRINIVIL,ZESTRIL) 5 MG TABLET    Take 1 tablet (5 mg total) by mouth daily.   LORAZEPAM (ATIVAN) 0.5 MG TABLET    Take 1 tablet (0.5 mg total) by mouth every 6 (six) hours as needed for  anxiety. For anxiety   MAGNESIUM HYDROXIDE (MILK OF MAGNESIA) 400 MG/5ML SUSPENSION    Take 30 mL by mouth daily as needed for constipation.   MULTIPLE VITAMINS-MINERALS (MULTIVITAMIN PO)    Take 1 tablet by mouth daily.   PRAVASTATIN (PRAVACHOL) 40 MG TABLET    Take 40 mg by mouth at bedtime.   QUETIAPINE (SEROQUEL) 25 MG TABLET    Take 1 tablet (25 mg total) by mouth at bedtime.   SPIRONOLACTONE (ALDACTONE) 25 MG TABLET    Take 0.5 tablets (12.5 mg total) by mouth daily.  Modified Medications   No medications on file  Discontinued Medications   No medications on file     Physical Exam:  Filed Vitals:   12/10/12 1312  BP: 146/79  Pulse: 86  Temp: 97.5 F (36.4 C)  Resp: 18    GENERAL APPEARANCE: Alert, conversant.  Appropriately groomed. No acute distress.   SKIN: No diaphoresis  HEAD: Normocephalic, atraumatic   EYES: Conjunctiva/lids clear. Pupils round, reactive. EOMs intact.   EARS: External exam WNL, canals clear. Hearing grossly normal.   NOSE: No deformity or discharge.   MOUTH/THROAT: Lips w/o lesions.   RESPIRATORY: Breathing is even, unlabored. Lung sounds are clear    CARDIOVASCULAR: distant heart sounds; RRR no murmurs, rubs or gallops. No peripheral edema.   ARTERIAL: radial pulse 2+, DP pulse 1+   GASTROINTESTINAL: Abdomen is soft, non-tender, not distended w/ normal bowel sounds. GENITOURINARY: Bladder non tender, not distended   MUSCULOSKELETAL: No abnormal joints or musculature NEUROLOGIC: Oriented X2, Cranial nerves 2-12 grossly intact. Moves all extremities no tremor; ongoing weakness PSYCHIATRIC: Mood and affect appropriate to situation, no behavioral issues   Labs reviewed/Significant Diagnostic Results: Sodium  140        135-145  mEq/L  SLN       Potassium  4.4        3.5-5.3  mEq/L  SLN       Chloride  107        96-112  mEq/L  SLN       CO2  26        19-32  mEq/L  SLN       Glucose  174     H  70-99  mg/dL  SLN       BUN  46     H  6-23  mg/dL  SLN       Creatinine  1.50     H  0.50-1.35  mg/dL  SLN       Bilirubin, Total  0.6        0.3-1.2  mg/dL  SLN       Alkaline Phosphatase  107        39-117  U/L  SLN       AST/SGOT  44     H  0-37  U/L  SLN       ALT/SGPT  40        0-53  U/L  SLN       Total Protein  6.8        6.0-8.3  g/dL  SLN       Albumin  3.5        3.5-5.2  g/dL  SLN       Calcium  9.1       CBC NO Diff (Complete Blood Count)       Result: 11/16/2012 9:54 AM    ( Status: F )  WBC  5.8        4.0-10.5  K/uL  SLN       RBC  3.32     L  4.22-5.81  MIL/uL  SLN       Hemoglobin  9.7     L  13.0-17.0  g/dL  SLN       Hematocrit  30.4     L  39.0-52.0  %  SLN       MCV  91.6        78.0-100.0  fL  SLN       MCH  29.2        26.0-34.0  pg  SLN        MCHC  31.9        30.0-36.0  g/dL  SLN       RDW  16.1     H  11.5-15.5  %  SLN       Platelet Count  202        150-400  K/uL  SLN      -- END OF REPORT --     Assessment/Pla  HTN (hypertension) Stable will dc on current medication and needs to follow up with cardiology and PCP  Diabetes mellitus, type 2 Pt not previously on insulin at home; requiring 5 units novolog with all meals; will start him on lantus 5 mg hs and  Dc novolog - staff to do insulin teaching. Home health nursing for further teaching  To follow up with PCP for further diabetic management.    Acute encephalopathy Resolved   Quadriparesis improved since C spine surgery in past year; will need ongoing therapies   Hyperlipidemia Cont on Statin  Normocytic anemia Remains stable  Dilated cardiomyopathy Stable; pt has a stenosis in Left circumflex artery that needs stenting. At [facility] for therapy pt is stable for discharge-will need PT/OT/Nursing per home health. No DME needed. Rx written.  will need to follow up with PCP and cardiology within 2 weeks.

## 2013-01-08 ENCOUNTER — Other Ambulatory Visit: Payer: Self-pay | Admitting: Nurse Practitioner

## 2013-01-09 ENCOUNTER — Ambulatory Visit (HOSPITAL_COMMUNITY)
Admission: RE | Admit: 2013-01-09 | Discharge: 2013-01-09 | Disposition: A | Discharge status: Home / Self Care | Payer: Medicare Other | Source: Ambulatory Visit | Attending: Cardiology | Admitting: Cardiology

## 2013-01-09 VITALS — BP 132/62 | HR 88 | Resp 20 | Wt 176.0 lb

## 2013-01-09 DIAGNOSIS — M4802 Spinal stenosis, cervical region: Secondary | ICD-10-CM | POA: Insufficient documentation

## 2013-01-09 DIAGNOSIS — I2589 Other forms of chronic ischemic heart disease: Secondary | ICD-10-CM | POA: Insufficient documentation

## 2013-01-09 DIAGNOSIS — I10 Essential (primary) hypertension: Secondary | ICD-10-CM | POA: Insufficient documentation

## 2013-01-09 DIAGNOSIS — I447 Left bundle-branch block, unspecified: Secondary | ICD-10-CM | POA: Insufficient documentation

## 2013-01-09 DIAGNOSIS — M48061 Spinal stenosis, lumbar region without neurogenic claudication: Secondary | ICD-10-CM | POA: Insufficient documentation

## 2013-01-09 DIAGNOSIS — E785 Hyperlipidemia, unspecified: Secondary | ICD-10-CM

## 2013-01-09 DIAGNOSIS — I2510 Atherosclerotic heart disease of native coronary artery without angina pectoris: Secondary | ICD-10-CM

## 2013-01-09 DIAGNOSIS — G90519 Complex regional pain syndrome I of unspecified upper limb: Secondary | ICD-10-CM | POA: Insufficient documentation

## 2013-01-09 DIAGNOSIS — Z86718 Personal history of other venous thrombosis and embolism: Secondary | ICD-10-CM | POA: Insufficient documentation

## 2013-01-09 DIAGNOSIS — I4891 Unspecified atrial fibrillation: Secondary | ICD-10-CM | POA: Insufficient documentation

## 2013-01-09 DIAGNOSIS — I5022 Chronic systolic (congestive) heart failure: Secondary | ICD-10-CM | POA: Insufficient documentation

## 2013-01-09 DIAGNOSIS — I509 Heart failure, unspecified: Secondary | ICD-10-CM

## 2013-01-09 DIAGNOSIS — Z7982 Long term (current) use of aspirin: Secondary | ICD-10-CM | POA: Insufficient documentation

## 2013-01-09 DIAGNOSIS — E119 Type 2 diabetes mellitus without complications: Secondary | ICD-10-CM | POA: Insufficient documentation

## 2013-01-09 DIAGNOSIS — I5021 Acute systolic (congestive) heart failure: Secondary | ICD-10-CM

## 2013-01-09 DIAGNOSIS — Z794 Long term (current) use of insulin: Secondary | ICD-10-CM | POA: Insufficient documentation

## 2013-01-09 LAB — BASIC METABOLIC PANEL
BUN: 87 mg/dL — ABNORMAL HIGH (ref 6–23)
Chloride: 93 mEq/L — ABNORMAL LOW (ref 96–112)
GFR calc Af Amer: 36 mL/min — ABNORMAL LOW (ref >90)
Potassium: 5.5 mEq/L — ABNORMAL HIGH (ref 3.5–5.1)
Sodium: 132 mEq/L — ABNORMAL LOW (ref 135–145)

## 2013-01-09 LAB — LIPID PANEL
Cholesterol: 121 mg/dL (ref 0–200)
HDL: 29 mg/dL — ABNORMAL LOW (ref >39)
Triglycerides: 72 mg/dL (ref <150)

## 2013-01-09 LAB — PRO B NATRIURETIC PEPTIDE: Pro B Natriuretic peptide (BNP): 1187 pg/mL — ABNORMAL HIGH (ref 0–125)

## 2013-01-09 MED ORDER — CARVEDILOL 12.5 MG PO TABS: 12.5000 mg | ORAL_TABLET | Freq: Two times a day (BID) | ORAL | Status: DC

## 2013-01-09 MED ORDER — SPIRONOLACTONE 25 MG PO TABS: 12.5000 mg | ORAL_TABLET | Freq: Every day | ORAL | Status: DC

## 2013-01-09 MED ORDER — LISINOPRIL 5 MG PO TABS: 5.0000 mg | ORAL_TABLET | Freq: Every day | ORAL | Status: DC

## 2013-01-09 MED ORDER — FUROSEMIDE 80 MG PO TABS: ORAL_TABLET | ORAL | Status: DC

## 2013-01-09 NOTE — Patient Instructions (Signed)
Increase Carvedilol to 12.5 mg Twice daily   You have been referred to Cardiac Rehab, they will contact you to schedule  Your physician has requested that you have an echocardiogram. Echocardiography is a painless test that uses sound waves to create images of your heart. It provides your doctor with information about the size and shape of your heart and how well your heart's chambers and valves are working. This procedure takes approximately one hour. There are no restrictions for this procedure.  Your physician recommends that you schedule a follow-up appointment in: 1 month

## 2013-01-09 NOTE — Progress Notes (Signed)
Patient ID: RIKI GEHRING, male   DOB: 1941-02-24, 72 y.o.   MRN: [id number] Patient ID: YOSKAR MURRILLO, male   DOB: [birth year], 72 y.o.   MRN: [id number]   Weight Range 196-199 pounds  Baseline proBNP     HPI: 72 yo male with history of DM, HTN, hyperlipidemia, and DVT (RUE). He presented to the ED on 7/28 with altered mental status felt to be d/t hypoglycemia. Newly discovered cardiomyopathy EF 20%.   Admitted to [facility] with AMS and hypoglycemia. New diagnosis of systolic heart failure likely due to ICM.  Intermittent atrial flutter while in the hospital thought to be from Milrinone. Diuresed with lasix and milrinone. Discharge weight 196 pounds.   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 The RCA is occluded with collaterals and the proximal LCx has a tight stenosis.  Patient is now home with his wife.  He is walking with a walker.  No dyspnea on flat ground.  He can climb a short flight of steps. Dyspnea with walking longer distances.  No claudication.  No chest pain.  Weight is down 6 lbs.   11/20/12 Creatinine 1.5 Potassium 4.1 11/27/12 Creatinine 1.39 Potassium 3.9  SH: Married, lives at home with his wife.  Nonsmoker.   FH:  CAD  ROS: All systems negative except as listed in HPI, PMH and Problem List.  ECG: NSR, 1st degree AV block, LBBB 152 msec  Past Medical History  Diagnosis Date  . Diabetes mellitus   . Hypertension   . Hyperlipidemia   . Cervical spinal stenosis   . Lumbar spinal stenosis   . 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 DVT (deep venous thrombosis)     RUE 10/2011  . Anemia   . Cardiomyopathy     a. 10/2012 Echo: EF 20-25%, diff HK, mod dil LA/RV/RA.    Current Outpatient Prescriptions  Medication Sig Dispense Refill  .  acetaminophen (TYLENOL) 325 MG tablet Take 2 tablets (650 mg total) by mouth every 6 (six) hours as needed.      Marland Kitchen aspirin 81 MG chewable tablet Chew 81 mg by mouth daily.      . bisacodyl (DULCOLAX) 10 MG suppository Place 10 mg rectally as needed for constipation. If MOM doesn't work      . carvedilol (COREG) 12.5 MG tablet Take 1 tablet (12.5 mg total) by mouth 2 (two) times daily with a meal.  60 tablet  6  . cholecalciferol (VITAMIN D) 1000 UNITS tablet Take 2,000 Units by mouth daily.      . Cyanocobalamin (VITAMIN B-12 PO) Take 2,500 mcg by mouth daily.      Marland Kitchen docusate sodium (COLACE) 100 MG capsule Take 1 capsule (100 mg total) by mouth 2 (two) times daily.  10 capsule  0  . fluticasone (FLONASE) 50 MCG/ACT nasal spray Place 2 sprays into the nose daily.      . furosemide (LASIX) 80 MG tablet Take 1 tablet in am and 1/2 tablet in pm.  45 tablet  6  . gabapentin (NEURONTIN) 300 MG capsule Take 1 capsule (300 mg total) by mouth at bedtime.      Marland Kitchen HYDROcodone-acetaminophen (NORCO/VICODIN) 5-325 MG per tablet Take 1 tablet  by mouth every 4 (four) hours as needed for pain.  180 tablet  0  . insulin aspart (NOVOLOG) 100 UNIT/ML injection Inject 5 Units into the skin 3 (three) times daily with meals. If cbg > 150      . lisinopril (PRINIVIL,ZESTRIL) 5 MG tablet Take 1 tablet (5 mg total) by mouth daily.  30 tablet  6  . LORazepam (ATIVAN) 0.5 MG tablet Take 1 tablet (0.5 mg total) by mouth every 6 (six) hours as needed for anxiety. For anxiety  15 tablet  0  . magnesium hydroxide (MILK OF MAGNESIA) 400 MG/5ML suspension Take 30 mL by mouth daily as needed for constipation.      . Multiple Vitamins-Minerals (MULTIVITAMIN PO) Take 1 tablet by mouth daily.      . pravastatin (PRAVACHOL) 40 MG tablet Take 40 mg by mouth at bedtime.      Marland Kitchen QUEtiapine (SEROQUEL) 25 MG tablet Take 1 tablet (25 mg total) by mouth at bedtime.      Marland Kitchen spironolactone (ALDACTONE) 25 MG tablet Take 0.5 tablets (12.5 mg total)  by mouth daily.  15 tablet  6   No current facility-administered medications for this encounter.     PHYSICAL EXAM: Filed Vitals:   01/09/13 1105  BP: 132/62  Pulse: 88  Resp: 20  Weight: 176 lb (79.833 kg)  SpO2: 100%   Weight 191>182 pounds General:  Chronically ill appearing in a wheelchair. No resp difficulty HEENT: normal Neck: supple. JVP ~ 7 cm Carotids 2+ bilaterally; no bruits. No lymphadenopathy or thryomegaly appreciated. Cor: PMI normal. Regular rate & rhythm. No rubs, gallops or murmurs. Lungs: clear Abdomen: soft, nontender, nondistended. No hepatosplenomegaly. No bruits or masses. Good bowel sounds. Extremities: no cyanosis, clubbing, rash.  No edema. Neuro: alert & orientedx3, cranial nerves grossly intact. Moves all 4 extremities w/o difficulty. Affect pleasant.   EKG: Sinus Brady 57 BPM QRS (last appt)  Assessment/Plan: 1. Chronic systolic CHF: Ischemic CMP with chronic LBBB.  Euvolemic on exam.  Weight is down.  Now at home, walking with walker.   - Continue spironolactone, Lasix, and lisinopril at current doses.  - Increase Coreg to 12.5 mg bid.  - BMET/BNP today.  - Will get echo: if EF remains low, would be CRT-D candidate.  2. CAD: LHC at last hospitalization showed occluded RCA (chronic) with 90% stenosis in small LCx.  No chest pain.  It was decided to avoid PCI to LCx as the vessel was small, he had no chest pain and PCI was unlikely to help LV function significantly, and PCI could compromise the major OM.   - Continue ASA and statin.   3. Hyperlipidemia: Check lipids today. 4. Atrial flutter: Has maintained NSR off milrinone.  If atrial flutter recurs, will need anticoagulation.   Marca Ancona 01/09/2013

## 2013-01-11 ENCOUNTER — Telehealth (HOSPITAL_COMMUNITY): Payer: Self-pay | Admitting: Anesthesiology

## 2013-01-11 MED ORDER — FUROSEMIDE 40 MG PO TABS: 40.0000 mg | ORAL_TABLET | Freq: Two times a day (BID) | ORAL | Status: DC

## 2013-01-11 NOTE — Telephone Encounter (Signed)
Call from [facility] about pts labs. Pulled up labs from yesterday and Cr elevated from 1.39>2.05 and K+ increased from 3.9>5.5.  On patients note from yesterday says he is currently taking coreg and spiro however patient does not have these medications and is not taking these. On the note says he is taking lasix 80/40 however wife states he is taking 60/40. She did report taking lisinopril 5 mg daily. Have asked her to stop the lisinopril daily and to hold diuretics for 2 days and restart 40 mg BID on Monday with repeat labs Monday. Have instructed patient to bring ALL medications to next visit.   Ulla Potash B NP-C 5:15 PM

## 2013-01-14 ENCOUNTER — Other Ambulatory Visit: Payer: Medicare Other

## 2013-01-14 ENCOUNTER — Telehealth (HOSPITAL_COMMUNITY): Payer: Self-pay | Admitting: Adult Health

## 2013-01-14 ENCOUNTER — Telehealth (HOSPITAL_COMMUNITY): Payer: Self-pay | Admitting: Cardiology

## 2013-01-14 DIAGNOSIS — I5022 Chronic systolic (congestive) heart failure: Secondary | ICD-10-CM

## 2013-01-14 NOTE — Addendum Note (Signed)
Addended by: Jazmaine Fuelling, Milagros Reap on: 01/14/2013 10:05 AM   Modules accepted: Orders

## 2013-01-14 NOTE — Telephone Encounter (Signed)
Order placed for labs.

## 2013-01-14 NOTE — Telephone Encounter (Signed)
Instructed [name] to take her husband for repeat BMET today at [facility]  Follow up in HF clinic 01/24/13 with review of all medications. She was instructed to bring all medications to appointment.   Jason Kawahara verbalized understanding.   Clora Ohmer 10:23 AM

## 2013-01-15 ENCOUNTER — Ambulatory Visit (INDEPENDENT_AMBULATORY_CARE_PROVIDER_SITE_OTHER): Payer: Medicare Other | Admitting: *Deleted

## 2013-01-15 DIAGNOSIS — I5022 Chronic systolic (congestive) heart failure: Secondary | ICD-10-CM

## 2013-01-15 DIAGNOSIS — E785 Hyperlipidemia, unspecified: Secondary | ICD-10-CM

## 2013-01-15 LAB — BASIC METABOLIC PANEL
BUN: 49 mg/dL — ABNORMAL HIGH (ref 6–23)
CO2: 30 mEq/L (ref 19–32)
Calcium: 10.3 mg/dL (ref 8.4–10.5)
Chloride: 99 mEq/L (ref 96–112)
Creatinine, Ser: 1.7 mg/dL — ABNORMAL HIGH (ref 0.4–1.5)
Glucose, Bld: 203 mg/dL — ABNORMAL HIGH (ref 70–99)

## 2013-01-16 ENCOUNTER — Telehealth (HOSPITAL_COMMUNITY): Payer: Self-pay | Admitting: Anesthesiology

## 2013-01-16 NOTE — Telephone Encounter (Signed)
Reviewed patients labs 01/15/13:  K+ 5.4, Creatinine 1.7  I had another lengthy conversation with the patients wife about the medications he is currently taking. Had her get pill bottles out again and go over each medication. He currently is not taking lisinopril and reports never getting prescription for Cleda Daub of coreg.  Meds taking  Furosemide 40 mg daily Metformin 500 mg tablets (100 mg BID or TID) she gives TID if he eats TID Glimepiride 1 mg (BID or TID) she gives TID if he eats TID Acarbose 50 mg tablet 1 tablet BID or TID depending on how many times he eats Gabapentin 300 mg TID Hydrocodone 5-325 mg i tab TID Pravastatin 40 mg QHS Lorazepam 0.5 mg TID Magnesium chelated zinc 400 mg TID (took 1 dose last night) Muccinex D 600 mg BID  Taking mortons light salt mixture and used to take multiple supplements.   Have asked the patient to stop taking metformin daily with Creatinine being 1.7. On d/c summary patient was not supposed to be on acarbose or glimepiride. Will not dicontinue at this time and have asked to please follow up with Dr. Margo Aye at [facility] clinic since blood sugars are still 200-300s. Will have them get BMET Monday and fax to [place]. Follow up appt next week and have asked her to bring all medications and supplements. Currently not weighing and will provided scale at appt. Did not know to restrict fluids to less than 2L a day reinforced. Have asked to consider having [facility] help with care.   Ulla Potash B NP-C 9:51 AM

## 2013-01-21 ENCOUNTER — Other Ambulatory Visit: Payer: Self-pay | Admitting: Nurse Practitioner

## 2013-01-24 ENCOUNTER — Ambulatory Visit (HOSPITAL_COMMUNITY)
Admission: RE | Admit: 2013-01-24 | Discharge: 2013-01-24 | Disposition: A | Discharge status: Home / Self Care | Payer: Medicare Other | Source: Ambulatory Visit | Attending: Internal Medicine | Admitting: Internal Medicine

## 2013-01-24 VITALS — BP 108/58 | HR 71 | Wt 177.2 lb

## 2013-01-24 DIAGNOSIS — I5021 Acute systolic (congestive) heart failure: Secondary | ICD-10-CM

## 2013-01-24 DIAGNOSIS — I5022 Chronic systolic (congestive) heart failure: Secondary | ICD-10-CM | POA: Insufficient documentation

## 2013-01-24 DIAGNOSIS — I4892 Unspecified atrial flutter: Secondary | ICD-10-CM | POA: Insufficient documentation

## 2013-01-24 DIAGNOSIS — N189 Chronic kidney disease, unspecified: Secondary | ICD-10-CM | POA: Insufficient documentation

## 2013-01-24 DIAGNOSIS — I2510 Atherosclerotic heart disease of native coronary artery without angina pectoris: Secondary | ICD-10-CM | POA: Insufficient documentation

## 2013-01-24 LAB — BASIC METABOLIC PANEL
BUN: 44 mg/dL — ABNORMAL HIGH (ref 6–23)
Creatinine, Ser: 1.79 mg/dL — ABNORMAL HIGH (ref 0.50–1.35)
GFR calc Af Amer: 42 mL/min — ABNORMAL LOW (ref >90)
GFR calc non Af Amer: 36 mL/min — ABNORMAL LOW (ref >90)
Glucose, Bld: 102 mg/dL — ABNORMAL HIGH (ref 70–99)

## 2013-01-24 NOTE — Progress Notes (Signed)
Patient ID: [name], male   DOB: 1940-09-23, 72 y.o.   MRN: [id number]  Weight Range 196-199 pounds  Baseline proBNP    PCP: Dr Loleta Chance  HPI: 72 yo male with history of DM, HTN, hyperlipidemia, and DVT (RUE). He presented to the ED on 10/29/2012 with altered mental status felt to be d/t hypoglycemia. Newly discovered cardiomyopathy EF 20% (01/2013)  Admitted to [facility] with AMS and hypoglycemia. New diagnosis of systolic heart failure likely due to ICM.  Intermittent atrial flutter while in the hospital thought to be from Milrinone. Diuresed with lasix and milrinone. Discharge weight 196 pounds.   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 The RCA is occluded with collaterals and the proximal LCx has a tight stenosis.  Follow up: Last visit he was instructed to take 12.5 mg carvedilol twice a day however after er spoke with his wife he was not taking spiro, coreg or lisinopril. Today he did bring all medications and he does have coreg 12.5 mg and says he is taking twice a day. Reports feeling pretty good. Denies SOB, orthopnea, CP or edema. DOE with going up hills. Drinking less than 2 liters a day and following a low salt diet. Does not have scale at home.   11/20/12 Creatinine 1.5 Potassium 4.1 11/27/12 Creatinine 1.39 Potassium 3.9 01/09/13 Creatinine 2.05, KCL 5.5, pro-BNP 1187, cholesterol 121, TG 72, HDL 29, LDL 78 01/15/13 K+ 5.4, creatinine 1.7  SH: Married, lives at home with his wife.  Nonsmoker.   FH:  CAD  ROS: All systems negative except as listed in HPI, PMH and Problem List.  Past Medical History  Diagnosis Date  . Diabetes mellitus   . Hypertension   . Hyperlipidemia   . Cervical spinal stenosis   . Lumbar spinal stenosis   . Complex regional pain syndrome of right  upper extremity   . DVT (deep venous thrombosis)     RUE 10/2011  . Anemia   . Cardiomyopathy     a. 10/2012 Echo: EF 20-25%, diff HK, mod dil LA/RV/RA.    Current Outpatient Prescriptions  Medication Sig Dispense Refill  . acarbose (PRECOSE) 50 MG tablet Take 50 mg by mouth 3 (three) times daily with meals.      Marland Kitchen aspirin 81 MG chewable tablet Chew 81 mg by mouth daily.      . bisacodyl (DULCOLAX) 10 MG suppository Place 10 mg rectally as needed for constipation. If MOM doesn't work      . calcium elemental as carbonate (BARIATRIC TUMS ULTRA) 400 MG tablet Chew 1,000 mg by mouth as needed for heartburn.      . carvedilol (COREG) 12.5 MG tablet Take 12.5 mg by mouth 2 (two) times daily with a meal.      . docusate sodium (COLACE) 100 MG capsule Take 100 mg by mouth 2 (two) times daily as needed.      . fluticasone (FLONASE) 50 MCG/ACT nasal spray Place 2 sprays into the nose daily.      . furosemide (LASIX) 40 MG tablet Take 1 tablet (40 mg total) by mouth 2 (two) times daily.  90 tablet  3  . gabapentin (NEURONTIN) 300 MG capsule Take 1 capsule (300 mg total) by  mouth at bedtime.      Marland Kitchen glimepiride (AMARYL) 1 MG tablet Take 1 mg by mouth 2 (two) times daily before a meal.      . HYDROcodone-acetaminophen (NORCO/VICODIN) 5-325 MG per tablet Take 1 tablet by mouth every 4 (four) hours as needed for pain.  180 tablet  0  . LORazepam (ATIVAN) 0.5 MG tablet Take 1 tablet (0.5 mg total) by mouth every 6 (six) hours as needed for anxiety. For anxiety  15 tablet  0  . magnesium hydroxide (MILK OF MAGNESIA) 400 MG/5ML suspension Take 30 mL by mouth daily as needed for constipation.      . metFORMIN (GLUCOPHAGE) 500 MG tablet Take 500 mg by mouth 2 (two) times daily with a meal.      . pravastatin (PRAVACHOL) 40 MG tablet Take 40 mg by mouth at bedtime.       No current facility-administered medications for this encounter.   Filed Vitals:   01/24/13 1532  BP: 108/58  Pulse: 71  Weight: 177 lb 4  oz (80.4 kg)  SpO2: 98%   PHYSICAL EXAM: General: Elderly. No resp difficulty, uses walker and cane HEENT: normal Neck: supple. JVP 7 Carotids 2+ bilaterally; no bruits. No lymphadenopathy or thryomegaly appreciated. Cor: PMI normal. Regular rate & rhythm. No rubs, gallops or murmurs. Lungs: clear Abdomen: soft, nontender, nondistended. No hepatosplenomegaly. No bruits or masses. Good bowel sounds. Extremities: no cyanosis, clubbing, rash.  No edema. Neuro: alert & orientedx3, cranial nerves grossly intact. Moves all 4 extremities w/o difficulty. Affect pleasant.   Assessment/Plan:  1. Chronic systolic CHF: Ischemic CMP with chronic LBBB, EF 20% (10/2012) - NYHA III symptoms. Volume status stable will continue lasix 40 mg BID. Cr is elevated however think that 1.7 is now new baseline. - He brought all medications from home and he has not been taking lisinopril or spiro.  - Will continue carvedilol at 12.5 mg BID. Not on anything for afterload reduction would like to add hydralazine and nitrates next visit. - Provided patient with scale and had lengthy conversation about taking medications daily, weighing daily, restricting fluids to less than 2L and eating no more than 2gm of Na a day. -Recommended HHRN and PT but he is not interested. - Will bring back in a month with ECHO. If EF remains less than 35% consider referral to EP for CRT-D. 2. CAD: LHC at last 12/2012 showed occluded RCA (chronic) with 90% stenosis in small LCx.  No chest pain.  It was decided to avoid PCI to LCx as the vessel was small, he had no chest pain and PCI was unlikely to help LV function significantly, and PCI could compromise the major OM.   - Continue ASA and statin.   3. Atrial flutter:  Has maintained NSR off milrinone.  If atrial flutter recurs, will need anticoagulation.  4. Chronic kidney disease, Stage 3-4: renal function near baseline  Follow up 1 month with ECHO  Ulla Potash B  NP-C 01/24/2013   Patient seen and examined with Ulla Potash, NP. We discussed all aspects of the encounter. I agree with the assessment and plan as stated above. HF much improved. Volume status looks good. Renal function near baseline. Would avoid ACE/ARB due to recent renal failure. BP soft so will not add hydral/nitrates at this visit but will consider at next visit. Extensive amount of time spent educating about meds and dietary measures. Scale provided. CAD stable without evidence of ischemia. Would prefer anti-coagulation for AFL but  compliance is a problem. Refuses HHRN and HHPT.  Truman Hayward 5:39 PM

## 2013-01-24 NOTE — Patient Instructions (Signed)
Will continue medications as prescribed.  Do not take lisinopril or spironolactone.  Call any issues.  Start weighing daily.  Do the following things EVERYDAY: 1) Weigh yourself in the morning before breakfast. Write it down and keep it in a log. 2) Take your medicines as prescribed 3) Eat low salt foods-Limit salt (sodium) to 2000 mg per day.  4) Stay as active as you can everyday 5) Limit all fluids for the day to less than 2 liters

## 2013-01-28 ENCOUNTER — Ambulatory Visit (HOSPITAL_COMMUNITY): Payer: Medicare Other

## 2013-01-31 ENCOUNTER — Other Ambulatory Visit: Payer: Self-pay | Admitting: *Deleted

## 2013-01-31 ENCOUNTER — Other Ambulatory Visit: Payer: Self-pay | Admitting: Nurse Practitioner

## 2013-01-31 MED ORDER — PRAVASTATIN SODIUM 40 MG PO TABS: 40.0000 mg | ORAL_TABLET | Freq: Every day | ORAL | Status: DC

## 2013-01-31 NOTE — Telephone Encounter (Signed)
Pravastatin was refilled and e-faxed.

## 2013-02-11 ENCOUNTER — Encounter (HOSPITAL_COMMUNITY): Payer: Medicare Other

## 2013-02-18 ENCOUNTER — Telehealth (HOSPITAL_COMMUNITY): Payer: Self-pay | Admitting: Cardiac Rehabilitation

## 2013-02-18 NOTE — Telephone Encounter (Signed)
pc to pt to discuss cardiac rehab. Spoke to pt wife.  Unfortunately, pt has high co pay $45/visit and unable to afford.  Will reassess copayment in January 2015.  Pt wife has multiple questions about lasix dosage and home scales.  Pt wife instructed in importance of daily weights and to call CHF clinic for weight gain >3-5 lb in one day or one week.  Also instructed to clarify lasix dosage with CHF clinic as medication list reflects different from how pt wife is administering. Pt has scheduled appt 02/26/13 with CHF clinic.  Pt wife verbalized understanding

## 2013-02-20 ENCOUNTER — Telehealth (HOSPITAL_COMMUNITY): Payer: Self-pay | Admitting: Cardiac Rehabilitation

## 2013-02-20 NOTE — Telephone Encounter (Signed)
pc to pt wife to confirm she clarified her medication questions with CHF clinic as instructed. Pt wife states she has not called the clinic as she has not felt well.  She states she gave pt lasix 60mg  this am due to leg swelling however normally she gives pt lasix 40mg  once daily.  Pt is is not using home scales, denies dyspnea.  I called Heather, RN at CHF clinic to verify proper lasix dosage. Herbert Seta will contact pt with instructions.

## 2013-02-22 ENCOUNTER — Other Ambulatory Visit (HOSPITAL_COMMUNITY): Payer: Self-pay | Admitting: Cardiology

## 2013-02-22 NOTE — Telephone Encounter (Signed)
Opened in error

## 2013-02-26 ENCOUNTER — Ambulatory Visit (HOSPITAL_COMMUNITY): Payer: Medicare Other | Attending: Family Medicine

## 2013-02-26 ENCOUNTER — Inpatient Hospital Stay (HOSPITAL_COMMUNITY): Admission: RE | Admit: 2013-02-26 | Payer: Medicare Other | Source: Ambulatory Visit

## 2013-03-19 ENCOUNTER — Ambulatory Visit (HOSPITAL_COMMUNITY)
Admission: RE | Admit: 2013-03-19 | Discharge: 2013-03-19 | Disposition: A | Discharge status: Home / Self Care | Payer: Medicare Other | Source: Ambulatory Visit | Attending: Cardiology | Admitting: Cardiology

## 2013-03-19 ENCOUNTER — Encounter (HOSPITAL_COMMUNITY): Payer: Self-pay

## 2013-03-19 ENCOUNTER — Ambulatory Visit (HOSPITAL_BASED_OUTPATIENT_CLINIC_OR_DEPARTMENT_OTHER)
Admission: RE | Admit: 2013-03-19 | Discharge: 2013-03-19 | Disposition: A | Discharge status: Home / Self Care | Payer: Medicare Other | Source: Ambulatory Visit | Attending: Internal Medicine | Admitting: Internal Medicine

## 2013-03-19 VITALS — BP 118/60 | HR 84 | Wt 186.2 lb

## 2013-03-19 DIAGNOSIS — M4802 Spinal stenosis, cervical region: Secondary | ICD-10-CM | POA: Insufficient documentation

## 2013-03-19 DIAGNOSIS — Z79899 Other long term (current) drug therapy: Secondary | ICD-10-CM | POA: Insufficient documentation

## 2013-03-19 DIAGNOSIS — E785 Hyperlipidemia, unspecified: Secondary | ICD-10-CM | POA: Insufficient documentation

## 2013-03-19 DIAGNOSIS — I129 Hypertensive chronic kidney disease with stage 1 through stage 4 chronic kidney disease, or unspecified chronic kidney disease: Secondary | ICD-10-CM | POA: Insufficient documentation

## 2013-03-19 DIAGNOSIS — E119 Type 2 diabetes mellitus without complications: Secondary | ICD-10-CM | POA: Insufficient documentation

## 2013-03-19 DIAGNOSIS — I5021 Acute systolic (congestive) heart failure: Secondary | ICD-10-CM

## 2013-03-19 DIAGNOSIS — I509 Heart failure, unspecified: Secondary | ICD-10-CM | POA: Insufficient documentation

## 2013-03-19 DIAGNOSIS — M48061 Spinal stenosis, lumbar region without neurogenic claudication: Secondary | ICD-10-CM | POA: Insufficient documentation

## 2013-03-19 DIAGNOSIS — N189 Chronic kidney disease, unspecified: Secondary | ICD-10-CM

## 2013-03-19 DIAGNOSIS — I5022 Chronic systolic (congestive) heart failure: Secondary | ICD-10-CM | POA: Insufficient documentation

## 2013-03-19 DIAGNOSIS — I2510 Atherosclerotic heart disease of native coronary artery without angina pectoris: Secondary | ICD-10-CM

## 2013-03-19 DIAGNOSIS — I2589 Other forms of chronic ischemic heart disease: Secondary | ICD-10-CM | POA: Insufficient documentation

## 2013-03-19 DIAGNOSIS — I517 Cardiomegaly: Secondary | ICD-10-CM

## 2013-03-19 DIAGNOSIS — Z7982 Long term (current) use of aspirin: Secondary | ICD-10-CM | POA: Insufficient documentation

## 2013-03-19 DIAGNOSIS — N184 Chronic kidney disease, stage 4 (severe): Secondary | ICD-10-CM | POA: Insufficient documentation

## 2013-03-19 DIAGNOSIS — I255 Ischemic cardiomyopathy: Secondary | ICD-10-CM

## 2013-03-19 DIAGNOSIS — Z86718 Personal history of other venous thrombosis and embolism: Secondary | ICD-10-CM | POA: Insufficient documentation

## 2013-03-19 DIAGNOSIS — G589 Mononeuropathy, unspecified: Secondary | ICD-10-CM | POA: Insufficient documentation

## 2013-03-19 MED ORDER — HYDRALAZINE HCL 25 MG PO TABS: 12.5000 mg | ORAL_TABLET | Freq: Three times a day (TID) | ORAL | Status: DC

## 2013-03-19 MED ORDER — ISOSORBIDE MONONITRATE ER 30 MG PO TB24: 30.0000 mg | ORAL_TABLET | Freq: Every day | ORAL | Status: DC

## 2013-03-19 NOTE — Progress Notes (Signed)
Patient ID: [name], male   DOB: [birth year], 72 y.o.   MRN: [id number]   Weight Range 196-199 pounds  Baseline proBNP    PCP: Dr Loleta Chance  HPI: [name] is a 72 yo male with history of DM, HTN, hyperlipidemia, CKD stage III (Creatinine 1.7) and DVT (RUE). Diagnosed with systolic HF in 7/14 with EF 20%. Found to have CAD with totally occluded RCA and high-grade lesion in proximal portion of small LCX. LV dysfunction felt to be out of proportion to CAD. Managed medically.   While in the hospital had intermittent atrial flutter while in the hospital thought to be from Milrinone. Diuresed with lasix and milrinone. Discharge weight 196 pounds.   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 The RCA is occluded with collaterals and the proximal LCx has a tight stenosis.  ECHO 03/19/13: EF 20%, mild AS  Follow up: Doing well. Weight is up 9 lbs since last visit. He is not on Metformin because it was stopped d/t increase in Cr in the hospital. Has been taking lasix 40 mg daily instead of BID. Has a scale, but can't get to work. Reports feeling pretty good other than arthritis pain. Denies SOB, orthopnea, CP or edema. DOE with going up hills or stairs. Drinking less than 2 liters a day and following a low salt diet.   11/20/12 Creatinine 1.5 Potassium 4.1 11/27/12 Creatinine 1.39 Potassium 3.9 01/09/13 Creatinine 2.05, KCL 5.5, pro-BNP 1187, cholesterol 121, TG 72, HDL 29, LDL 78 01/15/13 K+ 5.4, creatinine 1.7 01/24/13: K+ 4.7, creatinine 1.79, proBNP 2103  SH: Married, lives at home with his wife.  Nonsmoker.   FH:  CAD  ROS: All systems negative except as listed in HPI, PMH and Problem List.  Past Medical History  Diagnosis Date  . Diabetes mellitus   . Hypertension   . Hyperlipidemia    . Cervical spinal stenosis   . Lumbar spinal stenosis   . 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 DVT (deep venous thrombosis)     RUE 10/2011  . Anemia   . Cardiomyopathy     a. 10/2012 Echo: EF 20-25%, diff HK, mod dil LA/RV/RA.    Current Outpatient Prescriptions  Medication Sig Dispense Refill  . acarbose (PRECOSE) 50 MG tablet Take 50 mg by mouth 3 (three) times daily with meals.      Marland Kitchen aspirin 81 MG chewable tablet Chew 81 mg by mouth daily.      . bisacodyl (DULCOLAX) 10 MG suppository Place 10 mg rectally as needed for constipation. If MOM doesn't work      . calcium elemental as carbonate (BARIATRIC TUMS ULTRA) 400 MG tablet Chew 1,000 mg by mouth as needed for heartburn.      . carvedilol (COREG) 12.5 MG tablet Take 12.5 mg by mouth 2 (two) times daily with a meal.      . docusate sodium (COLACE) 100 MG capsule Take 100 mg by mouth 2 (two) times daily as needed.      . fluticasone (FLONASE) 50 MCG/ACT nasal spray Place 2 sprays into the nose daily.      . furosemide (LASIX) 40 MG tablet Take 40 mg by mouth daily.      Marland Kitchen  gabapentin (NEURONTIN) 300 MG capsule Take 1 capsule (300 mg total) by mouth at bedtime.      Marland Kitchen glimepiride (AMARYL) 1 MG tablet Take 1 mg by mouth 2 (two) times daily before a meal.      . HYDROcodone-acetaminophen (NORCO/VICODIN) 5-325 MG per tablet Take 1 tablet by mouth every 4 (four) hours as needed for pain.  180 tablet  0  . LORazepam (ATIVAN) 0.5 MG tablet Take 1 tablet (0.5 mg total) by mouth every 6 (six) hours as needed for anxiety. For anxiety  15 tablet  0  . magnesium hydroxide (MILK OF MAGNESIA) 400 MG/5ML suspension Take 30 mL by mouth daily as needed for constipation.      . pravastatin (PRAVACHOL) 40 MG tablet Take 1 tablet (40 mg total) by mouth at bedtime.  30 tablet  3   No current facility-administered medications for this encounter.   Filed Vitals:   03/19/13 1618  BP: 118/60  Pulse: 84  Weight: 186 lb 4 oz (84.482  kg)  SpO2: 97%    PHYSICAL EXAM: General: Elderly. No resp difficulty, uses walker and cane HEENT: normal Neck: supple. JVP 10 Carotids 2+ bilaterally; no bruits. No lymphadenopathy or thryomegaly appreciated. Cor: PMI normal. Distant heart sounds. Regular rate & rhythm. No rubs or gallops +SEM RSB  Lungs: clear Abdomen: soft, nontender, nondistended. No hepatosplenomegaly. No bruits or masses. Good bowel sounds. Extremities: no cyanosis, clubbing, rash.  2+ bilateral edema. Neuro: alert & orientedx3, cranial nerves grossly intact. Moves all 4 extremities w/o difficulty. Affect pleasant.   Assessment/Plan:  1. Chronic systolic CHF: Ischemic CMP with chronic LBBB, EF 20-25% (03/2013) - NYHA III symptoms. Volume status elevated and weight is up 9 lbs. Will increase lasix to 40 mg BID for three days. In the past was on 40 mg BID, however Cr bumped. May benefit from 40 q am and 20 mg q pm.  - ECHO reviewed by Dr. Gala Romney and EF remains less than 35%. With LBBB will refer to EP for consideration of CRT-D, preferably [employer]tr[employer] for the HF hemodynamic monitoring. - Continue coreg 12.5 mg BID. He is not on an ACE-I or Spiro with CRI. - Will add hydralazine 12.5 mg TID and IMDUR 30 mg daily for afterload reduction. Told to call if ne notices any dizziness. - Reinforced the need and importance of daily weights, a low sodium diet, and fluid restriction (less than 2 L a day). Instructed to call the HF clinic if weight increases more than 3 lbs overnight or 5 lbs in a week.  2. CAD: LHC at last 12/2012 showed occluded RCA (chronic) with 90% stenosis in small LCx.  No chest pain.  It was decided to avoid PCI to LCx as the vessel was small, he had no chest pain and PCI was unlikely to help LV function significantly, and PCI could compromise the major OM.   - Continue ASA, statin and BB   3. Atrial flutter: - Appears to be in NSR today. If atrial flutter recurs, will need anticoagulation 4. Chronic  kidney disease, Stage 3-4: Stable, check BMET next visit.  F/U 6 weeks Aundria Rud NP-C 03/19/2013  Patient seen and examined with Ulla Potash, NP. We discussed all aspects of the encounter. I agree with the assessment and plan as stated above.I reviewed echo personally in clinic EF remains 20-25%. Symptomatically improved NYHA II-III. Volume status up however. Will increase lasix to 40 bid x 3 days. Then back to 40 daily. Long discussion  about need for dietary restriction and use of sliding-scale diuretics. Suspect he may do better with lasix 40 am 20 pm. No ACE/ARB due to renal failure. Start hydralazine/nitrates. Refer to EP for ICD. CAD is stable - continue to manage medically.    Total time spent 45 minutes with over half of the time spent discussing issues above.  Sherline Eberwein,MD 10:02 PM

## 2013-03-19 NOTE — Patient Instructions (Addendum)
Increase lasix to 40 mg (2 pills) a day for 3 days, then go back to 40 mg daily.  Start taking hydralazine 12.5 mg (1/2 tablet) three times a day.  Start taking IMDUR 30 mg (1 tablet) daily.  Will refer to electrophysiology for consideration of ICD.  Follow up in 6 weeks.  Do the following things EVERYDAY: 1) Weigh yourself in the morning before breakfast. Write it down and keep it in a log. 2) Take your medicines as prescribed 3) Eat low salt foods-Limit salt (sodium) to 2000 mg per day.  4) Stay as active as you can everyday 5) Limit all fluids for the day to less than 2 liters

## 2013-03-19 NOTE — Progress Notes (Signed)
  Echocardiogram 2D Echocardiogram has been performed.  Arvil Chaco 03/19/2013, 3:31 PM

## 2013-04-03 ENCOUNTER — Encounter: Payer: Self-pay | Admitting: *Deleted

## 2013-04-16 ENCOUNTER — Encounter: Payer: Self-pay | Admitting: *Deleted

## 2013-04-16 ENCOUNTER — Encounter: Payer: Self-pay | Admitting: Internal Medicine

## 2013-04-16 ENCOUNTER — Ambulatory Visit (INDEPENDENT_AMBULATORY_CARE_PROVIDER_SITE_OTHER): Payer: Medicare Other | Admitting: Internal Medicine

## 2013-04-16 VITALS — BP 130/71 | HR 77 | Ht 70.0 in | Wt 185.0 lb

## 2013-04-16 DIAGNOSIS — I255 Ischemic cardiomyopathy: Secondary | ICD-10-CM

## 2013-04-16 DIAGNOSIS — I5022 Chronic systolic (congestive) heart failure: Secondary | ICD-10-CM

## 2013-04-16 DIAGNOSIS — I509 Heart failure, unspecified: Secondary | ICD-10-CM

## 2013-04-16 DIAGNOSIS — I2589 Other forms of chronic ischemic heart disease: Secondary | ICD-10-CM

## 2013-04-16 LAB — BASIC METABOLIC PANEL
BUN: 50 mg/dL — ABNORMAL HIGH (ref 6–23)
CALCIUM: 9.6 mg/dL (ref 8.4–10.5)
CHLORIDE: 99 meq/L (ref 96–112)
CO2: 29 meq/L (ref 19–32)
Creatinine, Ser: 1.8 mg/dL — ABNORMAL HIGH (ref 0.4–1.5)
GFR: 40.37 mL/min — ABNORMAL LOW (ref >60.00)
Glucose, Bld: 84 mg/dL (ref 70–99)
POTASSIUM: 4.2 meq/L (ref 3.5–5.1)
SODIUM: 136 meq/L (ref 135–145)

## 2013-04-16 LAB — CBC WITH DIFFERENTIAL/PLATELET
BASOS PCT: 0.4 % (ref 0.0–3.0)
Basophils Absolute: 0 10*3/uL (ref 0.0–0.1)
EOS ABS: 0.3 10*3/uL (ref 0.0–0.7)
Eosinophils Relative: 4.4 % (ref 0.0–5.0)
HCT: 32.6 % — ABNORMAL LOW (ref 39.0–52.0)
HEMOGLOBIN: 10.9 g/dL — AB (ref 13.0–17.0)
LYMPHS PCT: 20.2 % (ref 12.0–46.0)
Lymphs Abs: 1.4 10*3/uL (ref 0.7–4.0)
MCHC: 33.3 g/dL (ref 30.0–36.0)
MCV: 93.6 fl (ref 78.0–100.0)
Monocytes Absolute: 0.7 10*3/uL (ref 0.1–1.0)
Monocytes Relative: 10 % (ref 3.0–12.0)
NEUTROS ABS: 4.4 10*3/uL (ref 1.4–7.7)
Neutrophils Relative %: 65 % (ref 43.0–77.0)
Platelets: 165 10*3/uL (ref 150.0–400.0)
RBC: 3.48 Mil/uL — AB (ref 4.22–5.81)
RDW: 14.7 % — ABNORMAL HIGH (ref 11.5–14.6)
WBC: 6.8 10*3/uL (ref 4.5–10.5)

## 2013-04-16 NOTE — Progress Notes (Signed)
HPI [name] is referred today for consideration for ICD implant. He is a pleasant retired Doctor, hospitalblacksmith with a h/o longstanding CHF, HTN, and severe arthritis. He has LBBB and class 3 CHF symptoms and an EF of 20% which has been longstanding despite maximal medical therapy. He has chronic peripheral edema. He has not had syncope. He is limited in his activity by severe back pain. He does have CAD but it is thought that the severity of his CAD is not commensurate with the degree of his LV dysfunction. No Known Allergies   Current Outpatient Prescriptions  Medication Sig Dispense Refill  . acarbose (PRECOSE) 50 MG tablet Take 1 tablet 1-3 times a day      . aspirin 81 MG chewable tablet Chew 81 mg by mouth daily.      . bisacodyl (DULCOLAX) 10 MG suppository Place 10 mg rectally as needed for constipation. If MOM doesn't work      . carvedilol (COREG) 12.5 MG tablet Take 12.5 mg by mouth 2 (two) times daily with a meal.      . docusate sodium (COLACE) 100 MG capsule Take 100 mg by mouth 2 (two) times daily as needed.      . fluticasone (FLONASE) 50 MCG/ACT nasal spray Place 2 sprays into the nose daily.      . furosemide (LASIX) 40 MG tablet Take 40-80 mg by mouth daily.       Marland Kitchen. gabapentin (NEURONTIN) 300 MG capsule Take 1 capsule (300 mg total) by mouth at bedtime.      Marland Kitchen. glimepiride (AMARYL) 1 MG tablet Take 1 mg by mouth 2 (two) times daily before a meal.      . guaiFENesin (MUCINEX) 600 MG 12 hr tablet Take 600 mg by mouth 2 (two) times daily.      . hydrALAZINE (APRESOLINE) 25 MG tablet Take 0.5 tablets (12.5 mg total) by mouth 3 (three) times daily.  45 tablet  3  . HYDROcodone-acetaminophen (NORCO/VICODIN) 5-325 MG per tablet Take 1 tablet by mouth every 4 (four) hours as needed for pain.  180 tablet  0  . isosorbide mononitrate (IMDUR) 30 MG 24 hr tablet Take 1 tablet (30 mg total) by mouth daily.  30 tablet  3  . LORazepam (ATIVAN) 0.5 MG tablet Take 1 tablet (0.5 mg total) by  mouth every 6 (six) hours as needed for anxiety. For anxiety  15 tablet  0  . metFORMIN (GLUCOPHAGE) 500 MG tablet Take by mouth 2 (two) times daily with a meal.      . pravastatin (PRAVACHOL) 40 MG tablet Take 1 tablet (40 mg total) by mouth at bedtime.  30 tablet  3  . magnesium hydroxide (MILK OF MAGNESIA) 400 MG/5ML suspension Take 30 mL by mouth daily as needed for constipation.       No current facility-administered medications for this visit.     Past Medical History  Diagnosis Date  . Diabetes mellitus   . Hypertension   . Hyperlipidemia   . Cervical spinal stenosis   . Lumbar spinal stenosis   . 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 DVT (deep venous thrombosis)     RUE 10/2011  . Anemia   . Cardiomyopathy     a. 10/2012 Echo: EF 20-25%, diff HK, mod dil LA/RV/RA.  Marland Kitchen. CAD (coronary artery disease)   . Atrial flutter   . CKD (chronic kidney disease), stage III   . Arthritis   .  Venous insufficiency   . Frequent falls   . 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 Chronic systolic congestive heart failure   . Quadriparesis   . Hypoglycemia   . Cervical myelopathy   . Acute encephalopathy   . Spondylosis of cervical joint   . Mononeuritis of unspecified site     ROS:   All systems reviewed and negative except as noted in the HPI.   Past Surgical History  Procedure Laterality Date  . Total hip arthroplasty    . Toe amputation    . Anterior cervical decomp/discectomy fusion  10/31/2011    Procedure: ANTERIOR CERVICAL DECOMPRESSION/DISCECTOMY FUSION 2 LEVELS;  Surgeon: Cristi Loron, MD;  Location: [facility];  Service: Neurosurgery;  Laterality: N/A;  Cervical Four-Five Cervical Five-Six Anterior Cervical Decompression with fusion interbody prothesis plating and bonegraft  . Carpal tunnel release       Family History  Problem Relation Age of Onset  . Diabetes Brother   . Cancer Father     Lung cancer     History     Social History  . Marital Status: Married    Spouse Name: N/A    Number of Children: N/A  . Years of Education: N/A   Occupational History  . Not on file.   Social History Main Topics  . Smoking status: Never Smoker   . Smokeless tobacco: Never Used  . Alcohol Use: No  . Drug Use: No  . Sexual Activity: Not on file   Other Topics Concern  . Not on file   Social History Narrative  . No narrative on file     BP 130/71  Pulse 77  Ht 5\' 10"  (1.778 m)  Wt 185 lb (83.915 kg)  BMI 26.54 kg/m2  Physical Exam:  Chronically ill appearing 73 yo man, in a wheel chair, NAD HEENT: Unremarkable Neck:  No JVD, no thyromegally Back:  No CVA tenderness Lungs:  Clear with no wheezes HEART:  Regular rate rhythm, no murmurs, no rubs, no clicks Abd:  soft, positive bowel sounds, no organomegally, no rebound, no guarding Ext:  2 plus pulses, no edema, no cyanosis, no clubbing Skin:  No rashes no nodules Neuro:  CN II through XII intact, motor grossly intact  EKG - NSR with LBBB, QRS duration 150 ms  Assess/Plan:

## 2013-04-16 NOTE — Assessment & Plan Note (Signed)
I have discussed the treatment options with the patient. He has a clear indication for ICD implantation with an EF 20%, class 3 CHF, and LBBB despite maximal medication. The risks/benefits/goals/expectations of BiV ICD implant have been discussed with the patient and he wishes to proceed.

## 2013-04-16 NOTE — Assessment & Plan Note (Signed)
He denies anginal symptoms. No change in medical therapy. 

## 2013-04-16 NOTE — Patient Instructions (Signed)
Your physician has recommended that you have a Bi-V defibrillator inserted. An implantable cardioverter defibrillator (ICD) is a small device that is placed in your chest or, in rare cases, your abdomen. This device uses electrical pulses or shocks to help control life-threatening, irregular heartbeats that could lead the heart to suddenly stop beating (sudden cardiac arrest). Leads are attached to the ICD that goes into your heart. This is done in the hospital and usually requires an overnight stay. Please see the instruction sheet given to you today for more information.   See instruction sheet  

## 2013-04-24 ENCOUNTER — Telehealth: Payer: Self-pay | Admitting: Internal Medicine

## 2013-04-24 NOTE — Telephone Encounter (Signed)
New problem   Pt need to speak to nurse concerning his procedure at the hospital next week.

## 2013-04-24 NOTE — Telephone Encounter (Signed)
Called and spoke with patient's wife.  Answered all her questions

## 2013-04-30 ENCOUNTER — Telehealth: Payer: Self-pay | Admitting: Internal Medicine

## 2013-04-30 MED ORDER — CEFAZOLIN SODIUM-DEXTROSE 2-3 GM-% IV SOLR
2.0000 g | INTRAVENOUS | Status: AC
  Filled 2013-04-30: qty 50

## 2013-04-30 MED ORDER — SODIUM CHLORIDE 0.9 % IR SOLN
80.0000 mg | Status: AC
  Filled 2013-04-30: qty 2

## 2013-04-30 NOTE — Telephone Encounter (Signed)
New message   Upcoming procedure on tomorrow .  C/O nerve pain in left arm.

## 2013-04-30 NOTE — Telephone Encounter (Signed)
Spoke with patient and let him know he would need to call the hospital if he can not find a ride   He is still trying to find a ride and will cancel if he can't

## 2013-05-01 ENCOUNTER — Ambulatory Visit (HOSPITAL_COMMUNITY): Admission: RE | Admit: 2013-05-01 | Payer: Medicare Other | Source: Ambulatory Visit | Admitting: Internal Medicine

## 2013-05-01 ENCOUNTER — Telehealth: Payer: Self-pay | Admitting: Internal Medicine

## 2013-05-01 SURGERY — BI-VENTRICULAR IMPLANTABLE CARDIOVERTER DEFIBRILLATOR  (CRT-D)
Anesthesia: LOCAL

## 2013-05-01 NOTE — Telephone Encounter (Signed)
Pts emergency contact, Yancarlos Juhnke, states that the pt is unable to have surgery today due to problems at home. She states that he does want to reschedule. Vernona Rieger is advised that Dr Lubertha Basque nurse, Tresa Endo, is out of office today and for her and the pt to discuss what date and time is best for the pt to have surgery and to let Tresa Endo know when she calls him back. She verbalized understanding.

## 2013-05-01 NOTE — Telephone Encounter (Signed)
New Problem:  Wife called and stated her husband is having some issues - Water pump or pipes at the house broke... States he will not be able to make it for his surgery today. She is requesting a call back to reschedule.Marland KitchenMarland Kitchen

## 2013-05-02 ENCOUNTER — Inpatient Hospital Stay (HOSPITAL_COMMUNITY): Admission: RE | Admit: 2013-05-02 | Payer: Medicare Other | Source: Ambulatory Visit

## 2013-05-06 NOTE — Telephone Encounter (Signed)
Left message for patient to return my call to reschedule his procedure

## 2013-05-08 ENCOUNTER — Telehealth: Payer: Self-pay | Admitting: Internal Medicine

## 2013-05-08 NOTE — Telephone Encounter (Signed)
New message          Pt is having a defibelator put in and he is ready to settle on a date

## 2013-05-08 NOTE — Telephone Encounter (Signed)
Follow up     Patient wife calling this is the date they can do  2/12,

## 2013-05-08 NOTE — Telephone Encounter (Signed)
Will reschedule to 05/16/13 at 1:30  Patient to be at the hospital at 11:30.  NPO after MN  I have left a message with the patient with these instructions

## 2013-05-08 NOTE — Telephone Encounter (Signed)
Spoke with wife and gave her dates.  She is going to call and arrange a ride and call me back

## 2013-05-08 NOTE — Telephone Encounter (Signed)
Spoke with wife  Aware of time

## 2013-05-14 ENCOUNTER — Encounter (HOSPITAL_COMMUNITY): Payer: Self-pay | Admitting: Pharmacy Technician

## 2013-05-15 MED ORDER — SODIUM CHLORIDE 0.9 % IV SOLN
INTRAVENOUS | Status: DC
  Administered 2013-05-16: 13:00:00 via INTRAVENOUS

## 2013-05-15 MED ORDER — CHLORHEXIDINE GLUCONATE 4 % EX LIQD
60.0000 mL | Freq: Once | CUTANEOUS | Status: DC
  Filled 2013-05-15: qty 60

## 2013-05-16 ENCOUNTER — Ambulatory Visit (HOSPITAL_COMMUNITY)
Admission: RE | Admit: 2013-05-16 | Discharge: 2013-05-17 | Disposition: A | Discharge status: Home / Self Care | Payer: Medicare Other | Source: Ambulatory Visit | Attending: Internal Medicine | Admitting: Internal Medicine

## 2013-05-16 ENCOUNTER — Encounter (HOSPITAL_COMMUNITY)
Admission: RE | Disposition: A | Discharge status: Home / Self Care | Payer: Self-pay | Source: Ambulatory Visit | Attending: Internal Medicine

## 2013-05-16 ENCOUNTER — Encounter (HOSPITAL_COMMUNITY): Payer: Self-pay | Admitting: General Practice

## 2013-05-16 DIAGNOSIS — E785 Hyperlipidemia, unspecified: Secondary | ICD-10-CM | POA: Insufficient documentation

## 2013-05-16 DIAGNOSIS — I428 Other cardiomyopathies: Secondary | ICD-10-CM | POA: Insufficient documentation

## 2013-05-16 DIAGNOSIS — I5022 Chronic systolic (congestive) heart failure: Secondary | ICD-10-CM

## 2013-05-16 DIAGNOSIS — I2589 Other forms of chronic ischemic heart disease: Secondary | ICD-10-CM

## 2013-05-16 DIAGNOSIS — N183 Chronic kidney disease, stage 3 unspecified: Secondary | ICD-10-CM | POA: Insufficient documentation

## 2013-05-16 DIAGNOSIS — I447 Left bundle-branch block, unspecified: Secondary | ICD-10-CM | POA: Insufficient documentation

## 2013-05-16 DIAGNOSIS — I129 Hypertensive chronic kidney disease with stage 1 through stage 4 chronic kidney disease, or unspecified chronic kidney disease: Secondary | ICD-10-CM | POA: Insufficient documentation

## 2013-05-16 DIAGNOSIS — I509 Heart failure, unspecified: Secondary | ICD-10-CM | POA: Insufficient documentation

## 2013-05-16 DIAGNOSIS — E119 Type 2 diabetes mellitus without complications: Secondary | ICD-10-CM | POA: Insufficient documentation

## 2013-05-16 DIAGNOSIS — I2510 Atherosclerotic heart disease of native coronary artery without angina pectoris: Secondary | ICD-10-CM | POA: Insufficient documentation

## 2013-05-16 DIAGNOSIS — IMO0002 Reserved for concepts with insufficient information to code with codable children: Secondary | ICD-10-CM | POA: Insufficient documentation

## 2013-05-16 HISTORY — DX: Depression, unspecified: F32.A

## 2013-05-16 HISTORY — DX: Cardiac arrhythmia, unspecified: I49.9

## 2013-05-16 HISTORY — PX: BI-VENTRICULAR IMPLANTABLE CARDIOVERTER DEFIBRILLATOR  (CRT-D): SHX5747

## 2013-05-16 HISTORY — DX: Personal history of other diseases of the digestive system: Z87.19

## 2013-05-16 HISTORY — DX: Major depressive disorder, single episode, unspecified: F32.9

## 2013-05-16 HISTORY — DX: Pneumonia, unspecified organism: J18.9

## 2013-05-16 HISTORY — DX: Type 2 diabetes mellitus without complications: E11.9

## 2013-05-16 HISTORY — PX: BI-VENTRICULAR IMPLANTABLE CARDIOVERTER DEFIBRILLATOR: SHX5459

## 2013-05-16 LAB — BASIC METABOLIC PANEL
BUN: 43 mg/dL — ABNORMAL HIGH (ref 6–23)
CALCIUM: 10.1 mg/dL (ref 8.4–10.5)
CO2: 26 mEq/L (ref 19–32)
Chloride: 103 mEq/L (ref 96–112)
Creatinine, Ser: 1.45 mg/dL — ABNORMAL HIGH (ref 0.50–1.35)
GFR calc Af Amer: 54 mL/min — ABNORMAL LOW (ref >90)
GFR calc non Af Amer: 47 mL/min — ABNORMAL LOW (ref >90)
Glucose, Bld: 79 mg/dL (ref 70–99)
Potassium: 5 mEq/L (ref 3.7–5.3)
SODIUM: 143 meq/L (ref 137–147)

## 2013-05-16 LAB — GLUCOSE, CAPILLARY
Glucose-Capillary: 79 mg/dL (ref 70–99)
Glucose-Capillary: 83 mg/dL (ref 70–99)

## 2013-05-16 LAB — SURGICAL PCR SCREEN
MRSA, PCR: NEGATIVE
Staphylococcus aureus: NEGATIVE

## 2013-05-16 LAB — CBC
HCT: 35.1 % — ABNORMAL LOW (ref 39.0–52.0)
Hemoglobin: 11.9 g/dL — ABNORMAL LOW (ref 13.0–17.0)
MCH: 31.5 pg (ref 26.0–34.0)
MCHC: 33.9 g/dL (ref 30.0–36.0)
MCV: 92.9 fL (ref 78.0–100.0)
PLATELETS: 197 10*3/uL (ref 150–400)
RBC: 3.78 MIL/uL — ABNORMAL LOW (ref 4.22–5.81)
RDW: 13.8 % (ref 11.5–15.5)
WBC: 7.8 10*3/uL (ref 4.0–10.5)

## 2013-05-16 SURGERY — BI-VENTRICULAR IMPLANTABLE CARDIOVERTER DEFIBRILLATOR  (CRT-D)
Anesthesia: LOCAL

## 2013-05-16 MED ORDER — MIDAZOLAM HCL 5 MG/5ML IJ SOLN
INTRAMUSCULAR | Status: AC
  Filled 2013-05-16: qty 5

## 2013-05-16 MED ORDER — METFORMIN HCL 500 MG PO TABS
500.0000 mg | ORAL_TABLET | Freq: Two times a day (BID) | ORAL | Status: DC
  Administered 2013-05-17: 500 mg via ORAL
  Filled 2013-05-16 (×3): qty 1

## 2013-05-16 MED ORDER — LIDOCAINE HCL (PF) 1 % IJ SOLN
INTRAMUSCULAR | Status: AC
  Filled 2013-05-16: qty 60

## 2013-05-16 MED ORDER — GENTAMICIN SULFATE 40 MG/ML IJ SOLN
80.0000 mg | INTRAMUSCULAR | Status: DC
  Filled 2013-05-16: qty 2

## 2013-05-16 MED ORDER — CARVEDILOL 12.5 MG PO TABS
12.5000 mg | ORAL_TABLET | Freq: Two times a day (BID) | ORAL | Status: DC
  Administered 2013-05-17: 12.5 mg via ORAL
  Filled 2013-05-16 (×3): qty 1

## 2013-05-16 MED ORDER — GABAPENTIN 300 MG PO CAPS
300.0000 mg | ORAL_CAPSULE | Freq: Three times a day (TID) | ORAL | Status: DC
  Administered 2013-05-16 – 2013-05-17 (×3): 300 mg via ORAL
  Filled 2013-05-16 (×6): qty 1

## 2013-05-16 MED ORDER — ISOSORBIDE MONONITRATE ER 30 MG PO TB24
30.0000 mg | ORAL_TABLET | Freq: Every day | ORAL | Status: DC
  Administered 2013-05-16 – 2013-05-17 (×2): 30 mg via ORAL
  Filled 2013-05-16 (×2): qty 1

## 2013-05-16 MED ORDER — MUPIROCIN 2 % EX OINT
TOPICAL_OINTMENT | Freq: Two times a day (BID) | CUTANEOUS | Status: DC
  Administered 2013-05-16: 1 via NASAL
  Filled 2013-05-16 (×2): qty 22

## 2013-05-16 MED ORDER — GUAIFENESIN ER 600 MG PO TB12
600.0000 mg | ORAL_TABLET | Freq: Two times a day (BID) | ORAL | Status: DC
  Administered 2013-05-16 – 2013-05-17 (×2): 600 mg via ORAL
  Filled 2013-05-16 (×3): qty 1

## 2013-05-16 MED ORDER — HYDRALAZINE HCL 25 MG PO TABS
12.5000 mg | ORAL_TABLET | Freq: Three times a day (TID) | ORAL | Status: DC
  Administered 2013-05-16 – 2013-05-17 (×3): 12.5 mg via ORAL
  Filled 2013-05-16 (×6): qty 0.5

## 2013-05-16 MED ORDER — ACARBOSE 50 MG PO TABS
50.0000 mg | ORAL_TABLET | Freq: Three times a day (TID) | ORAL | Status: DC
  Administered 2013-05-17: 50 mg via ORAL
  Filled 2013-05-16 (×4): qty 1

## 2013-05-16 MED ORDER — FENTANYL CITRATE 0.05 MG/ML IJ SOLN
INTRAMUSCULAR | Status: AC
  Filled 2013-05-16: qty 2

## 2013-05-16 MED ORDER — CEFAZOLIN SODIUM-DEXTROSE 2-3 GM-% IV SOLR
2.0000 g | Freq: Four times a day (QID) | INTRAVENOUS | Status: AC
  Administered 2013-05-16 – 2013-05-17 (×3): 2 g via INTRAVENOUS
  Filled 2013-05-16 (×4): qty 50

## 2013-05-16 MED ORDER — CEFAZOLIN SODIUM-DEXTROSE 2-3 GM-% IV SOLR
2.0000 g | Freq: Four times a day (QID) | INTRAVENOUS | Status: DC
  Filled 2013-05-16 (×3): qty 50

## 2013-05-16 MED ORDER — ASPIRIN 81 MG PO CHEW
81.0000 mg | CHEWABLE_TABLET | Freq: Every day | ORAL | Status: DC
  Administered 2013-05-17: 81 mg via ORAL
  Filled 2013-05-16 (×2): qty 1

## 2013-05-16 MED ORDER — FUROSEMIDE 40 MG PO TABS
40.0000 mg | ORAL_TABLET | Freq: Every day | ORAL | Status: DC
  Administered 2013-05-16 – 2013-05-17 (×2): 40 mg via ORAL
  Filled 2013-05-16 (×2): qty 1

## 2013-05-16 MED ORDER — LORAZEPAM 0.5 MG PO TABS: 0.5000 mg | ORAL_TABLET | Freq: Four times a day (QID) | ORAL | Status: DC | PRN

## 2013-05-16 MED ORDER — CEFAZOLIN SODIUM-DEXTROSE 2-3 GM-% IV SOLR
2.0000 g | INTRAVENOUS | Status: DC
  Filled 2013-05-16: qty 50

## 2013-05-16 MED ORDER — ONDANSETRON HCL 4 MG/2ML IJ SOLN
4.0000 mg | Freq: Four times a day (QID) | INTRAMUSCULAR | Status: DC | PRN
Start: 2013-05-16 — End: 2013-05-17

## 2013-05-16 MED ORDER — GLIMEPIRIDE 1 MG PO TABS
1.0000 mg | ORAL_TABLET | Freq: Two times a day (BID) | ORAL | Status: DC
  Administered 2013-05-17: 1 mg via ORAL
  Filled 2013-05-16 (×3): qty 1

## 2013-05-16 MED ORDER — HYDROCODONE-ACETAMINOPHEN 5-325 MG PO TABS
2.0000 | ORAL_TABLET | ORAL | Status: DC | PRN
  Administered 2013-05-16 – 2013-05-17 (×3): 2 via ORAL
  Filled 2013-05-16 (×3): qty 2

## 2013-05-16 MED ORDER — ACETAMINOPHEN 325 MG PO TABS: 325.0000 mg | ORAL_TABLET | ORAL | Status: DC | PRN

## 2013-05-16 MED ORDER — SIMVASTATIN 5 MG PO TABS
5.0000 mg | ORAL_TABLET | Freq: Every day | ORAL | Status: DC
  Administered 2013-05-16: 5 mg via ORAL
  Filled 2013-05-16 (×2): qty 1

## 2013-05-16 NOTE — CV Procedure (Signed)
BiV ICD implant via the left subclavian vein without immediate complication. G#[id number].

## 2013-05-16 NOTE — Interval H&P Note (Signed)
History and Physical Interval Note:  05/16/2013 2:33 PM  [name]  has presented today for surgery, with the diagnosis of CARDIOMYOPATHY  The various methods of treatment have been discussed with the patient and family. After consideration of risks, benefits and other options for treatment, the patient has consented to  Procedure(s): BI-VENTRICULAR IMPLANTABLE CARDIOVERTER DEFIBRILLATOR  (CRT-D) (N/A) as a surgical intervention .  The patient's history has been reviewed, patient examined, no change in status, stable for surgery.  I have reviewed the patient's chart and labs.  Questions were answered to the patient's satisfaction.     Leonia Reeves.D.

## 2013-05-16 NOTE — H&P (View-Only) (Signed)
    HPI [name] is referred today for consideration for ICD implant. 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 He has LBBB and class 3 CHF symptoms and an EF of 20% which has been longstanding despite maximal medical therapy. He has chronic peripheral edema. He has not had syncope. He is limited in his activity by severe back pain. He does have CAD but it is thought that the severity of his CAD is not commensurate with the degree of his LV dysfunction. No Known Allergies   Current Outpatient Prescriptions  Medication Sig Dispense Refill  . acarbose (PRECOSE) 50 MG tablet Take 1 tablet 1-3 times a day      . aspirin 81 MG chewable tablet Chew 81 mg by mouth daily.      . bisacodyl (DULCOLAX) 10 MG suppository Place 10 mg rectally as needed for constipation. If MOM doesn't work      . carvedilol (COREG) 12.5 MG tablet Take 12.5 mg by mouth 2 (two) times daily with a meal.      . docusate sodium (COLACE) 100 MG capsule Take 100 mg by mouth 2 (two) times daily as needed.      . fluticasone (FLONASE) 50 MCG/ACT nasal spray Place 2 sprays into the nose daily.      . furosemide (LASIX) 40 MG tablet Take 40-80 mg by mouth daily.       . gabapentin (NEURONTIN) 300 MG capsule Take 1 capsule (300 mg total) by mouth at bedtime.      . glimepiride (AMARYL) 1 MG tablet Take 1 mg by mouth 2 (two) times daily before a meal.      . guaiFENesin (MUCINEX) 600 MG 12 hr tablet Take 600 mg by mouth 2 (two) times daily.      . hydrALAZINE (APRESOLINE) 25 MG tablet Take 0.5 tablets (12.5 mg total) by mouth 3 (three) times daily.  45 tablet  3  . HYDROcodone-acetaminophen (NORCO/VICODIN) 5-325 MG per tablet Take 1 tablet by mouth every 4 (four) hours as needed for pain.  180 tablet  0  . isosorbide mononitrate (IMDUR) 30 MG 24 hr tablet Take 1 tablet (30 mg total) by mouth daily.  30 tablet  3  . LORazepam (ATIVAN) 0.5 MG tablet Take 1 tablet (0.5 mg total) by  mouth every 6 (six) hours as needed for anxiety. For anxiety  15 tablet  0  . metFORMIN (GLUCOPHAGE) 500 MG tablet Take by mouth 2 (two) times daily with a meal.      . pravastatin (PRAVACHOL) 40 MG tablet Take 1 tablet (40 mg total) by mouth at bedtime.  30 tablet  3  . magnesium hydroxide (MILK OF MAGNESIA) 400 MG/5ML suspension Take 30 mL by mouth daily as needed for constipation.       No current facility-administered medications for this visit.     Past Medical History  Diagnosis Date  . Diabetes mellitus   . Hypertension   . Hyperlipidemia   . Cervical spinal stenosis   . Lumbar spinal stenosis   . 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 DVT (deep venous thrombosis)     RUE 10/2011  . Anemia   . Cardiomyopathy     a. 10/2012 Echo: EF 20-25%, diff HK, mod dil LA/RV/RA.  . CAD (coronary artery disease)   . Atrial flutter   . CKD (chronic kidney disease), stage III   . Arthritis   .   Venous insufficiency   . Frequent falls   . 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 Chronic systolic congestive heart failure   . Quadriparesis   . Hypoglycemia   . Cervical myelopathy   . Acute encephalopathy   . Spondylosis of cervical joint   . Mononeuritis of unspecified site     ROS:   All systems reviewed and negative except as noted in the HPI.   Past Surgical History  Procedure Laterality Date  . Total hip arthroplasty    . Toe amputation    . Anterior cervical decomp/discectomy fusion  10/31/2011    Procedure: ANTERIOR CERVICAL DECOMPRESSION/DISCECTOMY FUSION 2 LEVELS;  Surgeon: Cristi Loron, MD;  Location: [facility];  Service: Neurosurgery;  Laterality: N/A;  Cervical Four-Five Cervical Five-Six Anterior Cervical Decompression with fusion interbody prothesis plating and bonegraft  . Carpal tunnel release       Family History  Problem Relation Age of Onset  . Diabetes Brother   . Cancer Father     Lung cancer     History     Social History  . Marital Status: Married    Spouse Name: N/A    Number of Children: N/A  . Years of Education: N/A   Occupational History  . Not on file.   Social History Main Topics  . Smoking status: Never Smoker   . Smokeless tobacco: Never Used  . Alcohol Use: No  . Drug Use: No  . Sexual Activity: Not on file   Other Topics Concern  . Not on file   Social History Narrative  . No narrative on file     BP 130/71  Pulse 77  Ht 5\' 10"  (1.778 m)  Wt 185 lb (83.915 kg)  BMI 26.54 kg/m2  Physical Exam:  Chronically ill appearing 73 yo man, in a wheel chair, NAD HEENT: Unremarkable Neck:  No JVD, no thyromegally Back:  No CVA tenderness Lungs:  Clear with no wheezes HEART:  Regular rate rhythm, no murmurs, no rubs, no clicks Abd:  soft, positive bowel sounds, no organomegally, no rebound, no guarding Ext:  2 plus pulses, no edema, no cyanosis, no clubbing Skin:  No rashes no nodules Neuro:  CN II through XII intact, motor grossly intact  EKG - NSR with LBBB, QRS duration 150 ms  Assess/Plan:

## 2013-05-16 NOTE — H&P (Signed)
  ICD Criteria  Current LVEF (within 6 months):20%  NYHA Functional Classification: Class II  Heart Failure History:  Yes, Duration of heart failure since onset is > 9 months  Non-Ischemic Dilated Cardiomyopathy History:  No.  Atrial Fibrillation/Atrial Flutter:  No.  Ventricular Tachycardia History:  No.  Cardiac Arrest History:  No  History of Syndromes with Risk of Sudden Death:  No.  Previous ICD:  No.  Electrophysiology Study: No.  Prior MI: No.  PPM: No.  OSA:  No  Patient Life Expectancy of >=1 year: Yes.  Anticoagulation Therapy:  Patient is NOT on anticoagulation therapy.   Beta Blocker Therapy:  Yes.   Ace Inhibitor/ARB Therapy:  Yes.

## 2013-05-17 ENCOUNTER — Ambulatory Visit (HOSPITAL_COMMUNITY): Payer: Medicare Other

## 2013-05-17 ENCOUNTER — Other Ambulatory Visit: Payer: Self-pay

## 2013-05-17 DIAGNOSIS — I2589 Other forms of chronic ischemic heart disease: Secondary | ICD-10-CM

## 2013-05-17 LAB — GLUCOSE, CAPILLARY
Glucose-Capillary: 113 mg/dL — ABNORMAL HIGH (ref 70–99)
Glucose-Capillary: 112 mg/dL — ABNORMAL HIGH (ref 70–99)

## 2013-05-17 MED ORDER — GABAPENTIN 300 MG PO CAPS
300.0000 mg | ORAL_CAPSULE | Freq: Once | ORAL | Status: AC
  Administered 2013-05-17: 300 mg via ORAL
  Filled 2013-05-17: qty 1

## 2013-05-17 MED ORDER — FLUTICASONE PROPIONATE 50 MCG/ACT NA SUSP
1.0000 | Freq: Every day | NASAL | Status: DC | PRN
  Administered 2013-05-17: 2 via NASAL
  Filled 2013-05-17: qty 16

## 2013-05-17 NOTE — Discharge Instructions (Signed)
Supplemental Discharge Instructions for  Pacemaker/Defibrillator Patients  Activity No heavy lifting or vigorous activity with your left/right arm for 6 to 8 weeks.  Do not raise your left/right arm above your head for one week.  Gradually raise your affected arm as drawn below.           02/15                      02/16                       02/17                      02/18       NO DRIVING for 1 week; you may begin driving on 40/98/1191. WOUND CARE   Keep the wound area clean and dry.  Do not get this area wet for one week. No showers for one week; you may shower on 05/25/2013.   The tape/steri-strips on your wound will fall off; do not pull them off.  No bandage is needed on the site.  DO  NOT apply any creams, oils, or ointments to the wound area.   If you notice any drainage or discharge from the wound, any swelling or bruising at the site, or you develop a fever > 101? F after you are discharged home, call the office at once.  Special Instructions   You are still able to use cellular telephones; use the ear opposite the side where you have your pacemaker/defibrillator.  Avoid carrying your cellular phone near your device.   When traveling through airports, show security personnel your identification card to avoid being screened in the metal detectors.  Ask the security personnel to use the hand wand.   Avoid arc welding equipment, MRI testing (magnetic resonance imaging), TENS units (transcutaneous nerve stimulators).  Call the office for questions about other devices.   Avoid electrical appliances that are in poor condition or are not properly grounded.   Microwave ovens are safe to be near or to operate.  Additional information for defibrillator patients should your device go off:   If your device goes off ONCE and you feel fine afterward, notify the device clinic nurses.   If your device goes off ONCE and you do not feel well afterward, call 911.   If your device goes off  TWICE, call 911.   If your device goes off THREE times in one day, call 911.  DO NOT DRIVE YOURSELF OR A FAMILY MEMBER WITH A DEFIBRILLATOR TO THE HOSPITAL--CALL 911.  Heart Failure Heart failure is a condition in which the heart has trouble pumping blood. This means your heart does not pump blood efficiently for your body to work well. In some cases of heart failure, fluid may back up into your lungs or you may have swelling (edema) in your lower legs. Heart failure is usually a long-term (chronic) condition. It is important for you to take good care of yourself and follow your caregiver's treatment plan. CAUSES  Some health conditions can cause heart failure. Those health conditions include:  High blood pressure (hypertension) causes the heart muscle to work harder than normal. When pressure in the blood vessels is high, the heart needs to pump (contract) with more force in order to circulate blood throughout the body. High blood pressure eventually causes the heart to become stiff and weak.  Coronary artery disease (CAD) is the buildup  of cholesterol and fat (plaque) in the arteries of the heart. The blockage in the arteries deprives the heart muscle of oxygen and blood. This can cause chest pain and may lead to a heart attack. High blood pressure can also contribute to CAD.  Heart attack (myocardial infarction) occurs when 1 or more arteries in the heart become blocked. The loss of oxygen damages the muscle tissue of the heart. When this happens, part of the heart muscle dies. The injured tissue does not contract as well and weakens the heart's ability to pump blood.  Abnormal heart valves can cause heart failure when the heart valves do not open and close properly. This makes the heart muscle pump harder to keep the blood flowing.  Heart muscle disease (cardiomyopathy or myocarditis) is damage to the heart muscle from a variety of causes. These can include drug or alcohol abuse, infections,  or unknown reasons. These can increase the risk of heart failure.  Lung disease makes the heart work harder because the lungs do not work properly. This can cause a strain on the heart, leading it to fail.  Diabetes increases the risk of heart failure. High blood sugar contributes to high fat (lipid) levels in the blood. Diabetes can also cause slow damage to tiny blood vessels that carry important nutrients to the heart muscle. When the heart does not get enough oxygen and food, it can cause the heart to become weak and stiff. This leads to a heart that does not contract efficiently.  Other conditions can contribute to heart failure. These include abnormal heart rhythms, thyroid problems, and low blood counts (anemia). Certain unhealthy behaviors can increase the risk of heart failure. Those unhealthy behaviors include:  Being overweight.  Smoking or chewing tobacco.  Eating foods high in fat and cholesterol.  Abusing illicit drugs or alcohol.  Lacking physical activity. SYMPTOMS  Heart failure symptoms may vary and can be hard to detect. Symptoms may include:  Shortness of breath with activity, such as climbing stairs.  Persistent cough.  Swelling of the feet, ankles, legs, or abdomen.  Unexplained weight gain.  Difficulty breathing when lying flat (orthopnea).  Waking from sleep because of the need to sit up and get more air.  Rapid heartbeat.  Fatigue and loss of energy.  Feeling lightheaded, dizzy, or close to fainting.  Loss of appetite.  Nausea.  Increased urination during the night (nocturia). DIAGNOSIS  A diagnosis of heart failure is based on your history, symptoms, physical examination, and diagnostic tests. Diagnostic tests for heart failure may include:  Echocardiography.  Electrocardiography.  Chest X-ray.  Blood tests.  Exercise stress test.  Cardiac angiography.  Radionuclide scans. TREATMENT  Treatment is aimed at managing the symptoms of  heart failure. Medicines, behavioral changes, or surgical intervention may be necessary to treat heart failure.  Medicines to help treat heart failure may include:  Angiotensin-converting enzyme (ACE) inhibitors. This type of medicine blocks the effects of a blood protein called angiotensin-converting enzyme. ACE inhibitors relax (dilate) the blood vessels and help lower blood pressure.  Angiotensin receptor blockers. This type of medicine blocks the actions of a blood protein called angiotensin. Angiotensin receptor blockers dilate the blood vessels and help lower blood pressure.  Water pills (diuretics). Diuretics cause the kidneys to remove salt and water from the blood. The extra fluid is removed through urination. This loss of extra fluid lowers the volume of blood the heart pumps.  Beta blockers. These prevent the heart from beating too fast  and improve heart muscle strength.  Digitalis. This increases the force of the heartbeat.  Healthy behavior changes include:  Obtaining and maintaining a healthy weight.  Stopping smoking or chewing tobacco.  Eating heart healthy foods.  Limiting or avoiding alcohol.  Stopping illicit drug use.  Physical activity as directed by your caregiver.  Surgical treatment for heart failure may include:  A procedure to open blocked arteries, repair damaged heart valves, or remove damaged heart muscle tissue.  A pacemaker to improve heart muscle function and control certain abnormal heart rhythms.  An internal cardioverter defibrillator to treat certain serious abnormal heart rhythms.  A left ventricular assist device to assist the pumping ability of the heart. HOME CARE INSTRUCTIONS   Take your medicine as directed by your caregiver. Medicines are important in reducing the workload of your heart, slowing the progression of heart failure, and improving your symptoms.  Do not stop taking your medicine unless directed by your caregiver.  Do  not skip any dose of medicine.  Refill your prescriptions before you run out of medicine. Your medicines are needed every day.  Take over-the-counter medicine only as directed by your caregiver or pharmacist.  Engage in moderate physical activity if directed by your caregiver. Moderate physical activity can benefit some people. The elderly and people with severe heart failure should consult with a caregiver for physical activity recommendations.  Eat heart healthy foods. Food choices should be free of trans fat and low in saturated fat, cholesterol, and salt (sodium). Healthy choices include fresh or frozen fruits and vegetables, fish, lean meats, legumes, fat-free or low-fat dairy products, and whole grain or high fiber foods. Talk to a dietitian to learn more about heart healthy foods.  Limit sodium if directed by your caregiver. Sodium restriction may reduce symptoms of heart failure in some people. Talk to a dietitian to learn more about heart healthy seasonings.  Use healthy cooking methods. Healthy cooking methods include roasting, grilling, broiling, baking, poaching, steaming, or stir-frying. Talk to a dietitian to learn more about healthy cooking methods.  Limit fluids if directed by your caregiver. Fluid restriction may reduce symptoms of heart failure in some people.  Weigh yourself every day. Daily weights are important in the early recognition of excess fluid. You should weigh yourself every morning after you urinate and before you eat breakfast. Wear the same amount of clothing each time you weigh yourself. Record your daily weight. Provide your caregiver with your weight record.  Monitor and record your blood pressure if directed by your caregiver.  Check your pulse if directed by your caregiver.  Lose weight if directed by your caregiver. Weight loss may reduce symptoms of heart failure in some people.  Stop smoking or chewing tobacco. Nicotine makes your heart work harder by  causing your blood vessels to constrict. Do not use nicotine gum or patches before talking to your caregiver.  Schedule and attend follow-up visits as directed by your caregiver. It is important to keep all your appointments.  Limit alcohol intake to no more than 1 drink per day for nonpregnant women and 2 drinks per day for men. Drinking more than that is harmful to your heart. Tell your caregiver if you drink alcohol several times a week. Talk with your caregiver about whether alcohol is safe for you. If your heart has already been damaged by alcohol or you have severe heart failure, drinking alcohol should be stopped completely.  Stop illicit drug use.  Stay up-to-date with immunizations. It  is especially important to prevent respiratory infections through current pneumococcal and influenza immunizations.  Manage other health conditions such as hypertension, diabetes, thyroid disease, or abnormal heart rhythms as directed by your caregiver.  Learn to manage stress.  Plan rest periods when fatigued.  Learn strategies to manage high temperatures. If the weather is extremely hot:  Avoid vigorous physical activity.  Use air conditioning or fans or seek a cooler location.  Avoid caffeine and alcohol.  Wear loose-fitting, lightweight, and light-colored clothing.  Learn strategies to manage cold temperatures. If the weather is extremely cold:  Avoid vigorous physical activity.  Layer clothes.  Wear mittens or gloves, a hat, and a scarf when going outside.  Avoid alcohol.  Obtain ongoing education and support as needed.  Participate or seek rehabilitation as needed to maintain or improve independence and quality of life. SEEK MEDICAL CARE IF:   Your weight increases by 03 lb/1.4 kg in 1 day or 05 lb/2.3 kg in a week.  You have increasing shortness of breath that is unusual for you.  You are unable to participate in your usual physical activities.  You tire easily.  You  cough more than normal, especially with physical activity.  You have any or more swelling in areas such as your hands, feet, ankles, or abdomen.  You are unable to sleep because it is hard to breathe.  You feel like your heart is beating fast (palpitations).  You become dizzy or lightheaded upon standing up. SEEK IMMEDIATE MEDICAL CARE IF:   You have difficulty breathing.  There is a change in mental status such as decreased alertness or difficulty with concentration.  You have a pain or discomfort in your chest.  You have an episode of fainting (syncope). MAKE SURE YOU:   Understand these instructions.  Will watch your condition.  Will get help right away if you are not doing well or get worse. Document Released: 03/21/2005 Document Revised: 07/16/2012 Document Reviewed: 04/12/2012 [facility] Patient Information 2014 [place], [place].

## 2013-05-17 NOTE — Discharge Summary (Signed)
ELECTROPHYSIOLOGY PROCEDURE DISCHARGE SUMMARY    Patient ID: [name],  MRN: [id number], DOB/AGE: June 04, 1940 73 y.o.  Admit date: 05/16/2013 Discharge date: 05/17/2013  Primary Care Physician: [name], MD Primary Cardiologist: Nicholes Mango, MD Electrophysiologist: Lewayne Bunting, MD  Primary Discharge Diagnosis:  Mixed cardiomyopathy, congestive heart failure, and LBBB status post CRTD implant this admission  Secondary Discharge Diagnosis:  1.  Diabetes 2.  Hypertension 3.  Hyperlipidemia 4.  DDD 5.  CKD - stage III 6.  Prior DVT RUE 7.  CAD - medical management of occluded RCA and high grade lesion in proximal portion of small LCx  Allergies  Allergen Reactions  . Prednisone Other (See Comments)    Messes with blood sugar levels   . Dristan Cold [Chlorphen-Pe-Acetaminophen] Anxiety    Makes pt feel "wired up"     Procedures This Admission:  1.  Implantation of a STJ CRTD on 05-16-2013 by Dr Ladona Ridgel.  The patient received a STJ model number CRTD with model number 1688 right atrial lead, 7122 right ventricular lead, and 1458Q left ventricular lead.  DFT's were successful at 20 J.  There were no immediate post procedure complications. 2.  CXR on 05-17-2013 demonstrated no pneumothorax status post device implantation.   Brief HPI: [name] is a 73 y.o. male was referred to electrophysiology in the outpatient setting for consideration of CRTD implantation.  Past medical history includes mixed cardiomyopathy, congestive heart failure, and LBBB.  The patient has persistent LV dysfunction despite guideline directed therapy.  Risks, benefits, and alternatives to ICD implantation were reviewed with the patient who wished to proceed.   Hospital Course:  The patient was admitted and underwent implantation of a STJ CRTD with details as outlined above.   He was monitored on telemetry overnight which demonstrated sinus rhythm with ventricular pacing.  Left chest  was without hematoma or ecchymosis.  The device was interrogated and found to be functioning normally.  CXR was obtained and demonstrated no pneumothorax status post device implantation.  Wound care, arm mobility, and restrictions were reviewed with the patient.  Dr Ladona Ridgel examined the patient and considered them stable for discharge to home.   The patient's discharge medications include beta blocker (Carvedilol). The patient is not on an ACE-I due to renal insufficiency.   Discharge Vitals: Blood pressure 124/46, pulse 61, temperature 98.1 F (36.7 C), temperature source Oral, resp. rate 16, height 5\' 10"  (1.778 m), weight 185 lb (83.915 kg), SpO2 95.00%.    Labs:   Lab Results  Component Value Date   WBC 7.8 05/16/2013   HGB 11.9* 05/16/2013   HCT 35.1* 05/16/2013   MCV 92.9 05/16/2013   PLT 197 05/16/2013    Recent Labs Lab 05/16/13 1218  NA 143  K 5.0  CL 103  CO2 26  BUN 43*  CREATININE 1.45*  CALCIUM 10.1  GLUCOSE 79    Discharge Medications:    Medication List         acarbose 50 MG tablet  Commonly known as:  PRECOSE  Take 50 mg by mouth 3 (three) times daily with meals. Take 1 tablet 1-3 times a day     aspirin 81 MG chewable tablet  Chew 81 mg by mouth daily.     carvedilol 12.5 MG tablet  Commonly known as:  COREG  Take 12.5 mg by mouth 2 (two) times daily with a meal.     fluticasone 50 MCG/ACT nasal spray  Commonly known as:  FLONASE  Place 2 sprays into the nose daily.     furosemide 40 MG tablet  Commonly known as:  LASIX  Take 40 mg by mouth daily.     gabapentin 300 MG capsule  Commonly known as:  NEURONTIN  Take 300 mg by mouth 3 (three) times daily.     glimepiride 1 MG tablet  Commonly known as:  AMARYL  Take 1 mg by mouth 2 (two) times daily before a meal.     guaiFENesin 600 MG 12 hr tablet  Commonly known as:  MUCINEX  Take 600 mg by mouth 2 (two) times daily.     hydrALAZINE 25 MG tablet  Commonly known as:  APRESOLINE  Take 0.5  tablets (12.5 mg total) by mouth 3 (three) times daily.     HYDROcodone-acetaminophen 5-325 MG per tablet  Commonly known as:  NORCO/VICODIN  Take 1 tablet by mouth every 4 (four) hours as needed for severe pain.     isosorbide mononitrate 30 MG 24 hr tablet  Commonly known as:  IMDUR  Take 1 tablet (30 mg total) by mouth daily.     LORazepam 0.5 MG tablet  Commonly known as:  ATIVAN  Take 1 tablet (0.5 mg total) by mouth every 6 (six) hours as needed for anxiety. For anxiety     magnesium hydroxide 400 MG/5ML suspension  Commonly known as:  MILK OF MAGNESIA  Take 30 mL by mouth daily as needed for constipation.     metFORMIN 500 MG tablet  Commonly known as:  GLUCOPHAGE  Take 500 mg by mouth 2 (two) times daily with a meal.     pravastatin 40 MG tablet  Commonly known as:  PRAVACHOL  Take 1 tablet (40 mg total) by mouth at bedtime.        Disposition:       Future Appointments Provider Department Dept Phone   05/27/2013 12:00 PM [facility] [address] [facility] [phone]   06/27/2013 11:00 AM [facility] [phone]   08/20/2013 10:30 AM Marinus MawGregg W Taylor, MD [facility] [address] [facility] [phone]     Follow-up Information   [facility] [address] [facility] On 05/27/2013. (At 12:00 noon for wound check)    Specialty:  Cardiology   Contact information:   [address], [address] [place] [address] [phone]      Follow up with [facility] On 06/27/2013. (At 11:00 AM)    Specialty:  Cardiology   Contact information:   [address] [id number] [place] [address] [phone]      Follow up with Lewayne BuntingGregg Taylor, MD On 08/20/2013. (At 10:30 AM)    Specialty:  Cardiology   Contact information:   [address] [place] [address] [phone]       Duration of Discharge Encounter:  Greater than 30 minutes including physician time.  Signed,

## 2013-05-17 NOTE — Op Note (Signed)
NAME:  [name], [name]             ACCOUNT NO.:  [id number]  MEDICAL RECORD NO.:  [id number]  LOCATION:  [id number]                        FACILITY:  [facility]  PHYSICIAN:  Doylene Canning. Ladona Ridgel, MD    DATE OF BIRTH:  08-06-1940  DATE OF PROCEDURE:  05/16/2013 DATE OF DISCHARGE:                              OPERATIVE REPORT   PROCEDURE PERFORMED:  Insertion of a biventricular ICD.  INDICATION:  Mixed ischemic and nonischemic cardiomyopathy, chronic systolic heart failure ejection fraction 20% with left bundle-branch block and class II heart failure symptoms with a QRS duration of 150 milliseconds.  INTRODUCTION:  The patient is a very pleasant 73 year old man with a mixed cardiomyopathy.  His coronary artery disease is thought to be out of proportion to his severe left ventricular dysfunction with an EF of 20% left bundle-branch block.  We did a maximal medical therapy with beta-blockers and ACE inhibitors.  Despite this, his heart failure is persistently class II.  He is now referred for BiV ICD implantation secondary to all of the above.  DESCRIPTION OF PROCEDURE:  After informed consent was obtained, the patient was taken to the diagnostic EP lab in a fasting state.  After usual preparation and draping, intravenous fentanyl and midazolam was given for sedation.  A 30 mL of lidocaine was infiltrated into the left infraclavicular region.  A 7-cm incision was carried out over this region and electrocautery was utilized to dissect down to the fascial plane.  The left subclavian vein was initially unable to be punctured. A 10 mL of IV contrast was injected into the left upper extremity venous system demonstrating that the vein was displaced caudally.  The vein was then punctured x3 and the St. Jude model 7122 65-cm active fixation single coil defibrillation lead, serial #[id number] being advanced into the right ventricle.  The St. Jude model [phone] [id number] 52-cm active fixation pacing lead, serial #[id number]  [id number] was advanced to the right atrium. Mapping was carried out first in the right ventricle.  At the final site, the R-waves were 14 mV, the pacing impedance with the lead actively fixed was 670 ohms and threshold 0.9 V at 0.5 milliseconds.  A 10 V pacing did not stimulate the diaphragm.  With these satisfactory parameters, attention was then turned to placement of the atrial lead. The right atrium was mapped on the anterolateral portion of the right atrium.  The P-waves measured 3.5 mV and with lead actively fixed, the pacing impedance was 447 ohms.  Threshold was 1.3 V at 0.5 milliseconds. A 10 V pacing did not stimulate the diaphragm.  With both the atrial and the RV in satisfactory condition,  attention was then turned to placement of the left ventricular lead.  The St. Jude guiding catheter was advanced over an angioplasty guidewire into the right atrium.  A 6- [place] hexapolar EP catheter was utilized to cannulate the coronary sinus.  Venography of the coronary sinus was carried out.  This demonstrated a lateral vein which was selected for LV lead placement. An angioplastied 0.014 guidewire was then advanced into the lateral vein of  the left ventricle.  The St. Jude Quartet model [id number] 86-cm quadripolar LV pacing lead, serial [id number]  was advanced over the guidewire and into the lateral vein of the left ventricle.  Multiple vectors were satisfactory in terms of pacing and sensing and 10 V pacing did not stimulate the diaphragm.  The lead was liberated from the guiding catheter in the usual manner.  The leads were secured to the subpectoralis fascia with a figure-of-eight silk suture.  The sewing sleeves were secured with silk suture.  Electrocautery was then utilized to make a subcutaneous pocket.  Antibiotic irrigation was utilized to irrigate the pocket, and electrocautery was utilized to ensure hemostasis.  The St. Jude biventricular ICD, serial [id number] was connected to the  atrial RV and LV leads and placed back in the subcutaneous pocket.  The pocket was irrigated with antibiotic irrigation.  The incision was closed with 2-0 and 3-0 Vicryl.  At this point, I scrubbed out of the case to supervise defibrillation threshold testing.  After the patient was more deeply sedated with intravenous fentanyl and Versed under my direct supervision, ventricular fibrillation was induced with a T-wave shock.  A 20-joule shock was delivered which terminated the ventricular fibrillation and restored sinus rhythm.  No additional defibrillation threshold testing was carried out.  Benzoin and Steri- Strips were painted on the skin, and the patient was returned to the recovery area in satisfactory condition.  COMPLICATIONS:  There were no immediate procedure complications.  RESULTS:  Demonstrated successful implantation of a St. Jude biventricular ICD in a patient with a mixed ischemic and nonischemic cardiomyopathy, chronic systolic heart failure, ejection fraction 20%, left bundle-branch block despite maximal medical therapy with beta- blockers and ACE inhibitors.     Doylene CanningGregg W. Ladona Ridgelaylor, MD     GWT/MEDQ  D:  05/16/2013  T:  05/17/2013  Job:  [id number]  cc:   Marca Anconaalton McLean, MD

## 2013-05-17 NOTE — Progress Notes (Signed)
   ELECTROPHYSIOLOGY ROUNDING NOTE    Patient Name: [name] Date of Encounter: 05/17/2013    SUBJECTIVE:Patient feels well this morning.  No chest pain or shortness of breath.  S/p CRTD implant yesterday.   TELEMETRY: Reviewed telemetry pt in sinus rhythm with ventricular pacing Filed Vitals:   05/16/13 2130 05/16/13 2253 05/16/13 2330 05/17/13 0310  BP: 133/54 122/54 111/55 120/51  Pulse: 76  60 57  Temp: 97.8 F (36.6 C)  98.4 F (36.9 C) 98.1 F (36.7 C)  TempSrc: Oral   Oral  Resp: 16  18 16   Height:      Weight:      SpO2: 97%  100% 95%    Intake/Output Summary (Last 24 hours) at 05/17/13 0657 Last data filed at 05/17/13 0318  Gross per 24 hour  Intake      0 ml  Output    950 ml  Net   -950 ml    CURRENT MEDICATIONS: . acarbose  50 mg Oral TID WC  . aspirin  81 mg Oral Daily  . carvedilol  12.5 mg Oral BID WC  .  ceFAZolin (ANCEF) IV  2 g Intravenous 4 times per day  . furosemide  40 mg Oral Daily  . gabapentin  300 mg Oral TID  . glimepiride  1 mg Oral BID AC  . guaiFENesin  600 mg Oral BID  . hydrALAZINE  12.5 mg Oral TID  . isosorbide mononitrate  30 mg Oral Daily  . metFORMIN  500 mg Oral BID WC  . simvastatin  5 mg Oral q1800    LABS: Basic Metabolic Panel:  Recent Labs  55/73/22 1218  NA 143  K 5.0  CL 103  CO2 26  GLUCOSE 79  BUN 43*  CREATININE 1.45*  CALCIUM 10.1   CBC:  Recent Labs  05/16/13 1218  WBC 7.8  HGB 11.9*  HCT 35.1*  MCV 92.9  PLT 197    Radiology/Studies:  No PTX, leads in stable position.  PHYSICAL EXAM Left chest without hematoma/ecchymosis  DEVICE INTERROGATION: Device interrogation demonstrates normal device function  Active Problems:   Chronic systolic heart failure  S/P BIV ICD implant  Wound care, activity restrictions, shock plan reviewed with patient.  Routine follow up scheduled.   Leonia Reeves.D.

## 2013-05-17 NOTE — Progress Notes (Signed)
DC orders received.  Patient stable with no S/S of distress.  Medication and discharge information reviewed with patient.  Patient DC home. [name]  

## 2013-05-21 ENCOUNTER — Encounter (HOSPITAL_COMMUNITY): Payer: Self-pay | Admitting: *Deleted

## 2013-05-27 ENCOUNTER — Ambulatory Visit: Payer: Medicare Other

## 2013-06-05 ENCOUNTER — Encounter: Payer: Self-pay | Admitting: Internal Medicine

## 2013-06-17 ENCOUNTER — Ambulatory Visit: Payer: Medicare Other

## 2013-06-26 ENCOUNTER — Ambulatory Visit (INDEPENDENT_AMBULATORY_CARE_PROVIDER_SITE_OTHER): Payer: Medicare Other | Admitting: *Deleted

## 2013-06-26 DIAGNOSIS — I509 Heart failure, unspecified: Secondary | ICD-10-CM

## 2013-06-26 DIAGNOSIS — I255 Ischemic cardiomyopathy: Secondary | ICD-10-CM

## 2013-06-26 DIAGNOSIS — I5021 Acute systolic (congestive) heart failure: Secondary | ICD-10-CM

## 2013-06-26 DIAGNOSIS — I2589 Other forms of chronic ischemic heart disease: Secondary | ICD-10-CM

## 2013-06-26 LAB — MDC_IDC_ENUM_SESS_TYPE_INCLINIC
Battery Remaining Longevity: 52.8 mo
Brady Statistic RA Percent Paced: 8.8 %
Brady Statistic RV Percent Paced: 99.08 %
HighPow Impedance: 54 Ohm
Implantable Pulse Generator Serial Number: 7170834
Lead Channel Impedance Value: 437.5 Ohm
Lead Channel Pacing Threshold Pulse Width: 0.5 ms
Lead Channel Pacing Threshold Pulse Width: 0.5 ms
Lead Channel Pacing Threshold Pulse Width: 0.5 ms
Lead Channel Sensing Intrinsic Amplitude: 12 mV
Lead Channel Sensing Intrinsic Amplitude: 2.1 mV
Lead Channel Setting Pacing Amplitude: 3.5 V
Lead Channel Setting Pacing Pulse Width: 0.5 ms
MDC IDC MSMT LEADCHNL LV IMPEDANCE VALUE: 612.5 Ohm
MDC IDC MSMT LEADCHNL LV PACING THRESHOLD AMPLITUDE: 1.5 V
MDC IDC MSMT LEADCHNL RA IMPEDANCE VALUE: 450 Ohm
MDC IDC MSMT LEADCHNL RA PACING THRESHOLD AMPLITUDE: 0.75 V
MDC IDC MSMT LEADCHNL RV PACING THRESHOLD AMPLITUDE: 1 V
MDC IDC SESS DTM: 20150325161303
MDC IDC SET LEADCHNL RA PACING AMPLITUDE: 3.5 V
MDC IDC SET LEADCHNL RV PACING AMPLITUDE: 3.5 V
MDC IDC SET LEADCHNL RV PACING PULSEWIDTH: 0.5 ms
MDC IDC SET LEADCHNL RV SENSING SENSITIVITY: 0.5 mV
MDC IDC SET ZONE DETECTION INTERVAL: 250 ms
Zone Setting Detection Interval: 280 ms
Zone Setting Detection Interval: 340 ms

## 2013-06-26 NOTE — Progress Notes (Signed)

## 2013-06-27 ENCOUNTER — Encounter (HOSPITAL_COMMUNITY): Payer: Medicare Other

## 2013-07-02 ENCOUNTER — Encounter: Payer: Self-pay | Admitting: Internal Medicine

## 2013-07-03 ENCOUNTER — Ambulatory Visit (HOSPITAL_COMMUNITY)
Admission: RE | Admit: 2013-07-03 | Discharge: 2013-07-03 | Disposition: A | Discharge status: Home / Self Care | Payer: Medicare Other | Source: Ambulatory Visit | Attending: Cardiology | Admitting: Cardiology

## 2013-07-03 ENCOUNTER — Encounter (HOSPITAL_COMMUNITY): Payer: Self-pay

## 2013-07-03 ENCOUNTER — Ambulatory Visit (INDEPENDENT_AMBULATORY_CARE_PROVIDER_SITE_OTHER): Payer: Medicare Other | Admitting: *Deleted

## 2013-07-03 VITALS — BP 134/70 | HR 83 | Wt 183.4 lb

## 2013-07-03 DIAGNOSIS — Z95810 Presence of automatic (implantable) cardiac defibrillator: Secondary | ICD-10-CM | POA: Insufficient documentation

## 2013-07-03 DIAGNOSIS — I5022 Chronic systolic (congestive) heart failure: Secondary | ICD-10-CM

## 2013-07-03 DIAGNOSIS — I428 Other cardiomyopathies: Secondary | ICD-10-CM

## 2013-07-03 DIAGNOSIS — I2510 Atherosclerotic heart disease of native coronary artery without angina pectoris: Secondary | ICD-10-CM

## 2013-07-03 LAB — BASIC METABOLIC PANEL
BUN: 47 mg/dL — AB (ref 6–23)
CHLORIDE: 99 meq/L (ref 96–112)
CO2: 28 mEq/L (ref 19–32)
Calcium: 9.9 mg/dL (ref 8.4–10.5)
Creatinine, Ser: 1.57 mg/dL — ABNORMAL HIGH (ref 0.50–1.35)
GFR calc Af Amer: 49 mL/min — ABNORMAL LOW (ref >90)
GFR, EST NON AFRICAN AMERICAN: 42 mL/min — AB (ref >90)
GLUCOSE: 115 mg/dL — AB (ref 70–99)
POTASSIUM: 5.1 meq/L (ref 3.7–5.3)
Sodium: 140 mEq/L (ref 137–147)

## 2013-07-03 MED ORDER — CEPHALEXIN 500 MG PO CAPS
500.0000 mg | ORAL_CAPSULE | Freq: Two times a day (BID) | ORAL | Status: AC
Start: 2013-07-03 — End: 2013-07-10

## 2013-07-03 NOTE — Progress Notes (Signed)
Patient ID: Geraldine ContrasHoward S Grandberry, male   DOB: 10/24/1940, 73 y.o.   MRN: [id number]   Weight Range 196-199 pounds  Baseline proBNP    PCP: Dr Loleta ChanceHill EP: Dr Ladona Ridgelaylor  HPI: [name] is a 73 yo male with history of DM, HTN, hyperlipidemia, CKD stage III (Creatinine 1.7) and DVT (RUE). Diagnosed with systolic HF in 7/14 with EF 20%. Found to have CAD with totally occluded RCA and high-grade lesion in proximal portion of small LCX. LV dysfunction felt to be out of proportion to CAD. Managed medically.   While in the hospital had intermittent atrial flutter while in the hospital thought to be from Milrinone. Diuresed with lasix and milrinone. Discharge weight 196 pounds.   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 The RCA is occluded with collaterals and the proximal LCx has a tight stenosis.  ECHO 03/19/13: EF 20%, mild AS  He returns for follow up. Last visit he was started on hydralazine 12.5 mg three times a day however he is not sure he is taking it. Denies PND/Orthopnea. No shocks.  Mild dyspnea with exertion. Has to take his time going up steps.  Complaining of erythema at L upper chest ICD scar. He noticed redness over the last few days. He is not weighing at home. Drinking less than 2 liters a day and following a low salt diet.   11/20/12 Creatinine 1.5 Potassium 4.1 11/27/12 Creatinine 1.39 Potassium 3.9 01/09/13 Creatinine 2.05, KCL 5.5, pro-BNP 1187, cholesterol 121, TG 72, HDL 29, LDL 78 01/15/13 K+ 5.4, creatinine 1.7 01/24/13: K+ 4.7, creatinine 1.79, proBNP 2103 05/16/13 K 5.0 Creatinine 1.45  07/03/13 K 5.1 Creatinine 1.57    SH: Married, lives at home with his wife.  Nonsmoker.  FH:  CAD  ROS: All systems negative except as listed in HPI, PMH and Problem List.  Past Medical  History  Diagnosis Date  . Hypertension   . Hyperlipidemia   . Cervical spinal stenosis   . Lumbar spinal stenosis   . 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 DVT (deep venous thrombosis) 10/2011    RUE; pt denies this hx on 05/16/2013  . Anemia   . Cardiomyopathy     a. 10/2012 Echo: EF 20-25%, diff HK, mod dil LA/RV/RA.  Marland Kitchen. CAD (coronary artery disease)   . Atrial flutter   . CKD (chronic kidney disease), stage III   . Venous insufficiency   . Frequent falls   . 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 Chronic systolic congestive heart failure   . Quadriparesis   . Hypoglycemia   . Cervical myelopathy   . Acute encephalopathy   . Mononeuritis of unspecified site   . Dysrhythmia   . Pneumonia 1960    "once"  . Type II diabetes mellitus   . H/O hiatal hernia   . Arthritis     "all over" (05/16/2013)  . Depression     "son died in service in 1992; still bothers me" (05/16/2013)    Current Outpatient Prescriptions  Medication Sig Dispense Refill  . acarbose (PRECOSE) 50 MG tablet Take 50 mg by mouth 3 (three) times daily with meals. Take 1 tablet 1-3 times a  day      . aspirin 81 MG chewable tablet Chew 81 mg by mouth daily.      . carvedilol (COREG) 12.5 MG tablet Take 12.5 mg by mouth 2 (two) times daily with a meal.      . fluticasone (FLONASE) 50 MCG/ACT nasal spray Place 2 sprays into the nose daily.      . furosemide (LASIX) 40 MG tablet Take 40 mg by mouth daily.       Marland Kitchen gabapentin (NEURONTIN) 300 MG capsule Take 300 mg by mouth 3 (three) times daily.      Marland Kitchen glimepiride (AMARYL) 1 MG tablet Take 1 mg by mouth 2 (two) times daily before a meal.      . guaiFENesin (MUCINEX) 600 MG 12 hr tablet Take 600 mg by mouth 2 (two) times daily as needed.       . hydrALAZINE (APRESOLINE) 25 MG tablet Take 0.5 tablets (12.5 mg total) by mouth 3 (three) times daily.  45 tablet  3  . HYDROcodone-acetaminophen (NORCO/VICODIN) 5-325 MG per tablet Take  1 tablet by mouth every 4 (four) hours as needed for severe pain.      . isosorbide mononitrate (IMDUR) 30 MG 24 hr tablet Take 1 tablet (30 mg total) by mouth daily.  30 tablet  3  . LORazepam (ATIVAN) 0.5 MG tablet Take 1 tablet (0.5 mg total) by mouth every 6 (six) hours as needed for anxiety. For anxiety  15 tablet  0  . magnesium hydroxide (MILK OF MAGNESIA) 400 MG/5ML suspension Take 30 mL by mouth daily as needed for constipation.      . metFORMIN (GLUCOPHAGE) 500 MG tablet Take 500 mg by mouth 2 (two) times daily with a meal.       . pravastatin (PRAVACHOL) 40 MG tablet Take 1 tablet (40 mg total) by mouth at bedtime.  30 tablet  3   No current facility-administered medications for this encounter.   Filed Vitals:   07/03/13 1505  BP: 134/70  Pulse: 83  Weight: 183 lb 6.4 oz (83.19 kg)  SpO2: 98%    PHYSICAL EXAM: General: Elderly. No resp difficulty, uses walker and cane HEENT: normal Neck: supple. JVP 5-6  Carotids 2+ bilaterally; no bruits. No lymphadenopathy or thryomegaly appreciated. Cor: PMI normal. Distant heart sounds. Regular rate & rhythm. No rubs or gallops +SEM RSB L upper chest scar from ICD medial aspect with erythema noted.   Lungs: clear Abdomen: soft, nontender, nondistended. No hepatosplenomegaly. No bruits or masses. Good bowel sounds. Extremities: no cyanosis, clubbing, rash.  No lower extremity edema   Neuro: alert & orientedx3, cranial nerves grossly intact. Moves all 4 extremities w/o difficulty. Affect pleasant.   Assessment/Plan: 1. Chronic systolic CHF: ICM S/P ST  Jude BiVi ICD  05/17/13 Ischemic CMP with chronic LBBB, EF 20-25% (03/2013). - NYHA II-III symptoms. Volume status stable. Continue lasix 40 mg daily.   - Continue coreg 12.5 mg BID. He is not on an ACE-I or Spiro with CRI. - Continue hydralazine 12.5 mg TID and IMDUR 30 mg daily for afterload reduction.  -I am not going to up titrate his medications because he is unsure of his medications.  I have asked him to bring all medications to his next appointment.  -Check BMET K 5.1 Creatinine 1.57  - Reinforced the need and importance of daily weights, a low sodium diet, and fluid restriction (less than 2 L a day). Instructed to call the HF clinic if weight increases more  than 3 lbs overnight or 5 lbs in a week.  2. CAD:LHC at last 12/2012 showed occluded RCA (chronic) with 90% stenosis in small LCx.  No chest pain.  It was decided to avoid PCI to LCx as the vessel was small, he had no chest pain and PCI was unlikely to help LV function significantly, and PCI could compromise the major OM.  No evidence ischemia.  - Continue ASA, statin and BB   4. Chronic kidney disease, Stage 3-4: Check BMET ---> Creatinine 1.4> 1.5  5.. S/P BiVi St Jude 05/17/13. - Erythema noted medial aspect of incision that has been present for the few days.  Discussed with EP and he be evaluated today by EP.   Follow up in 6 weeks.   CLEGG,AMY NP-C 07/03/2013

## 2013-07-03 NOTE — Patient Instructions (Addendum)
Follow up in 6 weeks  Please bring all medications to your next visit.    Do the following things EVERYDAY: 1) Weigh yourself in the morning before breakfast. Write it down and keep it in a log. 2) Take your medicines as prescribed 3) Eat low salt foods-Limit salt (sodium) to 2000 mg per day.  4) Stay as active as you can everyday 5) Limit all fluids for the day to less than 2 liters

## 2013-07-03 NOTE — Progress Notes (Signed)
Pt in for wound check due to red area at site. Small stitch removed by Dr Johney Frame. Per JA start Kelfex 500 mg bid x 7 days and check on 07-10-13 for GT to check site.

## 2013-07-10 ENCOUNTER — Ambulatory Visit (INDEPENDENT_AMBULATORY_CARE_PROVIDER_SITE_OTHER): Payer: Medicare Other | Admitting: *Deleted

## 2013-07-10 DIAGNOSIS — I428 Other cardiomyopathies: Secondary | ICD-10-CM

## 2013-07-10 NOTE — Progress Notes (Signed)
Pt seen for wound check only. Stitch removed on 07-03-13 by JA and started on Keflex. Dr Ladona Ridgel evaluated---healing normal.

## 2013-07-25 ENCOUNTER — Other Ambulatory Visit (HOSPITAL_COMMUNITY): Payer: Self-pay | Admitting: Anesthesiology

## 2013-08-14 ENCOUNTER — Inpatient Hospital Stay (HOSPITAL_COMMUNITY): Admission: RE | Admit: 2013-08-14 | Payer: Medicare Other | Source: Ambulatory Visit

## 2013-08-20 ENCOUNTER — Encounter: Payer: Medicare Other | Admitting: Internal Medicine

## 2013-08-26 ENCOUNTER — Other Ambulatory Visit (HOSPITAL_COMMUNITY): Payer: Self-pay | Admitting: Anesthesiology

## 2013-09-25 ENCOUNTER — Ambulatory Visit (HOSPITAL_COMMUNITY)
Admission: RE | Admit: 2013-09-25 | Discharge: 2013-09-25 | Disposition: A | Discharge status: Home / Self Care | Payer: Medicare Other | Source: Ambulatory Visit | Attending: Internal Medicine | Admitting: Internal Medicine

## 2013-09-25 ENCOUNTER — Encounter (HOSPITAL_COMMUNITY): Payer: Self-pay

## 2013-09-25 VITALS — BP 115/59 | HR 74 | Resp 18 | Wt 183.5 lb

## 2013-09-25 DIAGNOSIS — I5022 Chronic systolic (congestive) heart failure: Secondary | ICD-10-CM | POA: Insufficient documentation

## 2013-09-25 DIAGNOSIS — I2510 Atherosclerotic heart disease of native coronary artery without angina pectoris: Secondary | ICD-10-CM | POA: Insufficient documentation

## 2013-09-25 DIAGNOSIS — Z95810 Presence of automatic (implantable) cardiac defibrillator: Secondary | ICD-10-CM

## 2013-09-25 DIAGNOSIS — I129 Hypertensive chronic kidney disease with stage 1 through stage 4 chronic kidney disease, or unspecified chronic kidney disease: Secondary | ICD-10-CM | POA: Insufficient documentation

## 2013-09-25 DIAGNOSIS — N183 Chronic kidney disease, stage 3 unspecified: Secondary | ICD-10-CM | POA: Insufficient documentation

## 2013-09-25 DIAGNOSIS — E785 Hyperlipidemia, unspecified: Secondary | ICD-10-CM | POA: Insufficient documentation

## 2013-09-25 DIAGNOSIS — E119 Type 2 diabetes mellitus without complications: Secondary | ICD-10-CM | POA: Insufficient documentation

## 2013-09-25 DIAGNOSIS — I509 Heart failure, unspecified: Secondary | ICD-10-CM | POA: Insufficient documentation

## 2013-09-25 DIAGNOSIS — Z86718 Personal history of other venous thrombosis and embolism: Secondary | ICD-10-CM | POA: Insufficient documentation

## 2013-09-25 DIAGNOSIS — Z7982 Long term (current) use of aspirin: Secondary | ICD-10-CM | POA: Insufficient documentation

## 2013-09-25 MED ORDER — CARVEDILOL 12.5 MG PO TABS: 18.7500 mg | ORAL_TABLET | Freq: Two times a day (BID) | ORAL | Status: DC

## 2013-09-25 NOTE — Patient Instructions (Signed)
Follow up in 3 months  Take carvedilol 18.75 mg twice a day this is 1 1/2 tablets twice a day  Do the following things EVERYDAY: 1) Weigh yourself in the morning before breakfast. Write it down and keep it in a log. 2) Take your medicines as prescribed 3) Eat low salt foods-Limit salt (sodium) to 2000 mg per day.  4) Stay as active as you can everyday 5) Limit all fluids for the day to less than 2 liters

## 2013-09-25 NOTE — Progress Notes (Signed)
Patient ID: [name], male   DOB: Jun 27, 1940, 73 y.o.   MRN: [id number]   Weight Range 196-199 pounds  Baseline proBNP    PCP: Dr Loleta Chance EP: Dr Ladona Ridgel  HPI: [name] is a 73 yo male with history of DM, HTN, hyperlipidemia, CKD stage III (Creatinine 1.7) and DVT (RUE). Diagnosed with systolic HF in 7/14 with EF 20%. Found to have CAD with totally occluded RCA and high-grade lesion in proximal portion of small LCX. LV dysfunction felt to be out of proportion to CAD. Managed medically.   While in the hospital had intermittent atrial flutter while in the hospital thought to be from Milrinone. Diuresed with lasix and milrinone. Discharge weight 196 pounds.   He returns for follow up. Overall feels good. Denies SOB/PND/Orthopnea. Does admit to mild dizziness standing every now an then. Not weighing daily. Taking all medications. Ambulates with walker. Drinking less than 2 liters a day. Not following low salt diet.   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 The RCA is occluded with collaterals and the proximal LCx has a tight stenosis.  ECHO 03/19/13: EF 20%, mild AS  11/20/12 Creatinine 1.5 Potassium 4.1 11/27/12 Creatinine 1.39 Potassium 3.9 01/09/13 Creatinine 2.05, KCL 5.5, pro-BNP 1187, cholesterol 121, TG 72, HDL 29, LDL 78 01/15/13 K+ 5.4, creatinine 1.7 01/24/13: K+ 4.7, creatinine 1.79, proBNP Aug 13, 2101 May 19, 2013 K 5.0 Creatinine 1.45  07/03/13 K 5.1 Creatinine 1.57    SH: Married, lives at home with his wife.  Nonsmoker.  FH:  CAD  ROS: All systems negative except as listed in HPI, PMH and Problem List.  Past Medical History  Diagnosis Date  . Hypertension   . Hyperlipidemia   . Cervical spinal stenosis   . Lumbar spinal stenosis   . Complex regional pain syndrome of right  upper extremity   . DVT (deep venous thrombosis) 10/2011    RUE; pt denies this hx on 2013-05-19  . Anemia   . Cardiomyopathy     a. 10/2012 Echo: EF 20-25%, diff HK, mod dil LA/RV/RA.  Marland Kitchen CAD (coronary artery disease)   . Atrial flutter   . CKD (chronic kidney disease), stage III   . Venous insufficiency   . Frequent falls   . 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 Chronic systolic congestive heart failure   . Quadriparesis   . Hypoglycemia   . Cervical myelopathy   . Acute encephalopathy   . Mononeuritis of unspecified site   . Dysrhythmia   . Pneumonia 1960    "once"  . Type II diabetes mellitus   . H/O hiatal hernia   . Arthritis     "all over" (May 19, 2013)  . Depression     "son died in service in 08/14/1990; still bothers me" (05-19-2013)    Current Outpatient Prescriptions  Medication Sig Dispense Refill  . acarbose (PRECOSE) 50 MG tablet Take 50 mg by mouth 3 (three) times daily with meals. Take 1 tablet 1-3 times a day      . aspirin 81 MG chewable tablet Chew 81 mg by mouth daily.      . carvedilol (COREG) 12.5 MG tablet Take 12.5 mg by mouth 2 (two) times daily with a  meal.      . fluticasone (FLONASE) 50 MCG/ACT nasal spray Place 2 sprays into the nose daily.      . furosemide (LASIX) 40 MG tablet Take 40 mg by mouth daily.       Marland Kitchen gabapentin (NEURONTIN) 300 MG capsule Take 300 mg by mouth 3 (three) times daily.      Marland Kitchen glimepiride (AMARYL) 1 MG tablet Take 1 mg by mouth 2 (two) times daily before a meal.      . guaiFENesin (MUCINEX) 600 MG 12 hr tablet Take 600 mg by mouth 2 (two) times daily as needed.       . hydrALAZINE (APRESOLINE) 25 MG tablet TAKE ONE-HALF TABLET BY MOUTH THREE TIMES DAILY  45 tablet  3  . HYDROcodone-acetaminophen (NORCO/VICODIN) 5-325 MG per tablet Take 1 tablet by mouth every 4 (four) hours as needed for severe pain.      . isosorbide mononitrate (IMDUR) 30 MG 24 hr tablet TAKE ONE TABLET BY MOUTH ONCE DAILY  30 tablet  3  .  LORazepam (ATIVAN) 0.5 MG tablet Take 1 tablet (0.5 mg total) by mouth every 6 (six) hours as needed for anxiety. For anxiety  15 tablet  0  . magnesium hydroxide (MILK OF MAGNESIA) 400 MG/5ML suspension Take 30 mL by mouth daily as needed for constipation.      . metFORMIN (GLUCOPHAGE) 500 MG tablet Take 500 mg by mouth 2 (two) times daily with a meal.       . pravastatin (PRAVACHOL) 40 MG tablet Take 1 tablet (40 mg total) by mouth at bedtime.  30 tablet  3   No current facility-administered medications for this encounter.   Filed Vitals:   09/25/13 1453  BP: 115/59  Pulse: 74  Resp: 18  Weight: 183 lb 8 oz (83.235 kg)  SpO2: 99%    PHYSICAL EXAM: General: Elderly. No resp difficulty, uses walker and cane. Wife present  HEENT: normal Neck: supple. JVP 5-6  Carotids 2+ bilaterally; no bruits. No lymphadenopathy or thryomegaly appreciated. Cor: PMI normal. Distant heart sounds. Regular rate & rhythm. No rubs or gallops +SEM RSB L upper chest scar from ICD.    Lungs: clear Abdomen: soft, nontender, nondistended. No hepatosplenomegaly. No bruits or masses. Good bowel sounds. Extremities: no cyanosis, clubbing, rash.  No lower extremity edema   Neuro: alert & orientedx3, cranial nerves grossly intact. Moves all 4 extremities w/o difficulty. Affect pleasant.   Assessment/Plan: 1. Chronic systolic CHF: ICM S/P ST  Jude BiVi ICD  05/17/13 Ischemic CMP with chronic LBBB, EF 20-25% (03/2013). - NYHA II-III symptoms. Volume status stable. Continue lasix 40 mg daily.   -Increase carvedilol to 18.75 mg twice a day.  He is not on an ACE-I or Spiro with CRI. - Continue hydralazine 12.5 mg TID and IMDUR 30 mg daily for afterload reduction.  - Reinforced the need and importance of daily weights, a low sodium diet, and fluid restriction (less than 2 L a day). Instructed to call the HF clinic if weight increases more than 3 lbs overnight or 5 lbs in a week.  2. CAD:LHC at last 12/2012 showed occluded  RCA (chronic) with 90% stenosis in small LCx.  No chest pain.  It was decided to avoid PCI to LCx as the vessel was small, he had no chest pain and PCI was unlikely to help LV function significantly, and PCI could compromise the major OM.  No evidence ischemia.  - Continue ASA, statin and BB  4. Chronic kidney disease, Stage 3-4: Most recent creatinine 1..5  5.. S/P BiVi St Jude 05/17/13. Per Dr Ladona Ridgelaylor   Follow up in 3 months Check ECHO in December   CLEGG,AMY NP-C 09/25/2013

## 2013-11-08 ENCOUNTER — Other Ambulatory Visit (HOSPITAL_COMMUNITY): Payer: Self-pay | Admitting: Internal Medicine

## 2013-11-19 ENCOUNTER — Encounter: Payer: Self-pay | Admitting: Internal Medicine

## 2013-11-19 ENCOUNTER — Ambulatory Visit (INDEPENDENT_AMBULATORY_CARE_PROVIDER_SITE_OTHER): Payer: Medicare Other | Admitting: Internal Medicine

## 2013-11-19 VITALS — BP 137/70 | HR 80 | Ht 69.0 in | Wt 184.0 lb

## 2013-11-19 DIAGNOSIS — I5022 Chronic systolic (congestive) heart failure: Secondary | ICD-10-CM

## 2013-11-19 DIAGNOSIS — I255 Ischemic cardiomyopathy: Secondary | ICD-10-CM

## 2013-11-19 DIAGNOSIS — I5021 Acute systolic (congestive) heart failure: Secondary | ICD-10-CM

## 2013-11-19 DIAGNOSIS — I509 Heart failure, unspecified: Secondary | ICD-10-CM

## 2013-11-19 DIAGNOSIS — Z95810 Presence of automatic (implantable) cardiac defibrillator: Secondary | ICD-10-CM

## 2013-11-19 DIAGNOSIS — I4892 Unspecified atrial flutter: Secondary | ICD-10-CM

## 2013-11-19 DIAGNOSIS — I2589 Other forms of chronic ischemic heart disease: Secondary | ICD-10-CM

## 2013-11-19 NOTE — Assessment & Plan Note (Signed)
His device is working normally. We'll plan to recheck in several months. 

## 2013-11-19 NOTE — Patient Instructions (Signed)
Remote monitoring is used to monitor your Pacemaker of ICD from home. This monitoring reduces the number of office visits required to check your device to one time per year. It allows [place] to keep an eye on the functioning of your device to ensure it is working properly. You are scheduled for a device check from home on 02/18/2014. You may send your transmission at any time that day. If you have a wireless device, the transmission will be sent automatically. After your physician reviews your transmission, you will receive a postcard with your next transmission date.  Your physician wants you to follow-up in: 9 MONTHS WITH DR Ladona Ridgel.   You will receive a reminder letter in the mail two months in advance. If you don't receive a letter, please call our office to schedule the follow-up appointment.

## 2013-11-19 NOTE — Assessment & Plan Note (Signed)
His symptoms are class II. He will continue his current medical therapy. He will maintain a low-sodium diet. He is limited by his arthritis.

## 2013-11-19 NOTE — Progress Notes (Signed)
HPI [name] returns today for ongoing ICD management. He is a pleasant retired Doctor, hospitalblacksmith with a h/o longstanding CHF, HTN, and severe arthritis. He has LBBB and class 3 CHF symptoms and an EF of 20% which has been longstanding despite maximal medical therapy. He has chronic peripheral edema. He has not had syncope. He is limited in his activity by severe back pain. He does have CAD but it is thought that the severity of his CAD is not commensurate with the degree of his LV dysfunction. He underwent biventricular ICD implantation approximately 6 months ago. His dyspnea is improved, but he is limited by his severe arthritis. He has minimal peripheral edema. No ICD shock. No syncope. Allergies  Allergen Reactions  . Prednisone Other (See Comments)    Messes with blood sugar levels   . Dristan Cold [Chlorphen-Pe-Acetaminophen] Anxiety    Makes pt feel "wired up"     Current Outpatient Prescriptions  Medication Sig Dispense Refill  . acarbose (PRECOSE) 50 MG tablet Take 50 mg by mouth 3 (three) times daily with meals. Take 1 tablet 1-3 times a day      . aspirin 81 MG chewable tablet Chew 81 mg by mouth daily.      . carvedilol (COREG) 12.5 MG tablet Take 1.5 tablets (18.75 mg total) by mouth 2 (two) times daily with a meal.  90 tablet  6  . fluticasone (FLONASE) 50 MCG/ACT nasal spray Place 2 sprays into the nose daily.      . furosemide (LASIX) 40 MG tablet Take 40 mg by mouth daily.       Marland Kitchen. gabapentin (NEURONTIN) 300 MG capsule Take 300 mg by mouth 3 (three) times daily.      Marland Kitchen. glimepiride (AMARYL) 1 MG tablet Take 1 mg by mouth 2 (two) times daily before a meal.      . guaiFENesin (MUCINEX) 600 MG 12 hr tablet Take 600 mg by mouth 2 (two) times daily as needed.       . hydrALAZINE (APRESOLINE) 25 MG tablet TAKE ONE-HALF TABLET BY MOUTH THREE TIMES DAILY  45 tablet  3  . HYDROcodone-acetaminophen (NORCO/VICODIN) 5-325 MG per tablet Take 1 tablet by mouth every 4 (four) hours as  needed for severe pain.      . isosorbide mononitrate (IMDUR) 30 MG 24 hr tablet TAKE ONE TABLET BY MOUTH ONCE DAILY  30 tablet  0  . LORazepam (ATIVAN) 0.5 MG tablet Take 1 tablet (0.5 mg total) by mouth every 6 (six) hours as needed for anxiety. For anxiety  15 tablet  0  . magnesium hydroxide (MILK OF MAGNESIA) 400 MG/5ML suspension Take 30 mL by mouth daily as needed for constipation.      . metFORMIN (GLUCOPHAGE) 500 MG tablet Take 500 mg by mouth 2 (two) times daily with a meal.       . pravastatin (PRAVACHOL) 40 MG tablet Take 1 tablet (40 mg total) by mouth at bedtime.  30 tablet  3  . tamsulosin (FLOMAX) 0.4 MG CAPS capsule        No current facility-administered medications for this visit.     Past Medical History  Diagnosis Date  . Hypertension   . Hyperlipidemia   . Cervical spinal stenosis   . Lumbar spinal stenosis   . 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 DVT (deep venous thrombosis) 10/2011    RUE; pt denies this hx on 05/16/2013  . Anemia   .  Cardiomyopathy     a. 10/2012 Echo: EF 20-25%, diff HK, mod dil LA/RV/RA.  Marland Kitchen CAD (coronary artery disease)   . Atrial flutter   . CKD (chronic kidney disease), stage III   . Venous insufficiency   . Frequent falls   . 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 Chronic systolic congestive heart failure   . Quadriparesis   . Hypoglycemia   . Cervical myelopathy   . Acute encephalopathy   . Mononeuritis of unspecified site   . Dysrhythmia   . Pneumonia 1960    "once"  . Type II diabetes mellitus   . H/O hiatal hernia   . Arthritis     "all over" (05-23-2013)  . Depression     "son died in service in 08/01/90; still bothers me" (May 23, 2013)    ROS:   All systems reviewed and negative except as noted in the HPI.   Past Surgical History  Procedure Laterality Date  . Total hip arthroplasty Right ~ Aug 01, 2010  . 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 Anterior  cervical decomp/discectomy fusion  10/31/2011    Procedure: ANTERIOR CERVICAL DECOMPRESSION/DISCECTOMY FUSION 2 LEVELS;  Surgeon: Jason Loron, MD;  Location: [facility];  Service: Neurosurgery;  Laterality: N/A;  Cervical Four-Five Cervical Five-Six Anterior Cervical Decompression with fusion interbody prothesis plating and bonegraft  . Carpal tunnel release Right ~ 2011/08/01  . Bi-ventricular implantable cardioverter defibrillator  (crt-d) Left 05-23-2013    STJ CRTD implanted by [name] for primary prevention/CHF  . Cataract extraction w/ intraocular lens implant Bilateral   . Cardiac catheterization  10/2012     Family History  Problem Relation Age of Onset  . Diabetes Brother   . Cancer Father     Lung cancer     History   Social History  . Marital Status: Married    Spouse Name: N/A    Number of Children: N/A  . Years of Education: N/A   Occupational History  . Not on file.   Social History Main Topics  . Smoking status: Never Smoker   . Smokeless tobacco: Never Used  . Alcohol Use: Yes     Comment: 05-23-13 "used to drink a little bit a long time ago; nothing in over 30 yrs"  . Drug Use: No  . Sexual Activity: Not Currently   Other Topics Concern  . Not on file   Social History Narrative  . No narrative on file     BP 137/70  Pulse 80  Ht 5\' 9"  (1.753 m)  Wt 184 lb (83.462 kg)  BMI 27.16 kg/m2  Physical Exam:  Chronically ill appearing 73 yo man, in a wheel chair, NAD HEENT: Unremarkable Neck:  No JVD, no thyromegally Back:  No CVA tenderness Lungs:  Clear with no wheezes HEART:  Regular rate rhythm, no murmurs, no rubs, no clicks Abd:  soft, positive bowel sounds, no organomegally, no rebound, no guarding Ext:  2 plus pulses, no edema, no cyanosis, no clubbing Skin:  No rashes no nodules Neuro:  CN II through XII intact, motor grossly intact  ICD interrogation - normal device function.  Assess/Plan:

## 2013-11-20 ENCOUNTER — Other Ambulatory Visit (HOSPITAL_COMMUNITY): Payer: Self-pay | Admitting: Internal Medicine

## 2013-11-20 ENCOUNTER — Encounter: Payer: Self-pay | Admitting: Internal Medicine

## 2013-11-20 LAB — MDC_IDC_ENUM_SESS_TYPE_INCLINIC
Battery Remaining Longevity: 57.6 mo
Brady Statistic RA Percent Paced: 14 %
Brady Statistic RV Percent Paced: 99 %
Date Time Interrogation Session: 20150818204337
HighPow Impedance: 67.5 Ohm
Implantable Pulse Generator Serial Number: 7170834
Lead Channel Impedance Value: 437.5 Ohm
Lead Channel Impedance Value: 437.5 Ohm
Lead Channel Impedance Value: 637.5 Ohm
Lead Channel Pacing Threshold Amplitude: 0.75 V
Lead Channel Pacing Threshold Amplitude: 0.75 V
Lead Channel Pacing Threshold Amplitude: 0.75 V
Lead Channel Pacing Threshold Amplitude: 1 V
Lead Channel Pacing Threshold Amplitude: 1.75 V
Lead Channel Pacing Threshold Amplitude: 1.75 V
Lead Channel Pacing Threshold Pulse Width: 0.5 ms
Lead Channel Pacing Threshold Pulse Width: 0.5 ms
Lead Channel Pacing Threshold Pulse Width: 0.5 ms
Lead Channel Pacing Threshold Pulse Width: 0.5 ms
Lead Channel Pacing Threshold Pulse Width: 0.8 ms
Lead Channel Pacing Threshold Pulse Width: 0.8 ms
Lead Channel Sensing Intrinsic Amplitude: 11.9 mV
Lead Channel Sensing Intrinsic Amplitude: 2.8 mV
Lead Channel Setting Pacing Amplitude: 2 V
Lead Channel Setting Pacing Amplitude: 2 V
Lead Channel Setting Pacing Amplitude: 3 V
Lead Channel Setting Pacing Pulse Width: 0.5 ms
Lead Channel Setting Pacing Pulse Width: 0.8 ms
Lead Channel Setting Sensing Sensitivity: 0.5 mV
Zone Setting Detection Interval: 250 ms
Zone Setting Detection Interval: 280 ms
Zone Setting Detection Interval: 340 ms

## 2013-11-27 ENCOUNTER — Encounter: Payer: Self-pay | Admitting: Internal Medicine

## 2013-12-03 ENCOUNTER — Other Ambulatory Visit: Payer: Self-pay | Admitting: Internal Medicine

## 2013-12-22 ENCOUNTER — Other Ambulatory Visit (HOSPITAL_COMMUNITY): Payer: Self-pay | Admitting: Anesthesiology

## 2013-12-24 ENCOUNTER — Encounter (HOSPITAL_COMMUNITY): Payer: Medicare Other

## 2014-01-01 ENCOUNTER — Encounter (HOSPITAL_COMMUNITY): Payer: Self-pay

## 2014-01-01 ENCOUNTER — Ambulatory Visit (HOSPITAL_COMMUNITY)
Admission: RE | Admit: 2014-01-01 | Discharge: 2014-01-01 | Disposition: A | Discharge status: Home / Self Care | Payer: Medicare Other | Source: Ambulatory Visit | Attending: Cardiology | Admitting: Cardiology

## 2014-01-01 VITALS — BP 116/72 | HR 95 | Wt 184.8 lb

## 2014-01-01 DIAGNOSIS — I2510 Atherosclerotic heart disease of native coronary artery without angina pectoris: Secondary | ICD-10-CM | POA: Diagnosis not present

## 2014-01-01 DIAGNOSIS — N183 Chronic kidney disease, stage 3 unspecified: Secondary | ICD-10-CM | POA: Diagnosis not present

## 2014-01-01 DIAGNOSIS — I509 Heart failure, unspecified: Secondary | ICD-10-CM | POA: Diagnosis not present

## 2014-01-01 DIAGNOSIS — E785 Hyperlipidemia, unspecified: Secondary | ICD-10-CM

## 2014-01-01 DIAGNOSIS — I5022 Chronic systolic (congestive) heart failure: Secondary | ICD-10-CM | POA: Diagnosis present

## 2014-01-01 LAB — LIPID PANEL
CHOLESTEROL: 115 mg/dL (ref 0–200)
HDL: 23 mg/dL — ABNORMAL LOW (ref >39)
LDL Cholesterol: 72 mg/dL (ref 0–99)
Total CHOL/HDL Ratio: 5 RATIO
Triglycerides: 102 mg/dL (ref <150)
VLDL: 20 mg/dL (ref 0–40)

## 2014-01-01 LAB — BASIC METABOLIC PANEL
Anion gap: 13 (ref 5–15)
BUN: 29 mg/dL — ABNORMAL HIGH (ref 6–23)
CALCIUM: 9.5 mg/dL (ref 8.4–10.5)
CO2: 29 mEq/L (ref 19–32)
CREATININE: 1.45 mg/dL — AB (ref 0.50–1.35)
Chloride: 100 mEq/L (ref 96–112)
GFR calc Af Amer: 54 mL/min — ABNORMAL LOW (ref >90)
GFR, EST NON AFRICAN AMERICAN: 47 mL/min — AB (ref >90)
GLUCOSE: 202 mg/dL — AB (ref 70–99)
Potassium: 4.7 mEq/L (ref 3.7–5.3)
SODIUM: 142 meq/L (ref 137–147)

## 2014-01-01 MED ORDER — IVABRADINE HCL 5 MG PO TABS: 5.0000 mg | ORAL_TABLET | Freq: Two times a day (BID) | ORAL | Status: DC

## 2014-01-01 NOTE — Patient Instructions (Signed)
Start Corlanor 5 mg Twice daily (we have sent the information to the company to get authorization with your insurance and will call you when it is approved)  Labs today  Your physician recommends that you schedule a follow-up appointment in: 3 months

## 2014-01-03 ENCOUNTER — Encounter (HOSPITAL_COMMUNITY): Payer: Self-pay | Admitting: *Deleted

## 2014-01-03 NOTE — Progress Notes (Signed)
Patient ID: [name], male   DOB: 20-Apr-1940, 73 y.o.   MRN: [id number]   Weight Range 196-199 pounds  Baseline proBNP    PCP: Dr Loleta ChanceHill EP: Dr Ladona Ridgelaylor  HPI: [name] is a 73 yo male with history of DM, HTN, hyperlipidemia, CKD stage III (Creatinine 1.7) and DVT (RUE). Diagnosed with systolic HF in 7/14 with EF 20%. Found to have CAD with totally occluded RCA and high-grade lesion in proximal portion of small LCX. LV dysfunction felt to be out of proportion to CAD. Managed medically.   While in the hospital had intermittent atrial flutter while in the hospital thought to be from Milrinone. Diuresed with lasix and milrinone. Discharge weight 196 pounds.   He returns for follow up. Overall feels good. Denies SOB/PND/Orthopnea. He walks with a cane and has some difficulty with balance. Drinking less than 2 liters a day. Not following low salt diet.   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 The RCA is occluded with collaterals and the proximal LCx has a tight stenosis.  ECHO 03/19/13: EF 20%, mild AS  Labs: 11/20/12 Creatinine 1.5 Potassium 4.1 11/27/12 Creatinine 1.39 Potassium 3.9 01/09/13 Creatinine 2.05, KCL 5.5, pro-BNP 1187, cholesterol 121, TG 72, HDL 29, LDL 78 01/15/13 K+ 5.4, creatinine 1.7 01/24/13: K+ 4.7, creatinine 1.79, proBNP 2103 05/16/13 K 5.0 Creatinine 1.45  07/03/13 K 5.1 Creatinine 1.57  9/15 K 4.7, creatinine 1.45, LDL 72  SH: Married, lives at home with his wife.  Nonsmoker.   FH:  CAD  ROS: All systems negative except as listed in HPI, PMH and Problem List.  Past Medical History  Diagnosis Date  . Hypertension   . Hyperlipidemia   . Cervical spinal stenosis   . Lumbar spinal stenosis   . 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  DVT (deep venous thrombosis) 10/2011    RUE; pt denies this hx on 05/16/2013  . Anemia   . Cardiomyopathy     a. 10/2012 Echo: EF 20-25%, diff HK, mod dil LA/RV/RA.  Marland Kitchen. CAD (coronary artery disease)   . Atrial flutter   . CKD (chronic kidney disease), stage III   . Venous insufficiency   . Frequent falls   . 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 Chronic systolic congestive heart failure   . Quadriparesis   . Hypoglycemia   . Cervical myelopathy   . Acute encephalopathy   . Mononeuritis of unspecified site   . Dysrhythmia   . Pneumonia 1960    "once"  . Type II diabetes mellitus   . H/O hiatal hernia   . Arthritis     "all over" (05/16/2013)  . Depression     "son died in service in 1992; still bothers me" (05/16/2013)    Current Outpatient Prescriptions  Medication Sig Dispense Refill  . acarbose (PRECOSE) 50 MG tablet Take 50 mg by mouth 3 (three) times daily with meals. Take 1 tablet 1-3 times a day      . aspirin 81 MG chewable tablet Chew 81 mg by mouth daily.      . carvedilol (COREG) 12.5 MG tablet Take 1.5 tablets (18.75 mg total) by mouth 2 (two) times  daily with a meal.  90 tablet  6  . fluticasone (FLONASE) 50 MCG/ACT nasal spray Place 2 sprays into the nose daily.      . furosemide (LASIX) 40 MG tablet Take 40 mg by mouth daily.       . furosemide (LASIX) 40 MG tablet TAKE ONE TABLET BY MOUTH TWICE DAILY  90 tablet  0  . gabapentin (NEURONTIN) 300 MG capsule Take 300 mg by mouth 3 (three) times daily.      Marland Kitchen glimepiride (AMARYL) 1 MG tablet Take 1 mg by mouth 2 (two) times daily before a meal.      . guaiFENesin (MUCINEX) 600 MG 12 hr tablet Take 600 mg by mouth 2 (two) times daily as needed.       . hydrALAZINE (APRESOLINE) 25 MG tablet TAKE ONE-HALF TABLET BY MOUTH THREE TIMES DAILY  45 tablet  3  . HYDROcodone-acetaminophen (NORCO/VICODIN) 5-325 MG per tablet Take 1 tablet by mouth every 4 (four) hours as needed for severe pain.      . isosorbide  mononitrate (IMDUR) 30 MG 24 hr tablet TAKE ONE TABLET BY MOUTH ONCE DAILY  30 tablet  0  . isosorbide mononitrate (IMDUR) 30 MG 24 hr tablet TAKE ONE TABLET BY MOUTH ONCE DAILY  30 tablet  6  . LORazepam (ATIVAN) 0.5 MG tablet Take 1 tablet (0.5 mg total) by mouth every 6 (six) hours as needed for anxiety. For anxiety  15 tablet  0  . magnesium hydroxide (MILK OF MAGNESIA) 400 MG/5ML suspension Take 30 mL by mouth daily as needed for constipation.      . metFORMIN (GLUCOPHAGE) 500 MG tablet Take 500 mg by mouth 2 (two) times daily with a meal.       . pravastatin (PRAVACHOL) 40 MG tablet Take 1 tablet (40 mg total) by mouth at bedtime.  30 tablet  3  . tamsulosin (FLOMAX) 0.4 MG CAPS capsule       . ivabradine HCl (CORLANOR) 5 MG TABS tablet Take 1 tablet (5 mg total) by mouth 2 (two) times daily with a meal.  60 tablet     No current facility-administered medications for this encounter.   Filed Vitals:   01/01/14 1533  BP: 116/72  Pulse: 95  Weight: 184 lb 12.8 oz (83.825 kg)  SpO2: 98%    PHYSICAL EXAM: General: Elderly. No resp difficulty, uses walker and cane. Wife present  HEENT: normal Neck: supple. JVP 5-6  Carotids 2+ bilaterally; no bruits. No lymphadenopathy or thryomegaly appreciated. Cor: PMI normal. Distant heart sounds. Regular rate & rhythm. No rubs or gallops +SEM RSB L upper chest scar from ICD.    Lungs: clear Abdomen: soft, nontender, nondistended. No hepatosplenomegaly. No bruits or masses. Good bowel sounds. Extremities: no cyanosis, clubbing, rash.  No lower extremity edema   Neuro: alert & orientedx3, cranial nerves grossly intact. Moves all 4 extremities w/o difficulty. Affect pleasant.   Assessment/Plan: 1. Chronic systolic CHF: Mixed Ischemic/nonischemic cardiomyopathy with chronic LBBB s/p St Jude CRT-D, EF 20-25% (03/2013).  Volume stable.  NYHA class II symptoms.  - Continue Coreg and hydralazine/Imdur at current doses.  He has not tolerated uptitration  due to orthostatic symptoms.   - He is not on an ACE-I or spironolactone with CKD.  - HR remains elevated and I am not going to increase Coreg further due to concern for orthostasis.  I will therefore start him on ivabradine 5 mg bid. 2. CAD: LHC at last 12/2012 showed  occluded RCA (chronic) with 90% stenosis in small LCx.  No chest pain.  It was decided to avoid PCI to LCx as the vessel was small, he had no chest pain and PCI was unlikely to help LV function significantly, and PCI could compromise the major OM.  No evidence ischemia.  - Continue ASA, statin and BB.  Good lipids in 9/15.  4. Chronic kidney disease, Stage 3-4: Most recent creatinine 1.45.  5. 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 Per Dr Amedeo Kinsman 01/03/2014

## 2014-01-06 ENCOUNTER — Telehealth (HOSPITAL_COMMUNITY): Payer: Self-pay | Admitting: *Deleted

## 2014-01-06 MED ORDER — IVABRADINE HCL 5 MG PO TABS: 5.0000 mg | ORAL_TABLET | Freq: Two times a day (BID) | ORAL | Status: DC

## 2014-01-06 NOTE — Telephone Encounter (Signed)
Received fax from [facility] pt's Corlanor was been approved through 04/04/15, rx sent into them

## 2014-01-07 ENCOUNTER — Telehealth (HOSPITAL_COMMUNITY): Payer: Self-pay | Admitting: Vascular Surgery

## 2014-01-07 NOTE — Telephone Encounter (Signed)
Pt wife called about a new medication that was suppose to be [place]... Pt wife has some questions about the new medicine .Marland Kitchen Please advise

## 2014-01-08 NOTE — Telephone Encounter (Signed)
corlanor has been approved with a $6 copay, attempted to call pt and Left message to call back

## 2014-01-28 ENCOUNTER — Other Ambulatory Visit: Payer: Self-pay | Admitting: Neurosurgery

## 2014-01-28 DIAGNOSIS — M542 Cervicalgia: Secondary | ICD-10-CM

## 2014-02-04 ENCOUNTER — Ambulatory Visit
Admission: RE | Admit: 2014-02-04 | Discharge: 2014-02-04 | Disposition: A | Discharge status: Home / Self Care | Payer: Medicare Other | Source: Ambulatory Visit | Attending: Neurosurgery | Admitting: Neurosurgery

## 2014-02-04 DIAGNOSIS — M542 Cervicalgia: Secondary | ICD-10-CM

## 2014-02-04 MED ORDER — IOHEXOL 300 MG/ML  SOLN
10.0000 mL | Freq: Once | INTRAMUSCULAR | Status: AC | PRN
  Administered 2014-02-04: 10 mL via INTRATHECAL

## 2014-02-04 NOTE — Discharge Instructions (Signed)

## 2014-02-18 ENCOUNTER — Encounter: Payer: Medicare Other | Admitting: *Deleted

## 2014-02-18 ENCOUNTER — Telehealth: Payer: Self-pay | Admitting: Cardiology

## 2014-02-18 NOTE — Telephone Encounter (Signed)
Spoke with pt and reminded pt of remote transmission that is due today. Pt verbalized understanding.   

## 2014-02-21 ENCOUNTER — Encounter: Payer: Self-pay | Admitting: Cardiology

## 2014-03-12 ENCOUNTER — Telehealth: Payer: Self-pay | Admitting: Internal Medicine

## 2014-03-12 NOTE — Telephone Encounter (Signed)
PT WANTS TO KNOW IF CAN USE POWER TOOLS??

## 2014-03-13 ENCOUNTER — Encounter (HOSPITAL_COMMUNITY): Payer: Self-pay | Admitting: Cardiology

## 2014-03-13 NOTE — Telephone Encounter (Signed)
New problem   Pt want to talk to nurse b/c he want to proceed with having neck surgery. Please call pt.

## 2014-03-13 NOTE — Telephone Encounter (Signed)
Spoke w/pt and answered all questions about power tools.

## 2014-03-21 ENCOUNTER — Other Ambulatory Visit (HOSPITAL_COMMUNITY): Payer: Self-pay | Admitting: Neurosurgery

## 2014-04-02 ENCOUNTER — Encounter (HOSPITAL_COMMUNITY): Payer: Medicare Other

## 2014-04-10 ENCOUNTER — Encounter (HOSPITAL_COMMUNITY)
Admission: RE | Admit: 2014-04-10 | Discharge: 2014-04-10 | Disposition: A | Discharge status: Home / Self Care | Payer: Medicare Other | Source: Ambulatory Visit | Attending: Neurosurgery | Admitting: Neurosurgery

## 2014-04-10 ENCOUNTER — Other Ambulatory Visit: Payer: Self-pay

## 2014-04-10 ENCOUNTER — Telehealth (HOSPITAL_COMMUNITY): Payer: Self-pay

## 2014-04-10 ENCOUNTER — Encounter (HOSPITAL_COMMUNITY): Payer: Self-pay

## 2014-04-10 DIAGNOSIS — I4892 Unspecified atrial flutter: Secondary | ICD-10-CM | POA: Insufficient documentation

## 2014-04-10 DIAGNOSIS — I429 Cardiomyopathy, unspecified: Secondary | ICD-10-CM | POA: Insufficient documentation

## 2014-04-10 DIAGNOSIS — I129 Hypertensive chronic kidney disease with stage 1 through stage 4 chronic kidney disease, or unspecified chronic kidney disease: Secondary | ICD-10-CM | POA: Insufficient documentation

## 2014-04-10 DIAGNOSIS — N189 Chronic kidney disease, unspecified: Secondary | ICD-10-CM | POA: Insufficient documentation

## 2014-04-10 DIAGNOSIS — I5020 Unspecified systolic (congestive) heart failure: Secondary | ICD-10-CM | POA: Insufficient documentation

## 2014-04-10 DIAGNOSIS — Z95810 Presence of automatic (implantable) cardiac defibrillator: Secondary | ICD-10-CM | POA: Diagnosis not present

## 2014-04-10 DIAGNOSIS — E119 Type 2 diabetes mellitus without complications: Secondary | ICD-10-CM | POA: Diagnosis not present

## 2014-04-10 DIAGNOSIS — Z01818 Encounter for other preprocedural examination: Secondary | ICD-10-CM | POA: Insufficient documentation

## 2014-04-10 DIAGNOSIS — D649 Anemia, unspecified: Secondary | ICD-10-CM | POA: Insufficient documentation

## 2014-04-10 DIAGNOSIS — E785 Hyperlipidemia, unspecified: Secondary | ICD-10-CM | POA: Insufficient documentation

## 2014-04-10 DIAGNOSIS — I459 Conduction disorder, unspecified: Secondary | ICD-10-CM | POA: Diagnosis not present

## 2014-04-10 DIAGNOSIS — I2510 Atherosclerotic heart disease of native coronary artery without angina pectoris: Secondary | ICD-10-CM | POA: Diagnosis not present

## 2014-04-10 HISTORY — DX: Reserved for inherently not codable concepts without codable children: IMO0001

## 2014-04-10 HISTORY — DX: Gastro-esophageal reflux disease without esophagitis: K21.9

## 2014-04-10 HISTORY — DX: Presence of automatic (implantable) cardiac defibrillator: Z95.810

## 2014-04-10 LAB — SURGICAL PCR SCREEN
MRSA, PCR: NEGATIVE
STAPHYLOCOCCUS AUREUS: POSITIVE — AB

## 2014-04-10 LAB — BASIC METABOLIC PANEL
Anion gap: 6 (ref 5–15)
BUN: 36 mg/dL — AB (ref 6–23)
CO2: 30 mmol/L (ref 19–32)
CREATININE: 2.09 mg/dL — AB (ref 0.50–1.35)
Calcium: 9.9 mg/dL (ref 8.4–10.5)
Chloride: 105 mEq/L (ref 96–112)
GFR calc Af Amer: 34 mL/min — ABNORMAL LOW (ref >90)
GFR calc non Af Amer: 30 mL/min — ABNORMAL LOW (ref >90)
Glucose, Bld: 123 mg/dL — ABNORMAL HIGH (ref 70–99)
Potassium: 4.9 mmol/L (ref 3.5–5.1)
Sodium: 141 mmol/L (ref 135–145)

## 2014-04-10 LAB — CBC
HEMATOCRIT: 38.2 % — AB (ref 39.0–52.0)
HEMOGLOBIN: 12.8 g/dL — AB (ref 13.0–17.0)
MCH: 31.9 pg (ref 26.0–34.0)
MCHC: 33.5 g/dL (ref 30.0–36.0)
MCV: 95.3 fL (ref 78.0–100.0)
Platelets: 216 10*3/uL (ref 150–400)
RBC: 4.01 MIL/uL — ABNORMAL LOW (ref 4.22–5.81)
RDW: 15.5 % (ref 11.5–15.5)
WBC: 11.9 10*3/uL — ABNORMAL HIGH (ref 4.0–10.5)

## 2014-04-10 NOTE — Pre-Procedure Instructions (Signed)
[name]  04/10/2014   Your procedure is scheduled on:  Thursday, January 14.  Report to [facility] Admitting at 5:30AM.  Call this number if you have problems the morning of surgery: [phone]   Remember:   Do not eat food or drink liquids after midnight Wednesday, January 13.   Take these medicines the morning of surgery with A SIP OF WATER: carvedilol (COREG), gabapentin (NEURONTIN), hydrALAZINE (APRESOLINE), isosorbide mononitrate (IMDUR).   Do not wear jewelry, make-up or nail polish.  Do not wear lotions, powders, or perfume.   Men may shave face and neck.  Do not bring valuables to the hospital.               [facility] is not responsible for any belongings or valuables.               Contacts, dentures or bridgework may not be worn into surgery.  Leave suitcase in the car. After surgery it may be brought to your room.  For patients admitted to the hospital, discharge time is determined by your treatment team.               Patients discharged the day of surgery will not be allowed to drive home.   Name and phone number of your driver: -                          Special Instructions:  Review  [place] - Preparing For Surgery.   Please read over the following fact sheets that you were given: Pain Booklet, Coughing and Deep Breathing and Surgical Site Infection Prevention

## 2014-04-10 NOTE — Telephone Encounter (Addendum)
NP from short stay at [facility] contacted [place] to report patient very orthostatic during pre-procedural testing today.  Lying down 130/68, standing 76/40.  Weight is at baseline, patient "feels fine".  Per Amy Clegg NP-C, hold lasix x 2 days, we have scheduled him to be seen this Monday to further evaluate.  Patient aware and agreeable.  Ave Filter

## 2014-04-10 NOTE — Pre-Procedure Instructions (Addendum)
[name]  04/10/2014   Your procedure is scheduled on:  Thursday, January 14.  Report to [facility] Admitting at 5:30AM.  Call this number if you have problems the morning of surgery: [phone]   Remember:   Do not eat food or drink liquids after midnight Wednesday, January 13.   Take these medicines the morning of surgery with A SIP OF WATER: carvedilol (COREG), gabapentin (NEURONTIN), hydrALAZINE (APRESOLINE), isosorbide mononitrate (IMDUR), ivabradine HCl (CORLANOR).                Take if needed:HYDROcodone-acetaminophen (NORCO/VICODIN),  LORazepam (ATIVAN),                   Do not take medications for Diabetes the morning of surgery.                   Stop taking Aspirin, Coumadin, Plavix, Effient and Herbal medications.  Do not take any NSAIDs ie: Ibuprofen,  Advil,Naproxen or any medication containing Aspirin.   Do not wear jewelry, make-up or nail polish.  Do not wear lotions, powders, or perfume.   Men may shave face and neck.  Do not bring valuables to the hospital.               [facility] is not responsible for any belongings or valuables.               Contacts, dentures or bridgework may not be worn into surgery.  Leave suitcase in the car. After surgery it may be brought to your room.  For patients admitted to the hospital, discharge time is determined by your treatment team.               Patients discharged the day of surgery will not be allowed to drive home.   Name and phone number of your driver: -                          Special Instructions:  Review  [address] - Preparing For Surgery.   Please read over the following fact sheets that you were given: Pain Booklet, Coughing and Deep Breathing and Surgical Site Infection Prevention

## 2014-04-10 NOTE — Progress Notes (Addendum)
Anesthesia consult:   Pt is 74 year old male scheduled for anterior cervical decompression/discectomy C3-4 on 04/17/2014 with Dr. Lovell Sheehan.   PMH includes: cardiomyopathy, CAD, systolic CHF, HTN, atrial flutter, DM, CKD, biventricular ICD, anemia, hyperlipidemia.   Medications include: ASA, carvedilol, lasix, hydralazine, imdur, ivabradine, metformin, glimepiride, acarbose, pravastatin.   Pt bp today in PAT 70's systolic. Manually taken for bp 82/52. Pt denies dizziness, CP, SOB, edema, weight gain or loss (usual weight is 185, here in PAT is 187 but pt feels that is stable as he just ate lunch and hasn't pooped yet). After further conversation pt reports dizziness associated with position changes but it quickly resolves.   EKG shows NSR. LAD. Nonspecific intraventricular conduction block. Possible lateral infarct, age undetermined. Appears stable.   Orthostatic BP: Lying: 134/68 Sitting: 126/51 Standing: 76/48  CV RRR. No peripheral edema. Lungs CTA B.   Called heart failure clinic and spoke with Tonye Becket, NP. Instructed to have pt hold lasix for 2 days and then resume. Pt will need cardiac clearance prior to surgery. Pt now has appt with CHF clinic 04/14/14 at 10:40am. Pt aware of med change and appt.   Preoperative labs reviewed.  BUN/Cr 36/2.09.   Rica Mast, FNP-BC [facility]/Anesthesiology Phone: [phone] 04/11/2014 1:17 PM  Addendum:  Pt seen in CHF clinic today. Per Dr. Gala Romney, "We had long talk about surgical risk and I feel that he is at moderate to high (but not prohibitive) risk for CV complications including death with surgery. However he tolerated his last surgery well. Have given him the ok to proceed."  Rica Mast, FNP-BC [facility]/Anesthesiology Phone: [phone] 04/14/2014 3:19 PM

## 2014-04-11 NOTE — Progress Notes (Signed)
I called a prescription for Mupirocin ointment to [facility], [address].

## 2014-04-14 ENCOUNTER — Ambulatory Visit (HOSPITAL_COMMUNITY)
Admission: RE | Admit: 2014-04-14 | Discharge: 2014-04-14 | Disposition: A | Discharge status: Home / Self Care | Payer: Medicare Other | Source: Ambulatory Visit | Attending: Internal Medicine | Admitting: Internal Medicine

## 2014-04-14 VITALS — BP 166/86 | HR 89 | Wt 188.1 lb

## 2014-04-14 DIAGNOSIS — I429 Cardiomyopathy, unspecified: Secondary | ICD-10-CM | POA: Insufficient documentation

## 2014-04-14 DIAGNOSIS — I5022 Chronic systolic (congestive) heart failure: Secondary | ICD-10-CM

## 2014-04-14 DIAGNOSIS — Z86718 Personal history of other venous thrombosis and embolism: Secondary | ICD-10-CM | POA: Diagnosis not present

## 2014-04-14 DIAGNOSIS — M4802 Spinal stenosis, cervical region: Secondary | ICD-10-CM | POA: Insufficient documentation

## 2014-04-14 DIAGNOSIS — Z79899 Other long term (current) drug therapy: Secondary | ICD-10-CM | POA: Diagnosis not present

## 2014-04-14 DIAGNOSIS — Z95810 Presence of automatic (implantable) cardiac defibrillator: Secondary | ICD-10-CM | POA: Diagnosis not present

## 2014-04-14 DIAGNOSIS — I447 Left bundle-branch block, unspecified: Secondary | ICD-10-CM | POA: Insufficient documentation

## 2014-04-14 DIAGNOSIS — Z7982 Long term (current) use of aspirin: Secondary | ICD-10-CM | POA: Insufficient documentation

## 2014-04-14 DIAGNOSIS — M4804 Spinal stenosis, thoracic region: Secondary | ICD-10-CM | POA: Insufficient documentation

## 2014-04-14 DIAGNOSIS — I2510 Atherosclerotic heart disease of native coronary artery without angina pectoris: Secondary | ICD-10-CM | POA: Diagnosis not present

## 2014-04-14 DIAGNOSIS — Z01810 Encounter for preprocedural cardiovascular examination: Secondary | ICD-10-CM

## 2014-04-14 DIAGNOSIS — N183 Chronic kidney disease, stage 3 (moderate): Secondary | ICD-10-CM | POA: Diagnosis not present

## 2014-04-14 DIAGNOSIS — E119 Type 2 diabetes mellitus without complications: Secondary | ICD-10-CM | POA: Diagnosis not present

## 2014-04-14 DIAGNOSIS — I129 Hypertensive chronic kidney disease with stage 1 through stage 4 chronic kidney disease, or unspecified chronic kidney disease: Secondary | ICD-10-CM | POA: Insufficient documentation

## 2014-04-14 DIAGNOSIS — E785 Hyperlipidemia, unspecified: Secondary | ICD-10-CM | POA: Diagnosis not present

## 2014-04-14 LAB — BASIC METABOLIC PANEL
Anion gap: 9 (ref 5–15)
BUN: 22 mg/dL (ref 6–23)
CALCIUM: 10.2 mg/dL (ref 8.4–10.5)
CHLORIDE: 105 meq/L (ref 96–112)
CO2: 24 mmol/L (ref 19–32)
CREATININE: 1.2 mg/dL (ref 0.50–1.35)
GFR calc Af Amer: 67 mL/min — ABNORMAL LOW (ref >90)
GFR, EST NON AFRICAN AMERICAN: 58 mL/min — AB (ref >90)
Glucose, Bld: 124 mg/dL — ABNORMAL HIGH (ref 70–99)
Potassium: 4.7 mmol/L (ref 3.5–5.1)
Sodium: 138 mmol/L (ref 135–145)

## 2014-04-14 MED ORDER — CARVEDILOL 12.5 MG PO TABS: 12.5000 mg | ORAL_TABLET | Freq: Two times a day (BID) | ORAL | Status: DC

## 2014-04-14 MED ORDER — FUROSEMIDE 40 MG PO TABS: 40.0000 mg | ORAL_TABLET | ORAL | Status: DC

## 2014-04-14 NOTE — Progress Notes (Signed)
Patient ID: [name], male   DOB: 1940-05-18, 74 y.o.   MRN: [id number]   Weight Range 196-199 pounds  Baseline proBNP    PCP: Jason Loleta Chance EP: [name]  HPI: [name] is a 74 yo male with history of DM, HTN, hyperlipidemia, CKD stage III (Creatinine 1.7) and DVT (RUE). Diagnosed with systolic HF in 7/14 with EF 20%. Found to have CAD with totally occluded RCA and high-grade lesion in proximal portion of small LCX. LV dysfunction felt to be out of proportion to CAD. Managed medically.   While in the hospital had intermittent atrial flutter while in the hospital thought to be from Milrinone. Diuresed with lasix and milrinone. Discharge weight 196 pounds.   He returns for follow up. Last visit ivabradine was started 5 mg twice a day. HF clinic called the end of last week by same day surgery for orthostatic hypotension. He was instructed to hold lasix for 2 days however he has not restarted.  Overall feels ok but has neck pain. Denies SOB/PND/Orthopnea.Mild dizziness standing. Ambulates with a walker.  Not weighing at home. His wife prepares his medications because he has neuropathy. Drinking less than 2 liters a day. Not following low salt diet.    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 The RCA is occluded with collaterals and the proximal LCx has a tight stenosis.  ECHO 03/19/13: EF 20%, mild AS  Labs: 11/20/12 Creatinine 1.5 Potassium 4.1 11/27/12 Creatinine 1.39 Potassium 3.9 01/09/13 Creatinine 2.05, KCL 5.5, pro-BNP 1187, cholesterol 121, TG 72, HDL 29, LDL 78 01/15/13 K+ 5.4, creatinine 1.7 01/24/13: K+ 4.7, creatinine 1.79, proBNP 08/19/01 2013/06/01 K 5.0 Creatinine 1.45  07/03/13 K 5.1 Creatinine 1.57  9/15 K 4.7, creatinine 1.45, LDL 72 04/10/2014 K 4.9 Creatinine 2.09   SH:  Married, lives at home with his wife.  Nonsmoker.   FH:  CAD  ROS: All systems negative except as listed in HPI, PMH and Problem List.  Past Medical History  Diagnosis Date  . Hypertension   . Hyperlipidemia   . Cervical spinal stenosis   . Lumbar spinal stenosis   . 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 DVT (deep venous thrombosis) 10/2011    RUE; pt denies this hx on 2013-06-01  . Anemia   . Cardiomyopathy     a. 10/2012 Echo: EF 20-25%, diff HK, mod dil LA/RV/RA.  Marland Kitchen CAD (coronary artery disease)   . Atrial flutter   . CKD (chronic kidney disease), stage III   . Venous insufficiency   . Frequent falls   . 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 Chronic systolic congestive heart failure   . Quadriparesis 08-20-11    pre op  . Hypoglycemia   . Cervical myelopathy   . Acute encephalopathy   . Mononeuritis of unspecified site   . Dysrhythmia   . H/O hiatal hernia   . Arthritis     "all over" (01-Jun-2013)  . Depression     "son died in service in 08-20-1990; still bothers me" (06-01-2013)  . AICD (automatic cardioverter/defibrillator) present   . Shortness of breath dyspnea     with exertion  . Pneumonia 1960    ,  also 2014  . Type II diabetes mellitus     type 2  . GERD (gastroesophageal reflux disease)     Current Outpatient Prescriptions  Medication Sig Dispense Refill  . acarbose (PRECOSE) 50 MG tablet Take 50 mg by mouth 3 (three) times daily with meals. Take 1 tablet 1-3 times a day    . aspirin 81 MG chewable tablet Chew 81 mg by mouth daily.    . carvedilol (COREG) 12.5 MG tablet Take 1.5 tablets (18.75 mg total) by mouth 2 (two) times daily with a meal. (Patient taking differently: Take 12.5 mg by mouth 2 (two) times daily with a meal. ) 90 tablet 6  . docusate sodium (COLACE) 100 MG capsule Take 100 mg by mouth 2 (two) times daily.    . fluticasone (FLONASE) 50 MCG/ACT nasal spray Place 2 sprays into the nose daily.    .  furosemide (LASIX) 40 MG tablet TAKE ONE TABLET BY MOUTH TWICE DAILY 90 tablet 0  . gabapentin (NEURONTIN) 300 MG capsule Take 300 mg by mouth 3 (three) times daily.    Marland Kitchen glimepiride (AMARYL) 1 MG tablet Take 1 mg by mouth 2 (two) times daily before a meal.    . guaiFENesin (MUCINEX) 600 MG 12 hr tablet Take 600 mg by mouth 2 (two) times daily as needed.     . hydrALAZINE (APRESOLINE) 25 MG tablet TAKE ONE-HALF TABLET BY MOUTH THREE TIMES DAILY 45 tablet 3  . HYDROcodone-acetaminophen (NORCO/VICODIN) 5-325 MG per tablet Take 1 tablet by mouth every 4 (four) hours as needed for severe pain.    . isosorbide mononitrate (IMDUR) 30 MG 24 hr tablet TAKE ONE TABLET BY MOUTH ONCE DAILY 30 tablet 6  . ivabradine HCl (CORLANOR) 5 MG TABS tablet Take 1 tablet (5 mg total) by mouth 2 (two) times daily with a meal. 180 tablet 3  . magnesium hydroxide (MILK OF MAGNESIA) 400 MG/5ML suspension Take 30 mL by mouth daily as needed for constipation.    . metFORMIN (GLUCOPHAGE) 500 MG tablet Take 500 mg by mouth 2 (two) times daily with a meal.     . pravastatin (PRAVACHOL) 40 MG tablet Take 1 tablet (40 mg total) by mouth at bedtime. 30 tablet 3  . Xylometazoline HCl (4-WAY NASAL SPRAY NA) Place into the nose.     No current facility-administered medications for this encounter.   Filed Vitals:   04/14/14 1138  BP: 166/86  Pulse: 89  Weight: 188 lb 1.9 oz (85.331 kg)  SpO2: 100%    PHYSICAL EXAM: General: Elderly. No resp difficulty, uses walker and cane. Wife present  HEENT: normal Neck: supple. JVP 8-9  Carotids 2+ bilaterally; no bruits. No lymphadenopathy or thryomegaly appreciated. Cor: PMI normal. Distant heart sounds. Regular rate & rhythm. No rubs or gallops +SEM RSB L upper chest scar from ICD.    Lungs: clear Abdomen: soft, nontender, nondistended. No hepatosplenomegaly. No bruits or masses. Good bowel sounds. Extremities: no cyanosis, clubbing, rash.  No lower extremity edema   Neuro: alert &  orientedx3, cranial nerves grossly intact. Moves all 4 extremities w/o difficulty. Affect pleasant.   Assessment/Plan: 1. Chronic systolic CHF: Mixed Ischemic/nonischemic cardiomyopathy with chronic LBBB s/p St Jude CRT-D, EF 20-25% (03/2013).   Volume status mildly elevated after holding lasix due to orthostasis. Restart lasix to 40 mg every other day = prn on other days for weight gain. - Cut back  Coreg 12.5 mg twice a day -Continue hydralazine 12.5 mg tid and 30  mg dailyl.  He has not tolerated uptitration due to orthostatic symptoms.   - He is not on an ACE-I or spironolactone with CKD.  - Continue ivabradine 5 mg bid. Currently not sure if he is taking. Will increase to 7.5 bid at next visit if HR > 60. Check BMET   Repeat ECHO 2. CAD: LHC at last 12/2012 showed occluded RCA (chronic) with 90% stenosis in small LCx.  No chest pain.  It was decided to avoid PCI to LCx as the vessel was small, he had no chest pain and PCI was unlikely to help LV function significantly, and PCI could compromise the major OM.  No evidence ischemia.  - Continue ASA, statin and BB.  Good lipids in 9/15.  4. Chronic kidney disease, Stage 3-4: Most recent creatinine  2.0   5. 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 Per [name]  6. Spinal stenosis C3-4, C6-7, & C7-T1. Pending cervical decompresssion by Jason Lovell Sheehan. He is ok for surgery but is moderate to high risk.    CLEGG,AMY NP-C  04/14/2014  Patient seen and examined with Tonye Becket, NP. We discussed all aspects of the encounter. I agree with the assessment and plan as stated above.   Overall improved after holding lasix will restart every other day. Stressed need not to miss doses of his HF meds.   We had long talk about surgical risk and I feel that he is at moderate to high (but not prohibitive) risk for CV complications including death with surgery. However he tolerated his last surgery well. Have given him the ok to proceed.   Total time spent 35 minutes. Over  half that time spent discussing above.    Charlese Gruetzmacher,MD 3:03 PM

## 2014-04-14 NOTE — Patient Instructions (Signed)
DECREASE Lasix to 40mg  tablet once EVERY OTHER DAY.  Follow up 6 weeks with echocardiogram.  Do the following things EVERYDAY: 1) Weigh yourself in the morning before breakfast. Write it down and keep it in a log. 2) Take your medicines as prescribed 3) Eat low salt foods-Limit salt (sodium) to 2000 mg per day.  4) Stay as active as you can everyday 5) Limit all fluids for the day to less than 2 liters

## 2014-04-16 MED ORDER — CEFAZOLIN SODIUM-DEXTROSE 2-3 GM-% IV SOLR
2.0000 g | INTRAVENOUS | Status: AC
  Administered 2014-04-17: 2 g via INTRAVENOUS
  Filled 2014-04-16: qty 50

## 2014-04-17 ENCOUNTER — Ambulatory Visit (HOSPITAL_COMMUNITY): Payer: Medicare Other | Admitting: Anesthesiology

## 2014-04-17 ENCOUNTER — Ambulatory Visit (HOSPITAL_COMMUNITY): Payer: Medicare Other | Admitting: Emergency Medicine

## 2014-04-17 ENCOUNTER — Encounter (HOSPITAL_COMMUNITY)
Admission: RE | Disposition: A | Discharge status: Home / Self Care | Payer: Self-pay | Source: Ambulatory Visit | Attending: Neurosurgery

## 2014-04-17 ENCOUNTER — Encounter (HOSPITAL_COMMUNITY): Payer: Self-pay | Admitting: Anesthesiology

## 2014-04-17 ENCOUNTER — Ambulatory Visit (HOSPITAL_COMMUNITY): Payer: Medicare Other

## 2014-04-17 ENCOUNTER — Ambulatory Visit (HOSPITAL_COMMUNITY)
Admission: RE | Admit: 2014-04-17 | Discharge: 2014-04-19 | Disposition: A | Discharge status: Home / Self Care | Payer: Medicare Other | Source: Ambulatory Visit | Attending: Neurosurgery | Admitting: Neurosurgery

## 2014-04-17 DIAGNOSIS — E785 Hyperlipidemia, unspecified: Secondary | ICD-10-CM | POA: Diagnosis not present

## 2014-04-17 DIAGNOSIS — G90521 Complex regional pain syndrome I of right lower limb: Secondary | ICD-10-CM | POA: Diagnosis not present

## 2014-04-17 DIAGNOSIS — G90511 Complex regional pain syndrome I of right upper limb: Secondary | ICD-10-CM | POA: Insufficient documentation

## 2014-04-17 DIAGNOSIS — Z95810 Presence of automatic (implantable) cardiac defibrillator: Secondary | ICD-10-CM | POA: Insufficient documentation

## 2014-04-17 DIAGNOSIS — D649 Anemia, unspecified: Secondary | ICD-10-CM | POA: Diagnosis not present

## 2014-04-17 DIAGNOSIS — E119 Type 2 diabetes mellitus without complications: Secondary | ICD-10-CM | POA: Diagnosis not present

## 2014-04-17 DIAGNOSIS — N183 Chronic kidney disease, stage 3 (moderate): Secondary | ICD-10-CM | POA: Insufficient documentation

## 2014-04-17 DIAGNOSIS — G8250 Quadriplegia, unspecified: Secondary | ICD-10-CM | POA: Insufficient documentation

## 2014-04-17 DIAGNOSIS — M4712 Other spondylosis with myelopathy, cervical region: Secondary | ICD-10-CM | POA: Diagnosis not present

## 2014-04-17 DIAGNOSIS — F329 Major depressive disorder, single episode, unspecified: Secondary | ICD-10-CM | POA: Diagnosis not present

## 2014-04-17 DIAGNOSIS — I129 Hypertensive chronic kidney disease with stage 1 through stage 4 chronic kidney disease, or unspecified chronic kidney disease: Secondary | ICD-10-CM | POA: Insufficient documentation

## 2014-04-17 DIAGNOSIS — M4802 Spinal stenosis, cervical region: Secondary | ICD-10-CM | POA: Diagnosis not present

## 2014-04-17 DIAGNOSIS — I2510 Atherosclerotic heart disease of native coronary artery without angina pectoris: Secondary | ICD-10-CM | POA: Diagnosis not present

## 2014-04-17 DIAGNOSIS — I429 Cardiomyopathy, unspecified: Secondary | ICD-10-CM | POA: Diagnosis not present

## 2014-04-17 DIAGNOSIS — Z79899 Other long term (current) drug therapy: Secondary | ICD-10-CM | POA: Diagnosis not present

## 2014-04-17 DIAGNOSIS — Z888 Allergy status to other drugs, medicaments and biological substances status: Secondary | ICD-10-CM | POA: Insufficient documentation

## 2014-04-17 DIAGNOSIS — K219 Gastro-esophageal reflux disease without esophagitis: Secondary | ICD-10-CM | POA: Insufficient documentation

## 2014-04-17 DIAGNOSIS — M4322 Fusion of spine, cervical region: Secondary | ICD-10-CM | POA: Diagnosis not present

## 2014-04-17 DIAGNOSIS — M5010 Cervical disc disorder with radiculopathy, unspecified cervical region: Secondary | ICD-10-CM | POA: Diagnosis not present

## 2014-04-17 DIAGNOSIS — I4892 Unspecified atrial flutter: Secondary | ICD-10-CM | POA: Diagnosis not present

## 2014-04-17 DIAGNOSIS — M5000 Cervical disc disorder with myelopathy, unspecified cervical region: Secondary | ICD-10-CM | POA: Diagnosis not present

## 2014-04-17 HISTORY — PX: ANTERIOR CERVICAL DECOMP/DISCECTOMY FUSION: SHX1161

## 2014-04-17 LAB — GLUCOSE, CAPILLARY
GLUCOSE-CAPILLARY: 124 mg/dL — AB (ref 70–99)
GLUCOSE-CAPILLARY: 155 mg/dL — AB (ref 70–99)
GLUCOSE-CAPILLARY: 190 mg/dL — AB (ref 70–99)
Glucose-Capillary: 206 mg/dL — ABNORMAL HIGH (ref 70–99)

## 2014-04-17 SURGERY — ANTERIOR CERVICAL DECOMPRESSION/DISCECTOMY FUSION 1 LEVEL/HARDWARE REMOVAL
Anesthesia: General

## 2014-04-17 MED ORDER — DEXAMETHASONE SODIUM PHOSPHATE 4 MG/ML IJ SOLN
4.0000 mg | Freq: Four times a day (QID) | INTRAMUSCULAR | Status: AC
  Administered 2014-04-18: 4 mg via INTRAVENOUS
  Filled 2014-04-17: qty 1

## 2014-04-17 MED ORDER — IVABRADINE HCL 5 MG PO TABS
5.0000 mg | ORAL_TABLET | Freq: Two times a day (BID) | ORAL | Status: DC
  Administered 2014-04-17 – 2014-04-19 (×4): 5 mg via ORAL
  Filled 2014-04-17 (×7): qty 1

## 2014-04-17 MED ORDER — HYDROMORPHONE HCL 1 MG/ML IJ SOLN
INTRAMUSCULAR | Status: AC
  Filled 2014-04-17: qty 1

## 2014-04-17 MED ORDER — PHENYLEPHRINE HCL 10 MG/ML IJ SOLN
10.0000 mg | INTRAVENOUS | Status: DC | PRN
  Administered 2014-04-17: 25 ug/min via INTRAVENOUS

## 2014-04-17 MED ORDER — DIAZEPAM 5 MG PO TABS
5.0000 mg | ORAL_TABLET | Freq: Four times a day (QID) | ORAL | Status: DC | PRN
  Filled 2014-04-17: qty 1

## 2014-04-17 MED ORDER — MIDAZOLAM HCL 5 MG/5ML IJ SOLN
INTRAMUSCULAR | Status: DC | PRN
  Administered 2014-04-17: 2 mg via INTRAVENOUS

## 2014-04-17 MED ORDER — ALUM & MAG HYDROXIDE-SIMETH 200-200-20 MG/5ML PO SUSP
30.0000 mL | Freq: Four times a day (QID) | ORAL | Status: DC | PRN
  Filled 2014-04-17: qty 30

## 2014-04-17 MED ORDER — DEXAMETHASONE 4 MG PO TABS
4.0000 mg | ORAL_TABLET | Freq: Four times a day (QID) | ORAL | Status: AC
  Administered 2014-04-17: 4 mg via ORAL
  Filled 2014-04-17 (×2): qty 1

## 2014-04-17 MED ORDER — VECURONIUM BROMIDE 10 MG IV SOLR
INTRAVENOUS | Status: DC | PRN
  Administered 2014-04-17: 2 mg via INTRAVENOUS
  Administered 2014-04-17: 1 mg via INTRAVENOUS

## 2014-04-17 MED ORDER — SODIUM CHLORIDE 0.9 % IJ SOLN
INTRAMUSCULAR | Status: AC
  Filled 2014-04-17: qty 10

## 2014-04-17 MED ORDER — EPHEDRINE SULFATE 50 MG/ML IJ SOLN
INTRAMUSCULAR | Status: DC | PRN
  Administered 2014-04-17: 5 mg via INTRAVENOUS
  Administered 2014-04-17: 10 mg via INTRAVENOUS

## 2014-04-17 MED ORDER — GLYCOPYRROLATE 0.2 MG/ML IJ SOLN
INTRAMUSCULAR | Status: DC | PRN
  Administered 2014-04-17: .7 mg via INTRAVENOUS

## 2014-04-17 MED ORDER — GLIMEPIRIDE 1 MG PO TABS
1.0000 mg | ORAL_TABLET | Freq: Two times a day (BID) | ORAL | Status: DC
  Administered 2014-04-17 – 2014-04-19 (×4): 1 mg via ORAL
  Filled 2014-04-17 (×7): qty 1

## 2014-04-17 MED ORDER — BUPIVACAINE-EPINEPHRINE 0.5% -1:200000 IJ SOLN
INTRAMUSCULAR | Status: DC | PRN
  Administered 2014-04-17: 10 mL

## 2014-04-17 MED ORDER — PRAVASTATIN SODIUM 40 MG PO TABS
40.0000 mg | ORAL_TABLET | Freq: Every day | ORAL | Status: DC
  Administered 2014-04-17 – 2014-04-18 (×2): 40 mg via ORAL
  Filled 2014-04-17 (×3): qty 1

## 2014-04-17 MED ORDER — STERILE WATER FOR INJECTION IJ SOLN
INTRAMUSCULAR | Status: AC
  Filled 2014-04-17: qty 10

## 2014-04-17 MED ORDER — ONDANSETRON HCL 4 MG/2ML IJ SOLN: 4.0000 mg | INTRAMUSCULAR | Status: DC | PRN

## 2014-04-17 MED ORDER — GLYCOPYRROLATE 0.2 MG/ML IJ SOLN
INTRAMUSCULAR | Status: AC
  Filled 2014-04-17: qty 3

## 2014-04-17 MED ORDER — PROMETHAZINE HCL 25 MG/ML IJ SOLN: 6.2500 mg | INTRAMUSCULAR | Status: DC | PRN

## 2014-04-17 MED ORDER — METFORMIN HCL 500 MG PO TABS
500.0000 mg | ORAL_TABLET | Freq: Two times a day (BID) | ORAL | Status: DC
  Administered 2014-04-17 – 2014-04-19 (×4): 500 mg via ORAL
  Filled 2014-04-17 (×6): qty 1

## 2014-04-17 MED ORDER — 0.9 % SODIUM CHLORIDE (POUR BTL) OPTIME
TOPICAL | Status: DC | PRN
  Administered 2014-04-17: 1000 mL

## 2014-04-17 MED ORDER — ROCURONIUM BROMIDE 100 MG/10ML IV SOLN
INTRAVENOUS | Status: DC | PRN
  Administered 2014-04-17: 10 mg via INTRAVENOUS
  Administered 2014-04-17: 40 mg via INTRAVENOUS

## 2014-04-17 MED ORDER — CEFAZOLIN SODIUM-DEXTROSE 2-3 GM-% IV SOLR
2.0000 g | Freq: Three times a day (TID) | INTRAVENOUS | Status: AC
  Administered 2014-04-17 – 2014-04-18 (×2): 2 g via INTRAVENOUS
  Filled 2014-04-17 (×3): qty 50

## 2014-04-17 MED ORDER — ACARBOSE 50 MG PO TABS
50.0000 mg | ORAL_TABLET | Freq: Three times a day (TID) | ORAL | Status: DC
  Administered 2014-04-17 – 2014-04-19 (×5): 50 mg via ORAL
  Filled 2014-04-17 (×9): qty 1

## 2014-04-17 MED ORDER — LACTATED RINGERS IV SOLN
INTRAVENOUS | Status: DC | PRN
  Administered 2014-04-17 (×2): via INTRAVENOUS

## 2014-04-17 MED ORDER — OXYCODONE-ACETAMINOPHEN 5-325 MG PO TABS
1.0000 | ORAL_TABLET | ORAL | Status: DC | PRN
  Filled 2014-04-17: qty 2

## 2014-04-17 MED ORDER — ONDANSETRON HCL 4 MG/2ML IJ SOLN
INTRAMUSCULAR | Status: AC
  Filled 2014-04-17: qty 2

## 2014-04-17 MED ORDER — GLYCOPYRROLATE 0.2 MG/ML IJ SOLN
INTRAMUSCULAR | Status: AC
  Filled 2014-04-17: qty 1

## 2014-04-17 MED ORDER — SODIUM CHLORIDE 0.9 % IR SOLN
Status: DC | PRN
  Administered 2014-04-17: 09:00:00

## 2014-04-17 MED ORDER — ROCURONIUM BROMIDE 50 MG/5ML IV SOLN
INTRAVENOUS | Status: AC
  Filled 2014-04-17: qty 1

## 2014-04-17 MED ORDER — FENTANYL CITRATE 0.05 MG/ML IJ SOLN
INTRAMUSCULAR | Status: DC | PRN
  Administered 2014-04-17 (×2): 50 ug via INTRAVENOUS
  Administered 2014-04-17: 100 ug via INTRAVENOUS

## 2014-04-17 MED ORDER — PHENYLEPHRINE 40 MCG/ML (10ML) SYRINGE FOR IV PUSH (FOR BLOOD PRESSURE SUPPORT)
PREFILLED_SYRINGE | INTRAVENOUS | Status: AC
  Filled 2014-04-17: qty 10

## 2014-04-17 MED ORDER — DEXAMETHASONE SODIUM PHOSPHATE 10 MG/ML IJ SOLN
INTRAMUSCULAR | Status: AC
  Filled 2014-04-17: qty 1

## 2014-04-17 MED ORDER — OXYCODONE HCL 5 MG PO TABS: 5.0000 mg | ORAL_TABLET | Freq: Once | ORAL | Status: DC | PRN

## 2014-04-17 MED ORDER — FUROSEMIDE 40 MG PO TABS
40.0000 mg | ORAL_TABLET | ORAL | Status: DC
  Administered 2014-04-18: 40 mg via ORAL
  Filled 2014-04-17: qty 1

## 2014-04-17 MED ORDER — PROPOFOL 10 MG/ML IV BOLUS
INTRAVENOUS | Status: DC | PRN
Start: 2014-04-17 — End: 2014-04-17
  Administered 2014-04-17: 60 mg via INTRAVENOUS

## 2014-04-17 MED ORDER — MORPHINE SULFATE 2 MG/ML IJ SOLN: 1.0000 mg | INTRAMUSCULAR | Status: DC | PRN

## 2014-04-17 MED ORDER — PHENYLEPHRINE HCL 0.25 % NA SOLN: 1.0000 | Freq: Four times a day (QID) | NASAL | Status: DC | PRN

## 2014-04-17 MED ORDER — INSULIN ASPART 100 UNIT/ML ~~LOC~~ SOLN
0.0000 [IU] | SUBCUTANEOUS | Status: DC
  Administered 2014-04-17 – 2014-04-18 (×5): 4 [IU] via SUBCUTANEOUS
  Administered 2014-04-18: 3 [IU] via SUBCUTANEOUS
  Administered 2014-04-18: 4 [IU] via SUBCUTANEOUS

## 2014-04-17 MED ORDER — EPHEDRINE SULFATE 50 MG/ML IJ SOLN
INTRAMUSCULAR | Status: AC
  Filled 2014-04-17: qty 1

## 2014-04-17 MED ORDER — LACTATED RINGERS IV SOLN
INTRAVENOUS | Status: DC
  Administered 2014-04-17: 15:00:00 via INTRAVENOUS

## 2014-04-17 MED ORDER — ACETAMINOPHEN 650 MG RE SUPP: 650.0000 mg | RECTAL | Status: DC | PRN

## 2014-04-17 MED ORDER — PANTOPRAZOLE SODIUM 40 MG IV SOLR
40.0000 mg | Freq: Every day | INTRAVENOUS | Status: DC
  Administered 2014-04-17: 40 mg via INTRAVENOUS
  Filled 2014-04-17 (×2): qty 40

## 2014-04-17 MED ORDER — DEXAMETHASONE SODIUM PHOSPHATE 10 MG/ML IJ SOLN
INTRAMUSCULAR | Status: DC | PRN
  Administered 2014-04-17: 10 mg via INTRAVENOUS

## 2014-04-17 MED ORDER — ISOSORBIDE MONONITRATE ER 30 MG PO TB24
30.0000 mg | ORAL_TABLET | Freq: Every day | ORAL | Status: DC
  Administered 2014-04-18 – 2014-04-19 (×2): 30 mg via ORAL
  Filled 2014-04-17 (×2): qty 1

## 2014-04-17 MED ORDER — MUPIROCIN 2 % EX OINT
TOPICAL_OINTMENT | Freq: Once | CUTANEOUS | Status: AC
  Administered 2014-04-17: 1 via NASAL

## 2014-04-17 MED ORDER — PHENOL 1.4 % MT LIQD
1.0000 | OROMUCOSAL | Status: DC | PRN
Start: 2014-04-17 — End: 2014-04-19

## 2014-04-17 MED ORDER — ONDANSETRON HCL 4 MG/2ML IJ SOLN
INTRAMUSCULAR | Status: DC | PRN
  Administered 2014-04-17: 4 mg via INTRAVENOUS

## 2014-04-17 MED ORDER — HYDROMORPHONE HCL 1 MG/ML IJ SOLN
0.2500 mg | INTRAMUSCULAR | Status: DC | PRN
  Administered 2014-04-17: 0.25 mg via INTRAVENOUS

## 2014-04-17 MED ORDER — MIDAZOLAM HCL 2 MG/2ML IJ SOLN
INTRAMUSCULAR | Status: AC
  Filled 2014-04-17: qty 2

## 2014-04-17 MED ORDER — PHENYLEPHRINE HCL 10 MG/ML IJ SOLN
INTRAMUSCULAR | Status: DC | PRN
  Administered 2014-04-17 (×2): 40 ug via INTRAVENOUS

## 2014-04-17 MED ORDER — VECURONIUM BROMIDE 10 MG IV SOLR
INTRAVENOUS | Status: AC
  Filled 2014-04-17: qty 10

## 2014-04-17 MED ORDER — PROPOFOL 10 MG/ML IV BOLUS
INTRAVENOUS | Status: AC
  Filled 2014-04-17: qty 20

## 2014-04-17 MED ORDER — NEOSTIGMINE METHYLSULFATE 10 MG/10ML IV SOLN
INTRAVENOUS | Status: AC
  Filled 2014-04-17: qty 1

## 2014-04-17 MED ORDER — MAGNESIUM HYDROXIDE 400 MG/5ML PO SUSP
30.0000 mL | Freq: Every day | ORAL | Status: DC
  Administered 2014-04-18: 30 mL via ORAL
  Filled 2014-04-17 (×3): qty 30

## 2014-04-17 MED ORDER — FENTANYL CITRATE 0.05 MG/ML IJ SOLN
INTRAMUSCULAR | Status: AC
  Filled 2014-04-17: qty 5

## 2014-04-17 MED ORDER — HEMOSTATIC AGENTS (NO CHARGE) OPTIME
TOPICAL | Status: DC | PRN
  Administered 2014-04-17 (×2): 1 via TOPICAL

## 2014-04-17 MED ORDER — GUAIFENESIN ER 600 MG PO TB12
600.0000 mg | ORAL_TABLET | Freq: Two times a day (BID) | ORAL | Status: DC
  Administered 2014-04-17 – 2014-04-19 (×4): 600 mg via ORAL
  Filled 2014-04-17 (×5): qty 1

## 2014-04-17 MED ORDER — SUCCINYLCHOLINE CHLORIDE 20 MG/ML IJ SOLN
INTRAMUSCULAR | Status: AC
  Filled 2014-04-17: qty 1

## 2014-04-17 MED ORDER — MENTHOL 3 MG MT LOZG: 1.0000 | LOZENGE | OROMUCOSAL | Status: DC | PRN

## 2014-04-17 MED ORDER — CARVEDILOL 12.5 MG PO TABS
12.5000 mg | ORAL_TABLET | Freq: Two times a day (BID) | ORAL | Status: DC
Start: 2014-04-17 — End: 2014-04-19
  Administered 2014-04-17 – 2014-04-19 (×4): 12.5 mg via ORAL
  Filled 2014-04-17 (×6): qty 1

## 2014-04-17 MED ORDER — ACETAMINOPHEN 325 MG PO TABS: 650.0000 mg | ORAL_TABLET | ORAL | Status: DC | PRN

## 2014-04-17 MED ORDER — DOCUSATE SODIUM 100 MG PO CAPS
100.0000 mg | ORAL_CAPSULE | Freq: Two times a day (BID) | ORAL | Status: DC
  Administered 2014-04-17 – 2014-04-19 (×4): 100 mg via ORAL
  Filled 2014-04-17 (×5): qty 1

## 2014-04-17 MED ORDER — LIDOCAINE HCL (CARDIAC) 20 MG/ML IV SOLN
INTRAVENOUS | Status: DC | PRN
  Administered 2014-04-17: 20 mg via INTRAVENOUS

## 2014-04-17 MED ORDER — HYDROCODONE-ACETAMINOPHEN 5-325 MG PO TABS
1.0000 | ORAL_TABLET | ORAL | Status: DC | PRN
  Administered 2014-04-18 (×2): 1 via ORAL
  Administered 2014-04-19: 2 via ORAL
  Filled 2014-04-17: qty 2
  Filled 2014-04-17 (×2): qty 1

## 2014-04-17 MED ORDER — LIDOCAINE HCL (CARDIAC) 20 MG/ML IV SOLN
INTRAVENOUS | Status: AC
Start: 2014-04-17 — End: 2014-04-17
  Filled 2014-04-17: qty 5

## 2014-04-17 MED ORDER — FLUTICASONE PROPIONATE 50 MCG/ACT NA SUSP
2.0000 | Freq: Every day | NASAL | Status: DC
  Filled 2014-04-17: qty 16

## 2014-04-17 MED ORDER — BACITRACIN ZINC 500 UNIT/GM EX OINT
TOPICAL_OINTMENT | CUTANEOUS | Status: DC | PRN
  Administered 2014-04-17: 1 via TOPICAL

## 2014-04-17 MED ORDER — NEOSTIGMINE METHYLSULFATE 10 MG/10ML IV SOLN
INTRAVENOUS | Status: DC | PRN
  Administered 2014-04-17: 4 mg via INTRAVENOUS

## 2014-04-17 MED ORDER — GABAPENTIN 300 MG PO CAPS
300.0000 mg | ORAL_CAPSULE | Freq: Three times a day (TID) | ORAL | Status: DC
  Administered 2014-04-17 – 2014-04-19 (×6): 300 mg via ORAL
  Filled 2014-04-17 (×8): qty 1

## 2014-04-17 MED ORDER — OXYCODONE HCL 5 MG/5ML PO SOLN: 5.0000 mg | Freq: Once | ORAL | Status: DC | PRN

## 2014-04-17 MED ORDER — HYDRALAZINE HCL 25 MG PO TABS
25.0000 mg | ORAL_TABLET | Freq: Three times a day (TID) | ORAL | Status: DC
  Administered 2014-04-17 – 2014-04-19 (×6): 25 mg via ORAL
  Filled 2014-04-17 (×9): qty 1

## 2014-04-17 SURGICAL SUPPLY — 65 items
APL SKNCLS STERI-STRIP NONHPOA (GAUZE/BANDAGES/DRESSINGS) ×1
BAG DECANTER FOR FLEXI CONT (MISCELLANEOUS) ×3 IMPLANT
BENZOIN TINCTURE PRP APPL 2/3 (GAUZE/BANDAGES/DRESSINGS) ×4 IMPLANT
BIT DRILL NEURO 2X3.1 SFT TUCH (MISCELLANEOUS) ×1 IMPLANT
BLADE SURG 15 STRL LF DISP TIS (BLADE) ×1 IMPLANT
BLADE SURG 15 STRL SS (BLADE) ×3
BLADE ULTRA TIP 2M (BLADE) ×3 IMPLANT
BRUSH SCRUB EZ PLAIN DRY (MISCELLANEOUS) ×3 IMPLANT
BUR BARREL STRAIGHT FLUTE 4.0 (BURR) ×3 IMPLANT
BUR MATCHSTICK NEURO 3.0 LAGG (BURR) ×3 IMPLANT
CANISTER SUCT 3000ML (MISCELLANEOUS) ×3 IMPLANT
CLOSURE WOUND 1/2 X4 (GAUZE/BANDAGES/DRESSINGS) ×1
CONT SPEC 4OZ CLIKSEAL STRL BL (MISCELLANEOUS) ×3 IMPLANT
COVER MAYO STAND STRL (DRAPES) ×3 IMPLANT
DRAPE LAPAROTOMY 100X72 PEDS (DRAPES) ×3 IMPLANT
DRAPE MICROSCOPE LEICA (MISCELLANEOUS) ×2 IMPLANT
DRAPE POUCH INSTRU U-SHP 10X18 (DRAPES) ×3 IMPLANT
DRAPE PROXIMA HALF (DRAPES) ×4 IMPLANT
DRAPE SURG 17X23 STRL (DRAPES) ×6 IMPLANT
DRILL NEURO 2X3.1 SOFT TOUCH (MISCELLANEOUS) ×3
ELECT BLADE 4.0 EZ CLEAN MEGAD (MISCELLANEOUS) ×6
ELECT REM PT RETURN 9FT ADLT (ELECTROSURGICAL) ×3
ELECTRODE BLDE 4.0 EZ CLN MEGD (MISCELLANEOUS) IMPLANT
ELECTRODE REM PT RTRN 9FT ADLT (ELECTROSURGICAL) ×1 IMPLANT
GAUZE SPONGE 4X4 12PLY STRL (GAUZE/BANDAGES/DRESSINGS) ×3 IMPLANT
GAUZE SPONGE 4X4 16PLY XRAY LF (GAUZE/BANDAGES/DRESSINGS) IMPLANT
GLOVE BIO SURGEON STRL SZ8.5 (GLOVE) ×3 IMPLANT
GLOVE BIOGEL PI IND STRL 7.5 (GLOVE) IMPLANT
GLOVE BIOGEL PI INDICATOR 7.5 (GLOVE) ×4
GLOVE EXAM NITRILE LRG STRL (GLOVE) IMPLANT
GLOVE EXAM NITRILE MD LF STRL (GLOVE) IMPLANT
GLOVE EXAM NITRILE XL STR (GLOVE) IMPLANT
GLOVE EXAM NITRILE XS STR PU (GLOVE) IMPLANT
GLOVE INDICATOR 8.5 STRL (GLOVE) ×2 IMPLANT
GLOVE SS BIOGEL STRL SZ 8 (GLOVE) ×1 IMPLANT
GLOVE SUPERSENSE BIOGEL SZ 8 (GLOVE) ×2
GLOVE SURG SS PI 7.0 STRL IVOR (GLOVE) ×6 IMPLANT
GOWN STRL REUS W/ TWL LRG LVL3 (GOWN DISPOSABLE) IMPLANT
GOWN STRL REUS W/ TWL XL LVL3 (GOWN DISPOSABLE) ×1 IMPLANT
GOWN STRL REUS W/TWL LRG LVL3 (GOWN DISPOSABLE) ×9
GOWN STRL REUS W/TWL XL LVL3 (GOWN DISPOSABLE) ×3
KIT BASIN OR (CUSTOM PROCEDURE TRAY) ×3 IMPLANT
KIT ROOM TURNOVER OR (KITS) ×3 IMPLANT
MARKER SKIN DUAL TIP RULER LAB (MISCELLANEOUS) ×3 IMPLANT
NEEDLE HYPO 22GX1.5 SAFETY (NEEDLE) ×3 IMPLANT
NEEDLE SPNL 18GX3.5 QUINCKE PK (NEEDLE) ×3 IMPLANT
NS IRRIG 1000ML POUR BTL (IV SOLUTION) ×3 IMPLANT
PACK LAMINECTOMY NEURO (CUSTOM PROCEDURE TRAY) ×3 IMPLANT
PIN DISTRACTION 14MM (PIN) ×6 IMPLANT
PLATE ANT CERV XTEND 1 LV 12 (Plate) ×3 IMPLANT
PUTTY KINEX BIOACTIVE 2CC (Bone Implant) ×2 IMPLANT
RUBBERBAND STERILE (MISCELLANEOUS) ×4 IMPLANT
SCREW XTD VAR 4.2 SELF TAP (Screw) ×8 IMPLANT
SPONGE INTESTINAL PEANUT (DISPOSABLE) ×20 IMPLANT
SPONGE SURGIFOAM ABS GEL SZ50 (HEMOSTASIS) ×3 IMPLANT
STRIP CLOSURE SKIN 1/2X4 (GAUZE/BANDAGES/DRESSINGS) ×2 IMPLANT
SUT VIC AB 0 CT1 27 (SUTURE) ×3
SUT VIC AB 0 CT1 27XBRD ANTBC (SUTURE) ×1 IMPLANT
SUT VIC AB 3-0 SH 8-18 (SUTURE) ×3 IMPLANT
SYR 20ML ECCENTRIC (SYRINGE) ×3 IMPLANT
TAPE CLOTH SURG 4X10 WHT LF (GAUZE/BANDAGES/DRESSINGS) ×2 IMPLANT
TOWEL OR 17X24 6PK STRL BLUE (TOWEL DISPOSABLE) ×3 IMPLANT
TOWEL OR 17X26 10 PK STRL BLUE (TOWEL DISPOSABLE) ×3 IMPLANT
VISTA S 14X14X7 (Spacer) ×3 IMPLANT
WATER STERILE IRR 1000ML POUR (IV SOLUTION) ×3 IMPLANT

## 2014-04-17 NOTE — Anesthesia Preprocedure Evaluation (Addendum)
Anesthesia Evaluation  Patient identified by MRN, date of birth, ID band Patient awake    Reviewed: Allergy & Precautions, NPO status , Patient's Chart, lab work & pertinent test results  History of Anesthesia Complications Negative for: history of anesthetic complications  Airway Mallampati: III  TM Distance: >3 FB Neck ROM: Limited    Dental  (+) Dental Advisory Given, Poor Dentition, Missing, Chipped   Pulmonary neg pulmonary ROS,  breath sounds clear to auscultation        Cardiovascular hypertension, Pt. on medications + CAD, + Peripheral Vascular Disease and +CHF + dysrhythmias + Cardiac Defibrillator Rhythm:Regular Rate:Normal  EF 20-25%. Combined ischemic and non-ischemic cardiomyopathy. Chronically occluded RCA and 90% stenosis LCx.   Neuro/Psych Depression  Neuromuscular disease (CRPS)    GI/Hepatic Neg liver ROS, hiatal hernia, GERD-  Controlled,  Endo/Other  diabetes, Type 2, Oral Hypoglycemic Agents  Renal/GU CRFRenal disease     Musculoskeletal  (+) Arthritis -,   Abdominal   Peds  Hematology  (+) anemia ,   Anesthesia Other Findings   Reproductive/Obstetrics                          Anesthesia Physical Anesthesia Plan  ASA: IV  Anesthesia Plan: General   Post-op Pain Management:    Induction: Intravenous  Airway Management Planned: Oral ETT  Additional Equipment: Arterial line  Intra-op Plan:   Post-operative Plan: Extubation in OR  Informed Consent: I have reviewed the patients History and Physical, chart, labs and discussed the procedure including the risks, benefits and alternatives for the proposed anesthesia with the patient or authorized representative who has indicated his/her understanding and acceptance.   Dental advisory given  Plan Discussed with: CRNA  Anesthesia Plan Comments:         Anesthesia Quick Evaluation

## 2014-04-17 NOTE — Op Note (Signed)
Brief history: The patient is a 74 year old white male with multiple medical issues. I performed a C4-5 and C5-6 anterior cervical discectomy, fusion, and plating years ago. The patient has developed a progressive cervical myelopathy. He was worked up with a cervical myelo CT as he cannot have a MRI because he has a pacemaker. This demonstrated significant stenosis at C3-4. I discussed the various treatment options with the patient including surgery. He has weighed the risks, benefits, and alternatives surgery and decided proceed with a C3-4 anterior cervical discectomy, fusion, and plating.  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 Delma Officer  Asst.: Dr. Maeola Harman  Anesthesia: Gen. endotracheal  Estimated blood loss: 125 mL  Drains: None  Complications: None  Description of procedure: The patient was brought to the operating room by the anesthesia team. General endotracheal anesthesia was induced. A roll was placed under the patient's shoulders to keep the neck in the neutral position. The patient's anterior cervical region was then prepared with Betadine scrub and Betadine solution. Sterile drapes were applied.  The area to be incised was then injected with Marcaine with epinephrine solution. I then used a scalpel to make a transverse incision in the patient's left anterior neck. I used the Metzenbaum scissors to dissected through the scar tissue from the previous operation and to divide the platysmal muscle and then to carefully dissect medial to the sternocleidomastoid muscle, jugular vein, and  carotid artery. There was quite a bit of scar tissue from the prior operation. I carefully dissected down towards the anterior cervical spine identifying the esophagus and retracting it medially. Then using Kitner swabs to clear soft tissue from the anterior cervical spine. We then inserted a bent spinal needle into the upper exposed intervertebral disc space. We initially were low on the anterior cervical plate. I then dissected more cephalad, above the omohyoid muscle. We exposed the upper aspect of the plate and carefully dissected the esophagus away from the anterior aspect of the plate and anterior cervical spine. We then obtained intraoperative radiographs confirm our location.  There was a large anterior osteophyte at C3-4. I then used electrocautery to detach the medial border of the longus colli muscle bilaterally from the C3-4 intervertebral disc spaces. I then inserted the Caspar self-retaining retractor underneath the longus colli muscle bilaterally to provide exposure. I used a high-speed drill to drill away some of this large anterior osteophyte to gain access to the disc space.  We then incised the intervertebral disc at T3-4. We then performed a partial intervertebral discectomy with a pituitary forceps and the Karlin curettes. I then inserted distraction screws into the vertebral bodies at C3 and C4. We then distracted the interspace. We then used the high-speed drill to decorticate the vertebral endplates at C3-4, to drill away the remainder of the intervertebral disc, to drill away some posterior spondylosis, and to thin out the posterior longitudinal ligament. I then incised ligament with the arachnoid knife. We then removed the ligament with a Kerrison punches undercutting the vertebral endplates and decompressing the thecal sac. We then performed foraminotomies about the bilateral C4 nerve roots. This completed the decompression at this level.  We now turned our to attention to the  interbody fusion. We  used the trial spacers to determine the appropriate size for the interbody prosthesis. We then pre-filled prosthesis with a combination of local morcellized autograft bone that we obtained during decompression as well as Kinnex bone graft extender. We then inserted the prosthesis into the distracted interspace at C3-4. We then removed the distraction screws. There was a good snug fit of the prosthesis in the interspace.  Having completed the fusion we now turned attention to the anterior spinal instrumentation. We used the high-speed drill to drill away some anterior spondylosis at the disc spaces so that the plate lay down flat. There was enough room at C3-4 to place a plate adjacent to the old plate without removing the old plate. We selected the appropriate length titanium anterior cervical plate. We laid it along the anterior aspect of the vertebral bodies from C3 and C4. We then drilled 14 mm holes at E3 and C4. We then secured the plate to the vertebral bodies by placing two 14 mm self-tapping screws at C3 and C4. We then obtained intraoperative radiograph. The demonstrating good position of the instrumentation. The plate was riding slightly higher on the vertebral body at C3 but posterior to the large anterior osteophyte. We therefore secured the screws the plate the locking each cam. This completed the instrumentation.  We then obtained hemostasis using bipolar electrocautery. We irrigated the wound out with bacitracin solution. We then removed the retractor. We inspected the esophagus for any damage. There was none apparent. We then reapproximated patient's platysmal muscle with interrupted 3-0 Vicryl suture. We then reapproximated the subcutaneous tissue with interrupted 3-0 Vicryl suture. The skin was reapproximated with Steri-Strips and benzoin. The wound was then covered with bacitracin ointment. A sterile dressing was applied. The drapes were removed. Patient was subsequently  extubated by the anesthesia team and transported to the post anesthesia care unit in stable condition. All sponge instrument and needle counts were reportedly correct at the end of this case.

## 2014-04-17 NOTE — Progress Notes (Signed)
Patient ID: [name], male   DOB: 02-04-1941, 74 y.o.   MRN: [id number] Subjective:  The patient is alert and pleasant. He looks well. He is in no apparent distress.  Objective: Vital signs in last 24 hours: Temp:  [97.4 F (36.3 C)-98.8 F (37.1 C)] 98.1 F (36.7 C) (01/14 1430) Pulse Rate:  [59-83] 80 (01/14 1700) Resp:  [8-20] 10 (01/14 1700) BP: (110-144)/(50-79) 144/66 mmHg (01/14 1700) SpO2:  [89 %-100 %] 100 % (01/14 1700) Arterial Line BP: (105-150)/(42-57) 150/55 mmHg (01/14 1700)  Intake/Output from previous day:   Intake/Output this shift: Total I/O In: 1740 [P.O.:240; I.V.:1500] Out: 100 [Blood:100]  Physical exam the patient's dressing has a small bloodstained. There is no hematoma or shift. He is moving all 4 extremities well.  Lab Results: No results for input(s): WBC, HGB, HCT, PLT in the last 72 hours. BMET No results for input(s): NA, K, CL, CO2, GLUCOSE, BUN, CREATININE, CALCIUM in the last 72 hours.  Studies/Results: Dg Cervical Spine Complete  04/17/2014   CLINICAL DATA:  C3-4 anterior discectomy and fusion  EXAM: CERVICAL SPINE  4+ VIEWS  COMPARISON:  CT cervical spine of 02/04/2014  FINDINGS: On film labeled 1, a needle is position anteriorly directed toward the C6 vertebral body.  On the next film, a needle is positioned anteriorly directed toward the anterior upper aspect of C4 vertebral body.  On film labeled 3, an instrument is directed toward the C3-4 interspace.  On the last film, anterior fusion has been performed at C3-4. Prior fusion from C4-C6 is again noted.  IMPRESSION: Anterior fusion at C3-4.   Electronically Signed   By: Dwyane Dee M.D.   On: 04/17/2014 11:44    Assessment/Plan: The patient is doing well.  LOS: 0 days     Lavere Stork D 04/17/2014, 6:25 PM

## 2014-04-17 NOTE — Transfer of Care (Signed)
4Immediate Anesthesia Transfer of Care Note  Patient: [name]  Procedure(s) Performed: Procedure(s) with comments: CERVICAL THREE TO FOUR ANTERIOR CERVICAL DECOMPRESSION/DISCECTOMY FUSION 1 LEVEL/HARDWARE REMOVAL (N/A) - C34 anterior cervical decompression with fusion interbody prosthesis plating and bonegraft with exploration of prev fusion and possible removal of old hardware  Patient Location: PACU  Anesthesia Type:General  Level of Consciousness: awake, alert  and oriented  Airway & Oxygen Therapy: Patient Spontanous Breathing and Patient connected to nasal cannula oxygen  Post-op Assessment: Report given to PACU RN and Post -op Vital signs reviewed and stable  Post vital signs: Reviewed and stable  Complications: No apparent anesthesia complications

## 2014-04-17 NOTE — Anesthesia Procedure Notes (Signed)
Procedure Name: Intubation Date/Time: 04/17/2014 7:57 AM Performed by: Lovie Chol Pre-anesthesia Checklist: Patient identified, Emergency Drugs available, Suction available, Patient being monitored and Timeout performed Patient Re-evaluated:Patient Re-evaluated prior to inductionOxygen Delivery Method: Circle system utilized Preoxygenation: Pre-oxygenation with 100% oxygen Intubation Type: IV induction Ventilation: Mask ventilation without difficulty Grade View: Grade I Tube type: Oral Tube size: 7.5 mm Number of attempts: 1 Airway Equipment and Method: Stylet and Video-laryngoscopy Placement Confirmation: positive ETCO2,  CO2 detector and breath sounds checked- equal and bilateral Secured at: 22 cm Tube secured with: Tape Dental Injury: Teeth and Oropharynx as per pre-operative assessment  Difficulty Due To: Difficulty was anticipated, Difficult Airway- due to dentition, Difficult Airway- due to limited oral opening, Difficult Airway- due to reduced neck mobility and Difficult Airway-  due to neck instability

## 2014-04-17 NOTE — Anesthesia Postprocedure Evaluation (Signed)
  Anesthesia Post-op Note  Patient: [name]  Procedure(s) Performed: Procedure(s) with comments: CERVICAL THREE TO FOUR ANTERIOR CERVICAL DECOMPRESSION/DISCECTOMY FUSION 1 LEVEL/HARDWARE REMOVAL (N/A) - C34 anterior cervical decompression with fusion interbody prosthesis plating and bonegraft with exploration of prev fusion and possible removal of old hardware  Patient Location: PACU  Anesthesia Type:General  Level of Consciousness: awake and alert   Airway and Oxygen Therapy: Patient Spontanous Breathing  Post-op Pain: none  Post-op Assessment: Post-op Vital signs reviewed  Post-op Vital Signs: Reviewed  Last Vitals:  Filed Vitals:   04/17/14 1700  BP: 144/66  Pulse: 80  Temp:   Resp: 10    Complications: No apparent anesthesia complications

## 2014-04-17 NOTE — H&P (Signed)
Subjective: The patient is a 74 year old white male on whom I performed a 2 level anterior cervical discectomy, fusion, plating a few years ago. He has developed recurrent neck and arm pain consistent with a cervical radiculopathy/myelopathy. He was worked up with a cervical myelo CT as he has a pacemaker. This demonstrates stenosis at C3-4. I discussed the various treatment options including surgery. He has decided to proceed with surgery.   Past Medical History  Diagnosis Date  . Hypertension   . Hyperlipidemia   . Cervical spinal stenosis   . Lumbar spinal stenosis   . 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 DVT (deep venous thrombosis) 10/2011    RUE; pt denies this hx on 2013/05/21  . Anemia   . Cardiomyopathy     a. 10/2012 Echo: EF 20-25%, diff HK, mod dil LA/RV/RA.  Marland Kitchen CAD (coronary artery disease)   . Atrial flutter   . CKD (chronic kidney disease), stage III   . Venous insufficiency   . Frequent falls   . 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 Chronic systolic congestive heart failure   . Quadriparesis 08-10-2011    pre op  . Hypoglycemia   . Cervical myelopathy   . Acute encephalopathy   . Mononeuritis of unspecified site   . Dysrhythmia   . H/O hiatal hernia   . Arthritis     "all over" (05/21/13)  . Depression     "son died in service in Aug 10, 1990; still bothers me" (May 21, 2013)  . AICD (automatic cardioverter/defibrillator) present   . Shortness of breath dyspnea     with exertion  . Pneumonia 1960    , also 2014  . Type II diabetes mellitus     type 2  . GERD (gastroesophageal reflux disease)     Past Surgical History  Procedure Laterality Date  . Total hip arthroplasty Right ~ Aug 10, 2010  . 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 Anterior cervical decomp/discectomy fusion  10/31/2011    Procedure: ANTERIOR CERVICAL DECOMPRESSION/DISCECTOMY FUSION 2 LEVELS;  Surgeon: Cristi Loron, MD;  Location: [facility];  Service: Neurosurgery;  Laterality: N/A;  Cervical Four-Five Cervical Five-Six Anterior Cervical Decompression with fusion interbody prothesis plating and bonegraft  . Carpal tunnel release Right ~ Aug 10, 2011  . Bi-ventricular implantable cardioverter defibrillator  (crt-d) Left 05-21-2013    STJ CRTD implanted by Dr Ladona Ridgel for primary prevention/CHF  . Cataract extraction w/ intraocular lens implant Bilateral   . Cardiac catheterization  10/2012  . Left and right heart catheterization with coronary angiogram N/A 11/06/2012    Procedure: LEFT AND RIGHT HEART CATHETERIZATION WITH CORONARY ANGIOGRAM;  Surgeon: Laurey Morale, MD;  Location: [facility] CATH LAB;  Service: Cardiovascular;  Laterality: N/A;  . Bi-ventricular implantable cardioverter defibrillator N/A 2013-05-21    Procedure: BI-VENTRICULAR IMPLANTABLE CARDIOVERTER DEFIBRILLATOR  (CRT-D);  Surgeon: Marinus Maw, MD;  Location: [facility] CATH LAB;  Service: Cardiovascular;  Laterality: N/A;  . Joint replacement  08-10-2011    Allergies  Allergen Reactions  . Dristan Cold [Chlorphen-Pe-Acetaminophen] Anxiety    Makes pt feel "wired up"  . Prednisone Other (See Comments)    Messes with blood sugar levels     History  Substance Use Topics  . Smoking status: Never Smoker   . Smokeless tobacco: Never Used  . Alcohol Use: No     Comment: 05-21-13 "used to drink a little bit a long time  ago; nothing in over 30 yrs"    Family History  Problem Relation Age of Onset  . Diabetes Brother   . Cancer Father     Lung cancer   Prior to Admission medications   Medication Sig Start Date End Date Taking? Authorizing Provider  acarbose (PRECOSE) 50 MG tablet Take 50 mg by mouth 3 (three) times daily with meals. Take 1 tablet 1-3 times a day   Yes Historical Provider, MD  aspirin 81 MG chewable tablet Chew 81 mg by mouth daily.   Yes Historical Provider, MD  carvedilol (COREG) 12.5 MG tablet Take 1 tablet (12.5 mg total) by mouth 2 (two) times daily with a meal.  04/14/14  Yes Amy D Clegg, NP  docusate sodium (COLACE) 100 MG capsule Take 300 mg by mouth 2 (two) times daily.    Yes Historical Provider, MD  fluticasone (FLONASE) 50 MCG/ACT nasal spray Place 2 sprays into the nose daily as needed.    Yes Historical Provider, MD  furosemide (LASIX) 40 MG tablet Take 1 tablet (40 mg total) by mouth every other day. 04/14/14  Yes Amy D Clegg, NP  gabapentin (NEURONTIN) 300 MG capsule Take 300 mg by mouth 3 (three) times daily. 11/09/12  Yes Christiane Ha, MD  glimepiride (AMARYL) 1 MG tablet Take 1 mg by mouth 2 (two) times daily before a meal.   Yes Historical Provider, MD  guaiFENesin (MUCINEX) 600 MG 12 hr tablet Take 600 mg by mouth 2 (two) times daily as needed.    Yes Historical Provider, MD  hydrALAZINE (APRESOLINE) 25 MG tablet TAKE ONE-HALF TABLET BY MOUTH THREE TIMES DAILY   Yes Dolores Patty, MD  HYDROcodone-acetaminophen (NORCO/VICODIN) 5-325 MG per tablet Take 1 tablet by mouth every 4 (four) hours as needed for severe pain. 11/30/12  Yes Tiffany L Reed, DO  isosorbide mononitrate (IMDUR) 30 MG 24 hr tablet TAKE ONE TABLET BY MOUTH ONCE DAILY 11/25/13  Yes Dolores Patty, MD  ivabradine HCl (CORLANOR) 5 MG TABS tablet Take 1 tablet (5 mg total) by mouth 2 (two) times daily with a meal. 01/06/14  Yes Laurey Morale, MD  metFORMIN (GLUCOPHAGE) 500 MG tablet Take 500 mg by mouth 2 (two) times daily with a meal.    Yes Historical Provider, MD  pravastatin (PRAVACHOL) 40 MG tablet Take 1 tablet (40 mg total) by mouth at bedtime. 01/31/13  Yes Sharon Seller, NP  magnesium hydroxide (MILK OF MAGNESIA) 400 MG/5ML suspension Take 30 mL by mouth daily as needed for constipation.    Historical Provider, MD  phenylephrine (NEO-SYNEPHRINE) 0.25 % nasal spray Place 1 spray into both nostrils every 6 (six) hours as needed for congestion.    Historical Provider, MD     Review of Systems  Positive ROS: As above  All other systems have been reviewed and  were otherwise negative with the exception of those mentioned in the HPI and as above.  Objective: Vital signs in last 24 hours: Temp:  [97.4 F (36.3 C)] 97.4 F (36.3 C) (01/14 0700) Pulse Rate:  [71] 71 (01/14 0700) BP: (141)/(79) 141/79 mmHg (01/14 0700) SpO2:  [100 %] 100 % (01/14 0700)  General Appearance: Alert, cooperative, no distress, Head: Normocephalic, without obvious abnormality, atraumatic Eyes: PERRL, conjunctiva/corneas clear, EOM's intact,    Ears: Normal  Throat: Normal  Neck: Supple, symmetrical, trachea midline, no adenopathy; thyroid: No enlargement/tenderness/nodules; no carotid bruit or JVD, his cervical incision is well-healed. Back: Symmetric, no curvature, ROM  normal, no CVA tenderness Lungs: Clear to auscultation bilaterally, respirations unlabored Heart: Regular rate and rhythm, no murmur, rub or gallop Abdomen: Soft, non-tender,, no masses, no organomegaly Extremities: Extremities normal, atraumatic, no cyanosis or edema Pulses: 2+ and symmetric all extremities Skin: Skin color, texture, turgor normal, no rashes or lesions  NEUROLOGIC:   Mental status: alert and oriented, no aphasia, good attention span, Fund of knowledge/ memory ok Motor Exam - grossly normal Sensory Exam - grossly normal Reflexes:  Coordination -  Gait - unsteady Balance - unsteady Cranial Nerves: I: smell Not tested  II: visual acuity  OS: Normal  OD: Normal   II: visual fields Full to confrontation  II: pupils Equal, round, reactive to light  III,VII: ptosis None  III,IV,VI: extraocular muscles  Full ROM  V: mastication Normal  V: facial light touch sensation  Normal  V,VII: corneal reflex  Present  VII: facial muscle function - upper  Normal  VII: facial muscle function - lower Normal  VIII: hearing Not tested  IX: soft palate elevation  Normal  IX,X: gag reflex Present  XI: trapezius strength  5/5  XI: sternocleidomastoid strength 5/5  XI: neck flexion strength   5/5  XII: tongue strength  Normal    Data Review Lab Results  Component Value Date   WBC 11.9* 04/10/2014   HGB 12.8* 04/10/2014   HCT 38.2* 04/10/2014   MCV 95.3 04/10/2014   PLT 216 04/10/2014   Lab Results  Component Value Date   NA 138 04/14/2014   K 4.7 04/14/2014   CL 105 04/14/2014   CO2 24 04/14/2014   BUN 22 04/14/2014   CREATININE 1.20 04/14/2014   GLUCOSE 124* 04/14/2014   Lab Results  Component Value Date   INR 1.51* 10/29/2012    Assessment/Plan: C3-4 disc degeneration, spondylosis, stenosis, cervicalgia, cervical radiculopathy, cervical myelopathy: I have discussed the situation with the patient and his wife. I have reviewed the myelo CT with them and pointed out the abnormalities. We have discussed the various treatment options including surgery. I have described the surgical treatment option of a C3-4 anterior cervical discectomy, fusion, and plating with possible removal of his old instrumentation. I have shown him surgical models. We have discussed the risks, benefits, alternatives, and likelihood of achieving her goals with surgery. I have answered all his questions. He has decided to proceed with surgery.   [name] D 04/17/2014 7:20 AM

## 2014-04-18 DIAGNOSIS — D649 Anemia, unspecified: Secondary | ICD-10-CM | POA: Diagnosis not present

## 2014-04-18 DIAGNOSIS — Z79899 Other long term (current) drug therapy: Secondary | ICD-10-CM | POA: Diagnosis not present

## 2014-04-18 DIAGNOSIS — Z888 Allergy status to other drugs, medicaments and biological substances status: Secondary | ICD-10-CM | POA: Diagnosis not present

## 2014-04-18 DIAGNOSIS — G90521 Complex regional pain syndrome I of right lower limb: Secondary | ICD-10-CM | POA: Diagnosis not present

## 2014-04-18 DIAGNOSIS — G8250 Quadriplegia, unspecified: Secondary | ICD-10-CM | POA: Diagnosis not present

## 2014-04-18 DIAGNOSIS — Z95810 Presence of automatic (implantable) cardiac defibrillator: Secondary | ICD-10-CM | POA: Diagnosis not present

## 2014-04-18 DIAGNOSIS — E119 Type 2 diabetes mellitus without complications: Secondary | ICD-10-CM | POA: Diagnosis not present

## 2014-04-18 DIAGNOSIS — K219 Gastro-esophageal reflux disease without esophagitis: Secondary | ICD-10-CM | POA: Diagnosis not present

## 2014-04-18 DIAGNOSIS — I429 Cardiomyopathy, unspecified: Secondary | ICD-10-CM | POA: Diagnosis not present

## 2014-04-18 DIAGNOSIS — G90511 Complex regional pain syndrome I of right upper limb: Secondary | ICD-10-CM | POA: Diagnosis not present

## 2014-04-18 DIAGNOSIS — E785 Hyperlipidemia, unspecified: Secondary | ICD-10-CM | POA: Diagnosis not present

## 2014-04-18 DIAGNOSIS — F329 Major depressive disorder, single episode, unspecified: Secondary | ICD-10-CM | POA: Diagnosis not present

## 2014-04-18 DIAGNOSIS — I4892 Unspecified atrial flutter: Secondary | ICD-10-CM | POA: Diagnosis not present

## 2014-04-18 DIAGNOSIS — I2510 Atherosclerotic heart disease of native coronary artery without angina pectoris: Secondary | ICD-10-CM | POA: Diagnosis not present

## 2014-04-18 DIAGNOSIS — I129 Hypertensive chronic kidney disease with stage 1 through stage 4 chronic kidney disease, or unspecified chronic kidney disease: Secondary | ICD-10-CM | POA: Diagnosis not present

## 2014-04-18 DIAGNOSIS — M4712 Other spondylosis with myelopathy, cervical region: Secondary | ICD-10-CM | POA: Diagnosis not present

## 2014-04-18 DIAGNOSIS — N183 Chronic kidney disease, stage 3 (moderate): Secondary | ICD-10-CM | POA: Diagnosis not present

## 2014-04-18 LAB — GLUCOSE, CAPILLARY
GLUCOSE-CAPILLARY: 173 mg/dL — AB (ref 70–99)
GLUCOSE-CAPILLARY: 174 mg/dL — AB (ref 70–99)
GLUCOSE-CAPILLARY: 176 mg/dL — AB (ref 70–99)
Glucose-Capillary: 168 mg/dL — ABNORMAL HIGH (ref 70–99)
Glucose-Capillary: 191 mg/dL — ABNORMAL HIGH (ref 70–99)
Glucose-Capillary: 132 mg/dL — ABNORMAL HIGH (ref 70–99)

## 2014-04-18 MED ORDER — PANTOPRAZOLE SODIUM 40 MG PO TBEC
40.0000 mg | DELAYED_RELEASE_TABLET | Freq: Every day | ORAL | Status: DC
  Administered 2014-04-18: 40 mg via ORAL
  Filled 2014-04-18: qty 1

## 2014-04-18 MED ORDER — CETYLPYRIDINIUM CHLORIDE 0.05 % MT LIQD
7.0000 mL | Freq: Two times a day (BID) | OROMUCOSAL | Status: DC
  Administered 2014-04-18: 7 mL via OROMUCOSAL

## 2014-04-18 NOTE — Evaluation (Signed)
Physical Therapy Evaluation Patient Details Name: [name] MRN: [id number] DOB: 1940-08-21 Today's Date: 04/18/2014   History of Present Illness  The patient is a 74 year old white male with prior 2 level anterior cervical discectomy C4-6 fusion, plating a few years ago. He has developed recurrent neck and arm pain consistent with a cervical radiculopathy/myelopathy. He was worked up with a cervical myelo CT as he has a pacemaker. This demonstrates stenosis at C3-4 s/p ACDF C3-4 1/14  Clinical Impression  Pt pleasant but decreased awareness of safety, precautions and limited adherence to cues and education during session. Pt will benefit from acute therapy to maximize mobility, function, strength and adherence to precautions. Recommend daily ambulation with staff and pt educated for lifting, ROM and bil UE restrictions. Will follow. Concerned about pt home as he states the back porch recently fell off and is in disrepair.     Follow Up Recommendations Home health PT;Supervision for mobility/OOB    Equipment Recommendations  None recommended by PT    Recommendations for Other Services OT consult     Precautions / Restrictions Precautions Precautions: Cervical;Fall Restrictions Weight Bearing Restrictions: No      Mobility  Bed Mobility Overal bed mobility: Needs Assistance Bed Mobility: Supine to Sit     Supine to sit: Min assist     General bed mobility comments: max cues for rolling and side to sit. Pt bending knees then grasping thighs to pull to sitting despite max multimodal cues with pt ignored  Transfers Overall transfer level: Needs assistance   Transfers: Sit to/from Stand Sit to Stand: Min guard         General transfer comment: cues for hand placement and control of descent as pt reaching for rW to stand  Ambulation/Gait Ambulation/Gait assistance: Supervision Ambulation Distance (Feet): 160 Feet Assistive device: Rolling walker (2 wheeled) Gait  Pattern/deviations: Trunk flexed;Shuffle   Gait velocity interpretation: Below normal speed for age/gender General Gait Details: mod cues for posture and position in RW  Stairs            Wheelchair Mobility    Modified Rankin (Stroke Patients Only)       Balance Overall balance assessment: Needs assistance   Sitting balance-Leahy Scale: Fair       Standing balance-Leahy Scale: Poor                               Pertinent Vitals/Pain Pain Assessment: No/denies pain  HR 81 sats 99% RA    Home Living Family/patient expects to be discharged to:: Private residence Living Arrangements: Alone Available Help at Discharge: Family Type of Home: House Home Access: Stairs to enter Entrance Stairs-Rails: None Entrance Stairs-Number of Steps: 3 Home Layout: One level;Able to live on main level with bedroom/bathroom Home Equipment: Walker - 4 wheels;Bedside commode;Wheelchair - manual;Hospital bed      Prior Function Level of Independence: Independent with assistive device(s)         Comments: pt states he furniture walks in the house, uses walker outside, sleeps in a hospital bed and has assist at times for getting OOB and donning socks     Hand Dominance        Extremity/Trunk Assessment   Upper Extremity Assessment: Overall WFL for tasks assessed (did not assess shoulder strength/ROM secondary to precautions, numbness all digits distally)           Lower Extremity Assessment: Overall WFL for tasks assessed  Cervical / Trunk Assessment: Kyphotic;Other exceptions  Communication   Communication: HOH  Cognition Arousal/Alertness: Awake/alert Behavior During Therapy: WFL for tasks assessed/performed Overall Cognitive Status: Impaired/Different from baseline Area of Impairment: Safety/judgement     Memory: Decreased recall of precautions   Safety/Judgement: Decreased awareness of safety          General Comments       Exercises        Assessment/Plan    PT Assessment Patient needs continued PT services  PT Diagnosis Difficulty walking;Generalized weakness   PT Problem List Decreased strength;Decreased activity tolerance;Decreased balance;Decreased knowledge of use of DME;Decreased mobility  PT Treatment Interventions Stair training;Gait training;DME instruction;Functional mobility training;Therapeutic activities;Patient/family education   PT Goals (Current goals can be found in the Care Plan section) Acute Rehab PT Goals Patient Stated Goal: return home PT Goal Formulation: With patient Time For Goal Achievement: 04/25/14 Potential to Achieve Goals: Fair    Frequency Min 5X/week   Barriers to discharge Decreased caregiver support      Co-evaluation               End of Session Equipment Utilized During Treatment: Gait belt Activity Tolerance: Patient tolerated treatment well Patient left: in chair;with call bell/phone within reach Nurse Communication: Mobility status;Precautions         Time: 1610-9604 PT Time Calculation (min) (ACUTE ONLY): 21 min   Charges:   PT Evaluation $Initial PT Evaluation Tier I: 1 Procedure PT Treatments $Therapeutic Activity: 8-22 mins   PT G CodesDelorse Lek 04/18/2014, 11:41 AM Delaney Meigs, PT [phone]

## 2014-04-18 NOTE — Progress Notes (Signed)
Patient ID: [name], male   DOB: 03/26/1941, 74 y.o.   MRN: [id number] Subjective:  The patient is alert and pleasant. He looks well. He is in no apparent distress.  Objective: Vital signs in last 24 hours: Temp:  [97.4 F (36.3 C)-98.8 F (37.1 C)] 97.6 F (36.4 C) (01/15 0756) Pulse Rate:  [50-86] 76 (01/15 0700) Resp:  [6-20] 8 (01/15 0700) BP: (110-153)/(50-92) 143/70 mmHg (01/15 0700) SpO2:  [89 %-100 %] 97 % (01/15 0700) Arterial Line BP: (105-159)/(42-75) 159/58 mmHg (01/15 0700)  Intake/Output from previous day: 01/14 0701 - 01/15 0700 In: 2283.8 [P.O.:240; I.V.:1943.8; IV Piggyback:100] Out: 975 [Urine:875; Blood:100] Intake/Output this shift:    Physical exam the patient is alert and oriented 3. He is moving all 4 extremities well. His dressing is clean and dry, but falling off. There is no hematoma or shift.  Lab Results: No results for input(s): WBC, HGB, HCT, PLT in the last 72 hours. BMET No results for input(s): NA, K, CL, CO2, GLUCOSE, BUN, CREATININE, CALCIUM in the last 72 hours.  Studies/Results: Dg Cervical Spine Complete  04/17/2014   CLINICAL DATA:  C3-4 anterior discectomy and fusion  EXAM: CERVICAL SPINE  4+ VIEWS  COMPARISON:  CT cervical spine of 02/04/2014  FINDINGS: On film labeled 1, a needle is position anteriorly directed toward the C6 vertebral body.  On the next film, a needle is positioned anteriorly directed toward the anterior upper aspect of C4 vertebral body.  On film labeled 3, an instrument is directed toward the C3-4 interspace.  On the last film, anterior fusion has been performed at C3-4. Prior fusion from C4-C6 is again noted.  IMPRESSION: Anterior fusion at C3-4.   Electronically Signed   By: Dwyane Dee M.D.   On: 04/17/2014 11:44    Assessment/Plan: Postop day #1: The patient is doing well. We will transferred to the floor. He will likely go home tomorrow. I gave him his discharge instructions and answered all his  questions.  LOS: 1 day     [name] D 04/18/2014, 7:57 AM

## 2014-04-18 NOTE — Progress Notes (Signed)
Pt admitted to 4N09 at 1345 from [facility]M ICU. Dx cervical spondylosis with myelopathy. Status post C3-4 anterior cervical Discectomy, fusion and plating. Vital signs taken, pain assessed and pain med given. Pt placed on telemetry Box 4N09.

## 2014-04-18 NOTE — Progress Notes (Signed)
UR Completed.  [phone]  

## 2014-04-19 DIAGNOSIS — Z79899 Other long term (current) drug therapy: Secondary | ICD-10-CM | POA: Diagnosis not present

## 2014-04-19 DIAGNOSIS — F329 Major depressive disorder, single episode, unspecified: Secondary | ICD-10-CM | POA: Diagnosis not present

## 2014-04-19 DIAGNOSIS — E785 Hyperlipidemia, unspecified: Secondary | ICD-10-CM | POA: Diagnosis not present

## 2014-04-19 DIAGNOSIS — M4712 Other spondylosis with myelopathy, cervical region: Secondary | ICD-10-CM | POA: Diagnosis not present

## 2014-04-19 DIAGNOSIS — I429 Cardiomyopathy, unspecified: Secondary | ICD-10-CM | POA: Diagnosis not present

## 2014-04-19 DIAGNOSIS — I4892 Unspecified atrial flutter: Secondary | ICD-10-CM | POA: Diagnosis not present

## 2014-04-19 DIAGNOSIS — G90521 Complex regional pain syndrome I of right lower limb: Secondary | ICD-10-CM | POA: Diagnosis not present

## 2014-04-19 DIAGNOSIS — G90511 Complex regional pain syndrome I of right upper limb: Secondary | ICD-10-CM | POA: Diagnosis not present

## 2014-04-19 DIAGNOSIS — I2510 Atherosclerotic heart disease of native coronary artery without angina pectoris: Secondary | ICD-10-CM | POA: Diagnosis not present

## 2014-04-19 DIAGNOSIS — D649 Anemia, unspecified: Secondary | ICD-10-CM | POA: Diagnosis not present

## 2014-04-19 DIAGNOSIS — G8250 Quadriplegia, unspecified: Secondary | ICD-10-CM | POA: Diagnosis not present

## 2014-04-19 DIAGNOSIS — E119 Type 2 diabetes mellitus without complications: Secondary | ICD-10-CM | POA: Diagnosis not present

## 2014-04-19 DIAGNOSIS — Z95810 Presence of automatic (implantable) cardiac defibrillator: Secondary | ICD-10-CM | POA: Diagnosis not present

## 2014-04-19 DIAGNOSIS — K219 Gastro-esophageal reflux disease without esophagitis: Secondary | ICD-10-CM | POA: Diagnosis not present

## 2014-04-19 DIAGNOSIS — Z888 Allergy status to other drugs, medicaments and biological substances status: Secondary | ICD-10-CM | POA: Diagnosis not present

## 2014-04-19 DIAGNOSIS — N183 Chronic kidney disease, stage 3 (moderate): Secondary | ICD-10-CM | POA: Diagnosis not present

## 2014-04-19 DIAGNOSIS — I129 Hypertensive chronic kidney disease with stage 1 through stage 4 chronic kidney disease, or unspecified chronic kidney disease: Secondary | ICD-10-CM | POA: Diagnosis not present

## 2014-04-19 LAB — GLUCOSE, CAPILLARY
Glucose-Capillary: 102 mg/dL — ABNORMAL HIGH (ref 70–99)
Glucose-Capillary: 114 mg/dL — ABNORMAL HIGH (ref 70–99)
Glucose-Capillary: 92 mg/dL (ref 70–99)

## 2014-04-19 NOTE — Discharge Summary (Signed)
Physician Discharge Summary  Patient ID: [name] MRN: [id number] DOB/AGE: [birth year] 74 y.o.  Admit date: 04/17/2014 Discharge date: 04/19/2014  Admission Diagnoses:c34 stenosis  Discharge Diagnoses:  Active Problems:   Cervical spondylosis with myelopathy   Discharged Condition: swallowing well. No pain  Hospital Course: surgery  Consults: none  Significant Diagnostic Studies: mri  Treatments: cervical fusion  Discharge Exam: Blood pressure 153/69, pulse 87, temperature 97.6 F (36.4 C), temperature source Oral, resp. rate 20, height 5\' 9"  (1.753 m), weight 85.3 kg (188 lb 0.8 oz), SpO2 99 %. ambulating  Disposition: 01-Home or Self Care     Medication List    ASK your doctor about these medications        acarbose 50 MG tablet  Commonly known as:  PRECOSE  Take 50 mg by mouth 3 (three) times daily with meals. Take 1 tablet 1-3 times a day     aspirin 81 MG chewable tablet  Chew 81 mg by mouth daily.     carvedilol 12.5 MG tablet  Commonly known as:  COREG  Take 1 tablet (12.5 mg total) by mouth 2 (two) times daily with a meal.     docusate sodium 100 MG capsule  Commonly known as:  COLACE  Take 300 mg by mouth 2 (two) times daily.     fluticasone 50 MCG/ACT nasal spray  Commonly known as:  FLONASE  Place 2 sprays into the nose daily as needed.     furosemide 40 MG tablet  Commonly known as:  LASIX  Take 1 tablet (40 mg total) by mouth every other day.     gabapentin 300 MG capsule  Commonly known as:  NEURONTIN  Take 300 mg by mouth 3 (three) times daily.     glimepiride 1 MG tablet  Commonly known as:  AMARYL  Take 1 mg by mouth 2 (two) times daily before a meal.     guaiFENesin 600 MG 12 hr tablet  Commonly known as:  MUCINEX  Take 600 mg by mouth 2 (two) times daily as needed.     hydrALAZINE 25 MG tablet  Commonly known as:  APRESOLINE  TAKE ONE-HALF TABLET BY MOUTH THREE TIMES DAILY     HYDROcodone-acetaminophen 5-325 MG per  tablet  Commonly known as:  NORCO/VICODIN  Take 1 tablet by mouth every 4 (four) hours as needed for severe pain.     isosorbide mononitrate 30 MG 24 hr tablet  Commonly known as:  IMDUR  TAKE ONE TABLET BY MOUTH ONCE DAILY     ivabradine 5 MG Tabs tablet  Commonly known as:  CORLANOR  Take 1 tablet (5 mg total) by mouth 2 (two) times daily with a meal.     magnesium hydroxide 400 MG/5ML suspension  Commonly known as:  MILK OF MAGNESIA  Take 30 mL by mouth daily as needed for constipation.     metFORMIN 500 MG tablet  Commonly known as:  GLUCOPHAGE  Take 500 mg by mouth 2 (two) times daily with a meal.     phenylephrine 0.25 % nasal spray  Commonly known as:  NEO-SYNEPHRINE  Place 1 spray into both nostrils every 6 (six) hours as needed for congestion.     pravastatin 40 MG tablet  Commonly known as:  PRAVACHOL  Take 1 tablet (40 mg total) by mouth at bedtime.         Signed: Karn Cassis 04/19/2014, 9:11 AM

## 2014-04-19 NOTE — Progress Notes (Signed)
Patient ID: [name], male   DOB: [birth year], 74 y.o.   MRN: [id number] Doing well. No sob. Eating. Some swelling but trachea in midline. Wants to go home

## 2014-04-19 NOTE — Progress Notes (Signed)
DC instructions given to patient and his wife. Patient has prescriptions provided by the MD. All questions answered. Patient in [facility] escorted by staff to lobby to be transported home by wife in private vehicle.

## 2014-04-19 NOTE — Progress Notes (Signed)
Physical Therapy Treatment Patient Details Name: [name] MRN: [id number] DOB: 01-05-1941 Today's Date: 04/19/2014    History of Present Illness The patient is a 74 year old white male with prior 2 level anterior cervical discectomy C4-6 fusion, plating a few years ago. He has developed recurrent neck and arm pain consistent with a cervical radiculopathy/myelopathy. He was worked up with a cervical myelo CT as he has a pacemaker. This demonstrates stenosis at C3-4 s/p ACDF C3-4 1/14    PT Comments    Patient very eager to walk and complete stairs so he can DC home. Patient will have assistance from his wife. Patient overrides instructions and cues stating that he know what hes been doing and he will be "alright". Patient safe to D/C from a mobility standpoint based on progression towards goals set on PT eval.    Follow Up Recommendations  Home health PT;Supervision for mobility/OOB     Equipment Recommendations  None recommended by PT    Recommendations for Other Services       Precautions / Restrictions Precautions Precautions: Cervical;Fall Restrictions Weight Bearing Restrictions: No    Mobility  Bed Mobility         Supine to sit: Supervision     General bed mobility comments: Cues for safety and correct transition  Transfers Overall transfer level: Needs assistance     Sit to Stand: Supervision         General transfer comment: cues for hand placement. Patient with tendency to hold RW  Ambulation/Gait   Ambulation Distance (Feet): 1200 Feet Assistive device: Rolling walker (2 wheeled) Gait Pattern/deviations: Step-through pattern;Trunk flexed;Decreased stride length;Shuffle     General Gait Details: mod cues for posture and position in RW   Stairs Stairs: Yes Stairs assistance: Min guard Stair Management: Alternating pattern;Forwards;One rail Left Number of Stairs: 5 General stair comments: Cues for safety and safe use of rail.    Wheelchair Mobility    Modified Rankin (Stroke Patients Only)       Balance                                    Cognition Arousal/Alertness: Awake/alert Behavior During Therapy: WFL for tasks assessed/performed Overall Cognitive Status: Impaired/Different from baseline Area of Impairment: Safety/judgement                    Exercises      General Comments        Pertinent Vitals/Pain Pain Assessment: No/denies pain    Home Living                      Prior Function            PT Goals (current goals can now be found in the care plan section) Progress towards PT goals: Progressing toward goals    Frequency  Min 5X/week    PT Plan Current plan remains appropriate    Co-evaluation             End of Session Equipment Utilized During Treatment: Gait belt Activity Tolerance: Patient tolerated treatment well Patient left: in chair;with call bell/phone within reach     Time: 0916-0930 PT Time Calculation (min) (ACUTE ONLY): 14 min  Charges:  $Gait Training: 8-22 mins                    G Codes:  Fredrich Birks 04/19/2014, 10:13 AM  04/19/2014 Fredrich Birks PTA [phone] pager [phone] office

## 2014-04-21 ENCOUNTER — Encounter (HOSPITAL_COMMUNITY): Payer: Self-pay | Admitting: Neurosurgery

## 2014-04-21 NOTE — Progress Notes (Signed)
Addendum to P.T. eval    04/18/14 1140  PT G-Codes **NOT FOR INPATIENT CLASS**  Functional Assessment Tool Used clinical judgement  Functional Limitation Mobility: Walking and moving around  Mobility: Walking and Moving Around Current Status (X5170) CJ  Mobility: Walking and Moving Around Goal Status (Y1749) CI  Delaney Meigs, PT [phone]

## 2014-05-01 ENCOUNTER — Inpatient Hospital Stay (HOSPITAL_COMMUNITY): Admission: RE | Admit: 2014-05-01 | Payer: Medicare Other | Source: Ambulatory Visit

## 2014-05-02 ENCOUNTER — Telehealth (HOSPITAL_COMMUNITY): Payer: Self-pay | Admitting: Vascular Surgery

## 2014-05-02 NOTE — Telephone Encounter (Signed)
Pt called refill hydralazine wife states drug store tried contacting the office several time and no response please advise

## 2014-05-07 ENCOUNTER — Other Ambulatory Visit (HOSPITAL_COMMUNITY): Payer: Self-pay | Admitting: Cardiology

## 2014-05-07 MED ORDER — HYDRALAZINE HCL 25 MG PO TABS: ORAL_TABLET | ORAL | Status: DC

## 2014-05-08 ENCOUNTER — Encounter (HOSPITAL_COMMUNITY): Payer: Medicare Other

## 2014-05-22 ENCOUNTER — Ambulatory Visit (HOSPITAL_COMMUNITY)
Admission: RE | Admit: 2014-05-22 | Discharge: 2014-05-22 | Disposition: A | Discharge status: Home / Self Care | Payer: Medicare Other | Source: Ambulatory Visit | Attending: Cardiology | Admitting: Cardiology

## 2014-05-22 VITALS — BP 110/56 | HR 79 | Wt 186.4 lb

## 2014-05-22 DIAGNOSIS — E119 Type 2 diabetes mellitus without complications: Secondary | ICD-10-CM | POA: Insufficient documentation

## 2014-05-22 DIAGNOSIS — Z95810 Presence of automatic (implantable) cardiac defibrillator: Secondary | ICD-10-CM | POA: Insufficient documentation

## 2014-05-22 DIAGNOSIS — I129 Hypertensive chronic kidney disease with stage 1 through stage 4 chronic kidney disease, or unspecified chronic kidney disease: Secondary | ICD-10-CM | POA: Insufficient documentation

## 2014-05-22 DIAGNOSIS — Z7982 Long term (current) use of aspirin: Secondary | ICD-10-CM | POA: Diagnosis not present

## 2014-05-22 DIAGNOSIS — I429 Cardiomyopathy, unspecified: Secondary | ICD-10-CM | POA: Insufficient documentation

## 2014-05-22 DIAGNOSIS — I447 Left bundle-branch block, unspecified: Secondary | ICD-10-CM | POA: Insufficient documentation

## 2014-05-22 DIAGNOSIS — I2510 Atherosclerotic heart disease of native coronary artery without angina pectoris: Secondary | ICD-10-CM

## 2014-05-22 DIAGNOSIS — K219 Gastro-esophageal reflux disease without esophagitis: Secondary | ICD-10-CM | POA: Insufficient documentation

## 2014-05-22 DIAGNOSIS — F329 Major depressive disorder, single episode, unspecified: Secondary | ICD-10-CM | POA: Insufficient documentation

## 2014-05-22 DIAGNOSIS — N184 Chronic kidney disease, stage 4 (severe): Secondary | ICD-10-CM | POA: Diagnosis not present

## 2014-05-22 DIAGNOSIS — I5022 Chronic systolic (congestive) heart failure: Secondary | ICD-10-CM | POA: Diagnosis not present

## 2014-05-22 DIAGNOSIS — I2583 Coronary atherosclerosis due to lipid rich plaque: Secondary | ICD-10-CM

## 2014-05-22 NOTE — Progress Notes (Signed)
Patient ID: [name], male   DOB: May 17, 1940, 74 y.o.   MRN: [id number]   Weight Range 196-199 pounds  Baseline proBNP    PCP: Dr Loleta Chance EP: Dr Ladona Ridgel  HPI: [name] is a 74 yo male with history of DM, HTN, hyperlipidemia, CKD stage III (Creatinine 1.7) and DVT (RUE). Diagnosed with systolic HF in 7/14 with EF 20%. found to have CAD with totally occluded RCA and high-grade lesion in proximal portion of small LCX. LV dysfunction felt to be out of proportion to CAD. Managed medically.   While in the hospital had intermittent atrial flutter while in the hospital thought to be from Milrinone. Diuresed with lasix and milrinone. Discharge weight 196 pounds.   He returns for follow up. Overall feels ok. Had neck surgery for a disc on January 14.  Denies SOB/PND/Orthopnea.Limited mobility. Uses a cane. Needs help with ADLs. Unable to weigh at home due to balance issue. Drinking less than 2 liters a day. Not following low salt diet. Wife says she stopped lasix 3 weeks ago due to incontinence and has not been swelling since.   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 The RCA is occluded with collaterals and the proximal LCx has a tight stenosis.  ECHO 03/19/13: EF 20%, mild AS  Labs: 11/20/12 Creatinine 1.5 Potassium 4.1 11/27/12 Creatinine 1.39 Potassium 3.9 01/09/13 Creatinine 2.05, KCL 5.5, pro-BNP 1187, cholesterol 121, TG 72, HDL 29, LDL 78 01/15/13 K+ 5.4, creatinine 1.7 01/24/13: K+ 4.7, creatinine 1.79, proBNP 08-04-01 05/17/13 K 5.0 Creatinine 1.45  07/03/13 K 5.1 Creatinine 1.57  9/15 K 4.7, creatinine 1.45, LDL 72 04/14/2014: K 4.7 Creatinine 1.2   SH: Married, lives at home with his wife.  Nonsmoker.   FH:  CAD  ROS: All systems negative except as listed in HPI, PMH and Problem  List.  Past Medical History  Diagnosis Date  . Hypertension   . Hyperlipidemia   . Cervical spinal stenosis   . Lumbar spinal stenosis   . 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 DVT (deep venous thrombosis) 10/2011    RUE; pt denies this hx on 05/17/13  . Anemia   . Cardiomyopathy     a. 10/2012 Echo: EF 20-25%, diff HK, mod dil LA/RV/RA.  Marland Kitchen CAD (coronary artery disease)   . Atrial flutter   . CKD (chronic kidney disease), stage III   . Venous insufficiency   . Frequent falls   . 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 Chronic systolic congestive heart failure   . Quadriparesis 2011-08-05    pre op  . Hypoglycemia   . Cervical myelopathy   . Acute encephalopathy   . Mononeuritis of unspecified site   . Dysrhythmia   . H/O hiatal hernia   . Arthritis     "all over" (2013/05/17)  . Depression     "son died in service in 05-Aug-1990; still bothers me" (05-17-13)  . AICD (automatic cardioverter/defibrillator) present   . Shortness of breath dyspnea     with exertion  . Pneumonia 1960    , also 2014  . Type II diabetes mellitus     type 2  . GERD (gastroesophageal reflux  disease)     Current Outpatient Prescriptions  Medication Sig Dispense Refill  . acarbose (PRECOSE) 50 MG tablet Take 50 mg by mouth 3 (three) times daily with meals. Take 1 tablet 1-3 times a day    . carvedilol (COREG) 12.5 MG tablet Take 1 tablet (12.5 mg total) by mouth 2 (two) times daily with a meal. 60 tablet 6  . docusate sodium (COLACE) 100 MG capsule Take 300 mg by mouth 2 (two) times daily.     . fluticasone (FLONASE) 50 MCG/ACT nasal spray Place 2 sprays into the nose daily as needed.     . gabapentin (NEURONTIN) 300 MG capsule Take 300 mg by mouth 3 (three) times daily.    Marland Kitchen glimepiride (AMARYL) 1 MG tablet Take 1 mg by mouth 2 (two) times daily before a meal.    . guaiFENesin (MUCINEX) 600 MG 12 hr tablet Take 600 mg by mouth 2 (two) times daily as needed.     .  hydrALAZINE (APRESOLINE) 25 MG tablet TAKE ONE-HALF TABLET BY MOUTH THREE TIMES DAILY 45 tablet 3  . HYDROcodone-acetaminophen (NORCO/VICODIN) 5-325 MG per tablet Take 1 tablet by mouth every 4 (four) hours as needed for severe pain.    . isosorbide mononitrate (IMDUR) 30 MG 24 hr tablet TAKE ONE TABLET BY MOUTH ONCE DAILY 30 tablet 6  . ivabradine HCl (CORLANOR) 5 MG TABS tablet Take 1 tablet (5 mg total) by mouth 2 (two) times daily with a meal. 180 tablet 3  . magnesium hydroxide (MILK OF MAGNESIA) 400 MG/5ML suspension Take 30 mL by mouth daily as needed for constipation.    . metFORMIN (GLUCOPHAGE) 500 MG tablet Take 500 mg by mouth 2 (two) times daily with a meal.     . phenylephrine (NEO-SYNEPHRINE) 0.25 % nasal spray Place 1 spray into both nostrils every 6 (six) hours as needed for congestion.    . pravastatin (PRAVACHOL) 40 MG tablet Take 1 tablet (40 mg total) by mouth at bedtime. 30 tablet 3   No current facility-administered medications for this encounter.   Filed Vitals:   05/22/14 1512  BP: 110/56  Pulse: 79  Weight: 186 lb 6.4 oz (84.55 kg)  SpO2: 100%    PHYSICAL EXAM: General: Elderly. No resp difficulty, uses walker and cane. Wife present  HEENT: normal Neck: supple. JVP 8  Carotids 2+ bilaterally; no bruits. No lymphadenopathy or thryomegaly appreciated. Cor: PMI normal. Distant heart sounds. Regular rate & rhythm. No rubs or gallops +SEM RSB L upper chest scar from ICD.    Lungs: clear Abdomen: soft, nontender, nondistended. No hepatosplenomegaly. No bruits or masses. Good bowel sounds. Extremities: no cyanosis, clubbing, rash.  No lower extremity edema   Neuro: alert & orientedx3, cranial nerves grossly intact. Moves all 4 extremities w/o difficulty. Affect pleasant.   Assessment/Plan: 1. Chronic systolic CHF: Mixed Ischemic/nonischemic cardiomyopathy with chronic LBBB s/p St Jude CRT-D, EF 20-25% (03/2013).  Volume stable.  NYHA class II symptoms.  - Volume  status stable off lasix. Counseled him and his wife to have low threshold to give prn lasix for weight gain.  - Continue Coreg and hydralazine/Imdur at current doses.  He has not tolerated uptitration due to orthostatic symptoms.   - He is not on an ACE-I or spironolactone with CKD.  - He is refusing to increase ivabradine to 7.5 mg bid. 2. CAD: LHC at last 12/2012 showed occluded RCA (chronic) with 90% stenosis in small LCx.  No chest pain.  It was  decided to avoid PCI to LCx as the vessel was small, he had no chest pain and PCI was unlikely to help LV function significantly, and PCI could compromise the major OM.  No evidence ischemia.  - Continue ASA, statin and BB.  Good lipids in 9/15.  4. Chronic kidney disease, Stage 3-4: Most recent creatinine 1.45.  5. 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 Per Dr Ladona Ridgel  6. AS, mild   Follow  Up  CLEGG,AMY NP-C 05/22/2014  Patient seen and examined with Tonye Becket, NP. We discussed all aspects of the encounter. I agree with the assessment and plan as stated above.   Overall stable. Volume status stable despite stopping lasix. Counseled him and his wife to have low threshold to give prn lasix for weight gain. He refuses to titrate ivabradine. He has not tolerated uptitration of other meds.   RTC 4 months with echo.  Reuel Boom Chalon Zobrist,MD 3:44 PM

## 2014-05-22 NOTE — Patient Instructions (Signed)
Doing great!  Follow up 4 months with an echocardiogram. We will call you closer to time to schedule.  Do the following things EVERYDAY: 1) Weigh yourself in the morning before breakfast. Write it down and keep it in a log. 2) Take your medicines as prescribed 3) Eat low salt foods-Limit salt (sodium) to 2000 mg per day.  4) Stay as active as you can everyday 5) Limit all fluids for the day to less than 2 liters

## 2014-05-22 NOTE — Addendum Note (Signed)
Encounter addended by: Ave Filter, RN on: 05/22/2014  3:59 PM<BR>     Documentation filed: Patient Instructions Section

## 2014-05-29 DIAGNOSIS — M4802 Spinal stenosis, cervical region: Secondary | ICD-10-CM | POA: Diagnosis not present

## 2014-06-19 DIAGNOSIS — Z961 Presence of intraocular lens: Secondary | ICD-10-CM | POA: Diagnosis not present

## 2014-06-19 DIAGNOSIS — E11351 Type 2 diabetes mellitus with proliferative diabetic retinopathy with macular edema: Secondary | ICD-10-CM | POA: Diagnosis not present

## 2014-06-24 DIAGNOSIS — M542 Cervicalgia: Secondary | ICD-10-CM | POA: Diagnosis not present

## 2014-07-20 ENCOUNTER — Other Ambulatory Visit (HOSPITAL_COMMUNITY): Payer: Self-pay | Admitting: Internal Medicine

## 2014-07-29 DIAGNOSIS — E1142 Type 2 diabetes mellitus with diabetic polyneuropathy: Secondary | ICD-10-CM | POA: Diagnosis not present

## 2014-07-29 DIAGNOSIS — I2510 Atherosclerotic heart disease of native coronary artery without angina pectoris: Secondary | ICD-10-CM | POA: Diagnosis not present

## 2014-08-26 ENCOUNTER — Encounter: Payer: Medicare Other | Admitting: Internal Medicine

## 2014-09-08 ENCOUNTER — Encounter: Payer: Self-pay | Admitting: *Deleted

## 2014-09-08 ENCOUNTER — Telehealth: Payer: Self-pay | Admitting: *Deleted

## 2014-09-08 ENCOUNTER — Telehealth: Payer: Self-pay | Admitting: Internal Medicine

## 2014-09-08 NOTE — Telephone Encounter (Signed)
New message    Patient wife calling back to speak with Gearldine Bienenstock

## 2014-09-08 NOTE — Telephone Encounter (Signed)
spoke to pt and got hx information.Marland Kitchen

## 2014-09-08 NOTE — Telephone Encounter (Signed)
left msg for Hossein Etris to call back for Fm status

## 2014-09-09 ENCOUNTER — Encounter: Payer: Self-pay | Admitting: Internal Medicine

## 2014-09-09 ENCOUNTER — Ambulatory Visit (INDEPENDENT_AMBULATORY_CARE_PROVIDER_SITE_OTHER): Payer: Medicare Other | Admitting: Internal Medicine

## 2014-09-09 VITALS — BP 120/66 | HR 75 | Ht 69.0 in | Wt 184.4 lb

## 2014-09-09 DIAGNOSIS — I10 Essential (primary) hypertension: Secondary | ICD-10-CM

## 2014-09-09 DIAGNOSIS — I255 Ischemic cardiomyopathy: Secondary | ICD-10-CM | POA: Diagnosis not present

## 2014-09-09 DIAGNOSIS — I5022 Chronic systolic (congestive) heart failure: Secondary | ICD-10-CM

## 2014-09-09 DIAGNOSIS — Z95810 Presence of automatic (implantable) cardiac defibrillator: Secondary | ICD-10-CM

## 2014-09-09 DIAGNOSIS — I5021 Acute systolic (congestive) heart failure: Secondary | ICD-10-CM | POA: Diagnosis not present

## 2014-09-09 LAB — CUP PACEART INCLINIC DEVICE CHECK
Battery Remaining Longevity: 55.2 mo
Brady Statistic RA Percent Paced: 14 %
Brady Statistic RV Percent Paced: 99 %
Date Time Interrogation Session: 20160607165337
HighPow Impedance: 70.875
Lead Channel Impedance Value: 462.5 Ohm
Lead Channel Pacing Threshold Amplitude: 1 V
Lead Channel Pacing Threshold Amplitude: 1.75 V
Lead Channel Pacing Threshold Pulse Width: 0.5 ms
Lead Channel Pacing Threshold Pulse Width: 0.5 ms
Lead Channel Pacing Threshold Pulse Width: 0.8 ms
Lead Channel Sensing Intrinsic Amplitude: 11.9 mV
Lead Channel Sensing Intrinsic Amplitude: 2.1 mV
Lead Channel Setting Pacing Amplitude: 2 V
Lead Channel Setting Pacing Amplitude: 2.875
Lead Channel Setting Pacing Pulse Width: 0.5 ms
Lead Channel Setting Sensing Sensitivity: 0.5 mV
MDC IDC MSMT LEADCHNL LV IMPEDANCE VALUE: 737.5 Ohm
MDC IDC MSMT LEADCHNL RA IMPEDANCE VALUE: 450 Ohm
MDC IDC MSMT LEADCHNL RV PACING THRESHOLD AMPLITUDE: 0.875 V
MDC IDC SET LEADCHNL LV PACING PULSEWIDTH: 0.8 ms
MDC IDC SET LEADCHNL RA PACING AMPLITUDE: 2 V
MDC IDC SET ZONE DETECTION INTERVAL: 280 ms
MDC IDC SET ZONE DETECTION INTERVAL: 340 ms
Pulse Gen Serial Number: 7170834
Zone Setting Detection Interval: 250 ms

## 2014-09-09 NOTE — Patient Instructions (Signed)
Medication Instructions:  Your physician recommends that you continue on your current medications as directed. Please refer to the Current Medication list given to you today.   Labwork: NONE  Testing/Procedures: NONe  Follow-Up: Your physician wants you to follow-up in: 12 months with Dr. Ladona Ridgel. You will receive a reminder letter in the mail two months in advance. If you don't receive a letter, please call our office to schedule the follow-up appointment.   Any Other Special Instructions Will Be Listed Below (If Applicable).

## 2014-09-09 NOTE — Progress Notes (Signed)
HPI Mr. Liddy returns today for ongoing ICD management. He is a pleasant retired Doctor, hospital with a h/o longstanding CHF, HTN, and severe arthritis. He has LBBB and class 3 CHF symptoms and an EF of 20% which has been longstanding despite maximal medical therapy. He has chronic peripheral edema. He has not had syncope. He does have CAD but it is thought that the severity of his CAD is not commensurate with the degree of his LV dysfunction. He underwent biventricular ICD implantation approximately 18 months ago. His dyspnea is improved.  He has minimal peripheral edema. No ICD shock. No syncope. Allergies  Allergen Reactions  . Dristan Cold [Chlorphen-Pe-Acetaminophen] Anxiety    Makes pt feel "wired up"  . Prednisone Other (See Comments)    Messes with blood sugar levels      Current Outpatient Prescriptions  Medication Sig Dispense Refill  . acarbose (PRECOSE) 50 MG tablet Take 50 mg by mouth 3 (three) times daily with meals. Take 1 tablet 1-3 times a day    . carvedilol (COREG) 12.5 MG tablet Take 1 tablet (12.5 mg total) by mouth 2 (two) times daily with a meal. 60 tablet 6  . docusate sodium (COLACE) 100 MG capsule Take 300 mg by mouth 2 (two) times daily.     . fluticasone (FLONASE) 50 MCG/ACT nasal spray Place 2 sprays into the nose daily as needed.     . furosemide (LASIX) 40 MG tablet Take 40 mg by mouth every other day.    . gabapentin (NEURONTIN) 300 MG capsule Take 300 mg by mouth 3 (three) times daily.    Marland Kitchen glimepiride (AMARYL) 1 MG tablet Take 1 mg by mouth 2 (two) times daily before a meal.    . guaiFENesin (MUCINEX) 600 MG 12 hr tablet Take 600 mg by mouth 2 (two) times daily as needed.     . hydrALAZINE (APRESOLINE) 25 MG tablet TAKE ONE-HALF TABLET BY MOUTH THREE TIMES DAILY 45 tablet 3  . HYDROcodone-acetaminophen (NORCO) 10-325 MG per tablet Take 1 tablet by mouth as needed. For pain    . isosorbide mononitrate (IMDUR) 30 MG 24 hr tablet TAKE ONE TABLET BY MOUTH  ONCE DAILY 30 tablet 6  . isosorbide mononitrate (IMDUR) 30 MG 24 hr tablet TAKE ONE TABLET BY MOUTH ONCE DAILY 30 tablet 3  . ivabradine HCl (CORLANOR) 5 MG TABS tablet Take 1 tablet (5 mg total) by mouth 2 (two) times daily with a meal. 180 tablet 3  . magnesium hydroxide (MILK OF MAGNESIA) 400 MG/5ML suspension Take 30 mL by mouth daily as needed for constipation.    . metFORMIN (GLUCOPHAGE) 500 MG tablet Take 500 mg by mouth 2 (two) times daily with a meal.     . phenylephrine (NEO-SYNEPHRINE) 0.25 % nasal spray Place 1 spray into both nostrils every 6 (six) hours as needed for congestion.    . pravastatin (PRAVACHOL) 40 MG tablet Take 1 tablet (40 mg total) by mouth at bedtime. 30 tablet 3  . silver sulfADIAZINE (SILVADENE) 1 % cream Apply 1 application topically. For infection of the skin     No current facility-administered medications for this visit.     Past Medical History  Diagnosis Date  . Hypertension   . Hyperlipidemia   . Cervical spinal stenosis   . Lumbar spinal stenosis   . 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 DVT (deep venous thrombosis) 10/2011    RUE; pt denies this hx  on 05/26/2013  . Anemia   . Cardiomyopathy     a. 10/2012 Echo: EF 20-25%, diff HK, mod dil LA/RV/RA.  Marland Kitchen CAD (coronary artery disease)   . Atrial flutter   . CKD (chronic kidney disease), stage III   . Venous insufficiency   . Frequent falls   . 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 Chronic systolic congestive heart failure   . Quadriparesis 08-04-11    pre op  . Hypoglycemia   . Cervical myelopathy   . Acute encephalopathy   . Mononeuritis of unspecified site   . Dysrhythmia   . H/O hiatal hernia   . Arthritis     "all over" (May 26, 2013)  . Depression     "son died in service in 08-04-90; still bothers me" (May 26, 2013)  . AICD (automatic cardioverter/defibrillator) present   . Shortness of breath dyspnea     with exertion  . Pneumonia 1960    , also  2014  . Type II diabetes mellitus     type 2  . GERD (gastroesophageal reflux disease)     ROS:   All systems reviewed and negative except as noted in the HPI.   Past Surgical History  Procedure Laterality Date  . Total hip arthroplasty Right ~ 08-04-10  . Toe amputation Left 12/2001; 06/2010    "2 toes" (05/26/2013)  . Anterior cervical decomp/discectomy fusion  10/31/2011    Procedure: ANTERIOR CERVICAL DECOMPRESSION/DISCECTOMY FUSION 2 LEVELS;  Surgeon: Cristi Loron, MD;  Location: [facility];  Service: Neurosurgery;  Laterality: N/A;  Cervical Four-Five Cervical Five-Six Anterior Cervical Decompression with fusion interbody prothesis plating and bonegraft  . Carpal tunnel release Right ~ 08/04/11  . Bi-ventricular implantable cardioverter defibrillator  (crt-d) Left 26-May-2013    STJ CRTD implanted by Dr Ladona Ridgel for primary prevention/CHF  . Cataract extraction w/ intraocular lens implant Bilateral   . Cardiac catheterization  10/2012  . Left and right heart catheterization with coronary angiogram N/A 11/06/2012    Procedure: LEFT AND RIGHT HEART CATHETERIZATION WITH CORONARY ANGIOGRAM;  Surgeon: Laurey Morale, MD;  Location: [facility] CATH LAB;  Service: Cardiovascular;  Laterality: N/A;  . Bi-ventricular implantable cardioverter defibrillator N/A 26-May-2013    Procedure: BI-VENTRICULAR IMPLANTABLE CARDIOVERTER DEFIBRILLATOR  (CRT-D);  Surgeon: Marinus Maw, MD;  Location: [facility] CATH LAB;  Service: Cardiovascular;  Laterality: N/A;  . Joint replacement  04-Aug-2011  . Anterior cervical decomp/discectomy fusion N/A 04/17/2014    Procedure: CERVICAL THREE TO FOUR ANTERIOR CERVICAL DECOMPRESSION/DISCECTOMY FUSION 1 LEVEL/HARDWARE REMOVAL;  Surgeon: Tressie Stalker, MD;  Location: [facility];  Service: Neurosurgery;  Laterality: N/A;  C34 anterior cervical decompression with fusion interbody prosthesis plating and bonegraft with exploration of prev fusion and possible removal of old hardware     Family  History  Problem Relation Age of Onset  . Diabetes Brother   . Cancer Father     Lung cancer  . Heart attack Mother     pt thinks she had MI  . Cancer Mother      History   Social History  . Marital Status: Married    Spouse Name: N/A  . Number of Children: N/A  . Years of Education: N/A   Occupational History  . Not on file.   Social History Main Topics  . Smoking status: Never Smoker   . Smokeless tobacco: Never Used  . Alcohol Use: No     Comment: 05/26/2013 "used to drink a little bit  a long time ago; nothing in over 30 yrs"  . Drug Use: No  . Sexual Activity: Not Currently   Other Topics Concern  . Not on file   Social History Narrative     BP 120/66 mmHg  Pulse 75  Ht  (1.753 m)  Wt 184 lb 6.4 oz (83.643 kg)  BMI 27.22 kg/m2  Physical Exam:  Chronically ill appearing 74 yo man, NAD HEENT: Unremarkable Neck:  7 cmJVD, no thyromegally Back:  No CVA tenderness Lungs:  Clear with no wheezes HEART:  Regular rate rhythm, no murmurs, no rubs, no clicks Abd:  soft, positive bowel sounds, no organomegally, no rebound, no guarding Ext:  2 plus pulses, no edema, no cyanosis, no clubbing Skin:  No rashes no nodules Neuro:  CN II through XII intact, motor grossly intact  ICD interrogation - normal device function.  Assess/Plan:

## 2014-09-09 NOTE — Assessment & Plan Note (Signed)
His blood pressure is well controlled. Will follow. 

## 2014-09-09 NOTE — Assessment & Plan Note (Signed)
His symptoms are class 2. He will continue his current meds and maintain a low sodium diet. He is limited most by arthritis.

## 2014-09-09 NOTE — Assessment & Plan Note (Signed)
His [employer] Jude BiV ICD is working normally. Will recheck in several months.  

## 2014-09-23 DIAGNOSIS — M545 Low back pain: Secondary | ICD-10-CM | POA: Diagnosis not present

## 2014-09-23 DIAGNOSIS — M5416 Radiculopathy, lumbar region: Secondary | ICD-10-CM | POA: Diagnosis not present

## 2014-09-23 DIAGNOSIS — M4806 Spinal stenosis, lumbar region: Secondary | ICD-10-CM | POA: Diagnosis not present

## 2014-09-23 DIAGNOSIS — M542 Cervicalgia: Secondary | ICD-10-CM | POA: Diagnosis not present

## 2014-09-30 ENCOUNTER — Ambulatory Visit (HOSPITAL_COMMUNITY): Payer: Medicare Other

## 2014-09-30 ENCOUNTER — Encounter (HOSPITAL_COMMUNITY): Payer: Medicare Other

## 2014-10-03 ENCOUNTER — Other Ambulatory Visit: Payer: Self-pay | Admitting: Physical Medicine and Rehabilitation

## 2014-10-03 DIAGNOSIS — M48061 Spinal stenosis, lumbar region without neurogenic claudication: Secondary | ICD-10-CM

## 2014-10-14 ENCOUNTER — Ambulatory Visit
Admission: RE | Admit: 2014-10-14 | Discharge: 2014-10-14 | Disposition: A | Discharge status: Home / Self Care | Payer: Medicare Other | Source: Ambulatory Visit | Attending: Physical Medicine and Rehabilitation | Admitting: Physical Medicine and Rehabilitation

## 2014-10-14 DIAGNOSIS — M4316 Spondylolisthesis, lumbar region: Secondary | ICD-10-CM | POA: Diagnosis not present

## 2014-10-14 DIAGNOSIS — M47817 Spondylosis without myelopathy or radiculopathy, lumbosacral region: Secondary | ICD-10-CM | POA: Diagnosis not present

## 2014-10-14 DIAGNOSIS — M9974 Connective tissue and disc stenosis of intervertebral foramina of sacral region: Secondary | ICD-10-CM | POA: Diagnosis not present

## 2014-10-14 DIAGNOSIS — M48061 Spinal stenosis, lumbar region without neurogenic claudication: Secondary | ICD-10-CM

## 2014-10-14 DIAGNOSIS — M9973 Connective tissue and disc stenosis of intervertebral foramina of lumbar region: Secondary | ICD-10-CM | POA: Diagnosis not present

## 2014-10-14 MED ORDER — IOHEXOL 180 MG/ML  SOLN
15.0000 mL | Freq: Once | INTRAMUSCULAR | Status: AC | PRN
  Administered 2014-10-14: 15 mL via INTRATHECAL

## 2014-10-14 NOTE — Discharge Instructions (Signed)

## 2014-10-28 ENCOUNTER — Other Ambulatory Visit (HOSPITAL_COMMUNITY): Payer: Self-pay | Admitting: Internal Medicine

## 2014-10-28 DIAGNOSIS — I5022 Chronic systolic (congestive) heart failure: Secondary | ICD-10-CM

## 2014-10-29 NOTE — Telephone Encounter (Signed)
bensimhon refill. Thank you for your time. 

## 2014-11-06 DIAGNOSIS — M5416 Radiculopathy, lumbar region: Secondary | ICD-10-CM | POA: Diagnosis not present

## 2014-11-06 DIAGNOSIS — M545 Low back pain: Secondary | ICD-10-CM | POA: Diagnosis not present

## 2014-11-06 DIAGNOSIS — M4806 Spinal stenosis, lumbar region: Secondary | ICD-10-CM | POA: Diagnosis not present

## 2014-11-06 DIAGNOSIS — I10 Essential (primary) hypertension: Secondary | ICD-10-CM | POA: Diagnosis not present

## 2014-11-18 DIAGNOSIS — E11329 Type 2 diabetes mellitus with mild nonproliferative diabetic retinopathy without macular edema: Secondary | ICD-10-CM | POA: Diagnosis not present

## 2014-12-10 ENCOUNTER — Other Ambulatory Visit (HOSPITAL_COMMUNITY): Payer: Self-pay | Admitting: Internal Medicine

## 2014-12-16 ENCOUNTER — Ambulatory Visit (HOSPITAL_BASED_OUTPATIENT_CLINIC_OR_DEPARTMENT_OTHER)
Admission: RE | Admit: 2014-12-16 | Discharge: 2014-12-16 | Disposition: A | Discharge status: Home / Self Care | Payer: Medicare Other | Source: Ambulatory Visit | Attending: Internal Medicine | Admitting: Internal Medicine

## 2014-12-16 ENCOUNTER — Ambulatory Visit (INDEPENDENT_AMBULATORY_CARE_PROVIDER_SITE_OTHER): Payer: Medicare Other | Admitting: *Deleted

## 2014-12-16 ENCOUNTER — Ambulatory Visit (HOSPITAL_COMMUNITY)
Admission: RE | Admit: 2014-12-16 | Discharge: 2014-12-16 | Disposition: A | Discharge status: Home / Self Care | Payer: Medicare Other | Source: Ambulatory Visit | Attending: Internal Medicine | Admitting: Internal Medicine

## 2014-12-16 VITALS — BP 88/50 | HR 67 | Wt 184.0 lb

## 2014-12-16 DIAGNOSIS — Z01818 Encounter for other preprocedural examination: Secondary | ICD-10-CM | POA: Insufficient documentation

## 2014-12-16 DIAGNOSIS — I5022 Chronic systolic (congestive) heart failure: Secondary | ICD-10-CM | POA: Insufficient documentation

## 2014-12-16 DIAGNOSIS — I2510 Atherosclerotic heart disease of native coronary artery without angina pectoris: Secondary | ICD-10-CM | POA: Diagnosis not present

## 2014-12-16 DIAGNOSIS — N184 Chronic kidney disease, stage 4 (severe): Secondary | ICD-10-CM | POA: Diagnosis not present

## 2014-12-16 DIAGNOSIS — I255 Ischemic cardiomyopathy: Secondary | ICD-10-CM | POA: Diagnosis not present

## 2014-12-16 DIAGNOSIS — I5021 Acute systolic (congestive) heart failure: Secondary | ICD-10-CM | POA: Diagnosis not present

## 2014-12-16 LAB — COMPREHENSIVE METABOLIC PANEL
ALT: 13 U/L — AB (ref 17–63)
AST: 24 U/L (ref 15–41)
Albumin: 3.9 g/dL (ref 3.5–5.0)
Alkaline Phosphatase: 39 U/L (ref 38–126)
Anion gap: 11 (ref 5–15)
BUN: 28 mg/dL — ABNORMAL HIGH (ref 6–20)
CO2: 28 mmol/L (ref 22–32)
CREATININE: 1.85 mg/dL — AB (ref 0.61–1.24)
Calcium: 9.9 mg/dL (ref 8.9–10.3)
Chloride: 98 mmol/L — ABNORMAL LOW (ref 101–111)
GFR calc non Af Amer: 34 mL/min — ABNORMAL LOW (ref >60)
GFR, EST AFRICAN AMERICAN: 40 mL/min — AB (ref >60)
Glucose, Bld: 99 mg/dL (ref 65–99)
Potassium: 4.4 mmol/L (ref 3.5–5.1)
Sodium: 137 mmol/L (ref 135–145)
Total Bilirubin: 0.4 mg/dL (ref 0.3–1.2)
Total Protein: 7.2 g/dL (ref 6.5–8.1)

## 2014-12-16 LAB — LIPID PANEL
CHOL/HDL RATIO: 4.4 ratio
CHOLESTEROL: 123 mg/dL (ref 0–200)
HDL: 28 mg/dL — ABNORMAL LOW (ref >40)
LDL Cholesterol: 70 mg/dL (ref 0–99)
Triglycerides: 126 mg/dL (ref <150)
VLDL: 25 mg/dL (ref 0–40)

## 2014-12-16 LAB — BRAIN NATRIURETIC PEPTIDE: B Natriuretic Peptide: 152.5 pg/mL — ABNORMAL HIGH (ref 0.0–100.0)

## 2014-12-16 LAB — TSH: TSH: 2.928 u[IU]/mL (ref 0.350–4.500)

## 2014-12-16 MED ORDER — ASPIRIN EC 81 MG PO TBEC: 81.0000 mg | DELAYED_RELEASE_TABLET | Freq: Every day | ORAL | Status: DC

## 2014-12-16 NOTE — Progress Notes (Signed)
  Echocardiogram 2D Echocardiogram has been performed.  [name] 12/16/2014, 3:18 PM

## 2014-12-16 NOTE — Patient Instructions (Signed)
Labs today  Your physician recommends that you schedule a follow-up appointment in: 4 months  Do the following things EVERYDAY: 1) Weigh yourself in the morning before breakfast. Write it down and keep it in a log. 2) Take your medicines as prescribed 3) Eat low salt foods-Limit salt (sodium) to 2000 mg per day.  4) Stay as active as you can everyday 5) Limit all fluids for the day to less than 2 liters 6)   

## 2014-12-17 NOTE — Progress Notes (Signed)
Patient ID: [name], male   DOB: 13-Sep-1940, 74 y.o.   MRN: [id number]   Weight Range 196-199 pounds  Baseline proBNP   PCP: Dr Loleta Chance EP: Dr Ladona Ridgel  HPI: [name] is a 74 yo male with history of DM, HTN, hyperlipidemia, CKD stage III (Creatinine 1.7) and DVT (RUE). Diagnosed with systolic HF in 7/14 with EF 20%. found to have CAD with totally occluded RCA and high-grade lesion in proximal portion of small LCX. LV dysfunction felt to be out of proportion to CAD. Managed medically.   While in the hospital had intermittent atrial flutter while in the hospital thought to be from Milrinone. Diuresed with lasix and milrinone. Discharge weight 196 pounds.   He returns for followup. Stable clinically.  Main problem has been back pain with spinal stenosis that has limited ambulation.  He may need a back operation.  No chest pain.  No exertional dyspnea though not walking far.  No orthopnea or PND.  No palpitations.  Weight is down 2 lbs since last appointment. BP is low today but no lightheadedness.  Says that it usually runs higher.  Echo done today was reviewed (see below).   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 The RCA is occluded with collaterals and the proximal LCx has a tight stenosis.  - ECHO 03/19/13: EF 20%, mild AS - Echo (9/16) with EF 45-50%, basal inferior/basal-mid inferolateral/basal anterolateral AK, normal RV size and systolic function, aortic sclerosis without stenosis, mild MR, PASP 50 mmHg.   Labs: 11/20/12 Creatinine 1.5 Potassium 4.1 11/27/12 Creatinine 1.39 Potassium 3.9 01/09/13 Creatinine 2.05, KCL 5.5, pro-BNP 1187, cholesterol 121, TG 72, HDL 29, LDL 78 01/15/13 K+ 5.4, creatinine 1.7 01/24/13: K+ 4.7, creatinine 1.79, proBNP 08-19-01 06-11-2013 K 5.0 Creatinine  1.45  07/03/13 K 5.1 Creatinine 1.57  9/15 K 4.7, creatinine 1.45, LDL 72 04/14/2014: K 4.7 Creatinine 1.2  1/16: K 4.7, creatinine 1.2  SH: Married, lives at home with his wife.  Nonsmoker.   FH:  CAD  ROS: All systems negative except as listed in HPI, PMH and Problem List.  Past Medical History  Diagnosis Date  . Hypertension   . Hyperlipidemia   . Cervical spinal stenosis   . Lumbar spinal stenosis   . 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 DVT (deep venous thrombosis) 10/2011    RUE; pt denies this hx on 11-Jun-2013  . Anemia   . Cardiomyopathy     a. 10/2012 Echo: EF 20-25%, diff HK, mod dil LA/RV/RA.  Marland Kitchen CAD (coronary artery disease)   . Atrial flutter   . CKD (chronic kidney disease), stage III   . Venous insufficiency   . Frequent falls   . 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 Chronic systolic congestive heart failure   . Quadriparesis 08-20-11    pre op  . Hypoglycemia   . Cervical myelopathy   . Acute encephalopathy   . Mononeuritis of unspecified site   . Dysrhythmia   . H/O hiatal hernia   . Arthritis     "all over" (2013-06-11)  . Depression     "son died in service in Aug 20, 1990; still bothers me" (06-11-2013)  .  AICD (automatic cardioverter/defibrillator) present   . Shortness of breath dyspnea     with exertion  . Pneumonia 1960    , also 2014  . Type II diabetes mellitus     type 2  . GERD (gastroesophageal reflux disease)     Current Outpatient Prescriptions  Medication Sig Dispense Refill  . acarbose (PRECOSE) 50 MG tablet Take 50 mg by mouth 3 (three) times daily with meals. Take 1 tablet 1-3 times a day    . carvedilol (COREG) 12.5 MG tablet Take 1 tablet (12.5 mg total) by mouth 2 (two) times daily with a meal. 60 tablet 6  . docusate sodium (COLACE) 100 MG capsule Take 300 mg by mouth 2 (two) times daily.     . fluticasone (FLONASE) 50 MCG/ACT nasal spray Place 2 sprays into the nose daily as needed.     .  furosemide (LASIX) 40 MG tablet Take 40 mg by mouth daily.    Marland Kitchen gabapentin (NEURONTIN) 300 MG capsule Take 300 mg by mouth 3 (three) times daily.    Marland Kitchen glimepiride (AMARYL) 1 MG tablet Take 1 mg by mouth 2 (two) times daily before a meal.    . guaiFENesin (MUCINEX) 600 MG 12 hr tablet Take 600 mg by mouth 2 (two) times daily as needed.     . hydrALAZINE (APRESOLINE) 25 MG tablet TAKE ONE-HALF TABLET BY MOUTH THREE TIMES DAILY 405 tablet 2  . HYDROcodone-acetaminophen (NORCO) 10-325 MG per tablet Take 1 tablet by mouth as needed. For pain    . isosorbide mononitrate (IMDUR) 30 MG 24 hr tablet TAKE ONE TABLET BY MOUTH ONCE DAILY 30 tablet 6  . ivabradine HCl (CORLANOR) 5 MG TABS tablet Take 1 tablet (5 mg total) by mouth 2 (two) times daily with a meal. 180 tablet 3  . magnesium hydroxide (MILK OF MAGNESIA) 400 MG/5ML suspension Take 30 mL by mouth daily as needed for constipation.    . metFORMIN (GLUCOPHAGE) 500 MG tablet Take 500 mg by mouth 2 (two) times daily with a meal.     . phenylephrine (NEO-SYNEPHRINE) 0.25 % nasal spray Place 1 spray into both nostrils every 6 (six) hours as needed for congestion.    . pravastatin (PRAVACHOL) 40 MG tablet Take 1 tablet (40 mg total) by mouth at bedtime. 30 tablet 3  . silver sulfADIAZINE (SILVADENE) 1 % cream Apply 1 application topically. For infection of the skin    . aspirin EC 81 MG tablet Take 1 tablet (81 mg total) by mouth daily. 30 tablet 6   No current facility-administered medications for this encounter.   Filed Vitals:   12/16/14 1523  BP: 88/50  Pulse: 67  Weight: 184 lb (83.462 kg)  SpO2: 97%    PHYSICAL EXAM: General: Elderly. No resp difficulty, uses walker and cane. Wife present  HEENT: normal Neck: supple. JVP 7  Carotids 2+ bilaterally; no bruits. No lymphadenopathy or thryomegaly appreciated. Cor: PMI normal. Distant heart sounds. Regular rate & rhythm. No rubs or gallops +SEM RSB L upper chest scar from ICD.    Lungs:  clear Abdomen: soft, nontender, nondistended. No hepatosplenomegaly. No bruits or masses. Good bowel sounds. Extremities: no cyanosis, clubbing, rash.  No lower extremity edema   Neuro: alert & orientedx3, cranial nerves grossly intact. Moves all 4 extremities w/o difficulty. Affect pleasant.   Assessment/Plan: 1. Chronic systolic CHF: Mixed Ischemic/nonischemic cardiomyopathy with chronic LBBB s/p St Jude CRT-D, EF 20-25% (03/2013) but improved to 45-50% on today's echo.  Volume stable.  NYHA class II symptoms. Mainly limited by back pain from spinal stenosis. - Continue Lasix 40 mg daily, looks euvolemic.  BMET/BNP today.   - Continue Coreg and hydralazine/Imdur at current doses.  He has not tolerated uptitration due to orthostatic symptoms and BP is soft today.   - He is not on an ACE-I or spironolactone with CKD.  Most recent creatinine improved at 1.2, will repeat BMET today.  - Continue ivabradine 5 mg bid.  2. CAD: LHC at last 12/2012 showed occluded RCA (chronic) with 90% stenosis in small LCx.  No chest pain.  It was decided to avoid PCI to LCx as the vessel was small, he had no chest pain, PCI was unlikely to help LV function significantly, and PCI could compromise the major OM.   - Continue ASA, statin and BB.  - Check lipids today, goal LDL < 70.  3. Chronic kidney disease, Stage 3-4: Most recent creatinine improved at 1.2.  Repeat BMET today.  4. Preoperative evaluation: He may need back surgery due to intractable pain and spinal stenosis.  He would be higher risk, but given improvement in EF back to 45-50%, risk would not be prohibitive.   Marca Ancona 12/17/2014 11:03 AM

## 2014-12-18 ENCOUNTER — Other Ambulatory Visit (HOSPITAL_COMMUNITY): Payer: Self-pay | Admitting: *Deleted

## 2014-12-18 NOTE — Progress Notes (Signed)
Remote ICD transmission.   

## 2015-01-01 LAB — CUP PACEART REMOTE DEVICE CHECK
Battery Remaining Percentage: 72 %
Brady Statistic RV Percent Paced: 99 %
HighPow Impedance: 71 Ohm
Lead Channel Impedance Value: 690 Ohm
Lead Channel Pacing Threshold Amplitude: 1.125 V
Lead Channel Pacing Threshold Amplitude: 2 V
Lead Channel Pacing Threshold Pulse Width: 0.5 ms
Lead Channel Sensing Intrinsic Amplitude: 11.9 mV
Lead Channel Sensing Intrinsic Amplitude: 2.5 mV
Lead Channel Setting Pacing Amplitude: 3 V
Lead Channel Setting Sensing Sensitivity: 0.5 mV
MDC IDC MSMT LEADCHNL LV PACING THRESHOLD PULSEWIDTH: 0.8 ms
MDC IDC MSMT LEADCHNL RA IMPEDANCE VALUE: 440 Ohm
MDC IDC MSMT LEADCHNL RV IMPEDANCE VALUE: 400 Ohm
MDC IDC SESS DTM: 20160929165040
MDC IDC SET LEADCHNL LV PACING PULSEWIDTH: 0.8 ms
MDC IDC SET LEADCHNL RA PACING AMPLITUDE: 2 V
MDC IDC SET LEADCHNL RV PACING AMPLITUDE: 2.125
MDC IDC SET LEADCHNL RV PACING PULSEWIDTH: 0.5 ms
MDC IDC STAT BRADY RA PERCENT PACED: 13 %
Pulse Gen Serial Number: 7170834
Zone Setting Detection Interval: 250 ms
Zone Setting Detection Interval: 280 ms
Zone Setting Detection Interval: 340 ms

## 2015-01-06 ENCOUNTER — Encounter: Payer: Self-pay | Admitting: Internal Medicine

## 2015-01-06 DIAGNOSIS — Z961 Presence of intraocular lens: Secondary | ICD-10-CM | POA: Diagnosis not present

## 2015-01-06 DIAGNOSIS — E113513 Type 2 diabetes mellitus with proliferative diabetic retinopathy with macular edema, bilateral: Secondary | ICD-10-CM | POA: Diagnosis not present

## 2015-01-06 DIAGNOSIS — E11311 Type 2 diabetes mellitus with unspecified diabetic retinopathy with macular edema: Secondary | ICD-10-CM | POA: Diagnosis not present

## 2015-01-09 ENCOUNTER — Encounter: Payer: Self-pay | Admitting: Cardiology

## 2015-01-09 ENCOUNTER — Other Ambulatory Visit (HOSPITAL_COMMUNITY): Payer: Self-pay | Admitting: Internal Medicine

## 2015-01-12 DIAGNOSIS — M545 Low back pain: Secondary | ICD-10-CM | POA: Diagnosis not present

## 2015-01-12 DIAGNOSIS — M4806 Spinal stenosis, lumbar region: Secondary | ICD-10-CM | POA: Diagnosis not present

## 2015-01-12 DIAGNOSIS — M5416 Radiculopathy, lumbar region: Secondary | ICD-10-CM | POA: Diagnosis not present

## 2015-01-17 ENCOUNTER — Other Ambulatory Visit (HOSPITAL_COMMUNITY): Payer: Self-pay | Admitting: Adult Health

## 2015-01-19 ENCOUNTER — Telehealth (HOSPITAL_COMMUNITY): Payer: Self-pay | Admitting: *Deleted

## 2015-01-19 NOTE — Telephone Encounter (Signed)
Received note from [facility], pt is scheduled for L34 L45 laminectomy and foraminotomy with Dr Lovell Sheehan and they need cardiac clearance.  Per Dr Shirlee Latch p to is ok to proceed with surgery, form faxed back to [phone]

## 2015-01-20 ENCOUNTER — Other Ambulatory Visit: Payer: Self-pay | Admitting: Neurosurgery

## 2015-01-20 DIAGNOSIS — Z23 Encounter for immunization: Secondary | ICD-10-CM | POA: Diagnosis not present

## 2015-01-20 DIAGNOSIS — N183 Chronic kidney disease, stage 3 (moderate): Secondary | ICD-10-CM | POA: Diagnosis not present

## 2015-01-20 DIAGNOSIS — E1142 Type 2 diabetes mellitus with diabetic polyneuropathy: Secondary | ICD-10-CM | POA: Diagnosis not present

## 2015-01-20 DIAGNOSIS — E1122 Type 2 diabetes mellitus with diabetic chronic kidney disease: Secondary | ICD-10-CM | POA: Diagnosis not present

## 2015-01-21 ENCOUNTER — Encounter: Payer: Self-pay | Admitting: Internal Medicine

## 2015-01-29 ENCOUNTER — Encounter (HOSPITAL_COMMUNITY)
Admission: RE | Admit: 2015-01-29 | Discharge: 2015-01-29 | Disposition: A | Discharge status: Home / Self Care | Payer: Medicare Other | Source: Ambulatory Visit | Attending: Neurosurgery | Admitting: Neurosurgery

## 2015-01-29 ENCOUNTER — Encounter (HOSPITAL_COMMUNITY): Payer: Self-pay

## 2015-01-29 ENCOUNTER — Other Ambulatory Visit (HOSPITAL_COMMUNITY): Payer: Self-pay | Admitting: Adult Health

## 2015-01-29 DIAGNOSIS — I252 Old myocardial infarction: Secondary | ICD-10-CM | POA: Diagnosis not present

## 2015-01-29 DIAGNOSIS — Z01812 Encounter for preprocedural laboratory examination: Secondary | ICD-10-CM | POA: Diagnosis not present

## 2015-01-29 DIAGNOSIS — I130 Hypertensive heart and chronic kidney disease with heart failure and stage 1 through stage 4 chronic kidney disease, or unspecified chronic kidney disease: Secondary | ICD-10-CM | POA: Insufficient documentation

## 2015-01-29 DIAGNOSIS — M4806 Spinal stenosis, lumbar region: Secondary | ICD-10-CM | POA: Insufficient documentation

## 2015-01-29 DIAGNOSIS — Z79899 Other long term (current) drug therapy: Secondary | ICD-10-CM | POA: Diagnosis not present

## 2015-01-29 DIAGNOSIS — Z7982 Long term (current) use of aspirin: Secondary | ICD-10-CM | POA: Diagnosis not present

## 2015-01-29 DIAGNOSIS — I2510 Atherosclerotic heart disease of native coronary artery without angina pectoris: Secondary | ICD-10-CM | POA: Diagnosis not present

## 2015-01-29 DIAGNOSIS — Z86718 Personal history of other venous thrombosis and embolism: Secondary | ICD-10-CM | POA: Diagnosis not present

## 2015-01-29 DIAGNOSIS — Z01818 Encounter for other preprocedural examination: Secondary | ICD-10-CM | POA: Insufficient documentation

## 2015-01-29 DIAGNOSIS — I5020 Unspecified systolic (congestive) heart failure: Secondary | ICD-10-CM | POA: Diagnosis not present

## 2015-01-29 DIAGNOSIS — N183 Chronic kidney disease, stage 3 (moderate): Secondary | ICD-10-CM | POA: Diagnosis not present

## 2015-01-29 DIAGNOSIS — Z7984 Long term (current) use of oral hypoglycemic drugs: Secondary | ICD-10-CM | POA: Insufficient documentation

## 2015-01-29 DIAGNOSIS — E785 Hyperlipidemia, unspecified: Secondary | ICD-10-CM | POA: Insufficient documentation

## 2015-01-29 DIAGNOSIS — E1122 Type 2 diabetes mellitus with diabetic chronic kidney disease: Secondary | ICD-10-CM | POA: Diagnosis not present

## 2015-01-29 DIAGNOSIS — Z95810 Presence of automatic (implantable) cardiac defibrillator: Secondary | ICD-10-CM | POA: Insufficient documentation

## 2015-01-29 DIAGNOSIS — I872 Venous insufficiency (chronic) (peripheral): Secondary | ICD-10-CM | POA: Diagnosis not present

## 2015-01-29 DIAGNOSIS — K219 Gastro-esophageal reflux disease without esophagitis: Secondary | ICD-10-CM | POA: Diagnosis not present

## 2015-01-29 HISTORY — DX: Acute myocardial infarction, unspecified: I21.9

## 2015-01-29 LAB — BASIC METABOLIC PANEL
ANION GAP: 14 (ref 5–15)
BUN: 27 mg/dL — ABNORMAL HIGH (ref 6–20)
CALCIUM: 10.4 mg/dL — AB (ref 8.9–10.3)
CO2: 25 mmol/L (ref 22–32)
CREATININE: 1.84 mg/dL — AB (ref 0.61–1.24)
Chloride: 103 mmol/L (ref 101–111)
GFR calc Af Amer: 40 mL/min — ABNORMAL LOW (ref >60)
GFR, EST NON AFRICAN AMERICAN: 34 mL/min — AB (ref >60)
GLUCOSE: 186 mg/dL — AB (ref 65–99)
Potassium: 4.4 mmol/L (ref 3.5–5.1)
Sodium: 142 mmol/L (ref 135–145)

## 2015-01-29 LAB — CBC
HCT: 37.2 % — ABNORMAL LOW (ref 39.0–52.0)
HEMOGLOBIN: 12.2 g/dL — AB (ref 13.0–17.0)
MCH: 31.3 pg (ref 26.0–34.0)
MCHC: 32.8 g/dL (ref 30.0–36.0)
MCV: 95.4 fL (ref 78.0–100.0)
PLATELETS: 201 10*3/uL (ref 150–400)
RBC: 3.9 MIL/uL — ABNORMAL LOW (ref 4.22–5.81)
RDW: 14.8 % (ref 11.5–15.5)
WBC: 8.2 10*3/uL (ref 4.0–10.5)

## 2015-01-29 LAB — GLUCOSE, CAPILLARY: Glucose-Capillary: 143 mg/dL — ABNORMAL HIGH (ref 65–99)

## 2015-01-29 LAB — SURGICAL PCR SCREEN
MRSA, PCR: NEGATIVE
STAPHYLOCOCCUS AUREUS: POSITIVE — AB

## 2015-01-29 NOTE — Progress Notes (Signed)
Kerry Fort from [facility] called and informed of surgery, date and time.

## 2015-01-29 NOTE — Pre-Procedure Instructions (Addendum)
[name]  01/29/2015      [facility] - [place], [place] - [address] [place] [address] Phone: [phone] Fax: [phone]  [facility] [address] [place], [place] - [address] [place] [address] Phone: [phone] Fax: [phone]    Your procedure is scheduled on Oct 31  Report to [name] Admitting at 915 A.M.  Call this number if you have problems the morning of surgery:   [phone]   Remember:  Do not eat food or drink liquids after midnight.  Take these medicines the morning of surgery with A SIP OF WATE Carvedilol (Coreg), Flonase nasal spray if needed, gabapentin (Neurontin),  Guaifenesin (Mucinex), Hydrocodone- acetaminophen (Norco), Isosorbide mononitrate (Imdur), Ivabradine HCL (Corlanor) How to Manage Your Diabetes Before Surgery   Why is it important to control my blood sugar before and after surgery?   Improving blood sugar levels before and after surgery helps healing and can limit problems.  A way of improving blood sugar control is eating a healthy diet by:  - Eating less sugar and carbohydrates  - Increasing activity/exercise  - Talk with your doctor about reaching your blood sugar goals  High blood sugars (greater than 180 mg/dL) can raise your risk of infections and slow down your recovery so you will need to focus on controlling your diabetes during the weeks before surgery.  Make sure that the doctor who takes care of your diabetes knows about your planned surgery including the date and location.  How do I manage my blood sugars before surgery?   Check your blood sugar at least 4 times a day, 2 days before surgery to make sure that they are not too high or low.   Check your blood sugar the morning of your surgery when you wake up and every 2               hours until you get to the Short-Stay unit.  If your blood sugar is  less than 70 mg/dL, you will need to treat for low blood sugar by:  Treat a low blood sugar (less than 70 mg/dL) with 1/2 cup of clear juice (cranberry or apple), 4 glucose tablets, OR glucose gel.  Recheck blood sugar in 15 minutes after treatment (to make sure it is greater than 70 mg/dL).  If blood sugar is not greater than 70 mg/dL on re-check, call [phone] for further instructions.   Report your blood sugar to the Short-Stay nurse when you get to Short-Stay.  References:  [facility], 2007 "How to Manage your Diabetes Before and After Surgery".  What do I do about my diabetes medications?   Do not take oral diabetes medicines (pills) the morning of surgery- Metformin (Glicophage) or acarbose (precose) or glimepiride (amaryl)    Do not take other diabetes injectables the day of surgery including Byetta, Victoza, Bydureon, and Trulicity.   For patients with "Insulin Pumps":  Contact your diabetes doctor for specific instructions before surgery.   Decrease basal insulin rates by 20% at midnight the night before surgery.  Note that if your surgery is planned to be longer than 2 hours, your insulin pump will be removed and intravenous (IV) insulin will be started and managed by the nurses and anesthesiologist.  You will be able to restart your insulin pump once you are awake and able to manage it.  Make sure to bring  insulin pump supplies to the hospital with you in case your site needs to be changed.       Stop taking aspirin, Ibuprofen, Aleve, BC's, Goody's, Herbal medications, Fish Oil   Do not wear jewelry, make-up or nail polish.  Do not wear lotions, powders, or perfumes.  You may wear deodorant.  Do not shave 48 hours prior to surgery.  Men may shave face and neck.  Do not bring valuables to the hospital.  [facility] is not responsible for any belongings or valuables.  Contacts, dentures or bridgework may not be worn into surgery.   Leave your suitcase in the car.  After surgery it may be brought to your room.  For patients admitted to the hospital, discharge time will be determined by your treatment team.  Patients discharged the day of surgery will not be allowed to drive home.   * Special instructions:  [name] - Preparing for Surgery  Before surgery, you can play an important role.  Because skin is not sterile, your skin needs to be as free of germs as possible.  You can reduce the number of germs on you skin by washing with CHG (chlorahexidine gluconate) soap before surgery.  CHG is an antiseptic cleaner which kills germs and bonds with the skin to continue killing germs even after washing.  Please DO NOT use if you have an allergy to CHG or antibacterial soaps.  If your skin becomes reddened/irritated stop using the CHG and inform your nurse when you arrive at Short Stay.  Do not shave (including legs and underarms) for at least 48 hours prior to the first CHG shower.  You may shave your face.  Please follow these instructions carefully:   1.  Shower with CHG Soap the night before surgery and the  morning of Surgery.  2.  If you choose to wash your hair, wash your hair first as usual with your   normal shampoo.  3.  After you shampoo, rinse your hair and body thoroughly to remove the   Shampoo.  4.  Use CHG as you would any other liquid soap.  You can apply chg directly   to the skin and wash gently with scrungie or a clean washcloth.  5.  Apply the CHG Soap to your body ONLY FROM THE NECK DOWN.    Do not use on open wounds or open sores.  Avoid contact with your eyes,   ears, mouth and genitals (private parts).  Wash genitals (private parts)  with your normal soap.  6.  Wash thoroughly, paying special attention to the area where your surgery will be performed.  7.  Thoroughly rinse your body with warm water from the neck down.  8.  DO NOT shower/wash with your normal soap after using and rinsing off  the CHG  Soap.  9.  Pat yourself dry with a clean towel.            10.  Wear clean pajamas.            11.  Place clean sheets on your bed the night of your first shower and do not sleep with pets.  Day of Surgery  Do not apply any lotions/deoderants the morning of surgery.  Please wear clean clothes to the hospital/surgery center.     Please read over the following fact sheets that you were given. Pain Booklet, Coughing and Deep Breathing, MRSA Information and Surgical Site Infection Prevention

## 2015-01-29 NOTE — Progress Notes (Signed)
Pt arrived for PAT appointment.  

## 2015-01-29 NOTE — Progress Notes (Signed)
Not here for his 1500 PAT appointment. Called with no answer, message left for him to call us back.

## 2015-01-29 NOTE — Progress Notes (Signed)
PCP is Dr. Mirna Mires Cardiologist is Dr. Shirlee Latch Pt reports that he doesn't check his blood sugar. Echo noted in epic 12-16-14 Card cath noted from 10-21-12

## 2015-01-29 NOTE — Progress Notes (Signed)
Mupirocin Ointment Rx called into [facility] at [facility] for positive PCR of Staph. Left message on pt's voicemail informing him of results.

## 2015-01-30 LAB — HEMOGLOBIN A1C
Hgb A1c MFr Bld: 5.9 % — ABNORMAL HIGH (ref 4.8–5.6)
Mean Plasma Glucose: 123 mg/dL

## 2015-01-30 NOTE — Progress Notes (Signed)
Anesthesia Chart Review: Patient is a 74 year old male scheduled for L3-4, L4-5 laminectomy and foraminotomy on 02/02/15 by Dr. Lovell Sheehan.   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 Jude) 05/16/13 (Dr. Ladona Ridgel), aflutter, HTN, HLD, DM2, CKD stage III, anemia, GERD, venous insufficiency, right brachial-axillary vein DVT 10/06/11, s/p C3-4 ACDF 04/17/14 and C4-6 ACDF '13, THA, toe amputations in '03 and '12 (left 3rd, 4th). PCP is Dr. Mirna Mires.   Cardiologist is Dr. Shirlee Latch at [facility], last visit 12/16/14. His note states, "He would be higher risk, but given improvement in EF back to 45-50%, risk would not be prohibitive."  Medications include: ASA, carvedilol, Flonase, Lasix, gabapentin, hydralazine, Imdur, ivabradine, metformin, glimepiride, acarbose, pravastatin.   09/09/14 EKG: EKG shows NSR, LAFB, minimal voltage criteria for LVH, may be normal variant, lateral infarct, age undetermined. Appears stable since 04/10/14.   12/16/14 Echo: Normal LV size with mild LV hypertrophy. EF 45-50%. Wall motionabnormalities as described above. Normal RV Size and systolic function. Aortic sclerosis without significant stenosis. Mild MR.Mild to moderate pulmonary hypertension. (Previous EF 20-25% 03/19/13.)  11/06/12 LHC: Occluded RCA (chronic) with left to right collaterals to PDA/PLV territory. 90% stenosis in small LCx. Moderate Ramus with 50% proximal stenosis. 40% mid LAD. LAD provides collaterals to RCA territory. Recommendations: Occluded RCA with collaterals. 90% stenosis in a relatively small LCx. LCx is interventional target potentially but significant risk of compromising OM1 which is the main vessel fed by the LCx. I do not think this relatively small vessel will have much effect on his LV systolic function and he does not have chest pain. Therefore, will plan medical management at this time (Dr. Shirlee Latch,  11/07/12).  Preoperative labs reviewed. BUN/Cr 27/1.84 which appears overall stable. Glucose 186. A1C 5.9. H/H 12.2/37.2.    If no acute changes then I would anticipate that he could proceed as planned. PAT RN Darreld Mclean noted [employer]. Jude Rep Arlys John about surgery plans. Since patient will be prone, the rep will need to reprogram device pre- and post- operatively.  Velna Ochs [facility]/Anesthesiology Phone [phone] 01/30/2015 12:33 PM

## 2015-02-01 MED ORDER — CEFAZOLIN SODIUM-DEXTROSE 2-3 GM-% IV SOLR
2.0000 g | INTRAVENOUS | Status: AC
  Administered 2015-02-02: 2 g via INTRAVENOUS
  Filled 2015-02-01: qty 50

## 2015-02-02 ENCOUNTER — Inpatient Hospital Stay (HOSPITAL_COMMUNITY)
Admission: AD | Admit: 2015-02-02 | Discharge: 2015-02-05 | DRG: 515 | Disposition: A | Discharge status: Home / Self Care | Payer: Medicare Other | Source: Ambulatory Visit | Attending: Neurosurgery | Admitting: Neurosurgery

## 2015-02-02 ENCOUNTER — Ambulatory Visit (HOSPITAL_COMMUNITY): Payer: Medicare Other | Admitting: Vascular Surgery

## 2015-02-02 ENCOUNTER — Encounter (HOSPITAL_COMMUNITY)
Admission: AD | Disposition: A | Discharge status: Home / Self Care | Payer: Self-pay | Source: Ambulatory Visit | Attending: Neurosurgery

## 2015-02-02 ENCOUNTER — Encounter (HOSPITAL_COMMUNITY): Payer: Self-pay | Admitting: *Deleted

## 2015-02-02 ENCOUNTER — Ambulatory Visit (HOSPITAL_COMMUNITY): Payer: Medicare Other

## 2015-02-02 ENCOUNTER — Ambulatory Visit (HOSPITAL_COMMUNITY): Payer: Medicare Other | Admitting: Certified Registered Nurse Anesthetist

## 2015-02-02 DIAGNOSIS — Z79899 Other long term (current) drug therapy: Secondary | ICD-10-CM

## 2015-02-02 DIAGNOSIS — Z9889 Other specified postprocedural states: Secondary | ICD-10-CM | POA: Diagnosis not present

## 2015-02-02 DIAGNOSIS — Z881 Allergy status to other antibiotic agents status: Secondary | ICD-10-CM

## 2015-02-02 DIAGNOSIS — K219 Gastro-esophageal reflux disease without esophagitis: Secondary | ICD-10-CM | POA: Diagnosis not present

## 2015-02-02 DIAGNOSIS — G8250 Quadriplegia, unspecified: Secondary | ICD-10-CM | POA: Diagnosis not present

## 2015-02-02 DIAGNOSIS — Z981 Arthrodesis status: Secondary | ICD-10-CM | POA: Diagnosis not present

## 2015-02-02 DIAGNOSIS — M549 Dorsalgia, unspecified: Secondary | ICD-10-CM

## 2015-02-02 DIAGNOSIS — Z888 Allergy status to other drugs, medicaments and biological substances status: Secondary | ICD-10-CM

## 2015-02-02 DIAGNOSIS — I429 Cardiomyopathy, unspecified: Secondary | ICD-10-CM | POA: Diagnosis present

## 2015-02-02 DIAGNOSIS — K449 Diaphragmatic hernia without obstruction or gangrene: Secondary | ICD-10-CM | POA: Diagnosis not present

## 2015-02-02 DIAGNOSIS — I4892 Unspecified atrial flutter: Secondary | ICD-10-CM | POA: Diagnosis present

## 2015-02-02 DIAGNOSIS — I130 Hypertensive heart and chronic kidney disease with heart failure and stage 1 through stage 4 chronic kidney disease, or unspecified chronic kidney disease: Secondary | ICD-10-CM | POA: Diagnosis present

## 2015-02-02 DIAGNOSIS — F329 Major depressive disorder, single episode, unspecified: Secondary | ICD-10-CM | POA: Diagnosis present

## 2015-02-02 DIAGNOSIS — I255 Ischemic cardiomyopathy: Secondary | ICD-10-CM | POA: Diagnosis not present

## 2015-02-02 DIAGNOSIS — Z95810 Presence of automatic (implantable) cardiac defibrillator: Secondary | ICD-10-CM | POA: Diagnosis not present

## 2015-02-02 DIAGNOSIS — I5022 Chronic systolic (congestive) heart failure: Secondary | ICD-10-CM | POA: Diagnosis not present

## 2015-02-02 DIAGNOSIS — N183 Chronic kidney disease, stage 3 (moderate): Secondary | ICD-10-CM | POA: Diagnosis present

## 2015-02-02 DIAGNOSIS — Z89422 Acquired absence of other left toe(s): Secondary | ICD-10-CM

## 2015-02-02 DIAGNOSIS — E1141 Type 2 diabetes mellitus with diabetic mononeuropathy: Secondary | ICD-10-CM | POA: Diagnosis not present

## 2015-02-02 DIAGNOSIS — Z8249 Family history of ischemic heart disease and other diseases of the circulatory system: Secondary | ICD-10-CM

## 2015-02-02 DIAGNOSIS — M5416 Radiculopathy, lumbar region: Secondary | ICD-10-CM | POA: Diagnosis not present

## 2015-02-02 DIAGNOSIS — Z96641 Presence of right artificial hip joint: Secondary | ICD-10-CM | POA: Diagnosis not present

## 2015-02-02 DIAGNOSIS — M4806 Spinal stenosis, lumbar region: Secondary | ICD-10-CM | POA: Diagnosis not present

## 2015-02-02 DIAGNOSIS — E1122 Type 2 diabetes mellitus with diabetic chronic kidney disease: Secondary | ICD-10-CM | POA: Diagnosis not present

## 2015-02-02 DIAGNOSIS — D649 Anemia, unspecified: Secondary | ICD-10-CM | POA: Diagnosis not present

## 2015-02-02 DIAGNOSIS — I739 Peripheral vascular disease, unspecified: Secondary | ICD-10-CM | POA: Diagnosis present

## 2015-02-02 DIAGNOSIS — Z833 Family history of diabetes mellitus: Secondary | ICD-10-CM | POA: Diagnosis not present

## 2015-02-02 DIAGNOSIS — Z7982 Long term (current) use of aspirin: Secondary | ICD-10-CM

## 2015-02-02 DIAGNOSIS — M79606 Pain in leg, unspecified: Secondary | ICD-10-CM | POA: Diagnosis present

## 2015-02-02 DIAGNOSIS — I252 Old myocardial infarction: Secondary | ICD-10-CM

## 2015-02-02 DIAGNOSIS — I2510 Atherosclerotic heart disease of native coronary artery without angina pectoris: Secondary | ICD-10-CM | POA: Diagnosis not present

## 2015-02-02 DIAGNOSIS — M48062 Spinal stenosis, lumbar region with neurogenic claudication: Secondary | ICD-10-CM | POA: Diagnosis present

## 2015-02-02 DIAGNOSIS — M545 Low back pain: Secondary | ICD-10-CM | POA: Diagnosis not present

## 2015-02-02 HISTORY — PX: LUMBAR LAMINECTOMY/DECOMPRESSION MICRODISCECTOMY: SHX5026

## 2015-02-02 LAB — GLUCOSE, CAPILLARY
GLUCOSE-CAPILLARY: 98 mg/dL (ref 65–99)
Glucose-Capillary: 104 mg/dL — ABNORMAL HIGH (ref 65–99)
Glucose-Capillary: 86 mg/dL (ref 65–99)

## 2015-02-02 SURGERY — LUMBAR LAMINECTOMY/DECOMPRESSION MICRODISCECTOMY 2 LEVELS
Anesthesia: General | Site: Back

## 2015-02-02 MED ORDER — ALUM & MAG HYDROXIDE-SIMETH 200-200-20 MG/5ML PO SUSP: 30.0000 mL | Freq: Four times a day (QID) | ORAL | Status: DC | PRN

## 2015-02-02 MED ORDER — GLIMEPIRIDE 4 MG PO TABS: 2.0000 mg | ORAL_TABLET | Freq: Two times a day (BID) | ORAL | Status: DC

## 2015-02-02 MED ORDER — MENTHOL 3 MG MT LOZG
1.0000 | LOZENGE | OROMUCOSAL | Status: DC | PRN
Start: 2015-02-02 — End: 2015-02-05

## 2015-02-02 MED ORDER — FENTANYL CITRATE (PF) 100 MCG/2ML IJ SOLN
INTRAMUSCULAR | Status: DC | PRN
  Administered 2015-02-02 (×5): 50 ug via INTRAVENOUS

## 2015-02-02 MED ORDER — PROPOFOL 10 MG/ML IV BOLUS
INTRAVENOUS | Status: DC | PRN
  Administered 2015-02-02: 30 mg via INTRAVENOUS
  Administered 2015-02-02: 120 mg via INTRAVENOUS
  Administered 2015-02-02: 50 mg via INTRAVENOUS

## 2015-02-02 MED ORDER — CARVEDILOL 12.5 MG PO TABS
12.5000 mg | ORAL_TABLET | Freq: Two times a day (BID) | ORAL | Status: DC
  Administered 2015-02-03 – 2015-02-05 (×5): 12.5 mg via ORAL
  Filled 2015-02-02 (×5): qty 1

## 2015-02-02 MED ORDER — THROMBIN 5000 UNITS EX SOLR
CUTANEOUS | Status: DC | PRN
  Administered 2015-02-02 (×2): 5000 [IU] via TOPICAL

## 2015-02-02 MED ORDER — METFORMIN HCL 500 MG PO TABS: 500.0000 mg | ORAL_TABLET | Freq: Two times a day (BID) | ORAL | Status: DC

## 2015-02-02 MED ORDER — GABAPENTIN 300 MG PO CAPS
300.0000 mg | ORAL_CAPSULE | Freq: Three times a day (TID) | ORAL | Status: DC
  Administered 2015-02-02 – 2015-02-04 (×6): 300 mg via ORAL
  Filled 2015-02-02 (×6): qty 1

## 2015-02-02 MED ORDER — OXYCODONE-ACETAMINOPHEN 5-325 MG PO TABS
1.0000 | ORAL_TABLET | ORAL | Status: DC | PRN
  Administered 2015-02-03: 2 via ORAL
  Administered 2015-02-03: 1 via ORAL
  Administered 2015-02-04 – 2015-02-05 (×3): 2 via ORAL
  Filled 2015-02-02 (×2): qty 2
  Filled 2015-02-02: qty 1
  Filled 2015-02-02 (×2): qty 2

## 2015-02-02 MED ORDER — GLYCOPYRROLATE 0.2 MG/ML IJ SOLN
INTRAMUSCULAR | Status: DC | PRN
  Administered 2015-02-02: 0.6 mg via INTRAVENOUS

## 2015-02-02 MED ORDER — LIDOCAINE HCL (CARDIAC) 20 MG/ML IV SOLN
INTRAVENOUS | Status: AC
  Filled 2015-02-02: qty 5

## 2015-02-02 MED ORDER — LACTATED RINGERS IV SOLN
INTRAVENOUS | Status: DC
  Administered 2015-02-02 (×2): via INTRAVENOUS

## 2015-02-02 MED ORDER — ACARBOSE 50 MG PO TABS
50.0000 mg | ORAL_TABLET | Freq: Three times a day (TID) | ORAL | Status: DC
  Administered 2015-02-03 – 2015-02-05 (×6): 50 mg via ORAL
  Filled 2015-02-02 (×10): qty 1

## 2015-02-02 MED ORDER — MUPIROCIN 2 % EX OINT
1.0000 "application " | TOPICAL_OINTMENT | Freq: Two times a day (BID) | CUTANEOUS | Status: DC
  Administered 2015-02-02 – 2015-02-04 (×5): 1 via NASAL
  Filled 2015-02-02: qty 22

## 2015-02-02 MED ORDER — ACETAMINOPHEN 325 MG PO TABS
650.0000 mg | ORAL_TABLET | ORAL | Status: DC | PRN
  Administered 2015-02-04: 650 mg via ORAL
  Filled 2015-02-02: qty 2

## 2015-02-02 MED ORDER — FUROSEMIDE 40 MG PO TABS: 40.0000 mg | ORAL_TABLET | ORAL | Status: DC

## 2015-02-02 MED ORDER — SUCCINYLCHOLINE CHLORIDE 20 MG/ML IJ SOLN
INTRAMUSCULAR | Status: AC
  Filled 2015-02-02: qty 1

## 2015-02-02 MED ORDER — BUPIVACAINE LIPOSOME 1.3 % IJ SUSP
INTRAMUSCULAR | Status: DC | PRN
  Administered 2015-02-02: 20 mL

## 2015-02-02 MED ORDER — DIAZEPAM 5 MG PO TABS
ORAL_TABLET | ORAL | Status: AC
  Filled 2015-02-02: qty 1

## 2015-02-02 MED ORDER — HEMOSTATIC AGENTS (NO CHARGE) OPTIME
TOPICAL | Status: DC | PRN
  Administered 2015-02-02: 1 via TOPICAL

## 2015-02-02 MED ORDER — DOCUSATE SODIUM 100 MG PO CAPS
100.0000 mg | ORAL_CAPSULE | Freq: Two times a day (BID) | ORAL | Status: DC
  Administered 2015-02-02 – 2015-02-05 (×6): 100 mg via ORAL
  Filled 2015-02-02 (×6): qty 1

## 2015-02-02 MED ORDER — CISATRACURIUM BESYLATE (PF) 10 MG/5ML IV SOLN
INTRAVENOUS | Status: DC | PRN
  Administered 2015-02-02: 2 mg via INTRAVENOUS
  Administered 2015-02-02 (×2): 3 mg via INTRAVENOUS
  Administered 2015-02-02: 2 mg via INTRAVENOUS
  Administered 2015-02-02: 3 mg via INTRAVENOUS
  Administered 2015-02-02: 5 mg via INTRAVENOUS

## 2015-02-02 MED ORDER — LIDOCAINE HCL (CARDIAC) 20 MG/ML IV SOLN
INTRAVENOUS | Status: DC | PRN
  Administered 2015-02-02: 50 mg via INTRAVENOUS

## 2015-02-02 MED ORDER — PHENYLEPHRINE 40 MCG/ML (10ML) SYRINGE FOR IV PUSH (FOR BLOOD PRESSURE SUPPORT)
PREFILLED_SYRINGE | INTRAVENOUS | Status: AC
  Filled 2015-02-02: qty 10

## 2015-02-02 MED ORDER — HYDROMORPHONE HCL 1 MG/ML IJ SOLN
0.2500 mg | INTRAMUSCULAR | Status: DC | PRN
  Administered 2015-02-02 (×4): 0.5 mg via INTRAVENOUS

## 2015-02-02 MED ORDER — PRAVASTATIN SODIUM 40 MG PO TABS
40.0000 mg | ORAL_TABLET | Freq: Every day | ORAL | Status: DC
  Administered 2015-02-02 – 2015-02-04 (×3): 40 mg via ORAL
  Filled 2015-02-02 (×3): qty 1

## 2015-02-02 MED ORDER — MEPERIDINE HCL 25 MG/ML IJ SOLN: 6.2500 mg | INTRAMUSCULAR | Status: DC | PRN

## 2015-02-02 MED ORDER — DIAZEPAM 5 MG PO TABS
5.0000 mg | ORAL_TABLET | Freq: Four times a day (QID) | ORAL | Status: DC | PRN
  Administered 2015-02-02 – 2015-02-03 (×2): 5 mg via ORAL
  Filled 2015-02-02: qty 1

## 2015-02-02 MED ORDER — PROMETHAZINE HCL 25 MG/ML IJ SOLN: 6.2500 mg | INTRAMUSCULAR | Status: DC | PRN

## 2015-02-02 MED ORDER — ACETAMINOPHEN 650 MG RE SUPP: 650.0000 mg | RECTAL | Status: DC | PRN

## 2015-02-02 MED ORDER — IVABRADINE HCL 5 MG PO TABS
5.0000 mg | ORAL_TABLET | Freq: Two times a day (BID) | ORAL | Status: DC
  Administered 2015-02-03 – 2015-02-05 (×5): 5 mg via ORAL
  Filled 2015-02-02 (×7): qty 1

## 2015-02-02 MED ORDER — ONDANSETRON HCL 4 MG/2ML IJ SOLN
INTRAMUSCULAR | Status: DC | PRN
  Administered 2015-02-02: 4 mg via INTRAVENOUS

## 2015-02-02 MED ORDER — HYDROMORPHONE HCL 1 MG/ML IJ SOLN
INTRAMUSCULAR | Status: AC
  Filled 2015-02-02: qty 1

## 2015-02-02 MED ORDER — 0.9 % SODIUM CHLORIDE (POUR BTL) OPTIME
TOPICAL | Status: DC | PRN
  Administered 2015-02-02: 1000 mL

## 2015-02-02 MED ORDER — BUPIVACAINE-EPINEPHRINE (PF) 0.5% -1:200000 IJ SOLN
INTRAMUSCULAR | Status: DC | PRN
  Administered 2015-02-02: 10 mL via PERINEURAL

## 2015-02-02 MED ORDER — SODIUM CHLORIDE 0.9 % IV SOLN
0.5000 ug/kg/min | INTRAVENOUS | Status: DC
  Filled 2015-02-02 (×2): qty 20

## 2015-02-02 MED ORDER — BUPIVACAINE LIPOSOME 1.3 % IJ SUSP
20.0000 mL | Freq: Once | INTRAMUSCULAR | Status: DC
Start: 2015-02-02 — End: 2015-02-03
  Filled 2015-02-02: qty 20

## 2015-02-02 MED ORDER — HYDRALAZINE HCL 25 MG PO TABS
12.5000 mg | ORAL_TABLET | Freq: Three times a day (TID) | ORAL | Status: DC
  Administered 2015-02-02 – 2015-02-05 (×7): 12.5 mg via ORAL
  Filled 2015-02-02 (×7): qty 1

## 2015-02-02 MED ORDER — PHENYLEPHRINE HCL 1 % NA SOLN
1.0000 [drp] | Freq: Four times a day (QID) | NASAL | Status: DC | PRN
  Filled 2015-02-02: qty 15

## 2015-02-02 MED ORDER — PHENYLEPHRINE HCL 10 MG/ML IJ SOLN
10.0000 mg | INTRAVENOUS | Status: DC | PRN
  Administered 2015-02-02: 10 ug/min via INTRAVENOUS

## 2015-02-02 MED ORDER — PROPOFOL 10 MG/ML IV BOLUS
INTRAVENOUS | Status: AC
  Filled 2015-02-02: qty 20

## 2015-02-02 MED ORDER — BACITRACIN ZINC 500 UNIT/GM EX OINT
TOPICAL_OINTMENT | CUTANEOUS | Status: DC | PRN
  Administered 2015-02-02: 1 via TOPICAL

## 2015-02-02 MED ORDER — MORPHINE SULFATE (PF) 2 MG/ML IV SOLN: 1.0000 mg | INTRAVENOUS | Status: DC | PRN

## 2015-02-02 MED ORDER — BISACODYL 10 MG RE SUPP: 10.0000 mg | Freq: Every day | RECTAL | Status: DC | PRN

## 2015-02-02 MED ORDER — SODIUM CHLORIDE 0.9 % IR SOLN
Status: DC | PRN
  Administered 2015-02-02: 500 mL

## 2015-02-02 MED ORDER — EPHEDRINE SULFATE 50 MG/ML IJ SOLN
INTRAMUSCULAR | Status: AC
  Filled 2015-02-02: qty 1

## 2015-02-02 MED ORDER — PHENOL 1.4 % MT LIQD
1.0000 | OROMUCOSAL | Status: DC | PRN
Start: 2015-02-02 — End: 2015-02-05

## 2015-02-02 MED ORDER — CEFAZOLIN SODIUM-DEXTROSE 2-3 GM-% IV SOLR
2.0000 g | Freq: Three times a day (TID) | INTRAVENOUS | Status: AC
  Administered 2015-02-02 – 2015-02-03 (×2): 2 g via INTRAVENOUS
  Filled 2015-02-02 (×2): qty 50

## 2015-02-02 MED ORDER — NEOSTIGMINE METHYLSULFATE 10 MG/10ML IV SOLN
INTRAVENOUS | Status: DC | PRN
  Administered 2015-02-02: 4 mg via INTRAVENOUS

## 2015-02-02 MED ORDER — LACTATED RINGERS IV SOLN
INTRAVENOUS | Status: DC
  Administered 2015-02-02: 16:00:00 via INTRAVENOUS

## 2015-02-02 MED ORDER — ONDANSETRON HCL 4 MG/2ML IJ SOLN
INTRAMUSCULAR | Status: AC
  Filled 2015-02-02: qty 2

## 2015-02-02 MED ORDER — ISOSORBIDE MONONITRATE ER 30 MG PO TB24
30.0000 mg | ORAL_TABLET | Freq: Every day | ORAL | Status: DC
  Administered 2015-02-03 – 2015-02-05 (×3): 30 mg via ORAL
  Filled 2015-02-02 (×3): qty 1

## 2015-02-02 MED ORDER — ONDANSETRON HCL 4 MG/2ML IJ SOLN: 4.0000 mg | INTRAMUSCULAR | Status: DC | PRN

## 2015-02-02 MED ORDER — HYDROCODONE-ACETAMINOPHEN 10-325 MG PO TABS: 1.0000 | ORAL_TABLET | ORAL | Status: DC | PRN

## 2015-02-02 MED ORDER — INSULIN ASPART 100 UNIT/ML ~~LOC~~ SOLN
0.0000 [IU] | SUBCUTANEOUS | Status: DC
  Administered 2015-02-03: 4 [IU] via SUBCUTANEOUS
  Administered 2015-02-03: 7 [IU] via SUBCUTANEOUS
  Administered 2015-02-04: 15 [IU] via SUBCUTANEOUS
  Administered 2015-02-04: 4 [IU] via SUBCUTANEOUS
  Administered 2015-02-04: 7 [IU] via SUBCUTANEOUS
  Administered 2015-02-05 (×2): 4 [IU] via SUBCUTANEOUS

## 2015-02-02 MED ORDER — GUAIFENESIN ER 600 MG PO TB12
600.0000 mg | ORAL_TABLET | Freq: Two times a day (BID) | ORAL | Status: DC
  Administered 2015-02-02 – 2015-02-05 (×6): 600 mg via ORAL
  Filled 2015-02-02 (×6): qty 1

## 2015-02-02 MED ORDER — FENTANYL CITRATE (PF) 250 MCG/5ML IJ SOLN
INTRAMUSCULAR | Status: AC
  Filled 2015-02-02: qty 5

## 2015-02-02 MED ORDER — FUROSEMIDE 40 MG PO TABS
40.0000 mg | ORAL_TABLET | Freq: Every day | ORAL | Status: DC
  Administered 2015-02-02 – 2015-02-05 (×4): 40 mg via ORAL
  Filled 2015-02-02 (×4): qty 1

## 2015-02-02 MED ORDER — HYDROCODONE-ACETAMINOPHEN 5-325 MG PO TABS
1.0000 | ORAL_TABLET | ORAL | Status: DC | PRN
  Administered 2015-02-02: 1 via ORAL
  Filled 2015-02-02: qty 1

## 2015-02-02 MED ORDER — ROCURONIUM BROMIDE 50 MG/5ML IV SOLN
INTRAVENOUS | Status: AC
  Filled 2015-02-02: qty 1

## 2015-02-02 SURGICAL SUPPLY — 50 items
APL SKNCLS STERI-STRIP NONHPOA (GAUZE/BANDAGES/DRESSINGS) ×1
BAG DECANTER FOR FLEXI CONT (MISCELLANEOUS) ×3 IMPLANT
BENZOIN TINCTURE PRP APPL 2/3 (GAUZE/BANDAGES/DRESSINGS) ×3 IMPLANT
BLADE CLIPPER SURG (BLADE) IMPLANT
BRUSH SCRUB EZ PLAIN DRY (MISCELLANEOUS) ×3 IMPLANT
BUR MATCHSTICK NEURO 3.0 LAGG (BURR) ×3 IMPLANT
BUR PRECISION FLUTE 6.0 (BURR) ×3 IMPLANT
CANISTER SUCT 3000ML PPV (MISCELLANEOUS) ×3 IMPLANT
CLOSURE WOUND 1/2 X4 (GAUZE/BANDAGES/DRESSINGS) ×1
DRAPE LAPAROTOMY 100X72X124 (DRAPES) ×3 IMPLANT
DRAPE MICROSCOPE LEICA (MISCELLANEOUS) ×3 IMPLANT
DRAPE POUCH INSTRU U-SHP 10X18 (DRAPES) ×3 IMPLANT
DRAPE SURG 17X23 STRL (DRAPES) ×12 IMPLANT
ELECT BLADE 4.0 EZ CLEAN MEGAD (MISCELLANEOUS) ×3
ELECT REM PT RETURN 9FT ADLT (ELECTROSURGICAL) ×3
ELECTRODE BLDE 4.0 EZ CLN MEGD (MISCELLANEOUS) ×1 IMPLANT
ELECTRODE REM PT RTRN 9FT ADLT (ELECTROSURGICAL) ×1 IMPLANT
GAUZE SPONGE 4X4 12PLY STRL (GAUZE/BANDAGES/DRESSINGS) ×3 IMPLANT
GAUZE SPONGE 4X4 16PLY XRAY LF (GAUZE/BANDAGES/DRESSINGS) IMPLANT
GLOVE BIO SURGEON STRL SZ8 (GLOVE) ×3 IMPLANT
GLOVE BIO SURGEON STRL SZ8.5 (GLOVE) ×3 IMPLANT
GLOVE EXAM NITRILE LRG STRL (GLOVE) IMPLANT
GLOVE EXAM NITRILE MD LF STRL (GLOVE) IMPLANT
GLOVE EXAM NITRILE XL STR (GLOVE) IMPLANT
GLOVE EXAM NITRILE XS STR PU (GLOVE) IMPLANT
GOWN STRL REUS W/ TWL LRG LVL3 (GOWN DISPOSABLE) IMPLANT
GOWN STRL REUS W/ TWL XL LVL3 (GOWN DISPOSABLE) ×1 IMPLANT
GOWN STRL REUS W/TWL 2XL LVL3 (GOWN DISPOSABLE) IMPLANT
GOWN STRL REUS W/TWL LRG LVL3 (GOWN DISPOSABLE)
GOWN STRL REUS W/TWL XL LVL3 (GOWN DISPOSABLE) ×3
KIT BASIN OR (CUSTOM PROCEDURE TRAY) ×3 IMPLANT
KIT ROOM TURNOVER OR (KITS) ×3 IMPLANT
NDL HYPO 21X1.5 SAFETY (NEEDLE) IMPLANT
NEEDLE HYPO 21X1.5 SAFETY (NEEDLE) IMPLANT
NEEDLE HYPO 22GX1.5 SAFETY (NEEDLE) ×3 IMPLANT
NS IRRIG 1000ML POUR BTL (IV SOLUTION) ×3 IMPLANT
PACK LAMINECTOMY NEURO (CUSTOM PROCEDURE TRAY) ×3 IMPLANT
PAD ARMBOARD 7.5X6 YLW CONV (MISCELLANEOUS) ×9 IMPLANT
PATTIES SURGICAL .5 X1 (DISPOSABLE) IMPLANT
RUBBERBAND STERILE (MISCELLANEOUS) ×6 IMPLANT
SPONGE SURGIFOAM ABS GEL SZ50 (HEMOSTASIS) ×3 IMPLANT
STRIP CLOSURE SKIN 1/2X4 (GAUZE/BANDAGES/DRESSINGS) ×2 IMPLANT
SUT VIC AB 1 CT1 18XBRD ANBCTR (SUTURE) ×2 IMPLANT
SUT VIC AB 1 CT1 8-18 (SUTURE) ×6
SUT VIC AB 2-0 CP2 18 (SUTURE) ×6 IMPLANT
TAPE CLOTH SURG 4X10 WHT LF (GAUZE/BANDAGES/DRESSINGS) ×2 IMPLANT
TAPE STRIPS DRAPE STRL (GAUZE/BANDAGES/DRESSINGS) ×2 IMPLANT
TOWEL OR 17X24 6PK STRL BLUE (TOWEL DISPOSABLE) ×3 IMPLANT
TOWEL OR 17X26 10 PK STRL BLUE (TOWEL DISPOSABLE) ×3 IMPLANT
WATER STERILE IRR 1000ML POUR (IV SOLUTION) ×3 IMPLANT

## 2015-02-02 NOTE — H&P (Signed)
Subjective: The patient is a 74 year old white male with a significant cardiac history. I performed a anterior cervical discectomy fusion and plating on him for a cervical myelopathy in January. He has recovered from that surgery but has had back, buttock, and leg pain persisted with neurogenic claudication. He has failed medical management and was worked up with a lumbar MRI which demonstrates spinal stenosis at L3-4 and L4-5. I discussed the various treatment options with the patient including surgery. He has decided proceed with a lumbar laminectomy.   Past Medical History  Diagnosis Date  . Hypertension   . Hyperlipidemia   . Cervical spinal stenosis   . Lumbar spinal stenosis   . 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 DVT (deep venous thrombosis) (HCC) 10/2011    RUE; pt denies this hx on 06/02/13  . Anemia   . Cardiomyopathy (HCC)     a. 10/2012 Echo: EF 20-25%, diff HK, mod dil LA/RV/RA.  Marland Kitchen CAD (coronary artery disease)   . Atrial flutter (HCC)   . CKD (chronic kidney disease), stage III   . Venous insufficiency   . Frequent falls   . 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 Chronic systolic congestive heart failure (HCC)   . Quadriparesis (HCC) 08/11/11    pre op  . Hypoglycemia   . Cervical myelopathy (HCC)   . Acute encephalopathy   . Mononeuritis of unspecified site   . Dysrhythmia   . H/O hiatal hernia   . Arthritis     "all over" (02-Jun-2013)  . Depression     "son died in service in 11-Aug-1990; still bothers me" (2013/06/02)  . AICD (automatic cardioverter/defibrillator) present   . Shortness of breath dyspnea     with exertion  . Pneumonia 1960    , also 2014  . Type II diabetes mellitus (HCC)     type 2  . GERD (gastroesophageal reflux disease)   . Myocardial infarction [facility])     Past Surgical History  Procedure Laterality Date  . Total hip arthroplasty Right ~ 08/11/2010  . Toe amputation Left 12/2001; 06/2010    "2 toes"  (06/02/2013)  . Anterior cervical decomp/discectomy fusion  10/31/2011    Procedure: ANTERIOR CERVICAL DECOMPRESSION/DISCECTOMY FUSION 2 LEVELS;  Surgeon: Cristi Loron, MD;  Location: [facility];  Service: Neurosurgery;  Laterality: N/A;  Cervical Four-Five Cervical Five-Six Anterior Cervical Decompression with fusion interbody prothesis plating and bonegraft  . Carpal tunnel release Right ~ 2011/08/11  . Bi-ventricular implantable cardioverter defibrillator  (crt-d) Left 06/02/13    STJ CRTD implanted by Dr Ladona Ridgel for primary prevention/CHF  . Cataract extraction w/ intraocular lens implant Bilateral   . Cardiac catheterization  10/2012  . Left and right heart catheterization with coronary angiogram N/A 11/06/2012    Procedure: LEFT AND RIGHT HEART CATHETERIZATION WITH CORONARY ANGIOGRAM;  Surgeon: Laurey Morale, MD;  Location: [facility] CATH LAB;  Service: Cardiovascular;  Laterality: N/A;  . Bi-ventricular implantable cardioverter defibrillator N/A 2013/06/02    Procedure: BI-VENTRICULAR IMPLANTABLE CARDIOVERTER DEFIBRILLATOR  (CRT-D);  Surgeon: Marinus Maw, MD;  Location: [facility] CATH LAB;  Service: Cardiovascular;  Laterality: N/A;  . Joint replacement  Aug 11, 2011  . Anterior cervical decomp/discectomy fusion N/A 04/17/2014    Procedure: CERVICAL THREE TO FOUR ANTERIOR CERVICAL DECOMPRESSION/DISCECTOMY FUSION 1 LEVEL/HARDWARE REMOVAL;  Surgeon: Tressie Stalker, MD;  Location: [facility];  Service: Neurosurgery;  Laterality: N/A;  C34 anterior cervical decompression with fusion interbody  prosthesis plating and bonegraft with exploration of prev fusion and possible removal of old hardware  . Toe removal  Left     3rd and 4th toe removed    Allergies  Allergen Reactions  . Acyclovir And Related   . Dristan Cold [Chlorphen-Pe-Acetaminophen] Anxiety    Makes pt feel "wired up"  . Prednisone Other (See Comments)    Messes with blood sugar levels     Social History  Substance Use Topics  . Smoking status:  Never Smoker   . Smokeless tobacco: Never Used  . Alcohol Use: No     Comment: 05/16/2013 "used to drink a little bit a long time ago; nothing in over 30 yrs"    Family History  Problem Relation Age of Onset  . Diabetes Brother   . Cancer Father     Lung cancer  . Heart attack Mother     pt thinks she had MI  . Cancer Mother    Prior to Admission medications   Medication Sig Start Date End Date Taking? Authorizing Provider  acarbose (PRECOSE) 50 MG tablet Take 50 mg by mouth 3 (three) times daily with meals. Take 1 tablet 1-3 times a day   Yes Historical Provider, MD  aspirin EC 81 MG tablet Take 1 tablet (81 mg total) by mouth daily. 12/16/14  Yes Laurey Morale, MD  carvedilol (COREG) 12.5 MG tablet Take 1 tablet (12.5 mg total) by mouth 2 (two) times daily with a meal. 04/14/14  Yes Amy D Clegg, NP  docusate sodium (COLACE) 100 MG capsule Take 300 mg by mouth 2 (two) times daily.    Yes Historical Provider, MD  fluticasone (FLONASE) 50 MCG/ACT nasal spray Place 2 sprays into the nose daily as needed.    Yes Historical Provider, MD  furosemide (LASIX) 40 MG tablet Take 40 mg by mouth daily.    Yes Historical Provider, MD  gabapentin (NEURONTIN) 300 MG capsule Take 300 mg by mouth 3 (three) times daily. 11/09/12  Yes Christiane Ha, MD  glimepiride (AMARYL) 2 MG tablet Take 2 mg by mouth 2 (two) times daily.   Yes Historical Provider, MD  hydrALAZINE (APRESOLINE) 25 MG tablet TAKE ONE-HALF TABLET BY MOUTH THREE TIMES DAILY 10/30/14  Yes Dolores Patty, MD  HYDROcodone-acetaminophen (NORCO) 10-325 MG per tablet Take 1 tablet by mouth as needed. For pain 08/18/14  Yes Historical Provider, MD  isosorbide mononitrate (IMDUR) 30 MG 24 hr tablet TAKE ONE TABLET BY MOUTH ONCE DAILY 01/09/15  Yes Dolores Patty, MD  ivabradine HCl (CORLANOR) 5 MG TABS tablet Take 1 tablet (5 mg total) by mouth 2 (two) times daily with a meal. 01/06/14  Yes Laurey Morale, MD  metFORMIN (GLUCOPHAGE) 500 MG  tablet Take 500 mg by mouth 2 (two) times daily with a meal.    Yes Historical Provider, MD  mupirocin ointment (BACTROBAN) 2 % Place 1 application into the nose 2 (two) times daily. 01/30/15 02/03/15 Yes Historical Provider, MD  phenylephrine (NEO-SYNEPHRINE) 0.25 % nasal spray Place 1 spray into both nostrils every 6 (six) hours as needed for congestion.   Yes Historical Provider, MD  phenylephrine (NEO-SYNEPHRINE) 1 % nasal spray Place 1 drop into both nostrils every 6 (six) hours as needed for congestion.   Yes Historical Provider, MD  pravastatin (PRAVACHOL) 40 MG tablet Take 1 tablet (40 mg total) by mouth at bedtime. 01/31/13  Yes Sharon Seller, NP  furosemide (LASIX) 40 MG tablet TAKE ONE  TABLET BY MOUTH EVERY OTHER DAY 01/19/15   Amy D Clegg, NP  furosemide (LASIX) 40 MG tablet TAKE ONE TABLET BY MOUTH EVERY OTHER DAY 01/29/15   Amy D Clegg, NP  guaiFENesin (MUCINEX) 600 MG 12 hr tablet Take 600 mg by mouth 2 (two) times daily as needed.     Historical Provider, MD  isosorbide mononitrate (IMDUR) 30 MG 24 hr tablet TAKE ONE TABLET BY MOUTH ONCE DAILY 11/25/13   Dolores Patty, MD  magnesium hydroxide (MILK OF MAGNESIA) 400 MG/5ML suspension Take 30 mL by mouth daily as needed for constipation.    Historical Provider, MD  silver sulfADIAZINE (SILVADENE) 1 % cream Apply 1 application topically. For infection of the skin 08/07/14   Historical Provider, MD     Review of Systems  Positive ROS: As above  All other systems have been reviewed and were otherwise negative with the exception of those mentioned in the HPI and as above.  Objective: Vital signs in last 24 hours: Temp:  [97.1 F (36.2 C)] 97.1 F (36.2 C) (10/31 0959) Pulse Rate:  [73] 73 (10/31 0959) Resp:  [20] 20 (10/31 0959) BP: (164)/(80) 164/80 mmHg (10/31 0959) SpO2:  [100 %] 100 % (10/31 0959) Weight:  [83.7 kg (184 lb 8.4 oz)] 83.7 kg (184 lb 8.4 oz) (10/31 0959)  General Appearance: Alert, cooperative, no  distress, Head: Normocephalic, without obvious abnormality, atraumatic Eyes: PERRL, conjunctiva/corneas clear, EOM's intact,    Ears: Normal  Throat: Normal  Neck: Supple, symmetrical, trachea midline, no adenopathy; thyroid: No enlargement/tenderness/nodules; no carotid bruit or JVD. The patient's cervical incision is well-healed. Back: Symmetric, no curvature, ROM normal, no CVA tenderness Lungs: Clear to auscultation bilaterally, respirations unlabored Heart: Regular rate and rhythm, no murmur, rub or gallop Abdomen: Soft, non-tender,, no masses, no organomegaly Extremities: Extremities normal, atraumatic, no cyanosis or edema Pulses: 2+ and symmetric all extremities Skin: Skin color, texture, turgor normal, no rashes or lesions  NEUROLOGIC:   Mental status: alert and oriented, no aphasia, good attention span, Fund of knowledge/ memory ok Motor Exam - grossly normal Sensory Exam - grossly normal Reflexes:  Coordination - grossly normal Gait - grossly normal Balance - grossly normal Cranial Nerves: I: smell Not tested  II: visual acuity  OS: Normal  OD: Normal   II: visual fields Full to confrontation  II: pupils Equal, round, reactive to light  III,VII: ptosis None  III,IV,VI: extraocular muscles  Full ROM  V: mastication Normal  V: facial light touch sensation  Normal  V,VII: corneal reflex  Present  VII: facial muscle function - upper  Normal  VII: facial muscle function - lower Normal  VIII: hearing Not tested  IX: soft palate elevation  Normal  IX,X: gag reflex Present  XI: trapezius strength  5/5  XI: sternocleidomastoid strength 5/5  XI: neck flexion strength  5/5  XII: tongue strength  Normal    Data Review Lab Results  Component Value Date   WBC 8.2 01/29/2015   HGB 12.2* 01/29/2015   HCT 37.2* 01/29/2015   MCV 95.4 01/29/2015   PLT 201 01/29/2015   Lab Results  Component Value Date   NA 142 01/29/2015   K 4.4 01/29/2015   CL 103 01/29/2015   CO2  25 01/29/2015   BUN 27* 01/29/2015   CREATININE 1.84* 01/29/2015   GLUCOSE 186* 01/29/2015   Lab Results  Component Value Date   INR 1.51* 10/29/2012    Assessment/Plan: L3-4 and L4-5 spinal stenosis, lumbago,  lumbar radiculopathy, neurogenic claudication: I have discussed the situation with the patient and his wife. I have reviewed his imaging studies with him and pointed out the abnormalities. We have discussed the various treatment options including surgery. I have described the surgical treatment option of an L3-4 and L4-5 laminectomy. I have shown him surgical models. We have discussed the risks, benefits, alternatives, and likelihood of achieving her goals with surgery. I have answered all the patient's questions. He has decided proceed with surgery. We have obtained cardiac clearance.   [name] D 02/02/2015 11:29 AM

## 2015-02-02 NOTE — Anesthesia Preprocedure Evaluation (Addendum)
Anesthesia Evaluation  Patient identified by MRN, date of birth, ID band Patient awake    Reviewed: Allergy & Precautions, NPO status , Patient's Chart, lab work & pertinent test results  History of Anesthesia Complications Negative for: history of anesthetic complications  Airway Mallampati: III  TM Distance: >3 FB Neck ROM: Limited    Dental  (+) Dental Advisory Given, Poor Dentition, Missing, Chipped   Pulmonary shortness of breath and with exertion, pneumonia,    breath sounds clear to auscultation       Cardiovascular hypertension, Pt. on medications + CAD, + Past MI, + Peripheral Vascular Disease and +CHF  + dysrhythmias + Cardiac Defibrillator  Rhythm:Regular Rate:Normal  Prior EF 20-25%. Combined ischemic and non-ischemic cardiomyopathy. Chronically occluded RCA and 90% stenosis LCx.  Echo 12/2014 - Left ventricle: The cavity size was normal. Wall thickness wasincreased in a pattern of mild LVH. Basal anterolateral, basalinferior, and basal to mid inferolateral akinesis. Systolicfunction was mildly reduced. The estimated ejection fraction wasin the range of 45% to 50%. Doppler parameters are consistentwith abnormal left ventricular relaxation (grade 1 diastolicdysfunction). - Aortic valve: Trileaflet; moderately calcified leaflets. Sclerosis without stenosis. - Mitral valve: Moderately calcified annulus. There was mildregurgitation. - Left atrium: The atrium was mildly dilated. - Right ventricle: The cavity size was normal. Pacer wire orcatheter noted in right ventricle. Systolic function was normal. - Pulmonary arteries: PA peak pressure: 50 mm Hg (S). - Inferior vena cava: The vessel was normal in size. The respirophasic diameter changes were in the normal range (>= 50%),consistent with normal central venous pressure.  Impressions: - Normal LV size with mild LV hypertrophy. EF 45-50%. Wall  motionabnormalities as described above. Normal RV Size and systolicfunction. Aortic sclerosis without significant stenosis. Mild MR.Mild to moderate pulmonary hypertension.  Pacer/ICD interrogation 12/2011 >99% bi-v pacing   Neuro/Psych PSYCHIATRIC DISORDERS Depression  Neuromuscular disease (CRPS)    GI/Hepatic Neg liver ROS, hiatal hernia, GERD  Controlled,  Endo/Other  diabetes, Type 2, Oral Hypoglycemic Agents  Renal/GU CRFRenal disease     Musculoskeletal  (+) Arthritis ,   Abdominal   Peds  Hematology  (+) anemia ,   Anesthesia Other Findings   Reproductive/Obstetrics                           Anesthesia Physical  Anesthesia Plan  ASA: IV  Anesthesia Plan: General   Post-op Pain Management:    Induction: Intravenous  Airway Management Planned: Oral ETT  Additional Equipment: Arterial line  Intra-op Plan:   Post-operative Plan: Extubation in OR  Informed Consent: I have reviewed the patients History and Physical, chart, labs and discussed the procedure including the risks, benefits and alternatives for the proposed anesthesia with the patient or authorized representative who has indicated his/her understanding and acceptance.   Dental advisory given  Plan Discussed with: CRNA  Anesthesia Plan Comments:         Anesthesia Quick Evaluation

## 2015-02-02 NOTE — Progress Notes (Signed)
Subjective:  The patient is somnolent but arousable.  Objective: Vital signs in last 24 hours: Temp:  [97.1 F (36.2 C)] 97.1 F (36.2 C) (10/31 0959) Pulse Rate:  [73] 73 (10/31 0959) Resp:  [20] 20 (10/31 0959) BP: (164)/(80) 164/80 mmHg (10/31 0959) SpO2:  [100 %] 100 % (10/31 0959) Weight:  [83.7 kg (184 lb 8.4 oz)] 83.7 kg (184 lb 8.4 oz) (10/31 0959)  Intake/Output from previous day:   Intake/Output this shift: Total I/O In: 900 [I.V.:900] Out: 100 [Blood:100]  Physical exam the patient is somnolent but arousable. He is moving his lower extremities.  Lab Results: No results for input(s): WBC, HGB, HCT, PLT in the last 72 hours. BMET No results for input(s): NA, K, CL, CO2, GLUCOSE, BUN, CREATININE, CALCIUM in the last 72 hours.  Studies/Results: No results found.  Assessment/Plan: The patient is doing well. I spoke with his wife.      Calixto Pavel D 02/02/2015, 3:20 PM

## 2015-02-02 NOTE — Transfer of Care (Signed)
Immediate Anesthesia Transfer of Care Note  Patient: [name]  Procedure(s) Performed: Procedure(s) with comments: LUMBAR LAMINECTOMY/DECOMPRESSION MICRODISCECTOMY 2 LEVELS (N/A) - L34 L45 laminectomy and foraminotomy  Patient Location: PACU  Anesthesia Type:General  Level of Consciousness: awake and patient cooperative  Airway & Oxygen Therapy: Patient Spontanous Breathing and Patient connected to nasal cannula oxygen  Post-op Assessment: Report given to RN, Post -op Vital signs reviewed and stable and Patient moving all extremities X 4  Post vital signs: stable  Last Vitals:  Filed Vitals:   02/02/15 0959  BP: 164/80  Pulse: 73  Temp: 36.2 C  Resp: 20    Complications: No apparent anesthesia complications

## 2015-02-02 NOTE — Op Note (Signed)
Brief history: The patient is a 74 year old white male on whom I performed an anterior cervical cystectomy, fusion, and plating for cervical myelopathy. He has had persistent back, buttock, and leg pain consistent with neurogenic claudication. He has failed medical management and was worked up with a lumbar myelo CT. This demonstrated multilevel degenerative changes with spinal stenosis most prominent at L3-4 and L4-5. I discussed situation with the patient and his wife. They have decided to proceed with a lumbar laminectomy after weighing the risks, benefits, and alternatives surgery.  Preoperative diagnosis: L2-3, L3-4 and L4-5 spinal stenosis, lumbago, lumbar radiculopathy, neurogenic claudication  Postoperative diagnosis: The same  Procedure: Bilateral L2 laminotomies and L3 and L4 laminectomy to decompress the bilateral L3, L4 and L5 nerve roots using microdissection   Surgeon: Dr. Delma Officer  Asst.: Dr. Altamease Oiler  Anesthesia: Gen. endotracheal  Estimated blood loss: 150 mL  Drains: One medium Hemovac in the epidural space  Complications: None  Description of procedure: The patient was brought to the operating room by the anesthesia team. General endotracheal anesthesia was induced. The patient was turned to the prone position on the Wilson frame. The patient's lumbosacral region was then prepared with Betadine scrub and Betadine solution. Sterile drapes were applied.  I then injected the area to be incised with Marcaine with epinephrine solution. I then used a scalpel to make a linear midline incision over the L2-3, L3-4 and L4-5 intervertebral disc space. I then used electrocautery to perform a bilateral subperiosteal dissection exposing the spinous process and lamina of L2, L3 and L4. We obtained intraoperative radiograph to confirm our location. I then inserted the [employer] retractor for exposure. I incised the interspinous ligament at L2-3, L3-4 and L4-5 with a scalpel. I used a  Leksell rongeur to remove the spinous processes of L3 and L4.  We then brought the operative microscope into the field. Under its magnification and illumination we completed the microdissection. I used a high-speed drill to perform a laminotomy at L2, L3 and L4 bilaterally. I then used a Kerrison punches to widen the laminotomy and removed the ligamentum flavum at L2-3, L3-4 and L4-5 bilaterally. We then used microdissection to free up the thecal sac and the bilateral L3, L4 and L5 nerve root from the epidural tissue. I then used a Kerrison punch to perform a foraminotomy at about the bilateral L3, L4 and L5 nerve root. We inspected the intervertebral disc at L2-3, L3-4 and L4-5 bilaterally. There were no significant herniations.  I then palpated along the ventral surface of the thecal sac and along exit route of the bilateral L3, L4 and L5 nerve root and noted that the neural structures were well decompressed. This completed the decompression.  We then obtained hemostasis using bipolar electrocautery. We irrigated the wound out with bacitracin solution. We then removed the retractor. I placed a medium Hemovac drain in the epidural space and tunneled it out through a separate stab wound. We then reapproximated the patient's thoracolumbar fascia with interrupted #1 Vicryl suture. We then reapproximated the patient's subcutaneous tissue with interrupted 2-0 Vicryl suture. We then reapproximated patient's skin with Steri-Strips and benzoin. The was then coated with bacitracin ointment. The drapes were removed. The patient was subsequently returned to the supine position where they were extubated by the anesthesia team. The patient was then transported to the postanesthesia care unit in stable condition. All sponge instrument and needle counts were reportedly correct at the end of this case.

## 2015-02-02 NOTE — Anesthesia Procedure Notes (Signed)
Procedure Name: Intubation Date/Time: 02/02/2015 12:33 PM Performed by: Little Ishikawa L Pre-anesthesia Checklist: Patient identified, Emergency Drugs available, Suction available, Patient being monitored and Timeout performed Patient Re-evaluated:Patient Re-evaluated prior to inductionOxygen Delivery Method: Circle system utilized Preoxygenation: Pre-oxygenation with 100% oxygen Intubation Type: IV induction Ventilation: Mask ventilation without difficulty Laryngoscope Size: Glidescope and 4 Grade View: Grade I Tube type: Oral Tube size: 7.5 mm Number of attempts: 2 Airway Equipment and Method: Stylet Placement Confirmation: ETT inserted through vocal cords under direct vision,  positive ETCO2 and breath sounds checked- equal and bilateral Secured at: 23 cm Tube secured with: Tape Dental Injury: Teeth and Oropharynx as per pre-operative assessment  Difficulty Due To: Difficulty was anticipated and Difficult Airway- due to reduced neck mobility

## 2015-02-03 ENCOUNTER — Encounter (HOSPITAL_COMMUNITY): Payer: Self-pay | Admitting: Neurosurgery

## 2015-02-03 LAB — GLUCOSE, CAPILLARY
GLUCOSE-CAPILLARY: 100 mg/dL — AB (ref 65–99)
GLUCOSE-CAPILLARY: 127 mg/dL — AB (ref 65–99)
GLUCOSE-CAPILLARY: 137 mg/dL — AB (ref 65–99)
GLUCOSE-CAPILLARY: 202 mg/dL — AB (ref 65–99)
Glucose-Capillary: 212 mg/dL — ABNORMAL HIGH (ref 65–99)
Glucose-Capillary: 166 mg/dL — ABNORMAL HIGH (ref 65–99)

## 2015-02-03 NOTE — Care Management Note (Signed)
Case Management Note  Patient Details  Name: SRIKAR CONSOLO MRN: [id number] Date of Birth: 09-11-1940  Subjective/Objective:                    Action/Plan: Patient admitted for L2-4 laminectomy with L3-5 Microdiscectomy. Patient lives at home with his wife. Awaiting PT/OT recommendations for discharge disposition. CM will continue to follow for discharge needs.   Expected Discharge Date:   (Pending)               Expected Discharge Plan:  Home/Self Care  In-House Referral:     Discharge planning Services     Post Acute Care Choice:    Choice offered to:     DME Arranged:    DME Agency:     HH Arranged:    HH Agency:     Status of Service:  In process, will continue to follow  Medicare Important Message Given:    Date Medicare IM Given:    Medicare IM give by:    Date Additional Medicare IM Given:    Additional Medicare Important Message give by:     If discussed at Long Length of Stay Meetings, dates discussed:    Additional Comments:  Kermit Balo, RN 02/03/2015, 10:54 AM

## 2015-02-03 NOTE — Evaluation (Signed)
Physical Therapy Evaluation Patient Details Name: [name] MRN: [id number] DOB: [birth year] Today's Date: 02/03/2015   History of Present Illness  Adm for L2-5 laminectomy with decompression. PMHx-ACDF, CM, cardiomyopathy 20%, falls, RLE regional pain syndrome     Clinical Impression  Patient is s/p above surgery resulting in the deficits listed below (see PT Problem List). Pt with kyphotic posture PTA and has difficulty maintaining "no bending." Patient will benefit from skilled PT to increase their independence and safety with mobility (while adhering to their precautions) to allow discharge to the venue listed below.     Follow Up Recommendations Home health PT;Supervision for mobility/OOB    Equipment Recommendations  None recommended by PT    Recommendations for Other Services OT consult     Precautions / Restrictions Precautions Precautions: Back;Fall Precaution Booklet Issued: No Precaution Comments: educated in back precautions; pt very kyphotic PTA and could not maintain straight/upright position Restrictions Weight Bearing Restrictions: No      Mobility  Bed Mobility Overal bed mobility: Needs Assistance Bed Mobility: Supine to Sit     Supine to sit: HOB elevated;Min guard     General bed mobility comments: used HOB elevated as pt uses hospital bed at home; he was able to pivot legs off EOB and use rail to right himself  Transfers Overall transfer level: Needs assistance Equipment used: Rolling walker (2 wheeled) Transfers: Sit to/from Stand Sit to Stand: Min assist         General transfer comment: vc for safe use of DME; assist to steady  Ambulation/Gait Ambulation/Gait assistance: Min guard Ambulation Distance (Feet): 50 Feet Assistive device: Rolling walker (2 wheeled) Gait Pattern/deviations: Step-through pattern;Decreased stride length;Trunk flexed Gait velocity: slow   General Gait Details: attempted to facillitate upright posture  and pt reports hurts too much (once seated in chair with back support, he was able to use UEs on armrests to straighten his torso)  Stairs            Wheelchair Mobility    Modified Rankin (Stroke Patients Only)       Balance                                             Pertinent Vitals/Pain Pain Assessment: 0-10 Pain Score: 2  Pain Location: back Pain Descriptors / Indicators: Operative site guarding Pain Intervention(s): Limited activity within patient's tolerance;Monitored during session;Premedicated before session;Repositioned    Home Living Family/patient expects to be discharged to:: Private residence Living Arrangements: Spouse/significant other Available Help at Discharge: Family Type of Home: House Home Access: Stairs to enter Entrance Stairs-Rails: Right   Home Layout: Multi-level (older home; multiple additions with 1 step up/down throughou) Home Equipment: Walker - 4 wheels;Bedside commode;Wheelchair - manual;Hospital bed;Hand held Administrator, [employer] - 2 wheels      Prior Function Level of Independence: Independent with assistive device(s)         Comments: pt states he uses the cane in the house, uses walker outside, sleeps in a hospital bed and has assist at times for getting OOB and donning socks     Hand Dominance        Extremity/Trunk Assessment   Upper Extremity Assessment: Generalized weakness           Lower Extremity Assessment: Generalized weakness      Cervical / Trunk Assessment: Kyphotic  Communication  Communication: HOH  Cognition Arousal/Alertness: Awake/alert Behavior During Therapy: WFL for tasks assessed/performed Overall Cognitive Status: Within Functional Limits for tasks assessed                      General Comments General comments (skin integrity, edema, etc.): wife present for education    Exercises        Assessment/Plan    PT Assessment Patient needs  continued PT services  PT Diagnosis Acute pain;Difficulty walking   PT Problem List Decreased strength;Decreased activity tolerance;Decreased balance;Decreased knowledge of use of DME;Decreased mobility;Decreased knowledge of precautions;Impaired sensation;Pain  PT Treatment Interventions Stair training;Gait training;DME instruction;Functional mobility training;Therapeutic activities;Patient/family education   PT Goals (Current goals can be found in the Care Plan section) Acute Rehab PT Goals Patient Stated Goal: return home PT Goal Formulation: With patient Time For Goal Achievement: 02/10/15 Potential to Achieve Goals: Good    Frequency Min 5X/week   Barriers to discharge        Co-evaluation               End of Session   Activity Tolerance: Patient tolerated treatment well Patient left: in chair;with call bell/phone within reach;with family/visitor present Nurse Communication: Mobility status         Time: 6438-3779 PT Time Calculation (min) (ACUTE ONLY): 34 min   Charges:   PT Evaluation $Initial PT Evaluation Tier I: 1 Procedure PT Treatments $Gait Training: 8-22 mins   PT G Codes:        Jazlynn Nemetz Feb 14, 2015, 11:10 AM  Pager [phone]

## 2015-02-03 NOTE — Anesthesia Postprocedure Evaluation (Signed)
Anesthesia Post Note  Patient: [name]  Procedure(s) Performed: Procedure(s) (LRB): LUMBAR LAMINECTOMY/DECOMPRESSION MICRODISCECTOMY 2 LEVELS (N/A)  Anesthesia type: General  Patient location: PACU  Post pain: Pain level controlled  Post assessment: Post-op Vital signs reviewed  Last Vitals: BP 119/64 mmHg  Pulse 72  Temp(Src) 37.3 C (Oral)  Resp 18  Ht 5\' 9"  (1.753 m)  Wt 184 lb 8.4 oz (83.7 kg)  BMI 27.24 kg/m2  SpO2 99%  Post vital signs: Reviewed  Level of consciousness: sedated  Complications: No apparent anesthesia complications

## 2015-02-03 NOTE — Progress Notes (Signed)
Patient ID: [name], male   DOB: 1940/06/11, 74 y.o.   MRN: [id number] Subjective:  The patient is alert and pleasant. He is in no apparent distress.  Objective: Vital signs in last 24 hours: Temp:  [97.1 F (36.2 C)-99.2 F (37.3 C)] 99.2 F (37.3 C) (11/01 0603) Pulse Rate:  [60-81] 72 (11/01 0603) Resp:  [11-22] 18 (11/01 0603) BP: (115-164)/(51-84) 119/64 mmHg (11/01 0603) SpO2:  [98 %-100 %] 99 % (11/01 0603) Arterial Line BP: (146-170)/(49-58) 158/51 mmHg (10/31 1656) Weight:  [83.7 kg (184 lb 8.4 oz)] 83.7 kg (184 lb 8.4 oz) (10/31 0959)  Intake/Output from previous day: 10/31 0701 - 11/01 0700 In: 1170 [P.O.:130; I.V.:1040] Out: 100 [Blood:100] Intake/Output this shift:    Physical exam the patient is alert and pleasant. He is moving his lower extremities well.  Lab Results: No results for input(s): WBC, HGB, HCT, PLT in the last 72 hours. BMET No results for input(s): NA, K, CL, CO2, GLUCOSE, BUN, CREATININE, CALCIUM in the last 72 hours.  Studies/Results: Dg Lumbar Spine 2-3 Views  02/02/2015  CLINICAL DATA:  Two level lumbar laminectomy and decompression with microdiskectomy. EXAM: LUMBAR SPINE - 2-3 VIEW COMPARISON:  CT myelogram dated 10/14/2014. FINDINGS: The patient was previously shown to have 5 non-rib-bearing lumbar vertebrae with the last open disc space at the L5-S1 level. Metallic localizer with its tip projected posterior to the facets at the L5 level. Again demonstrated is disc space narrowing and vacuum phenomena at multiple levels. Facet degenerative changes are also noted throughout the mid and lower lumbar spine. IMPRESSION: Localizer at the mid L5 level. Electronically Signed   By: [name] M.D.   On: 02/02/2015 15:28    Assessment/Plan: Postop day #1: The patient is doing well. We will mobilize him today with PT. He may go home tomorrow.  LOS: 1 day     [name] D 02/03/2015, 7:50 AM

## 2015-02-04 LAB — GLUCOSE, CAPILLARY
GLUCOSE-CAPILLARY: 106 mg/dL — AB (ref 65–99)
GLUCOSE-CAPILLARY: 161 mg/dL — AB (ref 65–99)
Glucose-Capillary: 120 mg/dL — ABNORMAL HIGH (ref 65–99)
Glucose-Capillary: 185 mg/dL — ABNORMAL HIGH (ref 65–99)
Glucose-Capillary: 306 mg/dL — ABNORMAL HIGH (ref 65–99)
Glucose-Capillary: 192 mg/dL — ABNORMAL HIGH (ref 65–99)

## 2015-02-04 MED ORDER — MORPHINE SULFATE (PF) 2 MG/ML IV SOLN: 1.0000 mg | INTRAVENOUS | Status: DC | PRN

## 2015-02-04 MED ORDER — GABAPENTIN 100 MG PO CAPS
100.0000 mg | ORAL_CAPSULE | Freq: Three times a day (TID) | ORAL | Status: DC
  Administered 2015-02-04 – 2015-02-05 (×2): 100 mg via ORAL
  Filled 2015-02-04 (×2): qty 1

## 2015-02-04 MED ORDER — SENNA 8.6 MG PO TABS
1.0000 | ORAL_TABLET | Freq: Two times a day (BID) | ORAL | Status: DC
  Administered 2015-02-04 – 2015-02-05 (×2): 8.6 mg via ORAL
  Filled 2015-02-04 (×2): qty 1

## 2015-02-04 NOTE — Progress Notes (Signed)
Physical Therapy Treatment Patient Details Name: [name] MRN: [id number] DOB: Apr 30, 1940 Today's Date: 02/04/2015    History of Present Illness Adm for L2-5 laminectomy with decompression. PMHx-ACDF, CM, cardiomyopathy 20%, falls, RLE regional pain syndrome     PT Comments    Patient progressing with ambulation in the hall and negotiating steps.  Still with decreased safety and at risk for falls, but improving endurance and strength.  Will need HHPT and at least supervision for mobility at home.  Follow Up Recommendations  Home health PT;Supervision for mobility/OOB     Equipment Recommendations  None recommended by PT    Recommendations for Other Services       Precautions / Restrictions Precautions Precautions: Back;Fall Precaution Booklet Issued: Yes (comment) Precaution Comments: reviewed precautions with pt and wife Restrictions Weight Bearing Restrictions: No    Mobility  Bed Mobility Overal bed mobility: Needs Assistance Bed Mobility: Supine to Sit     Supine to sit: HOB elevated;Supervision     General bed mobility comments: use of railing and elevated HOB as pt sleeps in hospital bed; sit to supine with HOB flat, but pt c/o pain till [name] raised.  Transfers   Equipment used: Rolling walker (2 wheeled) Transfers: Sit to/from Stand Sit to Stand: [facility] transfer comment: cues for hand placement, initially pulling up on walker and asked me to steady it; S up from elevated chair with arms  Ambulation/Gait Ambulation/Gait assistance: Min guard;Supervision Ambulation Distance (Feet): 120 Feet (x 2) Assistive device: Rolling walker (2 wheeled) Gait Pattern/deviations: Step-through pattern;Trunk flexed;Wide base of support     General Gait Details: cues for proximity to walker and to attempt to straighten up as much as possible.   Stairs   Stairs assistance: Supervision Stair Management: Two rails Number of Stairs:  2 General stair comments: attempted to have pt use cane and rail as Jason his method at home for entry, but refused due to feeling weaker than normal.  Wheelchair Mobility    Modified Rankin (Stroke Patients Only)       Balance Overall balance assessment: Needs assistance         Standing balance support: Bilateral upper extremity supported Standing balance-Leahy Scale: Poor Standing balance comment: UE support necessary for balance                    Cognition Arousal/Alertness: Awake/alert Behavior During Therapy: WFL for tasks assessed/performed Overall Cognitive Status: Within Functional Limits for tasks assessed                      Exercises      General Comments        Pertinent Vitals/Pain Pain Score: 7  Pain Location: back and LE's Pain Descriptors / Indicators: Aching Pain Intervention(s): Monitored during session;Repositioned    Home Living                      Prior Function            PT Goals (current goals can now be found in the care plan section) Progress towards PT goals: Progressing toward goals    Frequency  Min 5X/week    PT Plan Current plan remains appropriate    Co-evaluation             End of Session Equipment Utilized During Treatment: Gait belt Activity Tolerance: Patient tolerated treatment well Patient left: with  call bell/phone within reach;in bed     Time: 1145-1210 PT Time Calculation (min) (ACUTE ONLY): 25 min  Charges:  $Gait Training: 8-22 mins $Therapeutic Activity: 8-22 mins                    G Codes:      WYNN,CYNDI Mar 02, 2015, 3:19 PM  Sheran Lawless, PT [phone] 03/02/15

## 2015-02-04 NOTE — Progress Notes (Signed)
Patient ID: [name], male   DOB: [birth year], 74 y.o.   MRN: [id number] Subjective:  The patient is somnolent but easily arousable. His back is appropriately sore. He is pleasant.  Objective: Vital signs in last 24 hours: Temp:  [98.6 F (37 C)-100.8 F (38.2 C)] 98.6 F (37 C) (11/02 1433) Pulse Rate:  [69-78] 74 (11/02 1433) Resp:  [20] 20 (11/02 1433) BP: (120-136)/(42-60) 133/60 mmHg (11/02 1433) SpO2:  [96 %-100 %] 100 % (11/02 1433)  Intake/Output from previous day:   Intake/Output this shift:    Physical exam the patient is somnolent but arousable. He is moving his lower extremities well.  Lab Results: No results for input(s): WBC, HGB, HCT, PLT in the last 72 hours. BMET No results for input(s): NA, K, CL, CO2, GLUCOSE, BUN, CREATININE, CALCIUM in the last 72 hours.  Studies/Results: No results found.  Assessment/Plan: The patient is doing well. He may be a bit oversedated. I will decrease his medications. He may go home tomorrow. I spoke with his wife.  LOS: 2 days     Dawson Albers D 02/04/2015, 6:23 PM

## 2015-02-05 LAB — GLUCOSE, CAPILLARY: GLUCOSE-CAPILLARY: 153 mg/dL — AB (ref 65–99)

## 2015-02-05 MED ORDER — OXYCODONE-ACETAMINOPHEN 5-325 MG PO TABS: 1.0000 | ORAL_TABLET | ORAL | Status: DC | PRN

## 2015-02-05 MED ORDER — CYCLOBENZAPRINE HCL 5 MG PO TABS: 5.0000 mg | ORAL_TABLET | Freq: Three times a day (TID) | ORAL | Status: DC | PRN

## 2015-02-05 NOTE — Progress Notes (Signed)
Pt discharge education and instructions completed with pt and spouse at bedside; both voices understanding and denies any questions. Pt IV and telemetry removed; pt handed his prescription for percocet; pt incision dsg changed; no active drainage, bleeding or discharge noted; dsg clean, dry and intact; pt given his pain med upon request prior to discharge; pt transported off unit via wheelchair with spouse and belongings to the side. Arabella Merles Errin Chewning RN.

## 2015-02-16 ENCOUNTER — Other Ambulatory Visit (HOSPITAL_COMMUNITY): Payer: Self-pay | Admitting: Internal Medicine

## 2015-02-19 ENCOUNTER — Other Ambulatory Visit (HOSPITAL_COMMUNITY): Payer: Self-pay | Admitting: Internal Medicine

## 2015-02-20 ENCOUNTER — Telehealth: Payer: Self-pay | Admitting: Cardiology

## 2015-02-20 NOTE — Telephone Encounter (Signed)
Spoke w/ pt wife and explained the merlin recall to her and that we will be keeping a eye on the home monitoring web site to make sure pt home monitor is updating weekly. Pt wife said that her and the pt have been sick for the last several weeks but once they feel better they will hook monitor up and send remote transmission. Pt wife knows that if pt vibratory alert goes off to call the office.

## 2015-03-03 ENCOUNTER — Inpatient Hospital Stay (HOSPITAL_COMMUNITY)
Admission: AD | Admit: 2015-03-03 | Discharge: 2015-03-09 | DRG: 857 | Disposition: A | Discharge status: Home Health | Payer: Medicare Other | Source: Ambulatory Visit | Attending: Neurosurgery | Admitting: Neurosurgery

## 2015-03-03 ENCOUNTER — Inpatient Hospital Stay (HOSPITAL_COMMUNITY): Payer: Medicare Other

## 2015-03-03 DIAGNOSIS — E785 Hyperlipidemia, unspecified: Secondary | ICD-10-CM | POA: Diagnosis present

## 2015-03-03 DIAGNOSIS — I5022 Chronic systolic (congestive) heart failure: Secondary | ICD-10-CM | POA: Diagnosis not present

## 2015-03-03 DIAGNOSIS — T8130XA Disruption of wound, unspecified, initial encounter: Secondary | ICD-10-CM | POA: Diagnosis present

## 2015-03-03 DIAGNOSIS — Y838 Other surgical procedures as the cause of abnormal reaction of the patient, or of later complication, without mention of misadventure at the time of the procedure: Secondary | ICD-10-CM | POA: Diagnosis not present

## 2015-03-03 DIAGNOSIS — M4806 Spinal stenosis, lumbar region: Secondary | ICD-10-CM | POA: Diagnosis not present

## 2015-03-03 DIAGNOSIS — N183 Chronic kidney disease, stage 3 (moderate): Secondary | ICD-10-CM | POA: Diagnosis present

## 2015-03-03 DIAGNOSIS — Z9841 Cataract extraction status, right eye: Secondary | ICD-10-CM | POA: Diagnosis not present

## 2015-03-03 DIAGNOSIS — B964 Proteus (mirabilis) (morganii) as the cause of diseases classified elsewhere: Secondary | ICD-10-CM | POA: Diagnosis not present

## 2015-03-03 DIAGNOSIS — Z89422 Acquired absence of other left toe(s): Secondary | ICD-10-CM

## 2015-03-03 DIAGNOSIS — Z23 Encounter for immunization: Secondary | ICD-10-CM

## 2015-03-03 DIAGNOSIS — M549 Dorsalgia, unspecified: Secondary | ICD-10-CM | POA: Diagnosis present

## 2015-03-03 DIAGNOSIS — G8250 Quadriplegia, unspecified: Secondary | ICD-10-CM | POA: Diagnosis not present

## 2015-03-03 DIAGNOSIS — I2510 Atherosclerotic heart disease of native coronary artery without angina pectoris: Secondary | ICD-10-CM | POA: Diagnosis present

## 2015-03-03 DIAGNOSIS — T814XXA Infection following a procedure, initial encounter: Principal | ICD-10-CM | POA: Diagnosis present

## 2015-03-03 DIAGNOSIS — B9689 Other specified bacterial agents as the cause of diseases classified elsewhere: Secondary | ICD-10-CM | POA: Diagnosis present

## 2015-03-03 DIAGNOSIS — E1141 Type 2 diabetes mellitus with diabetic mononeuropathy: Secondary | ICD-10-CM | POA: Diagnosis present

## 2015-03-03 DIAGNOSIS — Z833 Family history of diabetes mellitus: Secondary | ICD-10-CM

## 2015-03-03 DIAGNOSIS — Z79899 Other long term (current) drug therapy: Secondary | ICD-10-CM | POA: Diagnosis not present

## 2015-03-03 DIAGNOSIS — M479 Spondylosis, unspecified: Secondary | ICD-10-CM | POA: Diagnosis not present

## 2015-03-03 DIAGNOSIS — Z961 Presence of intraocular lens: Secondary | ICD-10-CM | POA: Diagnosis present

## 2015-03-03 DIAGNOSIS — I429 Cardiomyopathy, unspecified: Secondary | ICD-10-CM | POA: Diagnosis not present

## 2015-03-03 DIAGNOSIS — Z96641 Presence of right artificial hip joint: Secondary | ICD-10-CM | POA: Diagnosis not present

## 2015-03-03 DIAGNOSIS — T8132XA Disruption of internal operation (surgical) wound, not elsewhere classified, initial encounter: Secondary | ICD-10-CM | POA: Diagnosis not present

## 2015-03-03 DIAGNOSIS — M1990 Unspecified osteoarthritis, unspecified site: Secondary | ICD-10-CM | POA: Diagnosis not present

## 2015-03-03 DIAGNOSIS — Z8249 Family history of ischemic heart disease and other diseases of the circulatory system: Secondary | ICD-10-CM | POA: Diagnosis not present

## 2015-03-03 DIAGNOSIS — Z9842 Cataract extraction status, left eye: Secondary | ICD-10-CM | POA: Diagnosis not present

## 2015-03-03 DIAGNOSIS — D649 Anemia, unspecified: Secondary | ICD-10-CM | POA: Diagnosis not present

## 2015-03-03 DIAGNOSIS — Z7982 Long term (current) use of aspirin: Secondary | ICD-10-CM

## 2015-03-03 DIAGNOSIS — Z4789 Encounter for other orthopedic aftercare: Secondary | ICD-10-CM | POA: Diagnosis not present

## 2015-03-03 DIAGNOSIS — G9763 Postprocedural seroma of a nervous system organ or structure following a nervous system procedure: Secondary | ICD-10-CM | POA: Diagnosis not present

## 2015-03-03 DIAGNOSIS — Z452 Encounter for adjustment and management of vascular access device: Secondary | ICD-10-CM | POA: Diagnosis not present

## 2015-03-03 DIAGNOSIS — T8131XA Disruption of external operation (surgical) wound, not elsewhere classified, initial encounter: Secondary | ICD-10-CM | POA: Diagnosis not present

## 2015-03-03 DIAGNOSIS — R296 Repeated falls: Secondary | ICD-10-CM | POA: Diagnosis present

## 2015-03-03 DIAGNOSIS — Z7984 Long term (current) use of oral hypoglycemic drugs: Secondary | ICD-10-CM

## 2015-03-03 DIAGNOSIS — B952 Enterococcus as the cause of diseases classified elsewhere: Secondary | ICD-10-CM | POA: Diagnosis not present

## 2015-03-03 DIAGNOSIS — F329 Major depressive disorder, single episode, unspecified: Secondary | ICD-10-CM | POA: Diagnosis present

## 2015-03-03 DIAGNOSIS — K219 Gastro-esophageal reflux disease without esophagitis: Secondary | ICD-10-CM | POA: Diagnosis present

## 2015-03-03 DIAGNOSIS — Z981 Arthrodesis status: Secondary | ICD-10-CM | POA: Diagnosis not present

## 2015-03-03 DIAGNOSIS — I130 Hypertensive heart and chronic kidney disease with heart failure and stage 1 through stage 4 chronic kidney disease, or unspecified chronic kidney disease: Secondary | ICD-10-CM | POA: Diagnosis not present

## 2015-03-03 DIAGNOSIS — Z95810 Presence of automatic (implantable) cardiac defibrillator: Secondary | ICD-10-CM

## 2015-03-03 DIAGNOSIS — I252 Old myocardial infarction: Secondary | ICD-10-CM

## 2015-03-03 DIAGNOSIS — E1122 Type 2 diabetes mellitus with diabetic chronic kidney disease: Secondary | ICD-10-CM | POA: Diagnosis present

## 2015-03-03 DIAGNOSIS — M545 Low back pain: Secondary | ICD-10-CM | POA: Diagnosis not present

## 2015-03-03 DIAGNOSIS — Z792 Long term (current) use of antibiotics: Secondary | ICD-10-CM | POA: Diagnosis not present

## 2015-03-03 LAB — CBC WITH DIFFERENTIAL/PLATELET
BASOS ABS: 0 10*3/uL (ref 0.0–0.1)
Basophils Relative: 0 %
EOS PCT: 4 %
Eosinophils Absolute: 0.4 10*3/uL (ref 0.0–0.7)
HEMATOCRIT: 32.6 % — AB (ref 39.0–52.0)
Hemoglobin: 10.4 g/dL — ABNORMAL LOW (ref 13.0–17.0)
LYMPHS PCT: 20 %
Lymphs Abs: 2.2 10*3/uL (ref 0.7–4.0)
MCH: 29.8 pg (ref 26.0–34.0)
MCHC: 31.9 g/dL (ref 30.0–36.0)
MCV: 93.4 fL (ref 78.0–100.0)
MONO ABS: 0.9 10*3/uL (ref 0.1–1.0)
MONOS PCT: 8 %
NEUTROS ABS: 7.7 10*3/uL (ref 1.7–7.7)
Neutrophils Relative %: 68 %
PLATELETS: 323 10*3/uL (ref 150–400)
RBC: 3.49 MIL/uL — ABNORMAL LOW (ref 4.22–5.81)
RDW: 13.5 % (ref 11.5–15.5)
WBC: 11.2 10*3/uL — ABNORMAL HIGH (ref 4.0–10.5)

## 2015-03-03 LAB — COMPREHENSIVE METABOLIC PANEL
ALBUMIN: 3.3 g/dL — AB (ref 3.5–5.0)
ALK PHOS: 54 U/L (ref 38–126)
ALT: 11 U/L — ABNORMAL LOW (ref 17–63)
AST: 17 U/L (ref 15–41)
Anion gap: 12 (ref 5–15)
BILIRUBIN TOTAL: 0.2 mg/dL — AB (ref 0.3–1.2)
BUN: 24 mg/dL — AB (ref 6–20)
CALCIUM: 10.4 mg/dL — AB (ref 8.9–10.3)
CO2: 30 mmol/L (ref 22–32)
Chloride: 99 mmol/L — ABNORMAL LOW (ref 101–111)
Creatinine, Ser: 1.75 mg/dL — ABNORMAL HIGH (ref 0.61–1.24)
GFR calc Af Amer: 42 mL/min — ABNORMAL LOW (ref >60)
GFR calc non Af Amer: 37 mL/min — ABNORMAL LOW (ref >60)
GLUCOSE: 100 mg/dL — AB (ref 65–99)
Potassium: 4.4 mmol/L (ref 3.5–5.1)
SODIUM: 141 mmol/L (ref 135–145)
TOTAL PROTEIN: 7.7 g/dL (ref 6.5–8.1)

## 2015-03-03 LAB — GLUCOSE, CAPILLARY
GLUCOSE-CAPILLARY: 120 mg/dL — AB (ref 65–99)
GLUCOSE-CAPILLARY: 121 mg/dL — AB (ref 65–99)

## 2015-03-03 LAB — SEDIMENTATION RATE: Sed Rate: 94 mm/hr — ABNORMAL HIGH (ref 0–16)

## 2015-03-03 MED ORDER — ACETAMINOPHEN 650 MG RE SUPP: 650.0000 mg | Freq: Four times a day (QID) | RECTAL | Status: DC | PRN

## 2015-03-03 MED ORDER — POTASSIUM CHLORIDE IN NACL 20-0.45 MEQ/L-% IV SOLN
INTRAVENOUS | Status: DC
  Administered 2015-03-03: 75 mL/h via INTRAVENOUS
  Administered 2015-03-04: 11:00:00 via INTRAVENOUS
  Filled 2015-03-03 (×3): qty 1000

## 2015-03-03 MED ORDER — PNEUMOCOCCAL VAC POLYVALENT 25 MCG/0.5ML IJ INJ
0.5000 mL | INJECTION | INTRAMUSCULAR | Status: AC
  Administered 2015-03-04: 0.5 mL via INTRAMUSCULAR
  Filled 2015-03-03: qty 0.5

## 2015-03-03 MED ORDER — MORPHINE SULFATE (PF) 2 MG/ML IV SOLN
2.0000 mg | INTRAVENOUS | Status: DC | PRN
  Administered 2015-03-04 – 2015-03-09 (×6): 2 mg via INTRAVENOUS
  Filled 2015-03-03 (×6): qty 1

## 2015-03-03 MED ORDER — ENSURE ENLIVE PO LIQD
237.0000 mL | Freq: Two times a day (BID) | ORAL | Status: DC
  Administered 2015-03-04 – 2015-03-09 (×10): 237 mL via ORAL
  Filled 2015-03-03 (×14): qty 237

## 2015-03-03 MED ORDER — ONDANSETRON HCL 4 MG PO TABS
4.0000 mg | ORAL_TABLET | Freq: Four times a day (QID) | ORAL | Status: DC | PRN
Start: 2015-03-03 — End: 2015-03-09

## 2015-03-03 MED ORDER — INSULIN ASPART 100 UNIT/ML ~~LOC~~ SOLN
0.0000 [IU] | SUBCUTANEOUS | Status: DC
  Administered 2015-03-04: 4 [IU] via SUBCUTANEOUS
  Administered 2015-03-04 – 2015-03-05 (×3): 3 [IU] via SUBCUTANEOUS
  Administered 2015-03-05: 4 [IU] via SUBCUTANEOUS
  Administered 2015-03-06: 3 [IU] via SUBCUTANEOUS
  Administered 2015-03-06: 7 [IU] via SUBCUTANEOUS
  Administered 2015-03-06: 3 [IU] via SUBCUTANEOUS
  Administered 2015-03-07: 4 [IU] via SUBCUTANEOUS

## 2015-03-03 MED ORDER — OXYCODONE HCL 5 MG PO TABS
5.0000 mg | ORAL_TABLET | ORAL | Status: DC | PRN
  Administered 2015-03-04 – 2015-03-09 (×12): 5 mg via ORAL
  Filled 2015-03-03 (×12): qty 1

## 2015-03-03 MED ORDER — VANCOMYCIN HCL IN DEXTROSE 1-5 GM/200ML-% IV SOLN
1000.0000 mg | INTRAVENOUS | Status: DC
  Administered 2015-03-04 – 2015-03-07 (×4): 1000 mg via INTRAVENOUS
  Filled 2015-03-03 (×5): qty 200

## 2015-03-03 MED ORDER — BISACODYL 10 MG RE SUPP: 10.0000 mg | Freq: Every day | RECTAL | Status: DC | PRN

## 2015-03-03 MED ORDER — ACETAMINOPHEN 325 MG PO TABS
650.0000 mg | ORAL_TABLET | Freq: Four times a day (QID) | ORAL | Status: DC | PRN
  Administered 2015-03-05: 650 mg via ORAL
  Filled 2015-03-03: qty 2

## 2015-03-03 MED ORDER — ONDANSETRON HCL 4 MG/2ML IJ SOLN: 4.0000 mg | Freq: Four times a day (QID) | INTRAMUSCULAR | Status: DC | PRN

## 2015-03-03 MED ORDER — VANCOMYCIN HCL 10 G IV SOLR
1500.0000 mg | Freq: Once | INTRAVENOUS | Status: AC
  Administered 2015-03-03: 1500 mg via INTRAVENOUS
  Filled 2015-03-03: qty 1500

## 2015-03-03 MED ORDER — DOCUSATE SODIUM 100 MG PO CAPS
100.0000 mg | ORAL_CAPSULE | Freq: Two times a day (BID) | ORAL | Status: DC
  Administered 2015-03-03 – 2015-03-09 (×12): 100 mg via ORAL
  Filled 2015-03-03 (×13): qty 1

## 2015-03-03 MED ORDER — DEXTROSE 5 % IV SOLN
2.0000 g | INTRAVENOUS | Status: DC
  Administered 2015-03-03 – 2015-03-05 (×3): 2 g via INTRAVENOUS
  Filled 2015-03-03 (×4): qty 2

## 2015-03-03 NOTE — Progress Notes (Signed)
ANTIBIOTIC CONSULT NOTE - INITIAL  Pharmacy Consult for Ceftriaxone and Vancomycin Indication: wound drainage/dehiscence  Patient Measurements: Wt: 83.7 kg  Labs:  Recent Labs  03/03/15 1933  WBC 11.2*  HGB 10.4*  PLT 323  CREATININE 1.75*    Microbiology: Cx data:  11/29 blood : px   Abx:  CTX: 11/29 >>  VANC: 11/29 >>  Assessment: 74yoM admitted with wound drainage,dehiscence, and erythema s/p L2-3, L3 L4 and L4 L5 laminectomy for spinal stenosis on 02/02/15. CT scan with no evidence of osteo or fluid collection.  Goal of Therapy:  Vancomycin trough level 15-20 mcg/ml  Plan:  1. Ceftriaxone 2 grams IV Q24H 2. Vancomycin 1500 mg LD; vancomycin 1000 mg Q24H starting 11/30 at 22:00 3. Await cx data and narrow abx as feasible  Thank you for the opportunity to participate in this patients care!  Pollyann Samples, PharmD, BCPS 03/03/2015, 9:19 PM Pager: [phone]

## 2015-03-03 NOTE — H&P (Signed)
Subjective: [name] is a 74 year old white male with multiple medical problems on whom I performed an L2-3, L3 L4 and L4 L5 laminectomy for spinal stenosis on 02/02/15.  The patient was subsequently discharged to home.  According to the patient's wife shortly after his discharge home he developed some wound drainage.  The patient's wife contacted me several weeks later and we tried several times to bring him into the office.  The patient presents to the office today with wound drainage,dehiscence, and erythema. I recommended that the patient be admitted for further workup and management.  Past Medical History  Diagnosis Date  . Hypertension   . Hyperlipidemia   . Cervical spinal stenosis   . Lumbar spinal stenosis   . 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 DVT (deep venous thrombosis) (HCC) 10/2011    RUE; pt denies this hx on 05-24-13  . Anemia   . Cardiomyopathy (HCC)     a. 10/2012 Echo: EF 20-25%, diff HK, mod dil LA/RV/RA.  Marland Kitchen CAD (coronary artery disease)   . Atrial flutter (HCC)   . CKD (chronic kidney disease), stage III   . Venous insufficiency   . Frequent falls   . 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 Chronic systolic congestive heart failure (HCC)   . Quadriparesis (HCC) Aug 11, 2011    pre op  . Hypoglycemia   . Cervical myelopathy (HCC)   . Acute encephalopathy   . Mononeuritis of unspecified site   . Dysrhythmia   . H/O hiatal hernia   . Arthritis     "all over" (2013/05/24)  . Depression     "son died in service in 08/11/90; still bothers me" (2013-05-24)  . AICD (automatic cardioverter/defibrillator) present   . Shortness of breath dyspnea     with exertion  . Pneumonia 1960    , also 2014  . Type II diabetes mellitus (HCC)     type 2  . GERD (gastroesophageal reflux disease)   . Myocardial infarction [facility])     Past Surgical History  Procedure Laterality Date  . Total hip arthroplasty Right ~ 11-Aug-2010  . 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 Anterior cervical decomp/discectomy fusion  10/31/2011    Procedure: ANTERIOR CERVICAL DECOMPRESSION/DISCECTOMY FUSION 2 LEVELS;  Surgeon: Cristi Loron, MD;  Location: [facility];  Service: Neurosurgery;  Laterality: N/A;  Cervical Four-Five Cervical Five-Six Anterior Cervical Decompression with fusion interbody prothesis plating and bonegraft  . Carpal tunnel release Right ~ 08-11-2011  . Bi-ventricular implantable cardioverter defibrillator  (crt-d) Left 24-May-2013    STJ CRTD implanted by Dr Ladona Ridgel for primary prevention/CHF  . Cataract extraction w/ intraocular lens implant Bilateral   . Cardiac catheterization  10/2012  . Left and right heart catheterization with coronary angiogram N/A 11/06/2012    Procedure: LEFT AND RIGHT HEART CATHETERIZATION WITH CORONARY ANGIOGRAM;  Surgeon: Laurey Morale, MD;  Location: [facility] CATH LAB;  Service: Cardiovascular;  Laterality: N/A;  . Bi-ventricular implantable cardioverter defibrillator N/A 24-May-2013    Procedure: BI-VENTRICULAR IMPLANTABLE CARDIOVERTER DEFIBRILLATOR  (CRT-D);  Surgeon: Marinus Maw, MD;  Location: [facility] CATH LAB;  Service: Cardiovascular;  Laterality: N/A;  . Joint replacement  2011/08/11  . Anterior cervical decomp/discectomy fusion N/A 04/17/2014    Procedure: CERVICAL THREE TO FOUR ANTERIOR CERVICAL DECOMPRESSION/DISCECTOMY FUSION 1 LEVEL/HARDWARE REMOVAL;  Surgeon: Tressie Stalker, MD;  Location: [facility];  Service: Neurosurgery;  Laterality:  N/A;  C34 anterior cervical decompression with fusion interbody prosthesis plating and bonegraft with exploration of prev fusion and possible removal of old hardware  . Toe removal  Left     3rd and 4th toe removed  . Lumbar laminectomy/decompression microdiscectomy N/A 02/02/2015    Procedure: LUMBAR LAMINECTOMY/DECOMPRESSION MICRODISCECTOMY 2 LEVELS;  Surgeon: Tressie Stalker, MD;  Location: [facility];  Service: Neurosurgery;  Laterality: N/A;  L34 L45  laminectomy and foraminotomy    Allergies  Allergen Reactions  . Acyclovir And Related   . Dristan Cold [Chlorphen-Pe-Acetaminophen] Anxiety    Makes pt feel "wired up"  . Prednisone Other (See Comments)    Messes with blood sugar levels     Social History  Substance Use Topics  . Smoking status: Never Smoker   . Smokeless tobacco: Never Used  . Alcohol Use: No     Comment: 05/16/2013 "used to drink a little bit a long time ago; nothing in over 30 yrs"    Family History  Problem Relation Age of Onset  . Diabetes Brother   . Cancer Father     Lung cancer  . Heart attack Mother     pt thinks she had MI  . Cancer Mother    Prior to Admission medications   Medication Sig Start Date End Date Taking? Authorizing Provider  acarbose (PRECOSE) 50 MG tablet Take 50 mg by mouth 3 (three) times daily with meals. Take 1 tablet 1-3 times a day    Historical Provider, MD  aspirin EC 81 MG tablet Take 1 tablet (81 mg total) by mouth daily. 12/16/14   Laurey Morale, MD  carvedilol (COREG) 12.5 MG tablet Take 1 tablet (12.5 mg total) by mouth 2 (two) times daily with a meal. 04/14/14   Amy D Clegg, NP  cyclobenzaprine (FLEXERIL) 5 MG tablet Take 1 tablet (5 mg total) by mouth 3 (three) times daily as needed for muscle spasms. 02/05/15   Tressie Stalker, MD  docusate sodium (COLACE) 100 MG capsule Take 300 mg by mouth 2 (two) times daily.     Historical Provider, MD  fluticasone (FLONASE) 50 MCG/ACT nasal spray Place 2 sprays into the nose daily as needed.     Historical Provider, MD  furosemide (LASIX) 40 MG tablet Take 40 mg by mouth daily.     Historical Provider, MD  gabapentin (NEURONTIN) 300 MG capsule Take 300 mg by mouth 3 (three) times daily. 11/09/12   Christiane Ha, MD  glimepiride (AMARYL) 2 MG tablet Take 2 mg by mouth 2 (two) times daily.    Historical Provider, MD  guaiFENesin (MUCINEX) 600 MG 12 hr tablet Take 600 mg by mouth 2 (two) times daily as needed.     Historical Provider,  MD  hydrALAZINE (APRESOLINE) 25 MG tablet TAKE ONE-HALF TABLET BY MOUTH THREE TIMES DAILY 10/30/14   Dolores Patty, MD  isosorbide mononitrate (IMDUR) 30 MG 24 hr tablet TAKE ONE TABLET BY MOUTH ONCE DAILY 11/25/13   Dolores Patty, MD  isosorbide mononitrate (IMDUR) 30 MG 24 hr tablet TAKE ONE TABLET BY MOUTH ONCE DAILY. 02/16/15   Dolores Patty, MD  isosorbide mononitrate (IMDUR) 30 MG 24 hr tablet TAKE ONE TABLET BY MOUTH ONCE DAILY. 02/19/15   Dolores Patty, MD  ivabradine HCl (CORLANOR) 5 MG TABS tablet Take 1 tablet (5 mg total) by mouth 2 (two) times daily with a meal. 01/06/14   Laurey Morale, MD  magnesium hydroxide (MILK OF MAGNESIA)  400 MG/5ML suspension Take 30 mL by mouth daily as needed for constipation.    Historical Provider, MD  metFORMIN (GLUCOPHAGE) 500 MG tablet Take 500 mg by mouth 2 (two) times daily with a meal.     Historical Provider, MD  oxyCODONE-acetaminophen (PERCOCET/ROXICET) 5-325 MG tablet Take 1-2 tablets by mouth every 4 (four) hours as needed for moderate pain. 02/05/15   Tressie Stalker, MD  phenylephrine (NEO-SYNEPHRINE) 0.25 % nasal spray Place 1 spray into both nostrils every 6 (six) hours as needed for congestion.    Historical Provider, MD  pravastatin (PRAVACHOL) 40 MG tablet Take 1 tablet (40 mg total) by mouth at bedtime. 01/31/13   Sharon Seller, NP  silver sulfADIAZINE (SILVADENE) 1 % cream Apply 1 application topically. For infection of the skin 08/07/14   Historical Provider, MD     Review of Systems  Positive ROS: Chronic neck and back pain  All other systems have been reviewed and were otherwise negative with the exception of those mentioned in the HPI and as above.  Objective: Vital signs in last 24 hours:    Physical exam:  Gen.: An alert and pleasant 74 year old white male in no apparent distress who presents in a wheelchair.  HEENT: Normocephalic, atraumatic  Neck: Limited range of motion and supple.  His wound is  well-healed.  Thorax: Symmetric  Abdomen: Soft  Extremities: Unremarkable Neurologic exam: The patient is alert and oriented +3.  He is moving all 4 extremities well.  Lumbar wound: The patient's lumbar wound has a superficial dehiscence with granulation tissue.  There is significant surrounding erythema.  He has some small amounts of purulent discharge. Data Review Lab Results  Component Value Date   WBC 8.2 01/29/2015   HGB 12.2* 01/29/2015   HCT 37.2* 01/29/2015   MCV 95.4 01/29/2015   PLT 201 01/29/2015   Lab Results  Component Value Date   NA 142 01/29/2015   K 4.4 01/29/2015   CL 103 01/29/2015   CO2 25 01/29/2015   BUN 27* 01/29/2015   CREATININE 1.84* 01/29/2015   GLUCOSE 186* 01/29/2015   Lab Results  Component Value Date   INR 1.51* 10/29/2012    Assessment/Plan: Wound drainage, dehiscence, infection: I have discussed the situation with the patient and his wife.  I have recommended that we admit him for blood cultures, dressing changes, lumbar CT (he can't get an MRI because he has a pacemaker) and empiric antibiotics.  They are agreeable.   Cristi Loron 03/03/2015 5:33 PM

## 2015-03-04 ENCOUNTER — Encounter (HOSPITAL_COMMUNITY): Payer: Self-pay | Admitting: Certified Registered"

## 2015-03-04 ENCOUNTER — Encounter (HOSPITAL_COMMUNITY)
Admission: AD | Disposition: A | Discharge status: Home Health | Payer: Self-pay | Source: Ambulatory Visit | Attending: Neurosurgery

## 2015-03-04 ENCOUNTER — Inpatient Hospital Stay (HOSPITAL_COMMUNITY): Payer: Medicare Other | Admitting: Certified Registered"

## 2015-03-04 DIAGNOSIS — T8130XA Disruption of wound, unspecified, initial encounter: Secondary | ICD-10-CM | POA: Diagnosis not present

## 2015-03-04 DIAGNOSIS — I429 Cardiomyopathy, unspecified: Secondary | ICD-10-CM | POA: Diagnosis not present

## 2015-03-04 DIAGNOSIS — I5022 Chronic systolic (congestive) heart failure: Secondary | ICD-10-CM | POA: Diagnosis not present

## 2015-03-04 DIAGNOSIS — I130 Hypertensive heart and chronic kidney disease with heart failure and stage 1 through stage 4 chronic kidney disease, or unspecified chronic kidney disease: Secondary | ICD-10-CM | POA: Diagnosis not present

## 2015-03-04 DIAGNOSIS — Z96641 Presence of right artificial hip joint: Secondary | ICD-10-CM | POA: Diagnosis not present

## 2015-03-04 DIAGNOSIS — E1122 Type 2 diabetes mellitus with diabetic chronic kidney disease: Secondary | ICD-10-CM | POA: Diagnosis not present

## 2015-03-04 DIAGNOSIS — R296 Repeated falls: Secondary | ICD-10-CM | POA: Diagnosis not present

## 2015-03-04 DIAGNOSIS — Z981 Arthrodesis status: Secondary | ICD-10-CM | POA: Diagnosis not present

## 2015-03-04 DIAGNOSIS — K219 Gastro-esophageal reflux disease without esophagitis: Secondary | ICD-10-CM | POA: Diagnosis not present

## 2015-03-04 DIAGNOSIS — I252 Old myocardial infarction: Secondary | ICD-10-CM | POA: Diagnosis not present

## 2015-03-04 DIAGNOSIS — M549 Dorsalgia, unspecified: Secondary | ICD-10-CM | POA: Diagnosis not present

## 2015-03-04 DIAGNOSIS — N183 Chronic kidney disease, stage 3 (moderate): Secondary | ICD-10-CM | POA: Diagnosis not present

## 2015-03-04 DIAGNOSIS — Z7984 Long term (current) use of oral hypoglycemic drugs: Secondary | ICD-10-CM | POA: Diagnosis not present

## 2015-03-04 DIAGNOSIS — E1141 Type 2 diabetes mellitus with diabetic mononeuropathy: Secondary | ICD-10-CM | POA: Diagnosis not present

## 2015-03-04 DIAGNOSIS — Z8249 Family history of ischemic heart disease and other diseases of the circulatory system: Secondary | ICD-10-CM | POA: Diagnosis not present

## 2015-03-04 DIAGNOSIS — T814XXA Infection following a procedure, initial encounter: Secondary | ICD-10-CM | POA: Diagnosis not present

## 2015-03-04 DIAGNOSIS — Z7982 Long term (current) use of aspirin: Secondary | ICD-10-CM | POA: Diagnosis not present

## 2015-03-04 DIAGNOSIS — Z833 Family history of diabetes mellitus: Secondary | ICD-10-CM | POA: Diagnosis not present

## 2015-03-04 DIAGNOSIS — I2510 Atherosclerotic heart disease of native coronary artery without angina pectoris: Secondary | ICD-10-CM | POA: Diagnosis not present

## 2015-03-04 DIAGNOSIS — E785 Hyperlipidemia, unspecified: Secondary | ICD-10-CM | POA: Diagnosis not present

## 2015-03-04 DIAGNOSIS — Z79899 Other long term (current) drug therapy: Secondary | ICD-10-CM | POA: Diagnosis not present

## 2015-03-04 DIAGNOSIS — Z89422 Acquired absence of other left toe(s): Secondary | ICD-10-CM | POA: Diagnosis not present

## 2015-03-04 DIAGNOSIS — F329 Major depressive disorder, single episode, unspecified: Secondary | ICD-10-CM | POA: Diagnosis not present

## 2015-03-04 DIAGNOSIS — D649 Anemia, unspecified: Secondary | ICD-10-CM | POA: Diagnosis not present

## 2015-03-04 DIAGNOSIS — Z95810 Presence of automatic (implantable) cardiac defibrillator: Secondary | ICD-10-CM | POA: Diagnosis not present

## 2015-03-04 HISTORY — PX: LUMBAR WOUND DEBRIDEMENT: SHX1988

## 2015-03-04 LAB — GLUCOSE, CAPILLARY
GLUCOSE-CAPILLARY: 106 mg/dL — AB (ref 65–99)
GLUCOSE-CAPILLARY: 80 mg/dL (ref 65–99)
Glucose-Capillary: 110 mg/dL — ABNORMAL HIGH (ref 65–99)
Glucose-Capillary: 110 mg/dL — ABNORMAL HIGH (ref 65–99)
Glucose-Capillary: 165 mg/dL — ABNORMAL HIGH (ref 65–99)

## 2015-03-04 SURGERY — LUMBAR WOUND DEBRIDEMENT
Anesthesia: General | Site: Back

## 2015-03-04 MED ORDER — ACARBOSE 50 MG PO TABS
50.0000 mg | ORAL_TABLET | Freq: Three times a day (TID) | ORAL | Status: DC
  Administered 2015-03-05 – 2015-03-09 (×15): 50 mg via ORAL
  Filled 2015-03-04 (×17): qty 1

## 2015-03-04 MED ORDER — HYDRALAZINE HCL 25 MG PO TABS
25.0000 mg | ORAL_TABLET | Freq: Three times a day (TID) | ORAL | Status: DC
  Administered 2015-03-04 – 2015-03-09 (×13): 25 mg via ORAL
  Filled 2015-03-04 (×14): qty 1

## 2015-03-04 MED ORDER — OXYCODONE-ACETAMINOPHEN 5-325 MG PO TABS: 1.0000 | ORAL_TABLET | ORAL | Status: DC | PRN

## 2015-03-04 MED ORDER — DOCUSATE SODIUM 100 MG PO CAPS: 300.0000 mg | ORAL_CAPSULE | Freq: Two times a day (BID) | ORAL | Status: DC

## 2015-03-04 MED ORDER — ONDANSETRON HCL 4 MG/2ML IJ SOLN
INTRAMUSCULAR | Status: DC | PRN
  Administered 2015-03-04: 4 mg via INTRAVENOUS

## 2015-03-04 MED ORDER — VANCOMYCIN HCL 1000 MG IV SOLR
INTRAVENOUS | Status: AC
  Filled 2015-03-04: qty 1000

## 2015-03-04 MED ORDER — VANCOMYCIN HCL 1000 MG IV SOLR
INTRAVENOUS | Status: DC | PRN
  Administered 2015-03-04: 1000 mg via TOPICAL

## 2015-03-04 MED ORDER — PRAVASTATIN SODIUM 40 MG PO TABS
40.0000 mg | ORAL_TABLET | Freq: Every day | ORAL | Status: DC
  Administered 2015-03-04 – 2015-03-08 (×5): 40 mg via ORAL
  Filled 2015-03-04 (×5): qty 1

## 2015-03-04 MED ORDER — IVABRADINE HCL 5 MG PO TABS
5.0000 mg | ORAL_TABLET | Freq: Two times a day (BID) | ORAL | Status: DC
  Administered 2015-03-05 – 2015-03-09 (×10): 5 mg via ORAL
  Filled 2015-03-04 (×11): qty 1

## 2015-03-04 MED ORDER — 0.9 % SODIUM CHLORIDE (POUR BTL) OPTIME
TOPICAL | Status: DC | PRN
  Administered 2015-03-04: 1000 mL

## 2015-03-04 MED ORDER — LACTATED RINGERS IV SOLN
INTRAVENOUS | Status: DC | PRN
  Administered 2015-03-04: 18:00:00 via INTRAVENOUS

## 2015-03-04 MED ORDER — FUROSEMIDE 40 MG PO TABS
40.0000 mg | ORAL_TABLET | Freq: Every day | ORAL | Status: DC
  Administered 2015-03-05 – 2015-03-09 (×5): 40 mg via ORAL
  Filled 2015-03-04 (×5): qty 1

## 2015-03-04 MED ORDER — PROPOFOL 10 MG/ML IV BOLUS
INTRAVENOUS | Status: DC | PRN
  Administered 2015-03-04: 110 mg via INTRAVENOUS

## 2015-03-04 MED ORDER — LIDOCAINE HCL (CARDIAC) 20 MG/ML IV SOLN
INTRAVENOUS | Status: DC | PRN
  Administered 2015-03-04: 100 mg via INTRAVENOUS

## 2015-03-04 MED ORDER — METFORMIN HCL 500 MG PO TABS
500.0000 mg | ORAL_TABLET | Freq: Two times a day (BID) | ORAL | Status: DC
  Administered 2015-03-05: 500 mg via ORAL
  Filled 2015-03-04: qty 1

## 2015-03-04 MED ORDER — ONDANSETRON HCL 4 MG/2ML IJ SOLN
INTRAMUSCULAR | Status: AC
  Filled 2015-03-04: qty 2

## 2015-03-04 MED ORDER — ONDANSETRON HCL 4 MG/2ML IJ SOLN: 4.0000 mg | Freq: Once | INTRAMUSCULAR | Status: DC | PRN

## 2015-03-04 MED ORDER — ADULT MULTIVITAMIN W/MINERALS CH
1.0000 | ORAL_TABLET | Freq: Every day | ORAL | Status: DC
  Administered 2015-03-05 – 2015-03-09 (×5): 1 via ORAL
  Filled 2015-03-04 (×6): qty 1

## 2015-03-04 MED ORDER — PHENYLEPHRINE HCL 0.25 % NA SOLN
1.0000 | Freq: Four times a day (QID) | NASAL | Status: DC | PRN
  Administered 2015-03-05 – 2015-03-07 (×2): 1 via NASAL
  Filled 2015-03-04: qty 15

## 2015-03-04 MED ORDER — ISOSORBIDE MONONITRATE ER 30 MG PO TB24
30.0000 mg | ORAL_TABLET | Freq: Every day | ORAL | Status: DC
  Administered 2015-03-05 – 2015-03-09 (×5): 30 mg via ORAL
  Filled 2015-03-04 (×5): qty 1

## 2015-03-04 MED ORDER — HYDROMORPHONE HCL 1 MG/ML IJ SOLN
INTRAMUSCULAR | Status: AC
  Administered 2015-03-04: 0.5 mg via INTRAVENOUS
  Filled 2015-03-04: qty 1

## 2015-03-04 MED ORDER — LIDOCAINE HCL (CARDIAC) 20 MG/ML IV SOLN
INTRAVENOUS | Status: AC
  Filled 2015-03-04: qty 5

## 2015-03-04 MED ORDER — CARVEDILOL 12.5 MG PO TABS
12.5000 mg | ORAL_TABLET | Freq: Two times a day (BID) | ORAL | Status: DC
  Administered 2015-03-05 – 2015-03-09 (×9): 12.5 mg via ORAL
  Filled 2015-03-04 (×10): qty 1

## 2015-03-04 MED ORDER — FENTANYL CITRATE (PF) 250 MCG/5ML IJ SOLN
INTRAMUSCULAR | Status: AC
  Filled 2015-03-04: qty 5

## 2015-03-04 MED ORDER — SODIUM CHLORIDE 0.9 % IV SOLN
INTRAVENOUS | Status: DC | PRN
  Administered 2015-03-04: 18:00:00 via INTRAVENOUS

## 2015-03-04 MED ORDER — POTASSIUM CHLORIDE IN NACL 20-0.45 MEQ/L-% IV SOLN
INTRAVENOUS | Status: DC
  Administered 2015-03-04 – 2015-03-07 (×2): via INTRAVENOUS
  Filled 2015-03-04 (×10): qty 1000

## 2015-03-04 MED ORDER — GUAIFENESIN ER 600 MG PO TB12
600.0000 mg | ORAL_TABLET | Freq: Two times a day (BID) | ORAL | Status: DC
  Administered 2015-03-04 – 2015-03-09 (×10): 600 mg via ORAL
  Filled 2015-03-04 (×10): qty 1

## 2015-03-04 MED ORDER — FLUTICASONE PROPIONATE 50 MCG/ACT NA SUSP
2.0000 | Freq: Every day | NASAL | Status: DC
  Administered 2015-03-05 – 2015-03-06 (×2): 2 via NASAL
  Filled 2015-03-04 (×2): qty 16

## 2015-03-04 MED ORDER — GABAPENTIN 300 MG PO CAPS
300.0000 mg | ORAL_CAPSULE | Freq: Three times a day (TID) | ORAL | Status: DC
  Administered 2015-03-04 – 2015-03-09 (×15): 300 mg via ORAL
  Filled 2015-03-04 (×15): qty 1

## 2015-03-04 MED ORDER — BACITRACIN 50000 UNITS IM SOLR
INTRAMUSCULAR | Status: DC | PRN
  Administered 2015-03-04: 19:00:00

## 2015-03-04 MED ORDER — OXYCODONE HCL 5 MG PO TABS
ORAL_TABLET | ORAL | Status: AC
  Filled 2015-03-04: qty 1

## 2015-03-04 MED ORDER — BACITRACIN ZINC 500 UNIT/GM EX OINT
TOPICAL_OINTMENT | CUTANEOUS | Status: DC | PRN
  Administered 2015-03-04: 1 via TOPICAL

## 2015-03-04 MED ORDER — MEPERIDINE HCL 25 MG/ML IJ SOLN: 6.2500 mg | INTRAMUSCULAR | Status: DC | PRN

## 2015-03-04 MED ORDER — HYDROMORPHONE HCL 1 MG/ML IJ SOLN
INTRAMUSCULAR | Status: AC
  Filled 2015-03-04: qty 1

## 2015-03-04 MED ORDER — HYDROMORPHONE HCL 1 MG/ML IJ SOLN
0.2500 mg | INTRAMUSCULAR | Status: DC | PRN
  Administered 2015-03-04 (×3): 0.5 mg via INTRAVENOUS

## 2015-03-04 MED ORDER — ASPIRIN EC 81 MG PO TBEC
81.0000 mg | DELAYED_RELEASE_TABLET | Freq: Every day | ORAL | Status: DC
  Administered 2015-03-05 – 2015-03-09 (×5): 81 mg via ORAL
  Filled 2015-03-04 (×5): qty 1

## 2015-03-04 MED ORDER — PROPOFOL 10 MG/ML IV BOLUS
INTRAVENOUS | Status: AC
  Filled 2015-03-04: qty 20

## 2015-03-04 MED ORDER — GLIMEPIRIDE 2 MG PO TABS
2.0000 mg | ORAL_TABLET | Freq: Two times a day (BID) | ORAL | Status: DC
  Administered 2015-03-05 – 2015-03-09 (×9): 2 mg via ORAL
  Filled 2015-03-04 (×10): qty 1

## 2015-03-04 MED ORDER — SUCCINYLCHOLINE CHLORIDE 20 MG/ML IJ SOLN
INTRAMUSCULAR | Status: DC | PRN
  Administered 2015-03-04: 100 mg via INTRAVENOUS

## 2015-03-04 MED ORDER — CYCLOBENZAPRINE HCL 10 MG PO TABS
5.0000 mg | ORAL_TABLET | Freq: Three times a day (TID) | ORAL | Status: DC | PRN
  Administered 2015-03-05 – 2015-03-09 (×6): 5 mg via ORAL
  Filled 2015-03-04 (×6): qty 1

## 2015-03-04 MED ORDER — FENTANYL CITRATE (PF) 250 MCG/5ML IJ SOLN
INTRAMUSCULAR | Status: DC | PRN
  Administered 2015-03-04: 50 ug via INTRAVENOUS
  Administered 2015-03-04: 150 ug via INTRAVENOUS
  Administered 2015-03-04: 50 ug via INTRAVENOUS

## 2015-03-04 SURGICAL SUPPLY — 42 items
APL SKNCLS STERI-STRIP NONHPOA (GAUZE/BANDAGES/DRESSINGS)
BAG DECANTER FOR FLEXI CONT (MISCELLANEOUS) ×3 IMPLANT
BENZOIN TINCTURE PRP APPL 2/3 (GAUZE/BANDAGES/DRESSINGS) ×1 IMPLANT
BLADE CLIPPER SURG (BLADE) IMPLANT
CANISTER SUCT 3000ML PPV (MISCELLANEOUS) ×3 IMPLANT
CLOSURE WOUND 1/2 X4 (GAUZE/BANDAGES/DRESSINGS)
DRAPE LAPAROTOMY 100X72X124 (DRAPES) ×3 IMPLANT
DRAPE POUCH INSTRU U-SHP 10X18 (DRAPES) ×3 IMPLANT
DRAPE SURG 17X23 STRL (DRAPES) ×12 IMPLANT
ELECT REM PT RETURN 9FT ADLT (ELECTROSURGICAL) ×3
ELECTRODE REM PT RTRN 9FT ADLT (ELECTROSURGICAL) ×1 IMPLANT
GAUZE SPONGE 4X4 12PLY STRL (GAUZE/BANDAGES/DRESSINGS) ×3 IMPLANT
GAUZE SPONGE 4X4 16PLY XRAY LF (GAUZE/BANDAGES/DRESSINGS) IMPLANT
GLOVE BIO SURGEON STRL SZ8 (GLOVE) ×3 IMPLANT
GLOVE BIO SURGEON STRL SZ8.5 (GLOVE) ×3 IMPLANT
GLOVE EXAM NITRILE LRG STRL (GLOVE) IMPLANT
GLOVE EXAM NITRILE MD LF STRL (GLOVE) IMPLANT
GLOVE EXAM NITRILE XL STR (GLOVE) IMPLANT
GLOVE EXAM NITRILE XS STR PU (GLOVE) IMPLANT
GOWN STRL REUS W/ TWL LRG LVL3 (GOWN DISPOSABLE) IMPLANT
GOWN STRL REUS W/ TWL XL LVL3 (GOWN DISPOSABLE) IMPLANT
GOWN STRL REUS W/TWL LRG LVL3 (GOWN DISPOSABLE) ×3
GOWN STRL REUS W/TWL XL LVL3 (GOWN DISPOSABLE) ×3
KIT BASIN OR (CUSTOM PROCEDURE TRAY) ×3 IMPLANT
KIT ROOM TURNOVER OR (KITS) ×3 IMPLANT
NEEDLE HYPO 22GX1.5 SAFETY (NEEDLE) IMPLANT
NS IRRIG 1000ML POUR BTL (IV SOLUTION) ×3 IMPLANT
PACK LAMINECTOMY NEURO (CUSTOM PROCEDURE TRAY) ×3 IMPLANT
PAD ARMBOARD 7.5X6 YLW CONV (MISCELLANEOUS) ×9 IMPLANT
STRIP CLOSURE SKIN 1/2X4 (GAUZE/BANDAGES/DRESSINGS) ×1 IMPLANT
SUT ETHILON 2 0 PSLX (SUTURE) IMPLANT
SUT MON AB 2-0 CT1 36 (SUTURE) ×2 IMPLANT
SUT PROLENE 0 CT 1 30 (SUTURE) ×2 IMPLANT
SUT VIC AB 1 CT1 18XBRD ANBCTR (SUTURE) ×1 IMPLANT
SUT VIC AB 1 CT1 8-18 (SUTURE) ×3
SUT VIC AB 2-0 CP2 18 (SUTURE) ×3 IMPLANT
SWAB COLLECTION DEVICE MRSA (MISCELLANEOUS) ×2 IMPLANT
TAPE CLOTH SURG 4X10 WHT LF (GAUZE/BANDAGES/DRESSINGS) ×2 IMPLANT
TOWEL OR 17X24 6PK STRL BLUE (TOWEL DISPOSABLE) ×3 IMPLANT
TOWEL OR 17X26 10 PK STRL BLUE (TOWEL DISPOSABLE) ×3 IMPLANT
TUBE ANAEROBIC SPECIMEN COL (MISCELLANEOUS) ×2 IMPLANT
WATER STERILE IRR 1000ML POUR (IV SOLUTION) ×3 IMPLANT

## 2015-03-04 NOTE — Transfer of Care (Signed)
Immediate Anesthesia Transfer of Care Note  Patient: [name]  Procedure(s) Performed: Procedure(s): Incision and Drainage of LUMBAR WOUND  (N/A)  Patient Location: PACU  Anesthesia Type:General  Level of Consciousness: awake, alert , oriented and patient cooperative  Airway & Oxygen Therapy: Patient Spontanous Breathing and Patient connected to nasal cannula oxygen  Post-op Assessment: Report given to RN, Post -op Vital signs reviewed and stable and Patient moving all extremities  Post vital signs: Reviewed and stable  Last Vitals:  Filed Vitals:   03/04/15 0945 03/04/15 1329  BP: 130/66 150/64  Pulse: 78 80  Temp: 37.1 C 36.8 C  Resp: 18 18    Complications: No apparent anesthesia complications

## 2015-03-04 NOTE — Anesthesia Preprocedure Evaluation (Signed)
Anesthesia Evaluation  Patient identified by MRN, date of birth, ID band Patient awake    Reviewed: Allergy & Precautions, NPO status , Patient's Chart, lab work & pertinent test results  Airway Mallampati: II  TM Distance: >3 FB Neck ROM: Full    Dental   Pulmonary    Pulmonary exam normal        Cardiovascular hypertension, Pt. on medications + CAD and + Past MI  Normal cardiovascular exam     Neuro/Psych    GI/Hepatic   Endo/Other  diabetes, Type 2, Oral Hypoglycemic Agents  Renal/GU CRFRenal disease     Musculoskeletal   Abdominal   Peds  Hematology   Anesthesia Other Findings   Reproductive/Obstetrics                             Anesthesia Physical Anesthesia Plan  ASA: III  Anesthesia Plan: General   Post-op Pain Management:    Induction: Intravenous  Airway Management Planned: Oral ETT  Additional Equipment:   Intra-op Plan:   Post-operative Plan: Extubation in OR  Informed Consent: I have reviewed the patients History and Physical, chart, labs and discussed the procedure including the risks, benefits and alternatives for the proposed anesthesia with the patient or authorized representative who has indicated his/her understanding and acceptance.     Plan Discussed with: CRNA and Surgeon  Anesthesia Plan Comments:         Anesthesia Quick Evaluation

## 2015-03-04 NOTE — Progress Notes (Signed)
Patient ID: [name], male   DOB: 13-Jul-1940, 74 y.o.   MRN: [id number] Subjective:  The patient is alert and pleasant.  Objective: Vital signs in last 24 hours: Temp:  [98.1 F (36.7 C)-98.9 F (37.2 C)] 98.8 F (37.1 C) (11/30 0945) Pulse Rate:  [78-86] 78 (11/30 0945) Resp:  [17-20] 18 (11/30 0945) BP: (130-139)/(61-66) 130/66 mmHg (11/30 0945) SpO2:  [95 %-97 %] 95 % (11/30 0945) Weight:  [82.7 kg (182 lb 5.1 oz)] 82.7 kg (182 lb 5.1 oz) (11/29 2000)  Intake/Output from previous day: 11/29 0701 - 11/30 0700 In: -  Out: 350 [Urine:350] Intake/Output this shift:    Physical exam the patient is alert and oriented. He is moving his lower extremity as well.  The patient's wound has purulent discharge and is dehisced.  Lab Results:  Recent Labs  03/03/15 1933  WBC 11.2*  HGB 10.4*  HCT 32.6*  PLT 323   BMET  Recent Labs  03/03/15 1933  NA 141  K 4.4  CL 99*  CO2 30  GLUCOSE 100*  BUN 24*  CREATININE 1.75*  CALCIUM 10.4*    Studies/Results: Ct Lumbar Spine Wo Contrast  03/03/2015  CLINICAL DATA:  74 year old male status post lumbar surgery on 02/02/2015. Wound drainage. Back pain. Subsequent encounter. EXAM: CT LUMBAR SPINE WITHOUT CONTRAST TECHNIQUE: Multidetector CT imaging of the lumbar spine was performed without intravenous contrast administration. Multiplanar CT image reconstructions were also generated. COMPARISON:  Intraoperative radiographs 02/02/2015. CT lumbar myelogram 10/14/2014. FINDINGS: Normal lumbar segmentation Re identified. Partially visualized elevation of the left hemidiaphragm. Negative visualized lung bases. Partially visible distension of the stomach with food. Partially visible cardiac pacemaker leads. Extensive Aortoiliac calcified atherosclerosis noted. Otherwise negative visualized noncontrast abdominal viscera. No prevertebral or retroperitoneal inflammatory stranding identified. Postoperative changes to the posterior  paraspinal soft tissues from the L2 spinous process to the L5 spinous process. Small volume of soft tissue gas at the L3-L4 level, primarily superficial. See series 4, images 81-90. No fluid collection is delineated in this region. There is evidence of paraspinal muscle edema at L3-L4 (on the right) and L4-L5 (bilaterally). No intramuscular fluid collection. No acute osseous abnormality identified in the visualized lower thoracic spine. Sequelae of posterior decompression and laminectomy at the L3 and L4 levels. Chronic degenerative hypertrophy of the residual facets at these levels. Chronic vacuum disc phenomena from L2-L3 to L5-S1 appears similar to that in July. Associated circumferential disc osteophyte complex. No other acute osseous finding in the lumbar spine. Visible sacrum and SI joints appear stable. IMPRESSION: 1. Postoperative changes to the L3 and L4 posterior elements with regional soft tissue stranding. Small volume soft tissue gas at these levels. No paraspinal fluid collection is evident by CT. 2. No ventral or lateral paraspinal soft tissue stranding to strongly suggest osteomyelitis. 3. Partially visible elevation of the left hemidiaphragm, but negative visible lung bases. Chronic calcified aortic atherosclerosis. Electronically Signed   By: Odessa Fleming M.D.   On: 03/03/2015 19:15    Assessment/Plan: Wound drainage/dehiscence: I have discussed the situation with the patient is wife. I recommended he undergo an incision and drainage of his lumbar wound. I described the procedure and answered all the questions. He wants to proceed. We will plan for this afternoon.  LOS: 1 day     Dameka Younker D 03/04/2015, 10:53 AM

## 2015-03-04 NOTE — Discharge Summary (Signed)
Physician Discharge Summary  Patient ID: [name] MRN: [id number] DOB/AGE: 07-15-1940 74 y.o.  Admit date: 02/02/2015 Discharge date: 02/05/2015  Admission Diagnoses: L2-3, L3-4 and L4-5 spinal stenosis, lumbago, lumbar radiculopathy  Discharge Diagnoses: The same Active Problems:   Lumbar stenosis with neurogenic claudication   Discharged Condition: good  Hospital Course: I performed an L2-3, L3-4 and L4-5 laminectomy on the patient on 02/02/2015. The surgery went well.  The patient was progressively mobilized with physical therapy. On 02/05/2015 the patient requested discharge to home. The patient, and his wife, were given written and oral discharge instructions. All other questions were answered.  Consults: Physical therapy Significant Diagnostic Studies: None Treatments: L2-3, L3-4 and L4-5 laminectomy Discharge Exam: Blood pressure 132/62, pulse 70, temperature 98.6 F (37 C), temperature source Oral, resp. rate 18, height  (1.753 m), weight 83.7 kg (184 lb 8.4 oz), SpO2 98 %. The patient is alert and pleasant. His lower extremity strength is grossly normal. His dressing is clean and dry.  Disposition: Home  Discharge Instructions    Call MD for:  difficulty breathing, headache or visual disturbances    Complete by:  As directed      Call MD for:  extreme fatigue    Complete by:  As directed      Call MD for:  hives    Complete by:  As directed      Call MD for:  persistant dizziness or light-headedness    Complete by:  As directed      Call MD for:  persistant nausea and vomiting    Complete by:  As directed      Call MD for:  redness, tenderness, or signs of infection (pain, swelling, redness, odor or green/yellow discharge around incision site)    Complete by:  As directed      Call MD for:  severe uncontrolled pain    Complete by:  As directed      Call MD for:  temperature >100.4    Complete by:  As directed      Diet - low sodium heart healthy     Complete by:  As directed      Discharge instructions    Complete by:  As directed   Call [phone] for a followup appointment. Take a stool softener while you are using pain medications.     Driving Restrictions    Complete by:  As directed   Do not drive for 2 weeks.     Increase activity slowly    Complete by:  As directed      Lifting restrictions    Complete by:  As directed   Do not lift more than 5 pounds. No excessive bending or twisting.     May shower / Bathe    Complete by:  As directed   He may shower after the pain she is removed 3 days after surgery. Leave the incision alone.     No dressing needed    Complete by:  As directed             Medication List    STOP taking these medications        HYDROcodone-acetaminophen 10-325 MG tablet  Commonly known as:  NORCO     mupirocin ointment 2 %  Commonly known as:  BACTROBAN      TAKE these medications        acarbose 50 MG tablet  Commonly known as:  PRECOSE  Take 50  mg by mouth 3 (three) times daily with meals. Take 1 tablet 1-3 times a day     aspirin EC 81 MG tablet  Take 1 tablet (81 mg total) by mouth daily.     carvedilol 12.5 MG tablet  Commonly known as:  COREG  Take 1 tablet (12.5 mg total) by mouth 2 (two) times daily with a meal.     cyclobenzaprine 5 MG tablet  Commonly known as:  FLEXERIL  Take 1 tablet (5 mg total) by mouth 3 (three) times daily as needed for muscle spasms.     docusate sodium 100 MG capsule  Commonly known as:  COLACE  Take 300 mg by mouth 2 (two) times daily.     fluticasone 50 MCG/ACT nasal spray  Commonly known as:  FLONASE  Place 2 sprays into the nose daily as needed.     furosemide 40 MG tablet  Commonly known as:  LASIX  Take 40 mg by mouth daily.     gabapentin 300 MG capsule  Commonly known as:  NEURONTIN  Take 300 mg by mouth 3 (three) times daily.     glimepiride 2 MG tablet  Commonly known as:  AMARYL  Take 2 mg by mouth 2 (two) times daily.      guaiFENesin 600 MG 12 hr tablet  Commonly known as:  MUCINEX  Take 600 mg by mouth 2 (two) times daily as needed.     hydrALAZINE 25 MG tablet  Commonly known as:  APRESOLINE  TAKE ONE-HALF TABLET BY MOUTH THREE TIMES DAILY     isosorbide mononitrate 30 MG 24 hr tablet  Commonly known as:  IMDUR  TAKE ONE TABLET BY MOUTH ONCE DAILY     ivabradine 5 MG Tabs tablet  Commonly known as:  CORLANOR  Take 1 tablet (5 mg total) by mouth 2 (two) times daily with a meal.     magnesium hydroxide 400 MG/5ML suspension  Commonly known as:  MILK OF MAGNESIA  Take 30 mL by mouth daily as needed for constipation.     metFORMIN 500 MG tablet  Commonly known as:  GLUCOPHAGE  Take 500 mg by mouth 2 (two) times daily with a meal.     oxyCODONE-acetaminophen 5-325 MG tablet  Commonly known as:  PERCOCET/ROXICET  Take 1-2 tablets by mouth every 4 (four) hours as needed for moderate pain.     phenylephrine 0.25 % nasal spray  Commonly known as:  NEO-SYNEPHRINE  Place 1 spray into both nostrils every 6 (six) hours as needed for congestion.     pravastatin 40 MG tablet  Commonly known as:  PRAVACHOL  Take 1 tablet (40 mg total) by mouth at bedtime.     silver sulfADIAZINE 1 % cream  Commonly known as:  SILVADENE  Apply 1 application topically. For infection of the skin         Signed: Annasophia Crocker D 03/04/2015, 3:03 PM

## 2015-03-04 NOTE — Anesthesia Procedure Notes (Signed)
Procedure Name: Intubation Date/Time: 03/04/2015 5:59 PM Performed by: Jerilee Hoh Pre-anesthesia Checklist: Patient identified, Emergency Drugs available, Suction available and Patient being monitored Patient Re-evaluated:Patient Re-evaluated prior to inductionOxygen Delivery Method: Circle system utilized Preoxygenation: Pre-oxygenation with 100% oxygen Intubation Type: IV induction Ventilation: Mask ventilation without difficulty Laryngoscope Size: Glidescope and 4 Grade View: Grade I Tube type: Oral Tube size: 7.5 mm Number of attempts: 1 Airway Equipment and Method: Stylet and Video-laryngoscopy Placement Confirmation: ETT inserted through vocal cords under direct vision,  positive ETCO2 and breath sounds checked- equal and bilateral Secured at: 22 cm Tube secured with: Tape Dental Injury: Teeth and Oropharynx as per pre-operative assessment  Comments: Glidescope used d/t previous history of difficult intubation. Easy mask airway and Grade I view with Glide.

## 2015-03-04 NOTE — Care Management Note (Signed)
Case Management Note  Patient Details  Name: [name] MRN: [id number] Date of Birth: [birth year]  Subjective/Objective:                    Action/Plan: Patient was admitted with wound dehiscence and drainage following an L2-3, L3 L4 and L4 L5 laminectomy for spinal stenosis on 02/02/15.  Plan for an I&D today 03/04/15. Lives at home with wife. Will follow for discharge needs pending PT/OT evals and physician orders.  Expected Discharge Date:                  Expected Discharge Plan:     In-House Referral:     Discharge planning Services     Post Acute Care Choice:    Choice offered to:     DME Arranged:    DME Agency:     HH Arranged:    HH Agency:     Status of Service:  In process, will continue to follow  Medicare Important Message Given:    Date Medicare IM Given:    Medicare IM give by:    Date Additional Medicare IM Given:    Additional Medicare Important Message give by:     If discussed at Long Length of Stay Meetings, dates discussed:    Additional Comments:  Anda Kraft, RN 03/04/2015, 3:01 PM

## 2015-03-04 NOTE — Progress Notes (Signed)
Subjective:  The patient is somnolent but arousable. He is in no apparent distress.  Objective: Vital signs in last 24 hours: Temp:  [98.1 F (36.7 C)-98.9 F (37.2 C)] 98.2 F (36.8 C) (11/30 1329) Pulse Rate:  [78-86] 80 (11/30 1329) Resp:  [17-20] 18 (11/30 1329) BP: (130-150)/(61-66) 150/64 mmHg (11/30 1329) SpO2:  [95 %-99 %] 99 % (11/30 1329) Weight:  [82.7 kg (182 lb 5.1 oz)] 82.7 kg (182 lb 5.1 oz) (11/29 2000)  Intake/Output from previous day: 11/29 0701 - 11/30 0700 In: -  Out: 350 [Urine:350] Intake/Output this shift: Total I/O In: 400 [I.V.:400] Out: 620 [Urine:600; Blood:20]  Physical exam the patient is somnolent but arousable.  Lab Results:  Recent Labs  03/03/15 1933  WBC 11.2*  HGB 10.4*  HCT 32.6*  PLT 323   BMET  Recent Labs  03/03/15 1933  NA 141  K 4.4  CL 99*  CO2 30  GLUCOSE 100*  BUN 24*  CREATININE 1.75*  CALCIUM 10.4*    Studies/Results: Ct Lumbar Spine Wo Contrast  03/03/2015  CLINICAL DATA:  74 year old male status post lumbar surgery on 02/02/2015. Wound drainage. Back pain. Subsequent encounter. EXAM: CT LUMBAR SPINE WITHOUT CONTRAST TECHNIQUE: Multidetector CT imaging of the lumbar spine was performed without intravenous contrast administration. Multiplanar CT image reconstructions were also generated. COMPARISON:  Intraoperative radiographs 02/02/2015. CT lumbar myelogram 10/14/2014. FINDINGS: Normal lumbar segmentation Re identified. Partially visualized elevation of the left hemidiaphragm. Negative visualized lung bases. Partially visible distension of the stomach with food. Partially visible cardiac pacemaker leads. Extensive Aortoiliac calcified atherosclerosis noted. Otherwise negative visualized noncontrast abdominal viscera. No prevertebral or retroperitoneal inflammatory stranding identified. Postoperative changes to the posterior paraspinal soft tissues from the L2 spinous process to the L5 spinous process. Small volume of  soft tissue gas at the L3-L4 level, primarily superficial. See series 4, images 81-90. No fluid collection is delineated in this region. There is evidence of paraspinal muscle edema at L3-L4 (on the right) and L4-L5 (bilaterally). No intramuscular fluid collection. No acute osseous abnormality identified in the visualized lower thoracic spine. Sequelae of posterior decompression and laminectomy at the L3 and L4 levels. Chronic degenerative hypertrophy of the residual facets at these levels. Chronic vacuum disc phenomena from L2-L3 to L5-S1 appears similar to that in July. Associated circumferential disc osteophyte complex. No other acute osseous finding in the lumbar spine. Visible sacrum and SI joints appear stable. IMPRESSION: 1. Postoperative changes to the L3 and L4 posterior elements with regional soft tissue stranding. Small volume soft tissue gas at these levels. No paraspinal fluid collection is evident by CT. 2. No ventral or lateral paraspinal soft tissue stranding to strongly suggest osteomyelitis. 3. Partially visible elevation of the left hemidiaphragm, but negative visible lung bases. Chronic calcified aortic atherosclerosis. Electronically Signed   By: Odessa Fleming M.D.   On: 03/03/2015 19:15    Assessment/Plan: The patient is doing well.  LOS: 1 day     Ahron Hulbert D 03/04/2015, 6:56 PM

## 2015-03-04 NOTE — Progress Notes (Addendum)
Initial Nutrition Assessment   INTERVENTION:  Provide Ensure Enlive po BID, each supplement provides 350 kcal and 20 grams of protein Provide Multivitamin with minerals daily   NUTRITION DIAGNOSIS:   Increased nutrient needs related to wound healing as evidenced by estimated needs.   GOAL:   Patient will meet greater than or equal to 90% of their needs   MONITOR:   PO intake, Diet advancement, Labs, Weight trends, Skin  REASON FOR ASSESSMENT:   Malnutrition Screening Tool    ASSESSMENT:   Patient was admitted with wound dehiscence and drainage following an L2-3, L3 L4 and L4 L5 laminectomy for spinal stenosis on 02/02/15. Plan for an I&D today 03/04/15.   Pt states that his appetite has been mildly decreased for the past month. He has been eating about 25% less than usual and lost 2 lbs. He reports that he usual maintains his weight between 182 and 185 lbs. Pt NPO at time of visit, but previously was on a regular diet and eating 75% of meals. He reports eating protein-rich foods daily. Encouraged intake of iron-rich and vitamin C-rich foods as well.   Labs: low hemoglobin  Diet Order:  Diet NPO time specified  Skin:  Wound (see comment) (closed back incision)  Last BM:  11/28  Height:   Ht Readings from Last 1 Encounters:  03/03/15 5\' 8"  (1.727 m)    Weight:   Wt Readings from Last 1 Encounters:  03/03/15 182 lb 5.1 oz (82.7 kg)    Ideal Body Weight:  70 kg  BMI:  Body mass index is 27.73 kg/(m^2).  Estimated Nutritional Needs:   Kcal:  2000-2200  Protein:  100-110 grams  Fluid:  2-2.2 L/day  EDUCATION NEEDS:   No education needs identified at this time  Dorothea Ogle RD, LDN Inpatient Clinical Dietitian Pager: [phone] After Hours Pager: [phone]

## 2015-03-04 NOTE — Anesthesia Postprocedure Evaluation (Signed)
Anesthesia Post Note  Patient: [name]  Procedure(s) Performed: Procedure(s) (LRB): Incision and Drainage of LUMBAR WOUND  (N/A)  Patient location during evaluation: PACU Anesthesia Type: General Level of consciousness: sedated, patient cooperative and responds to stimulation Pain management: pain level controlled Vital Signs Assessment: post-procedure vital signs reviewed and stable Respiratory status: spontaneous breathing, nonlabored ventilation, respiratory function stable and patient connected to nasal cannula oxygen Cardiovascular status: blood pressure returned to baseline and stable Postop Assessment: no signs of nausea or vomiting Anesthetic complications: no    Last Vitals:  Filed Vitals:   03/04/15 2000 03/04/15 2015  BP: 167/88   Pulse: 75 76  Temp:    Resp: 11 11    Last Pain:  Filed Vitals:   03/04/15 2023  PainSc: 6     LLE Motor Response: Purposeful movement, Responds to commands LLE Sensation: Full sensation RLE Motor Response: Purposeful movement, Responds to commands RLE Sensation: Pain      Kendyll Huettner,E. Martine Trageser

## 2015-03-04 NOTE — Op Note (Signed)
Brief History: The patient is a 74 year old white male on whom I performed a lumbar laminectomy on 02/02/2015. The patient was subsequently discharged. He has had some persistent drainage of his wound. I recommend that he be admitted for treatment. I recommended an incision and drainage of his wound. We discussed the procedure and the risks. He has consented.  Preoperative diagnosis: Wound dehiscence, wound drainage  Postoperative diagnosis: The same  Procedure: Incision and drainage of wound  Surgeon: Dr. Delma Officer  Assistant: None  Anesthesia: Gen. tracheal  Estimated blood loss: Minimal  Specimens: Wound cultures  Complications: None  Drains: None  Description of procedure: The patient was brought to the operating room by the anesthesia team. General endotracheal anesthesia was induced. The patient was turned to the prone position on the Wilson frame. His lumbosacral region was then prepared with Betadine scrub and Betadine solution. Sterile drapes were applied. I did not need to incise the wound as the wound was dehisced. I inserted the cerebellar retractors which exposed a subcutaneous cavity. There wasn't much purulent material. We obtained cultures. I your given without bacitracin solution. I obtained hemostasis with bipolar electrocautery. I placed vancomycin powder in the wound and removed the retractor. I reapproximated the patient's subcutaneous tissue with Monocryl suture. I reapproximated the skin with a running nylon suture. The wound was then coated with bacitracin ointment. A sterile dressing was applied. The drapes were removed. The patient was subsequent return to the supine position. By report all sponge, instrument, and needle counts were correct at the end this case.

## 2015-03-05 ENCOUNTER — Encounter (HOSPITAL_COMMUNITY): Payer: Self-pay | Admitting: Neurosurgery

## 2015-03-05 LAB — GLUCOSE, CAPILLARY
GLUCOSE-CAPILLARY: 99 mg/dL (ref 65–99)
GLUCOSE-CAPILLARY: 121 mg/dL — AB (ref 65–99)
GLUCOSE-CAPILLARY: 108 mg/dL — AB (ref 65–99)
GLUCOSE-CAPILLARY: 182 mg/dL — AB (ref 65–99)
Glucose-Capillary: 127 mg/dL — ABNORMAL HIGH (ref 65–99)
Glucose-Capillary: 161 mg/dL — ABNORMAL HIGH (ref 65–99)

## 2015-03-05 MED ORDER — METFORMIN HCL 500 MG PO TABS
500.0000 mg | ORAL_TABLET | Freq: Two times a day (BID) | ORAL | Status: DC
  Administered 2015-03-05 – 2015-03-09 (×9): 500 mg via ORAL
  Filled 2015-03-05 (×9): qty 1

## 2015-03-05 MED ORDER — MENTHOL 3 MG MT LOZG: 1.0000 | LOZENGE | OROMUCOSAL | Status: DC | PRN

## 2015-03-05 NOTE — Progress Notes (Signed)
Pt arrived back to floor from PACU. Pt reporting burning pain at incision site. Dressing dry and intact. Neuro intact. Pain medications offered. RN will continue to monitor.

## 2015-03-05 NOTE — Progress Notes (Signed)
ANTIBIOTIC CONSULT NOTE -   Pharmacy Consult for Ceftriaxone and Vancomycin Indication: wound drainage/dehiscence  Patient Measurements: Wt: 83.7 kg  Labs:  Recent Labs  03/03/15 1933  WBC 11.2*  HGB 10.4*  PLT 323  CREATININE 1.75*    Abx:  CTX: 11/29 >>  VANC: 11/29 >>  Assessment: 74yoM admitted with wound drainage,dehiscence, and erythema s/p L2-3, L3 L4 and L4 L5 laminectomy for spinal stenosis on 02/02/15. CT scan with no evidence of osteo or fluid collection. S/p I&D on 11/30. Wound cultures with GPCs in pairs.  Goal of Therapy:  Vancomycin trough level 15-20 mcg/ml  Plan:  Ceftriaxone 2 grams IV Q24H Vancomycin 1500 mg LD; vancomycin 1000 mg Q24H starting 11/30  Monitor renal function, cultures, clinical progression.  Greggory Stallion, PharmD Clinical Pharmacy Resident Pager # [phone] 03/05/2015 9:29 AM

## 2015-03-05 NOTE — Progress Notes (Signed)
Patient ID: [name], male   DOB: 02-Aug-1940, 74 y.o.   MRN: [id number] Subjective:  the patient looks and feels better. Still having some back pain which fluctuates from the right leg from time to time.  Objective: Vital signs in last 24 hours: Temp:  [97.3 F (36.3 C)-100 F (37.8 C)] 100 F (37.8 C) (12/01 1430) Pulse Rate:  [73-102] 83 (12/01 1723) Resp:  [11-25] 20 (12/01 1430) BP: (109-173)/(52-106) 146/70 mmHg (12/01 1723) SpO2:  [96 %-100 %] 98 % (12/01 1430)  Intake/Output from previous day: 11/30 0701 - 12/01 0700 In: 400 [I.V.:400] Out: 1945 [Urine:1925; Blood:20] Intake/Output this shift: Total I/O In: -  Out: 600 [Urine:600]  Physical exam the patient is alert and oriented. He is moving his lower extremities well. His dressing is clean and dry.  Lab Results:  Recent Labs  03/03/15 1933  WBC 11.2*  HGB 10.4*  HCT 32.6*  PLT 323   BMET  Recent Labs  03/03/15 1933  NA 141  K 4.4  CL 99*  CO2 30  GLUCOSE 100*  BUN 24*  CREATININE 1.75*  CALCIUM 10.4*    Studies/Results: Ct Lumbar Spine Wo Contrast  03/03/2015  CLINICAL DATA:  74 year old male status post lumbar surgery on 02/02/2015. Wound drainage. Back pain. Subsequent encounter. EXAM: CT LUMBAR SPINE WITHOUT CONTRAST TECHNIQUE: Multidetector CT imaging of the lumbar spine was performed without intravenous contrast administration. Multiplanar CT image reconstructions were also generated. COMPARISON:  Intraoperative radiographs 02/02/2015. CT lumbar myelogram 10/14/2014. FINDINGS: Normal lumbar segmentation Re identified. Partially visualized elevation of the left hemidiaphragm. Negative visualized lung bases. Partially visible distension of the stomach with food. Partially visible cardiac pacemaker leads. Extensive Aortoiliac calcified atherosclerosis noted. Otherwise negative visualized noncontrast abdominal viscera. No prevertebral or retroperitoneal inflammatory stranding identified.  Postoperative changes to the posterior paraspinal soft tissues from the L2 spinous process to the L5 spinous process. Small volume of soft tissue gas at the L3-L4 level, primarily superficial. See series 4, images 81-90. No fluid collection is delineated in this region. There is evidence of paraspinal muscle edema at L3-L4 (on the right) and L4-L5 (bilaterally). No intramuscular fluid collection. No acute osseous abnormality identified in the visualized lower thoracic spine. Sequelae of posterior decompression and laminectomy at the L3 and L4 levels. Chronic degenerative hypertrophy of the residual facets at these levels. Chronic vacuum disc phenomena from L2-L3 to L5-S1 appears similar to that in July. Associated circumferential disc osteophyte complex. No other acute osseous finding in the lumbar spine. Visible sacrum and SI joints appear stable. IMPRESSION: 1. Postoperative changes to the L3 and L4 posterior elements with regional soft tissue stranding. Small volume soft tissue gas at these levels. No paraspinal fluid collection is evident by CT. 2. No ventral or lateral paraspinal soft tissue stranding to strongly suggest osteomyelitis. 3. Partially visible elevation of the left hemidiaphragm, but negative visible lung bases. Chronic calcified aortic atherosclerosis. Electronically Signed   By: Odessa Fleming M.D.   On: 03/03/2015 19:15    Assessment/Plan: Postop day #1: The patient is doing well. We will wait for cultures. I'll asked ID to see him tomorrow. I'll ask physical therapy to see him.   LOS: 2 days     Cielo Arias D 03/05/2015, 5:45 PM

## 2015-03-06 ENCOUNTER — Telehealth: Payer: Self-pay | Admitting: Cardiology

## 2015-03-06 DIAGNOSIS — N181 Chronic kidney disease, stage 1: Secondary | ICD-10-CM

## 2015-03-06 DIAGNOSIS — E1122 Type 2 diabetes mellitus with diabetic chronic kidney disease: Secondary | ICD-10-CM

## 2015-03-06 DIAGNOSIS — B9689 Other specified bacterial agents as the cause of diseases classified elsewhere: Secondary | ICD-10-CM

## 2015-03-06 DIAGNOSIS — Y838 Other surgical procedures as the cause of abnormal reaction of the patient, or of later complication, without mention of misadventure at the time of the procedure: Secondary | ICD-10-CM

## 2015-03-06 DIAGNOSIS — T8131XA Disruption of external operation (surgical) wound, not elsewhere classified, initial encounter: Secondary | ICD-10-CM

## 2015-03-06 LAB — GLUCOSE, CAPILLARY
GLUCOSE-CAPILLARY: 83 mg/dL (ref 65–99)
GLUCOSE-CAPILLARY: 216 mg/dL — AB (ref 65–99)
GLUCOSE-CAPILLARY: 123 mg/dL — AB (ref 65–99)
Glucose-Capillary: 114 mg/dL — ABNORMAL HIGH (ref 65–99)
Glucose-Capillary: 142 mg/dL — ABNORMAL HIGH (ref 65–99)
Glucose-Capillary: 73 mg/dL (ref 65–99)

## 2015-03-06 LAB — BASIC METABOLIC PANEL
Anion gap: 11 (ref 5–15)
BUN: 19 mg/dL (ref 6–20)
CHLORIDE: 103 mmol/L (ref 101–111)
CO2: 24 mmol/L (ref 22–32)
CREATININE: 1.13 mg/dL (ref 0.61–1.24)
Calcium: 9.3 mg/dL (ref 8.9–10.3)
GFR calc Af Amer: >60 mL/min (ref >60)
GFR calc non Af Amer: >60 mL/min (ref >60)
GLUCOSE: 50 mg/dL — AB (ref 65–99)
Potassium: 4.4 mmol/L (ref 3.5–5.1)
SODIUM: 138 mmol/L (ref 135–145)

## 2015-03-06 MED ORDER — DEXTROSE 5 % IV SOLN
1.0000 g | INTRAVENOUS | Status: DC
  Filled 2015-03-06: qty 10

## 2015-03-06 MED ORDER — DEXTROSE 5 % IV SOLN
2.0000 g | INTRAVENOUS | Status: DC
  Administered 2015-03-06 – 2015-03-07 (×2): 2 g via INTRAVENOUS
  Filled 2015-03-06 (×3): qty 2

## 2015-03-06 NOTE — Care Management Note (Signed)
Case Management Note  Patient Details  Name: [name] MRN: [id number] Date of Birth: [birth year]  Subjective/Objective:                    Action/Plan: CM met with the patient to go over home health agencies and he has selected [facility]. Pam with [facility] notified and accepted the referral. Bedside RN updated.   Expected Discharge Date:                  Expected Discharge Plan:  [place]  In-House Referral:     Discharge planning Services     Post Acute Care Choice:    Choice offered to:     DME Arranged:    DME Agency:     HH Arranged:    [place] Agency:     Status of Service:  In process, will continue to follow  Medicare Important Message Given:  Yes Date Medicare IM Given:    Medicare IM give by:    Date Additional Medicare IM Given:    Additional Medicare Important Message give by:     If discussed at [place] of Stay Meetings, dates discussed:    Additional Comments:  Pollie Friar, RN 03/06/2015, 4:56 PM

## 2015-03-06 NOTE — Care Management Note (Signed)
Case Management Note  Patient Details  Name: [name] MRN: [id number] Date of Birth: 07-21-1940  Subjective/Objective:                    Action/Plan: Plan is for patient to discharge home with IV antibiotic therapy. CM met with the patient's wife as he was asleep to discuss home health agencies in the [place]. Patients wife did not want to make the decision alone. CM went back to speak to both of them and the wife had gone for the day and the patient was eating lunch and wanted to wait to discuss home health.  Currently the patient is refusing the PICC line and home antibiotics. Bedside RN is going to page Dr Arnoldo Morale. CM will continue to follow for discharge needs.  Expected Discharge Date:                  Expected Discharge Plan:  [place]  In-House Referral:     Discharge planning Services     Post Acute Care Choice:    Choice offered to:     DME Arranged:    DME Agency:     HH Arranged:    [place] Agency:     Status of Service:  In process, will continue to follow  Medicare Important Message Given:  Yes Date Medicare IM Given:    Medicare IM give by:    Date Additional Medicare IM Given:    Additional Medicare Important Message give by:     If discussed at [place] of Stay Meetings, dates discussed:    Additional Comments:  Pollie Friar, RN 03/06/2015, 3:27 PM

## 2015-03-06 NOTE — Consult Note (Signed)
[facility] for Infectious Disease  Date of Admission:  03/03/2015  Date of Consult:  03/06/2015  Reason for Consult: Wound infection Referring Physician: Arnoldo Morale  Impression/Recommendation Wound Infection Spoke with lab- his Cx is polymicrobial so far I have asked the lab to w/u his isolates.  Continue broad therapy Place PIC  DM2 with complication Last T6L was 5.9 Will need close f/u to assure good wound healing.  ophtho f/u as outpt  CRI (stage 1) He has had fluctations in his Cr.  This has normalized for now.  Can f/u as outpt  Thank you so much for this interesting consult,   [name] (pager) [phone] [email or web address]  [name] is an 74 y.o. male.  HPI: 74 yo M with hx of DM2, CHF/Cardiomyopathy, AICD. He has had multiple spinal fusion/surgeries, the last was a L3-L5 microdiscectomy, laminectomy on 10-31. He developed wound d/c after surgery and then came to office on 11-29 with wound breakdown, erythema and d/c.  He was taken to OR 11-30 and underwent I & D.  He was started on vanco/ceftriaxone post-operatively.   Tmax in hospital 100.0 Denies f/c at home  Past Medical History  Diagnosis Date  . Hypertension   . Hyperlipidemia   . Cervical spinal stenosis   . Lumbar spinal stenosis   . 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 DVT (deep venous thrombosis) ([place]) 10/2011    RUE; pt denies this hx on 25-May-2013  . Anemia   . Cardiomyopathy ([place])     a. 10/2012 Echo: EF 20-25%, diff HK, mod dil LA/RV/RA.  Marland Kitchen CAD (coronary artery disease)   . Atrial flutter ([place])   . CKD (chronic kidney disease), stage III   . Venous insufficiency   . Frequent falls   . 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 Chronic systolic congestive heart failure ([place])   . Quadriparesis ([place]) Jun 07, 2011    pre op  . Hypoglycemia   . Cervical myelopathy ([place])   . Acute encephalopathy   . Mononeuritis of unspecified site     . Dysrhythmia   . H/O hiatal hernia   . Arthritis     "all over" (05-25-13)  . Depression     "son died in service in 06-Jun-1990; still bothers me" (25-May-2013)  . AICD (automatic cardioverter/defibrillator) present   . Shortness of breath dyspnea     with exertion  . Pneumonia 06-06-58    , also 06/06/2012  . Type II diabetes mellitus ([place])     type 2  . GERD (gastroesophageal reflux disease)   . Myocardial infarction [facility])     Past Surgical History  Procedure Laterality Date  . Total hip arthroplasty Right ~ June 06, 2010  . Toe amputation Left 12/2001; 06/2010    "2 toes" (May 25, 2013)  . Anterior cervical decomp/discectomy fusion  10/31/2011    Procedure: ANTERIOR CERVICAL DECOMPRESSION/DISCECTOMY FUSION 2 LEVELS;  Surgeon: Ophelia Charter, MD;  Location: [facility] NEURO ORS;  Service: Neurosurgery;  Laterality: N/A;  Cervical Four-Five Cervical Five-Six Anterior Cervical Decompression with fusion interbody prothesis plating and bonegraft  . Carpal tunnel release Right ~ Jun 07, 2011  . Bi-ventricular implantable cardioverter defibrillator  (crt-d) Left 05/25/2013    STJ CRTD implanted by Dr Lovena Le for primary prevention/CHF  . Cataract extraction w/ intraocular lens implant Bilateral   . Cardiac catheterization  10/2012  . Left and right heart catheterization with coronary angiogram N/A 11/06/2012    Procedure: LEFT AND  RIGHT HEART CATHETERIZATION WITH CORONARY ANGIOGRAM;  Surgeon: Larey Dresser, MD;  Location: [facility] CATH LAB;  Service: Cardiovascular;  Laterality: N/A;  . Bi-ventricular implantable cardioverter defibrillator N/A 05/16/2013    Procedure: BI-VENTRICULAR IMPLANTABLE CARDIOVERTER DEFIBRILLATOR  (CRT-D);  Surgeon: Evans Lance, MD;  Location: [facility] CATH LAB;  Service: Cardiovascular;  Laterality: N/A;  . Joint replacement  2013  . Anterior cervical decomp/discectomy fusion N/A 04/17/2014    Procedure: CERVICAL THREE TO FOUR ANTERIOR CERVICAL DECOMPRESSION/DISCECTOMY FUSION 1 LEVEL/HARDWARE REMOVAL;  Surgeon:  Newman Pies, MD;  Location: [facility] NEURO ORS;  Service: Neurosurgery;  Laterality: N/A;  C34 anterior cervical decompression with fusion interbody prosthesis plating and bonegraft with exploration of prev fusion and possible removal of old hardware  . Toe removal  Left     3rd and 4th toe removed  . Lumbar laminectomy/decompression microdiscectomy N/A 02/02/2015    Procedure: LUMBAR LAMINECTOMY/DECOMPRESSION MICRODISCECTOMY 2 LEVELS;  Surgeon: Newman Pies, MD;  Location: [facility] NEURO ORS;  Service: Neurosurgery;  Laterality: N/A;  L34 L45 laminectomy and foraminotomy  . Lumbar wound debridement N/A 03/04/2015    Procedure: Incision and Drainage of LUMBAR WOUND ;  Surgeon: Newman Pies, MD;  Location: [facility] NEURO ORS;  Service: Neurosurgery;  Laterality: N/A;     Allergies  Allergen Reactions  . Acyclovir And Related   . Dristan Cold [Chlorphen-Pe-Acetaminophen] Anxiety    Makes pt feel "wired up"  . Prednisone Other (See Comments)    Messes with blood sugar levels     Medications:  Continuous: . 0.45 % NaCl with KCl 20 mEq / L 50 mL/hr at 03/04/15 2329    Abtx:  Anti-infectives    Start     Dose/Rate Route Frequency Ordered Stop   03/06/15 1800  cefTRIAXone (ROCEPHIN) 1 g in dextrose 5 % 50 mL IVPB  Status:  Discontinued     1 g 100 mL/hr over 30 Minutes Intravenous Every 24 hours 03/06/15 1050 03/06/15 1053   03/06/15 1800  cefTRIAXone (ROCEPHIN) 2 g in dextrose 5 % 50 mL IVPB     2 g 100 mL/hr over 30 Minutes Intravenous Every 24 hours 03/06/15 1053     03/04/15 2000  vancomycin (VANCOCIN) IVPB 1000 mg/200 mL premix     1,000 mg 200 mL/hr over 60 Minutes Intravenous Every 24 hours 03/03/15 2112     03/04/15 1854  vancomycin (VANCOCIN) powder  Status:  Discontinued       As needed 03/04/15 1854 03/04/15 1854   03/04/15 1853  bacitracin 50,000 Units in sodium chloride irrigation 0.9 % 500 mL irrigation  Status:  Discontinued       As needed 03/04/15 1853 03/04/15 1854    03/04/15 1827  vancomycin (VANCOCIN) 1000 MG powder    Comments:  Loreli Dollar   : cabinet override      03/04/15 1827 03/05/15 0629   03/03/15 1800  cefTRIAXone (ROCEPHIN) 2 g in dextrose 5 % 50 mL IVPB  Status:  Discontinued     2 g 100 mL/hr over 30 Minutes Intravenous Every 24 hours 03/03/15 1750 03/06/15 1050   03/03/15 1800  vancomycin (VANCOCIN) 1,500 mg in sodium chloride 0.9 % 500 mL IVPB     1,500 mg 250 mL/hr over 120 Minutes Intravenous  Once 03/03/15 1750 03/03/15 2207      Total days of antibiotics: 2 (vanco/ceftriaxone)          Social History:  reports that he has never smoked. He has never used smokeless tobacco.  He reports that he does not drink alcohol or use illicit drugs.  Family History  Problem Relation Age of Onset  . Diabetes Brother   . Cancer Father     Lung cancer  . Heart attack Mother     pt thinks she had MI  . Cancer Mother     General ROS: normal BM, normal urine, +loss of vision R lateral gaze, denies numbness or tingling in hands or feet.  Please see HPI. 12 point ROS o/w (-)  Blood pressure 131/59, pulse 80, temperature 98.4 F (36.9 C), temperature source Oral, resp. rate 16, height 5' 8"  (1.727 m), weight 82.7 kg (182 lb 5.1 oz), SpO2 98 %. General appearance: alert, cooperative, no distress and pale Eyes: negative findings: conjunctivae and sclerae normal and pupils equal, round, reactive to light and accomodation Throat: normal findings: oropharynx pink & moist without lesions or evidence of thrush Neck: no adenopathy and supple, symmetrical, trachea midline Lungs: clear to auscultation bilaterally Heart: regular rate and rhythm Abdomen: normal findings: bowel sounds normal and soft, non-tender Extremities: edema none and toe missing LLE Neurologic: Sensory: slightly decreased LLE vs RLE back wound- dressed.    Results for orders placed or performed during the hospital encounter of 03/03/15 (from the past 48 hour(s))  Glucose,  capillary     Status: Abnormal   Collection Time: 03/04/15  4:11 PM  Result Value Ref Range   Glucose-Capillary 165 (H) 65 - 99 mg/dL  Wound culture     Status: None (Preliminary result)   Collection Time: 03/04/15  6:55 PM  Result Value Ref Range   Specimen Description WOUND BACK    Special Requests PATIENT ON FOLLOWING ROCEPHIN    Gram Stain      ABUNDANT WBC PRESENT, PREDOMINANTLY PMN NO SQUAMOUS EPITHELIAL CELLS SEEN FEW GRAM POSITIVE COCCI IN PAIRS Performed at [facility]    Culture      Culture reincubated for better growth Performed at [facility]    Report Status PENDING   Anaerobic culture     Status: None (Preliminary result)   Collection Time: 03/04/15  6:55 PM  Result Value Ref Range   Specimen Description WOUND BACK    Special Requests PATIENT ON FOLLOWING ROCEPHIN    Gram Stain      MODERATE WBC PRESENT, PREDOMINANTLY PMN NO SQUAMOUS EPITHELIAL CELLS SEEN FEW GRAM POSITIVE COCCI IN PAIRS Performed at [facility]; CULTURE IN PROGRESS FOR 5 DAYS Performed at [facility]    Report Status PENDING   Glucose, capillary     Status: None   Collection Time: 03/04/15  6:56 PM  Result Value Ref Range   Glucose-Capillary 99 65 - 99 mg/dL   Comment 1 Notify RN    Comment 2 Document in Chart   Glucose, capillary     Status: None   Collection Time: 03/04/15  8:46 PM  Result Value Ref Range   Glucose-Capillary 80 65 - 99 mg/dL   Comment 1 Notify RN    Comment 2 Document in Chart   Glucose, capillary     Status: Abnormal   Collection Time: 03/05/15 12:02 AM  Result Value Ref Range   Glucose-Capillary 161 (H) 65 - 99 mg/dL   Comment 1 Notify RN    Comment 2 Document in Chart   Glucose, capillary     Status: Abnormal   Collection Time: 03/05/15  4:01 AM  Result Value Ref Range   Glucose-Capillary 127 (H) 65 - 99 mg/dL   Comment 1 Notify RN    Comment 2 Document in Chart   Glucose,  capillary     Status: Abnormal   Collection Time: 03/05/15 12:46 PM  Result Value Ref Range   Glucose-Capillary 121 (H) 65 - 99 mg/dL  Glucose, capillary     Status: Abnormal   Collection Time: 03/05/15  4:47 PM  Result Value Ref Range   Glucose-Capillary 108 (H) 65 - 99 mg/dL  Glucose, capillary     Status: Abnormal   Collection Time: 03/05/15  8:02 PM  Result Value Ref Range   Glucose-Capillary 182 (H) 65 - 99 mg/dL   Comment 1 Notify RN    Comment 2 Document in Chart   Glucose, capillary     Status: Abnormal   Collection Time: 03/06/15 12:05 AM  Result Value Ref Range   Glucose-Capillary 114 (H) 65 - 99 mg/dL   Comment 1 Notify RN    Comment 2 Document in Chart   Glucose, capillary     Status: None   Collection Time: 03/06/15  4:10 AM  Result Value Ref Range   Glucose-Capillary 73 65 - 99 mg/dL   Comment 1 Notify RN    Comment 2 Document in Chart   Basic metabolic panel     Status: Abnormal   Collection Time: 03/06/15  5:30 AM  Result Value Ref Range   Sodium 138 135 - 145 mmol/L   Potassium 4.4 3.5 - 5.1 mmol/L   Chloride 103 101 - 111 mmol/L   CO2 24 22 - 32 mmol/L   Glucose, Bld 50 (L) 65 - 99 mg/dL   BUN 19 6 - 20 mg/dL   Creatinine, Ser 1.13 0.61 - 1.24 mg/dL   Calcium 9.3 8.9 - 10.3 mg/dL   GFR calc non Af Amer >60 >60 mL/min   GFR calc Af Amer >60 >60 mL/min    Comment: (NOTE) The eGFR has been calculated using the CKD EPI equation. This calculation has not been validated in all clinical situations. eGFR's persistently <60 mL/min signify possible Chronic Kidney Disease.    Anion gap 11 5 - 15  Glucose, capillary     Status: None   Collection Time: 03/06/15  8:12 AM  Result Value Ref Range   Glucose-Capillary 83 65 - 99 mg/dL   Comment 1 Notify RN    Comment 2 Document in Chart   Glucose, capillary     Status: Abnormal   Collection Time: 03/06/15 12:09 PM  Result Value Ref Range   Glucose-Capillary 216 (H) 65 - 99 mg/dL   Comment 1 Notify RN     Comment 2 Document in Chart       Component Value Date/Time   SDES WOUND BACK 03/04/2015 1855   SDES WOUND BACK 03/04/2015 1855   SPECREQUEST PATIENT ON FOLLOWING ROCEPHIN 03/04/2015 1855   SPECREQUEST PATIENT ON FOLLOWING ROCEPHIN 03/04/2015 1855   CULT  03/04/2015 1855    Culture reincubated for better growth Performed at [facility]  03/04/2015 1855    NO ANAEROBES ISOLATED; CULTURE IN PROGRESS FOR 5 DAYS Performed at [facility] PENDING 03/04/2015 1855   REPTSTATUS PENDING 03/04/2015 1855   No results found. Recent Results (from the past 240 hour(s))  Culture, blood (routine x 2)     Status: None (Preliminary result)   Collection Time: 03/03/15  7:33 PM  Result Value Ref Range Status   Specimen Description BLOOD RIGHT ANTECUBITAL  Final   Special Requests BOTTLES DRAWN AEROBIC AND ANAEROBIC 5CC  Final   Culture NO GROWTH 3 DAYS  Final   Report Status PENDING  Incomplete  Culture, blood (routine x 2)     Status: None (Preliminary result)   Collection Time: 03/03/15  7:42 PM  Result Value Ref Range Status   Specimen Description BLOOD LEFT ANTECUBITAL  Final   Special Requests BOTTLES DRAWN AEROBIC ONLY 5CC  Final   Culture NO GROWTH 3 DAYS  Final   Report Status PENDING  Incomplete  Wound culture     Status: None (Preliminary result)   Collection Time: 03/04/15  6:55 PM  Result Value Ref Range Status   Specimen Description WOUND BACK  Final   Special Requests PATIENT ON FOLLOWING ROCEPHIN  Final   Gram Stain   Final    ABUNDANT WBC PRESENT, PREDOMINANTLY PMN NO SQUAMOUS EPITHELIAL CELLS SEEN FEW GRAM POSITIVE COCCI IN PAIRS Performed at [facility]    Culture   Final    Culture reincubated for better growth Performed at [facility]    Report Status PENDING  Incomplete  Anaerobic culture     Status: None (Preliminary result)   Collection Time: 03/04/15  6:55 PM  Result Value Ref Range Status   Specimen  Description WOUND BACK  Final   Special Requests PATIENT ON FOLLOWING ROCEPHIN  Final   Gram Stain   Final    MODERATE WBC PRESENT, PREDOMINANTLY PMN NO SQUAMOUS EPITHELIAL CELLS SEEN FEW GRAM POSITIVE COCCI IN PAIRS Performed at [facility]    Culture   Final    NO ANAEROBES ISOLATED; CULTURE IN PROGRESS FOR 5 DAYS Performed at [facility]    Report Status PENDING  Incomplete      03/06/2015, 3:34 PM     LOS: 3 days    Records and images were personally reviewed where available.

## 2015-03-06 NOTE — Care Management Important Message (Signed)
Important Message  Patient Details  Name: [name] MRN: [id number] Date of Birth: January 03, 1941   Medicare Important Message Given:  Yes    Ezabella Teska P Lee Kalt 03/06/2015, 2:09 PM

## 2015-03-06 NOTE — Progress Notes (Signed)
Patient and wife do not PICC placed at this time.  Wife wants to wait until ID see him and patient doesn't understand why he needs the PICC and antibiotics at home.  Will update bedside nurse and await ID to see patient. Thank you,  Gasper Lloyd, RN VAST/PICC

## 2015-03-06 NOTE — Telephone Encounter (Signed)
LMOVM requesting that pt send a remote transmission from home monitor b/c we have not received one in at least 8 days.  

## 2015-03-06 NOTE — Progress Notes (Signed)
Patient ID: [name], male   DOB: 1940/10/27, 74 y.o.   MRN: [id number] Subjective:  The patient is resting comfortably. I spoke with his wife.  Objective: Vital signs in last 24 hours: Temp:  [97.3 F (36.3 C)-100 F (37.8 C)] 98.6 F (37 C) (12/02 0619) Pulse Rate:  [74-102] 80 (12/02 0619) Resp:  [16-20] 17 (12/02 0619) BP: (109-154)/(49-73) 123/49 mmHg (12/02 0619) SpO2:  [97 %-99 %] 97 % (12/02 0619)  Intake/Output from previous day: 12/01 0701 - 12/02 0700 In: -  Out: 2500 [Urine:2500] Intake/Output this shift:    Physical exam the patient is sleeping comfortably.  Lab Results:  Recent Labs  03/03/15 1933  WBC 11.2*  HGB 10.4*  HCT 32.6*  PLT 323   BMET  Recent Labs  03/03/15 1933  NA 141  K 4.4  CL 99*  CO2 30  GLUCOSE 100*  BUN 24*  CREATININE 1.75*  CALCIUM 10.4*    Studies/Results: No results found.  Assessment/Plan: Postop day #2: I discussed the situation with the patient last night and his wife again this morning. His Gram stain demonstrated gram-positive cocci. We are awaiting cultures. I will ask ID to see the patient to help [place] direct with which antibiotic and for how long he should be treated. We'll place a PICC line and begin to arrange for home IV antibiotics.  LOS: 3 days     [name] 03/06/2015, 7:53 AM

## 2015-03-06 NOTE — Progress Notes (Signed)
Occupational Therapy Evaluation Patient Details Name: [name] MRN: [id number] DOB: [birth year] Today's Date: 03/06/2015    History of Present Illness 74 y.o. male s/p incision and drainage of wound from a lumbar laminectomy performed on 02/02/2015. PMH significant for HTN, CAD, MI, DVT, DM type II, GERD, SOB, CRPS of RUE and RLE, CKD stage III, depression, and frequent falls.   Clinical Impression   PTA, pt required assist from wife for ADL and IADL while recovering from lumbar laminectomy performed 02/02/2015. Pt was very agitated and impulsive during session. Pt refused to follow suggestions for safety or compensatory strategies to adhere to back precautions. Pt has a hx of frequent falls and is at increased risk of falling due to impulsivity and impatience. Pt required verbal cues to wait for assistance before attempting to stand and to avoid obstacles in pathway instead of pushing them out of the way with RW. Pt was min guard-min assist for functional mobility and ADL. Pt's wife reports she will continue to assist him with ADL upon d/c. Recommending HHOT for fall risk evaluation and to increase independence and safety with ADL. Will continue to follow acutely.    Follow Up Recommendations  Home health OT;Supervision/Assistance - 24 hour    Equipment Recommendations  None recommended by OT    Recommendations for Other Services       Precautions / Restrictions Precautions Precautions: Back;Fall Precaution Booklet Issued: Yes (comment) Precaution Comments: Pt refused to review precautions. Able to recall 1/3 (no bending). Restrictions Weight Bearing Restrictions: No      Mobility Bed Mobility Overal bed mobility: Needs Assistance Bed Mobility: Supine to Sit     Supine to sit: Mod assist;HOB elevated     General bed mobility comments: Pt attempting to sit EOB to use urinal on OT arrival with legs off bed and trunk horizontal in bed. Mod assist to come to sitting  position. Pt refused to attempt log roll technique due to need to urinate.   Transfers Overall transfer level: Needs assistance Equipment used: Rolling walker (2 wheeled) Transfers: Sit to/from Stand Sit to Stand: Min assist         General transfer comment: Min assist for boost to stand. Verbal cues for safe hand placement on seated surfaces. Pt angrily refusing assistance to stand, but was unable to stand without it.    Balance Overall balance assessment: Needs assistance Sitting-balance support: Bilateral upper extremity supported;Feet supported Sitting balance-Leahy Scale: Fair Sitting balance - Comments: Rests UE's on knees for balance   Standing balance support: Bilateral upper extremity supported;During functional activity Standing balance-Leahy Scale: Poor Standing balance comment: Hx of frequent falls, extremely kyphotic, required support of counter while at sink                            ADL Overall ADL's : Needs assistance/impaired Eating/Feeding: Set up;Supervision/ safety;With caregiver independent assisting;Sitting   Grooming: Wash/dry hands;Wash/dry face;Min guard;Cueing for compensatory techniques;Standing;Cueing for safety Grooming Details (indicate cue type and reason): Verbal cues to adhere to back precautions and for compensatory strategies to avoid bending     Lower Body Bathing: Min guard;Cueing for safety;Sit to/from stand Lower Body Bathing Details (indicate cue type and reason): Verbal cues to adhere to back precautions         Toilet Transfer: Minimal assistance;Cueing for safety;Ambulation;BSC;Grab bars;RW   Toileting- Clothing Manipulation and Hygiene: Min guard;Cueing for safety;Cueing for back precautions;Sit to/from stand  Functional mobility during ADLs: Min guard;Cueing for safety;Rolling walker General ADL Comments: Pt was agitated, impulsive, and has a hx of frequent falls. Pt's wife reported they have lots of DME and  she assists with ADL and IADL as needed. Pt was min guard-min assist for transfers and mobility. Pt extremely kyphotic in sitting and standing and required support from sink counter or arms on knees to maintain balance. Pt completed sink level ADL and LB bathing, but was not adhering to precautions and was angrily responded to compensatory suggestions. Pt refused to follow any suggestions for safety or to follow precautions.     Vision Vision Assessment?: No apparent visual deficits   Perception     Praxis      Pertinent Vitals/Pain Pain Assessment: 0-10 Pain Score: 6  Pain Location: back and arms Pain Descriptors / Indicators: Sore Pain Intervention(s): Limited activity within patient's tolerance;Monitored during session;Repositioned     Hand Dominance     Extremity/Trunk Assessment     Lower Extremity Assessment Lower Extremity Assessment: Defer to PT evaluation   Cervical / Trunk Assessment Cervical / Trunk Assessment: Kyphotic Cervical / Trunk Exceptions: Head forward and severely kyphotic even in sitting    Communication Communication Communication: HOH   Cognition Arousal/Alertness: Awake/alert Behavior During Therapy: Flat affect;Agitated Overall Cognitive Status: Impaired/Different from baseline Area of Impairment: Safety/judgement;Awareness;Problem solving         Safety/Judgement: Decreased awareness of safety Awareness: Emergent Problem Solving: Requires verbal cues;Slow processing     General Comments       Exercises       Shoulder Instructions      Home Living Family/patient expects to be discharged to:: Private residence Living Arrangements: Spouse/significant other Available Help at Discharge: Family Type of Home: House Home Access: Stairs to enter [employer] of Steps: 2 Entrance Stairs-Rails: Right Home Layout: Multi-level;Able to live on main level with bedroom/bathroom     Bathroom Shower/Tub: Tub/shower  unit;Door Shower/tub characteristics: Door       Home Equipment: Environmental consultant - 4 wheels;Bedside commode;Wheelchair - manual;Hospital bed;Hand held Careers information officer - 2 wheels;Grab bars - tub/shower;Shower seat          Prior Functioning/Environment Level of Independence: Needs assistance  Gait / Transfers Assistance Needed: SPC, RW ADL's / Homemaking Assistance Needed: Wife assisted with all ADLs and IADLs as needed while recovering from back surgery performed on 02/02/15        OT Diagnosis: Generalized weakness;Cognitive deficits;Acute pain   OT Problem List: Decreased strength;Decreased range of motion;Decreased activity tolerance;Impaired balance (sitting and/or standing);Decreased coordination;Decreased cognition;Decreased safety awareness;Decreased knowledge of use of DME or AE;Decreased knowledge of precautions;Pain   OT Treatment/Interventions: Self-care/ADL training;DME and/or AE instruction;Therapeutic activities;Patient/family education;Balance training    OT Goals(Current goals can be found in the care plan section) Acute Rehab OT Goals Patient Stated Goal: none stated OT Goal Formulation: With patient Time For Goal Achievement: 03/20/15 Potential to Achieve Goals: Fair ADL Goals Pt Will Perform Upper Body Bathing: with modified independence;with adaptive equipment;sitting;with caregiver independent in assisting Pt Will Perform Lower Body Bathing: with supervision;with caregiver independent in assisting;with adaptive equipment;sitting/lateral leans;sit to/from stand Pt Will Transfer to Toilet: with supervision;ambulating;bedside commode Pt Will Perform Toileting - Clothing Manipulation and hygiene: with supervision;with adaptive equipment;sitting/lateral leans;sit to/from stand Pt Will Perform Tub/Shower Transfer: Tub transfer;with min guard assist;ambulating;shower seat;grab bars;rolling walker Additional ADL Goal #1: Pt will verbalize 3/3 back precautions to increase  safety with ADL.  OT Frequency: Min 2X/week   Barriers to D/C:  Co-evaluation              End of Session Equipment Utilized During Treatment: Gait belt;Rolling walker Nurse Communication: Mobility status;Precautions  Activity Tolerance: Patient tolerated treatment well Patient left: in bed;with bed alarm set;with call bell/phone within reach;with family/visitor present   Time: 0900-0930 OT Time Calculation (min): 30 min Charges:  OT General Charges $OT Visit: 1 Procedure OT Evaluation $Initial OT Evaluation Tier I: 1 Procedure OT Treatments $Self Care/Home Management : 8-22 mins G-Codes:    Nils Pyle 2015-04-04, 10:14 AM

## 2015-03-06 NOTE — Progress Notes (Addendum)
PT Cancellation Note  Patient Details Name: [name] MRN: [id number] DOB: February 21, 1941   Cancelled Treatment:    Reason Eval/Treat Not Completed: Pt adamantly refusing any attempt to work with PT at this time. Pt asked therapist not to come back, and states he does not need physical therapy. Encouraged participation and offered position change to eat lunch in the chair. Pt declined and states he just wants to sleep. RN notified.   Per chart review, it appears that pt has a history of falls and requires min assist for some mobility. Would recommend HHPT to follow-up if pt is agreeable. PT signing off per pt's request. If pt decides he would like to participate, please reconsult.   Conni Slipper 03/06/2015, 11:50 AM   Conni Slipper, PT, DPT Acute Rehabilitation Services Pager: [phone]

## 2015-03-07 LAB — GLUCOSE, CAPILLARY
GLUCOSE-CAPILLARY: 64 mg/dL — AB (ref 65–99)
GLUCOSE-CAPILLARY: 121 mg/dL — AB (ref 65–99)
Glucose-Capillary: 152 mg/dL — ABNORMAL HIGH (ref 65–99)
Glucose-Capillary: 61 mg/dL — ABNORMAL LOW (ref 65–99)
Glucose-Capillary: 88 mg/dL (ref 65–99)
Glucose-Capillary: 94 mg/dL (ref 65–99)

## 2015-03-07 LAB — VANCOMYCIN, TROUGH: Vancomycin Tr: 16 ug/mL (ref 10.0–20.0)

## 2015-03-07 MED ORDER — NAPHAZOLINE-PHENIRAMINE 0.025-0.3 % OP SOLN
1.0000 [drp] | OPHTHALMIC | Status: DC | PRN
  Administered 2015-03-07: 1 [drp] via OPHTHALMIC
  Filled 2015-03-07 (×2): qty 5

## 2015-03-07 MED ORDER — NAPHAZOLINE HCL 0.1 % OP SOLN
2.0000 [drp] | OPHTHALMIC | Status: DC | PRN
  Filled 2015-03-07: qty 15

## 2015-03-07 NOTE — Progress Notes (Signed)
Occupational Therapy Treatment Patient Details Name: [name] MRN: [id number] DOB: 1940-07-18 Today's Date: 03/07/2015    History of present illness 74 y.o. male s/p incision and drainage of wound from a lumbar laminectomy performed on 02/02/2015. PMH significant for HTN, CAD, MI, DVT, DM type II, GERD, SOB, CRPS of RUE and RLE, CKD stage III, depression, and frequent falls.   OT comments  Pt. Seen for skilled OT.  Much more agreeable than previously documented.  Pleasant mood and completed toileting and tub transfer tasks.  Able to recall and integrate 3/3 back precautions during session.  Acute OT goals met.  Clear for d/c from OT.  Discussed with pt. And he has no further questions or concerns from OT standpoint.   Will alert OTR/L to sign off.    Follow Up Recommendations  Home health OT;Supervision/Assistance - 24 hour    Equipment Recommendations  None recommended by OT    Recommendations for Other Services      Precautions / Restrictions Precautions Precautions: Back;Fall Precaution Comments: pt. stated 3/3 precautions        Mobility Bed Mobility Overal bed mobility: Needs Assistance Bed Mobility: Supine to Sit     Supine to sit: Min assist     General bed mobility comments: states wife "gives him a little boost" at home to transition from supine to sit  Transfers Overall transfer level: Needs assistance Equipment used: Rolling walker (2 wheeled) Transfers: Sit to/from [employer] Sit to Stand: Supervision Stand pivot transfers: Supervision            Balance                                   ADL Overall ADL's : Needs assistance/impaired                         Toilet Transfer: Supervision/safety;Ambulation;RW;Regular Toilet;BSC Toilet Transfer Details (indicate cue type and reason): bsc over the toilet Toileting- Clothing Manipulation and Hygiene: Supervision/safety;Sit to/from stand   Tub/ Shower  Transfer: Tub transfer;Rolling walker;Grab bars;Min Chief Executive Officer Details (indicate cue type and reason): pt. side stepped over ledge of walk in shower to simulate side stepping over tub with addtional clearance added to mimic height of tub Functional mobility during ADLs: Supervision/safety General ADL Comments: pt. in much better mood and tolerance for skilled OT than previously noted sessions.  agreeabele to particpation and completion of acute OT goals.  performed toileting and tub transfer with S/min guard a. recalled 3/3 back precautions.        Vision                     Perception     Praxis      Cognition   Behavior During Therapy: WFL for tasks assessed/performed Overall Cognitive Status: Within Functional Limits for tasks assessed                       Extremity/Trunk Assessment               Exercises     Shoulder Instructions       General Comments      Pertinent Vitals/ Pain       Pain Assessment: No/denies pain  Home Living  Prior Functioning/Environment              Frequency Min 2X/week     Progress Toward Goals  OT Goals(current goals can now be found in the care plan section)  Progress towards OT goals: Goals met/education completed, patient discharged from [facility] Discharge plan remains appropriate    Co-evaluation                 End of Session Equipment Utilized During Treatment: Gait belt;Rolling walker   Activity Tolerance Patient tolerated treatment well   Patient Left in chair;with call bell/phone within reach;with chair alarm set   Nurse Communication          Time: 8937-3428 OT Time Calculation (min): 21 min  Charges: OT General Charges $OT Visit: 1 Procedure OT Treatments $Self Care/Home Management : 8-22 mins  Janice Coffin, COTA/L 03/07/2015, 11:14 AM

## 2015-03-07 NOTE — Progress Notes (Signed)
[facility]  Patient Status:   New pt for [facility] this admission  [facility] is providing the following services: HHRN and Home Infusion Pharmacy team for IV ABX at home.  Met with pt and discussed steps for IV ABX set up and administration tonight in his room.  Pt states his wife will administer the IVABX at home. [facility] is prepared for weekend DC, but we will need IV ABX scripts.    If patient discharges after hours, please call [phone].   Larry Sierras 03/07/2015, 12:11 AM

## 2015-03-07 NOTE — Progress Notes (Signed)
ANTIBIOTIC CONSULT NOTE - FOLLOW UP  Pharmacy Consult for Vancomycin and Ceftriaxone Indication: wound infection  Patient Measurements: Height: 5\' 8"  (172.7 cm) Weight: 182 lb 5.1 oz (82.7 kg) IBW/kg (Calculated) : 68.4  Vital Signs: Temp: 97.9 F (36.6 C) (12/03 1825) Temp Source: Oral (12/03 1825) BP: 135/60 mmHg (12/03 1825) Pulse Rate: 68 (12/03 1825)  Labs:  Recent Labs  03/06/15 0530  CREATININE 1.13   Estimated Creatinine Clearance: 60.1 mL/min (by C-G formula based on Cr of 1.13).  Recent Labs  03/07/15 1910  VANCOTROUGH 16     Microbiology: Recent Results (from the past 720 hour(s))  Culture, blood (routine x 2)     Status: None (Preliminary result)   Collection Time: 03/03/15  7:33 PM  Result Value Ref Range Status   Specimen Description BLOOD RIGHT ANTECUBITAL  Final   Special Requests BOTTLES DRAWN AEROBIC AND ANAEROBIC 5CC  Final   Culture NO GROWTH 4 DAYS  Final   Report Status PENDING  Incomplete  Culture, blood (routine x 2)     Status: None (Preliminary result)   Collection Time: 03/03/15  7:42 PM  Result Value Ref Range Status   Specimen Description BLOOD LEFT ANTECUBITAL  Final   Special Requests BOTTLES DRAWN AEROBIC ONLY 5CC  Final   Culture NO GROWTH 4 DAYS  Final   Report Status PENDING  Incomplete  Wound culture     Status: None (Preliminary result)   Collection Time: 03/04/15  6:55 PM  Result Value Ref Range Status   Specimen Description WOUND BACK  Final   Special Requests PATIENT ON FOLLOWING ROCEPHIN  Final   Gram Stain   Final    ABUNDANT WBC PRESENT, PREDOMINANTLY PMN NO SQUAMOUS EPITHELIAL CELLS SEEN FEW GRAM POSITIVE COCCI IN PAIRS Performed at [facility]    Culture   Final    Culture reincubated for better growth Performed at [facility]    Report Status PENDING  Incomplete  Anaerobic culture     Status: None (Preliminary result)   Collection Time: 03/04/15  6:55 PM  Result Value Ref Range Status   Specimen Description WOUND BACK  Final   Special Requests PATIENT ON FOLLOWING ROCEPHIN  Final   Gram Stain   Final    MODERATE WBC PRESENT, PREDOMINANTLY PMN NO SQUAMOUS EPITHELIAL CELLS SEEN FEW GRAM POSITIVE COCCI IN PAIRS Performed at [facility]    Culture   Final    NO ANAEROBES ISOLATED; CULTURE IN PROGRESS FOR 5 DAYS Performed at [facility]    Report Status PENDING  Incomplete   Assessment:   Day # 5 Vancomycin and Ceftriaxone for back wound infection s/p lumbar surgery 02/02/15.  S/p I&D on 11/30.  Creatinine 1.75>>1.13.     Vanc trough level tonight is 16 mg/ml, on target.  Goal of Therapy:  Vancomycin trough level 15-20 mcg/ml  Plan:    Continue Vancomycin 1 gram IV q24hrs.  Daily dose at 8pm.   Continue Ceftriaxone 2 grams IV q24hrs.   Follow renal function, culture data, progress.  Dennie Fetters, [place] Pager: [phone] 03/07/2015,9:08 PM

## 2015-03-07 NOTE — Progress Notes (Signed)
INFECTIOUS DISEASE PROGRESS NOTE  ID: [name] is a 74 y.o. male with  Active Problems:   Wound dehiscence  Subjective: Speaking with PT  Abtx:  Anti-infectives    Start     Dose/Rate Route Frequency Ordered Stop   03/06/15 1800  cefTRIAXone (ROCEPHIN) 1 g in dextrose 5 % 50 mL IVPB  Status:  Discontinued     1 g 100 mL/hr over 30 Minutes Intravenous Every 24 hours 03/06/15 1050 03/06/15 1053   03/06/15 1800  cefTRIAXone (ROCEPHIN) 2 g in dextrose 5 % 50 mL IVPB     2 g 100 mL/hr over 30 Minutes Intravenous Every 24 hours 03/06/15 1053     03/04/15 2000  vancomycin (VANCOCIN) IVPB 1000 mg/200 mL premix     1,000 mg 200 mL/hr over 60 Minutes Intravenous Every 24 hours 03/03/15 2112     03/04/15 1854  vancomycin (VANCOCIN) powder  Status:  Discontinued       As needed 03/04/15 1854 03/04/15 1854   03/04/15 1853  bacitracin 50,000 Units in sodium chloride irrigation 0.9 % 500 mL irrigation  Status:  Discontinued       As needed 03/04/15 1853 03/04/15 1854   03/04/15 1827  vancomycin (VANCOCIN) 1000 MG powder    Comments:  Dorinda Hill   : cabinet override      03/04/15 1827 03/05/15 0629   03/03/15 1800  cefTRIAXone (ROCEPHIN) 2 g in dextrose 5 % 50 mL IVPB  Status:  Discontinued     2 g 100 mL/hr over 30 Minutes Intravenous Every 24 hours 03/03/15 1750 03/06/15 1050   03/03/15 1800  vancomycin (VANCOCIN) 1,500 mg in sodium chloride 0.9 % 500 mL IVPB     1,500 mg 250 mL/hr over 120 Minutes Intravenous  Once 03/03/15 1750 03/03/15 2207      Medications:  Scheduled: . acarbose  50 mg Oral TID WC  . aspirin EC  81 mg Oral Daily  . carvedilol  12.5 mg Oral BID WC  . cefTRIAXone (ROCEPHIN)  IV  2 g Intravenous Q24H  . docusate sodium  100 mg Oral BID  . feeding supplement (ENSURE ENLIVE)  237 mL Oral BID BM  . fluticasone  2 spray Each Nare Daily  . furosemide  40 mg Oral Daily  . gabapentin  300 mg Oral TID  . glimepiride  2 mg Oral BID  . guaiFENesin  600 mg  Oral BID  . hydrALAZINE  25 mg Oral 3 times per day  . insulin aspart  0-20 Units Subcutaneous 6 times per day  . isosorbide mononitrate  30 mg Oral Daily  . ivabradine  5 mg Oral BID WC  . metFORMIN  500 mg Oral BID WC  . multivitamin with minerals  1 tablet Oral Daily  . pravastatin  40 mg Oral QHS  . vancomycin  1,000 mg Intravenous Q24H    Objective: Vital signs in last 24 hours: Temp:  [98.1 F (36.7 C)-98.8 F (37.1 C)] 98.1 F (36.7 C) (12/03 1006) Pulse Rate:  [70-91] 91 (12/03 1006) Resp:  [16-17] 17 (12/03 1006) BP: (128-152)/(59-79) 128/79 mmHg (12/03 1006) SpO2:  [98 %-100 %] 99 % (12/03 1006)   General appearance: alert, cooperative and no distress Resp: clear to auscultation bilaterally Cardio: regular rate and rhythm GI: normal findings: bowel sounds normal and soft, non-tender  Lab Results  Recent Labs  03/06/15 0530  NA 138  K 4.4  CL 103  CO2 24  BUN  19  CREATININE 1.13   Liver Panel No results for input(s): PROT, ALBUMIN, AST, ALT, ALKPHOS, BILITOT, BILIDIR, IBILI in the last 72 hours. Sedimentation Rate No results for input(s): ESRSEDRATE in the last 72 hours. C-Reactive Protein No results for input(s): CRP in the last 72 hours.  Microbiology: Recent Results (from the past 240 hour(s))  Culture, blood (routine x 2)     Status: None (Preliminary result)   Collection Time: 03/03/15  7:33 PM  Result Value Ref Range Status   Specimen Description BLOOD RIGHT ANTECUBITAL  Final   Special Requests BOTTLES DRAWN AEROBIC AND ANAEROBIC 5CC  Final   Culture NO GROWTH 3 DAYS  Final   Report Status PENDING  Incomplete  Culture, blood (routine x 2)     Status: None (Preliminary result)   Collection Time: 03/03/15  7:42 PM  Result Value Ref Range Status   Specimen Description BLOOD LEFT ANTECUBITAL  Final   Special Requests BOTTLES DRAWN AEROBIC ONLY 5CC  Final   Culture NO GROWTH 3 DAYS  Final   Report Status PENDING  Incomplete  Wound culture      Status: None (Preliminary result)   Collection Time: 03/04/15  6:55 PM  Result Value Ref Range Status   Specimen Description WOUND BACK  Final   Special Requests PATIENT ON FOLLOWING ROCEPHIN  Final   Gram Stain   Final    ABUNDANT WBC PRESENT, PREDOMINANTLY PMN NO SQUAMOUS EPITHELIAL CELLS SEEN FEW GRAM POSITIVE COCCI IN PAIRS Performed at [facility]    Culture   Final    Culture reincubated for better growth Performed at [facility]    Report Status PENDING  Incomplete  Anaerobic culture     Status: None (Preliminary result)   Collection Time: 03/04/15  6:55 PM  Result Value Ref Range Status   Specimen Description WOUND BACK  Final   Special Requests PATIENT ON FOLLOWING ROCEPHIN  Final   Gram Stain   Final    MODERATE WBC PRESENT, PREDOMINANTLY PMN NO SQUAMOUS EPITHELIAL CELLS SEEN FEW GRAM POSITIVE COCCI IN PAIRS Performed at [facility]    Culture   Final    NO ANAEROBES ISOLATED; CULTURE IN PROGRESS FOR 5 DAYS Performed at [facility]    Report Status PENDING  Incomplete    Studies/Results: No results found.   Assessment/Plan: Wound infection Await Cx Await PIC  DM2 One episode of low FSG, otherwise well controlled  CRI Normalized at last blood draw  Total days of antibiotics: 3 vanco/ceftriaxone         Johny Sax Infectious Diseases (pager) [phone] [email or web address] 03/07/2015, 10:49 AM  LOS: 4 days

## 2015-03-07 NOTE — Progress Notes (Signed)
Hypoglycemic event:  0451: CBG 64 Treatment: 4 Oz orange juice CBG recheck: 88

## 2015-03-07 NOTE — Progress Notes (Signed)
Filed Vitals:   03/06/15 1736 03/06/15 2147 03/07/15 0201 03/07/15 0558  BP: 137/69 152/63 150/64 148/64  Pulse: 70 81 80 78  Temp:  98.8 F (37.1 C) 98.6 F (37 C) 98.4 F (36.9 C)  TempSrc:  Oral Oral Oral  Resp:  17 17 16   Height:      Weight:      SpO2:  99% 100% 98%    BMET  Recent Labs  03/06/15 0530  NA 138  K 4.4  CL 103  CO2 24  GLUCOSE 50*  BUN 19  CREATININE 1.13  CALCIUM 9.3    Patient resting in bed, continues on IV antibiotics. Comfortable. Order for PICC placed yesterday, it's unclear why not yet in place. For home health antibody therapy.  Plan: Continue IV antibiotics.  Awaiting PICC.  Hewitt Shorts, MD 03/07/2015, 9:49 AM

## 2015-03-08 DIAGNOSIS — B964 Proteus (mirabilis) (morganii) as the cause of diseases classified elsewhere: Secondary | ICD-10-CM

## 2015-03-08 DIAGNOSIS — Z959 Presence of cardiac and vascular implant and graft, unspecified: Secondary | ICD-10-CM

## 2015-03-08 DIAGNOSIS — B952 Enterococcus as the cause of diseases classified elsewhere: Secondary | ICD-10-CM

## 2015-03-08 LAB — WOUND CULTURE

## 2015-03-08 LAB — GLUCOSE, CAPILLARY
GLUCOSE-CAPILLARY: 86 mg/dL (ref 65–99)
GLUCOSE-CAPILLARY: 119 mg/dL — AB (ref 65–99)
GLUCOSE-CAPILLARY: 134 mg/dL — AB (ref 65–99)
Glucose-Capillary: 118 mg/dL — ABNORMAL HIGH (ref 65–99)

## 2015-03-08 LAB — CULTURE, BLOOD (ROUTINE X 2)
CULTURE: NO GROWTH
CULTURE: NO GROWTH

## 2015-03-08 MED ORDER — SODIUM CHLORIDE 0.9 % IJ SOLN
10.0000 mL | Freq: Two times a day (BID) | INTRAMUSCULAR | Status: DC
  Administered 2015-03-09 (×2): 10 mL

## 2015-03-08 MED ORDER — SODIUM CHLORIDE 0.9 % IJ SOLN
10.0000 mL | INTRAMUSCULAR | Status: DC | PRN
Start: 2015-03-08 — End: 2015-03-09

## 2015-03-08 MED ORDER — SODIUM CHLORIDE 0.9 % IV SOLN
3.0000 g | Freq: Three times a day (TID) | INTRAVENOUS | Status: DC
  Administered 2015-03-08 – 2015-03-09 (×4): 3 g via INTRAVENOUS
  Filled 2015-03-08 (×6): qty 3

## 2015-03-08 MED ORDER — OXYCODONE-ACETAMINOPHEN 5-325 MG PO TABS: 1.0000 | ORAL_TABLET | ORAL | Status: DC | PRN

## 2015-03-08 MED ORDER — VANCOMYCIN HCL IN DEXTROSE 1-5 GM/200ML-% IV SOLN: 1000.0000 mg | INTRAVENOUS | Status: DC

## 2015-03-08 MED ORDER — DEXTROSE 5 % IV SOLN: 2.0000 g | INTRAVENOUS | Status: DC

## 2015-03-08 NOTE — Progress Notes (Signed)
Pharmacy Antibiotic Time-Out Note  [name] is a 74 y.o. year-old male admitted on 03/03/2015.  The patient is currently on ceftriaxone and vancomycin for an infected wound.  Assessment/Plan: Vancomycin and Ceftriaxone will be changed to Amp/Sulbactam based on cultures and/or susceptibilities.  The stop date will be 6 weeks of total antibiotics. Will Initiate 3 gm IV q8h based on previous renal function. Will also order a serum creatinine in AM to evaluate if renal fx is stable or dose needs to be adjusted.  Temp (24hrs), Avg:98.3 F (36.8 C), Min:97.7 F (36.5 C), Max:98.8 F (37.1 C)   Recent Labs Lab 03/03/15 1933  WBC 11.2*    Recent Labs Lab 03/03/15 1933 03/06/15 0530  CREATININE 1.75* 1.13   Estimated Creatinine Clearance: 60.1 mL/min (by C-G formula based on Cr of 1.13).     Antimicrobials this admission: CTX: 11/29 >>12/4 VANC: 11/29 >>12/4 Unasyn 12/4>>  Levels/dose changes this admission: 12/3: VT 16 mcg/ml on 1gm q24hrs.  Microbiology Results: 11/29 blood  x 2 - ng x 4 days so far 11/30 wound cx - Enterococcus amp s, Proteus s to unasyn  Thank you for allowing pharmacy to be a part of this patient's care.  [name] Bliss, PharmD, BCPS, [facility] Clinical Pharmacist Pager [phone] 03/08/2015 1:44 PM

## 2015-03-08 NOTE — Progress Notes (Signed)
No acute events PICC placed D/c home with vancomycin and ceftriaxone

## 2015-03-08 NOTE — Discharge Summary (Signed)
Date of Admission: 03/04/15  Date of Discharge: 03/08/15  Admission diagnosis: Wound dehiscence  Discharge diagnosis: Same  Procedure performed: Wound revision, washout  Hospital course: Patient was admitted for the above listed procedure.  Wound was found to grow GPCs.  ID consulted and recommended Vanc and ceftriaxone.  Patient had picc placed and is now ready for discharge.  Medications: Resume prior meds, vancomycin 1g IV q24, ceftriaxone 2g IV q24  Follow up: With Dr. Lovell Sheehan in 2 weeks

## 2015-03-08 NOTE — Progress Notes (Signed)
Peripherally Inserted Central Catheter/Midline Placement  The IV Nurse has discussed with the patient and/or persons authorized to consent for the patient, the purpose of this procedure and the potential benefits and risks involved with this procedure.  The benefits include less needle sticks, lab draws from the catheter and patient may be discharged home with the catheter.  Risks include, but not limited to, infection, bleeding, blood clot (thrombus formation), and puncture of an artery; nerve damage and irregular heat beat.  Alternatives to this procedure were also discussed.  PICC/Midline Placement Documentation  PICC Single Lumen 03/08/15 PICC Right Basilic 44 cm 0 cm (Active)  Indication for Insertion or Continuance of Line Home intravenous therapies (PICC only) 03/08/2015 11:06 AM  Exposed Catheter (cm) 0 cm 03/08/2015 11:06 AM  Site Assessment Clean;Dry;Intact 03/08/2015 11:06 AM  Line Status Flushed;Saline locked;Blood return noted 03/08/2015 11:06 AM  Dressing Type Transparent 03/08/2015 11:06 AM  Dressing Change Due 03/15/15 03/08/2015 11:06 AM       Ethelda Chick 03/08/2015, 11:07 AM

## 2015-03-08 NOTE — Progress Notes (Signed)
INFECTIOUS DISEASE PROGRESS NOTE  ID: [name] is a 74 y.o. male with  Active Problems:   Wound dehiscence  Subjective: Resting quietly  Abtx:  Anti-infectives    Start     Dose/Rate Route Frequency Ordered Stop   03/08/15 0000  cefTRIAXone 2 g in dextrose 5 % 50 mL     2 g 100 mL/hr over 30 Minutes Intravenous Every 24 hours 03/08/15 1114     03/08/15 0000  vancomycin (VANCOCIN) 1 GM/200ML SOLN     1,000 mg 200 mL/hr over 60 Minutes Intravenous Every 24 hours 03/08/15 1114     03/06/15 1800  cefTRIAXone (ROCEPHIN) 1 g in dextrose 5 % 50 mL IVPB  Status:  Discontinued     1 g 100 mL/hr over 30 Minutes Intravenous Every 24 hours 03/06/15 1050 03/06/15 1053   03/06/15 1800  cefTRIAXone (ROCEPHIN) 2 g in dextrose 5 % 50 mL IVPB     2 g 100 mL/hr over 30 Minutes Intravenous Every 24 hours 03/06/15 1053     03/04/15 2000  vancomycin (VANCOCIN) IVPB 1000 mg/200 mL premix     1,000 mg 200 mL/hr over 60 Minutes Intravenous Every 24 hours 03/03/15 2112     03/04/15 1854  vancomycin (VANCOCIN) powder  Status:  Discontinued       As needed 03/04/15 1854 03/04/15 1854   03/04/15 1853  bacitracin 50,000 Units in sodium chloride irrigation 0.9 % 500 mL irrigation  Status:  Discontinued       As needed 03/04/15 1853 03/04/15 1854   03/04/15 1827  vancomycin (VANCOCIN) 1000 MG powder    Comments:  Dorinda Hill   : cabinet override      03/04/15 1827 03/05/15 0629   03/03/15 1800  cefTRIAXone (ROCEPHIN) 2 g in dextrose 5 % 50 mL IVPB  Status:  Discontinued     2 g 100 mL/hr over 30 Minutes Intravenous Every 24 hours 03/03/15 1750 03/06/15 1050   03/03/15 1800  vancomycin (VANCOCIN) 1,500 mg in sodium chloride 0.9 % 500 mL IVPB     1,500 mg 250 mL/hr over 120 Minutes Intravenous  Once 03/03/15 1750 03/03/15 2207      Medications:  Scheduled: . acarbose  50 mg Oral TID WC  . aspirin EC  81 mg Oral Daily  . carvedilol  12.5 mg Oral BID WC  . cefTRIAXone (ROCEPHIN)  IV  2 g  Intravenous Q24H  . docusate sodium  100 mg Oral BID  . feeding supplement (ENSURE ENLIVE)  237 mL Oral BID BM  . fluticasone  2 spray Each Nare Daily  . furosemide  40 mg Oral Daily  . gabapentin  300 mg Oral TID  . glimepiride  2 mg Oral BID  . guaiFENesin  600 mg Oral BID  . hydrALAZINE  25 mg Oral 3 times per day  . isosorbide mononitrate  30 mg Oral Daily  . ivabradine  5 mg Oral BID WC  . metFORMIN  500 mg Oral BID WC  . multivitamin with minerals  1 tablet Oral Daily  . pravastatin  40 mg Oral QHS  . sodium chloride  10-40 mL Intracatheter Q12H  . vancomycin  1,000 mg Intravenous Q24H    Objective: Vital signs in last 24 hours: Temp:  [97.7 F (36.5 C)-98.8 F (37.1 C)] 98.3 F (36.8 C) (12/04 1323) Pulse Rate:  [64-87] 87 (12/04 1323) Resp:  [16-19] 18 (12/04 1323) BP: (115-135)/(53-65) 125/53 mmHg (12/04 1323) SpO2:  [  97 %-100 %] 100 % (12/04 1323)   General appearance: no distress  Lab Results  Recent Labs  03/06/15 0530  NA 138  K 4.4  CL 103  CO2 24  BUN 19  CREATININE 1.13   Liver Panel No results for input(s): PROT, ALBUMIN, AST, ALT, ALKPHOS, BILITOT, BILIDIR, IBILI in the last 72 hours. Sedimentation Rate No results for input(s): ESRSEDRATE in the last 72 hours. C-Reactive Protein No results for input(s): CRP in the last 72 hours.  Microbiology: Recent Results (from the past 240 hour(s))  Culture, blood (routine x 2)     Status: None (Preliminary result)   Collection Time: 03/03/15  7:33 PM  Result Value Ref Range Status   Specimen Description BLOOD RIGHT ANTECUBITAL  Final   Special Requests BOTTLES DRAWN AEROBIC AND ANAEROBIC 5CC  Final   Culture NO GROWTH 4 DAYS  Final   Report Status PENDING  Incomplete  Culture, blood (routine x 2)     Status: None (Preliminary result)   Collection Time: 03/03/15  7:42 PM  Result Value Ref Range Status   Specimen Description BLOOD LEFT ANTECUBITAL  Final   Special Requests BOTTLES DRAWN AEROBIC ONLY  5CC  Final   Culture NO GROWTH 4 DAYS  Final   Report Status PENDING  Incomplete  Wound culture     Status: None   Collection Time: 03/04/15  6:55 PM  Result Value Ref Range Status   Specimen Description WOUND BACK  Final   Special Requests PATIENT ON FOLLOWING ROCEPHIN  Final   Gram Stain   Final    ABUNDANT WBC PRESENT, PREDOMINANTLY PMN NO SQUAMOUS EPITHELIAL CELLS SEEN FEW GRAM POSITIVE COCCI IN PAIRS Performed at [facility]    Culture   Final    FEW PROTEUS MIRABILIS ABUNDANT ENTEROCOCCUS SPECIES Performed at [facility]    Report Status 03/08/2015 FINAL  Final   Organism ID, Bacteria PROTEUS MIRABILIS  Final   Organism ID, Bacteria ENTEROCOCCUS SPECIES  Final      Susceptibility   Proteus mirabilis - MIC*    AMPICILLIN <=2 SENSITIVE Sensitive     AMPICILLIN/SULBACTAM <=2 SENSITIVE Sensitive     CEFAZOLIN 8 RESISTANT Resistant     CEFEPIME <=1 SENSITIVE Sensitive     CEFTAZIDIME <=1 SENSITIVE Sensitive     CEFTRIAXONE <=1 SENSITIVE Sensitive     CIPROFLOXACIN <=0.25 SENSITIVE Sensitive     GENTAMICIN <=1 SENSITIVE Sensitive     IMIPENEM 2 INTERMEDIATE Intermediate     PIP/TAZO <=4 SENSITIVE Sensitive     TOBRAMYCIN <=1 SENSITIVE Sensitive     TRIMETH/SULFA Value in next row Sensitive      <=20 SENSITIVE(NOTE)    * FEW PROTEUS MIRABILIS   Enterococcus species - MIC*    VANCOMYCIN Value in next row Sensitive      <=20 SENSITIVE(NOTE)    AMPICILLIN Value in next row Sensitive      <=20 SENSITIVE(NOTE)    * ABUNDANT ENTEROCOCCUS SPECIES  Anaerobic culture     Status: None (Preliminary result)   Collection Time: 03/04/15  6:55 PM  Result Value Ref Range Status   Specimen Description WOUND BACK  Final   Special Requests PATIENT ON FOLLOWING ROCEPHIN  Final   Gram Stain   Final    MODERATE WBC PRESENT, PREDOMINANTLY PMN NO SQUAMOUS EPITHELIAL CELLS SEEN FEW GRAM POSITIVE COCCI IN PAIRS Performed at [facility]    Culture   Final     NO ANAEROBES ISOLATED;  CULTURE IN PROGRESS FOR 5 DAYS Performed at [facility]    Report Status PENDING  Incomplete    Studies/Results: No results found.   Assessment/Plan: Wound infection PIC placed His Cr reveal proteus and enterococcus.  Will change his anbx to unasyn.  Plan for 6 weeks.  He can f/u with me in ID clinic in 1 month.   DM2 One episode of low FSG, otherwise well controlled  CRI Normalized at last blood draw  Total days of antibiotics: 4 vanco/ceftriaxone         Johny Sax Infectious Diseases (pager) [phone] [email or web address] 03/08/2015, 1:31 PM  LOS: 5 days

## 2015-03-09 DIAGNOSIS — T814XXA Infection following a procedure, initial encounter: Secondary | ICD-10-CM | POA: Diagnosis not present

## 2015-03-09 DIAGNOSIS — Z4789 Encounter for other orthopedic aftercare: Secondary | ICD-10-CM | POA: Diagnosis not present

## 2015-03-09 DIAGNOSIS — T8130XA Disruption of wound, unspecified, initial encounter: Secondary | ICD-10-CM | POA: Diagnosis not present

## 2015-03-09 DIAGNOSIS — M479 Spondylosis, unspecified: Secondary | ICD-10-CM | POA: Diagnosis not present

## 2015-03-09 DIAGNOSIS — Z452 Encounter for adjustment and management of vascular access device: Secondary | ICD-10-CM | POA: Diagnosis not present

## 2015-03-09 DIAGNOSIS — G8250 Quadriplegia, unspecified: Secondary | ICD-10-CM | POA: Diagnosis not present

## 2015-03-09 DIAGNOSIS — Z792 Long term (current) use of antibiotics: Secondary | ICD-10-CM | POA: Diagnosis not present

## 2015-03-09 LAB — ANAEROBIC CULTURE

## 2015-03-09 LAB — CREATININE, SERUM
CREATININE: 1.41 mg/dL — AB (ref 0.61–1.24)
GFR calc Af Amer: 55 mL/min — ABNORMAL LOW (ref >60)
GFR calc non Af Amer: 48 mL/min — ABNORMAL LOW (ref >60)

## 2015-03-09 LAB — GLUCOSE, CAPILLARY
GLUCOSE-CAPILLARY: 71 mg/dL (ref 65–99)
GLUCOSE-CAPILLARY: 131 mg/dL — AB (ref 65–99)
GLUCOSE-CAPILLARY: 131 mg/dL — AB (ref 65–99)

## 2015-03-09 MED ORDER — HEPARIN SOD (PORK) LOCK FLUSH 100 UNIT/ML IV SOLN
250.0000 [IU] | INTRAVENOUS | Status: AC | PRN
Start: 2015-03-09 — End: 2015-03-09
  Administered 2015-03-09: 250 [IU]

## 2015-03-09 NOTE — Progress Notes (Signed)
Discharged to home via w/c. Will be followed by [facility]

## 2015-03-09 NOTE — Discharge Summary (Signed)
Physician Discharge Summary  Patient ID: [name] MRN: [id number] DOB/AGE: [birth year] 74 y.o.  Admit date: 03/03/2015 Discharge date: 03/09/2015  Admission Diagnoses: Wound infection  Discharge Diagnoses: The same Active Problems:   Wound dehiscence   Discharged Condition: fair  Hospital Course: I admitted the patient on 03/03/2015 with a diagnosis of a wound dehiscence/infection. I performed an incision and drainage of his wound on 03/04/2015. The cultures grew Proteus and enterococci. ID saw the patient and recommended 6 weeks of IV Unasyn. Arrangements were made for home IV antibiotics and home health care nurse. The patient was discharged home on 03/09/2015. The patient, and his wife, were instructed on IV therapies and dressing changes.  Consults: Infectious disease Significant Diagnostic Studies: Lumbar CT Treatments: Incision and drainage of wound Discharge Exam: Blood pressure 131/63, pulse 79, temperature 98.4 F (36.9 C), temperature source Oral, resp. rate 18, height  (1.727 m), weight 82.7 kg (182 lb 5.1 oz), SpO2 99 %. The patient is alert and pleasant. He is moving his lower extremities well. His dressing has some drainage on it.  Disposition: Home  Discharge Instructions    Call MD for:  difficulty breathing, headache or visual disturbances    Complete by:  As directed      Call MD for:  difficulty breathing, headache or visual disturbances    Complete by:  As directed      Call MD for:  extreme fatigue    Complete by:  As directed      Call MD for:  hives    Complete by:  As directed      Call MD for:  persistant dizziness or light-headedness    Complete by:  As directed      Call MD for:  persistant dizziness or light-headedness    Complete by:  As directed      Call MD for:  persistant nausea and vomiting    Complete by:  As directed      Call MD for:  persistant nausea and vomiting    Complete by:  As directed      Call MD for:  redness,  tenderness, or signs of infection (pain, swelling, redness, odor or green/yellow discharge around incision site)    Complete by:  As directed      Call MD for:  redness, tenderness, or signs of infection (pain, swelling, redness, odor or green/yellow discharge around incision site)    Complete by:  As directed      Call MD for:  severe uncontrolled pain    Complete by:  As directed      Call MD for:  severe uncontrolled pain    Complete by:  As directed      Call MD for:  temperature >100.4    Complete by:  As directed      Call MD for:  temperature >100.4    Complete by:  As directed      Change dressing (specify)    Complete by:  As directed   Dressing change: 2 times per day using 4 x 4 gauze sponges and tape.     Diet - low sodium heart healthy    Complete by:  As directed      Diet - low sodium heart healthy    Complete by:  As directed      Discharge instructions    Complete by:  As directed   Do not lift more than 10 pounds Keep incision clean and  dry     Discharge instructions    Complete by:  As directed   Call [phone] for a followup appointment. Take a stool softener while you are using pain medications.     Driving Restrictions    Complete by:  As directed   Do not drive for 2 weeks.     Increase activity slowly    Complete by:  As directed      Increase activity slowly    Complete by:  As directed      Lifting restrictions    Complete by:  As directed   Do not lift more than ten pounds     Lifting restrictions    Complete by:  As directed   Do not lift more than 5 pounds. No excessive bending or twisting.     May shower / Bathe    Complete by:  As directed   He may shower after the pain she is removed 3 days after surgery. Leave the incision alone.     No dressing needed    Complete by:  As directed             Medication List    TAKE these medications        acarbose 50 MG tablet  Commonly known as:  PRECOSE  Take 50 mg by mouth 3 (three) times  daily with meals. Take 1 tablet 1-3 times a day     aspirin EC 81 MG tablet  Take 1 tablet (81 mg total) by mouth daily.     carvedilol 12.5 MG tablet  Commonly known as:  COREG  Take 1 tablet (12.5 mg total) by mouth 2 (two) times daily with a meal.     cefTRIAXone 2 g in dextrose 5 % 50 mL  Inject 2 g into the vein daily.     cephALEXin 500 MG capsule  Commonly known as:  KEFLEX  Take 500 mg by mouth 4 (four) times daily.     cyclobenzaprine 5 MG tablet  Commonly known as:  FLEXERIL  Take 1 tablet (5 mg total) by mouth 3 (three) times daily as needed for muscle spasms.     docusate sodium 100 MG capsule  Commonly known as:  COLACE  Take 300 mg by mouth 2 (two) times daily.     fluticasone 50 MCG/ACT nasal spray  Commonly known as:  FLONASE  Place 2 sprays into the nose daily as needed.     furosemide 40 MG tablet  Commonly known as:  LASIX  Take 40 mg by mouth daily.     gabapentin 300 MG capsule  Commonly known as:  NEURONTIN  Take 300 mg by mouth 3 (three) times daily.     glimepiride 2 MG tablet  Commonly known as:  AMARYL  Take 2 mg by mouth 2 (two) times daily.     guaiFENesin 600 MG 12 hr tablet  Commonly known as:  MUCINEX  Take 600 mg by mouth 2 (two) times daily as needed.     hydrALAZINE 25 MG tablet  Commonly known as:  APRESOLINE  TAKE ONE-HALF TABLET BY MOUTH THREE TIMES DAILY     isosorbide mononitrate 30 MG 24 hr tablet  Commonly known as:  IMDUR  TAKE ONE TABLET BY MOUTH ONCE DAILY     ivabradine 5 MG Tabs tablet  Commonly known as:  CORLANOR  Take 1 tablet (5 mg total) by mouth 2 (two) times daily with a meal.  magnesium hydroxide 400 MG/5ML suspension  Commonly known as:  MILK OF MAGNESIA  Take 30 mL by mouth daily as needed for constipation.     metFORMIN 500 MG tablet  Commonly known as:  GLUCOPHAGE  Take 500 mg by mouth 2 (two) times daily with a meal.     oxyCODONE-acetaminophen 5-325 MG tablet  Commonly known as:   PERCOCET/ROXICET  Take 1-2 tablets by mouth every 4 (four) hours as needed for moderate pain.     phenylephrine 0.25 % nasal spray  Commonly known as:  NEO-SYNEPHRINE  Place 1 spray into both nostrils every 6 (six) hours as needed for congestion.     pravastatin 40 MG tablet  Commonly known as:  PRAVACHOL  Take 1 tablet (40 mg total) by mouth at bedtime.     silver sulfADIAZINE 1 % cream  Commonly known as:  SILVADENE  Apply 1 application topically. For infection of the skin     vancomycin 1 GM/200ML Soln  Commonly known as:  VANCOCIN  Inject 200 mLs (1,000 mg total) into the vein daily.           Follow-up Information    Follow up with [facility].   Why:  Home Health RN and PT for therapy and IV antibiotics   Contact information:   [phone]      Signed: Cristi Loron 03/09/2015, 3:49 PM

## 2015-03-09 NOTE — Care Management Note (Signed)
Case Management Note  Patient Details  Name: HOSKIE DELOE MRN: [id number] Date of Birth: April 25, 1940  Subjective/Objective:                    Action/Plan: Plan to discharge home today with home IV antibiotics and PT. [facility] aware of patients discharge today and notified of addition of PT. Dr Lovell Sheehan aware of antibiotic change for home and has ordered the recommended Unasyn. No further needs per CM.   Expected Discharge Date:                  Expected Discharge Plan:  Home w Home Health Services  In-House Referral:     Discharge planning Services  CM Consult  Post Acute Care Choice:  Home Health Choice offered to:  Patient, Spouse  DME Arranged:    DME Agency:     HH Arranged:  RN, PT HH Agency:  [facility]  Status of Service:  Completed, signed off  Medicare Important Message Given:  Yes Date Medicare IM Given:    Medicare IM give by:    Date Additional Medicare IM Given:    Additional Medicare Important Message give by:     If discussed at Long Length of Stay Meetings, dates discussed:    Additional Comments:  Kermit Balo, RN 03/09/2015, 11:47 AM

## 2015-03-09 NOTE — Care Management Important Message (Signed)
Important Message  Patient Details  Name: [name] MRN: [id number] Date of Birth: [birth year]   Medicare Important Message Given:  Yes    [name] P Leonor Darnell 03/09/2015, 11:50 AM

## 2015-03-09 NOTE — Progress Notes (Signed)
D/c to home via w/c. PICC line in place and capped by IV team. Followed bu [facility]

## 2015-03-10 DIAGNOSIS — Z452 Encounter for adjustment and management of vascular access device: Secondary | ICD-10-CM | POA: Diagnosis not present

## 2015-03-10 DIAGNOSIS — Z792 Long term (current) use of antibiotics: Secondary | ICD-10-CM | POA: Diagnosis not present

## 2015-03-11 DIAGNOSIS — D649 Anemia, unspecified: Secondary | ICD-10-CM | POA: Diagnosis not present

## 2015-03-11 DIAGNOSIS — T8131XA Disruption of external operation (surgical) wound, not elsewhere classified, initial encounter: Secondary | ICD-10-CM | POA: Diagnosis not present

## 2015-03-11 DIAGNOSIS — Z792 Long term (current) use of antibiotics: Secondary | ICD-10-CM | POA: Diagnosis not present

## 2015-03-11 DIAGNOSIS — Z5181 Encounter for therapeutic drug level monitoring: Secondary | ICD-10-CM | POA: Diagnosis not present

## 2015-03-11 DIAGNOSIS — Z452 Encounter for adjustment and management of vascular access device: Secondary | ICD-10-CM | POA: Diagnosis not present

## 2015-03-11 DIAGNOSIS — Z7901 Long term (current) use of anticoagulants: Secondary | ICD-10-CM | POA: Diagnosis not present

## 2015-03-11 DIAGNOSIS — K219 Gastro-esophageal reflux disease without esophagitis: Secondary | ICD-10-CM | POA: Diagnosis not present

## 2015-03-11 DIAGNOSIS — F329 Major depressive disorder, single episode, unspecified: Secondary | ICD-10-CM | POA: Diagnosis not present

## 2015-03-11 DIAGNOSIS — N183 Chronic kidney disease, stage 3 (moderate): Secondary | ICD-10-CM | POA: Diagnosis not present

## 2015-03-11 DIAGNOSIS — I130 Hypertensive heart and chronic kidney disease with heart failure and stage 1 through stage 4 chronic kidney disease, or unspecified chronic kidney disease: Secondary | ICD-10-CM | POA: Diagnosis not present

## 2015-03-11 DIAGNOSIS — I5022 Chronic systolic (congestive) heart failure: Secondary | ICD-10-CM | POA: Diagnosis not present

## 2015-03-11 DIAGNOSIS — M4806 Spinal stenosis, lumbar region: Secondary | ICD-10-CM | POA: Diagnosis not present

## 2015-03-11 DIAGNOSIS — G8929 Other chronic pain: Secondary | ICD-10-CM | POA: Diagnosis not present

## 2015-03-11 DIAGNOSIS — E1122 Type 2 diabetes mellitus with diabetic chronic kidney disease: Secondary | ICD-10-CM | POA: Diagnosis not present

## 2015-03-12 ENCOUNTER — Emergency Department (HOSPITAL_COMMUNITY)
Admission: EM | Admit: 2015-03-12 | Discharge: 2015-03-13 | Disposition: A | Discharge status: Home / Self Care | Payer: Medicare Other | Attending: Emergency Medicine | Admitting: Emergency Medicine

## 2015-03-12 ENCOUNTER — Encounter (HOSPITAL_COMMUNITY): Payer: Self-pay | Admitting: Emergency Medicine

## 2015-03-12 DIAGNOSIS — T8131XA Disruption of external operation (surgical) wound, not elsewhere classified, initial encounter: Secondary | ICD-10-CM | POA: Diagnosis not present

## 2015-03-12 DIAGNOSIS — G8929 Other chronic pain: Secondary | ICD-10-CM | POA: Diagnosis not present

## 2015-03-12 DIAGNOSIS — I252 Old myocardial infarction: Secondary | ICD-10-CM | POA: Diagnosis not present

## 2015-03-12 DIAGNOSIS — F329 Major depressive disorder, single episode, unspecified: Secondary | ICD-10-CM | POA: Insufficient documentation

## 2015-03-12 DIAGNOSIS — I5022 Chronic systolic (congestive) heart failure: Secondary | ICD-10-CM | POA: Insufficient documentation

## 2015-03-12 DIAGNOSIS — Z792 Long term (current) use of antibiotics: Secondary | ICD-10-CM | POA: Insufficient documentation

## 2015-03-12 DIAGNOSIS — M1990 Unspecified osteoarthritis, unspecified site: Secondary | ICD-10-CM | POA: Diagnosis not present

## 2015-03-12 DIAGNOSIS — T8130XD Disruption of wound, unspecified, subsequent encounter: Secondary | ICD-10-CM | POA: Diagnosis not present

## 2015-03-12 DIAGNOSIS — E785 Hyperlipidemia, unspecified: Secondary | ICD-10-CM | POA: Insufficient documentation

## 2015-03-12 DIAGNOSIS — Z7984 Long term (current) use of oral hypoglycemic drugs: Secondary | ICD-10-CM | POA: Diagnosis not present

## 2015-03-12 DIAGNOSIS — F419 Anxiety disorder, unspecified: Secondary | ICD-10-CM | POA: Diagnosis not present

## 2015-03-12 DIAGNOSIS — E119 Type 2 diabetes mellitus without complications: Secondary | ICD-10-CM | POA: Diagnosis not present

## 2015-03-12 DIAGNOSIS — Z5181 Encounter for therapeutic drug level monitoring: Secondary | ICD-10-CM | POA: Diagnosis not present

## 2015-03-12 DIAGNOSIS — N183 Chronic kidney disease, stage 3 (moderate): Secondary | ICD-10-CM | POA: Insufficient documentation

## 2015-03-12 DIAGNOSIS — I129 Hypertensive chronic kidney disease with stage 1 through stage 4 chronic kidney disease, or unspecified chronic kidney disease: Secondary | ICD-10-CM | POA: Diagnosis not present

## 2015-03-12 DIAGNOSIS — Z9889 Other specified postprocedural states: Secondary | ICD-10-CM | POA: Insufficient documentation

## 2015-03-12 DIAGNOSIS — Y658 Other specified misadventures during surgical and medical care: Secondary | ICD-10-CM | POA: Insufficient documentation

## 2015-03-12 DIAGNOSIS — T8131XD Disruption of external operation (surgical) wound, not elsewhere classified, subsequent encounter: Secondary | ICD-10-CM | POA: Diagnosis not present

## 2015-03-12 DIAGNOSIS — K219 Gastro-esophageal reflux disease without esophagitis: Secondary | ICD-10-CM | POA: Insufficient documentation

## 2015-03-12 DIAGNOSIS — M4806 Spinal stenosis, lumbar region: Secondary | ICD-10-CM | POA: Diagnosis not present

## 2015-03-12 DIAGNOSIS — Z8701 Personal history of pneumonia (recurrent): Secondary | ICD-10-CM | POA: Insufficient documentation

## 2015-03-12 DIAGNOSIS — I2510 Atherosclerotic heart disease of native coronary artery without angina pectoris: Secondary | ICD-10-CM | POA: Insufficient documentation

## 2015-03-12 DIAGNOSIS — D649 Anemia, unspecified: Secondary | ICD-10-CM | POA: Diagnosis not present

## 2015-03-12 DIAGNOSIS — E1122 Type 2 diabetes mellitus with diabetic chronic kidney disease: Secondary | ICD-10-CM | POA: Diagnosis not present

## 2015-03-12 DIAGNOSIS — Z79899 Other long term (current) drug therapy: Secondary | ICD-10-CM | POA: Insufficient documentation

## 2015-03-12 DIAGNOSIS — Z7982 Long term (current) use of aspirin: Secondary | ICD-10-CM | POA: Diagnosis not present

## 2015-03-12 DIAGNOSIS — Z86718 Personal history of other venous thrombosis and embolism: Secondary | ICD-10-CM | POA: Insufficient documentation

## 2015-03-12 DIAGNOSIS — Z452 Encounter for adjustment and management of vascular access device: Secondary | ICD-10-CM | POA: Diagnosis not present

## 2015-03-12 DIAGNOSIS — Z7901 Long term (current) use of anticoagulants: Secondary | ICD-10-CM | POA: Diagnosis not present

## 2015-03-12 DIAGNOSIS — I130 Hypertensive heart and chronic kidney disease with heart failure and stage 1 through stage 4 chronic kidney disease, or unspecified chronic kidney disease: Secondary | ICD-10-CM | POA: Diagnosis not present

## 2015-03-12 DIAGNOSIS — Z862 Personal history of diseases of the blood and blood-forming organs and certain disorders involving the immune mechanism: Secondary | ICD-10-CM | POA: Insufficient documentation

## 2015-03-12 DIAGNOSIS — Z8669 Personal history of other diseases of the nervous system and sense organs: Secondary | ICD-10-CM | POA: Insufficient documentation

## 2015-03-12 LAB — BASIC METABOLIC PANEL
Anion gap: 11 (ref 5–15)
BUN: 25 mg/dL — ABNORMAL HIGH (ref 6–20)
CO2: 27 mmol/L (ref 22–32)
Calcium: 9.9 mg/dL (ref 8.9–10.3)
Chloride: 100 mmol/L — ABNORMAL LOW (ref 101–111)
Creatinine, Ser: 1.78 mg/dL — ABNORMAL HIGH (ref 0.61–1.24)
GFR calc Af Amer: 42 mL/min — ABNORMAL LOW (ref >60)
GFR calc non Af Amer: 36 mL/min — ABNORMAL LOW (ref >60)
Glucose, Bld: 122 mg/dL — ABNORMAL HIGH (ref 65–99)
Potassium: 4.4 mmol/L (ref 3.5–5.1)
Sodium: 138 mmol/L (ref 135–145)

## 2015-03-12 LAB — CBC
HCT: 30.6 % — ABNORMAL LOW (ref 39.0–52.0)
Hemoglobin: 9.9 g/dL — ABNORMAL LOW (ref 13.0–17.0)
MCH: 30 pg (ref 26.0–34.0)
MCHC: 32.4 g/dL (ref 30.0–36.0)
MCV: 92.7 fL (ref 78.0–100.0)
Platelets: 323 10*3/uL (ref 150–400)
RBC: 3.3 MIL/uL — ABNORMAL LOW (ref 4.22–5.81)
RDW: 14.1 % (ref 11.5–15.5)
WBC: 10.4 10*3/uL (ref 4.0–10.5)

## 2015-03-12 LAB — CBG MONITORING, ED: Glucose-Capillary: 59 mg/dL — ABNORMAL LOW (ref 65–99)

## 2015-03-12 MED ORDER — SODIUM CHLORIDE 0.9 % IV SOLN
3.0000 g | Freq: Three times a day (TID) | INTRAVENOUS | Status: DC
  Administered 2015-03-12 – 2015-03-13 (×3): 3 g via INTRAVENOUS
  Filled 2015-03-12 (×7): qty 3

## 2015-03-12 MED ORDER — ASPIRIN EC 81 MG PO TBEC
81.0000 mg | DELAYED_RELEASE_TABLET | Freq: Every day | ORAL | Status: DC
Start: 2015-03-13 — End: 2015-03-13
  Administered 2015-03-13: 81 mg via ORAL
  Filled 2015-03-12: qty 1

## 2015-03-12 MED ORDER — METFORMIN HCL 500 MG PO TABS
500.0000 mg | ORAL_TABLET | Freq: Two times a day (BID) | ORAL | Status: DC
  Administered 2015-03-13: 500 mg via ORAL
  Filled 2015-03-12 (×2): qty 1

## 2015-03-12 MED ORDER — PRAVASTATIN SODIUM 40 MG PO TABS
40.0000 mg | ORAL_TABLET | Freq: Every day | ORAL | Status: DC
  Administered 2015-03-12: 40 mg via ORAL
  Filled 2015-03-12 (×2): qty 1

## 2015-03-12 MED ORDER — CARVEDILOL 12.5 MG PO TABS
12.5000 mg | ORAL_TABLET | Freq: Two times a day (BID) | ORAL | Status: DC
  Administered 2015-03-13: 12.5 mg via ORAL
  Filled 2015-03-12 (×3): qty 1

## 2015-03-12 MED ORDER — SILVER SULFADIAZINE 1 % EX CREA
1.0000 "application " | TOPICAL_CREAM | Freq: Every day | CUTANEOUS | Status: DC
  Administered 2015-03-13: 1 via TOPICAL
  Filled 2015-03-12: qty 85

## 2015-03-12 MED ORDER — GLIMEPIRIDE 2 MG PO TABS
2.0000 mg | ORAL_TABLET | Freq: Two times a day (BID) | ORAL | Status: DC
  Administered 2015-03-13: 2 mg via ORAL
  Filled 2015-03-12 (×3): qty 1

## 2015-03-12 MED ORDER — IVABRADINE HCL 5 MG PO TABS
5.0000 mg | ORAL_TABLET | Freq: Two times a day (BID) | ORAL | Status: DC
  Administered 2015-03-13 (×2): 5 mg via ORAL
  Filled 2015-03-12 (×4): qty 1

## 2015-03-12 MED ORDER — GUAIFENESIN ER 600 MG PO TB12
600.0000 mg | ORAL_TABLET | Freq: Two times a day (BID) | ORAL | Status: DC | PRN
  Filled 2015-03-12: qty 1

## 2015-03-12 MED ORDER — FLUTICASONE PROPIONATE 50 MCG/ACT NA SUSP: 2.0000 | Freq: Every day | NASAL | Status: DC | PRN

## 2015-03-12 MED ORDER — FUROSEMIDE 20 MG PO TABS
40.0000 mg | ORAL_TABLET | Freq: Every day | ORAL | Status: DC
  Administered 2015-03-13: 40 mg via ORAL
  Filled 2015-03-12 (×2): qty 2

## 2015-03-12 MED ORDER — DOCUSATE SODIUM 100 MG PO CAPS
300.0000 mg | ORAL_CAPSULE | Freq: Two times a day (BID) | ORAL | Status: DC
  Administered 2015-03-12 – 2015-03-13 (×2): 300 mg via ORAL
  Filled 2015-03-12 (×2): qty 3

## 2015-03-12 MED ORDER — ACARBOSE 50 MG PO TABS
50.0000 mg | ORAL_TABLET | Freq: Three times a day (TID) | ORAL | Status: DC
  Administered 2015-03-13 (×2): 50 mg via ORAL
  Filled 2015-03-12 (×4): qty 1

## 2015-03-12 MED ORDER — DEXTROSE 5 % IV SOLN: 2.0000 g | INTRAVENOUS | Status: DC

## 2015-03-12 MED ORDER — ISOSORBIDE MONONITRATE ER 30 MG PO TB24
30.0000 mg | ORAL_TABLET | Freq: Every day | ORAL | Status: DC
Start: 2015-03-13 — End: 2015-03-13
  Administered 2015-03-13: 30 mg via ORAL
  Filled 2015-03-12 (×2): qty 1

## 2015-03-12 MED ORDER — CYCLOBENZAPRINE HCL 10 MG PO TABS
5.0000 mg | ORAL_TABLET | Freq: Three times a day (TID) | ORAL | Status: DC | PRN
  Administered 2015-03-12: 5 mg via ORAL
  Filled 2015-03-12: qty 1

## 2015-03-12 MED ORDER — OXYCODONE-ACETAMINOPHEN 5-325 MG PO TABS
1.0000 | ORAL_TABLET | ORAL | Status: DC | PRN
  Administered 2015-03-12: 1 via ORAL
  Filled 2015-03-12: qty 2

## 2015-03-12 MED ORDER — GABAPENTIN 300 MG PO CAPS
300.0000 mg | ORAL_CAPSULE | Freq: Three times a day (TID) | ORAL | Status: DC
  Administered 2015-03-12 – 2015-03-13 (×3): 300 mg via ORAL
  Filled 2015-03-12 (×3): qty 1

## 2015-03-12 MED ORDER — HYDRALAZINE HCL 25 MG PO TABS
12.5000 mg | ORAL_TABLET | Freq: Three times a day (TID) | ORAL | Status: DC
  Administered 2015-03-13 (×2): 12.5 mg via ORAL
  Filled 2015-03-12 (×4): qty 0.5

## 2015-03-12 MED ORDER — VANCOMYCIN HCL IN DEXTROSE 1-5 GM/200ML-% IV SOLN: 1000.0000 mg | INTRAVENOUS | Status: DC

## 2015-03-12 NOTE — ED Provider Notes (Signed)
CSN: [id number]     Arrival date & time 03/12/15  Aug 03, 2055 History   First MD Initiated Contact with Patient 03/12/15 Aug 02, 2205     Chief Complaint  Patient presents with  . Referral     (Consider location/radiation/quality/duration/timing/severity/associated sxs/prior Treatment) HPI   74 year old male presented to the emergency room for essentially what sounds like placement in a nursing/rehab facility. Patient had a lumbar laminectomy on 10/31. He was readmitted on 11/29 with wound dehiscence/infection. He had incision and drainage on 11/30. He was discharged from the hospital on 12/5 with continued Unasyn via PICC. Home health nursing arrangements were made. Patient's wife was assisting in care as well. Pt's wife is now sick with GI illness and having difficulty taking care of him. Reports pain has been improving. No fever or chills.     Past Medical History  Diagnosis Date  . Hypertension   . Hyperlipidemia   . Cervical spinal stenosis   . Lumbar spinal stenosis   . 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 DVT (deep venous thrombosis) (HCC) 10/2011    RUE; pt denies this hx on 2013-05-17  . Anemia   . Cardiomyopathy (HCC)     a. 10/2012 Echo: EF 20-25%, diff HK, mod dil LA/RV/RA.  Marland Kitchen CAD (coronary artery disease)   . Atrial flutter (HCC)   . CKD (chronic kidney disease), stage III   . Venous insufficiency   . Frequent falls   . 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 Chronic systolic congestive heart failure (HCC)   . Quadriparesis (HCC) 08-03-11    pre op  . Hypoglycemia   . Cervical myelopathy (HCC)   . Acute encephalopathy   . Mononeuritis of unspecified site   . Dysrhythmia   . H/O hiatal hernia   . Arthritis     "all over" (2013/05/17)  . Depression     "son died in service in 08-03-90; still bothers me" (05/17/2013)  . AICD (automatic cardioverter/defibrillator) present   . Shortness of breath dyspnea     with exertion  . Pneumonia 1960   , also 2014  . Type II diabetes mellitus (HCC)     type 2  . GERD (gastroesophageal reflux disease)   . Myocardial infarction [facility])    Past Surgical History  Procedure Laterality Date  . Total hip arthroplasty Right ~ 08-03-10  . Toe amputation Left 12/2001; 06/2010    "2 toes" (May 17, 2013)  . Anterior cervical decomp/discectomy fusion  10/31/2011    Procedure: ANTERIOR CERVICAL DECOMPRESSION/DISCECTOMY FUSION 2 LEVELS;  Surgeon: Cristi Loron, MD;  Location: [facility];  Service: Neurosurgery;  Laterality: N/A;  Cervical Four-Five Cervical Five-Six Anterior Cervical Decompression with fusion interbody prothesis plating and bonegraft  . Carpal tunnel release Right ~ 08-03-2011  . Bi-ventricular implantable cardioverter defibrillator  (crt-d) Left 05-17-13    STJ CRTD implanted by Dr Ladona Ridgel for primary prevention/CHF  . Cataract extraction w/ intraocular lens implant Bilateral   . Cardiac catheterization  10/2012  . Left and right heart catheterization with coronary angiogram N/A 11/06/2012    Procedure: LEFT AND RIGHT HEART CATHETERIZATION WITH CORONARY ANGIOGRAM;  Surgeon: Laurey Morale, MD;  Location: [facility] CATH LAB;  Service: Cardiovascular;  Laterality: N/A;  . Bi-ventricular implantable cardioverter defibrillator N/A 05/17/13    Procedure: BI-VENTRICULAR IMPLANTABLE CARDIOVERTER DEFIBRILLATOR  (CRT-D);  Surgeon: Marinus Maw, MD;  Location: [facility] CATH LAB;  Service: Cardiovascular;  Laterality: N/A;  . Joint replacement  2013  . Anterior cervical decomp/discectomy fusion N/A 04/17/2014    Procedure: CERVICAL THREE TO FOUR ANTERIOR CERVICAL DECOMPRESSION/DISCECTOMY FUSION 1 LEVEL/HARDWARE REMOVAL;  Surgeon: Tressie Stalker, MD;  Location: [facility];  Service: Neurosurgery;  Laterality: N/A;  C34 anterior cervical decompression with fusion interbody prosthesis plating and bonegraft with exploration of prev fusion and possible removal of old hardware  . Toe removal  Left     3rd and 4th toe  removed  . Lumbar laminectomy/decompression microdiscectomy N/A 02/02/2015    Procedure: LUMBAR LAMINECTOMY/DECOMPRESSION MICRODISCECTOMY 2 LEVELS;  Surgeon: Tressie Stalker, MD;  Location: [facility];  Service: Neurosurgery;  Laterality: N/A;  L34 L45 laminectomy and foraminotomy  . Lumbar wound debridement N/A 03/04/2015    Procedure: Incision and Drainage of LUMBAR WOUND ;  Surgeon: Tressie Stalker, MD;  Location: [facility];  Service: Neurosurgery;  Laterality: N/A;   Family History  Problem Relation Age of Onset  . Diabetes Brother   . Cancer Father     Lung cancer  . Heart attack Mother     pt thinks she had MI  . Cancer Mother    Social History  Substance Use Topics  . Smoking status: Never Smoker   . Smokeless tobacco: Never Used  . Alcohol Use: No     Comment: 05/16/2013 "used to drink a little bit a long time ago; nothing in over 30 yrs"    Review of Systems    Allergies  Acyclovir and related; Dristan cold; and Prednisone  Home Medications   Prior to Admission medications   Medication Sig Start Date End Date Taking? Authorizing Provider  acarbose (PRECOSE) 50 MG tablet Take 50 mg by mouth 3 (three) times daily with meals. Take 1 tablet 1-3 times a day    Historical Provider, MD  aspirin EC 81 MG tablet Take 1 tablet (81 mg total) by mouth daily. 12/16/14   Laurey Morale, MD  carvedilol (COREG) 12.5 MG tablet Take 1 tablet (12.5 mg total) by mouth 2 (two) times daily with a meal. 04/14/14   Amy D Clegg, NP  cefTRIAXone 2 g in dextrose 5 % 50 mL Inject 2 g into the vein daily. 03/08/15   Loura Halt Ditty, MD  cephALEXin (KEFLEX) 500 MG capsule Take 500 mg by mouth 4 (four) times daily. 02/27/15   Historical Provider, MD  cyclobenzaprine (FLEXERIL) 5 MG tablet Take 1 tablet (5 mg total) by mouth 3 (three) times daily as needed for muscle spasms. 02/05/15   Tressie Stalker, MD  docusate sodium (COLACE) 100 MG capsule Take 300 mg by mouth 2 (two) times daily.      Historical Provider, MD  fluticasone (FLONASE) 50 MCG/ACT nasal spray Place 2 sprays into the nose daily as needed.     Historical Provider, MD  furosemide (LASIX) 40 MG tablet Take 40 mg by mouth daily.     Historical Provider, MD  gabapentin (NEURONTIN) 300 MG capsule Take 300 mg by mouth 3 (three) times daily. 11/09/12   Christiane Ha, MD  glimepiride (AMARYL) 2 MG tablet Take 2 mg by mouth 2 (two) times daily.    Historical Provider, MD  guaiFENesin (MUCINEX) 600 MG 12 hr tablet Take 600 mg by mouth 2 (two) times daily as needed.     Historical Provider, MD  hydrALAZINE (APRESOLINE) 25 MG tablet TAKE ONE-HALF TABLET BY MOUTH THREE TIMES DAILY 10/30/14   Dolores Patty, MD  isosorbide mononitrate (IMDUR) 30 MG 24 hr tablet TAKE  ONE TABLET BY MOUTH ONCE DAILY 11/25/13   Dolores Patty, MD  ivabradine HCl (CORLANOR) 5 MG TABS tablet Take 1 tablet (5 mg total) by mouth 2 (two) times daily with a meal. 01/06/14   Laurey Morale, MD  magnesium hydroxide (MILK OF MAGNESIA) 400 MG/5ML suspension Take 30 mL by mouth daily as needed for constipation.    Historical Provider, MD  metFORMIN (GLUCOPHAGE) 500 MG tablet Take 500 mg by mouth 2 (two) times daily with a meal.     Historical Provider, MD  oxyCODONE-acetaminophen (PERCOCET/ROXICET) 5-325 MG tablet Take 1-2 tablets by mouth every 4 (four) hours as needed for moderate pain. 03/08/15   Loura Halt Ditty, MD  phenylephrine (NEO-SYNEPHRINE) 0.25 % nasal spray Place 1 spray into both nostrils every 6 (six) hours as needed for congestion.    Historical Provider, MD  pravastatin (PRAVACHOL) 40 MG tablet Take 1 tablet (40 mg total) by mouth at bedtime. 01/31/13   Sharon Seller, NP  silver sulfADIAZINE (SILVADENE) 1 % cream Apply 1 application topically. For infection of the skin 08/07/14   Historical Provider, MD  vancomycin (VANCOCIN) 1 GM/200ML SOLN Inject 200 mLs (1,000 mg total) into the vein daily. 03/08/15   Loura Halt Ditty, MD   BP  104/68 mmHg  Pulse 83  Temp(Src) 98.2 F (36.8 C) (Oral)  Resp 16  Ht  (1.753 m)  Wt 182 lb (82.555 kg)  BMI 26.86 kg/m2  SpO2 99% Physical Exam  Constitutional: He appears well-developed and well-nourished. No distress.  HENT:  Head: Normocephalic and atraumatic.  Eyes: Conjunctivae are normal. Right eye exhibits no discharge. Left eye exhibits no discharge.  Neck: Neck supple.  Cardiovascular: Normal rate, regular rhythm and normal heart sounds.  Exam reveals no gallop and no friction rub.   No murmur heard. R arm PICC  Pulmonary/Chest: Effort normal and breath sounds normal. No respiratory distress.  Abdominal: Soft. He exhibits no distension. There is no tenderness.  Musculoskeletal: He exhibits no edema or tenderness.  Midline lumbar wound with minimal serous appearing drainage. No surrounding cellulitis.   Neurological: He is alert.  Skin: Skin is warm and dry.  Psychiatric: He has a normal mood and affect. His behavior is normal. Thought content normal.  Nursing note and vitals reviewed.   ED Course  Procedures (including critical care time) Labs Review Labs Reviewed  BASIC METABOLIC PANEL - Abnormal; Notable for the following:    Chloride 100 (*)    Glucose, Bld 122 (*)    BUN 25 (*)    Creatinine, Ser 1.78 (*)    GFR calc non Af Amer 36 (*)    GFR calc Af Amer 42 (*)    All other components within normal limits  CBC - Abnormal; Notable for the following:    RBC 3.30 (*)    Hemoglobin 9.9 (*)    HCT 30.6 (*)    All other components within normal limits  URINALYSIS, ROUTINE W REFLEX MICROSCOPIC (NOT AT [facility])    Imaging Review No results found. I have personally reviewed and evaluated these images and lab results as part of my medical decision-making.   EKG Interpretation None      MDM   Final diagnoses:  Wound dehiscence, subsequent encounter    74 year old male with recent surgical site infection at home with the need for continued wound  care and prolonged antibiotics. Apparently patient's wife is a caregiver and unable to provide needed assistance with her recent illness.  Apparently when home health agency went to check on him today, informed that he had not been getting abx for the past two days. Will keep in ED overnight. Home meds ordered. Social work in morning.     Raeford Razor, MD 03/12/15 2322

## 2015-03-12 NOTE — Care Management (Signed)
Patient presented to [facility] ED via EMS stating he was told to come to the ED to be admitted to [facility]. Patient was inpatient on 16M, discharged on 12/5.   ED CM met with patient at bedside he reports he was receiving [facility] IV antibiotic with [facility], wife caregiver  became ill, unable to participate in care. CM spoke with Lorretta [phone] [facility] RN who states, they received an order from Dr. Cam Hai office to admit patient to [facility] today.  [facility] has not initiated placement process. ED CSW be consulted to  follow up in the am with safe discharge plan.  Updated Dr. Wilson Singer and [facility] RN on Jason D.

## 2015-03-12 NOTE — ED Notes (Signed)
Margaretha Glassing from [facility] called back and Dr Lovell Sheehan to fill out an [facility] for emergency SNF placement.  Burna Mortimer, RN involved for placement and speaking with [facility].

## 2015-03-12 NOTE — ED Notes (Addendum)
Pt states he is supposed to be a direct admit. Dr. Lovell Sheehan office called and page sent to on call provider to verify because no order can be be found.

## 2015-03-12 NOTE — ED Notes (Signed)
Pt states he was told by Dr.jenkins office to come to [facility] from a direct admission until he could [facility] into [facility]. Pt has no complaints at this time. Pt states his wife is just unable to take care of him at home and he is getting IV antibiotics at home every 4 hours via his PICC line after spinal surgery.

## 2015-03-12 NOTE — ED Notes (Addendum)
Phone call to [facility] at this time.  RN to return phone call for info on patient.

## 2015-03-13 DIAGNOSIS — Z8701 Personal history of pneumonia (recurrent): Secondary | ICD-10-CM | POA: Diagnosis not present

## 2015-03-13 DIAGNOSIS — E119 Type 2 diabetes mellitus without complications: Secondary | ICD-10-CM | POA: Diagnosis not present

## 2015-03-13 DIAGNOSIS — Z9889 Other specified postprocedural states: Secondary | ICD-10-CM | POA: Diagnosis not present

## 2015-03-13 DIAGNOSIS — N183 Chronic kidney disease, stage 3 (moderate): Secondary | ICD-10-CM | POA: Diagnosis not present

## 2015-03-13 DIAGNOSIS — I5020 Unspecified systolic (congestive) heart failure: Secondary | ICD-10-CM | POA: Diagnosis not present

## 2015-03-13 DIAGNOSIS — E1122 Type 2 diabetes mellitus with diabetic chronic kidney disease: Secondary | ICD-10-CM | POA: Diagnosis not present

## 2015-03-13 DIAGNOSIS — M545 Low back pain: Secondary | ICD-10-CM | POA: Diagnosis not present

## 2015-03-13 DIAGNOSIS — N182 Chronic kidney disease, stage 2 (mild): Secondary | ICD-10-CM | POA: Diagnosis not present

## 2015-03-13 DIAGNOSIS — E784 Other hyperlipidemia: Secondary | ICD-10-CM | POA: Diagnosis not present

## 2015-03-13 DIAGNOSIS — I429 Cardiomyopathy, unspecified: Secondary | ICD-10-CM | POA: Diagnosis not present

## 2015-03-13 DIAGNOSIS — Z79899 Other long term (current) drug therapy: Secondary | ICD-10-CM | POA: Diagnosis not present

## 2015-03-13 DIAGNOSIS — I129 Hypertensive chronic kidney disease with stage 1 through stage 4 chronic kidney disease, or unspecified chronic kidney disease: Secondary | ICD-10-CM | POA: Diagnosis not present

## 2015-03-13 DIAGNOSIS — M1000 Idiopathic gout, unspecified site: Secondary | ICD-10-CM | POA: Diagnosis not present

## 2015-03-13 DIAGNOSIS — G3184 Mild cognitive impairment, so stated: Secondary | ICD-10-CM | POA: Diagnosis not present

## 2015-03-13 DIAGNOSIS — I2510 Atherosclerotic heart disease of native coronary artery without angina pectoris: Secondary | ICD-10-CM | POA: Diagnosis not present

## 2015-03-13 DIAGNOSIS — Z862 Personal history of diseases of the blood and blood-forming organs and certain disorders involving the immune mechanism: Secondary | ICD-10-CM | POA: Diagnosis not present

## 2015-03-13 DIAGNOSIS — F329 Major depressive disorder, single episode, unspecified: Secondary | ICD-10-CM | POA: Diagnosis not present

## 2015-03-13 DIAGNOSIS — Z7984 Long term (current) use of oral hypoglycemic drugs: Secondary | ICD-10-CM | POA: Diagnosis not present

## 2015-03-13 DIAGNOSIS — I252 Old myocardial infarction: Secondary | ICD-10-CM | POA: Diagnosis not present

## 2015-03-13 DIAGNOSIS — I5022 Chronic systolic (congestive) heart failure: Secondary | ICD-10-CM | POA: Diagnosis not present

## 2015-03-13 DIAGNOSIS — M1990 Unspecified osteoarthritis, unspecified site: Secondary | ICD-10-CM | POA: Diagnosis not present

## 2015-03-13 DIAGNOSIS — I10 Essential (primary) hypertension: Secondary | ICD-10-CM | POA: Diagnosis not present

## 2015-03-13 DIAGNOSIS — R262 Difficulty in walking, not elsewhere classified: Secondary | ICD-10-CM | POA: Diagnosis not present

## 2015-03-13 DIAGNOSIS — Z792 Long term (current) use of antibiotics: Secondary | ICD-10-CM | POA: Diagnosis not present

## 2015-03-13 DIAGNOSIS — T8131XD Disruption of external operation (surgical) wound, not elsewhere classified, subsequent encounter: Secondary | ICD-10-CM | POA: Diagnosis not present

## 2015-03-13 DIAGNOSIS — M6281 Muscle weakness (generalized): Secondary | ICD-10-CM | POA: Diagnosis not present

## 2015-03-13 DIAGNOSIS — R2681 Unsteadiness on feet: Secondary | ICD-10-CM | POA: Diagnosis not present

## 2015-03-13 DIAGNOSIS — R5381 Other malaise: Secondary | ICD-10-CM | POA: Diagnosis not present

## 2015-03-13 DIAGNOSIS — M79644 Pain in right finger(s): Secondary | ICD-10-CM | POA: Diagnosis not present

## 2015-03-13 DIAGNOSIS — M4800 Spinal stenosis, site unspecified: Secondary | ICD-10-CM | POA: Diagnosis not present

## 2015-03-13 DIAGNOSIS — D631 Anemia in chronic kidney disease: Secondary | ICD-10-CM | POA: Diagnosis not present

## 2015-03-13 DIAGNOSIS — F419 Anxiety disorder, unspecified: Secondary | ICD-10-CM | POA: Diagnosis not present

## 2015-03-13 DIAGNOSIS — Z86718 Personal history of other venous thrombosis and embolism: Secondary | ICD-10-CM | POA: Diagnosis not present

## 2015-03-13 DIAGNOSIS — K219 Gastro-esophageal reflux disease without esophagitis: Secondary | ICD-10-CM | POA: Diagnosis not present

## 2015-03-13 DIAGNOSIS — E785 Hyperlipidemia, unspecified: Secondary | ICD-10-CM | POA: Diagnosis not present

## 2015-03-13 DIAGNOSIS — D649 Anemia, unspecified: Secondary | ICD-10-CM | POA: Diagnosis not present

## 2015-03-13 DIAGNOSIS — Z8669 Personal history of other diseases of the nervous system and sense organs: Secondary | ICD-10-CM | POA: Diagnosis not present

## 2015-03-13 DIAGNOSIS — Z7982 Long term (current) use of aspirin: Secondary | ICD-10-CM | POA: Diagnosis not present

## 2015-03-13 DIAGNOSIS — F339 Major depressive disorder, recurrent, unspecified: Secondary | ICD-10-CM | POA: Diagnosis not present

## 2015-03-13 DIAGNOSIS — T8189XA Other complications of procedures, not elsewhere classified, initial encounter: Secondary | ICD-10-CM | POA: Diagnosis not present

## 2015-03-13 DIAGNOSIS — R296 Repeated falls: Secondary | ICD-10-CM | POA: Diagnosis not present

## 2015-03-13 LAB — URINALYSIS, ROUTINE W REFLEX MICROSCOPIC
Bilirubin Urine: NEGATIVE
Glucose, UA: NEGATIVE mg/dL
Hgb urine dipstick: NEGATIVE
Ketones, ur: NEGATIVE mg/dL
Leukocytes, UA: NEGATIVE
Nitrite: NEGATIVE
Protein, ur: NEGATIVE mg/dL
Specific Gravity, Urine: 1.014 (ref 1.005–1.030)
pH: 6 (ref 5.0–8.0)

## 2015-03-13 LAB — CBG MONITORING, ED
Glucose-Capillary: 114 mg/dL — ABNORMAL HIGH (ref 65–99)
Glucose-Capillary: 53 mg/dL — ABNORMAL LOW (ref 65–99)
Glucose-Capillary: 83 mg/dL (ref 65–99)

## 2015-03-13 NOTE — ED Notes (Signed)
Wife - Dawes Umana, [phone]

## 2015-03-13 NOTE — ED Notes (Signed)
Attempted report to [facility], upon being transferred by secretary, no one answered to obtain report.

## 2015-03-13 NOTE — Progress Notes (Signed)
CSW received call from [facility] with [facility].  Ms. [name] states signed FL2 was faxed to [name] at [place].  Ms. [name] will follow up with [name].  CSW to follow up with [name] at [facility] at [phone], ext. [phone].  Jason Nordmann, LCSW [phone]

## 2015-03-13 NOTE — ED Notes (Signed)
Patient was given a diet ginger ale to drink. A regular diet order taken for lunch.

## 2015-03-13 NOTE — ED Notes (Signed)
Ptar at bedside for transport of patient.

## 2015-03-13 NOTE — ED Notes (Signed)
Unable to get silvadene out of pyxis, pharmacy notified and advised they will send medication down for patient.

## 2015-03-13 NOTE — ED Notes (Signed)
Lunch tray placed at bedside. 

## 2015-03-13 NOTE — ED Notes (Signed)
Patient given sandwich and applesauce and diet coke.

## 2015-03-18 ENCOUNTER — Telehealth: Payer: Self-pay | Admitting: Cardiology

## 2015-03-18 ENCOUNTER — Encounter: Payer: Medicare Other | Admitting: *Deleted

## 2015-03-18 NOTE — Telephone Encounter (Signed)
Confirmed remote transmission w/ pt wife. She stated that pt was at [facility] and his home monitor is not there with him. She will have pt send remote transmission when he gets home.

## 2015-03-18 NOTE — Telephone Encounter (Signed)
LMOVM requesting pt to send manual transmission w/ home monitor b/c it has not updated in at least 8 days.

## 2015-03-19 ENCOUNTER — Encounter: Payer: Self-pay | Admitting: Cardiology

## 2015-03-19 DIAGNOSIS — E119 Type 2 diabetes mellitus without complications: Secondary | ICD-10-CM | POA: Diagnosis not present

## 2015-03-19 DIAGNOSIS — I5020 Unspecified systolic (congestive) heart failure: Secondary | ICD-10-CM | POA: Diagnosis not present

## 2015-03-19 DIAGNOSIS — I429 Cardiomyopathy, unspecified: Secondary | ICD-10-CM | POA: Diagnosis not present

## 2015-03-19 DIAGNOSIS — T8189XA Other complications of procedures, not elsewhere classified, initial encounter: Secondary | ICD-10-CM | POA: Diagnosis not present

## 2015-03-24 DIAGNOSIS — R5381 Other malaise: Secondary | ICD-10-CM | POA: Diagnosis not present

## 2015-03-24 DIAGNOSIS — R2681 Unsteadiness on feet: Secondary | ICD-10-CM | POA: Diagnosis not present

## 2015-03-24 DIAGNOSIS — M545 Low back pain: Secondary | ICD-10-CM | POA: Diagnosis not present

## 2015-03-24 DIAGNOSIS — M6281 Muscle weakness (generalized): Secondary | ICD-10-CM | POA: Diagnosis not present

## 2015-03-27 DIAGNOSIS — T8189XA Other complications of procedures, not elsewhere classified, initial encounter: Secondary | ICD-10-CM | POA: Diagnosis not present

## 2015-03-27 DIAGNOSIS — M4800 Spinal stenosis, site unspecified: Secondary | ICD-10-CM | POA: Diagnosis not present

## 2015-03-31 DIAGNOSIS — R5381 Other malaise: Secondary | ICD-10-CM | POA: Diagnosis not present

## 2015-03-31 DIAGNOSIS — M6281 Muscle weakness (generalized): Secondary | ICD-10-CM | POA: Diagnosis not present

## 2015-03-31 DIAGNOSIS — R2681 Unsteadiness on feet: Secondary | ICD-10-CM | POA: Diagnosis not present

## 2015-03-31 DIAGNOSIS — M545 Low back pain: Secondary | ICD-10-CM | POA: Diagnosis not present

## 2015-04-01 DIAGNOSIS — M79644 Pain in right finger(s): Secondary | ICD-10-CM | POA: Diagnosis not present

## 2015-04-01 DIAGNOSIS — M1000 Idiopathic gout, unspecified site: Secondary | ICD-10-CM | POA: Diagnosis not present

## 2015-04-03 DIAGNOSIS — T8189XA Other complications of procedures, not elsewhere classified, initial encounter: Secondary | ICD-10-CM | POA: Diagnosis not present

## 2015-04-03 DIAGNOSIS — M4800 Spinal stenosis, site unspecified: Secondary | ICD-10-CM | POA: Diagnosis not present

## 2015-04-09 ENCOUNTER — Encounter: Payer: Self-pay | Admitting: Infectious Diseases

## 2015-04-09 ENCOUNTER — Ambulatory Visit (INDEPENDENT_AMBULATORY_CARE_PROVIDER_SITE_OTHER): Payer: Medicare Other | Admitting: Infectious Diseases

## 2015-04-09 VITALS — BP 123/59 | HR 87 | Temp 97.9°F | Resp 16 | Ht 69.0 in | Wt 190.0 lb

## 2015-04-09 DIAGNOSIS — N182 Chronic kidney disease, stage 2 (mild): Secondary | ICD-10-CM

## 2015-04-09 DIAGNOSIS — E1122 Type 2 diabetes mellitus with diabetic chronic kidney disease: Secondary | ICD-10-CM | POA: Diagnosis not present

## 2015-04-09 DIAGNOSIS — T8131XD Disruption of external operation (surgical) wound, not elsewhere classified, subsequent encounter: Secondary | ICD-10-CM

## 2015-04-09 NOTE — Assessment & Plan Note (Addendum)
Needs improved control Will be key to wound healing.  pcp to address.   Has had flu shot.

## 2015-04-09 NOTE — Assessment & Plan Note (Signed)
He will complete his 6 weeks of rx on January 15.  Would stop his anbx then and pull his pic line.  Have sent note to SNF to do this.  He needs to f/u with his surgeon regarding suture removal.  rtc ID prn

## 2015-04-09 NOTE — Progress Notes (Signed)
   Subjective:    Patient ID: [name], male    DOB: [birth year], 75 y.o.   MRN: [id number]  HPI 75 yo M with hx of DM2, CHF/Cardiomyopathy, AICD. He has had multiple spinal fusion/surgeries, the last was a L3-L5 microdiscectomy, laminectomy on 10-31. He developed wound d/c after surgery and then came to office on 11-29 with wound breakdown, erythema and d/c.  He was taken to OR 11-30 and underwent I & D.  He was started on vanco/ceftriaxone post-operatively. His Cx grew Proteus (R- cefanzolin) and Enterococcus (pan-sens). His anbx were changed to unasyn (12-4) and he was d/c home on 12-5.  Today he has questions about where his infection came from.  He also relates that he was on morphine at his SNF and was hallucinating.  No fevers or chills.  Wound has one small area that is weeping. Non-painful per pt, "feels like it is sore".  No problems with PIC except it leaked this AM. No erythema.    States he had hypoglycemic episode to 40s yesterday.   I asked him if he had a good Christmas and he stated that he does not celebrate this since the death of his son in the 14s.   Review of Systems  Constitutional: Negative for fever, chills, appetite change and unexpected weight change.  Gastrointestinal: Negative for diarrhea and constipation.  Genitourinary: Negative for difficulty urinating.  Musculoskeletal: Negative for back pain.  Please see HPI. 12 point ROS o/w (-)     Objective:   Physical Exam  Constitutional: He appears well-developed and well-nourished.  HENT:  Mouth/Throat: No oropharyngeal exudate.  Eyes: EOM are normal. Pupils are equal, round, and reactive to light.  Neck: Neck supple.  Cardiovascular: Normal rate, regular rhythm and normal heart sounds.   Pulmonary/Chest: Effort normal and breath sounds normal.  Abdominal: Soft. Bowel sounds are normal. There is no tenderness. There is no rebound.  Musculoskeletal:       Arms: Lymphadenopathy:    He has no  cervical adenopathy.          Assessment & Plan:

## 2015-04-10 ENCOUNTER — Other Ambulatory Visit: Payer: Self-pay | Admitting: *Deleted

## 2015-04-10 NOTE — Telephone Encounter (Signed)
Per wound care center the patient is not taking these medications any longer.

## 2015-04-13 DIAGNOSIS — M6281 Muscle weakness (generalized): Secondary | ICD-10-CM | POA: Diagnosis not present

## 2015-04-13 DIAGNOSIS — R5381 Other malaise: Secondary | ICD-10-CM | POA: Diagnosis not present

## 2015-04-13 DIAGNOSIS — G3184 Mild cognitive impairment, so stated: Secondary | ICD-10-CM | POA: Diagnosis not present

## 2015-04-13 DIAGNOSIS — M545 Low back pain: Secondary | ICD-10-CM | POA: Diagnosis not present

## 2015-05-23 ENCOUNTER — Other Ambulatory Visit (HOSPITAL_COMMUNITY): Payer: Self-pay | Admitting: Adult Health

## 2015-05-23 ENCOUNTER — Other Ambulatory Visit (HOSPITAL_COMMUNITY): Payer: Self-pay | Admitting: Cardiology

## 2015-05-28 ENCOUNTER — Other Ambulatory Visit (HOSPITAL_COMMUNITY): Payer: Self-pay | Admitting: *Deleted

## 2015-05-29 ENCOUNTER — Telehealth: Payer: Self-pay | Admitting: Cardiology

## 2015-05-29 NOTE — Telephone Encounter (Signed)
LMOVM requesting that pt send remote transmission b/c home monitor has not updated in the last 8 days.

## 2015-06-09 DIAGNOSIS — E1142 Type 2 diabetes mellitus with diabetic polyneuropathy: Secondary | ICD-10-CM | POA: Diagnosis not present

## 2015-06-09 DIAGNOSIS — N183 Chronic kidney disease, stage 3 (moderate): Secondary | ICD-10-CM | POA: Diagnosis not present

## 2015-06-09 DIAGNOSIS — Z0000 Encounter for general adult medical examination without abnormal findings: Secondary | ICD-10-CM | POA: Diagnosis not present

## 2015-06-19 ENCOUNTER — Telehealth: Payer: Self-pay | Admitting: Cardiology

## 2015-06-19 NOTE — Telephone Encounter (Signed)
Attempted to call pt b/c his home monitor has not updated in at least 8 days. No answer and unable to leave a message

## 2015-06-29 ENCOUNTER — Telehealth: Payer: Self-pay | Admitting: Cardiology

## 2015-06-29 NOTE — Telephone Encounter (Signed)
LMOVM for pt requesting that he send a manual transmission using his home monitor b/c his home monitor has not updated in at least 8 days.  

## 2015-06-29 NOTE — Telephone Encounter (Signed)
Pt wife called and stated that they can not send remote transmission right now b/c of electrically problems. Pt has appt w/ device clinic on 07-08-2015. Instructed pt wife to keep the appointment and to keep [place] updated on the electrically problem. Pt wife verbalized understanding.

## 2015-06-30 DIAGNOSIS — M545 Low back pain: Secondary | ICD-10-CM | POA: Diagnosis not present

## 2015-06-30 DIAGNOSIS — M25551 Pain in right hip: Secondary | ICD-10-CM | POA: Diagnosis not present

## 2015-07-02 ENCOUNTER — Other Ambulatory Visit (HOSPITAL_COMMUNITY): Payer: Self-pay | Admitting: Internal Medicine

## 2015-07-02 ENCOUNTER — Other Ambulatory Visit (HOSPITAL_COMMUNITY): Payer: Self-pay | Admitting: Adult Health

## 2015-07-08 ENCOUNTER — Other Ambulatory Visit (HOSPITAL_COMMUNITY): Payer: Self-pay | Admitting: *Deleted

## 2015-07-08 MED ORDER — CARVEDILOL 12.5 MG PO TABS: 12.5000 mg | ORAL_TABLET | Freq: Two times a day (BID) | ORAL | Status: DC

## 2015-07-16 ENCOUNTER — Telehealth: Payer: Self-pay | Admitting: *Deleted

## 2015-07-16 NOTE — Telephone Encounter (Signed)
Opened in error

## 2015-07-28 ENCOUNTER — Telehealth (HOSPITAL_COMMUNITY): Payer: Self-pay | Admitting: Vascular Surgery

## 2015-07-28 NOTE — Telephone Encounter (Signed)
Left pt message to reschedule pt appt

## 2015-07-29 ENCOUNTER — Telehealth (HOSPITAL_COMMUNITY): Payer: Self-pay | Admitting: Vascular Surgery

## 2015-07-29 NOTE — Telephone Encounter (Signed)
Left message to reschedule appt

## 2015-07-31 ENCOUNTER — Encounter (HOSPITAL_COMMUNITY): Payer: Medicare Other

## 2015-08-11 ENCOUNTER — Telehealth (HOSPITAL_COMMUNITY): Payer: Self-pay | Admitting: Vascular Surgery

## 2015-08-11 ENCOUNTER — Encounter (HOSPITAL_COMMUNITY): Payer: Medicare Other

## 2015-08-11 NOTE — Telephone Encounter (Signed)
Returned pt call to reschedule apt

## 2015-08-14 ENCOUNTER — Telehealth (HOSPITAL_COMMUNITY): Payer: Self-pay | Admitting: Vascular Surgery

## 2015-08-14 NOTE — Telephone Encounter (Signed)
Returned pt wife call to schedule an afternoon apt w/ [facility]

## 2015-09-02 ENCOUNTER — Telehealth (HOSPITAL_COMMUNITY): Payer: Self-pay

## 2015-09-02 MED ORDER — ISOSORBIDE MONONITRATE ER 30 MG PO TB24: 30.0000 mg | ORAL_TABLET | Freq: Every day | ORAL | Status: DC

## 2015-09-02 NOTE — Telephone Encounter (Signed)
Wife called stating "their cat got into the isosorbide and it's gone". Will send it one 30 day refill, advised he has not been seen in 1.5 years and will require apt with Dr. Shirlee Latch at CHF clinic before any more future refills will be approved. Will forward to CHF clinical scheduler [place] to schedule next available opening with Dr. Shirlee Latch.  Ave Filter

## 2015-09-07 ENCOUNTER — Telehealth: Payer: Self-pay | Admitting: Cardiology

## 2015-09-07 NOTE — Telephone Encounter (Signed)
LMOVM requesting that pt send manual transmission b/c home monitor has not updated in at least 8 days. Asked that pt return my call and left my direct number on the voicemail.

## 2015-09-10 ENCOUNTER — Telehealth (HOSPITAL_COMMUNITY): Payer: Self-pay | Admitting: Pharmacist

## 2015-09-10 NOTE — Telephone Encounter (Signed)
Corlanor 5 mg PO BID PA approved by [employer] through 04/03/16.   Tyler Deis. Bonnye Fava, PharmD, BCPS, CPP Clinical Pharmacist Pager: [phone] Phone: [phone] 09/10/2015 11:33 AM

## 2015-09-12 ENCOUNTER — Other Ambulatory Visit (HOSPITAL_COMMUNITY): Payer: Self-pay | Admitting: Cardiology

## 2015-10-01 ENCOUNTER — Encounter: Payer: Self-pay | Admitting: Internal Medicine

## 2015-10-13 ENCOUNTER — Encounter (HOSPITAL_COMMUNITY): Payer: Self-pay

## 2015-10-13 ENCOUNTER — Ambulatory Visit (INDEPENDENT_AMBULATORY_CARE_PROVIDER_SITE_OTHER): Payer: Medicare Other | Admitting: *Deleted

## 2015-10-13 ENCOUNTER — Ambulatory Visit (HOSPITAL_COMMUNITY)
Admission: RE | Admit: 2015-10-13 | Discharge: 2015-10-13 | Disposition: A | Discharge status: Home / Self Care | Payer: Medicare Other | Source: Ambulatory Visit | Attending: Cardiology | Admitting: Cardiology

## 2015-10-13 VITALS — BP 136/74 | HR 73 | Wt 182.0 lb

## 2015-10-13 DIAGNOSIS — I130 Hypertensive heart and chronic kidney disease with heart failure and stage 1 through stage 4 chronic kidney disease, or unspecified chronic kidney disease: Secondary | ICD-10-CM | POA: Diagnosis not present

## 2015-10-13 DIAGNOSIS — R42 Dizziness and giddiness: Secondary | ICD-10-CM | POA: Diagnosis not present

## 2015-10-13 DIAGNOSIS — F329 Major depressive disorder, single episode, unspecified: Secondary | ICD-10-CM | POA: Insufficient documentation

## 2015-10-13 DIAGNOSIS — E785 Hyperlipidemia, unspecified: Secondary | ICD-10-CM | POA: Diagnosis not present

## 2015-10-13 DIAGNOSIS — Z7984 Long term (current) use of oral hypoglycemic drugs: Secondary | ICD-10-CM | POA: Insufficient documentation

## 2015-10-13 DIAGNOSIS — I255 Ischemic cardiomyopathy: Secondary | ICD-10-CM

## 2015-10-13 DIAGNOSIS — K219 Gastro-esophageal reflux disease without esophagitis: Secondary | ICD-10-CM | POA: Diagnosis not present

## 2015-10-13 DIAGNOSIS — Z7982 Long term (current) use of aspirin: Secondary | ICD-10-CM | POA: Insufficient documentation

## 2015-10-13 DIAGNOSIS — G9349 Other encephalopathy: Secondary | ICD-10-CM | POA: Diagnosis not present

## 2015-10-13 DIAGNOSIS — N184 Chronic kidney disease, stage 4 (severe): Secondary | ICD-10-CM | POA: Diagnosis not present

## 2015-10-13 DIAGNOSIS — I252 Old myocardial infarction: Secondary | ICD-10-CM | POA: Insufficient documentation

## 2015-10-13 DIAGNOSIS — E162 Hypoglycemia, unspecified: Secondary | ICD-10-CM | POA: Diagnosis not present

## 2015-10-13 DIAGNOSIS — I4892 Unspecified atrial flutter: Secondary | ICD-10-CM | POA: Insufficient documentation

## 2015-10-13 DIAGNOSIS — I872 Venous insufficiency (chronic) (peripheral): Secondary | ICD-10-CM | POA: Insufficient documentation

## 2015-10-13 DIAGNOSIS — G8250 Quadriplegia, unspecified: Secondary | ICD-10-CM | POA: Diagnosis not present

## 2015-10-13 DIAGNOSIS — Z95810 Presence of automatic (implantable) cardiac defibrillator: Secondary | ICD-10-CM | POA: Diagnosis not present

## 2015-10-13 DIAGNOSIS — M1990 Unspecified osteoarthritis, unspecified site: Secondary | ICD-10-CM | POA: Diagnosis not present

## 2015-10-13 DIAGNOSIS — K449 Diaphragmatic hernia without obstruction or gangrene: Secondary | ICD-10-CM | POA: Insufficient documentation

## 2015-10-13 DIAGNOSIS — Z86718 Personal history of other venous thrombosis and embolism: Secondary | ICD-10-CM | POA: Diagnosis not present

## 2015-10-13 DIAGNOSIS — I5022 Chronic systolic (congestive) heart failure: Secondary | ICD-10-CM | POA: Diagnosis not present

## 2015-10-13 DIAGNOSIS — M4802 Spinal stenosis, cervical region: Secondary | ICD-10-CM | POA: Insufficient documentation

## 2015-10-13 DIAGNOSIS — G9050 Complex regional pain syndrome I, unspecified: Secondary | ICD-10-CM | POA: Diagnosis not present

## 2015-10-13 DIAGNOSIS — N183 Chronic kidney disease, stage 3 unspecified: Secondary | ICD-10-CM

## 2015-10-13 DIAGNOSIS — I2510 Atherosclerotic heart disease of native coronary artery without angina pectoris: Secondary | ICD-10-CM | POA: Diagnosis not present

## 2015-10-13 DIAGNOSIS — I10 Essential (primary) hypertension: Secondary | ICD-10-CM

## 2015-10-13 DIAGNOSIS — I5021 Acute systolic (congestive) heart failure: Secondary | ICD-10-CM

## 2015-10-13 DIAGNOSIS — G9589 Other specified diseases of spinal cord: Secondary | ICD-10-CM | POA: Insufficient documentation

## 2015-10-13 DIAGNOSIS — E1122 Type 2 diabetes mellitus with diabetic chronic kidney disease: Secondary | ICD-10-CM | POA: Diagnosis not present

## 2015-10-13 LAB — BASIC METABOLIC PANEL
Anion gap: 11 (ref 5–15)
BUN: 26 mg/dL — AB (ref 6–20)
CHLORIDE: 105 mmol/L (ref 101–111)
CO2: 22 mmol/L (ref 22–32)
Calcium: 9.6 mg/dL (ref 8.9–10.3)
Creatinine, Ser: 1.35 mg/dL — ABNORMAL HIGH (ref 0.61–1.24)
GFR calc non Af Amer: 50 mL/min — ABNORMAL LOW (ref >60)
GFR, EST AFRICAN AMERICAN: 58 mL/min — AB (ref >60)
Glucose, Bld: 153 mg/dL — ABNORMAL HIGH (ref 65–99)
POTASSIUM: 4.5 mmol/L (ref 3.5–5.1)
SODIUM: 138 mmol/L (ref 135–145)

## 2015-10-13 LAB — LIPID PANEL
CHOL/HDL RATIO: 5.5 ratio
Cholesterol: 131 mg/dL (ref 0–200)
HDL: 24 mg/dL — ABNORMAL LOW (ref >40)
LDL Cholesterol: 84 mg/dL (ref 0–99)
TRIGLYCERIDES: 114 mg/dL (ref <150)
VLDL: 23 mg/dL (ref 0–40)

## 2015-10-13 MED ORDER — IVABRADINE HCL 5 MG PO TABS: 5.0000 mg | ORAL_TABLET | Freq: Two times a day (BID) | ORAL | Status: DC

## 2015-10-13 MED ORDER — CARVEDILOL 12.5 MG PO TABS: 12.5000 mg | ORAL_TABLET | Freq: Two times a day (BID) | ORAL | Status: DC

## 2015-10-13 NOTE — Patient Instructions (Signed)
Routine lab work today. Will notify you of abnormal results  Follow up and Echocardiogram with Dr.McLean in 2 months

## 2015-10-13 NOTE — Progress Notes (Signed)
Patient ID: [name], male   DOB: October 23, 1940, 75 y.o.   MRN: [id number]    Advanced Heart Failure Clinic Note   PCP: Dr Loleta Chance EP: Dr Ladona Ridgel  HPI: [name] is a 75 yo male with history of DM, HTN, hyperlipidemia, CKD stage III (Creatinine 1.7) and DVT (RUE). Diagnosed with systolic HF in 7/14 with EF 20%. found to have CAD with totally occluded RCA and high-grade lesion in proximal portion of small LCX. LV dysfunction felt to be out of proportion to CAD. Managed medically.   While in the hospital had intermittent atrial flutter while in the hospital thought to be from Milrinone. Diuresed with lasix and milrinone. Discharge weight 196 pounds.   He returns today for followup. Last seen in HF clinic 12/2014. Has been stable since last visit.  Has had 2 back surgerys. (initial urgery and redo). Back has been better since the surgery. Had also spent some time in a SNF when his wife was sick in December. Denies SOB.  Walks with a walker. Just takes his time. Denies orthopnea. No palpitations. BP slightly elevated today but states it is normally not elevated. Denies CP. Watches his fluid and tries to watch his salt. Weight at home 177-182 lbs. Taking lasix every other day. He states his SBP is usually < 120. He occasionally gets lightheaded with standing and does not want his medicines increased.   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 The RCA is occluded with collaterals and the proximal LCx has a tight stenosis.  - ECHO 03/19/13: EF 20%, mild AS - Echo (9/16) with EF 45-50%, basal inferior/basal-mid inferolateral/basal anterolateral AK, normal RV size and systolic function, aortic sclerosis without stenosis, mild MR, PASP 50 mmHg.   Labs: 11/20/12 Creatinine 1.5 Potassium 4.1 11/27/12  Creatinine 1.39 Potassium 3.9 01/09/13 Creatinine 2.05, KCL 5.5, pro-BNP 1187, cholesterol 121, TG 72, HDL 29, LDL 78 01/15/13 K+ 5.4, creatinine 1.7 01/24/13: K+ 4.7, creatinine 1.79, proBNP 2103 05/16/13 K 5.0 Creatinine 1.45  07/03/13 K 5.1 Creatinine 1.57  9/15 K 4.7, creatinine 1.45, LDL 72 04/14/2014: K 4.7 Creatinine 1.2  1/16: K 4.7, creatinine 1.2 12/16: K 4.4, creatinine 1.78  SH: Married, lives at home with his wife.  Nonsmoker.   FH:  CAD  ROS: All systems negative except as listed in HPI, PMH and Problem List.  Past Medical History  Diagnosis Date  . Hypertension   . Hyperlipidemia   . Cervical spinal stenosis   . Lumbar spinal stenosis   . 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 DVT (deep venous thrombosis) (HCC) 10/2011    RUE; pt denies this hx on 05/16/2013  . Anemia   . Cardiomyopathy (HCC)     a. 10/2012 Echo: EF 20-25%, diff HK, mod dil LA/RV/RA.  Marland Kitchen CAD (coronary artery disease)   . Atrial flutter (HCC)   . CKD (chronic kidney disease), stage III   . Venous insufficiency   . Frequent falls   . 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 Chronic systolic congestive heart failure (HCC)   . Quadriparesis (HCC) 2013    pre op  . Hypoglycemia   . Cervical myelopathy (HCC)   .  Acute encephalopathy   . Mononeuritis of unspecified site   . Dysrhythmia   . H/O hiatal hernia   . Arthritis     "all over" (06-02-2013)  . Depression     "son died in service in 08-11-90; still bothers me" (2013/06/02)  . AICD (automatic cardioverter/defibrillator) present   . Shortness of breath dyspnea     with exertion  . Pneumonia 1960    , also 2014  . Type II diabetes mellitus (HCC)     type 2  . GERD (gastroesophageal reflux disease)   . Myocardial infarction [facility])     Current Outpatient Prescriptions  Medication Sig Dispense Refill  . acarbose (PRECOSE) 50 MG tablet Take 50 mg by mouth 3 (three) times daily with meals. Take 1 tablet 1-3  times a day    . aspirin EC 81 MG tablet Take 1 tablet (81 mg total) by mouth daily. 30 tablet 6  . carvedilol (COREG) 12.5 MG tablet Take 1 tablet (12.5 mg total) by mouth 2 (two) times daily with a meal. 60 tablet 2  . CORLANOR 5 MG TABS tablet TAKE ONE TABLET BY MOUTH TWICE DAILY WITH A MEAL 180 tablet 0  . cyclobenzaprine (FLEXERIL) 5 MG tablet Take 1 tablet (5 mg total) by mouth 3 (three) times daily as needed for muscle spasms. 50 tablet 1  . docusate sodium (COLACE) 100 MG capsule Take 300 mg by mouth 2 (two) times daily.     . fluticasone (FLONASE) 50 MCG/ACT nasal spray Place 2 sprays into the nose daily as needed.     . furosemide (LASIX) 40 MG tablet Take 40 mg by mouth daily as needed (abdominal or leg swelling).     . gabapentin (NEURONTIN) 300 MG capsule Take 300 mg by mouth 3 (three) times daily.    Marland Kitchen glimepiride (AMARYL) 2 MG tablet Take 2 mg by mouth 2 (two) times daily.    . hydrALAZINE (APRESOLINE) 25 MG tablet TAKE ONE-HALF TABLET BY MOUTH THREE TIMES DAILY 405 tablet 2  . isosorbide mononitrate (IMDUR) 30 MG 24 hr tablet Take 1 tablet (30 mg total) by mouth daily. 30 tablet 3  . magnesium hydroxide (MILK OF MAGNESIA) 400 MG/5ML suspension Take 30 mL by mouth daily as needed for constipation.    . metFORMIN (GLUCOPHAGE) 500 MG tablet Take 500 mg by mouth 2 (two) times daily with a meal.     . oxyCODONE-acetaminophen (PERCOCET/ROXICET) 5-325 MG tablet Take 1-2 tablets by mouth every 4 (four) hours as needed for moderate pain. 30 tablet 0  . pravastatin (PRAVACHOL) 40 MG tablet Take 1 tablet (40 mg total) by mouth at bedtime. 30 tablet 3  . saw palmetto 500 MG capsule Take 500 mg by mouth 3 (three) times daily.    . phenylephrine (NEO-SYNEPHRINE) 0.25 % nasal spray Place 1 spray into both nostrils every 6 (six) hours as needed for congestion. Reported on 10/13/2015     No current facility-administered medications for this encounter.   Filed Vitals:   10/13/15 1534  BP: 136/74    Pulse: 73  Weight: 182 lb (82.555 kg)  SpO2: 100%   Wt Readings from Last 3 Encounters:  10/13/15 182 lb (82.555 kg)  04/09/15 190 lb (86.183 kg)  03/12/15 182 lb (82.555 kg)     PHYSICAL EXAM: General: Elderly. NAD uses walker. Wife present  HEENT: normal Neck: supple. JVP 6-7  Carotids 2+ bilaterally; no bruits. No thyromegaly or nodule noted.  Cor: PMI normal. Distant heart  sounds. RRR. No rubs or gallops +SEM RSB L upper chest scar from ICD.    Lungs: CTAB, normal effort Abdomen: soft, NT, ND, no HSM. No bruits or masses. +BS  Extremities: no cyanosis, clubbing, rash.  No lower extremity edema   Neuro: alert & orientedx3, cranial nerves grossly intact. Moves all 4 extremities w/o difficulty. Affect pleasant.   Assessment/Plan: 1. Chronic systolic CHF: Mixed Ischemic/nonischemic cardiomyopathy with chronic LBBB s/p [employer] CRT-D, EF 45-50% Echo 12/2014 (improved from 20-25%) - Volume stable.  NYHA class II symptoms, though seems limited in his activity by his back problems.  - Continue Lasix 40 mg every other day. Needs BMET today. Last labwork in our system 03/2015.    - Continue Coreg 12.5 mg BID - Continue hydralazine 12.5 mg TID and Imdur 30 mg daily. He has previously failed uptitration due to orthostatic symptoms and refuses attempt today.  - He is not on an ACE-I or spironolactone with CKD. BMET today.  - Continue ivabradine 5 mg bid.  - Follow up 2 months with Echo (yearly) 2. CAD: LHC at last 12/2012 showed occluded RCA (chronic) with 90% stenosis in small LCx. - Denies chest pain.  It was decided to avoid PCI to LCx as the vessel was small, he had no chest pain, PCI was unlikely to help LV function significantly, and PCI could compromise the major OM.   - Continue ASA 81 mg daily, pravastatin 40 mg daily, and BB as above.  - Check lipids today, goal LDL < 70. Stable in 12/2014. 3. Chronic kidney disease, Stage 3-4:  - Most recent creatinine 1.7. (03/2015)  Repeat BMET  today.  4. HTN - Hypertension mildly elevated, though he has had difficulties with up titration in the past. He states all blood pressures in recent months have been stable, and refuses medications up-titration at this time.  With risk of hypotension, history of frequent falls, and occasional lightheadedness, will not up-titrate at this time.   Relatively stable on current meds, apart from mildly elevated HTN as above.  Check BMET and Lipids today.  Will follow up in 2 months with Echo.   Refill coreg and corlanor.   Mariam Dollar Paulena Servais PA-C 10/13/2015

## 2015-10-15 NOTE — Progress Notes (Signed)
Remote ICD transmission.   

## 2015-10-16 ENCOUNTER — Encounter: Payer: Self-pay | Admitting: Cardiology

## 2015-11-09 LAB — CUP PACEART REMOTE DEVICE CHECK
Brady Statistic RA Percent Paced: 18 %
Brady Statistic RV Percent Paced: 99 %
Date Time Interrogation Session: 20170807110601
HIGH POWER IMPEDANCE MEASURED VALUE: 54 Ohm
Implantable Lead Implant Date: 20150212
Implantable Lead Implant Date: 20150212
Implantable Lead Location: 753858
Implantable Lead Model: 7122
Lead Channel Impedance Value: 400 Ohm
Lead Channel Impedance Value: 440 Ohm
Lead Channel Impedance Value: 610 Ohm
Lead Channel Pacing Threshold Amplitude: 1.25 V
Lead Channel Pacing Threshold Amplitude: 2 V
Lead Channel Setting Pacing Amplitude: 3 V
MDC IDC LEAD IMPLANT DT: 20150212
MDC IDC LEAD LOCATION: 753859
MDC IDC LEAD LOCATION: 753860
MDC IDC MSMT LEADCHNL LV PACING THRESHOLD PULSEWIDTH: 0.8 ms
MDC IDC MSMT LEADCHNL RA SENSING INTR AMPL: 2.1 mV
MDC IDC MSMT LEADCHNL RV PACING THRESHOLD PULSEWIDTH: 0.4 ms
MDC IDC MSMT LEADCHNL RV SENSING INTR AMPL: 12 mV
MDC IDC SET LEADCHNL LV PACING PULSEWIDTH: 0.8 ms
MDC IDC SET LEADCHNL RA PACING AMPLITUDE: 2 V
MDC IDC SET LEADCHNL RV PACING AMPLITUDE: 2.125
MDC IDC SET LEADCHNL RV PACING PULSEWIDTH: 0.5 ms
MDC IDC SET LEADCHNL RV SENSING SENSITIVITY: 0.5 mV
Pulse Gen Serial Number: 7170834

## 2015-11-17 LAB — CBC AND DIFFERENTIAL
HCT: 30 % — AB (ref 41–53)
Hemoglobin: 9.7 g/dL — AB (ref 13.5–17.5)
PLATELETS: 202 10*3/uL (ref 150–399)
WBC: 5.8 10^3/mL

## 2015-11-24 ENCOUNTER — Emergency Department (HOSPITAL_COMMUNITY): Payer: Medicare Other

## 2015-11-24 ENCOUNTER — Inpatient Hospital Stay (HOSPITAL_COMMUNITY)
Admission: EM | Admit: 2015-11-24 | Discharge: 2015-11-28 | DRG: 064 | Disposition: A | Discharge status: Skilled Nursing Facility | Payer: Medicare Other | Attending: Internal Medicine | Admitting: Internal Medicine

## 2015-11-24 ENCOUNTER — Encounter (HOSPITAL_COMMUNITY): Payer: Self-pay

## 2015-11-24 DIAGNOSIS — Z8249 Family history of ischemic heart disease and other diseases of the circulatory system: Secondary | ICD-10-CM

## 2015-11-24 DIAGNOSIS — I4892 Unspecified atrial flutter: Secondary | ICD-10-CM | POA: Diagnosis present

## 2015-11-24 DIAGNOSIS — Z981 Arthrodesis status: Secondary | ICD-10-CM

## 2015-11-24 DIAGNOSIS — E785 Hyperlipidemia, unspecified: Secondary | ICD-10-CM | POA: Diagnosis present

## 2015-11-24 DIAGNOSIS — Z7982 Long term (current) use of aspirin: Secondary | ICD-10-CM

## 2015-11-24 DIAGNOSIS — I2510 Atherosclerotic heart disease of native coronary artery without angina pectoris: Secondary | ICD-10-CM | POA: Diagnosis not present

## 2015-11-24 DIAGNOSIS — I10 Essential (primary) hypertension: Secondary | ICD-10-CM | POA: Diagnosis present

## 2015-11-24 DIAGNOSIS — E1141 Type 2 diabetes mellitus with diabetic mononeuropathy: Secondary | ICD-10-CM | POA: Diagnosis present

## 2015-11-24 DIAGNOSIS — K219 Gastro-esophageal reflux disease without esophagitis: Secondary | ICD-10-CM | POA: Diagnosis present

## 2015-11-24 DIAGNOSIS — I63532 Cerebral infarction due to unspecified occlusion or stenosis of left posterior cerebral artery: Secondary | ICD-10-CM

## 2015-11-24 DIAGNOSIS — G936 Cerebral edema: Secondary | ICD-10-CM | POA: Diagnosis not present

## 2015-11-24 DIAGNOSIS — R296 Repeated falls: Secondary | ICD-10-CM | POA: Diagnosis present

## 2015-11-24 DIAGNOSIS — I429 Cardiomyopathy, unspecified: Secondary | ICD-10-CM | POA: Diagnosis present

## 2015-11-24 DIAGNOSIS — I639 Cerebral infarction, unspecified: Secondary | ICD-10-CM | POA: Diagnosis present

## 2015-11-24 DIAGNOSIS — G9341 Metabolic encephalopathy: Secondary | ICD-10-CM | POA: Diagnosis present

## 2015-11-24 DIAGNOSIS — R4701 Aphasia: Secondary | ICD-10-CM | POA: Diagnosis present

## 2015-11-24 DIAGNOSIS — Z96641 Presence of right artificial hip joint: Secondary | ICD-10-CM | POA: Diagnosis present

## 2015-11-24 DIAGNOSIS — L03032 Cellulitis of left toe: Secondary | ICD-10-CM | POA: Diagnosis present

## 2015-11-24 DIAGNOSIS — I350 Nonrheumatic aortic (valve) stenosis: Secondary | ICD-10-CM | POA: Diagnosis present

## 2015-11-24 DIAGNOSIS — E1122 Type 2 diabetes mellitus with diabetic chronic kidney disease: Secondary | ICD-10-CM | POA: Diagnosis present

## 2015-11-24 DIAGNOSIS — N184 Chronic kidney disease, stage 4 (severe): Secondary | ICD-10-CM | POA: Diagnosis not present

## 2015-11-24 DIAGNOSIS — R471 Dysarthria and anarthria: Secondary | ICD-10-CM | POA: Diagnosis not present

## 2015-11-24 DIAGNOSIS — R29703 NIHSS score 3: Secondary | ICD-10-CM | POA: Diagnosis present

## 2015-11-24 DIAGNOSIS — I130 Hypertensive heart and chronic kidney disease with heart failure and stage 1 through stage 4 chronic kidney disease, or unspecified chronic kidney disease: Secondary | ICD-10-CM | POA: Diagnosis present

## 2015-11-24 DIAGNOSIS — Z86718 Personal history of other venous thrombosis and embolism: Secondary | ICD-10-CM

## 2015-11-24 DIAGNOSIS — Z833 Family history of diabetes mellitus: Secondary | ICD-10-CM

## 2015-11-24 DIAGNOSIS — I5022 Chronic systolic (congestive) heart failure: Secondary | ICD-10-CM | POA: Diagnosis present

## 2015-11-24 DIAGNOSIS — I872 Venous insufficiency (chronic) (peripheral): Secondary | ICD-10-CM | POA: Diagnosis not present

## 2015-11-24 DIAGNOSIS — M86272 Subacute osteomyelitis, left ankle and foot: Secondary | ICD-10-CM | POA: Diagnosis not present

## 2015-11-24 DIAGNOSIS — E1142 Type 2 diabetes mellitus with diabetic polyneuropathy: Secondary | ICD-10-CM | POA: Diagnosis present

## 2015-11-24 DIAGNOSIS — Z79899 Other long term (current) drug therapy: Secondary | ICD-10-CM

## 2015-11-24 DIAGNOSIS — W19XXXA Unspecified fall, initial encounter: Secondary | ICD-10-CM | POA: Diagnosis present

## 2015-11-24 DIAGNOSIS — Z888 Allergy status to other drugs, medicaments and biological substances status: Secondary | ICD-10-CM

## 2015-11-24 DIAGNOSIS — I63332 Cerebral infarction due to thrombosis of left posterior cerebral artery: Secondary | ICD-10-CM | POA: Diagnosis not present

## 2015-11-24 DIAGNOSIS — I63432 Cerebral infarction due to embolism of left posterior cerebral artery: Secondary | ICD-10-CM | POA: Diagnosis not present

## 2015-11-24 DIAGNOSIS — Z95810 Presence of automatic (implantable) cardiac defibrillator: Secondary | ICD-10-CM

## 2015-11-24 DIAGNOSIS — F329 Major depressive disorder, single episode, unspecified: Secondary | ICD-10-CM | POA: Diagnosis present

## 2015-11-24 LAB — DIFFERENTIAL
BASOS ABS: 0 10*3/uL (ref 0.0–0.1)
BASOS PCT: 0 %
Eosinophils Absolute: 0.3 10*3/uL (ref 0.0–0.7)
Eosinophils Relative: 3 %
LYMPHS ABS: 1.9 10*3/uL (ref 0.7–4.0)
Lymphocytes Relative: 24 %
MONO ABS: 0.7 10*3/uL (ref 0.1–1.0)
MONOS PCT: 9 %
NEUTROS ABS: 5.1 10*3/uL (ref 1.7–7.7)
Neutrophils Relative %: 64 %

## 2015-11-24 LAB — COMPREHENSIVE METABOLIC PANEL
ALK PHOS: 58 U/L (ref 38–126)
ALT: 8 U/L — AB (ref 17–63)
AST: 19 U/L (ref 15–41)
Albumin: 4 g/dL (ref 3.5–5.0)
Anion gap: 8 (ref 5–15)
BILIRUBIN TOTAL: 0.4 mg/dL (ref 0.3–1.2)
BUN: 25 mg/dL — AB (ref 6–20)
CALCIUM: 9.8 mg/dL (ref 8.9–10.3)
CHLORIDE: 104 mmol/L (ref 101–111)
CO2: 25 mmol/L (ref 22–32)
CREATININE: 1.41 mg/dL — AB (ref 0.61–1.24)
GFR, EST AFRICAN AMERICAN: 55 mL/min — AB (ref >60)
GFR, EST NON AFRICAN AMERICAN: 48 mL/min — AB (ref >60)
Glucose, Bld: 53 mg/dL — ABNORMAL LOW (ref 65–99)
Potassium: 4.3 mmol/L (ref 3.5–5.1)
Sodium: 137 mmol/L (ref 135–145)
TOTAL PROTEIN: 7.4 g/dL (ref 6.5–8.1)

## 2015-11-24 LAB — PROTIME-INR
INR: 1.1
PROTHROMBIN TIME: 14.2 s (ref 11.4–15.2)

## 2015-11-24 LAB — CBG MONITORING, ED
Glucose-Capillary: 164 mg/dL — ABNORMAL HIGH (ref 65–99)
Glucose-Capillary: 75 mg/dL (ref 65–99)

## 2015-11-24 LAB — CBC
HCT: 37.5 % — ABNORMAL LOW (ref 39.0–52.0)
HEMOGLOBIN: 11.9 g/dL — AB (ref 13.0–17.0)
MCH: 29.9 pg (ref 26.0–34.0)
MCHC: 31.7 g/dL (ref 30.0–36.0)
MCV: 94.2 fL (ref 78.0–100.0)
Platelets: 270 10*3/uL (ref 150–400)
RBC: 3.98 MIL/uL — AB (ref 4.22–5.81)
RDW: 14 % (ref 11.5–15.5)
WBC: 8 10*3/uL (ref 4.0–10.5)

## 2015-11-24 LAB — APTT: APTT: 32 s (ref 24–36)

## 2015-11-24 LAB — I-STAT TROPONIN, ED: TROPONIN I, POC: 0 ng/mL (ref 0.00–0.08)

## 2015-11-24 NOTE — ED Provider Notes (Signed)
[facility]-EMERGENCY DEPT Provider Note   CSN: [id number] Arrival date & time: 11/24/15  1702     History   Chief Complaint Chief Complaint  Patient presents with  . Aphasia    HPI [name] is a 75 y.o. male.  Patient is brought to the emergency department after and was noted to have new onset confusion and occasional word finding issues beginning 2 days ago.  This morning he had a fall approximately 3:30 AM.  He fell when trying to get to the kitchen.  He has had balance issues.  He has hypertension and hyperlipidemia.   The history is provided by the patient.    Past Medical History:  Diagnosis Date  . Acute encephalopathy   . AICD (automatic cardioverter/defibrillator) present   . Anemia   . Arthritis    "all over" (05/16/2013)  . Atrial flutter (HCC)   . CAD (coronary artery disease)   . Cardiomyopathy (HCC)    a. 10/2012 Echo: EF 20-25%, diff HK, mod dil LA/RV/RA.  Marland Kitchen Cervical myelopathy (HCC)   . Cervical spinal stenosis   . Chronic systolic congestive heart failure (HCC)   . CKD (chronic kidney disease), stage III   . 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 Depression    "son died in service in Aug 22, 1990; still bothers me" (05/16/2013)  . DVT (deep venous thrombosis) (HCC) 10/2011   RUE; pt denies this hx on 05/16/2013  . Dysrhythmia   . Frequent falls   . GERD (gastroesophageal reflux disease)   . H/O hiatal hernia   . Hyperlipidemia   . Hypertension   . Hypoglycemia   . Lumbar spinal stenosis   . Mononeuritis of unspecified site   . Myocardial infarction (HCC)   . Pneumonia 1960   , also 2014  . Quadriparesis (HCC) Aug 22, 2011   pre op  . Shortness of breath dyspnea    with exertion  . Type II diabetes mellitus (HCC)    type 2  . Venous insufficiency     Patient Active Problem List   Diagnosis Date Noted  . Wound dehiscence 03/03/2015  . Lumbar stenosis with neurogenic  claudication 02/02/2015  . Cervical spondylosis with myelopathy 04/17/2014  . Pre-operative cardiovascular examination 04/14/2014  . ICD (implantable cardioverter-defibrillator), biventricular, in situ 11/19/2013  . Cardiac resynchronization therapy defibrillator (CRT-D) in place 07/03/2013  . Chronic renal failure 01/24/2013  . CAD (coronary artery disease) 12/10/2012  . Chronic systolic heart failure 11/20/2012  . Cardiomyopathy, ischemic 11/20/2012  . Atrial flutter (HCC) 11/08/2012  . Dilated cardiomyopathy (HCC) 11/01/2012  . Hypoglycemia 10/29/2012  . Community acquired pneumonia 10/29/2012  . Acute encephalopathy 10/29/2012  . Cervical stenosis of spine 10/28/2011  . Spondylosis of cervical joint 10/28/2011  . Cervical myelopathy (HCC) 10/28/2011  . Quadriparesis (HCC) 10/28/2011  . DVT of right proximal brachial through the distal axillary vein, acute 10/06/2011  . Constipation due to pain medication 10/06/2011  . Venous insufficiency 10/05/2011  . 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 Hyperlipidemia 10/05/2011  . Normocytic anemia 10/03/2011  . Falls frequently 10/03/2011  . Diabetes mellitus, type 2 (HCC) 10/03/2011  . HTN (hypertension) 10/03/2011  . Gait abnormality secondary to lumbar spinal stenosis 10/03/2011  . Arthritis 10/03/2011    Past Surgical History:  Procedure Laterality Date  . ANTERIOR CERVICAL DECOMP/DISCECTOMY FUSION  10/31/2011  Procedure: ANTERIOR CERVICAL DECOMPRESSION/DISCECTOMY FUSION 2 LEVELS;  Surgeon: Cristi LoronJeffrey D Jenkins, MD;  Location: [facility];  Service: Neurosurgery;  Laterality: N/A;  Cervical Four-Five Cervical Five-Six Anterior Cervical Decompression with fusion interbody prothesis plating and bonegraft  . ANTERIOR CERVICAL DECOMP/DISCECTOMY FUSION N/A 04/17/2014   Procedure: CERVICAL THREE TO FOUR ANTERIOR CERVICAL DECOMPRESSION/DISCECTOMY FUSION 1  LEVEL/HARDWARE REMOVAL;  Surgeon: Tressie StalkerJeffrey Jenkins, MD;  Location: [facility];  Service: Neurosurgery;  Laterality: N/A;  C34 anterior cervical decompression with fusion interbody prosthesis plating and bonegraft with exploration of prev fusion and possible removal of old hardware  . BI-VENTRICULAR IMPLANTABLE CARDIOVERTER DEFIBRILLATOR N/A 05/16/2013   Procedure: BI-VENTRICULAR IMPLANTABLE CARDIOVERTER DEFIBRILLATOR  (CRT-D);  Surgeon: Marinus MawGregg W Taylor, MD;  [facility];  Service: Cardiovascular;  Laterality: N/A;  . BI-VENTRICULAR IMPLANTABLE CARDIOVERTER DEFIBRILLATOR  (CRT-D) Left 05/16/2013   STJ CRTD implanted by Dr Ladona Ridgelaylor for primary prevention/CHF  . CARDIAC CATHETERIZATION  10/2012  . CARPAL TUNNEL RELEASE Right ~ 2013  . CATARACT EXTRACTION W/ INTRAOCULAR LENS IMPLANT Bilateral   . JOINT REPLACEMENT  2013  . LEFT AND RIGHT HEART CATHETERIZATION WITH CORONARY ANGIOGRAM N/A 11/06/2012   Procedure: LEFT AND RIGHT HEART CATHETERIZATION WITH CORONARY ANGIOGRAM;  Surgeon: Laurey Moralealton S McLean, MD;  [facility];  Service: Cardiovascular;  Laterality: N/A;  . LUMBAR LAMINECTOMY/DECOMPRESSION MICRODISCECTOMY N/A 02/02/2015   Procedure: LUMBAR LAMINECTOMY/DECOMPRESSION MICRODISCECTOMY 2 LEVELS;  Surgeon: Tressie StalkerJeffrey Jenkins, MD;  Location: [facility];  Service: Neurosurgery;  Laterality: N/A;  L34 L45 laminectomy and foraminotomy  . LUMBAR WOUND DEBRIDEMENT N/A 03/04/2015   Procedure: Incision and Drainage of LUMBAR WOUND ;  Surgeon: Tressie StalkerJeffrey Jenkins, MD;  Location: [facility];  Service: Neurosurgery;  Laterality: N/A;  . 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 toe removal  Left    3rd and 4th toe removed  . TOTAL HIP ARTHROPLASTY Right ~ 2012       Home Medications    Prior to Admission medications   Medication Sig Start Date End Date Taking? Authorizing Provider  acarbose (PRECOSE) 50 MG tablet Take 50 mg by mouth 3 (three) times daily with meals. Take 1  tablet 1-3 times a day    Historical Provider, MD  aspirin EC 81 MG tablet Take 1 tablet (81 mg total) by mouth daily. 12/16/14   Laurey Moralealton S McLean, MD  carvedilol (COREG) 12.5 MG tablet Take 1 tablet (12.5 mg total) by mouth 2 (two) times daily with a meal. 10/13/15   Laurey Moralealton S McLean, MD  cyclobenzaprine (FLEXERIL) 5 MG tablet Take 1 tablet (5 mg total) by mouth 3 (three) times daily as needed for muscle spasms. 02/05/15   Tressie StalkerJeffrey Jenkins, MD  docusate sodium (COLACE) 100 MG capsule Take 300 mg by mouth 2 (two) times daily.     Historical Provider, MD  fluticasone (FLONASE) 50 MCG/ACT nasal spray Place 2 sprays into the nose daily as needed.     Historical Provider, MD  furosemide (LASIX) 40 MG tablet Take 40 mg by mouth every other day.    Historical Provider, MD  gabapentin (NEURONTIN) 300 MG capsule Take 300 mg by mouth 3 (three) times daily. 11/09/12   Christiane Haorinna L Sullivan, MD  glimepiride (AMARYL) 2 MG tablet Take 2 mg by mouth 2 (two) times daily.    Historical Provider, MD  hydrALAZINE (APRESOLINE) 25 MG tablet TAKE ONE-HALF TABLET BY MOUTH THREE TIMES DAILY 10/30/14   Dolores Pattyaniel R Bensimhon, MD  isosorbide mononitrate (IMDUR)  30 MG 24 hr tablet Take 1 tablet (30 mg total) by mouth daily. 09/15/15   Laurey Morale, MD  ivabradine (CORLANOR) 5 MG TABS tablet Take 1 tablet (5 mg total) by mouth 2 (two) times daily with a meal. 10/13/15   Laurey Morale, MD  magnesium hydroxide (MILK OF MAGNESIA) 400 MG/5ML suspension Take 30 mL by mouth daily as needed for constipation.    Historical Provider, MD  metFORMIN (GLUCOPHAGE) 500 MG tablet Take 500 mg by mouth 2 (two) times daily with a meal.     Historical Provider, MD  oxyCODONE-acetaminophen (PERCOCET/ROXICET) 5-325 MG tablet Take 1-2 tablets by mouth every 4 (four) hours as needed for moderate pain. 03/08/15   Loura Halt Ditty, MD  phenylephrine (NEO-SYNEPHRINE) 0.25 % nasal spray Place 1 spray into both nostrils every 6 (six) hours as needed for congestion.  Reported on 10/13/2015    Historical Provider, MD  pravastatin (PRAVACHOL) 40 MG tablet Take 1 tablet (40 mg total) by mouth at bedtime. 01/31/13   Sharon Seller, NP  saw palmetto 500 MG capsule Take 500 mg by mouth 3 (three) times daily.    Historical Provider, MD    Family History Family History  Problem Relation Age of Onset  . Diabetes Brother   . Cancer Father     Lung cancer  . Heart attack Mother     pt thinks she had MI  . Cancer Mother     Social History Social History  Substance Use Topics  . Smoking status: Never Smoker  . Smokeless tobacco: Never Used  . Alcohol use No     Comment: 05/16/2013 "used to drink a little bit a long time ago; nothing in over 30 yrs"     Allergies   Acyclovir and related; Dristan cold [chlorphen-pe-acetaminophen]; and Prednisone   Review of Systems Review of Systems  All other systems reviewed and are negative.    Physical Exam Updated Vital Signs BP 139/69   Pulse 96   Temp 98.3 F (36.8 C) (Oral)   Resp 18   SpO2 100%   Physical Exam  Constitutional: He is oriented to person, place, and time. He appears well-developed and well-nourished.  HENT:  Head: Normocephalic and atraumatic.  Eyes: EOM are normal.  Neck: Normal range of motion.  Cardiovascular: Normal rate, regular rhythm, normal heart sounds and intact distal pulses.   Pulmonary/Chest: Effort normal and breath sounds normal. No respiratory distress.  Abdominal: Soft. He exhibits no distension. There is no tenderness.  Musculoskeletal: Normal range of motion.  Neurological: He is alert and oriented to person, place, and time.  5 out of 5 strength in bilateral upper lower extremity major muscle groups without significant deficit.  Ataxic  Skin: Skin is warm and dry.  Psychiatric: He has a normal mood and affect. Judgment normal.  Nursing note and vitals reviewed.    ED Treatments / Results  Labs (all labs ordered are listed, but only abnormal results are  displayed) Labs Reviewed  CBC - Abnormal; Notable for the following:       Result Value   RBC 3.98 (*)    Hemoglobin 11.9 (*)    HCT 37.5 (*)    All other components within normal limits  COMPREHENSIVE METABOLIC PANEL - Abnormal; Notable for the following:    Glucose, Bld 53 (*)    BUN 25 (*)    Creatinine, Ser 1.41 (*)    ALT 8 (*)    GFR calc non  Af Amer 48 (*)    GFR calc Af Amer 55 (*)    All other components within normal limits  PROTIME-INR  APTT  DIFFERENTIAL  I-STAT TROPOININ, ED  CBG MONITORING, ED  I-STAT CHEM 8, ED    EKG  EKG Interpretation  Date/Time:  Tuesday November 24 2015 17:09:17 EDT Ventricular Rate:  60 PR Interval:  194 QRS Duration: 126 QT Interval:  456 QTC Calculation: 456 R Axis:   -54 Text Interpretation:  AV dual-paced rhythm Abnormal ECG No significant change was found Confirmed by Deaire Mcwhirter  MD, Caryn Bee ([id number]) on 11/24/2015 11:21:25 PM       Radiology Ct Head Wo Contrast  Result Date: 11/24/2015 CLINICAL DATA:  Confusion and aphasia for 2 days. EXAM: CT HEAD WITHOUT CONTRAST TECHNIQUE: Contiguous axial images were obtained from the base of the skull through the vertex without intravenous contrast. COMPARISON:  CT of the head 10/29/2012 FINDINGS: Brain: There is a large acute to subacute infarct in the left PCA territory involving left occipital and temporal lobes with small lacunar infarcts in the left thalamus, likely from occluded perforating branches. There is significant vasogenic edema compressing the posterior horn of the left lateral ventricle. No evidence of hemorrhage, hydrocephalus or mass lesion. Vascular: Calcific atherosclerotic disease at the skullbase. Skull: No acute fractures. Sinuses/Orbits: No acute finding. Other: None. IMPRESSION: Large nonhemorrhagic acute to subacute left PCA territory infarct. Mass effect to the occipital horn of the left lateral ventricle without evidence of frank hydrocephalus. Critical Value/emergent results  were called by telephone at the time of interpretation on 11/24/2015 at 6:30 pm to Dr. Derwood Kaplan , who verbally acknowledged these results. Electronically Signed   By: Ted Mcalpine M.D.   On: 11/24/2015 18:35    Procedures Procedures (including critical care time)  Medications Ordered in ED Medications - No data to display   Initial Impression / Assessment and Plan / ED Course  I have reviewed the triage vital signs and the nursing notes.  Pertinent labs & imaging results that were available during my care of the patient were reviewed by me and considered in my medical decision making (see chart for details).  Clinical Course    Patient be admitted the hospital for new subacute stroke.  I spoke with Dr. Noel Christmas of neurology who will see the patient in consultation.  The patient is not a candidate for MRI given his defibrillator.  His defibrillator will need to be interrogated for possible A. Fib/flutter.  Admit to hospitalist  Final Clinical Impressions(s) / ED Diagnoses   Final diagnoses:  Cerebrovascular accident (CVA), unspecified mechanism (HCC)    New Prescriptions New Prescriptions   No medications on file     Azalia Bilis, MD 11/24/15 2339

## 2015-11-24 NOTE — ED Notes (Addendum)
CGB 164. Notifed Dr. Patria Mane.

## 2015-11-24 NOTE — ED Notes (Signed)
Neurology at bedside.

## 2015-11-24 NOTE — ED Notes (Addendum)
Pt AxO x 4, some aphasia resulting in an NIH = 2.  Next neuro check at 130am.  Pt passed swallow screen.  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 Lajoyce Corners.

## 2015-11-24 NOTE — ED Notes (Signed)
Dr Campos bedside 

## 2015-11-24 NOTE — ED Notes (Signed)
Pt on phone at this time 

## 2015-11-24 NOTE — ED Triage Notes (Signed)
Information per wife.  Onset 2 mornings ago pt woke up sometime that morning, time unknown with confusion and aphasia.  Smile symmetrical, tongue deviates to right, hand grips strong and equal.  Pt got up around 3:30a to go to kitchen and fell.  Wife called EMS but pt was not transported to ED.  EMS assisted up from floor.  A&O to person, not time, place.

## 2015-11-25 ENCOUNTER — Observation Stay (HOSPITAL_COMMUNITY): Payer: Medicare Other

## 2015-11-25 ENCOUNTER — Encounter (HOSPITAL_COMMUNITY): Payer: Self-pay | Admitting: Internal Medicine

## 2015-11-25 DIAGNOSIS — G9341 Metabolic encephalopathy: Secondary | ICD-10-CM | POA: Diagnosis present

## 2015-11-25 DIAGNOSIS — I69322 Dysarthria following cerebral infarction: Secondary | ICD-10-CM | POA: Diagnosis not present

## 2015-11-25 DIAGNOSIS — R29703 NIHSS score 3: Secondary | ICD-10-CM | POA: Diagnosis present

## 2015-11-25 DIAGNOSIS — W19XXXA Unspecified fall, initial encounter: Secondary | ICD-10-CM | POA: Diagnosis present

## 2015-11-25 DIAGNOSIS — I130 Hypertensive heart and chronic kidney disease with heart failure and stage 1 through stage 4 chronic kidney disease, or unspecified chronic kidney disease: Secondary | ICD-10-CM | POA: Diagnosis not present

## 2015-11-25 DIAGNOSIS — R1311 Dysphagia, oral phase: Secondary | ICD-10-CM | POA: Diagnosis not present

## 2015-11-25 DIAGNOSIS — I63432 Cerebral infarction due to embolism of left posterior cerebral artery: Secondary | ICD-10-CM | POA: Diagnosis present

## 2015-11-25 DIAGNOSIS — N183 Chronic kidney disease, stage 3 (moderate): Secondary | ICD-10-CM | POA: Diagnosis not present

## 2015-11-25 DIAGNOSIS — I63332 Cerebral infarction due to thrombosis of left posterior cerebral artery: Secondary | ICD-10-CM

## 2015-11-25 DIAGNOSIS — Z7901 Long term (current) use of anticoagulants: Secondary | ICD-10-CM | POA: Diagnosis not present

## 2015-11-25 DIAGNOSIS — R4701 Aphasia: Secondary | ICD-10-CM | POA: Diagnosis not present

## 2015-11-25 DIAGNOSIS — I63231 Cerebral infarction due to unspecified occlusion or stenosis of right carotid arteries: Secondary | ICD-10-CM | POA: Diagnosis not present

## 2015-11-25 DIAGNOSIS — I63532 Cerebral infarction due to unspecified occlusion or stenosis of left posterior cerebral artery: Secondary | ICD-10-CM | POA: Diagnosis present

## 2015-11-25 DIAGNOSIS — F329 Major depressive disorder, single episode, unspecified: Secondary | ICD-10-CM | POA: Diagnosis present

## 2015-11-25 DIAGNOSIS — I5022 Chronic systolic (congestive) heart failure: Secondary | ICD-10-CM | POA: Diagnosis not present

## 2015-11-25 DIAGNOSIS — I639 Cerebral infarction, unspecified: Secondary | ICD-10-CM | POA: Diagnosis present

## 2015-11-25 DIAGNOSIS — I10 Essential (primary) hypertension: Secondary | ICD-10-CM | POA: Diagnosis not present

## 2015-11-25 DIAGNOSIS — R296 Repeated falls: Secondary | ICD-10-CM | POA: Diagnosis present

## 2015-11-25 DIAGNOSIS — I2510 Atherosclerotic heart disease of native coronary artery without angina pectoris: Secondary | ICD-10-CM | POA: Diagnosis not present

## 2015-11-25 DIAGNOSIS — I872 Venous insufficiency (chronic) (peripheral): Secondary | ICD-10-CM | POA: Diagnosis present

## 2015-11-25 DIAGNOSIS — K219 Gastro-esophageal reflux disease without esophagitis: Secondary | ICD-10-CM | POA: Diagnosis present

## 2015-11-25 DIAGNOSIS — L03032 Cellulitis of left toe: Secondary | ICD-10-CM | POA: Diagnosis not present

## 2015-11-25 DIAGNOSIS — N182 Chronic kidney disease, stage 2 (mild): Secondary | ICD-10-CM

## 2015-11-25 DIAGNOSIS — I638 Other cerebral infarction: Secondary | ICD-10-CM | POA: Diagnosis not present

## 2015-11-25 DIAGNOSIS — R488 Other symbolic dysfunctions: Secondary | ICD-10-CM | POA: Diagnosis not present

## 2015-11-25 DIAGNOSIS — E1122 Type 2 diabetes mellitus with diabetic chronic kidney disease: Secondary | ICD-10-CM | POA: Diagnosis present

## 2015-11-25 DIAGNOSIS — E1142 Type 2 diabetes mellitus with diabetic polyneuropathy: Secondary | ICD-10-CM | POA: Diagnosis present

## 2015-11-25 DIAGNOSIS — Z96641 Presence of right artificial hip joint: Secondary | ICD-10-CM | POA: Diagnosis present

## 2015-11-25 DIAGNOSIS — Z981 Arthrodesis status: Secondary | ICD-10-CM | POA: Diagnosis not present

## 2015-11-25 DIAGNOSIS — E1141 Type 2 diabetes mellitus with diabetic mononeuropathy: Secondary | ICD-10-CM | POA: Diagnosis present

## 2015-11-25 DIAGNOSIS — M6281 Muscle weakness (generalized): Secondary | ICD-10-CM | POA: Diagnosis not present

## 2015-11-25 DIAGNOSIS — I69320 Aphasia following cerebral infarction: Secondary | ICD-10-CM | POA: Diagnosis not present

## 2015-11-25 DIAGNOSIS — E119 Type 2 diabetes mellitus without complications: Secondary | ICD-10-CM | POA: Diagnosis not present

## 2015-11-25 DIAGNOSIS — G936 Cerebral edema: Secondary | ICD-10-CM | POA: Diagnosis present

## 2015-11-25 DIAGNOSIS — N184 Chronic kidney disease, stage 4 (severe): Secondary | ICD-10-CM | POA: Diagnosis present

## 2015-11-25 DIAGNOSIS — I4892 Unspecified atrial flutter: Secondary | ICD-10-CM | POA: Diagnosis not present

## 2015-11-25 DIAGNOSIS — R471 Dysarthria and anarthria: Secondary | ICD-10-CM | POA: Diagnosis present

## 2015-11-25 DIAGNOSIS — I350 Nonrheumatic aortic (valve) stenosis: Secondary | ICD-10-CM | POA: Diagnosis present

## 2015-11-25 DIAGNOSIS — L03116 Cellulitis of left lower limb: Secondary | ICD-10-CM | POA: Diagnosis not present

## 2015-11-25 DIAGNOSIS — I429 Cardiomyopathy, unspecified: Secondary | ICD-10-CM | POA: Diagnosis present

## 2015-11-25 DIAGNOSIS — E785 Hyperlipidemia, unspecified: Secondary | ICD-10-CM | POA: Diagnosis present

## 2015-11-25 DIAGNOSIS — Z95810 Presence of automatic (implantable) cardiac defibrillator: Secondary | ICD-10-CM | POA: Diagnosis not present

## 2015-11-25 LAB — COMPREHENSIVE METABOLIC PANEL
ALBUMIN: 3.8 g/dL (ref 3.5–5.0)
ALK PHOS: 56 U/L (ref 38–126)
ALT: 10 U/L — ABNORMAL LOW (ref 17–63)
AST: 17 U/L (ref 15–41)
Anion gap: 8 (ref 5–15)
BILIRUBIN TOTAL: 0.7 mg/dL (ref 0.3–1.2)
BUN: 27 mg/dL — AB (ref 6–20)
CALCIUM: 9.7 mg/dL (ref 8.9–10.3)
CO2: 27 mmol/L (ref 22–32)
CREATININE: 1.41 mg/dL — AB (ref 0.61–1.24)
Chloride: 103 mmol/L (ref 101–111)
GFR calc Af Amer: 55 mL/min — ABNORMAL LOW (ref >60)
GFR calc non Af Amer: 48 mL/min — ABNORMAL LOW (ref >60)
GLUCOSE: 121 mg/dL — AB (ref 65–99)
Potassium: 4 mmol/L (ref 3.5–5.1)
Sodium: 138 mmol/L (ref 135–145)
TOTAL PROTEIN: 7.1 g/dL (ref 6.5–8.1)

## 2015-11-25 LAB — GLUCOSE, CAPILLARY
GLUCOSE-CAPILLARY: 108 mg/dL — AB (ref 65–99)
GLUCOSE-CAPILLARY: 130 mg/dL — AB (ref 65–99)
GLUCOSE-CAPILLARY: 126 mg/dL — AB (ref 65–99)
GLUCOSE-CAPILLARY: 111 mg/dL — AB (ref 65–99)
Glucose-Capillary: 120 mg/dL — ABNORMAL HIGH (ref 65–99)

## 2015-11-25 LAB — LIPID PANEL
Cholesterol: 124 mg/dL (ref 0–200)
HDL: 18 mg/dL — ABNORMAL LOW (ref >40)
LDL CALC: 82 mg/dL (ref 0–99)
TRIGLYCERIDES: 118 mg/dL (ref <150)
Total CHOL/HDL Ratio: 6.9 RATIO
VLDL: 24 mg/dL (ref 0–40)

## 2015-11-25 LAB — CBC
HEMATOCRIT: 35.2 % — AB (ref 39.0–52.0)
HEMOGLOBIN: 11.2 g/dL — AB (ref 13.0–17.0)
MCH: 29.8 pg (ref 26.0–34.0)
MCHC: 31.8 g/dL (ref 30.0–36.0)
MCV: 93.6 fL (ref 78.0–100.0)
Platelets: 223 10*3/uL (ref 150–400)
RBC: 3.76 MIL/uL — ABNORMAL LOW (ref 4.22–5.81)
RDW: 14 % (ref 11.5–15.5)
WBC: 6.5 10*3/uL (ref 4.0–10.5)

## 2015-11-25 LAB — SEDIMENTATION RATE: Sed Rate: 33 mm/hr — ABNORMAL HIGH (ref 0–16)

## 2015-11-25 MED ORDER — IOPAMIDOL (ISOVUE-370) INJECTION 76%
INTRAVENOUS | Status: AC
  Administered 2015-11-25: 50 mL
  Filled 2015-11-25: qty 50

## 2015-11-25 MED ORDER — VANCOMYCIN HCL IN DEXTROSE 1-5 GM/200ML-% IV SOLN: 1000.0000 mg | INTRAVENOUS | Status: DC

## 2015-11-25 MED ORDER — DOXYCYCLINE HYCLATE 100 MG PO TABS
100.0000 mg | ORAL_TABLET | Freq: Two times a day (BID) | ORAL | Status: DC
  Administered 2015-11-25 – 2015-11-27 (×5): 100 mg via ORAL
  Filled 2015-11-25 (×5): qty 1

## 2015-11-25 MED ORDER — ASPIRIN 300 MG RE SUPP: 300.0000 mg | Freq: Every day | RECTAL | Status: DC

## 2015-11-25 MED ORDER — SAW PALMETTO (SERENOA REPENS) 500 MG PO CAPS: 1350.0000 mg | ORAL_CAPSULE | Freq: Three times a day (TID) | ORAL | Status: DC

## 2015-11-25 MED ORDER — DOCUSATE SODIUM 100 MG PO CAPS
300.0000 mg | ORAL_CAPSULE | Freq: Two times a day (BID) | ORAL | Status: DC
Start: 2015-11-25 — End: 2015-11-28
  Administered 2015-11-25 – 2015-11-27 (×6): 300 mg via ORAL
  Filled 2015-11-25 (×8): qty 3

## 2015-11-25 MED ORDER — STROKE: EARLY STAGES OF RECOVERY BOOK
Freq: Once | Status: AC
  Administered 2015-11-25: 06:00:00
  Filled 2015-11-25: qty 1

## 2015-11-25 MED ORDER — HYDRALAZINE HCL 25 MG PO TABS
37.5000 mg | ORAL_TABLET | Freq: Three times a day (TID) | ORAL | Status: DC
  Administered 2015-11-25 – 2015-11-28 (×8): 37.5 mg via ORAL
  Filled 2015-11-25 (×8): qty 2

## 2015-11-25 MED ORDER — FLUTICASONE PROPIONATE 50 MCG/ACT NA SUSP
2.0000 | Freq: Every day | NASAL | Status: DC | PRN
  Administered 2015-11-25 – 2015-11-28 (×4): 2 via NASAL
  Filled 2015-11-25: qty 16

## 2015-11-25 MED ORDER — ISOSORBIDE MONONITRATE ER 30 MG PO TB24
30.0000 mg | ORAL_TABLET | Freq: Every day | ORAL | Status: DC
  Administered 2015-11-25 – 2015-11-27 (×3): 30 mg via ORAL
  Filled 2015-11-25 (×4): qty 1

## 2015-11-25 MED ORDER — CARVEDILOL 12.5 MG PO TABS
12.5000 mg | ORAL_TABLET | Freq: Two times a day (BID) | ORAL | Status: DC
  Administered 2015-11-25 – 2015-11-27 (×6): 12.5 mg via ORAL
  Filled 2015-11-25 (×7): qty 1

## 2015-11-25 MED ORDER — HYDROCODONE-ACETAMINOPHEN 5-325 MG PO TABS: 2.0000 | ORAL_TABLET | Freq: Three times a day (TID) | ORAL | Status: DC | PRN

## 2015-11-25 MED ORDER — PRAVASTATIN SODIUM 40 MG PO TABS: 40.0000 mg | ORAL_TABLET | Freq: Every day | ORAL | Status: DC

## 2015-11-25 MED ORDER — ENOXAPARIN SODIUM 40 MG/0.4ML ~~LOC~~ SOLN
40.0000 mg | SUBCUTANEOUS | Status: DC
  Administered 2015-11-25 – 2015-11-26 (×2): 40 mg via SUBCUTANEOUS
  Filled 2015-11-25 (×2): qty 0.4

## 2015-11-25 MED ORDER — GABAPENTIN 300 MG PO CAPS
300.0000 mg | ORAL_CAPSULE | Freq: Three times a day (TID) | ORAL | Status: DC
  Administered 2015-11-25 – 2015-11-27 (×8): 300 mg via ORAL
  Filled 2015-11-25 (×9): qty 1

## 2015-11-25 MED ORDER — VANCOMYCIN HCL 10 G IV SOLR
2000.0000 mg | Freq: Once | INTRAVENOUS | Status: AC
  Administered 2015-11-25: 2000 mg via INTRAVENOUS
  Filled 2015-11-25: qty 2000

## 2015-11-25 MED ORDER — ACARBOSE 50 MG PO TABS
50.0000 mg | ORAL_TABLET | Freq: Three times a day (TID) | ORAL | Status: DC
  Administered 2015-11-25 – 2015-11-27 (×8): 50 mg via ORAL
  Filled 2015-11-25 (×12): qty 1

## 2015-11-25 MED ORDER — IVABRADINE HCL 5 MG PO TABS
5.0000 mg | ORAL_TABLET | Freq: Two times a day (BID) | ORAL | Status: DC
  Administered 2015-11-25 – 2015-11-27 (×6): 5 mg via ORAL
  Filled 2015-11-25 (×8): qty 1

## 2015-11-25 MED ORDER — ASPIRIN 325 MG PO TABS
325.0000 mg | ORAL_TABLET | Freq: Every day | ORAL | Status: DC
  Administered 2015-11-25 – 2015-11-26 (×2): 325 mg via ORAL
  Filled 2015-11-25 (×2): qty 1

## 2015-11-25 MED ORDER — INSULIN ASPART 100 UNIT/ML ~~LOC~~ SOLN
0.0000 [IU] | Freq: Three times a day (TID) | SUBCUTANEOUS | Status: DC
  Administered 2015-11-25 (×2): 1 [IU] via SUBCUTANEOUS
  Administered 2015-11-26 (×2): 2 [IU] via SUBCUTANEOUS
  Administered 2015-11-27 (×2): 3 [IU] via SUBCUTANEOUS
  Administered 2015-11-27: 1 [IU] via SUBCUTANEOUS

## 2015-11-25 MED ORDER — ROSUVASTATIN CALCIUM 10 MG PO TABS
10.0000 mg | ORAL_TABLET | Freq: Every day | ORAL | Status: DC
  Administered 2015-11-25 – 2015-11-27 (×3): 10 mg via ORAL
  Filled 2015-11-25 (×4): qty 1

## 2015-11-25 MED ORDER — GLIMEPIRIDE 2 MG PO TABS: 2.0000 mg | ORAL_TABLET | Freq: Two times a day (BID) | ORAL | Status: DC

## 2015-11-25 NOTE — Progress Notes (Signed)
Pharmacy Antibiotic Note  [name] is a 75 y.o. male admitted on 11/24/2015 with CVA, also with infected toe, awaiting amputation .  Pharmacy has been consulted for Vancomycin dosing.  Plan: Vancomycin 2 g IV now, then 1 g IV q24h   Temp (24hrs), Avg:98.3 F (36.8 C), Min:98.3 F (36.8 C), Max:98.3 F (36.8 C)   Recent Labs Lab 11/24/15 1719  WBC 8.0  CREATININE 1.41*    CrCl cannot be calculated (Unknown ideal weight.).    Allergies  Allergen Reactions  . Acyclovir And Related Other (See Comments)    unknown  . Dristan Cold [Chlorphen-Pe-Acetaminophen] Anxiety    Makes pt feel "wired up"  . Prednisone Other (See Comments)    Messes with blood sugar levels    Eddie Candle 11/25/2015 2:56 AM

## 2015-11-25 NOTE — Progress Notes (Signed)
STROKE TEAM PROGRESS NOTE   HISTORY OF PRESENT ILLNESS (per record) [name] is an 75 y.o. male history diabetes mellitus, hypertension, hyperlipidemia, coronary artery disease, CHF and AICD placement, presenting with speech abnormality for 2 days. Patient has had word finding difficulty as well as nonsensical speech output. Patient reportedly is an avid reader and has been unable to read over the past 2 days. No focal weaknesses been noted. He said no facial droop. No dysarthria was noticed. There's no previous history of stroke or TIA. He's been taking aspirin daily. CT scan of his head showed a large acute left PCA territory ischemic stroke. NIH stroke score was 3. He was LKW 11/22/2015, time unknown. Patient was not administered IV t-PA secondary to being beyond time window for treatment consideration. He was admitted for further evaluation and treatment.   SUBJECTIVE (INTERVAL HISTORY) His wife is at the bedside.  She says he has problems with his memory. His wife has no recollection of abnormal heart rhythm.   OBJECTIVE Temp:  [97.8 F (36.6 C)-98.7 F (37.1 C)] 97.8 F (36.6 C) (08/23 0823) Pulse Rate:  [63-99] 73 (08/23 0823) Cardiac Rhythm: Ventricular paced (08/23 0700) Resp:  [12-20] 18 (08/23 0823) BP: (115-156)/(60-78) 130/69 (08/23 0823) SpO2:  [93 %-100 %] 100 % (08/23 0823) Weight:  [84.2 kg (185 lb 9.6 oz)] 84.2 kg (185 lb 9.6 oz) (08/23 0341)  CBC:   Recent Labs Lab 11/24/15 1719 11/25/15 0327  WBC 8.0 6.5  NEUTROABS 5.1  --   HGB 11.9* 11.2*  HCT 37.5* 35.2*  MCV 94.2 93.6  PLT 270 223    Basic Metabolic Panel:   Recent Labs Lab 11/24/15 1719 11/25/15 0343  NA 137 138  K 4.3 4.0  CL 104 103  CO2 25 27  GLUCOSE 53* 121*  BUN 25* 27*  CREATININE 1.41* 1.41*  CALCIUM 9.8 9.7    Lipid Panel:     Component Value Date/Time   CHOL 124 11/25/2015 0328   TRIG 118 11/25/2015 0328   HDL 18 (L) 11/25/2015 0328   CHOLHDL 6.9 11/25/2015 0328    VLDL 24 11/25/2015 0328   LDLCALC 82 11/25/2015 0328   HgbA1c:  Lab Results  Component Value Date   HGBA1C 5.9 (H) 01/29/2015   Urine Drug Screen: No results found for: LABOPIA, COCAINSCRNUR, LABBENZ, AMPHETMU, THCU, LABBARB    IMAGING  Ct Head Wo Contrast  Result Date: 11/24/2015 CLINICAL DATA:  Confusion and aphasia for 2 days. EXAM: CT HEAD WITHOUT CONTRAST TECHNIQUE: Contiguous axial images were obtained from the base of the skull through the vertex without intravenous contrast. COMPARISON:  CT of the head 10/29/2012 FINDINGS: Brain: There is a large acute to subacute infarct in the left PCA territory involving left occipital and temporal lobes with small lacunar infarcts in the left thalamus, likely from occluded perforating branches. There is significant vasogenic edema compressing the posterior horn of the left lateral ventricle. No evidence of hemorrhage, hydrocephalus or mass lesion. Vascular: Calcific atherosclerotic disease at the skullbase. Skull: No acute fractures. Sinuses/Orbits: No acute finding. Other: None. IMPRESSION: Large nonhemorrhagic acute to subacute left PCA territory infarct. Mass effect to the occipital horn of the left lateral ventricle without evidence of frank hydrocephalus. Critical Value/emergent results were called by telephone at the time of interpretation on 11/24/2015 at 6:30 pm to Dr. Derwood Kaplan , who verbally acknowledged these results. Electronically Signed   By: Ted Mcalpine M.D.   On: 11/24/2015 18:35   Dg Foot  2 Views Left  Result Date: 11/25/2015 CLINICAL DATA:  Cellulitis of left second toe for weeks. EXAM: LEFT FOOT - 2 VIEW COMPARISON:  None. FINDINGS: No erosion or joint destruction to suggest acute osteomyelitis. Third and fourth digital amputations. Hammertoe deformities of the second and fifth digits. Degenerative spurring about the first MTP joint with possible gouty erosion medially in the metatarsal. Osteopenia. Arterial  calcification. Dorsal midfoot and calcaneal spurring. IMPRESSION: 1. No suspected osteomyelitis. 2. Possible gouty erosion in the first metatarsal head. Electronically Signed   By: Marnee SpringJonathon  Watts M.D.   On: 11/25/2015 03:18       PHYSICAL EXAM Pleasant elderly caucasian male not in distress. . Afebrile. Head is nontraumatic. Neck is supple without bruit.    Cardiac exam no murmur or gallop. Lungs are clear to auscultation. Distal pulses are not well felt due to wearing pull-ups in both legs for infected toe. Neurological Exam :  Awake alert oriented 2. Dementia tension, registration and recall. Poor insight into his illness. Left gaze preference. Can look to the right past midline but not all the way. Dense right homonymous hemianopsia. Pupils irregular equal reactive. Fundi were not visualized. Vision acuity seems adequate. Tongue midline. Motor system exam reveals no upper or lower extremity drift but diminished fine finger movements on the right. Mild weakness of right grip and orbits left-to-right approximately. Mild paraparesis with weakness proximally and distally of both lower extremities 3/5. ASSESSMENT/PLAN [name] is a 75 y.o. male with history of diabetes mellitus, hypertension, hyperlipidemia, coronary artery disease, CHF and AICD placement presenting with abnormal speech output x 2 days. He did not receive IV t-PA due to beyond time window.   Stroke:  Left PCA infarct felt to be embolic in pt with history of atrial flutter  Resultant  R HH, poor short term memory  MRI  / MRA  AICD  CTA head & neck pending   2D Echo  pending   LDL 82  HgbA1c pending  Lovenox 40 mg sq daily for VTE prophylaxis Diet heart healthy/carb modified Room service appropriate? Yes; Fluid consistency: Thin  aspirin 81 mg daily prior to admission, now on aspirin 325 mg daily  Patient counseled to be compliant with his antithrombotic medications  Ongoing aggressive stroke risk factor  management  Therapy recommendations:  HH OT  Disposition:  pending   Atrial Flutter  Listed on problem list - per note from Dr. Shirlee LatchMcLean 12/16/2014, pt had intermittent atrial flutter during hospitalization felt to be from Milrinone.  Home anticoagulation:  none   Hypertension  Stable  Permissive hypertension (OK if < 220/120) but gradually normalize in 5-7 days  Long-term BP goal normotensive  Hyperlipidemia  Home meds:  pravachol 40 mg daily, resumed in hospital  LDL 82, goal < 70  Continue statin at discharge  Diabetes type II  HgbA1c pending, goal < 7.0  Other Stroke Risk Factors  Advanced age  Coronary artery disease  Chronic systolic heart failure (hx EF 16%->10-96%20%->40-45% in July)  Other Active Problems  Infected toe (L 2nd toe), awaiting amputation, on Vancomycin  Chronic kidney disease stage III  Hospital day # 0  Rhoderick MoodyBIBY,SHARON  [facility] See Amion for Pager information 11/25/2015 11:24 AM  I have personally examined this patient, reviewed notes, independently viewed imaging studies, participated in medical decision making and plan of care. I have made any additions or clarifications directly to the above note. Agree with note above. He has presented with confusion, disorientation  and vision defects due to a large left posterior cerebral artery infarct likely of embolic etiology and source to be determined. She remains at risk for neurological worsening, recurrent stroke, TIA needs ongoing stroke evaluation. I had a long discussion the patient and wife at bedside and answered questions. Greater than 50% time during this 35 minute visit was spent on counseling and coordination of care about stroke risk, prevention and treatment he may need TEE and loop recorder insertion for detection of atrial fibrillation unless this is confirmed in his history  Delia Heady, MD Medical Director [facility] Pager: [phone] 11/25/2015 5:05  PM    To contact Stroke Continuity provider, please refer to [email or web address]. After hours, contact General Neurology

## 2015-11-25 NOTE — Evaluation (Signed)
Occupational Therapy Evaluation Patient Details Name: [name] MRN: [id number] DOB: Nov 19, 1940 Today's Date: 11/25/2015    History of Present Illness 75 y.o. male with CAD, chronic systolic heart failure status post AICD placement, chronic kidney disease stage 3-4, diabetes mellitus type 2 was referred to the ER after patient was found to have difficulty bringing out words. Patient has been experiencing difficulty speaking words for last 2 days. CT on 8/22 showed a large acute to subacute infarct in the left PCA    Clinical Impression   Pt unable to provide home setup or PLOF at this time; reports "[name] been away so long, I cant remember". No family present to provide information. Currently pt is overall min assist for ADL and functional mobility with verbal cues throughout for sequencing and safety. Pt presenting with R visual field deficits, impaired cognition, and decreased balance in standing impacting his independence and safety with ADL and functional mobility. Recommending HHOT for follow up if pt is able to have 24/7 supervision upon return home. If pt does not have 24/7 supervision, will need SNF. Pt would benefit from continued skilled OT to address established goals.    Follow Up Recommendations  Home health OT;Supervision/Assistance - 24 hour (If family unable to provide 24/7 S, will need SNF)    Equipment Recommendations  Other (comment) (TBD)    Recommendations for Other Services PT consult;Speech consult     Precautions / Restrictions Restrictions Weight Bearing Restrictions: No      Mobility Bed Mobility Overal bed mobility: Needs Assistance Bed Mobility: Supine to Sit;Sit to Supine     Supine to sit: Min guard Sit to supine: Min guard   General bed mobility comments: Cues for sequencing. No physical assist required. Increased time needed.  Transfers Overall transfer level: Needs assistance Equipment used: None Transfers: Sit to/from Stand Sit to  Stand: Min guard         General transfer comment: Min guard for sit to stand from EOB. Min assist for side stepping to reposition in bed.    Balance Overall balance assessment: Needs assistance Sitting-balance support: Feet supported;No upper extremity supported Sitting balance-Leahy Scale: Good     Standing balance support: No upper extremity supported;During functional activity Standing balance-Leahy Scale: Poor                              ADL Overall ADL's : Needs assistance/impaired Eating/Feeding: Set up;Supervision/ safety;Sitting   Grooming: Set up;Supervision/safety;Sitting;Cueing for sequencing;Cueing for safety   Upper Body Bathing: Set up;Supervision/ safety;Sitting   Lower Body Bathing: Minimal assistance;Sit to/from stand   Upper Body Dressing : Set up;Supervision/safety;Sitting   Lower Body Dressing: Minimal assistance;Sit to/from stand   Toilet Transfer: Minimal Chartered loss adjuster Details (indicate cue type and reason): Simulated by sit to stand with side stepping to reposition in bed. Toileting- Clothing Manipulation and Hygiene: Minimal assistance;Sit to/from stand       Functional mobility during ADLs: Minimal assistance (for basic transfers only) General ADL Comments: Pt reports he is very tired and declined OOB activities today; agreeable to reposition in bed. Pt unable to provide PLOF information.     Vision Vision Assessment?: Yes Eye Alignment: Within Functional Limits Ocular Range of Motion: Within Functional Limits Tracking/Visual Pursuits: Able to track stimulus in all quads without difficulty Visual Fields: Right visual field deficit   Perception     Praxis      Pertinent Vitals/Pain Pain Assessment: No/denies  pain     Hand Dominance Left   Extremity/Trunk Assessment Upper Extremity Assessment Upper Extremity Assessment: Overall WFL for tasks assessed   Lower Extremity Assessment Lower  Extremity Assessment: Defer to PT evaluation   Cervical / Trunk Assessment Cervical / Trunk Assessment: Normal   Communication Communication Communication: HOH   Cognition Arousal/Alertness: Awake/alert Behavior During Therapy: WFL for tasks assessed/performed Overall Cognitive Status: No family/caregiver present to determine baseline cognitive functioning Area of Impairment: Orientation;Memory;Following commands;Problem solving;Safety/judgement Orientation Level: Disoriented to;Place;Time;Situation   Memory: Decreased short-term memory Following Commands: Follows one step commands consistently Safety/Judgement: Decreased awareness of deficits;Decreased awareness of safety   Problem Solving: Slow processing;Difficulty sequencing;Requires verbal cues General Comments: No family present to determine baseline. Pt continuously reports "Ive been away so long, I cant remember"   General Comments       Exercises       Shoulder Instructions      Home Living Family/patient expects to be discharged to:: Private residence                                 Additional Comments: Pt reports "it has been so long since Ive been home I cannot remember" when discussing home set up and PLOF information. No family present to provide.      Prior Functioning/Environment          Comments: Pt unable to provide    OT Diagnosis: Cognitive deficits;Disturbance of vision;Altered mental status   OT Problem List: Decreased activity tolerance;Impaired balance (sitting and/or standing);Impaired vision/perception;Decreased cognition;Decreased safety awareness;Decreased knowledge of use of DME or AE;Decreased knowledge of precautions   OT Treatment/Interventions: Self-care/ADL training;Neuromuscular education;Energy conservation;Therapeutic activities;Visual/perceptual remediation/compensation;Cognitive remediation/compensation;Patient/family education;Balance training    OT Goals(Current  goals can be found in the care plan section) Acute Rehab OT Goals Patient Stated Goal: take a nap OT Goal Formulation: With patient Time For Goal Achievement: 12/09/15 Potential to Achieve Goals: Good ADL Goals Pt Will Perform Grooming: with supervision;standing Pt Will Perform Upper Body Bathing: with supervision;sitting Pt Will Perform Lower Body Bathing: with supervision;sit to/from stand Pt Will Transfer to Toilet: with supervision;ambulating;regular height toilet Pt Will Perform Toileting - Clothing Manipulation and hygiene: with supervision;sit to/from stand  OT Frequency: Min 2X/week   Barriers to D/C:            Co-evaluation              End of Session Nurse Communication: Mobility status  Activity Tolerance: Patient limited by fatigue Patient left: in bed;with call bell/phone within reach;with bed alarm set   Time: [id number] OT Time Calculation (min): 14 min Charges:  OT General Charges $OT Visit: 1 Procedure OT Evaluation $OT Eval Moderate Complexity: 1 Procedure G-Codes: OT G-codes **NOT FOR INPATIENT CLASS** Functional Assessment Tool Used: Clinical judgement Functional Limitation: Self care Self Care Current Status (G4010(G8987): At least 1 percent but less than 20 percent impaired, limited or restricted Self Care Goal Status (U7253(G8988): At least 1 percent but less than 20 percent impaired, limited or restricted   Gaye AlkenBailey A Marshun Duva M.S., OTR/L Pager: [phone]  11/25/2015, 9:45 AM

## 2015-11-25 NOTE — Progress Notes (Signed)
OT Cancellation Note  Patient Details Name: RATHANA HERING MRN: [id number] DOB: Oct 06, 1940   Cancelled Treatment:    Reason Eval/Treat Not Completed: Other (comment) (pt eating breakfast). Will follow up for OT eval as time allows.  Gaye Alken M.S., OTR/L Pager: [phone]  11/25/2015, 8:08 AM

## 2015-11-25 NOTE — Evaluation (Signed)
Speech Language Pathology Evaluation Patient Details Name: [name] MRN: [id number] DOB: 09-26-1940 Today's Date: 11/25/2015 Time: 2233-6122 SLP Time Calculation (min) (ACUTE ONLY): 27 min  Problem List:  Patient Active Problem List   Diagnosis Date Noted  . Acute left PCA stroke (HCC) 11/25/2015  . Stroke (cerebrum) (HCC) 11/25/2015  . Wound dehiscence 03/03/2015  . Lumbar stenosis with neurogenic claudication 02/02/2015  . Cervical spondylosis with myelopathy 04/17/2014  . Pre-operative cardiovascular examination 04/14/2014  . ICD (implantable cardioverter-defibrillator), biventricular, in situ 11/19/2013  . Cardiac resynchronization therapy defibrillator (CRT-D) in place 07/03/2013  . Chronic renal failure 01/24/2013  . CAD (coronary artery disease) 12/10/2012  . Chronic systolic heart failure (HCC) 11/20/2012  . Cardiomyopathy, ischemic 11/20/2012  . Atrial flutter (HCC) 11/08/2012  . Dilated cardiomyopathy (HCC) 11/01/2012  . Hypoglycemia 10/29/2012  . Community acquired pneumonia 10/29/2012  . Acute encephalopathy 10/29/2012  . Cervical stenosis of spine 10/28/2011  . Spondylosis of cervical joint 10/28/2011  . Cervical myelopathy (HCC) 10/28/2011  . Quadriparesis (HCC) 10/28/2011  . DVT of right proximal brachial through the distal axillary vein, acute 10/06/2011  . Constipation due to pain medication 10/06/2011  . Venous insufficiency 10/05/2011  . 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 Hyperlipidemia 10/05/2011  . Normocytic anemia 10/03/2011  . Falls frequently 10/03/2011  . Diabetes mellitus, type 2 (HCC) 10/03/2011  . HTN (hypertension) 10/03/2011  . Gait abnormality secondary to lumbar spinal stenosis 10/03/2011  . Arthritis 10/03/2011   Past Medical History:  Past Medical History:  Diagnosis Date  . Acute encephalopathy   . AICD (automatic cardioverter/defibrillator)  present   . Anemia   . Arthritis    "all over" (05/16/2013)  . Atrial flutter (HCC)   . CAD (coronary artery disease)   . Cardiomyopathy (HCC)    a. 10/2012 Echo: EF 20-25%, diff HK, mod dil LA/RV/RA.  Marland Kitchen Cervical myelopathy (HCC)   . Cervical spinal stenosis   . Chronic systolic congestive heart failure (HCC)   . CKD (chronic kidney disease), stage III   . 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 Depression    "son died in service in 08-14-1990; still bothers me" (05/16/2013)  . DVT (deep venous thrombosis) (HCC) 10/2011   RUE; pt denies this hx on 05/16/2013  . Dysrhythmia   . Frequent falls   . GERD (gastroesophageal reflux disease)   . H/O hiatal hernia   . Hyperlipidemia   . Hypertension   . Hypoglycemia   . Lumbar spinal stenosis   . Mononeuritis of unspecified site   . Myocardial infarction (HCC)   . Pneumonia 1960   , also 2014  . Quadriparesis (HCC) 2011-08-14   pre op  . Shortness of breath dyspnea    with exertion  . Type II diabetes mellitus (HCC)    type 2  . Venous insufficiency    Past Surgical History:  Past Surgical History:  Procedure Laterality Date  . ANTERIOR CERVICAL DECOMP/DISCECTOMY FUSION  10/31/2011   Procedure: ANTERIOR CERVICAL DECOMPRESSION/DISCECTOMY FUSION 2 LEVELS;  Surgeon: Cristi Loron, MD;  Location: [facility];  Service: Neurosurgery;  Laterality: N/A;  Cervical Four-Five Cervical Five-Six Anterior Cervical Decompression with fusion interbody prothesis plating and bonegraft  . ANTERIOR CERVICAL DECOMP/DISCECTOMY FUSION N/A 04/17/2014   Procedure: CERVICAL THREE TO FOUR ANTERIOR CERVICAL DECOMPRESSION/DISCECTOMY FUSION 1 LEVEL/HARDWARE REMOVAL;  Surgeon:  Tressie Stalker, MD;  Location: [facility];  Service: Neurosurgery;  Laterality: N/A;  C34 anterior cervical decompression with fusion interbody prosthesis plating and bonegraft with exploration of prev fusion and  possible removal of old hardware  . BI-VENTRICULAR IMPLANTABLE CARDIOVERTER DEFIBRILLATOR N/A 05/16/2013   Procedure: BI-VENTRICULAR IMPLANTABLE CARDIOVERTER DEFIBRILLATOR  (CRT-D);  Surgeon: Marinus Maw, MD;  Location: [facility] CATH LAB;  Service: Cardiovascular;  Laterality: N/A;  . BI-VENTRICULAR IMPLANTABLE CARDIOVERTER DEFIBRILLATOR  (CRT-D) Left 05/16/2013   STJ CRTD implanted by Dr Ladona Ridgel for primary prevention/CHF  . CARDIAC CATHETERIZATION  10/2012  . CARPAL TUNNEL RELEASE Right ~ 2013  . CATARACT EXTRACTION W/ INTRAOCULAR LENS IMPLANT Bilateral   . JOINT REPLACEMENT  2013  . LEFT AND RIGHT HEART CATHETERIZATION WITH CORONARY ANGIOGRAM N/A 11/06/2012   Procedure: LEFT AND RIGHT HEART CATHETERIZATION WITH CORONARY ANGIOGRAM;  Surgeon: Laurey Morale, MD;  Location: [facility] CATH LAB;  Service: Cardiovascular;  Laterality: N/A;  . LUMBAR LAMINECTOMY/DECOMPRESSION MICRODISCECTOMY N/A 02/02/2015   Procedure: LUMBAR LAMINECTOMY/DECOMPRESSION MICRODISCECTOMY 2 LEVELS;  Surgeon: Tressie Stalker, MD;  Location: [facility];  Service: Neurosurgery;  Laterality: N/A;  L34 L45 laminectomy and foraminotomy  . LUMBAR WOUND DEBRIDEMENT N/A 03/04/2015   Procedure: Incision and Drainage of LUMBAR WOUND ;  Surgeon: Tressie Stalker, MD;  Location: [facility];  Service: Neurosurgery;  Laterality: N/A;  . 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 toe removal  Left    3rd and 4th toe removed  . TOTAL HIP ARTHROPLASTY Right ~ 2012   HPI:  Pt is a 75 y.o. male with a PMH of CAD, chronic systolic heart failure status post AICD placement, chronic kidney disease stage 3-4, and diabetes mellitus type 2. Pt has been experiencing difficulty speaking words for the past 2 days. CT of the head shows left side stroke with possible mass effect.   Assessment / Plan / Recommendation Clinical Impression  Pt appeared confused upon SLP arrival. Pt was reluctant to participate in SLE, but with continual  reminders and cueing pt participated. Pt showed mod-max impairment of sustained attention, working memory, and emergent awareness during tasks. Pt's verbal output was characterized by phonemic and semantic paraphasias. Pt had impaired auditory comprehension for following 1 step and complex commands, but was capable of repetitions. Recommend aphasia and cog tx.    SLP Assessment  Patient needs continued Speech Lanaguage Pathology Services    Follow Up Recommendations  Home health SLP;24 hour supervision/assistance    Frequency and Duration min 2x/week  2 weeks      SLP Evaluation Prior Functioning  Cognitive/Linguistic Baseline: Information not available Type of Home: House  Lives With: Spouse Available Help at Discharge: Family   Cognition  Overall Cognitive Status: No family/caregiver present to determine baseline cognitive functioning Arousal/Alertness: Awake/alert Orientation Level: Disoriented to place;Disoriented to time;Disoriented to situation;Oriented to person Attention: Focused;Sustained Focused Attention: Appears intact Sustained Attention: Impaired Sustained Attention Impairment: Verbal basic Memory: Impaired Memory Impairment: Storage deficit;Retrieval deficit;Decreased short term memory;Decreased recall of new information Decreased Short Term Memory: Functional basic;Verbal basic Awareness: Impaired Awareness Impairment: Emergent impairment;Anticipatory impairment Problem Solving: Impaired Problem Solving Impairment: Verbal basic;Functional basic Executive Function: Reasoning Reasoning: Impaired Reasoning Impairment: Verbal basic Behaviors: Impulsive;Verbal agitation;Perseveration Safety/Judgment: Impaired    Comprehension  Auditory Comprehension Overall Auditory Comprehension: Impaired Yes/No Questions: Not tested Commands: Impaired One Step Basic Commands: 50-74% accurate Complex Commands: 0-24% accurate Conversation: Simple Interfering Components:  Attention;Hearing;Working memory EffectiveTechniques: Slowed speech;Pausing;Increased volume;Extra processing time Visual  Recognition/Discrimination Discrimination: Not tested Reading Comprehension Reading Status: Not tested    Expression Expression Primary Mode of Expression: Verbal Verbal Expression Overall Verbal Expression: Impaired Initiation: No impairment Automatic Speech: Name;Social Response Level of Generative/Spontaneous Verbalization: Conversation Repetition: No impairment Naming: Not tested Pragmatics: Impairment Impairments: Topic appropriateness;Topic maintenance Interfering Components: Attention;Speech intelligibility Non-Verbal Means of Communication: Not applicable Written Expression Dominant Hand: Left Written Expression: Not tested   Oral / Motor  Oral Motor/Sensory Function Overall Oral Motor/Sensory Function: Within functional limits (through observation) Motor Speech Overall Motor Speech: Appears within functional limits for tasks assessed   GO          Functional Assessment Tool Used: skilled clinical judgment Functional Limitations: Memory Memory Current Status (W0981(G9168): At least 60 percent but less than 80 percent impaired, limited or restricted Memory Goal Status ([id number](G9169): At least 40 percent but less than 60 percent impaired, limited or restricted         Tollie EthHaleigh Ragan Shirrell Solinger, Student SLP  Caryl NeverHaleigh R Mckennon Zwart 11/25/2015, 4:48 PM

## 2015-11-25 NOTE — Evaluation (Signed)
Physical Therapy Evaluation Patient Details Name: [name] MRN: [id number] DOB: 1941-03-24 Today's Date: 11/25/2015   History of Present Illness  75 y.o. male with CAD, chronic systolic heart failure status post AICD placement, chronic kidney disease stage 3-4, diabetes mellitus type 2 was referred to the ER after patient was found to have difficulty bringing out words. Patient has been experiencing difficulty speaking words for last 2 days. CT on 8/22 showed a large acute to subacute infarct in the left PCA   Clinical Impression  Pt admitted with/for difficulty speaking, CT showed large acute/subacute left PCA infarct..  Pt currently limited functionally due to the problems listed below.  (see problems list.)  Pt will benefit from PT to maximize function and safety to be able to get home safely with available assist of family.     Follow Up Recommendations Home health PT;Other (comment) (vs SNF if no up to 24 hour assist)    Equipment Recommendations   (TBA)    Recommendations for Other Services       Precautions / Restrictions Precautions Precautions: Fall Restrictions Weight Bearing Restrictions: No      Mobility  Bed Mobility Overal bed mobility: Needs Assistance Bed Mobility: Supine to Sit     Supine to sit: Min guard Sit to supine: Min guard   General bed mobility comments: Cues for sequencing. No physical assist required. Increased time needed.  Transfers Overall transfer level: Needs assistance Equipment used: None;1 person hand held assist Transfers: Sit to/from Stand Sit to Stand: Min guard         General transfer comment: Min guard for sit to stand from EOB. Used the back of his legs and UE for stability.  Ambulation/Gait Ambulation/Gait assistance: Min assist Ambulation Distance (Feet): 12 Feet (the 12 back to bed from bathroom, then 80 feet with IV pole) Assistive device: 1 person hand held assist (iv pole  will try RW next treatment) Gait  Pattern/deviations: Step-through pattern;Decreased step length - right;Decreased step length - left;Decreased stride length;Trunk flexed Gait velocity: slower Gait velocity interpretation: Below normal speed for age/gender General Gait Details: generally unsteady and mildly R paretic steps with flexed posture improved holdingto IV pole, but never fully upright or fully steady.  Stairs            Wheelchair Mobility    Modified Rankin (Stroke Patients Only) Modified Rankin (Stroke Patients Only) Pre-Morbid Rankin Score: No significant disability Modified Rankin: Moderately severe disability     Balance Overall balance assessment: Needs assistance Sitting-balance support: Feet supported;No upper extremity supported Sitting balance-Leahy Scale: Good Sitting balance - Comments: can accept minimal challenge to balance.   Standing balance support: No upper extremity supported;During functional activity Standing balance-Leahy Scale: Poor Standing balance comment: reliant on UE's                             Pertinent Vitals/Pain Pain Assessment: No/denies pain    Home Living Family/patient expects to be discharged to:: Private residence Living Arrangements: Spouse/significant other Available Help at Discharge: Family Type of Home: House Home Access: Stairs to enter Entrance Stairs-Rails: Right Entrance Stairs-Number of Steps: 2 Home Layout: Multi-level;Able to live on main level with bedroom/bathroom Home Equipment: Walker - 4 wheels;Bedside commode;Wheelchair - manual;Hospital bed;Hand held Careers information officershower head;Walker - 2 wheels;Grab bars - tub/shower;Shower seat Additional Comments: Still have not corroborated the home living with his wife.  Pt unable to remember anything.    Prior Function  Level of Independence: Needs assistance   Gait / Transfers Assistance Needed: SPC, RW  ADL's / Homemaking Assistance Needed: Wife assisted with all ADLs and IADLs as needed while  recovering from back surgery performed on 02/02/15  Comments: Pt unable to provide,  Wife not in room to corroborate PLOF     Hand Dominance   Dominant Hand: Left    Extremity/Trunk Assessment   Upper Extremity Assessment: Defer to OT evaluation           Lower Extremity Assessment: RLE deficits/detail RLE Deficits / Details: mild proximal weakness including quads.    Cervical / Trunk Assessment: Normal  Communication   Communication: HOH  Cognition Arousal/Alertness: Awake/alert Behavior During Therapy: WFL for tasks assessed/performed Overall Cognitive Status: No family/caregiver present to determine baseline cognitive functioning Area of Impairment: Orientation;Memory;Following commands;Problem solving;Safety/judgement Orientation Level: Disoriented to;Place;Time;Situation   Memory: Decreased short-term memory Following Commands: Follows one step commands consistently Safety/Judgement: Decreased awareness of deficits;Decreased awareness of safety   Problem Solving: Slow processing;Difficulty sequencing;Requires verbal cues General Comments: No family present to determine baseline. Pt continuously reports "Ive been away so long, I cant remember"    General Comments General comments (skin integrity, edema, etc.): Unable to get back PLOF and environmental questions answered, due to memory loss.    Exercises        Assessment/Plan    PT Assessment Patient needs continued PT services  PT Diagnosis Abnormality of gait;Generalized weakness (mild paresis R side)   PT Problem List Decreased strength;Decreased activity tolerance;Decreased balance;Decreased mobility;Decreased coordination;Decreased knowledge of use of DME  PT Treatment Interventions DME instruction;Gait training;Functional mobility training;Therapeutic activities;Balance training;Patient/family education;Neuromuscular re-education   PT Goals (Current goals can be found in the Care Plan section) Acute  Rehab PT Goals Patient Stated Goal: take a nap Time For Goal Achievement: 12/09/15 Potential to Achieve Goals: Good    Frequency Min 3X/week   Barriers to discharge        Co-evaluation               End of Session   Activity Tolerance: Patient tolerated treatment well;Patient limited by fatigue Patient left: in chair;with chair alarm set;with call bell/phone within reach Nurse Communication: Mobility status    Functional Assessment Tool Used: clinical judgement Functional Limitation: Mobility: Walking and moving around Mobility: Walking and Moving Around Current Status (G8676): At least 20 percent but less than 40 percent impaired, limited or restricted Mobility: Walking and Moving Around Goal Status [phone]): At least 1 percent but less than 20 percent impaired, limited or restricted    Time: 1236-1302 PT Time Calculation (min) (ACUTE ONLY): 26 min   Charges:   PT Evaluation $PT Eval Moderate Complexity: 1 Procedure PT Treatments $Gait Training: 8-22 mins   PT G Codes:   PT G-Codes **NOT FOR INPATIENT CLASS** Functional Assessment Tool Used: clinical judgement Functional Limitation: Mobility: Walking and moving around Mobility: Walking and Moving Around Current Status (T2671): At least 20 percent but less than 40 percent impaired, limited or restricted Mobility: Walking and Moving Around Goal Status [phone]): At least 1 percent but less than 20 percent impaired, limited or restricted    Isla Sabree, Eliseo Gum 11/25/2015, 1:15 PM  11/25/2015  [place] Bing, PT [phone]  (pager)

## 2015-11-25 NOTE — ED Notes (Signed)
Pt became agitated due to monitor wires tangled in clothing. Entered room to find pt sitting on the end of the bed, pt had taken monitor off. Pt repositioned in bed and placed back on monitor. RN notified, pt slightly altered to person, place, time.

## 2015-11-25 NOTE — Care Management Note (Signed)
Case Management Note  Patient Details  Name: [name] MRN: [id number] Date of Birth: 01-23-1941  Subjective/Objective:   Pt admitted with CVA. He also is on IV Vanc for toes infection. He is from home.                  Action/Plan: OT recommendation is for [facility] services with 24 hour supervision. Awaiting PT recommendation. CM following for discharge disposition.   Expected Discharge Date:                  Expected Discharge Plan:  Home w Home Health Services  In-House Referral:     Discharge planning Services     Post Acute Care Choice:    Choice offered to:     DME Arranged:    DME Agency:     HH Arranged:    HH Agency:     Status of Service:  In process, will continue to follow  If discussed at Long Length of Stay Meetings, dates discussed:    Additional Comments:  Kermit Balo, RN 11/25/2015, 11:27 AM

## 2015-11-25 NOTE — ED Notes (Addendum)
Pt alert to self.

## 2015-11-25 NOTE — Progress Notes (Addendum)
PROGRESS NOTE  [name]  JQZ:[id number] DOB: [birth year] DOA: 11/24/2015 PCP: Maggie Font, MD  Brief Narrative:   [name] is a 75 y.o. male with CAD, chronic systolic heart failure status post AICD placement, chronic kidney disease stage 3-4, diabetes mellitus type 2 was referred to the ER after patient was found to have difficulty bringing out words. Patient had experienced difficulty speaking words for 2 days prior to admission. Patient had followed up with this orthopedic surgeon Dr. Sharol Given for his left foot second toe discoloration and discharge. Over there patient was found to be having difficulty speaking and was referred to the ER. CT of the head shows stroke with possible mass effect. On-call neurologist Dr. Nicole Kindred has been consulted. Patient has been admitted for further stroke workup. On exam patient also has left foot second toe discoloration. Patient denies any difficulty swallowing blurred vision or moving upper or lower extremities.    Assessment & Plan:   Principal Problem:   Acute left PCA stroke (HCC) Active Problems:   Diabetes mellitus, type 2 (HCC)   HTN (hypertension)   Chronic systolic heart failure (HCC)   CAD (coronary artery disease)   ICD (implantable cardioverter-defibrillator), biventricular, in situ   Stroke (cerebrum) (HCC)  Acute left PCA stroke with some mild dysarthria and expressive aphasia -  Unable to get MRI due to pacemaker -  CT angio head and neck pending -  Echocardiogram -  Continue aspirin -  LDL 82 > change to high dose rosuvastatin -  A1c pending -  PT/OT recommending 24h assistance.  Wife at home -  SLP eval pending -  Appreciate neurology assistance -  Tele:  Biventricularly paced  Left foot second toe infection -  ESR 33 -  Discontinue vancomycin and start oral doxycycline  Diabetes mellitus type 2 -  F/u A1c -  Hold SU and metformin -  Continue SSI  Chronic systolic heart failure status post AICD  placement -  Continue to hold lasix due to contrast exposure today with CT -  Continue carvedilol -  No ARB/ACEI due to CKD -  Continue ivabradine -  Continue imdur/hydralazine  CAD, stable, continue aspirin, BB, change to high dose statin  Hypertension -  Continue carvedilol, hydralazine  CKD stage 3, creatinine at baseline of 1.4 -  Minimize nephrotoxins and renally dose medications   DVT prophylaxis:  Lovenox Code Status:  Full code Family Communication:  Wife at bedside Disposition Plan:  Possibly home tomorrow   Consultants:   Neurology  Procedures:  none  Antimicrobials:   None    Subjective: Intermittent confusion. He continues to have some word finding difficulties. Sometimes when he is speaking the wrong sounds and words come out of his mouth. Feels that he understands what is spoken to him. Denies focal numbness, tingling, or weakness which are new. He has chronic mild numbness of his hands and feet from peripheral neuropathy.  Objective: Vitals:   11/25/15 0215 11/25/15 0341 11/25/15 0552 11/25/15 0823  BP: 134/66 136/68 115/60 130/69  Pulse: 83 76 66 73  Resp: _0 Temp:  98.7 F (37.1 C)  97.8 F (36.6 C)  TempSrc:  Oral  Oral  SpO2: 99% 100%  100%  Weight:  84.2 kg (185 lb 9.6 oz)    Height:  5' 10" (1.778 m)      Intake/Output Summary (Last 24 hours) at 11/25/15 1351 Last data filed at 11/25/15 0758  Gross per 24  hour  Intake                0 ml  Output              700 ml  Net             -700 ml   Filed Weights   11/25/15 0341  Weight: 84.2 kg (185 lb 9.6 oz)    Examination:  General exam:  Adult Male.  No acute distress.  HEENT:  NCAT, MMM Respiratory system: Clear to auscultation bilaterally Cardiovascular system: Regular rate and rhythm, normal S1/S2.  Slight heave. 2-6 systolic murmur at the left sternal border.  Warm extremities Gastrointestinal system: Normal active bowel sounds, soft, nondistended, nontender. MSK:   Normal tone and bulk, no lower extremity edema, mild dark erythema of bilateral feet, second toe of the right foot and several toes on the left foot.  No discrete cellulitis  Neuro:  Cranial nerves II through XII grossly intact. No obvious facial droop. Occasionally slow to answer questions, but no obvious word finding difficulties during my exam. Strength 5 out of 5 throughout, sensation intact to light touch on arms and legs    Data Reviewed: I have personally reviewed following labs and imaging studies  CBC:  Recent Labs Lab 11/24/15 1719 11/25/15 0327  WBC 8.0 6.5  NEUTROABS 5.1  --   HGB 11.9* 11.2*  HCT 37.5* 35.2*  MCV 94.2 93.6  PLT 270 132   Basic Metabolic Panel:  Recent Labs Lab 11/24/15 1719 11/25/15 0343  NA 137 138  K 4.3 4.0  CL 104 103  CO2 25 27  GLUCOSE 53* 121*  BUN 25* 27*  CREATININE 1.41* 1.41*  CALCIUM 9.8 9.7   GFR: Estimated Creatinine Clearance: 47.5 mL/min (by C-G formula based on SCr of 1.41 mg/dL). Liver Function Tests:  Recent Labs Lab 11/24/15 1719 11/25/15 0343  AST 19 17  ALT 8* 10*  ALKPHOS 58 56  BILITOT 0.4 0.7  PROT 7.4 7.1  ALBUMIN 4.0 3.8   No results for input(s): LIPASE, AMYLASE in the last 168 hours. No results for input(s): AMMONIA in the last 168 hours. Coagulation Profile:  Recent Labs Lab 11/24/15 1719  INR 1.10   Cardiac Enzymes: No results for input(s): CKTOTAL, CKMB, CKMBINDEX, TROPONINI in the last 168 hours. BNP (last 3 results) No results for input(s): PROBNP in the last 8760 hours. HbA1C: No results for input(s): HGBA1C in the last 72 hours. CBG:  Recent Labs Lab 11/24/15 2213 11/24/15 2341 11/25/15 0350 11/25/15 0759 11/25/15 1143  GLUCAP 75 164* 120* 108* 130*   Lipid Profile:  Recent Labs  11/25/15 0328  CHOL 124  HDL 18*  LDLCALC 82  TRIG 118  CHOLHDL 6.9   Thyroid Function Tests: No results for input(s): TSH, T4TOTAL, FREET4, T3FREE, THYROIDAB in the last 72 hours. Anemia  Panel: No results for input(s): VITAMINB12, FOLATE, FERRITIN, TIBC, IRON, RETICCTPCT in the last 72 hours. Urine analysis:    Component Value Date/Time   COLORURINE YELLOW 03/13/2015 0653   APPEARANCEUR CLEAR 03/13/2015 0653   LABSPEC 1.014 03/13/2015 0653   PHURINE 6.0 03/13/2015 0653   GLUCOSEU NEGATIVE 03/13/2015 0653   HGBUR NEGATIVE 03/13/2015 0653   BILIRUBINUR NEGATIVE 03/13/2015 0653   KETONESUR NEGATIVE 03/13/2015 0653   PROTEINUR NEGATIVE 03/13/2015 0653   UROBILINOGEN 0.2 10/29/2012 1316   NITRITE NEGATIVE 03/13/2015 0653   LEUKOCYTESUR NEGATIVE 03/13/2015 0653   Sepsis Labs: _0 (procalcitonin:4,lacticidven:4)  )No results found for this  or any previous visit (from the past 240 hour(s)).    Radiology Studies: Ct Head Wo Contrast  Result Date: 11/24/2015 CLINICAL DATA:  Confusion and aphasia for 2 days. EXAM: CT HEAD WITHOUT CONTRAST TECHNIQUE: Contiguous axial images were obtained from the base of the skull through the vertex without intravenous contrast. COMPARISON:  CT of the head 10/29/2012 FINDINGS: Brain: There is a large acute to subacute infarct in the left PCA territory involving left occipital and temporal lobes with small lacunar infarcts in the left thalamus, likely from occluded perforating branches. There is significant vasogenic edema compressing the posterior horn of the left lateral ventricle. No evidence of hemorrhage, hydrocephalus or mass lesion. Vascular: Calcific atherosclerotic disease at the skullbase. Skull: No acute fractures. Sinuses/Orbits: No acute finding. Other: None. IMPRESSION: Large nonhemorrhagic acute to subacute left PCA territory infarct. Mass effect to the occipital horn of the left lateral ventricle without evidence of frank hydrocephalus. Critical Value/emergent results were called by telephone at the time of interpretation on 11/24/2015 at 6:30 pm to Dr. Varney Biles , who verbally acknowledged these results. Electronically Signed    By: Fidela Salisbury M.D.   On: 11/24/2015 18:35   Dg Foot 2 Views Left  Result Date: 11/25/2015 CLINICAL DATA:  Cellulitis of left second toe for weeks. EXAM: LEFT FOOT - 2 VIEW COMPARISON:  None. FINDINGS: No erosion or joint destruction to suggest acute osteomyelitis. Third and fourth digital amputations. Hammertoe deformities of the second and fifth digits. Degenerative spurring about the first MTP joint with possible gouty erosion medially in the metatarsal. Osteopenia. Arterial calcification. Dorsal midfoot and calcaneal spurring. IMPRESSION: 1. No suspected osteomyelitis. 2. Possible gouty erosion in the first metatarsal head. Electronically Signed   By: Monte Fantasia M.D.   On: 11/25/2015 03:18     Scheduled Meds: . acarbose  50 mg Oral TID WC  . aspirin  300 mg Rectal Daily   Or  . aspirin  325 mg Oral Daily  . carvedilol  12.5 mg Oral BID WC  . docusate sodium  300 mg Oral BID  . enoxaparin (LOVENOX) injection  40 mg Subcutaneous Q24H  . gabapentin  300 mg Oral TID  . hydrALAZINE  37.5 mg Oral Q8H  . insulin aspart  0-9 Units Subcutaneous TID WC  . isosorbide mononitrate  30 mg Oral Daily  . ivabradine  5 mg Oral BID WC  . pravastatin  40 mg Oral QHS  . [START ON 11/26/2015] vancomycin  1,000 mg Intravenous Q24H   Continuous Infusions:    LOS: 0 days    Time spent: 30 min    Janece Canterbury, MD [facility] Pager [phone]  If 7PM-7AM, please contact night-coverage [email or web address] Password [email or web address] 11/25/2015, 1:51 PM

## 2015-11-25 NOTE — Consult Note (Signed)
Admission H&P    Chief Complaint: Abnormal speech output for 2 days.  HPI: [name] is an 75 y.o. male history diabetes mellitus, hypertension, hyperlipidemia, coronary artery disease, CHF and AICD placement, presenting with speech abnormality for 2 days. Patient has had word finding difficulty as well as nonsensical speech output. Patient reportedly is an avid reader and has been unable to read over the past 2 days. No focal weaknesses been noted. He said no facial droop. No dysarthria was noticed. There's no previous history of stroke or TIA. He's been taking aspirin daily. CT scan of his head showed a large acute left PCA territory ischemic stroke. NIH stroke score was 3.  LSN: 11/22/2015 tPA Given: No: Beyond time window for treatment consideration mRankin:  Past Medical History:  Diagnosis Date  . Acute encephalopathy   . AICD (automatic cardioverter/defibrillator) present   . Anemia   . Arthritis    "all over" (05/16/2013)  . Atrial flutter ([place])   . CAD (coronary artery disease)   . Cardiomyopathy ([place])    a. 10/2012 Echo: EF 20-25%, diff HK, mod dil LA/RV/RA.  Marland Kitchen Cervical myelopathy ([place])   . Cervical spinal stenosis   . Chronic systolic congestive heart failure ([place])   . CKD (chronic kidney disease), stage III   . 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 Depression    "son died in service in 06/18/1990; still bothers me" (05/16/2013)  . DVT (deep venous thrombosis) ([place]) 10/2011   RUE; pt denies this hx on 05/16/2013  . Dysrhythmia   . Frequent falls   . GERD (gastroesophageal reflux disease)   . H/O hiatal hernia   . Hyperlipidemia   . Hypertension   . Hypoglycemia   . Lumbar spinal stenosis   . Mononeuritis of unspecified site   . Myocardial infarction ([place])   . Pneumonia 06/18/1958   , also 06/18/2012  . Quadriparesis ([place]) 2011/06/19   pre op  . Shortness of breath dyspnea    with exertion  . Type II  diabetes mellitus ([place])    type 2  . Venous insufficiency     Past Surgical History:  Procedure Laterality Date  . ANTERIOR CERVICAL DECOMP/DISCECTOMY FUSION  10/31/2011   Procedure: ANTERIOR CERVICAL DECOMPRESSION/DISCECTOMY FUSION 2 LEVELS;  Surgeon: Ophelia Charter, MD;  Location: [place] NEURO ORS;  Service: Neurosurgery;  Laterality: N/A;  Cervical Four-Five Cervical Five-Six Anterior Cervical Decompression with fusion interbody prothesis plating and bonegraft  . ANTERIOR CERVICAL DECOMP/DISCECTOMY FUSION N/A 04/17/2014   Procedure: CERVICAL THREE TO FOUR ANTERIOR CERVICAL DECOMPRESSION/DISCECTOMY FUSION 1 LEVEL/HARDWARE REMOVAL;  Surgeon: Newman Pies, MD;  Location: [place] NEURO ORS;  Service: Neurosurgery;  Laterality: N/A;  C34 anterior cervical decompression with fusion interbody prosthesis plating and bonegraft with exploration of prev fusion and possible removal of old hardware  . BI-VENTRICULAR IMPLANTABLE CARDIOVERTER DEFIBRILLATOR N/A 05/16/2013   Procedure: BI-VENTRICULAR IMPLANTABLE CARDIOVERTER DEFIBRILLATOR  (CRT-D);  Surgeon: Evans Lance, MD;  [facility] CATH LAB;  Service: Cardiovascular;  Laterality: N/A;  . BI-VENTRICULAR IMPLANTABLE CARDIOVERTER DEFIBRILLATOR  (CRT-D) Left 05/16/2013   STJ CRTD implanted by Dr Lovena Le for primary prevention/CHF  . CARDIAC CATHETERIZATION  10/2012  . CARPAL TUNNEL RELEASE Right ~ 06-19-2011  . CATARACT EXTRACTION W/ INTRAOCULAR LENS IMPLANT Bilateral   . JOINT REPLACEMENT  06/19/11  . LEFT AND RIGHT HEART CATHETERIZATION WITH CORONARY ANGIOGRAM N/A 11/06/2012   Procedure: LEFT AND RIGHT  HEART CATHETERIZATION WITH CORONARY ANGIOGRAM;  Surgeon: Larey Dresser, MD;  Location: [facility] CATH LAB;  Service: Cardiovascular;  Laterality: N/A;  . LUMBAR LAMINECTOMY/DECOMPRESSION MICRODISCECTOMY N/A 02/02/2015   Procedure: LUMBAR LAMINECTOMY/DECOMPRESSION MICRODISCECTOMY 2 LEVELS;  Surgeon: Newman Pies, MD;  Location: [place] NEURO ORS;  Service: Neurosurgery;   Laterality: N/A;  L34 L45 laminectomy and foraminotomy  . LUMBAR WOUND DEBRIDEMENT N/A 03/04/2015   Procedure: Incision and Drainage of LUMBAR WOUND ;  Surgeon: Newman Pies, MD;  Location: [place] NEURO ORS;  Service: Neurosurgery;  Laterality: N/A;  . 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 toe removal  Left    3rd and 4th toe removed  . TOTAL HIP ARTHROPLASTY Right ~ 2012    Family History  Problem Relation Age of Onset  . Diabetes Brother   . Cancer Father     Lung cancer  . Heart attack Mother     pt thinks she had MI  . Cancer Mother    Social History:  reports that he has never smoked. He has never used smokeless tobacco. He reports that he does not drink alcohol or use drugs.  Allergies:  Allergies  Allergen Reactions  . Acyclovir And Related   . Dristan Cold [Chlorphen-Pe-Acetaminophen] Anxiety    Makes pt feel "wired up"  . Prednisone Other (See Comments)    Messes with blood sugar levels     Medications: Preadmission medications were reviewed by me.  ROS: History obtained from spouse and the patient  General ROS: negative for - chills, fatigue, fever, night sweats, weight gain or weight loss Psychological ROS: negative for - behavioral disorder, hallucinations, memory difficulties, mood swings or suicidal ideation Ophthalmic ROS: negative for - blurry vision, double vision, eye pain or loss of vision ENT ROS: negative for - epistaxis, nasal discharge, oral lesions, sore throat, tinnitus or vertigo Allergy and Immunology ROS: negative for - hives or itchy/watery eyes Hematological and Lymphatic ROS: negative for - bleeding problems, bruising or swollen lymph nodes Endocrine ROS: negative for - galactorrhea, hair pattern changes, polydipsia/polyuria or temperature intolerance Respiratory ROS: negative for - cough, hemoptysis, shortness of breath or wheezing Cardiovascular ROS: negative for - chest pain, dyspnea on exertion, edema or irregular  heartbeat Gastrointestinal ROS: negative for - abdominal pain, diarrhea, hematemesis, nausea/vomiting or stool incontinence Genito-Urinary ROS: negative for - dysuria, hematuria, incontinence or urinary frequency/urgency Musculoskeletal ROS: Chronic neck and back pain Neurological ROS: as noted in HPI Dermatological ROS: negative for rash and skin lesion changes  Physical Examination: Blood pressure 156/78, pulse 99, temperature 98.3 F (36.8 C), resp. rate 16, SpO2 100 %.  HEENT-  Normocephalic, no lesions, without obvious abnormality.  Normal external eye and conjunctiva.  Normal TM's bilaterally.  Normal auditory canals and external ears. Normal external nose, mucus membranes and septum.  Normal pharynx. Neck supple with no masses, nodes, nodules or enlargement. Cardiovascular - regular rate and rhythm, S1, S2 normal, no murmur, click, rub or gallop Lungs - chest clear, no wheezing, rales, normal symmetric air entry Abdomen - soft, non-tender; bowel sounds normal; no masses,  no organomegaly Extremities - no joint deformities, effusion, or inflammation  Neurologic Examination: Mental Status: Alert, oriented, thought content appropriate.  Moderate receptive and expressive aphasia. Able to follow commands without difficulty. Cranial Nerves: II-dense right homonymous hemianopsia. III/IV/VI-Pupils were equal and reacted normally to light. Extraocular movements were full and conjugate.    V/VII-no facial numbness and no facial weakness. VIII-normal. X-no dysarthria; symmetrical palatal  movement. XI: trapezius strength/neck flexion strength normal bilaterally XII-midline tongue extension with normal strength. Motor: 5/5 bilaterally with normal tone and bulk Sensory: Normal throughout. Deep Tendon Reflexes: 1+ and symmetric in upper extremities and absent in lower extremities. Plantars: Mute bilaterally Cerebellar: Normal finger-to-nose testing. Carotid auscultation: Normal  Results  for orders placed or performed during the hospital encounter of 11/24/15 (from the past 48 hour(s))  Protime-INR     Status: None   Collection Time: 11/24/15  5:19 PM  Result Value Ref Range   Prothrombin Time 14.2 11.4 - 15.2 seconds   INR 1.10   APTT     Status: None   Collection Time: 11/24/15  5:19 PM  Result Value Ref Range   aPTT 32 24 - 36 seconds  CBC     Status: Abnormal   Collection Time: 11/24/15  5:19 PM  Result Value Ref Range   WBC 8.0 4.0 - 10.5 K/uL   RBC 3.98 (L) 4.22 - 5.81 MIL/uL   Hemoglobin 11.9 (L) 13.0 - 17.0 g/dL   HCT 37.5 (L) 39.0 - 52.0 %   MCV 94.2 78.0 - 100.0 fL   MCH 29.9 26.0 - 34.0 pg   MCHC 31.7 30.0 - 36.0 g/dL   RDW 14.0 11.5 - 15.5 %   Platelets 270 150 - 400 K/uL  Differential     Status: None   Collection Time: 11/24/15  5:19 PM  Result Value Ref Range   Neutrophils Relative % 64 %   Neutro Abs 5.1 1.7 - 7.7 K/uL   Lymphocytes Relative 24 %   Lymphs Abs 1.9 0.7 - 4.0 K/uL   Monocytes Relative 9 %   Monocytes Absolute 0.7 0.1 - 1.0 K/uL   Eosinophils Relative 3 %   Eosinophils Absolute 0.3 0.0 - 0.7 K/uL   Basophils Relative 0 %   Basophils Absolute 0.0 0.0 - 0.1 K/uL  Comprehensive metabolic panel     Status: Abnormal   Collection Time: 11/24/15  5:19 PM  Result Value Ref Range   Sodium 137 135 - 145 mmol/L   Potassium 4.3 3.5 - 5.1 mmol/L   Chloride 104 101 - 111 mmol/L   CO2 25 22 - 32 mmol/L   Glucose, Bld 53 (L) 65 - 99 mg/dL   BUN 25 (H) 6 - 20 mg/dL   Creatinine, Ser 1.41 (H) 0.61 - 1.24 mg/dL   Calcium 9.8 8.9 - 10.3 mg/dL   Total Protein 7.4 6.5 - 8.1 g/dL   Albumin 4.0 3.5 - 5.0 g/dL   AST 19 15 - 41 U/L   ALT 8 (L) 17 - 63 U/L   Alkaline Phosphatase 58 38 - 126 U/L   Total Bilirubin 0.4 0.3 - 1.2 mg/dL   GFR calc non Af Amer 48 (L) >60 mL/min   GFR calc Af Amer 55 (L) >60 mL/min    Comment: (NOTE) The eGFR has been calculated using the CKD EPI equation. This calculation has not been validated in all clinical  situations. eGFR's persistently <60 mL/min signify possible Chronic Kidney Disease.    Anion gap 8 5 - 15  I-stat troponin, ED     Status: None   Collection Time: 11/24/15  5:43 PM  Result Value Ref Range   Troponin i, poc 0.00 0.00 - 0.08 ng/mL   Comment 3            Comment: Due to the release kinetics of cTnI, a negative result within the first hours of the onset  of symptoms does not rule out myocardial infarction with certainty. If myocardial infarction is still suspected, repeat the test at appropriate intervals.   CBG monitoring, ED     Status: None   Collection Time: 11/24/15 10:13 PM  Result Value Ref Range   Glucose-Capillary 75 65 - 99 mg/dL  CBG monitoring, ED     Status: Abnormal   Collection Time: 11/24/15 11:41 PM  Result Value Ref Range   Glucose-Capillary 164 (H) 65 - 99 mg/dL   Ct Head Wo Contrast  Result Date: 11/24/2015 CLINICAL DATA:  Confusion and aphasia for 2 days. EXAM: CT HEAD WITHOUT CONTRAST TECHNIQUE: Contiguous axial images were obtained from the base of the skull through the vertex without intravenous contrast. COMPARISON:  CT of the head 10/29/2012 FINDINGS: Brain: There is a large acute to subacute infarct in the left PCA territory involving left occipital and temporal lobes with small lacunar infarcts in the left thalamus, likely from occluded perforating branches. There is significant vasogenic edema compressing the posterior horn of the left lateral ventricle. No evidence of hemorrhage, hydrocephalus or mass lesion. Vascular: Calcific atherosclerotic disease at the skullbase. Skull: No acute fractures. Sinuses/Orbits: No acute finding. Other: None. IMPRESSION: Large nonhemorrhagic acute to subacute left PCA territory infarct. Mass effect to the occipital horn of the left lateral ventricle without evidence of frank hydrocephalus. Critical Value/emergent results were called by telephone at the time of interpretation on 11/24/2015 at 6:30 pm to Dr. Varney Biles , who verbally acknowledged these results. Electronically Signed   By: Fidela Salisbury M.D.   On: 11/24/2015 18:35    Assessment: 75 y.o. male with multiple risk factors for stroke presenting with acute large left PCA ischemic infarction.  Stroke Risk Factors - diabetes mellitus, hyperlipidemia and hypertension  Plan: 1. HgbA1c, fasting lipid panel 2. CT angiogram of head and neck with contrast 3. PT consult, OT consult, Speech consult 4. Echocardiogram 5. Prophylactic therapy-Antiplatelet med: Aspirin  6. Risk factor modification 7. Telemetry monitoring  C.R. Nicole Kindred, MD  [facility] [phone]  11/25/2015, 12:21 AM

## 2015-11-25 NOTE — ED Notes (Signed)
Pt displaying some anger towards wife and staff. Pt non violent. Pt agitated.

## 2015-11-25 NOTE — H&P (Addendum)
History and Physical    [name] ZOX:[id number] DOB: 08-13-1940 DOA: 11/24/2015  PCP: Evlyn CourierGerald K Hill, MD  Patient coming from: Home.  Chief Complaint: Difficulty speaking.  HPI: [name] is a 75 y.o. male with CAD, chronic systolic heart failure status post AICD placement, chronic kidney disease stage 3-4, diabetes mellitus type 2 was referred to the ER after patient was found to have difficulty bringing out words. Patient has been experiencing difficulty speaking words for last 2 days. Patient had followed up with this orthopedic surgeon Dr. Lajoyce Cornersuda for his left foot second toe discoloration and discharge. Over there patient was found to be having difficulty speaking and was referred to the ER. CT of the head shows stroke with possible mass effect. On-call neurologist Dr. Roseanne RenoStewart has been consulted. Patient has been admitted for further stroke workup. On exam patient also has left foot second toe discoloration. Patient denies any difficulty swallowing blurred vision or moving upper or lower extremities.   ED Course: CT head shows stroke.  Review of Systems: As per HPI, rest all negative.   Past Medical History:  Diagnosis Date  . Acute encephalopathy   . AICD (automatic cardioverter/defibrillator) present   . Anemia   . Arthritis    "all over" (05/16/2013)  . Atrial flutter (HCC)   . CAD (coronary artery disease)   . Cardiomyopathy (HCC)    a. 10/2012 Echo: EF 20-25%, diff HK, mod dil LA/RV/RA.  Marland Kitchen. Cervical myelopathy (HCC)   . Cervical spinal stenosis   . Chronic systolic congestive heart failure (HCC)   . CKD (chronic kidney disease), stage III   . 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 Depression    "son died in service in 1992; still bothers me" (05/16/2013)  . DVT (deep venous thrombosis) (HCC) 10/2011   RUE; pt denies this hx on 05/16/2013  . Dysrhythmia   . Frequent falls   .  GERD (gastroesophageal reflux disease)   . H/O hiatal hernia   . Hyperlipidemia   . Hypertension   . Hypoglycemia   . Lumbar spinal stenosis   . Mononeuritis of unspecified site   . Myocardial infarction (HCC)   . Pneumonia 1960   , also 2014  . Quadriparesis (HCC) 2013   pre op  . Shortness of breath dyspnea    with exertion  . Type II diabetes mellitus (HCC)    type 2  . Venous insufficiency     Past Surgical History:  Procedure Laterality Date  . ANTERIOR CERVICAL DECOMP/DISCECTOMY FUSION  10/31/2011   Procedure: ANTERIOR CERVICAL DECOMPRESSION/DISCECTOMY FUSION 2 LEVELS;  Surgeon: Cristi LoronJeffrey D Jenkins, MD;  Location: [facility];  Service: Neurosurgery;  Laterality: N/A;  Cervical Four-Five Cervical Five-Six Anterior Cervical Decompression with fusion interbody prothesis plating and bonegraft  . ANTERIOR CERVICAL DECOMP/DISCECTOMY FUSION N/A 04/17/2014   Procedure: CERVICAL THREE TO FOUR ANTERIOR CERVICAL DECOMPRESSION/DISCECTOMY FUSION 1 LEVEL/HARDWARE REMOVAL;  Surgeon: Tressie StalkerJeffrey Jenkins, MD;  Location: [facility];  Service: Neurosurgery;  Laterality: N/A;  C34 anterior cervical decompression with fusion interbody prosthesis plating and bonegraft with exploration of prev fusion and possible removal of old hardware  . BI-VENTRICULAR IMPLANTABLE CARDIOVERTER DEFIBRILLATOR N/A 05/16/2013   Procedure: BI-VENTRICULAR IMPLANTABLE CARDIOVERTER DEFIBRILLATOR  (CRT-D);  Surgeon: Marinus MawGregg W Taylor, MD;  [facility];  Service: Cardiovascular;  Laterality: N/A;  . BI-VENTRICULAR IMPLANTABLE CARDIOVERTER DEFIBRILLATOR  (CRT-D) Left 05/16/2013  STJ CRTD implanted by Dr Ladona Ridgel for primary prevention/CHF  . CARDIAC CATHETERIZATION  10/2012  . CARPAL TUNNEL RELEASE Right ~ 2013  . CATARACT EXTRACTION W/ INTRAOCULAR LENS IMPLANT Bilateral   . JOINT REPLACEMENT  2013  . LEFT AND RIGHT HEART CATHETERIZATION WITH CORONARY ANGIOGRAM N/A 11/06/2012   Procedure: LEFT AND RIGHT HEART CATHETERIZATION  WITH CORONARY ANGIOGRAM;  Surgeon: Laurey Morale, MD;  Location: [facility] C[facility];  Service: Cardiovascular;  Laterality: N/A;  . LUMBAR LAMINECTOMY/DECOMPRESSION MICRODISCECTOMY N/A 02/02/2015   Procedure: LUMBAR LAMINECTOMY/DECOMPRESSION MICRODISCECTOMY 2 LEVELS;  Surgeon: Tressie Stalker, MD;  Location: [facility];  Service: Neurosurgery;  Laterality: N/A;  L34 L45 laminectomy and foraminotomy  . LUMBAR WOUND DEBRIDEMENT N/A 03/04/2015   Procedure: Incision and Drainage of LUMBAR WOUND ;  Surgeon: Tressie Stalker, MD;  Location: [facility];  Service: Neurosurgery;  Laterality: N/A;  . 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 toe removal  Left    3rd and 4th toe removed  . TOTAL HIP ARTHROPLASTY Right ~ 2012     reports that he has never smoked. He has never used smokeless tobacco. He reports that he does not drink alcohol or use drugs.  Allergies  Allergen Reactions  . Acyclovir And Related Other (See Comments)    unknown  . Dristan Cold [Chlorphen-Pe-Acetaminophen] Anxiety    Makes pt feel "wired up"  . Prednisone Other (See Comments)    Messes with blood sugar levels     Family History  Problem Relation Age of Onset  . Diabetes Brother   . Cancer Father     Lung cancer  . Heart attack Mother     pt thinks she had MI  . Cancer Mother     Prior to Admission medications   Medication Sig Start Date End Date Taking? Authorizing Provider  acarbose (PRECOSE) 50 MG tablet Take 50 mg by mouth 3 (three) times daily with meals. Take 1 tablet 1-3 times a day   Yes Historical Provider, MD  aspirin EC 81 MG tablet Take 1 tablet (81 mg total) by mouth daily. 12/16/14  Yes Laurey Morale, MD  carvedilol (COREG) 12.5 MG tablet Take 1 tablet (12.5 mg total) by mouth 2 (two) times daily with a meal. 10/13/15  Yes Laurey Morale, MD  cyclobenzaprine (FLEXERIL) 5 MG tablet Take 1 tablet (5 mg total) by mouth 3 (three) times daily as needed for muscle spasms. 02/05/15  Yes  Tressie Stalker, MD  docusate sodium (COLACE) 100 MG capsule Take 300 mg by mouth 2 (two) times daily.    Yes Historical Provider, MD  fluticasone (FLONASE) 50 MCG/ACT nasal spray Place 2 sprays into the nose daily as needed for allergies.    Yes Historical Provider, MD  furosemide (LASIX) 40 MG tablet Take 40 mg by mouth every other day.   Yes Historical Provider, MD  gabapentin (NEURONTIN) 300 MG capsule Take 300 mg by mouth 3 (three) times daily. 11/09/12  Yes Christiane Ha, MD  glimepiride (AMARYL) 2 MG tablet Take 2 mg by mouth 2 (two) times daily.   Yes Historical Provider, MD  hydrALAZINE (APRESOLINE) 25 MG tablet TAKE ONE-HALF TABLET BY MOUTH THREE TIMES DAILY 10/30/14  Yes Dolores Patty, MD  HYDROcodone-acetaminophen (NORCO/VICODIN) 5-325 MG tablet Take 2 tablets by mouth every 8 (eight) hours as needed for moderate pain.   Yes Historical Provider, MD  isosorbide mononitrate (IMDUR) 30 MG 24 hr tablet Take 1 tablet (  30 mg total) by mouth daily. 09/15/15  Yes Laurey Morale, MD  ivabradine (CORLANOR) 5 MG TABS tablet Take 1 tablet (5 mg total) by mouth 2 (two) times daily with a meal. 10/13/15  Yes Laurey Morale, MD  magnesium hydroxide (MILK OF MAGNESIA) 400 MG/5ML suspension Take 30 mL by mouth daily as needed for constipation.   Yes Historical Provider, MD  metFORMIN (GLUCOPHAGE) 500 MG tablet Take 500 mg by mouth 2 (two) times daily with a meal.    Yes Historical Provider, MD  phenylephrine (NEO-SYNEPHRINE) 0.25 % nasal spray Place 1 spray into both nostrils every 6 (six) hours as needed for congestion. Reported on 10/13/2015   Yes Historical Provider, MD  pravastatin (PRAVACHOL) 40 MG tablet Take 1 tablet (40 mg total) by mouth at bedtime. 01/31/13  Yes Sharon Seller, NP  pseudoephedrine-guaifenesin (MUCINEX D) 60-600 MG 12 hr tablet Take 2 tablets by mouth daily as needed for congestion.   Yes Historical Provider, MD  saw palmetto 500 MG capsule Take 1,350 mg by mouth 3  (three) times daily.    Yes Historical Provider, MD    Physical Exam: Vitals:   11/25/15 0130 11/25/15 0155 11/25/15 0200 11/25/15 0215  BP:  155/77 141/71 134/66  Pulse:  89 70 83  Resp: 16 12 20 14   Temp:      TempSrc:      SpO2: 98% 100% 93% 99%      Constitutional: Not in distress. Vitals:   11/25/15 0130 11/25/15 0155 11/25/15 0200 11/25/15 0215  BP:  155/77 141/71 134/66  Pulse:  89 70 83  Resp: 16 12 20 14   Temp:      TempSrc:      SpO2: 98% 100% 93% 99%   Eyes: Anicteric no pallor. ENMT: No discharge from the ears eyes nose or mouth. Neck: No mass felt. No JVD appreciated. Respiratory: No rhonchi or crepitations. Cardiovascular: S1 and S2 heard. Abdomen: Soft nontender bowel sounds present. No guarding or rigidity. Musculoskeletal: No edema. Left foot second toe looks discolored with mild discharge. Skin: Left foot second toe looks discolored with mild discharge. Neurologic: Alert awake oriented to time place and person. Has difficulty bringing out words. No facial asymmetry. Tongue is midline. PERRLA positive. Psychiatric: Appears normal.   Labs on Admission: I have personally reviewed following labs and imaging studies  CBC:  Recent Labs Lab 11/24/15 1719  WBC 8.0  NEUTROABS 5.1  HGB 11.9*  HCT 37.5*  MCV 94.2  PLT 270   Basic Metabolic Panel:  Recent Labs Lab 11/24/15 1719  NA 137  K 4.3  CL 104  CO2 25  GLUCOSE 53*  BUN 25*  CREATININE 1.41*  CALCIUM 9.8   GFR: CrCl cannot be calculated (Unknown ideal weight.). Liver Function Tests:  Recent Labs Lab 11/24/15 1719  AST 19  ALT 8*  ALKPHOS 58  BILITOT 0.4  PROT 7.4  ALBUMIN 4.0   No results for input(s): LIPASE, AMYLASE in the last 168 hours. No results for input(s): AMMONIA in the last 168 hours. Coagulation Profile:  Recent Labs Lab 11/24/15 1719  INR 1.10   Cardiac Enzymes: No results for input(s): CKTOTAL, CKMB, CKMBINDEX, TROPONINI in the last 168 hours. BNP  (last 3 results) No results for input(s): PROBNP in the last 8760 hours. HbA1C: No results for input(s): HGBA1C in the last 72 hours. CBG:  Recent Labs Lab 11/24/15 2213 11/24/15 2341  GLUCAP 75 164*   Lipid Profile: No results for  input(s): CHOL, HDL, LDLCALC, TRIG, CHOLHDL, LDLDIRECT in the last 72 hours. Thyroid Function Tests: No results for input(s): TSH, T4TOTAL, FREET4, T3FREE, THYROIDAB in the last 72 hours. Anemia Panel: No results for input(s): VITAMINB12, FOLATE, FERRITIN, TIBC, IRON, RETICCTPCT in the last 72 hours. Urine analysis:    Component Value Date/Time   COLORURINE YELLOW 03/13/2015 0653   APPEARANCEUR CLEAR 03/13/2015 0653   LABSPEC 1.014 03/13/2015 0653   PHURINE 6.0 03/13/2015 0653   GLUCOSEU NEGATIVE 03/13/2015 0653   HGBUR NEGATIVE 03/13/2015 0653   BILIRUBINUR NEGATIVE 03/13/2015 0653   KETONESUR NEGATIVE 03/13/2015 0653   PROTEINUR NEGATIVE 03/13/2015 0653   UROBILINOGEN 0.2 10/29/2012 1316   NITRITE NEGATIVE 03/13/2015 0653   LEUKOCYTESUR NEGATIVE 03/13/2015 0653   Sepsis Labs: @LABRCNTIP (procalcitonin:4,lacticidven:4) )No results found for this or any previous visit (from the past 240 hour(s)).   Radiological Exams on Admission: Ct Head Wo Contrast  Result Date: 11/24/2015 CLINICAL DATA:  Confusion and aphasia for 2 days. EXAM: CT HEAD WITHOUT CONTRAST TECHNIQUE: Contiguous axial images were obtained from the base of the skull through the vertex without intravenous contrast. COMPARISON:  CT of the head 10/29/2012 FINDINGS: Brain: There is a large acute to subacute infarct in the left PCA territory involving left occipital and temporal lobes with small lacunar infarcts in the left thalamus, likely from occluded perforating branches. There is significant vasogenic edema compressing the posterior horn of the left lateral ventricle. No evidence of hemorrhage, hydrocephalus or mass lesion. Vascular: Calcific atherosclerotic disease at the skullbase.  Skull: No acute fractures. Sinuses/Orbits: No acute finding. Other: None. IMPRESSION: Large nonhemorrhagic acute to subacute left PCA territory infarct. Mass effect to the occipital horn of the left lateral ventricle without evidence of frank hydrocephalus. Critical Value/emergent results were called by telephone at the time of interpretation on 11/24/2015 at 6:30 pm to Dr. Derwood Kaplan , who verbally acknowledged these results. Electronically Signed   By: Ted Mcalpine M.D.   On: 11/24/2015 18:35    EKG: Independently reviewed. Patient rhythm.  Assessment/Plan Principal Problem:   Acute left PCA stroke (HCC) Active Problems:   Diabetes mellitus, type 2 (HCC)   HTN (hypertension)   Chronic systolic heart failure (HCC)   CAD (coronary artery disease)   ICD (implantable cardioverter-defibrillator), biventricular, in situ   Stroke (cerebrum) (HCC)    1. Acute left PCA stroke - appreciate neurology consult. Did discuss with Dr. Roseanne Reno on-call neurologist. Since patient cannot get MRI secondary to AICD neurology has recommended CT angiogram of the head and neck. Creatinine is around 1.4. Check 2-D echo patient is on aspirin. Check hemoglobin A1c and lipid panel. Patient's chart to mention that patient has history of atrial flutter. Closely observe in telemetry. 2. Left foot second toe infection - check sedimentation rate and x-ray to rule out any osteomyelitis. Patient is on vancomycin. Patient is following up with Dr. Lajoyce Corners. 3. Diabetes mellitus type 2 - hold metformin while inpatient. Patient is on Acarbose, Amaryl and sliding scale coverage. 4. CAD - denies any chest pain. On aspirin Imdur Coreg and statins. 5. Chronic systolic heart failure status post AICD placement - appears compensated. Since patient has stroke holding of Lasix for now. 6. Hypertension - allow for permissive hypertension due to stroke. Continue lisinopril and amlodipine and hydralazine. 7. Chronic kidney disease stage  III creatinine appears to be at baseline. Closely follow intake output and metabolic panel.   DVT prophylaxis: Lovenox.  Code Status: Full code.  Family Communication: Patient's wife.  Disposition  Plan: Home.  Consults called: Neurology.  Admission status: Observation.    Eduard Clos MD [facility] Pager [phone].  If 7PM-7AM, please contact night-coverage [email or web address] Password [email or web address]  11/25/2015, 2:50 AM

## 2015-11-26 ENCOUNTER — Inpatient Hospital Stay (HOSPITAL_COMMUNITY): Payer: Medicare Other

## 2015-11-26 DIAGNOSIS — L03032 Cellulitis of left toe: Secondary | ICD-10-CM

## 2015-11-26 LAB — ECHOCARDIOGRAM COMPLETE
HEIGHTINCHES: 70 in
WEIGHTICAEL: 2969.6 [oz_av]

## 2015-11-26 LAB — GLUCOSE, CAPILLARY
GLUCOSE-CAPILLARY: 176 mg/dL — AB (ref 65–99)
GLUCOSE-CAPILLARY: 198 mg/dL — AB (ref 65–99)
Glucose-Capillary: 118 mg/dL — ABNORMAL HIGH (ref 65–99)
Glucose-Capillary: 192 mg/dL — ABNORMAL HIGH (ref 65–99)

## 2015-11-26 LAB — HEMOGLOBIN A1C
HEMOGLOBIN A1C: 5.6 % (ref 4.8–5.6)
MEAN PLASMA GLUCOSE: 114 mg/dL

## 2015-11-26 MED ORDER — RIVAROXABAN 15 MG PO TABS
15.0000 mg | ORAL_TABLET | Freq: Every day | ORAL | Status: DC
  Administered 2015-11-26 – 2015-11-27 (×2): 15 mg via ORAL
  Filled 2015-11-26 (×3): qty 1

## 2015-11-26 NOTE — Progress Notes (Signed)
Pt removed telemetry box. When staff attempted to re-apply it he attempted to hit technician. CCMD called and placed on standby. [facility] on-call provider notified and was asked to document it and let MD know in am. Pt has AICD and has been V-Paced.

## 2015-11-26 NOTE — Progress Notes (Addendum)
PROGRESS NOTE  COMER [name]  VCB:[id number] DOB: Nov 11, 1940 DOA: 11/24/2015 PCP: Maggie Font, MD  Brief Narrative:   [name] is a 75 y.o. male with CAD, chronic systolic heart failure status post AICD placement, chronic kidney disease stage 3-4, diabetes mellitus type 2 was referred to the ER after patient was found to have difficulty bringing out words. Patient had experienced difficulty speaking words for 2 days prior to admission. Patient had followed up with this orthopedic surgeon Dr. Sharol Given for his left foot second toe discoloration and discharge.  Dr. Sharol Given noted patient's difficulty speaking and referred him to the ER. CT of the head showed stroke with possible mass effect. He denied difficulty swallowing blurred vision or moving upper or lower extremities. On-call neurologist Dr. Nicole Kindred recommended admission for stroke workup, but he was outside the timeframe for tPA. On exam patient also has left foot second toe discoloration.     Assessment & Plan:   Principal Problem:   Acute left PCA stroke (HCC) Active Problems:   Diabetes mellitus, type 2 (HCC)   HTN (hypertension)   Chronic systolic heart failure (HCC)   CAD (coronary artery disease)   ICD (implantable cardioverter-defibrillator), biventricular, in situ   Stroke (cerebrum) (HCC)   Stroke (HCC)  Acute left PCA stroke with dysarthria and expressive aphasia and confusion.  Unable to get MRI due to pacemaker.   -  CT angio head and neck demonstrated multifocal severe stenosis of the left posterior cerebral artery with occlusion of the mid P2 segment, right internal carotid artery origin stenosis of 55%. The again noted evidence of acute infarction throughout the distribution of the left PCA. -  Echocardiogram:  Pending -  Discontinued aspirin -  Started Xarelto -  LDL 82 > change to high dose rosuvastatin -  A1c 5.6 -  PT/OT recommended skilled nursing facility placement -  SLP recommended ongoing speech  therapy -  Tele:  Biventricularly paced.  Pacemaker interrogation demonstrated brief episodes of atrial flutter.    Left foot second toe infection -  ESR 33 -  Continue doxycycline at discharge  Diabetes mellitus type 2, A1c 5.6 -  Discontinued SU and metformin due to risk of hypoglycemia   Chronic systolic heart failure status post AICD placement -  Resume lasix once eating normally -  Continue carvedilol -  No ARB/ACEI due to CKD -  Continue ivabradine -  Continue imdur/hydralazine  CAD, stable, continue aspirin, BB, changed to high dose statin  Hypertension -  Continue carvedilol, hydralazine  CKD stage 3, creatinine at baseline of 1.4 -  Minimize nephrotoxins and renally dose medications   DVT prophylaxis:  Lovenox Code Status:  Full code Family Communication:  Wife at bedside Disposition Plan:  To SNF tomorrow.     Consultants:   Neurology  Procedures:  none  Antimicrobials:   None    Subjective: Denies focal numbness, weakness. Ongoing slurred speech. More disoriented today and does not know where he is or why he is here. Denies chest pains.  Objective: Vitals:   11/25/15 2053 11/26/15 0124 11/26/15 0520 11/26/15 1009  BP: (!) 144/69 138/66 135/64 (!) 97/51  Pulse: 77 72 75 (!) 59  Resp: _0 Temp: 98.3 F (36.8 C) 98.1 F (36.7 C) 98 F (36.7 C) 98.5 F (36.9 C)  TempSrc: Oral Oral Oral Oral  SpO2: 98% 98% 96% 100%  Weight:      Height:  Intake/Output Summary (Last 24 hours) at 11/26/15 1540 Last data filed at 11/26/15 0521  Gross per 24 hour  Intake                0 ml  Output             2500 ml  Net            -2500 ml   Filed Weights   11/25/15 0341  Weight: 84.2 kg (185 lb 9.6 oz)    Examination:  General exam:  Adult Male.  No acute distress.  HEENT:  NCAT, MMM Respiratory system: Clear to auscultation bilaterally Cardiovascular system: Regular rate and rhythm, normal S1/S2.  Slight heave. 3/6 systolic murmur  at the left sternal border.  Warm extremities Gastrointestinal system: Normal active bowel sounds, soft, nondistended, nontender. MSK:  Normal tone and bulk, no lower extremity edema, mild dark erythema of bilateral feet, second toe of the right foot and several toes on the left foot.  Abrasions on bilateral shins have decreasing surrounding erythema. Neuro:  Cranial nerves II through XII grossly intact. No obvious facial droop. Occasionally slow to answer questions, but no obvious word finding difficulties during my exam. Strength 5 out of 5 throughout, sensation intact to light touch on arms and legs Psych:  Confused, refusing telemetry.  Not oriented to place or time or reason for hospitalization    Data Reviewed: I have personally reviewed following labs and imaging studies  CBC:  Recent Labs Lab 11/24/15 1719 11/25/15 0327  WBC 8.0 6.5  NEUTROABS 5.1  --   HGB 11.9* 11.2*  HCT 37.5* 35.2*  MCV 94.2 93.6  PLT 270 161   Basic Metabolic Panel:  Recent Labs Lab 11/24/15 1719 11/25/15 0343  NA 137 138  K 4.3 4.0  CL 104 103  CO2 25 27  GLUCOSE 53* 121*  BUN 25* 27*  CREATININE 1.41* 1.41*  CALCIUM 9.8 9.7   GFR: Estimated Creatinine Clearance: 47.5 mL/min (by C-G formula based on SCr of 1.41 mg/dL). Liver Function Tests:  Recent Labs Lab 11/24/15 1719 11/25/15 0343  AST 19 17  ALT 8* 10*  ALKPHOS 58 56  BILITOT 0.4 0.7  PROT 7.4 7.1  ALBUMIN 4.0 3.8   No results for input(s): LIPASE, AMYLASE in the last 168 hours. No results for input(s): AMMONIA in the last 168 hours. Coagulation Profile:  Recent Labs Lab 11/24/15 1719  INR 1.10   Cardiac Enzymes: No results for input(s): CKTOTAL, CKMB, CKMBINDEX, TROPONINI in the last 168 hours. BNP (last 3 results) No results for input(s): PROBNP in the last 8760 hours. HbA1C:  Recent Labs  11/25/15 0328  HGBA1C 5.6   CBG:  Recent Labs Lab 11/25/15 1143 11/25/15 1716 11/25/15 2106 11/26/15 0620  11/26/15 1158  GLUCAP 130* 126* 111* 118* 176*   Lipid Profile:  Recent Labs  11/25/15 0328  CHOL 124  HDL 18*  LDLCALC 82  TRIG 118  CHOLHDL 6.9   Thyroid Function Tests: No results for input(s): TSH, T4TOTAL, FREET4, T3FREE, THYROIDAB in the last 72 hours. Anemia Panel: No results for input(s): VITAMINB12, FOLATE, FERRITIN, TIBC, IRON, RETICCTPCT in the last 72 hours. Urine analysis:    Component Value Date/Time   COLORURINE YELLOW 03/13/2015 [place] 03/13/2015 0653   LABSPEC 1.014 03/13/2015 0653   PHURINE 6.0 03/13/2015 [place] 03/13/2015 0653   HGBUR NEGATIVE 03/13/2015 0653   BILIRUBINUR NEGATIVE 03/13/2015 0653   KETONESUR NEGATIVE  03/13/2015 0653   PROTEINUR NEGATIVE 03/13/2015 0653   UROBILINOGEN 0.2 10/29/2012 1316   NITRITE NEGATIVE 03/13/2015 0653   LEUKOCYTESUR NEGATIVE 03/13/2015 0653   Sepsis Labs: _0 (procalcitonin:4,lacticidven:4)  )No results found for this or any previous visit (from the past 240 hour(s)).    Radiology Studies: Ct Angio Head W Or Wo Contrast  Result Date: 11/25/2015 CLINICAL DATA:  Left PCA infarct. EXAM: CT ANGIOGRAPHY HEAD AND NECK TECHNIQUE: Multidetector CT imaging of the head and neck was performed using the standard protocol during bolus administration of intravenous contrast. Multiplanar CT image reconstructions and MIPs were obtained to evaluate the vascular anatomy. Carotid stenosis measurements (when applicable) are obtained utilizing NASCET criteria, using the distal internal carotid diameter as the denominator. CONTRAST:  50 mL Isovue 370 COMPARISON:  Head CT 11/24/2015 FINDINGS: CTA NECK Aortic arch: 3 vessel aortic arch branching pattern. There is atherosclerotic calcification within the aortic arch and within the proximal arch vessels. Subclavian arteries are patent bilaterally. Right carotid system: There is multifocal atherosclerotic calcification within the right common carotid  artery without significant stenosis. There is atherosclerotic calcification at the carotid bifurcation with stenosis measuring 55%. The remainder of the is left internal carotid artery is normal. Left carotid system: There is multifocal atherosclerotic plaque and calcification within the left common carotid artery without hemodynamically significant stenosis. There is atherosclerotic calcification at the carotid bifurcation without hemodynamically significant stenosis. Vertebral arteries:There are several system is left dominant. The left vertebral artery origin is widely patent. There is narrowing of the right vertebral origin. The right vertebral artery is diminutive along its entire course. There is no hemodynamically significant stenosis of the left vertebral artery V1, V2 and V3 segments. Skeleton: There is cervical spine anterior fusion hardware extending from C3 the C6. No lytic or blastic osseous lesions. No advanced spinal canal stenosis. Other neck: The nasopharynx, oropharynx, hypopharynx and larynx are normal. Hypodense thyroid nodules measure up to 6 mm. No cervical lymphadenopathy. Normal salivary glands. Upper chest: There is an incidentally noted azygos fissure. Subpleural nodular opacity in the left upper lobe measures 6 mm (series 5, image 7). CTA HEAD Intracranial internal carotid arteries: There is moderate atherosclerotic calcification of the proximal intracranial internal carotid arteries, left-greater-than-right. Anterior cerebral arteries: Normal. Middle cerebral arteries: Normal. Posterior communicating arteries: Not seen on the right. Small PCOM on the left. Posterior cerebral arteries: There is multifocal severe narrowing of the left posterior cerebral artery with complete loss of opacification of multiple locations (image 21 series 12). The right PCA is normal. Basilar artery: Multifocal atherosclerotic calcification of the basilar artery. Vertebral arteries: The right vertebral artery is  diminutive and appears to terminate in PICA. Superior cerebellar arteries: Normal. Anterior inferior cerebellar arteries: Normal on the left. Not seen on the right. Posterior inferior cerebellar arteries: Normal. Venous sinuses: Hypoplastic left transverse sinus is likely a normal congenital variant. Otherwise normal. Anatomic variants: None Delayed phase: Re- demonstration of extensive hypoattenuation within the left PCA distribution compatible with acute infarct. IMPRESSION: 1. Multifocal severe stenosis of the left posterior cerebral artery with occlusion of the mid P2 segment. 2. Right internal carotid artery origin stenosis measuring 55%. 3. Redemonstration of hypoattenuation throughout the left PCA distribution compatible with acute infarction. No new mass effect or worsening midline shift. Electronically Signed   By: Ulyses Jarred M.D.   On: 11/25/2015 17:28   Ct Head Wo Contrast  Result Date: 11/24/2015 CLINICAL DATA:  Confusion and aphasia for 2 days. EXAM: CT HEAD WITHOUT CONTRAST TECHNIQUE:  Contiguous axial images were obtained from the base of the skull through the vertex without intravenous contrast. COMPARISON:  CT of the head 10/29/2012 FINDINGS: Brain: There is a large acute to subacute infarct in the left PCA territory involving left occipital and temporal lobes with small lacunar infarcts in the left thalamus, likely from occluded perforating branches. There is significant vasogenic edema compressing the posterior horn of the left lateral ventricle. No evidence of hemorrhage, hydrocephalus or mass lesion. Vascular: Calcific atherosclerotic disease at the skullbase. Skull: No acute fractures. Sinuses/Orbits: No acute finding. Other: None. IMPRESSION: Large nonhemorrhagic acute to subacute left PCA territory infarct. Mass effect to the occipital horn of the left lateral ventricle without evidence of frank hydrocephalus. Critical Value/emergent results were called by telephone at the time of  interpretation on 11/24/2015 at 6:30 pm to Dr. Varney Biles , who verbally acknowledged these results. Electronically Signed   By: Fidela Salisbury M.D.   On: 11/24/2015 18:35   Ct Angio Neck W Or Wo Contrast  Result Date: 11/25/2015 CLINICAL DATA:  Left PCA infarct. EXAM: CT ANGIOGRAPHY HEAD AND NECK TECHNIQUE: Multidetector CT imaging of the head and neck was performed using the standard protocol during bolus administration of intravenous contrast. Multiplanar CT image reconstructions and MIPs were obtained to evaluate the vascular anatomy. Carotid stenosis measurements (when applicable) are obtained utilizing NASCET criteria, using the distal internal carotid diameter as the denominator. CONTRAST:  50 mL Isovue 370 COMPARISON:  Head CT 11/24/2015 FINDINGS: CTA NECK Aortic arch: 3 vessel aortic arch branching pattern. There is atherosclerotic calcification within the aortic arch and within the proximal arch vessels. Subclavian arteries are patent bilaterally. Right carotid system: There is multifocal atherosclerotic calcification within the right common carotid artery without significant stenosis. There is atherosclerotic calcification at the carotid bifurcation with stenosis measuring 55%. The remainder of the is left internal carotid artery is normal. Left carotid system: There is multifocal atherosclerotic plaque and calcification within the left common carotid artery without hemodynamically significant stenosis. There is atherosclerotic calcification at the carotid bifurcation without hemodynamically significant stenosis. Vertebral arteries:There are several system is left dominant. The left vertebral artery origin is widely patent. There is narrowing of the right vertebral origin. The right vertebral artery is diminutive along its entire course. There is no hemodynamically significant stenosis of the left vertebral artery V1, V2 and V3 segments. Skeleton: There is cervical spine anterior fusion  hardware extending from C3 the C6. No lytic or blastic osseous lesions. No advanced spinal canal stenosis. Other neck: The nasopharynx, oropharynx, hypopharynx and larynx are normal. Hypodense thyroid nodules measure up to 6 mm. No cervical lymphadenopathy. Normal salivary glands. Upper chest: There is an incidentally noted azygos fissure. Subpleural nodular opacity in the left upper lobe measures 6 mm (series 5, image 7). CTA HEAD Intracranial internal carotid arteries: There is moderate atherosclerotic calcification of the proximal intracranial internal carotid arteries, left-greater-than-right. Anterior cerebral arteries: Normal. Middle cerebral arteries: Normal. Posterior communicating arteries: Not seen on the right. Small PCOM on the left. Posterior cerebral arteries: There is multifocal severe narrowing of the left posterior cerebral artery with complete loss of opacification of multiple locations (image 21 series 12). The right PCA is normal. Basilar artery: Multifocal atherosclerotic calcification of the basilar artery. Vertebral arteries: The right vertebral artery is diminutive and appears to terminate in PICA. Superior cerebellar arteries: Normal. Anterior inferior cerebellar arteries: Normal on the left. Not seen on the right. Posterior inferior cerebellar arteries: Normal. Venous sinuses: Hypoplastic left transverse  sinus is likely a normal congenital variant. Otherwise normal. Anatomic variants: None Delayed phase: Re- demonstration of extensive hypoattenuation within the left PCA distribution compatible with acute infarct. IMPRESSION: 1. Multifocal severe stenosis of the left posterior cerebral artery with occlusion of the mid P2 segment. 2. Right internal carotid artery origin stenosis measuring 55%. 3. Redemonstration of hypoattenuation throughout the left PCA distribution compatible with acute infarction. No new mass effect or worsening midline shift. Electronically Signed   By: Ulyses Jarred M.D.    On: 11/25/2015 17:28   Dg Foot 2 Views Left  Result Date: 11/25/2015 CLINICAL DATA:  Cellulitis of left second toe for weeks. EXAM: LEFT FOOT - 2 VIEW COMPARISON:  None. FINDINGS: No erosion or joint destruction to suggest acute osteomyelitis. Third and fourth digital amputations. Hammertoe deformities of the second and fifth digits. Degenerative spurring about the first MTP joint with possible gouty erosion medially in the metatarsal. Osteopenia. Arterial calcification. Dorsal midfoot and calcaneal spurring. IMPRESSION: 1. No suspected osteomyelitis. 2. Possible gouty erosion in the first metatarsal head. Electronically Signed   By: Monte Fantasia M.D.   On: 11/25/2015 03:18     Scheduled Meds: . acarbose  50 mg Oral TID WC  . carvedilol  12.5 mg Oral BID WC  . docusate sodium  300 mg Oral BID  . doxycycline  100 mg Oral Q12H  . gabapentin  300 mg Oral TID  . hydrALAZINE  37.5 mg Oral Q8H  . insulin aspart  0-9 Units Subcutaneous TID WC  . isosorbide mononitrate  30 mg Oral Daily  . ivabradine  5 mg Oral BID WC  . rivaroxaban  15 mg Oral Q supper  . rosuvastatin  10 mg Oral q1800   Continuous Infusions:    LOS: 1 day    Time spent: 30 min    Janece Canterbury, MD [facility] Pager [phone]  If 7PM-7AM, please contact night-coverage [email or web address] Password [facility] 11/26/2015, 3:40 PM
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
## 2015-11-26 NOTE — Progress Notes (Signed)
Occupational Therapy Treatment Patient Details Name: [name] MRN: [id number] DOB: 1941-01-06 Today's Date: 11/26/2015    History of present illness 75 y.o. male with CAD, chronic systolic heart failure status post AICD placement, chronic kidney disease stage 3-4, diabetes mellitus type 2 was referred to the ER after patient was found to have difficulty bringing out words. Patient has been experiencing difficulty speaking words for last 2 days. CT on 8/22 showed a large acute to subacute infarct in the left PCA    OT comments  Pt currently mod assist for functional mobility requiring max multimodal cues throughout for safety, initiation, and sequencing. Pt fatigues quickly with functional activities but able to perform UB/LB dressing with min assist and oral care standing at the sink with mod assist for balance. Updated d/c recommendations to SNF for follow up to maximize independence and safety with ADL and functional mobility prior to return home. Will continue to follow acutely.   Follow Up Recommendations  SNF;Supervision/Assistance - 24 hour    Equipment Recommendations  Other (comment) (TBD at next venue)    Recommendations for Other Services      Precautions / Restrictions Precautions Precautions: Fall Restrictions Weight Bearing Restrictions: No       Mobility Bed Mobility Overal bed mobility: Needs Assistance Bed Mobility: Supine to Sit;Sit to Supine     Supine to sit: Min guard Sit to supine: Min guard   General bed mobility comments: Cueing for sequencing and intiation. Increased time required.  Transfers Overall transfer level: Needs assistance Equipment used: Rolling walker (2 wheeled) Transfers: Sit to/from Stand Sit to Stand: Min guard         General transfer comment: Min guard for sit to stand from EOB. Min-mod assist for balance in standing. VCs for hand placement with RW.    Balance Overall balance assessment: Needs  assistance Sitting-balance support: Feet supported;No upper extremity supported Sitting balance-Leahy Scale: Good     Standing balance support: No upper extremity supported;During functional activity Standing balance-Leahy Scale: Poor Standing balance comment: Mod assist for balance in static standing.                   ADL Overall ADL's : Needs assistance/impaired     Grooming: Moderate assistance;Standing;Oral care;Cueing for safety;Cueing for sequencing Grooming Details (indicate cue type and reason): Mod assist for standing balance throughout grooming task. Max verbal cues for sequencing and iniation of task.          Upper Body Dressing : Minimal assistance;Sitting Upper Body Dressing Details (indicate cue type and reason): for doffing/donning gown Lower Body Dressing: Minimal assistance Lower Body Dressing Details (indicate cue type and reason): Pt able to doff underwear in standing with min assist for balance.             Functional mobility during ADLs: Moderate assistance;Rolling walker General ADL Comments: Pt fatigues quickly with functional activities. Pt easily distracted with wife talking in background during functional activity; max cueing for sequencing, iniation, and attention to task.      Vision                     Perception     Praxis      Cognition   Behavior During Therapy: [facility] for tasks assessed/performed Overall Cognitive Status: Impaired/Different from baseline Area of Impairment: Attention;Memory;Following commands;Safety/judgement;Awareness;Problem solving   Current Attention Level: Sustained Memory: Decreased short-term memory  Following Commands: Follows one step commands consistently Safety/Judgement: Decreased awareness of deficits;Decreased  awareness of safety Awareness: Emergent Problem Solving: Difficulty sequencing;Requires verbal cues;Requires tactile cues;Decreased initiation General Comments: Per wife, pt very  sharp at baseline. Confusion has been present over the past few days.    Extremity/Trunk Assessment               Exercises     Shoulder Instructions       General Comments      Pertinent Vitals/ Pain       Pain Assessment: No/denies pain  Home Living                                          Prior Functioning/Environment              Frequency Min 2X/week     Progress Toward Goals  OT Goals(current goals can now be found in the care plan section)  Progress towards OT goals: Progressing toward goals  Acute Rehab OT Goals Patient Stated Goal: none stated OT Goal Formulation: With patient  Plan Discharge plan needs to be updated    Co-evaluation                 End of Session Equipment Utilized During Treatment: Gait belt;Rolling walker   Activity Tolerance Patient tolerated treatment well   Patient Left in bed;with call bell/phone within reach;with bed alarm set;with family/visitor present   Nurse Communication Mobility status        Time: 1433-1500 OT Time Calculation (min): 27 min  Charges: OT General Charges $OT Visit: 1 Procedure OT Treatments $Self Care/Home Management : 23-37 mins  [name] M.S., OTR/L Pager: [phone]  11/26/2015, 3:14 PM

## 2015-11-26 NOTE — Progress Notes (Signed)
Echocardiogram 2D Echocardiogram has been performed.  Dorothey Baseman 11/26/2015, 11:24 AM

## 2015-11-26 NOTE — Progress Notes (Addendum)
STROKE TEAM PROGRESS NOTE   SUBJECTIVE (INTERVAL HISTORY) No family at bedside. DR. Pearlean Brownie to speak with Dr. Malachi Bonds related to anticoagulation.   OBJECTIVE Temp:  [98 F (36.7 C)-98.5 F (36.9 C)] 98.5 F (36.9 C) (08/24 1009) Pulse Rate:  [59-79] 59 (08/24 1009) Cardiac Rhythm: Ventricular paced (08/23 1900) Resp:  [16-20] 16 (08/24 1009) BP: (97-144)/(48-69) 97/51 (08/24 1009) SpO2:  [96 %-100 %] 100 % (08/24 1009)  CBC:   Recent Labs Lab 11/24/15 1719 11/25/15 0327  WBC 8.0 6.5  NEUTROABS 5.1  --   HGB 11.9* 11.2*  HCT 37.5* 35.2*  MCV 94.2 93.6  PLT 270 223    Basic Metabolic Panel:   Recent Labs Lab 11/24/15 1719 11/25/15 0343  NA 137 138  K 4.3 4.0  CL 104 103  CO2 25 27  GLUCOSE 53* 121*  BUN 25* 27*  CREATININE 1.41* 1.41*  CALCIUM 9.8 9.7    Lipid Panel:     Component Value Date/Time   CHOL 124 11/25/2015 0328   TRIG 118 11/25/2015 0328   HDL 18 (L) 11/25/2015 0328   CHOLHDL 6.9 11/25/2015 0328   VLDL 24 11/25/2015 0328   LDLCALC 82 11/25/2015 0328   HgbA1c:  Lab Results  Component Value Date   HGBA1C 5.6 11/25/2015    IMAGING  Ct Angio Head W Or Wo Contrast  Result Date: 11/25/2015 CLINICAL DATA:  Left PCA infarct. EXAM: CT ANGIOGRAPHY HEAD AND NECK TECHNIQUE: Multidetector CT imaging of the head and neck was performed using the standard protocol during bolus administration of intravenous contrast. Multiplanar CT image reconstructions and MIPs were obtained to evaluate the vascular anatomy. Carotid stenosis measurements (when applicable) are obtained utilizing NASCET criteria, using the distal internal carotid diameter as the denominator. CONTRAST:  50 mL Isovue 370 COMPARISON:  Head CT 11/24/2015 FINDINGS: CTA NECK Aortic arch: 3 vessel aortic arch branching pattern. There is atherosclerotic calcification within the aortic arch and within the proximal arch vessels. Subclavian arteries are patent bilaterally. Right carotid system: There is  multifocal atherosclerotic calcification within the right common carotid artery without significant stenosis. There is atherosclerotic calcification at the carotid bifurcation with stenosis measuring 55%. The remainder of the is left internal carotid artery is normal. Left carotid system: There is multifocal atherosclerotic plaque and calcification within the left common carotid artery without hemodynamically significant stenosis. There is atherosclerotic calcification at the carotid bifurcation without hemodynamically significant stenosis. Vertebral arteries:There are several system is left dominant. The left vertebral artery origin is widely patent. There is narrowing of the right vertebral origin. The right vertebral artery is diminutive along its entire course. There is no hemodynamically significant stenosis of the left vertebral artery V1, V2 and V3 segments. Skeleton: There is cervical spine anterior fusion hardware extending from C3 the C6. No lytic or blastic osseous lesions. No advanced spinal canal stenosis. Other neck: The nasopharynx, oropharynx, hypopharynx and larynx are normal. Hypodense thyroid nodules measure up to 6 mm. No cervical lymphadenopathy. Normal salivary glands. Upper chest: There is an incidentally noted azygos fissure. Subpleural nodular opacity in the left upper lobe measures 6 mm (series 5, image 7). CTA HEAD Intracranial internal carotid arteries: There is moderate atherosclerotic calcification of the proximal intracranial internal carotid arteries, left-greater-than-right. Anterior cerebral arteries: Normal. Middle cerebral arteries: Normal. Posterior communicating arteries: Not seen on the right. Small PCOM on the left. Posterior cerebral arteries: There is multifocal severe narrowing of the left posterior cerebral artery with complete loss of opacification of multiple  locations (image 21 series 12). The right PCA is normal. Basilar artery: Multifocal atherosclerotic calcification  of the basilar artery. Vertebral arteries: The right vertebral artery is diminutive and appears to terminate in PICA. Superior cerebellar arteries: Normal. Anterior inferior cerebellar arteries: Normal on the left. Not seen on the right. Posterior inferior cerebellar arteries: Normal. Venous sinuses: Hypoplastic left transverse sinus is likely a normal congenital variant. Otherwise normal. Anatomic variants: None Delayed phase: Re- demonstration of extensive hypoattenuation within the left PCA distribution compatible with acute infarct. IMPRESSION: 1. Multifocal severe stenosis of the left posterior cerebral artery with occlusion of the mid P2 segment. 2. Right internal carotid artery origin stenosis measuring 55%. 3. Redemonstration of hypoattenuation throughout the left PCA distribution compatible with acute infarction. No new mass effect or worsening midline shift. Electronically Signed   By: Deatra RobinsonKevin  Herman M.D.   On: 11/25/2015 17:28   Ct Head Wo Contrast  Result Date: 11/24/2015 CLINICAL DATA:  Confusion and aphasia for 2 days. EXAM: CT HEAD WITHOUT CONTRAST TECHNIQUE: Contiguous axial images were obtained from the base of the skull through the vertex without intravenous contrast. COMPARISON:  CT of the head 10/29/2012 FINDINGS: Brain: There is a large acute to subacute infarct in the left PCA territory involving left occipital and temporal lobes with small lacunar infarcts in the left thalamus, likely from occluded perforating branches. There is significant vasogenic edema compressing the posterior horn of the left lateral ventricle. No evidence of hemorrhage, hydrocephalus or mass lesion. Vascular: Calcific atherosclerotic disease at the skullbase. Skull: No acute fractures. Sinuses/Orbits: No acute finding. Other: None. IMPRESSION: Large nonhemorrhagic acute to subacute left PCA territory infarct. Mass effect to the occipital horn of the left lateral ventricle without evidence of frank hydrocephalus.  Critical Value/emergent results were called by telephone at the time of interpretation on 11/24/2015 at 6:30 pm to Dr. Derwood KaplanANKIT NANAVATI , who verbally acknowledged these results. Electronically Signed   By: Ted Mcalpineobrinka  Dimitrova M.D.   On: 11/24/2015 18:35   Ct Angio Neck W Or Wo Contrast  Result Date: 11/25/2015 CLINICAL DATA:  Left PCA infarct. EXAM: CT ANGIOGRAPHY HEAD AND NECK TECHNIQUE: Multidetector CT imaging of the head and neck was performed using the standard protocol during bolus administration of intravenous contrast. Multiplanar CT image reconstructions and MIPs were obtained to evaluate the vascular anatomy. Carotid stenosis measurements (when applicable) are obtained utilizing NASCET criteria, using the distal internal carotid diameter as the denominator. CONTRAST:  50 mL Isovue 370 COMPARISON:  Head CT 11/24/2015 FINDINGS: CTA NECK Aortic arch: 3 vessel aortic arch branching pattern. There is atherosclerotic calcification within the aortic arch and within the proximal arch vessels. Subclavian arteries are patent bilaterally. Right carotid system: There is multifocal atherosclerotic calcification within the right common carotid artery without significant stenosis. There is atherosclerotic calcification at the carotid bifurcation with stenosis measuring 55%. The remainder of the is left internal carotid artery is normal. Left carotid system: There is multifocal atherosclerotic plaque and calcification within the left common carotid artery without hemodynamically significant stenosis. There is atherosclerotic calcification at the carotid bifurcation without hemodynamically significant stenosis. Vertebral arteries:There are several system is left dominant. The left vertebral artery origin is widely patent. There is narrowing of the right vertebral origin. The right vertebral artery is diminutive along its entire course. There is no hemodynamically significant stenosis of the left vertebral artery V1, V2  and V3 segments. Skeleton: There is cervical spine anterior fusion hardware extending from C3 the C6. No lytic or blastic  osseous lesions. No advanced spinal canal stenosis. Other neck: The nasopharynx, oropharynx, hypopharynx and larynx are normal. Hypodense thyroid nodules measure up to 6 mm. No cervical lymphadenopathy. Normal salivary glands. Upper chest: There is an incidentally noted azygos fissure. Subpleural nodular opacity in the left upper lobe measures 6 mm (series 5, image 7). CTA HEAD Intracranial internal carotid arteries: There is moderate atherosclerotic calcification of the proximal intracranial internal carotid arteries, left-greater-than-right. Anterior cerebral arteries: Normal. Middle cerebral arteries: Normal. Posterior communicating arteries: Not seen on the right. Small PCOM on the left. Posterior cerebral arteries: There is multifocal severe narrowing of the left posterior cerebral artery with complete loss of opacification of multiple locations (image 21 series 12). The right PCA is normal. Basilar artery: Multifocal atherosclerotic calcification of the basilar artery. Vertebral arteries: The right vertebral artery is diminutive and appears to terminate in PICA. Superior cerebellar arteries: Normal. Anterior inferior cerebellar arteries: Normal on the left. Not seen on the right. Posterior inferior cerebellar arteries: Normal. Venous sinuses: Hypoplastic left transverse sinus is likely a normal congenital variant. Otherwise normal. Anatomic variants: None Delayed phase: Re- demonstration of extensive hypoattenuation within the left PCA distribution compatible with acute infarct. IMPRESSION: 1. Multifocal severe stenosis of the left posterior cerebral artery with occlusion of the mid P2 segment. 2. Right internal carotid artery origin stenosis measuring 55%. 3. Redemonstration of hypoattenuation throughout the left PCA distribution compatible with acute infarction. No new mass effect or  worsening midline shift. Electronically Signed   By: Deatra Robinson M.D.   On: 11/25/2015 17:28   Dg Foot 2 Views Left  Result Date: 11/25/2015 CLINICAL DATA:  Cellulitis of left second toe for weeks. EXAM: LEFT FOOT - 2 VIEW COMPARISON:  None. FINDINGS: No erosion or joint destruction to suggest acute osteomyelitis. Third and fourth digital amputations. Hammertoe deformities of the second and fifth digits. Degenerative spurring about the first MTP joint with possible gouty erosion medially in the metatarsal. Osteopenia. Arterial calcification. Dorsal midfoot and calcaneal spurring. IMPRESSION: 1. No suspected osteomyelitis. 2. Possible gouty erosion in the first metatarsal head. Electronically Signed   By: Marnee Spring M.D.   On: 11/25/2015 03:18    PHYSICAL EXAM Pleasant elderly caucasian male not in distress. . Afebrile. Head is nontraumatic. Neck is supple without bruit.    Cardiac exam no murmur or gallop. Lungs are clear to auscultation. Distal pulses are not well felt due to wearing pull-ups in both legs for infected toe. Neurological Exam :  Awake alert oriented 2. Diminished  attension, registration and recall. Poor insight into his illness. Left gaze preference. Can look to the right past midline but not all the way. Dense right homonymous hemianopsia. Pupils irregular equal reactive. Fundi were not visualized. Vision acuity seems adequate. Tongue midline. Motor system exam reveals no upper or lower extremity drift but diminished fine finger movements on the right. Mild weakness of right grip and orbits left-to-right approximately. Mild paraparesis with weakness proximally and distally of both lower extremities 3/5.   ASSESSMENT/PLAN [name] is a 75 y.o. male with history of diabetes mellitus, hypertension, hyperlipidemia, coronary artery disease, CHF and AICD placement presenting with abnormal speech output x 2 days. He did not receive IV t-PA due to beyond time window.    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 R ICA 55%. L PCA infarct.   2D Echo  pending   LDL 82  HgbA1c 5.6  Lovenox 40 mg sq daily for VTE prophylaxis Diet heart healthy/carb modified Room service appropriate? Yes; Fluid consistency: Thin  aspirin 81 mg daily prior to admission, now on aspirin 325 mg daily. Consider change to NOAC. See below  Patient counseled to be compliant with his antithrombotic medications  Ongoing aggressive stroke risk factor management  Therapy recommendations:  HH OT, HH PT, HH SLP language  Disposition:  Return home with wife (from home)  Atrial Flutter  Listed on problem list - per note from Dr. Shirlee Latch 12/16/2014, pt had intermittent atrial flutter during hospitalization felt to be from Milrinone.  Home anticoagulation:  none   Consider change to NOAC. Dr. Pearlean Brownie will speak with Dr. Malachi Bonds to decide.  cardiology interrogated device - there was 8 secondary of Atrial Flutter on 8/18. Dr. Shirlee Latch recommends Xarelto of eliquis. Will start on xarelto  Hypertension  Stable  Permissive hypertension (OK if < 220/120) but gradually normalize in 5-7 days  Long-term BP goal normotensive  Hyperlipidemia  Home meds:  pravachol 40 mg daily, resumed in hospital  LDL 82, goal < 70  Continue statin at discharge  Diabetes type II  HgbA1c 5.6, goal < 7.0  Other Stroke Risk Factors  Advanced age  Coronary artery disease  Chronic systolic heart failure (hx EF 16%->10-96% in July)  Other Active Problems  Infected toe (L 2nd toe), awaiting amputation, on Vancomycin  Chronic kidney disease stage III  NOTHING FURTHER TO ADD FROM THE STROKE STANDPOINT  Patient has a 10-15% risk of having another stroke over the next year, the highest risk is within 2 weeks of the  most recent stroke/TIA (risk of having a stroke following a stroke or TIA is the same).  Ongoing risk factor control by Primary Care Physician  Stroke Service will sign off. Please call should any needs arise.  Follow-up Stroke Clinic at [facility] with Dr. Delia Heady in 2 months, order placed.  Hospital day # 1  Rhoderick Moody [facility] See Amion for Pager information 11/26/2015 10:36 AM  I have personally examined this patient, reviewed notes, independently viewed imaging studies, participated in medical decision making and plan of care. I have made any additions or clarifications directly to the above note. Agree with note above.    Delia Heady, MD Medical Director [facility] Pager: [phone] 11/26/2015 3:31 PM   To contact Stroke Continuity provider, please refer to [email or web address]. After hours, contact General Neurology

## 2015-11-26 NOTE — Progress Notes (Signed)
ANTICOAGULATION CONSULT NOTE - Initial Consult  Pharmacy Consult for Xarelto Indication: atrial fibrillation  Allergies  Allergen Reactions  . Acyclovir And Related Other (See Comments)    unknown  . Dristan Cold [Chlorphen-Pe-Acetaminophen] Anxiety    Makes pt feel "wired up"  . Prednisone Other (See Comments)    Messes with blood sugar levels     Patient Measurements: Height: 5\' 10"  (177.8 cm) Weight: 185 lb 9.6 oz (84.2 kg) IBW/kg (Calculated) : 73  Vital Signs: Temp: 98.5 F (36.9 C) (08/24 1009) Temp Source: Oral (08/24 1009) BP: 97/51 (08/24 1009) Pulse Rate: 59 (08/24 1009)  Labs:  Recent Labs  11/24/15 1719 11/25/15 0327 11/25/15 0343  HGB 11.9* 11.2*  --   HCT 37.5* 35.2*  --   PLT 270 223  --   APTT 32  --   --   LABPROT 14.2  --   --   INR 1.10  --   --   CREATININE 1.41*  --  1.41*    Estimated Creatinine Clearance: 47.5 mL/min (by C-G formula based on SCr of 1.41 mg/dL).   Medical History: Past Medical History:  Diagnosis Date  . Acute encephalopathy   . AICD (automatic cardioverter/defibrillator) present   . Anemia   . Arthritis    "all over" (05/16/2013)  . Atrial flutter (HCC)   . CAD (coronary artery disease)   . Cardiomyopathy (HCC)    a. 10/2012 Echo: EF 20-25%, diff HK, mod dil LA/RV/RA.  Marland Kitchen Cervical myelopathy (HCC)   . Cervical spinal stenosis   . Chronic systolic congestive heart failure (HCC)   . CKD (chronic kidney disease), stage III   . 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 Depression    "son died in service in 08-01-90; still bothers me" (05/16/2013)  . DVT (deep venous thrombosis) (HCC) 10/2011   RUE; pt denies this hx on 05/16/2013  . Dysrhythmia   . Frequent falls   . GERD (gastroesophageal reflux disease)   . H/O hiatal hernia   . Hyperlipidemia   . Hypertension   . Hypoglycemia   . Lumbar spinal stenosis   . Mononeuritis of unspecified  site   . Myocardial infarction (HCC)   . Pneumonia 1960   , also 2014  . Quadriparesis (HCC) 08/01/2011   pre op  . Shortness of breath dyspnea    with exertion  . Type II diabetes mellitus (HCC)    type 2  . Venous insufficiency     Assessment: 75 yo M admitted with a CVA. Found to have Afib and pharmacy consulted to start Xarelto. Last dose of Lovenox for VTE px was this am. Hgb 11.2, plts wnl. CKD 3-4. SCr 1.41, CrCl ~75ml/min.  Goal of Therapy:  Monitor platelets by anticoagulation protocol: Yes   Plan:  Start Xarelto 15mg  PO daily Monitor CBC, s/s of bleed  Enzo Bi, PharmD, [facility] Clinical Pharmacist Pager [phone] 11/26/2015 12:58 PM

## 2015-11-27 DIAGNOSIS — I63539 Cerebral infarction due to unspecified occlusion or stenosis of unspecified posterior cerebral artery: Secondary | ICD-10-CM

## 2015-11-27 DIAGNOSIS — I5022 Chronic systolic (congestive) heart failure: Secondary | ICD-10-CM

## 2015-11-27 LAB — GLUCOSE, CAPILLARY
GLUCOSE-CAPILLARY: 145 mg/dL — AB (ref 65–99)
Glucose-Capillary: 209 mg/dL — ABNORMAL HIGH (ref 65–99)
Glucose-Capillary: 136 mg/dL — ABNORMAL HIGH (ref 65–99)
Glucose-Capillary: 106 mg/dL — ABNORMAL HIGH (ref 65–99)

## 2015-11-27 LAB — BASIC METABOLIC PANEL
ANION GAP: 8 (ref 5–15)
BUN: 29 mg/dL — ABNORMAL HIGH (ref 6–20)
CHLORIDE: 106 mmol/L (ref 101–111)
CO2: 24 mmol/L (ref 22–32)
CREATININE: 1.44 mg/dL — AB (ref 0.61–1.24)
Calcium: 9.2 mg/dL (ref 8.9–10.3)
GFR calc non Af Amer: 46 mL/min — ABNORMAL LOW (ref >60)
GFR, EST AFRICAN AMERICAN: 54 mL/min — AB (ref >60)
Glucose, Bld: 136 mg/dL — ABNORMAL HIGH (ref 65–99)
POTASSIUM: 4.2 mmol/L (ref 3.5–5.1)
SODIUM: 138 mmol/L (ref 135–145)

## 2015-11-27 MED ORDER — HYDROCODONE-ACETAMINOPHEN 5-325 MG PO TABS: 2.0000 | ORAL_TABLET | Freq: Three times a day (TID) | ORAL | 0 refills | Status: DC | PRN

## 2015-11-27 MED ORDER — FUROSEMIDE 40 MG PO TABS: 40.0000 mg | ORAL_TABLET | Freq: Every day | ORAL | 0 refills | Status: DC

## 2015-11-27 MED ORDER — FUROSEMIDE 40 MG PO TABS
40.0000 mg | ORAL_TABLET | Freq: Every day | ORAL | Status: DC
  Administered 2015-11-27: 40 mg via ORAL
  Filled 2015-11-27: qty 1

## 2015-11-27 MED ORDER — ROSUVASTATIN CALCIUM 10 MG PO TABS: 10.0000 mg | ORAL_TABLET | Freq: Every day | ORAL | 0 refills | Status: DC

## 2015-11-27 MED ORDER — DOXYCYCLINE HYCLATE 100 MG PO TABS: 100.0000 mg | ORAL_TABLET | Freq: Two times a day (BID) | ORAL | 0 refills | Status: DC

## 2015-11-27 MED ORDER — RIVAROXABAN 15 MG PO TABS: 15.0000 mg | ORAL_TABLET | Freq: Every day | ORAL | 0 refills | Status: DC

## 2015-11-27 NOTE — Care Management Important Message (Signed)
Important Message  Patient Details  Name: [name] MRN: [id number] Date of Birth: June 19, 1940   Medicare Important Message Given:  Yes    Dorena Bodo 11/27/2015, 11:16 AM

## 2015-11-27 NOTE — Progress Notes (Signed)
Physical Therapy Treatment Patient Details Name: [name] MRN: [id number] DOB: 13-May-1940 Today's Date: 11/27/2015    History of Present Illness 75 y.o. male with CAD, chronic systolic heart failure status post AICD placement, chronic kidney disease stage 3-4, diabetes mellitus type 2 was referred to the ER after patient was found to have difficulty bringing out words. Patient has been experiencing difficulty speaking words for last 2 days. CT on 8/22 showed a large acute to subacute infarct in the left PCA     PT Comments    Progressing slowly, Emphasis on there ex, transitions and gait stability.   Follow Up Recommendations  SNF     Equipment Recommendations  Other (comment) (TBA)    Recommendations for Other Services       Precautions / Restrictions Precautions Precautions: Fall    Mobility  Bed Mobility Overal bed mobility: Needs Assistance Bed Mobility: Supine to Sit;Sit to Supine     Supine to sit: Min guard Sit to supine: Min guard   General bed mobility comments: duing for sequencing  Transfers Overall transfer level: Needs assistance Equipment used: Rolling walker (2 wheeled) Transfers: Sit to/from Stand Sit to Stand: Min guard            Ambulation/Gait Ambulation/Gait assistance: ArchitectMin assist Ambulation Distance (Feet): 120 Feet Assistive device: Rolling walker (2 wheeled) Gait Pattern/deviations: Step-through pattern Gait velocity: slower Gait velocity interpretation: Below normal speed for age/gender General Gait Details: mildly unsteady with varying distance from RW.  cues for posture and assist to steady and assist upright posture.   Stairs            Wheelchair Mobility    Modified Rankin (Stroke Patients Only) Modified Rankin (Stroke Patients Only) Pre-Morbid Rankin Score: No significant disability Modified Rankin: Moderately severe disability     Balance Overall balance assessment: Needs assistance   Sitting  balance-Leahy Scale: Good Sitting balance - Comments: exercises at EOB, initially pt could maintain upright posture.  As exercise progressed pt started listing backward.     Standing balance-Leahy Scale: Poor                      Cognition Arousal/Alertness: Awake/alert Behavior During Therapy: WFL for tasks assessed/performed Overall Cognitive Status: Impaired/Different from baseline Area of Impairment: Attention;Memory;Following commands;Safety/judgement;Awareness;Problem solving Orientation Level: Disoriented to;Place;Time;Situation Current Attention Level: Sustained Memory: Decreased short-term memory Following Commands: Follows one step commands consistently Safety/Judgement: Decreased awareness of deficits;Decreased awareness of safety Awareness: Emergent Problem Solving: Difficulty sequencing;Requires verbal cues;Requires tactile cues;Decreased initiation      Exercises General Exercises - Lower Extremity Long Arc Quad: AROM;Strengthening;10 reps;Both;Seated Hip Flexion/Marching: AROM;Strengthening;Both;10 reps;Seated Heel Raises: AROM;Strengthening;Both;10 reps;Seated    General Comments        Pertinent Vitals/Pain Pain Assessment: No/denies pain    Home Living                      Prior Function            PT Goals (current goals can now be found in the care plan section) Acute Rehab PT Goals Patient Stated Goal: none stated Time For Goal Achievement: 12/09/15 Potential to Achieve Goals: Good Progress towards PT goals: Progressing toward goals    Frequency  Min 3X/week    PT Plan Discharge plan needs to be updated    Co-evaluation             End of Session   Activity Tolerance: Patient tolerated treatment well  Patient left: in bed;with call bell/phone within reach     Time: [phone] PT Time Calculation (min) (ACUTE ONLY): 25 min  Charges:  $Gait Training: 8-22 mins $Therapeutic Exercise: 8-22 mins                     G Codes:      Trajon Rosete, Eliseo Gum 11/27/2015, 4:40 PM 11/27/2015  [place] Bing, PT [phone]  (pager)

## 2015-11-27 NOTE — [place] FL2 (Signed)
[place] MEDICAID FL2 LEVEL OF CARE SCREENING TOOL     IDENTIFICATION  Patient Name: [name] Birthdate: 1940-10-02 Sex: male Admission Date (Current Location): 11/24/2015  [facility] and [place] Number:  Producer, television/film/video and Address:  [facility] [place], [address] [place], [place] [address]      Provider Number: [id number]  Attending Physician Name and Address:  Renae Fickle, MD  Relative Name and Phone Number:  Jansiel Wilke, wife    Current Level of Care: Hospital Recommended Level of Care: Skilled Nursing Facility Prior Approval Number:    Date Approved/Denied:   PASRR Number: [id number] (Eff. 11/27/15)  Discharge Plan: SNF    Current Diagnoses: Patient Active Problem List   Diagnosis Date Noted  . Cellulitis of second toe of left foot   . Acute left PCA stroke (HCC) 11/25/2015  . Stroke (cerebrum) (HCC) 11/25/2015  . Stroke (HCC) 11/25/2015  . Wound dehiscence 03/03/2015  . Lumbar stenosis with neurogenic claudication 02/02/2015  . Cervical spondylosis with myelopathy 04/17/2014  . Pre-operative cardiovascular examination 04/14/2014  . ICD (implantable cardioverter-defibrillator), biventricular, in situ 11/19/2013  . Cardiac resynchronization therapy defibrillator (CRT-D) in place 07/03/2013  . Chronic renal failure 01/24/2013  . CAD (coronary artery disease) 12/10/2012  . Chronic systolic heart failure (HCC) 11/20/2012  . Cardiomyopathy, ischemic 11/20/2012  . Atrial flutter (HCC) 11/08/2012  . Dilated cardiomyopathy (HCC) 11/01/2012  . Hypoglycemia 10/29/2012  . Community acquired pneumonia 10/29/2012  . Acute encephalopathy 10/29/2012  . Cervical stenosis of spine 10/28/2011  . Spondylosis of cervical joint 10/28/2011  . Cervical myelopathy (HCC) 10/28/2011  . Quadriparesis (HCC) 10/28/2011  . DVT of right proximal brachial through the distal axillary vein, acute 10/06/2011  . Constipation due to pain medication  10/06/2011  . Venous insufficiency 10/05/2011  . 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 Hyperlipidemia 10/05/2011  . Normocytic anemia 10/03/2011  . Falls frequently 10/03/2011  . Diabetes mellitus, type 2 (HCC) 10/03/2011  . HTN (hypertension) 10/03/2011  . Gait abnormality secondary to lumbar spinal stenosis 10/03/2011  . Arthritis 10/03/2011    Orientation RESPIRATION BLADDER Height & Weight     Self, Situation, Place  Normal Continent Weight: 185 lb 9.6 oz (84.2 kg) Height:  5\' 10"  (177.8 cm)  BEHAVIORAL SYMPTOMS/MOOD NEUROLOGICAL BOWEL NUTRITION STATUS      Continent Diet (Heart healthy)  AMBULATORY STATUS COMMUNICATION OF NEEDS Skin   Limited Assist Verbally Normal                       Personal Care Assistance Level of Assistance  Bathing, Feeding, Dressing Bathing Assistance: Limited assistance Feeding assistance: Independent (help with set-up, safety with sitting) Dressing Assistance: Limited assistance     Functional Limitations Info  Sight, Hearing, Speech Sight Info: Impaired Hearing Info: Adequate Speech Info: Adequate    SPECIAL CARE FACTORS FREQUENCY  PT (By licensed PT), OT (By licensed OT), Speech therapy     PT Frequency: Evaluated 8/23 and a minimum of 3X per week therapy recommended OT Frequency: Evaluated 8/23 and a minimum of 2X per week therapy recommended     Speech Therapy Frequency: Evaluated 8/23      Contractures Contractures Info: Not present    Additional Factors Info  Code Status, Allergies Code Status Info: Full Allergies Info: Acyclovir, Dristan Cold, Predinisone           Current Medications (11/27/2015):  This is the current hospital active medication list Current Facility-Administered Medications  Medication Dose Route Frequency Provider Last Rate Last Dose  . acarbose (PRECOSE) tablet 50 mg  50 mg Oral TID WC Eduard ClosArshad N Kakrakandy, MD   50 mg  at 11/27/15 1827  . carvedilol (COREG) tablet 12.5 mg  12.5 mg Oral BID WC Eduard ClosArshad N Kakrakandy, MD   12.5 mg at 11/27/15 1827  . docusate sodium (COLACE) capsule 300 mg  300 mg Oral BID Eduard ClosArshad N Kakrakandy, MD   300 mg at 11/27/15 1138  . doxycycline (VIBRA-TABS) tablet 100 mg  100 mg Oral Q12H Renae FickleMackenzie Short, MD   100 mg at 11/27/15 1139  . fluticasone (FLONASE) 50 MCG/ACT nasal spray 2 spray  2 spray Each Nare Daily PRN Eduard ClosArshad N Kakrakandy, MD   2 spray at 11/27/15 0840  . furosemide (LASIX) tablet 40 mg  40 mg Oral Daily Renae FickleMackenzie Short, MD   40 mg at 11/27/15 1020  . gabapentin (NEURONTIN) capsule 300 mg  300 mg Oral TID Eduard ClosArshad N Kakrakandy, MD   300 mg at 11/27/15 1603  . hydrALAZINE (APRESOLINE) tablet 37.5 mg  37.5 mg Oral Q8H Eduard ClosArshad N Kakrakandy, MD   37.5 mg at 11/27/15 1452  . HYDROcodone-acetaminophen (NORCO/VICODIN) 5-325 MG per tablet 2 tablet  2 tablet Oral Q8H PRN Eduard ClosArshad N Kakrakandy, MD      . insulin aspart (novoLOG) injection 0-9 Units  0-9 Units Subcutaneous TID WC Eduard ClosArshad N Kakrakandy, MD   3 Units at 11/27/15 1700  . isosorbide mononitrate (IMDUR) 24 hr tablet 30 mg  30 mg Oral Daily Eduard ClosArshad N Kakrakandy, MD   30 mg at 11/27/15 1138  . ivabradine (CORLANOR) tablet 5 mg  5 mg Oral BID WC Eduard ClosArshad N Kakrakandy, MD   5 mg at 11/27/15 1827  . Rivaroxaban (XARELTO) tablet 15 mg  15 mg Oral Q supper Armandina Stammerathan J Batchelder, RPH   15 mg at 11/27/15 1827  . rosuvastatin (CRESTOR) tablet 10 mg  10 mg Oral q1800 Renae FickleMackenzie Short, MD   10 mg at 11/27/15 1827     Discharge Medications: Please see discharge summary for a list of discharge medications.  Relevant Imaging Results:  Relevant Lab Results:   Additional Information ss# [id number].  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  Cristobal Goldmannrawford, Jaben Benegas Bradley, LCSW

## 2015-11-27 NOTE — Clinical Social Work Note (Addendum)
CSW received SNF consult and advised that patient ready for discharge today. Completed assessment with patient and wife (full assessment to follow) and communicated that facility preference, [facility] regarding accepting patient today. CSW initially advised that patient would not be able to d/c today as they would need to get authorization [facility] Medicare) within 24 hours of patient's admission to facility. CSW later advised that patient can discharge tomorrow (Sat. 8/26) as admissions paperwork will need to be completed prior to patient coming to facility. Weekend CSW advised of Saturday discharge to facility. D/C summary and other clinicals transmitted to facility.  Genelle Bal, MSW, LCSW Licensed Clinical Social Worker Clinical Social Work Department [employer] [phone]

## 2015-11-27 NOTE — Progress Notes (Signed)
Telemetry called to report that patient was showing dual pace on monitor. MD notified.

## 2015-11-27 NOTE — Discharge Summary (Addendum)
Physician Discharge Summary  ENRRIQUE MIERZWA TZG:[id number] DOB: 03-11-1941 DOA: 11/24/2015  PCP: Maggie Font, MD  Admit date: 11/24/2015 Discharge date: 11/28/2015  Admitted From: home  Disposition:  SNF  Recommendations for Outpatient Follow-up:  1. Follow up with Stroke Clinic at [facility] with Dr. Antony Contras in 2 months 2. Please obtain CBC in one week 3. Ongoing risk factor control by Primary Care Physician 4. To SNF for ongoing PT/OT 5. Doxycycline through 8/29, then stop  Discharge Condition:  Stable, improved CODE STATUS:  full  Diet recommendation:  Healthy heart   Brief/Interim Summary:  Kavan Devan McDanielis a 75 y.o.malewith CAD, chronic systolic heart failure status post AICD placement, chronic kidney disease stage 3-4, diabetes mellitus type 2 was referred to the ER after patient was found to have difficulty bringing out words. Patient had experienced difficulty speaking words for 2 days prior to admission. Patient had followed up with this orthopedic surgeon Dr. Sharol Given for his left foot second toe discoloration and discharge.  Dr. Sharol Given noted patient's difficulty speaking and referred him to the ER. CT of the head showed stroke with possible mass effect. He denied difficulty swallowing blurred vision or moving upper or lower extremities.On-call neurologist Dr. Nicole Kindred recommended admission for stroke workup, but he was outside the timeframe for tPA. On exam patient also had left foot second toe discoloration.  Interrogation of his pacemaker revealed episodes of atrial flutter, likely contributing to his stroke.  He also had some intracranial atherosclerosis and stenosis.  He was started on xarelto.  He was seen by physical and occupational therapy who recommended SNF for ongoing rehabilitation.  He will also need speech therapy.     Discharge Diagnoses:  Principal Problem:   Acute left PCA stroke (HCC) Active Problems:   Diabetes mellitus, type 2  (HCC)   HTN (hypertension)   Atrial flutter (HCC)   Chronic systolic heart failure (HCC)   CAD (coronary artery disease)   ICD (implantable cardioverter-defibrillator), biventricular, in situ   Stroke (cerebrum) (HCC)   Stroke (HCC)   Cellulitis of second toe of left foot   Encephalopathy, metabolic  Acute left PCA stroke resulting in dysarthria, expressive aphasia and metabolic encephalopathy/confusion.  Unable to get MRI due to pacemaker.   -  CT angio head and neck demonstrated multifocal severe stenosis of the left posterior cerebral artery with occlusion of the mid P2 segment, right internal carotid artery origin stenosis of 55%. The again noted evidence of acute infarction throughout the distribution of the left PCA. -  Echocardiogram:  Preserved EF, mild aortic valve stenosis -  Tele:  Biventricularly paced.  Pacemaker interrogation demonstrated brief episodes of atrial flutter.   -  Started Xarelto -  LDL 82 > changed to high dose rosuvastatin -  A1c 5.6 -  PT/OT recommended skilled nursing facility placement -  SLP recommended ongoing speech therapy  Left foot second toe infection -  ESR 33 -  Continue doxycycline through 8/29, then stop  Diabetes mellitus type 2, A1c 5.6 -  Discontinued SU and metformin due to risk of hypoglycemia   Chronic systolic heart failure status post AICD placement -  Continue lasix -  Continue carvedilol -  No ARB/ACEI due to CKD -  Continue ivabradine -  Continue imdur/hydralazine  CAD, stable, continued aspirin, BB, changed to high dose statin  Hypertension, stable, continued carvedilol, hydralazine  CKD stage 3, creatinine at baseline of 1.4  Discharge Instructions  Discharge Instructions    (HEART  FAILURE PATIENTS) Call MD:  Anytime you have any of the following symptoms: 1) 3 pound weight gain in 24 hours or 5 pounds in 1 week 2) shortness of breath, with or without a dry hacking cough 3) swelling in the hands, feet or  stomach 4) if you have to sleep on extra pillows at night in order to breathe.    Complete by:  As directed   Ambulatory referral to Neurology    Complete by:  As directed   Stroke patient. Dr. Leonie Man prefers follow up in 2 months   Call MD for:  difficulty breathing, headache or visual disturbances    Complete by:  As directed   Call MD for:  extreme fatigue    Complete by:  As directed   Call MD for:  hives    Complete by:  As directed   Call MD for:  persistant dizziness or light-headedness    Complete by:  As directed   Call MD for:  persistant nausea and vomiting    Complete by:  As directed   Call MD for:  severe uncontrolled pain    Complete by:  As directed   Call MD for:  temperature >100.4    Complete by:  As directed   Diet - low sodium heart healthy    Complete by:  As directed   Increase activity slowly    Complete by:  As directed       Medication List    STOP taking these medications   aspirin EC 81 MG tablet   cyclobenzaprine 5 MG tablet Commonly known as:  FLEXERIL   phenylephrine 0.25 % nasal spray Commonly known as:  NEO-SYNEPHRINE   pravastatin 40 MG tablet Commonly known as:  PRAVACHOL   pseudoephedrine-guaifenesin 60-600 MG 12 hr tablet Commonly known as:  MUCINEX D     TAKE these medications   acarbose 50 MG tablet Commonly known as:  PRECOSE Take 50 mg by mouth 3 (three) times daily with meals. Take 1 tablet 1-3 times a day   carvedilol 12.5 MG tablet Commonly known as:  COREG Take 1 tablet (12.5 mg total) by mouth 2 (two) times daily with a meal.   docusate sodium 100 MG capsule Commonly known as:  COLACE Take 300 mg by mouth 2 (two) times daily.   doxycycline 100 MG tablet Commonly known as:  VIBRA-TABS Take 1 tablet (100 mg total) by mouth every 12 (twelve) hours.   fluticasone 50 MCG/ACT nasal spray Commonly known as:  FLONASE Place 2 sprays into the nose daily as needed for allergies.   furosemide 40 MG tablet Commonly known as:   LASIX Take 1 tablet (40 mg total) by mouth daily. What changed:  when to take this   gabapentin 300 MG capsule Commonly known as:  NEURONTIN Take 300 mg by mouth 3 (three) times daily.   glimepiride 2 MG tablet Commonly known as:  AMARYL Take 2 mg by mouth 2 (two) times daily.   hydrALAZINE 25 MG tablet Commonly known as:  APRESOLINE TAKE ONE-HALF TABLET BY MOUTH THREE TIMES DAILY   HYDROcodone-acetaminophen 5-325 MG tablet Commonly known as:  NORCO/VICODIN Take 2 tablets by mouth every 8 (eight) hours as needed for moderate pain.   isosorbide mononitrate 30 MG 24 hr tablet Commonly known as:  IMDUR Take 1 tablet (30 mg total) by mouth daily.   ivabradine 5 MG Tabs tablet Commonly known as:  CORLANOR Take 1 tablet (5 mg total) by mouth 2 (  two) times daily with a meal.   magnesium hydroxide 400 MG/5ML suspension Commonly known as:  MILK OF MAGNESIA Take 30 mL by mouth daily as needed for constipation.   metFORMIN 500 MG tablet Commonly known as:  GLUCOPHAGE Take 500 mg by mouth 2 (two) times daily with a meal.   Rivaroxaban 15 MG Tabs tablet Commonly known as:  XARELTO Take 1 tablet (15 mg total) by mouth daily with supper.   rosuvastatin 10 MG tablet Commonly known as:  CRESTOR Take 1 tablet (10 mg total) by mouth daily at 6 PM.   saw palmetto 500 MG capsule Take 1,350 mg by mouth 3 (three) times daily.      Follow-up Information    SETHI,PRAMOD, MD Follow up in 2 month(s).   Specialties:  Neurology, Radiology Why:  stroke clinic. office will call you with appt date and time. Contact information: [address] [place] [address] [phone]        Maggie Font, MD Follow up in 1 month(s).   Specialty:  Family Medicine Contact information: [address] [place] [address] [phone]          Allergies  Allergen Reactions  . Acyclovir And Related Other (See Comments)    unknown  . Dristan Cold  [Chlorphen-Pe-Acetaminophen] Anxiety    Makes pt feel "wired up"  . Prednisone Other (See Comments)    Messes with blood sugar levels     Consultations: Neurology, Dr. Leonie Man   Procedures/Studies: Ct Angio Head W Or Wo Contrast  Result Date: 11/25/2015 CLINICAL DATA:  Left PCA infarct. EXAM: CT ANGIOGRAPHY HEAD AND NECK TECHNIQUE: Multidetector CT imaging of the head and neck was performed using the standard protocol during bolus administration of intravenous contrast. Multiplanar CT image reconstructions and MIPs were obtained to evaluate the vascular anatomy. Carotid stenosis measurements (when applicable) are obtained utilizing NASCET criteria, using the distal internal carotid diameter as the denominator. CONTRAST:  50 mL Isovue 370 COMPARISON:  Head CT 11/24/2015 FINDINGS: CTA NECK Aortic arch: 3 vessel aortic arch branching pattern. There is atherosclerotic calcification within the aortic arch and within the proximal arch vessels. Subclavian arteries are patent bilaterally. Right carotid system: There is multifocal atherosclerotic calcification within the right common carotid artery without significant stenosis. There is atherosclerotic calcification at the carotid bifurcation with stenosis measuring 55%. The remainder of the is left internal carotid artery is normal. Left carotid system: There is multifocal atherosclerotic plaque and calcification within the left common carotid artery without hemodynamically significant stenosis. There is atherosclerotic calcification at the carotid bifurcation without hemodynamically significant stenosis. Vertebral arteries:There are several system is left dominant. The left vertebral artery origin is widely patent. There is narrowing of the right vertebral origin. The right vertebral artery is diminutive along its entire course. There is no hemodynamically significant stenosis of the left vertebral artery V1, V2 and V3 segments. Skeleton: There is cervical  spine anterior fusion hardware extending from C3 the C6. No lytic or blastic osseous lesions. No advanced spinal canal stenosis. Other neck: The nasopharynx, oropharynx, hypopharynx and larynx are normal. Hypodense thyroid nodules measure up to 6 mm. No cervical lymphadenopathy. Normal salivary glands. Upper chest: There is an incidentally noted azygos fissure. Subpleural nodular opacity in the left upper lobe measures 6 mm (series 5, image 7). CTA HEAD Intracranial internal carotid arteries: There is moderate atherosclerotic calcification of the proximal intracranial internal carotid arteries, left-greater-than-right. Anterior cerebral arteries: Normal. Middle cerebral arteries: Normal.  Posterior communicating arteries: Not seen on the right. Small PCOM on the left. Posterior cerebral arteries: There is multifocal severe narrowing of the left posterior cerebral artery with complete loss of opacification of multiple locations (image 21 series 12). The right PCA is normal. Basilar artery: Multifocal atherosclerotic calcification of the basilar artery. Vertebral arteries: The right vertebral artery is diminutive and appears to terminate in PICA. Superior cerebellar arteries: Normal. Anterior inferior cerebellar arteries: Normal on the left. Not seen on the right. Posterior inferior cerebellar arteries: Normal. Venous sinuses: Hypoplastic left transverse sinus is likely a normal congenital variant. Otherwise normal. Anatomic variants: None Delayed phase: Re- demonstration of extensive hypoattenuation within the left PCA distribution compatible with acute infarct. IMPRESSION: 1. Multifocal severe stenosis of the left posterior cerebral artery with occlusion of the mid P2 segment. 2. Right internal carotid artery origin stenosis measuring 55%. 3. Redemonstration of hypoattenuation throughout the left PCA distribution compatible with acute infarction. No new mass effect or worsening midline shift. Electronically Signed    By: Ulyses Jarred M.D.   On: 11/25/2015 17:28   Ct Head Wo Contrast  Result Date: 11/24/2015 CLINICAL DATA:  Confusion and aphasia for 2 days. EXAM: CT HEAD WITHOUT CONTRAST TECHNIQUE: Contiguous axial images were obtained from the base of the skull through the vertex without intravenous contrast. COMPARISON:  CT of the head 10/29/2012 FINDINGS: Brain: There is a large acute to subacute infarct in the left PCA territory involving left occipital and temporal lobes with small lacunar infarcts in the left thalamus, likely from occluded perforating branches. There is significant vasogenic edema compressing the posterior horn of the left lateral ventricle. No evidence of hemorrhage, hydrocephalus or mass lesion. Vascular: Calcific atherosclerotic disease at the skullbase. Skull: No acute fractures. Sinuses/Orbits: No acute finding. Other: None. IMPRESSION: Large nonhemorrhagic acute to subacute left PCA territory infarct. Mass effect to the occipital horn of the left lateral ventricle without evidence of frank hydrocephalus. Critical Value/emergent results were called by telephone at the time of interpretation on 11/24/2015 at 6:30 pm to Dr. Varney Biles , who verbally acknowledged these results. Electronically Signed   By: Fidela Salisbury M.D.   On: 11/24/2015 18:35   Ct Angio Neck W Or Wo Contrast  Result Date: 11/25/2015 CLINICAL DATA:  Left PCA infarct. EXAM: CT ANGIOGRAPHY HEAD AND NECK TECHNIQUE: Multidetector CT imaging of the head and neck was performed using the standard protocol during bolus administration of intravenous contrast. Multiplanar CT image reconstructions and MIPs were obtained to evaluate the vascular anatomy. Carotid stenosis measurements (when applicable) are obtained utilizing NASCET criteria, using the distal internal carotid diameter as the denominator. CONTRAST:  50 mL Isovue 370 COMPARISON:  Head CT 11/24/2015 FINDINGS: CTA NECK Aortic arch: 3 vessel aortic arch branching  pattern. There is atherosclerotic calcification within the aortic arch and within the proximal arch vessels. Subclavian arteries are patent bilaterally. Right carotid system: There is multifocal atherosclerotic calcification within the right common carotid artery without significant stenosis. There is atherosclerotic calcification at the carotid bifurcation with stenosis measuring 55%. The remainder of the is left internal carotid artery is normal. Left carotid system: There is multifocal atherosclerotic plaque and calcification within the left common carotid artery without hemodynamically significant stenosis. There is atherosclerotic calcification at the carotid bifurcation without hemodynamically significant stenosis. Vertebral arteries:There are several system is left dominant. The left vertebral artery origin is widely patent. There is narrowing of the right vertebral origin. The right vertebral artery is diminutive along its entire course.  There is no hemodynamically significant stenosis of the left vertebral artery V1, V2 and V3 segments. Skeleton: There is cervical spine anterior fusion hardware extending from C3 the C6. No lytic or blastic osseous lesions. No advanced spinal canal stenosis. Other neck: The nasopharynx, oropharynx, hypopharynx and larynx are normal. Hypodense thyroid nodules measure up to 6 mm. No cervical lymphadenopathy. Normal salivary glands. Upper chest: There is an incidentally noted azygos fissure. Subpleural nodular opacity in the left upper lobe measures 6 mm (series 5, image 7). CTA HEAD Intracranial internal carotid arteries: There is moderate atherosclerotic calcification of the proximal intracranial internal carotid arteries, left-greater-than-right. Anterior cerebral arteries: Normal. Middle cerebral arteries: Normal. Posterior communicating arteries: Not seen on the right. Small PCOM on the left. Posterior cerebral arteries: There is multifocal severe narrowing of the left  posterior cerebral artery with complete loss of opacification of multiple locations (image 21 series 12). The right PCA is normal. Basilar artery: Multifocal atherosclerotic calcification of the basilar artery. Vertebral arteries: The right vertebral artery is diminutive and appears to terminate in PICA. Superior cerebellar arteries: Normal. Anterior inferior cerebellar arteries: Normal on the left. Not seen on the right. Posterior inferior cerebellar arteries: Normal. Venous sinuses: Hypoplastic left transverse sinus is likely a normal congenital variant. Otherwise normal. Anatomic variants: None Delayed phase: Re- demonstration of extensive hypoattenuation within the left PCA distribution compatible with acute infarct. IMPRESSION: 1. Multifocal severe stenosis of the left posterior cerebral artery with occlusion of the mid P2 segment. 2. Right internal carotid artery origin stenosis measuring 55%. 3. Redemonstration of hypoattenuation throughout the left PCA distribution compatible with acute infarction. No new mass effect or worsening midline shift. Electronically Signed   By: Ulyses Jarred M.D.   On: 11/25/2015 17:28   Dg Foot 2 Views Left  Result Date: 11/25/2015 CLINICAL DATA:  Cellulitis of left second toe for weeks. EXAM: LEFT FOOT - 2 VIEW COMPARISON:  None. FINDINGS: No erosion or joint destruction to suggest acute osteomyelitis. Third and fourth digital amputations. Hammertoe deformities of the second and fifth digits. Degenerative spurring about the first MTP joint with possible gouty erosion medially in the metatarsal. Osteopenia. Arterial calcification. Dorsal midfoot and calcaneal spurring. IMPRESSION: 1. No suspected osteomyelitis. 2. Possible gouty erosion in the first metatarsal head. Electronically Signed   By: Monte Fantasia M.D.   On: 11/25/2015 03:18     Subjective: Confused.  Was hungry and feeling better after breakfast.  Denies focal numbness or weakness.  Denies slurred speech.     Discharge Exam: Vitals:   11/28/15 0924 11/28/15 1307  BP: 116/67 139/66  Pulse: 83 78  Resp: 18 18  Temp: 97.4 F (36.3 C) 98.1 F (36.7 C)   Vitals:   11/28/15 0106 11/28/15 0447 11/28/15 0924 11/28/15 1307  BP: (!) 147/61 140/63 116/67 139/66  Pulse: 77 79 83 78  Resp: _0 Temp: 98 F (36.7 C) 98.3 F (36.8 C) 97.4 F (36.3 C) 98.1 F (36.7 C)  TempSrc: Oral Oral Oral Oral  SpO2: 99% 99% 100% 98%  Weight:      Height:        General exam:  Adult Male.  No acute distress.  HEENT:  NCAT, MMM Respiratory system: Clear to auscultation bilaterally Cardiovascular system: Regular rate and rhythm, normal S1/S2.  Slight heave. 3/6 systolic murmur at the left sternal border.  Warm extremities Gastrointestinal system: Normal active bowel sounds, soft, nondistended, nontender. MSK:  Normal tone and bulk, no lower extremity  edema, mild dark erythema of bilateral feet, second toe of the right foot and several toes on the left foot.  Abrasions on bilateral shins have decreasing surrounding erythema. Neuro:  Cranial nerves II through XII grossly intact. No obvious facial droop. No obvious word finding difficulties during my exam. Strength 5 out of 5 throughout, sensation intact to light touch on arms and legs Psych:  Confused.  Not oriented to place or time or reason for hospitalization    The results of significant diagnostics from this hospitalization (including imaging, microbiology, ancillary and laboratory) are listed below for reference.     Microbiology: No results found for this or any previous visit (from the past 240 hour(s)).   Labs: BNP (last 3 results)  Recent Labs  12/16/14 1645  BNP 834.1*   Basic Metabolic Panel:  Recent Labs Lab 11/24/15 1719 11/25/15 0343 11/27/15 0606 11/28/15 0344  NA 137 138 138 139  K 4.3 4.0 4.2 3.9  CL 104 103 106 105  CO2 _0 GLUCOSE 53* 121* 136* 123*  BUN 25* 27* 29* 30*  CREATININE 1.41* 1.41* 1.44*  1.41*  CALCIUM 9.8 9.7 9.2 9.7   Liver Function Tests:  Recent Labs Lab 11/24/15 1719 11/25/15 0343  AST 19 17  ALT 8* 10*  ALKPHOS 58 56  BILITOT 0.4 0.7  PROT 7.4 7.1  ALBUMIN 4.0 3.8   No results for input(s): LIPASE, AMYLASE in the last 168 hours. No results for input(s): AMMONIA in the last 168 hours. CBC:  Recent Labs Lab 11/24/15 1719 11/25/15 0327  WBC 8.0 6.5  NEUTROABS 5.1  --   HGB 11.9* 11.2*  HCT 37.5* 35.2*  MCV 94.2 93.6  PLT 270 223   Cardiac Enzymes: No results for input(s): CKTOTAL, CKMB, CKMBINDEX, TROPONINI in the last 168 hours. BNP: Invalid input(s): POCBNP CBG:  Recent Labs Lab 11/27/15 1131 11/27/15 1644 11/27/15 2120 11/28/15 0615 11/28/15 1133  GLUCAP 209* 136* 106* 136* 184*   D-Dimer No results for input(s): DDIMER in the last 72 hours. Hgb A1c No results for input(s): HGBA1C in the last 72 hours. Lipid Profile No results for input(s): CHOL, HDL, LDLCALC, TRIG, CHOLHDL, LDLDIRECT in the last 72 hours. Thyroid function studies No results for input(s): TSH, T4TOTAL, T3FREE, THYROIDAB in the last 72 hours.  Invalid input(s): FREET3 Anemia work up No results for input(s): VITAMINB12, FOLATE, FERRITIN, TIBC, IRON, RETICCTPCT in the last 72 hours. Urinalysis    Component Value Date/Time   COLORURINE YELLOW 03/13/2015 0653   APPEARANCEUR CLEAR 03/13/2015 0653   LABSPEC 1.014 03/13/2015 0653   PHURINE 6.0 03/13/2015 0653   GLUCOSEU NEGATIVE 03/13/2015 0653   HGBUR NEGATIVE 03/13/2015 0653   BILIRUBINUR NEGATIVE 03/13/2015 0653   KETONESUR NEGATIVE 03/13/2015 0653   PROTEINUR NEGATIVE 03/13/2015 0653   UROBILINOGEN 0.2 10/29/2012 1316   NITRITE NEGATIVE 03/13/2015 0653   LEUKOCYTESUR NEGATIVE 03/13/2015 0653   Sepsis Labs Invalid input(s): PROCALCITONIN,  WBC,  LACTICIDVEN   Time coordinating discharge: Over 30 minutes  SIGNED:   Janece Canterbury, MD  [facility] 11/28/2015, 5:39 PM Pager   If 7PM-7AM,  please contact night-coverage [email or web address] Password [email or web address]

## 2015-11-28 DIAGNOSIS — I4892 Unspecified atrial flutter: Secondary | ICD-10-CM

## 2015-11-28 DIAGNOSIS — M6281 Muscle weakness (generalized): Secondary | ICD-10-CM | POA: Diagnosis not present

## 2015-11-28 DIAGNOSIS — L03032 Cellulitis of left toe: Secondary | ICD-10-CM | POA: Diagnosis not present

## 2015-11-28 DIAGNOSIS — E119 Type 2 diabetes mellitus without complications: Secondary | ICD-10-CM | POA: Diagnosis not present

## 2015-11-28 DIAGNOSIS — N183 Chronic kidney disease, stage 3 (moderate): Secondary | ICD-10-CM | POA: Diagnosis not present

## 2015-11-28 DIAGNOSIS — I69320 Aphasia following cerebral infarction: Secondary | ICD-10-CM | POA: Diagnosis not present

## 2015-11-28 DIAGNOSIS — I10 Essential (primary) hypertension: Secondary | ICD-10-CM | POA: Diagnosis not present

## 2015-11-28 DIAGNOSIS — Z7901 Long term (current) use of anticoagulants: Secondary | ICD-10-CM | POA: Diagnosis not present

## 2015-11-28 DIAGNOSIS — I69322 Dysarthria following cerebral infarction: Secondary | ICD-10-CM | POA: Diagnosis not present

## 2015-11-28 DIAGNOSIS — N182 Chronic kidney disease, stage 2 (mild): Secondary | ICD-10-CM | POA: Diagnosis not present

## 2015-11-28 DIAGNOSIS — R4701 Aphasia: Secondary | ICD-10-CM | POA: Diagnosis not present

## 2015-11-28 DIAGNOSIS — R413 Other amnesia: Secondary | ICD-10-CM | POA: Diagnosis not present

## 2015-11-28 DIAGNOSIS — I63532 Cerebral infarction due to unspecified occlusion or stenosis of left posterior cerebral artery: Secondary | ICD-10-CM | POA: Diagnosis not present

## 2015-11-28 DIAGNOSIS — I5022 Chronic systolic (congestive) heart failure: Secondary | ICD-10-CM | POA: Diagnosis not present

## 2015-11-28 DIAGNOSIS — G9341 Metabolic encephalopathy: Secondary | ICD-10-CM

## 2015-11-28 DIAGNOSIS — Z95810 Presence of automatic (implantable) cardiac defibrillator: Secondary | ICD-10-CM | POA: Diagnosis not present

## 2015-11-28 DIAGNOSIS — I130 Hypertensive heart and chronic kidney disease with heart failure and stage 1 through stage 4 chronic kidney disease, or unspecified chronic kidney disease: Secondary | ICD-10-CM | POA: Diagnosis not present

## 2015-11-28 DIAGNOSIS — D649 Anemia, unspecified: Secondary | ICD-10-CM | POA: Diagnosis not present

## 2015-11-28 DIAGNOSIS — E1122 Type 2 diabetes mellitus with diabetic chronic kidney disease: Secondary | ICD-10-CM | POA: Diagnosis not present

## 2015-11-28 DIAGNOSIS — I2510 Atherosclerotic heart disease of native coronary artery without angina pectoris: Secondary | ICD-10-CM | POA: Diagnosis not present

## 2015-11-28 DIAGNOSIS — R1311 Dysphagia, oral phase: Secondary | ICD-10-CM | POA: Diagnosis not present

## 2015-11-28 DIAGNOSIS — R488 Other symbolic dysfunctions: Secondary | ICD-10-CM | POA: Diagnosis not present

## 2015-11-28 LAB — BASIC METABOLIC PANEL
Anion gap: 9 (ref 5–15)
BUN: 30 mg/dL — AB (ref 6–20)
CALCIUM: 9.7 mg/dL (ref 8.9–10.3)
CO2: 25 mmol/L (ref 22–32)
Chloride: 105 mmol/L (ref 101–111)
Creatinine, Ser: 1.41 mg/dL — ABNORMAL HIGH (ref 0.61–1.24)
GFR calc Af Amer: 55 mL/min — ABNORMAL LOW (ref >60)
GFR, EST NON AFRICAN AMERICAN: 48 mL/min — AB (ref >60)
GLUCOSE: 123 mg/dL — AB (ref 65–99)
Potassium: 3.9 mmol/L (ref 3.5–5.1)
SODIUM: 139 mmol/L (ref 135–145)

## 2015-11-28 LAB — GLUCOSE, CAPILLARY
GLUCOSE-CAPILLARY: 136 mg/dL — AB (ref 65–99)
Glucose-Capillary: 184 mg/dL — ABNORMAL HIGH (ref 65–99)

## 2015-11-28 NOTE — Progress Notes (Signed)
Patient seen and examined.  More oriented today.  Denies focal numbness, tingling, weakness, slurred speech, or difficultly speaking.  Aware of where he is and why he is here.  Exam unchanged.  Strength 5/5 throughout, sensation intact.  Speech not slurred and occasionally slow to answer a question.  No changes to discharge medications or discharge summary by myself on 11/27/2015.

## 2015-11-28 NOTE — Clinical Social Work Placement (Signed)
   CLINICAL SOCIAL WORK PLACEMENT  NOTE  Date:  11/28/2015  Patient Details  Name: [name] MRN: [id number] Date of Birth: 11/01/1940  Clinical Social Work is seeking post-discharge placement for this patient at the Skilled  Nursing Facility level of care (*CSW will initial, date and re-position this form in  chart as items are completed):  No (A facility preference was provided.)   Patient/family provided with [facility] Clinical Social Work Department's list of facilities offering this level of care within the geographic area requested by the patient (or if unable, by the patient's family).  Yes   Patient/family informed of their freedom to choose among providers that offer the needed level of care, that participate in Medicare, Medicaid or managed care program needed by the patient, have an available bed and are willing to accept the patient.  No   Patient/family informed of [facility]'s ownership interest in [facility] and [facility], as well as of the fact that they are under no obligation to receive care at these facilities.  PASRR submitted to EDS on 11/27/15     PASRR number received on 11/27/15     Existing PASRR number confirmed on       FL2 transmitted to all facilities in geographic area requested by pt/family on 11/27/15     FL2 transmitted to all facilities within larger geographic area on       Patient informed that his/her managed care company has contracts with or will negotiate with certain facilities, including the following:        Yes   Patient/family informed of bed offers received.  Patient chooses bed at [facility]     Physician recommends and patient chooses bed at      Patient to be transferred to [facility] on 11/28/15.  Patient to be transferred to facility by Wife calling transport     Patient family notified on 11/28/15 of transfer.  Name of family member notified:  Wife at bedside      PHYSICIAN Please sign FL2     Additional Comment:    _______________________________________________ Mearl Latin, LCSWA 11/28/2015, 4:09 PM

## 2015-11-28 NOTE — Progress Notes (Signed)
Patient will DC to: [facility] Anticipated DC date: 11/28/15 Family notified: Spouse Transport by: Spouse arranging transport   Per MD patient ready for DC to [facility]. RN, patient, patient's family, and facility notified of DC. RN given number for report. Paperwork completed and faxed to [facility].  CSW signing off.  Cristobal Goldmann, [place] Clinical Social Worker [phone]

## 2015-11-28 NOTE — Clinical Social Work Note (Signed)
Clinical Social Work Assessment  Patient Details  Name: [name] MRN: [id number] Date of Birth: 09/19/1940  Date of referral:  11/26/15               Reason for consult:  Facility Placement                Permission sought to share information with:  Family Supports Permission granted to share information::  No (Wife at the bedside and was permitted by patient to participated in conversation/assessment regarding SNF placement)  Name::     [name]  Agency::     Relationship::  Spouse  Contact Information:  [phone]  Housing/Transportation Living arrangements for the past 2 months:  Single Family Home Source of Information:  Patient, Spouse Patient Interpreter Needed:  None Criminal Activity/Legal Involvement Pertinent to Current Situation/Hospitalization:  No - Comment as needed Significant Relationships:  Spouse Lives with:  Spouse Do you feel safe going back to the place where you live?  No (Patient in agreement with ST rehab prior to returning home) Need for family participation in patient care:  Yes (Comment)  Care giving concerns:  Patient and spouse in agreement that ST rehab will benefit patient prior to returning home.   Social Worker assessment / plan:  On 8/25 CSW talked with patient and wife at the bedside regarding discharge planning and recommendation of ST rehab. Mr. Silver was in bed and was awake, alert, oriented and open to talking with CSW. Wife was at the bedside and was an active participant in the conversation. When asked about family support, patient reported that they had 2 children, but one is deceased and they are estranged from their other child.  Both in agreement with ST rehab and their facility preference is [facility] as patient has been there before and wife indicated that the rehab was very good.  Employment status:  Retired Medical illustrator) PT Recommendations:  Skilled Nursing  Facility Information / Referral to community resources:  Other (Comment Required) (Patient/wife not provided with SNF list as facility preference provided.)  Patient/Family's Response to care:  No concerns expressed regarding care during hospitalization.  Patient/Family's Understanding of and Emotional Response to Diagnosis, Current Treatment, and Prognosis:  Not discussed.  Emotional Assessment Appearance:  Appears stated age Attitude/Demeanor/Rapport:  Other (Appropriate) Affect (typically observed):  Appropriate, Pleasant Orientation:  Oriented to Self, Oriented to Place, Oriented to Situation Alcohol / Substance use:  Never Used Psych involvement (Current and /or in the community):  No (Comment)  Discharge Needs  Concerns to be addressed:  Discharge Planning Concerns Readmission within the last 30 days:  No Current discharge risk:  None Barriers to Discharge:  No Barriers Identified   Cristobal Goldmann, LCSW 11/28/2015, 1:31 AM

## 2015-11-28 NOTE — Clinical Social Work Placement (Signed)
   CLINICAL SOCIAL WORK PLACEMENT  NOTE  Date:  11/28/2015  Patient Details  Name: [name] MRN: [id number] Date of Birth: 1940/09/15  Clinical Social Work is seeking post-discharge placement for this patient at the Skilled  Nursing Facility level of care (*CSW will initial, date and re-position this form in  chart as items are completed):  No (A facility preference was provided.)   Patient/family provided with [facility] Clinical Social Work Department's list of facilities offering this level of care within the geographic area requested by the patient (or if unable, by the patient's family).  Yes   Patient/family informed of their freedom to choose among providers that offer the needed level of care, that participate in Medicare, Medicaid or managed care program needed by the patient, have an available bed and are willing to accept the patient.  No   Patient/family informed of [facility]'s ownership interest in [facility] and [facility], as well as of the fact that they are under no obligation to receive care at these facilities.  PASRR submitted to EDS on 11/27/15     PASRR number received on 11/27/15     Existing PASRR number confirmed on       FL2 transmitted to all facilities in geographic area requested by pt/family on 11/27/15     FL2 transmitted to all facilities within larger geographic area on       Patient informed that his/her managed care company has contracts with or will negotiate with certain facilities, including the following:        Yes   Patient/family informed of bed offers received.  Patient chooses bed at  [facility]     Physician recommends and patient chooses bed at      Patient to be transferred to   on  .  Patient to be transferred to facility by       Patient family notified on   of transfer.  Name of family member notified:        PHYSICIAN       Additional Comment:     _______________________________________________ Cristobal Goldmann, LCSW 11/28/2015, 1:39 AM

## 2015-12-01 ENCOUNTER — Encounter: Payer: Self-pay | Admitting: Internal Medicine

## 2015-12-01 ENCOUNTER — Non-Acute Institutional Stay (SKILLED_NURSING_FACILITY): Payer: Medicare Other | Admitting: Internal Medicine

## 2015-12-01 DIAGNOSIS — I63532 Cerebral infarction due to unspecified occlusion or stenosis of left posterior cerebral artery: Secondary | ICD-10-CM

## 2015-12-01 DIAGNOSIS — E1122 Type 2 diabetes mellitus with diabetic chronic kidney disease: Secondary | ICD-10-CM

## 2015-12-01 DIAGNOSIS — R413 Other amnesia: Secondary | ICD-10-CM | POA: Insufficient documentation

## 2015-12-01 DIAGNOSIS — L03032 Cellulitis of left toe: Secondary | ICD-10-CM

## 2015-12-01 DIAGNOSIS — N182 Chronic kidney disease, stage 2 (mild): Secondary | ICD-10-CM

## 2015-12-01 DIAGNOSIS — I4892 Unspecified atrial flutter: Secondary | ICD-10-CM

## 2015-12-01 NOTE — Progress Notes (Signed)
Facility Location: [facility]  Room Number: 120-A  Code Status: Full Code  PCP: Evlyn CourierGerald K Hill, MD [address] [place] [id number]    This is a comprehensive [facility] performed on this date less than 30 days from date of admission. Included are preadmission medical/surgical history;reconciled medication list; family history; social history and comprehensive review of systems.  Corrections and additions to the records were documented . Comprehensive physical exam was also performed. Additionally a clinical summary was entered for each active diagnosis pertinent to this admission in the Problem List to enhance continuity of care.  HPI: He was hospitalized 8/22-8/26/17 for acute left PCA stroke. Presentation was  expressive aphasia, difficulty "bringing out words" and some confusion. The symptoms actually started 2 days prior to admission according to his wife who alerted his orthopedist at a scheduled follow up visit. He was being seen for an infection of the left second toe. Dr Lajoyce Cornersuda noted the dysphagia and referred him to the emergency room.  CT revealed stroke with possible mass effect. He was outside the timeframe for tPA. MRI was not an option due to his pacemaker. Interrogation of his pacemaker revealed episodes of atrial flutter, felt to be a contributing factor to his stroke. Atherosclerosis and stenosis were also noted. Xarelto was initiated. SNF was recommended for ongoing rehabilitation with PT/OT and speech therapy as well. Doxycycline was to be continued for the left second toe infection until 8/29. His A1c was found to be 5.6%. Sulfonylurea and metformin were discontinued due to the risk of hypoglycemia. While hospitalized glucoses ranged 121-136. Creatinine ranged from 1.41-1.44 and GFR 46-48.  Past medical and surgical history is very complicated with multiple surgeries.He has coronary artery disease & has  had a myocardial infarction. He has had chronic systolic heart failure and is status post AICD placement. He also has hypertension, dyslipidemia, and diabetes with renal disease. He's had depression related to the death of his son in the [employer].  Social history: Reviewed ; he has never smoked  Family history: Verified. His mother may have had a stroke and possible heart attack.  Review of systems:Could not be completed due to dementia. He states he does have some slight headache at present but denies any other symptoms. His wife states that his memory deficit began prior to the stroke as some difficulty remembering dates but is definitely worse since. She states that he had significant visual deficits in addition to dysphasia as manifestations of the stroke.  Physical exam:  Pertinent or positive findings: He has a full beard and mustache. Pattern alopecia is present. He is missing multiple teeth and has poor dentition. Anisocoria is present with the left pupil larger than the right. Heart sounds are distant and slightly irregular. Posterior tibial pulses are decreased. He has trace left ankle edema. Both great toenails are absent. There is slight erythema and swelling of the left second toe. He is missing the third and fourth toes on the left. There is flexion contracture of the fifth left toe. He has slight erythema over the right shin greater than the left. He has excoriations over the shins. He has wasting of the interosseous muscles particularly between the thumb and index fingers. He has isolated onycholysis of the fingernails. He is unable to name the President, year, month, or day. He is also unable to give his wedding anniversary. He was shocked when his wife told him he been married  54 years.  General appearance:Adequately nourished; no acute distress , increased work of breathing is present.   Lymphatic: No lymphadenopathy about the head, neck, axilla . Eyes: No conjunctival  inflammation or lid edema is present. There is no scleral icterus. Ears:  External ear exam shows no significant lesions or deformities.   Nose:  External nasal examination shows no deformity or inflammation. Nasal mucosa are dry without lesions ,exudates Oral exam: lips and gums are healthy appearing.There is no oropharyngeal erythema or exudate . Neck:  No thyromegaly, masses, tenderness noted.    Heart:  No gallop, murmur, click, rub .  Lungs:Chest clear to auscultation without wheezes, rhonchi,rales , rubs. Abdomen:Bowel sounds are normal. Abdomen is soft and nontender with no organomegaly, hernias,masses. GU: deferred . Extremities:  No cyanosis, clubbing  Neurologic exam : Strength equal  in arms but reduced .Strength good in  lower extremities Balance,Rhomberg,finger to nose testing could not be completed due to clinical state Deep tendon reflexes are equal but 0-1/2+ Skin: Warm & dry w/o tenting.   See clinical summary under each active problem in the Problem List with associated updated therapeutic plan

## 2015-12-01 NOTE — Patient Instructions (Addendum)
PT/OT Eucerin twice a day to the shins Defer Aricept or Namenda to PCP Consideration of Orthopedic  Surgery for the cellulitic left second toe would require cardiology reevaluation

## 2015-12-01 NOTE — Assessment & Plan Note (Signed)
Xarelto

## 2015-12-01 NOTE — Assessment & Plan Note (Signed)
Monitor glucoses periodically off the dual therapy

## 2015-12-01 NOTE — Assessment & Plan Note (Signed)
Continue Xarelto 

## 2015-12-01 NOTE — Assessment & Plan Note (Signed)
Consideration of Aricept or Namenda deferred to PCP

## 2015-12-01 NOTE — Assessment & Plan Note (Signed)
Continue doxycycline until 8/29

## 2015-12-02 ENCOUNTER — Telehealth (HOSPITAL_COMMUNITY): Payer: Self-pay | Admitting: *Deleted

## 2015-12-02 NOTE — Telephone Encounter (Signed)
Pt's wife called to let us know pt needs to have a toe amputated by Dr Lajoyce Corners and needs clearance from Dr Shirlee Latch.  Per Dr Shirlee Latch pt will need to be seen before being cleared as he just recently had a stroke.  Pt's wife is agreeable, he is currently sch for 9/15 w/Dr Shirlee Latch, she would like to keep that appt, offered to move it up but she feels the 15th is ok and will give pt more time to recover from stroke, he is currently in [facility] center.

## 2015-12-03 LAB — BASIC METABOLIC PANEL
BUN: 36 mg/dL — AB (ref 4–21)
Creatinine: 1.5 mg/dL — AB (ref 0.6–1.3)
GLUCOSE: 72 mg/dL
Potassium: 4.2 mmol/L (ref 3.4–5.3)
Sodium: 141 mmol/L (ref 137–147)

## 2015-12-03 LAB — CBC AND DIFFERENTIAL
HEMATOCRIT: 32 % — AB (ref 41–53)
HEMOGLOBIN: 10.2 g/dL — AB (ref 13.5–17.5)
PLATELETS: 219 10*3/uL (ref 150–399)
WBC: 6.8 10^3/mL

## 2015-12-11 ENCOUNTER — Encounter: Payer: Self-pay | Admitting: Nurse Practitioner

## 2015-12-11 ENCOUNTER — Non-Acute Institutional Stay (SKILLED_NURSING_FACILITY): Payer: Medicare Other | Admitting: Nurse Practitioner

## 2015-12-11 DIAGNOSIS — D649 Anemia, unspecified: Secondary | ICD-10-CM | POA: Diagnosis not present

## 2015-12-11 DIAGNOSIS — N182 Chronic kidney disease, stage 2 (mild): Secondary | ICD-10-CM

## 2015-12-11 DIAGNOSIS — E1122 Type 2 diabetes mellitus with diabetic chronic kidney disease: Secondary | ICD-10-CM | POA: Diagnosis not present

## 2015-12-11 LAB — CBC AND DIFFERENTIAL
HCT: 32 % — AB (ref 41–53)
HEMOGLOBIN: 10.5 g/dL — AB (ref 13.5–17.5)
Platelets: 214 10*3/uL (ref 150–399)
WBC: 7 10*3/mL

## 2015-12-11 NOTE — Progress Notes (Signed)
Nursing Home Location:    [facility]   Place of Service: SNF (31)  PCP: [name], MD  Allergies  Allergen Reactions  . Acyclovir And Related Other (See Comments)    unknown  . Dristan Cold [Chlorphen-Pe-Acetaminophen] Anxiety    Makes pt feel "wired up"  . Prednisone Other (See Comments)    Messes with blood sugar levels     Chief Complaint  Patient presents with  . Follow-up    lab work     HPI:  Patient is a 75 y.o. male seen today at   [facility] to follow up lab work. He was hospitalized 8/22-8/26/17 for acute left PCA stroke and currently at [facility] for rehab due to aphagia and right sided weakness.  While in the hospital His A1c was found to be 5.6%. Sulfonylurea and metformin were discontinued due to the risk of hypoglycemia. While hospitalized glucoses ranged 121-136. However pt still receiving medication while at [facility]   Follow up cbc obtained due to anemia. Pt has been started on xarelto due to aflutter which was found on interrogation of pacemaker  Review of Systems:  Review of Systems  Constitutional: Negative for activity change, appetite change, chills, fatigue, fever and unexpected weight change.  HENT: Negative for hearing loss and tinnitus.   Eyes: Negative.   Respiratory: Negative for cough and shortness of breath.   Cardiovascular: Negative for chest pain, palpitations and leg swelling.  Gastrointestinal: Negative for abdominal pain, constipation and diarrhea.  Genitourinary: Negative for dysuria.  Musculoskeletal: Negative for arthralgias, back pain and myalgias.  Skin: Negative.  Negative for color change and wound.  Neurological: Positive for speech difficulty (trouble finding words). Negative for dizziness, weakness (improving) and headaches.  Psychiatric/Behavioral: Positive for agitation and confusion. Negative for behavioral problems.    Past Medical History:  Diagnosis Date  . Acute encephalopathy   .  AICD (automatic cardioverter/defibrillator) present   . Anemia   . Arthritis    "all over" (05/16/2013)  . Atrial flutter (HCC)   . CAD (coronary artery disease)   . Cardiomyopathy (HCC)    a. 10/2012 Echo: EF 20-25%, diff HK, mod dil LA/RV/RA.  [facility] Cervical myelopathy (HCC)   . Cervical spinal stenosis   . Chronic systolic congestive heart failure (HCC)   . CKD (chronic kidney disease), stage III   . 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 Depression    "son died in service in Aug 30, 1990; still bothers me" (05/16/2013)  . DVT (deep venous thrombosis) (HCC) 10/2011   RUE; pt denies this hx on 05/16/2013  . Dysrhythmia   . Frequent falls   . GERD (gastroesophageal reflux disease)   . H/O hiatal hernia   . Hyperlipidemia   . Hypertension   . Hypoglycemia   . Lumbar spinal stenosis   . Mononeuritis of unspecified site   . Myocardial infarction (HCC)   . Pneumonia 1960   , also 2014  . Quadriparesis (HCC) Aug 30, 2011   pre op  . Shortness of breath dyspnea    with exertion  . Type II diabetes mellitus (HCC)    type 2  . Venous insufficiency    Past Surgical History:  Procedure Laterality Date  . ANTERIOR CERVICAL DECOMP/DISCECTOMY FUSION  10/31/2011   Procedure: ANTERIOR CERVICAL DECOMPRESSION/DISCECTOMY FUSION 2 LEVELS;  Surgeon: Cristi Loron, MD;  Location: [facility]UR[facility]S;  Service: Neurosurgery;  Laterality: N/A;  Cervical Four-Five Cervical Five-Six Anterior Cervical Decompression with fusion interbody prothesis plating and bonegraft  . ANTERIOR CERVICAL DECOMP/DISCECTOMY FUSION N/A 04/17/2014   Procedure: CERVICAL THREE TO FOUR ANTERIOR CERVICAL DECOMPRESSION/DISCECTOMY FUSION 1 LEVEL/HARDWARE REMOVAL;  Surgeon: Tressie Stalker, MD;  Location: [facility] OR[facility];  Service: Neurosurgery;  Laterality: N/A;  C34 anterior cervical decompression with fusion interbody prosthesis plating and bonegraft with exploration of prev  fusion and possible removal of old hardware  . BI-VENTRICULAR IMPLANTABLE CARDIOVERTER DEFIBRILLATOR N/A 05/16/2013   Procedure: BI-VENTRICULAR IMPLANTABLE CARDIOVERTER DEFIBRILLATOR  (CRT-D);  Surgeon: Marinus Maw, MD;  Location: [facility] CATH LAB;  Service: Cardiovascular;  Laterality: N/A;  . BI-VENTRICULAR IMPLANTABLE CARDIOVERTER DEFIBRILLATOR  (CRT-D) Left 05/16/2013   STJ CRTD implanted by Dr Ladona Ridgel for primary prevention/CHF  . CARDIAC CATHETERIZATION  10/2012  . CARPAL TUNNEL RELEASE Right ~ 2013  . CATARACT EXTRACTION W/ INTRAOCULAR LENS IMPLANT Bilateral   . JOINT REPLACEMENT  2013  . LEFT AND RIGHT HEART CATHETERIZATION WITH CORONARY ANGIOGRAM N/A 11/06/2012   Procedure: LEFT AND RIGHT HEART CATHETERIZATION WITH CORONARY ANGIOGRAM;  Surgeon: Laurey Morale, MD;  Location: [facility] CATH LAB;  Service: Cardiovascular;  Laterality: N/A;  . LUMBAR LAMINECTOMY/DECOMPRESSION MICRODISCECTOMY N/A 02/02/2015   Procedure: LUMBAR LAMINECTOMY/DECOMPRESSION MICRODISCECTOMY 2 LEVELS;  Surgeon: Tressie Stalker, MD;  Location: [facility];  Service: Neurosurgery;  Laterality: N/A;  L34 L45 laminectomy and foraminotomy  . LUMBAR WOUND DEBRIDEMENT N/A 03/04/2015   Procedure: Incision and Drainage of LUMBAR WOUND ;  Surgeon: Tressie Stalker, MD;  Location: [facility]S;  Service: Neurosurgery;  Laterality: N/A;  . 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 toe removal  Left    3rd and 4th toe removed  . TOTAL HIP ARTHROPLASTY Right ~ 2012   Social History:   reports that he has never smoked. He has never used smokeless tobacco. He reports that he does not drink alcohol or use drugs.  Family History  Problem Relation Age of Onset  . Diabetes Brother   . Cancer Father     Lung cancer  . Heart attack Mother     pt thinks she had MI  . Cancer Mother     Medications: Patient's Medications  New Prescriptions   No medications on file  Previous Medications   ACARBOSE (PRECOSE) 50 MG  TABLET    Take 50 mg by mouth 3 (three) times daily with meals. Take 1 tablet 1-3 times a day   CARVEDILOL (COREG) 12.5 MG TABLET    Take 1 tablet (12.5 mg total) by mouth 2 (two) times daily with a meal.   DOCUSATE SODIUM (COLACE) 100 MG CAPSULE    Take 300 mg by mouth 2 (two) times daily.    FLUTICASONE (FLONASE) 50 MCG/ACT NASAL SPRAY    Place 2 sprays into the nose daily as needed for allergies.    FUROSEMIDE (LASIX) 40 MG TABLET    Take 1 tablet (40 mg total) by mouth daily.   GABAPENTIN (NEURONTIN) 300 MG CAPSULE    Take 300 mg by mouth 3 (three) times daily.   GLIMEPIRIDE (AMARYL) 2 MG TABLET    Take 2 mg by mouth 2 (two) times daily.   HYDRALAZINE (APRESOLINE) 25 MG TABLET    TAKE ONE-HALF TABLET BY MOUTH THREE TIMES DAILY   HYDROCODONE-ACETAMINOPHEN (NORCO/VICODIN) 5-325 MG TABLET    Take 2 tablets by mouth every 8 (eight) hours as needed for moderate pain.   ISOSORBIDE MONONITRATE (IMDUR)  30 MG 24 HR TABLET    Take 1 tablet (30 mg total) by mouth daily.   IVABRADINE (CORLANOR) 5 MG TABS TABLET    Take 1 tablet (5 mg total) by mouth 2 (two) times daily with a meal.   MAGNESIUM HYDROXIDE (MILK OF MAGNESIA) 400 MG/5ML SUSPENSION    Take 30 mL by mouth daily as needed for constipation.   METFORMIN (GLUCOPHAGE) 500 MG TABLET    Take 500 mg by mouth 2 (two) times daily with a meal.    RIVAROXABAN (XARELTO) 15 MG TABS TABLET    Take 1 tablet (15 mg total) by mouth daily with supper.   ROSUVASTATIN (CRESTOR) 10 MG TABLET    Take 1 tablet (10 mg total) by mouth daily at 6 PM.   SAW PALMETTO 500 MG CAPSULE    Take 1,500 mg by mouth 3 (three) times daily.    SKIN PROTECTANTS, MISC. (EUCERIN) CREAM    Apply 1 application topically 2 (two) times daily. Apply to shin   SODIUM CHLORIDE (DEEP SEA NASAL SPRAY) 0.65 % NASAL SPRAY    Place 2 sprays into the nose every 2 (two) hours as needed for congestion.  Modified Medications   No medications on file  Discontinued Medications   DOXYCYCLINE  (VIBRA-TABS) 100 MG TABLET    Take 1 tablet (100 mg total) by mouth every 12 (twelve) hours.     Physical Exam: Vitals:   12/11/15 1411  BP: 125/62  Pulse: 60  Resp: 18  Temp: 97.3 F (36.3 C)  TempSrc: Oral  Weight: 181 lb (82.1 kg)  Height: 5\' 10"  (1.778 m)    Physical Exam  Constitutional: He appears well-developed and well-nourished. No distress.  HENT:  Head: Normocephalic and atraumatic.  Eyes: Conjunctivae and EOM are normal. Pupils are equal, round, and reactive to light.  Neck: Normal range of motion. Neck supple.  Cardiovascular: Normal rate, regular rhythm and normal heart sounds.   Pulmonary/Chest: Effort normal and breath sounds normal.  Abdominal: Soft. Bowel sounds are normal.  Musculoskeletal: He exhibits no edema.  Neurological: He is alert.  Skin: Skin is warm and dry. He is not diaphoretic.  Psychiatric: He has a normal mood and affect.    Labs reviewed: Basic Metabolic Panel:  Recent Labs  95/62/13 0343 11/27/15 0606 11/28/15 0344 12/03/15  NA 138 138 139 141  K 4.0 4.2 3.9 4.2  CL 103 106 105  --   CO2 27 24 25   --   GLUCOSE 121* 136* 123*  --   BUN 27* 29* 30* 36*  CREATININE 1.41* 1.44* 1.41* 1.5*  CALCIUM 9.7 9.2 9.7  --    Liver Function Tests:  Recent Labs  03/03/15 1933 11/24/15 1719 11/25/15 0343  AST 17 19 17   ALT 11* 8* 10*  ALKPHOS 54 58 56  BILITOT 0.2* 0.4 0.7  PROT 7.7 7.4 7.1  ALBUMIN 3.3* 4.0 3.8   No results for input(s): LIPASE, AMYLASE in the last 8760 hours. No results for input(s): AMMONIA in the last 8760 hours. CBC:  Recent Labs  03/03/15 1933 03/12/15 2151 11/24/15 1719 11/25/15 0327 12/03/15 12/11/15  WBC 11.2* 10.4 8.0 6.5 6.8 7.0  NEUTROABS 7.7  --  5.1  --   --   --   HGB 10.4* 9.9* 11.9* 11.2* 10.2* 10.5*  HCT 32.6* 30.6* 37.5* 35.2* 32* 32*  MCV 93.4 92.7 94.2 93.6  --   --   PLT 323 323 270 223 219 214  TSH:  Recent Labs  12/16/14 1645  TSH 2.928   A1C: Lab Results  Component  Value Date   HGBA1C 5.6 11/25/2015   Lipid Panel:  Recent Labs  12/16/14 1645 10/13/15 1615 11/25/15 0328  CHOL 123 131 124  HDL 28* 24* 18*  LDLCALC 70 84 82  TRIG 126 114 118  CHOLHDL 4.4 5.5 6.9      Assessment/Plan 1. Type 2 diabetes mellitus with stage 2 chronic kidney disease, without long-term current use of insulin (HCC) After review of chart it appears pt was to be off diabetic medication but is still taking. Glucose was 72 on last lab. Will dc these medications and check blood sugars BID   2. Normocytic anemia No signs of bleeding, hgb stable  Charna Neeb K. Biagio BorgEubanks, AGNP  [facility] [phone](Monday-Friday 8 am - 5 pm) [phone] (after hours)

## 2015-12-16 ENCOUNTER — Encounter: Payer: Self-pay | Admitting: Nurse Practitioner

## 2015-12-16 ENCOUNTER — Non-Acute Institutional Stay (SKILLED_NURSING_FACILITY): Payer: Medicare Other | Admitting: Nurse Practitioner

## 2015-12-16 DIAGNOSIS — I6990 Unspecified sequelae of unspecified cerebrovascular disease: Secondary | ICD-10-CM

## 2015-12-16 DIAGNOSIS — D649 Anemia, unspecified: Secondary | ICD-10-CM

## 2015-12-16 DIAGNOSIS — R413 Other amnesia: Secondary | ICD-10-CM

## 2015-12-16 DIAGNOSIS — I4892 Unspecified atrial flutter: Secondary | ICD-10-CM

## 2015-12-16 DIAGNOSIS — L03032 Cellulitis of left toe: Secondary | ICD-10-CM | POA: Diagnosis not present

## 2015-12-16 DIAGNOSIS — I5022 Chronic systolic (congestive) heart failure: Secondary | ICD-10-CM | POA: Diagnosis not present

## 2015-12-16 DIAGNOSIS — E785 Hyperlipidemia, unspecified: Secondary | ICD-10-CM

## 2015-12-16 DIAGNOSIS — E1122 Type 2 diabetes mellitus with diabetic chronic kidney disease: Secondary | ICD-10-CM

## 2015-12-16 DIAGNOSIS — N182 Chronic kidney disease, stage 2 (mild): Secondary | ICD-10-CM | POA: Diagnosis not present

## 2015-12-16 DIAGNOSIS — J309 Allergic rhinitis, unspecified: Secondary | ICD-10-CM | POA: Diagnosis not present

## 2015-12-16 MED ORDER — CARVEDILOL 12.5 MG PO TABS: 12.5000 mg | ORAL_TABLET | Freq: Two times a day (BID) | ORAL | 0 refills | Status: DC

## 2015-12-16 MED ORDER — FLUTICASONE PROPIONATE 50 MCG/ACT NA SUSP: 2.0000 | Freq: Every day | NASAL | 0 refills | Status: DC | PRN

## 2015-12-16 MED ORDER — ISOSORBIDE MONONITRATE ER 30 MG PO TB24: 30.0000 mg | ORAL_TABLET | Freq: Every day | ORAL | 0 refills | Status: DC

## 2015-12-16 MED ORDER — GABAPENTIN 300 MG PO CAPS: 300.0000 mg | ORAL_CAPSULE | Freq: Three times a day (TID) | ORAL | 0 refills | Status: DC

## 2015-12-16 MED ORDER — RIVAROXABAN 15 MG PO TABS: 15.0000 mg | ORAL_TABLET | Freq: Every day | ORAL | 0 refills | Status: DC

## 2015-12-16 MED ORDER — SALINE NASAL SPRAY 0.65 % NA SOLN: 2.0000 | NASAL | 0 refills | Status: AC | PRN

## 2015-12-16 MED ORDER — ACARBOSE 50 MG PO TABS: 50.0000 mg | ORAL_TABLET | Freq: Three times a day (TID) | ORAL | 0 refills | Status: DC

## 2015-12-16 MED ORDER — IVABRADINE HCL 5 MG PO TABS: 5.0000 mg | ORAL_TABLET | Freq: Two times a day (BID) | ORAL | 0 refills | Status: DC

## 2015-12-16 MED ORDER — MAGNESIUM HYDROXIDE 400 MG/5ML PO SUSP: 30.0000 mL | Freq: Every day | ORAL | 0 refills | Status: DC | PRN

## 2015-12-16 MED ORDER — ROSUVASTATIN CALCIUM 10 MG PO TABS: 10.0000 mg | ORAL_TABLET | Freq: Every day | ORAL | 0 refills | Status: DC

## 2015-12-16 MED ORDER — FUROSEMIDE 40 MG PO TABS: 40.0000 mg | ORAL_TABLET | Freq: Every day | ORAL | 0 refills | Status: DC

## 2015-12-16 MED ORDER — HYDRALAZINE HCL 25 MG PO TABS: ORAL_TABLET | ORAL | 0 refills | Status: DC

## 2015-12-16 NOTE — Progress Notes (Signed)
Nursing Home Location:   [facility]   Place of Service: SNF (31)  PCP: [name], MD  Allergies  Allergen Reactions  . Acyclovir And Related Other (See Comments)    unknown  . Dristan Cold [Chlorphen-Pe-Acetaminophen] Anxiety    Makes pt feel "wired up"  . Prednisone Other (See Comments)    Messes with blood sugar levels     Chief Complaint  Patient presents with  . Discharge Note    HPI:  Patient is a 75 y.o. male seen today at [facility] for discharge home. Pt with CAD, chronic systolic heart failure status post AICD placement, chronic kidney disease stage 3-4, diabetes mellitus type 2 who was found to have stroke with possible mass effect per CT. 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    On work-up Interrogation of his pacemaker revealed episodes of atrial flutter, likely contributing to his stroke.  He also had some intracranial atherosclerosis and stenosis.  He was started on xarelto.  Pt also being followed by Dr Lajoyce Corners due to left foot second toe infection. Has been on doxycycline which course is now complete.  Patient currently doing well with therapy, now stable to discharge home with wife and home health severices.   Review of Systems:  Review of Systems  Constitutional: Negative for activity change, appetite change, chills, fatigue, fever and unexpected weight change.  HENT: Negative for hearing loss and tinnitus.   Eyes: Negative.   Respiratory: Negative for cough and shortness of breath.   Cardiovascular: Negative for chest pain, palpitations and leg swelling.  Gastrointestinal: Negative for abdominal pain, constipation and diarrhea.  Genitourinary: Negative for dysuria.  Musculoskeletal: Negative for arthralgias, back pain and myalgias.  Skin: Negative.  Negative for color change and wound.  Neurological: Positive for  speech difficulty (trouble finding words). Negative for dizziness, weakness (improving) and headaches.  Psychiatric/Behavioral: Positive for agitation and confusion. Negative for behavioral problems.    Past Medical History:  Diagnosis Date  . Acute encephalopathy   . AICD (automatic cardioverter/defibrillator) present   . Anemia   . Arthritis    "all over" (05/16/2013)  . Atrial flutter (HCC)   . CAD (coronary artery disease)   . Cardiomyopathy (HCC)    a. 10/2012 Echo: EF 20-25%, diff HK, mod dil LA/RV/RA.  Marland Kitchen Cervical myelopathy (HCC)   . Cervical spinal stenosis   . Chronic systolic congestive heart failure (HCC)   . CKD (chronic kidney disease), stage III   . 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 Depression    "son died in service in 09-10-1990; still bothers me" (05/16/2013)  . DVT (deep venous thrombosis) (HCC) 10/2011   RUE; pt denies this hx on 05/16/2013  . Dysrhythmia   . Frequent falls   . GERD (gastroesophageal reflux disease)   . H/O hiatal hernia   . Hyperlipidemia   . Hypertension   . Hypoglycemia   . Lumbar spinal stenosis   . Mononeuritis of unspecified site   . Myocardial infarction (HCC)   . Pneumonia 1960   , also 2014  . Quadriparesis (HCC) 09-10-2011   pre op  . Shortness of breath dyspnea    with exertion  . Type II diabetes mellitus (HCC)    type 2  . Venous insufficiency    Past Surgical History:  Procedure Laterality Date  . ANTERIOR CERVICAL DECOMP/DISCECTOMY FUSION  10/31/2011   Procedure: ANTERIOR CERVICAL DECOMPRESSION/DISCECTOMY FUSION 2 LEVELS;  Surgeon: Cristi Loron, MD;  Location: [facility];  Service: Neurosurgery;  Laterality: N/A;  Cervical Four-Five Cervical Five-Six Anterior Cervical Decompression with fusion interbody prothesis plating and bonegraft  . ANTERIOR CERVICAL DECOMP/DISCECTOMY FUSION N/A 04/17/2014   Procedure: CERVICAL THREE TO FOUR ANTERIOR  CERVICAL DECOMPRESSION/DISCECTOMY FUSION 1 LEVEL/HARDWARE REMOVAL;  Surgeon: Tressie Stalker, MD;  Location: [facility];  Service: Neurosurgery;  Laterality: N/A;  C34 anterior cervical decompression with fusion interbody prosthesis plating and bonegraft with exploration of prev fusion and possible removal of old hardware  . BI-VENTRICULAR IMPLANTABLE CARDIOVERTER DEFIBRILLATOR N/A 05/16/2013   Procedure: BI-VENTRICULAR IMPLANTABLE CARDIOVERTER DEFIBRILLATOR  (CRT-D);  Surgeon: Marinus Maw, MD;  Location: [facility] CATH LAB;  Service: Cardiovascular;  Laterality: N/A;  . BI-VENTRICULAR IMPLANTABLE CARDIOVERTER DEFIBRILLATOR  (CRT-D) Left 05/16/2013   STJ CRTD implanted by Dr Ladona Ridgel for primary prevention/CHF  . CARDIAC CATHETERIZATION  10/2012  . CARPAL TUNNEL RELEASE Right ~ 2013  . CATARACT EXTRACTION W/ INTRAOCULAR LENS IMPLANT Bilateral   . JOINT REPLACEMENT  2013  . LEFT AND RIGHT HEART CATHETERIZATION WITH CORONARY ANGIOGRAM N/A 11/06/2012   Procedure: LEFT AND RIGHT HEART CATHETERIZATION WITH CORONARY ANGIOGRAM;  Surgeon: Laurey Morale, MD;  Location[facility] C[facility] LAB;  Service: Cardiovascular;  Laterality: N/A;  . LUMBAR LAMINECTOMY/DECOMPRESSION MICRODISCECTOMY N/A 02/02/2015   Procedure: LUMBAR LAMINECTOMY/DECOMPRESSION MICRODISCECTOMY 2 LEVELS;  Surgeon: Tressie Stalker, MD;  Location: [facility];  Service: Neurosurgery;  Laterality: N/A;  L34 L45 laminectomy and foraminotomy  . LUMBAR WOUND DEBRIDEMENT N/A 03/04/2015   Procedure: Incision and Drainage of LUMBAR WOUND ;  Surgeon: Tressie Stalker, MD;  Location: [facility];  Service: Neurosurgery;  Laterality: N/A;  . 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 toe removal  Left    3rd and 4th toe removed  . TOTAL HIP ARTHROPLASTY Right ~ 2012   Social History:   reports that he has never smoked. He has never used smokeless tobacco. He reports that he does not drink alcohol or use drugs.  Family History  Problem Relation  Age of Onset  . Diabetes Brother   . Cancer Father     Lung cancer  . Heart attack Mother     pt thinks she had MI  . Cancer Mother     Medications: Patient's Medications  New Prescriptions   No medications on file  Previous Medications   ACARBOSE (PRECOSE) 50 MG TABLET    Take 50 mg by mouth 3 (three) times daily with meals.    CARVEDILOL (COREG) 12.5 MG TABLET    Take 1 tablet (12.5 mg total) by mouth 2 (two) times daily with a meal.   DOCUSATE SODIUM (COLACE) 100 MG CAPSULE    Take 300 mg by mouth 2 (two) times daily.    FLUTICASONE (FLONASE) 50 MCG/ACT NASAL SPRAY    Place 2 sprays into the nose daily as needed for allergies.    FUROSEMIDE (LASIX) 40 MG TABLET    Take 1 tablet (40 mg total) by mouth daily.   GABAPENTIN (NEURONTIN) 300 MG CAPSULE    Take 300 mg by mouth 3 (three) times daily.   HYDRALAZINE (APRESOLINE) 25 MG TABLET    TAKE ONE-HALF TABLET BY MOUTH THREE TIMES DAILY   HYDROCODONE-ACETAMINOPHEN (NORCO/VICODIN) 5-325 MG TABLET    Take 2 tablets by mouth every 8 (eight) hours  as needed for moderate pain.   ISOSORBIDE MONONITRATE (IMDUR) 30 MG 24 HR TABLET    Take 1 tablet (30 mg total) by mouth daily.   IVABRADINE (CORLANOR) 5 MG TABS TABLET    Take 1 tablet (5 mg total) by mouth 2 (two) times daily with a meal.   MAGNESIUM HYDROXIDE (MILK OF MAGNESIA) 400 MG/5ML SUSPENSION    Take 30 mL by mouth daily as needed for constipation.   RIVAROXABAN (XARELTO) 15 MG TABS TABLET    Take 1 tablet (15 mg total) by mouth daily with supper.   ROSUVASTATIN (CRESTOR) 10 MG TABLET    Take 1 tablet (10 mg total) by mouth daily at 6 PM.   SAW PALMETTO 500 MG CAPSULE    Take 1,500 mg by mouth 3 (three) times daily.    SKIN PROTECTANTS, MISC. (EUCERIN) CREAM    Apply 1 application topically 2 (two) times daily. Apply to shin   SODIUM CHLORIDE (DEEP SEA NASAL SPRAY) 0.65 % NASAL SPRAY    Place 2 sprays into the nose every 2 (two) hours as needed for congestion.  Modified Medications   No  medications on file  Discontinued Medications   GLIMEPIRIDE (AMARYL) 2 MG TABLET    Take 2 mg by mouth 2 (two) times daily.   METFORMIN (GLUCOPHAGE) 500 MG TABLET    Take 500 mg by mouth 2 (two) times daily with a meal.      Physical Exam: Vitals:   12/16/15 1009  BP: 117/69  Pulse: 69  Resp: 19  Temp: (!) 96.8 F (36 C)  Weight: 181 lb (82.1 kg)  Height: 5\' 10"  (1.778 m)    Physical Exam  Constitutional: He appears well-developed and well-nourished. No distress.  HENT:  Head: Normocephalic and atraumatic.  Poor dentition   Eyes: Conjunctivae and EOM are normal. Pupils are equal, round, and reactive to light.  Neck: Normal range of motion. Neck supple.  Cardiovascular: Normal rate, regular rhythm and normal heart sounds.   Pulmonary/Chest: Effort normal and breath sounds normal.  Abdominal: Soft. Bowel sounds are normal.  Musculoskeletal: He exhibits no edema.  Neurological: He is alert.  Skin: Skin is warm and dry. He is not diaphoretic.  Psychiatric: He has a normal mood and affect.    Labs reviewed: Basic Metabolic Panel:  Recent Labs  16/10/96 0343 11/27/15 0606 11/28/15 0344 12/03/15  NA 138 138 139 141  K 4.0 4.2 3.9 4.2  CL 103 106 105  --   CO2 27 24 25   --   GLUCOSE 121* 136* 123*  --   BUN 27* 29* 30* 36*  CREATININE 1.41* 1.44* 1.41* 1.5*  CALCIUM 9.7 9.2 9.7  --    Liver Function Tests:  Recent Labs  03/03/15 1933 11/24/15 1719 11/25/15 0343  AST 17 19 17   ALT 11* 8* 10*  ALKPHOS 54 58 56  BILITOT 0.2* 0.4 0.7  PROT 7.7 7.4 7.1  ALBUMIN 3.3* 4.0 3.8   No results for input(s): LIPASE, AMYLASE in the last 8760 hours. No results for input(s): AMMONIA in the last 8760 hours. CBC:  Recent Labs  03/03/15 1933 03/12/15 2151  11/24/15 1719 11/25/15 0327 12/03/15 12/11/15  WBC 11.2* 10.4  < > 8.0 6.5 6.8 7.0  NEUTROABS 7.7  --   --  5.1  --   --   --   HGB 10.4* 9.9*  < > 11.9* 11.2* 10.2* 10.5*  HCT 32.6* 30.6*  < > 37.5* 35.2* 32*  32*  MCV 93.4 92.7  --  94.2 93.6  --   --   PLT 323 323  < > 270 223 219 214  < > = values in this interval not displayed. TSH:  Recent Labs  12/16/14 1645  TSH 2.928   A1C: Lab Results  Component Value Date   HGBA1C 5.6 11/25/2015   Lipid Panel:  Recent Labs  12/16/14 1645 10/13/15 1615 11/25/15 0328  CHOL 123 131 124  HDL 28* 24* 18*  LDLCALC 70 84 82  TRIG 126 114 118  CHOLHDL 4.4 5.5 6.9      Assessment/Plan 1. Chronic systolic heart failure (HCC) -euvolemic and stable  - carvedilol (COREG) 12.5 MG tablet; Take 1 tablet (12.5 mg total) by mouth 2 (two) times daily with a meal.  Dispense: 60 tablet; Refill: 0 - furosemide (LASIX) 40 MG tablet; Take 1 tablet (40 mg total) by mouth daily.  Dispense: 30 tablet; Refill: 0 - hydrALAZINE (APRESOLINE) 25 MG tablet; TAKE ONE-HALF TABLET BY MOUTH THREE TIMES DAILY  Dispense: 45 tablet; Refill: 0 - isosorbide mononitrate (IMDUR) 30 MG 24 hr tablet; Take 1 tablet (30 mg total) by mouth daily.  Dispense: 30 tablet; Refill: 0 - ivabradine (CORLANOR) 5 MG TABS tablet; Take 1 tablet (5 mg total) by mouth 2 (two) times daily with a meal.  Dispense: 60 tablet; Refill: 0  2. Normocytic anemia hgb stable on last lab, PCP for ongoing follow up  3. Type 2 diabetes mellitus with stage 2 chronic kidney disease, without long-term current use of insulin (HCC) -blood sugars stable off metformin and glimepiride  - acarbose (PRECOSE) 50 MG tablet; Take 1 tablet (50 mg total) by mouth 3 (three) times daily with meals.  Dispense: 90 tablet; Refill: 0  4. Memory deficits After CVA, possible benefit from aricept and namenda, will defer to PCP  5. Atrial flutter, unspecified type (HCC) Rate controlled on current medications. conts on xarelto for anticoagulation  - Rivaroxaban (XARELTO) 15 MG TABS tablet; Take 1 tablet (15 mg total) by mouth daily with supper.  Dispense: 30 tablet; Refill: 0  6. Late effects of CVA (cerebrovascular  accident) -conts xarelto, doing well with PT/OT  - gabapentin (NEURONTIN) 300 MG capsule; Take 1 capsule (300 mg total) by mouth 3 (three) times daily.  Dispense: 90 capsule; Refill: 0  7. Cellulitis of second toe of left foot Has completed course of doxycycline. Ongoing follow up by Dr Lajoyce Cornersuda  8. Allergic rhinitis, unspecified allergic rhinitis type - fluticasone (FLONASE) 50 MCG/ACT nasal spray; Place 2 sprays into both nostrils daily as needed for allergies.  Dispense: 16 g; Refill: 0 - sodium chloride (DEEP SEA NASAL SPRAY) 0.65 % nasal spray; Place 2 sprays into the nose every 2 (two) hours as needed for congestion.  Dispense: 30 mL; Refill: 0  9. Hyperlipidemia - rosuvastatin (CRESTOR) 10 MG tablet; Take 1 tablet (10 mg total) by mouth daily at 6 PM.  Dispense: 30 tablet; Refill: 0  pt is stable for discharge-will need PT/OT per home health. No DME needed. Rx written.  will need to follow up with PCP within 2 weeks.   Janene HarveyJessica K. Biagio BorgEubanks, AGNP  [facility] [phone](Monday-Friday 8 am - 5 pm) [phone] (after hours)

## 2015-12-16 NOTE — Progress Notes (Signed)
This encounter was created in error - please disregard.

## 2015-12-18 ENCOUNTER — Other Ambulatory Visit (HOSPITAL_COMMUNITY): Payer: Medicare Other

## 2015-12-18 ENCOUNTER — Encounter (HOSPITAL_COMMUNITY): Payer: Medicare Other

## 2015-12-21 DIAGNOSIS — E1122 Type 2 diabetes mellitus with diabetic chronic kidney disease: Secondary | ICD-10-CM | POA: Diagnosis not present

## 2015-12-21 DIAGNOSIS — S8011XA Contusion of right lower leg, initial encounter: Secondary | ICD-10-CM | POA: Diagnosis not present

## 2015-12-21 DIAGNOSIS — S8012XA Contusion of left lower leg, initial encounter: Secondary | ICD-10-CM | POA: Diagnosis not present

## 2015-12-21 DIAGNOSIS — N183 Chronic kidney disease, stage 3 (moderate): Secondary | ICD-10-CM | POA: Diagnosis not present

## 2015-12-22 ENCOUNTER — Encounter: Payer: Self-pay | Admitting: Internal Medicine

## 2015-12-23 ENCOUNTER — Telehealth: Payer: Self-pay | Admitting: *Deleted

## 2015-12-23 ENCOUNTER — Encounter: Payer: Self-pay | Admitting: Internal Medicine

## 2015-12-23 NOTE — Telephone Encounter (Signed)
Patient wife, Vernona Rieger called and wanted [place] to call in Eliquis for patient. I told her that the PCP would need to do that and she stated that they just saw him this past Monday at the [facility]. Dr. Mirna Mires. I told her to call his office and she agreed. I also called [facility] at [facility] and informed them to fax patient's Rx to PCP Dr. Mirna Mires. Pharmacist agreed.

## 2015-12-24 ENCOUNTER — Encounter (HOSPITAL_COMMUNITY): Payer: Self-pay

## 2015-12-24 ENCOUNTER — Emergency Department (HOSPITAL_COMMUNITY)
Admission: EM | Admit: 2015-12-24 | Discharge: 2015-12-25 | Disposition: A | Discharge status: Home / Self Care | Payer: Medicare Other | Attending: Emergency Medicine | Admitting: Emergency Medicine

## 2015-12-24 ENCOUNTER — Emergency Department (HOSPITAL_COMMUNITY): Payer: Medicare Other

## 2015-12-24 DIAGNOSIS — I5022 Chronic systolic (congestive) heart failure: Secondary | ICD-10-CM | POA: Diagnosis not present

## 2015-12-24 DIAGNOSIS — R45851 Suicidal ideations: Secondary | ICD-10-CM | POA: Diagnosis present

## 2015-12-24 DIAGNOSIS — I252 Old myocardial infarction: Secondary | ICD-10-CM | POA: Insufficient documentation

## 2015-12-24 DIAGNOSIS — I2510 Atherosclerotic heart disease of native coronary artery without angina pectoris: Secondary | ICD-10-CM | POA: Diagnosis not present

## 2015-12-24 DIAGNOSIS — R410 Disorientation, unspecified: Secondary | ICD-10-CM | POA: Diagnosis not present

## 2015-12-24 DIAGNOSIS — F4321 Adjustment disorder with depressed mood: Secondary | ICD-10-CM | POA: Diagnosis not present

## 2015-12-24 DIAGNOSIS — Z79899 Other long term (current) drug therapy: Secondary | ICD-10-CM | POA: Insufficient documentation

## 2015-12-24 DIAGNOSIS — I130 Hypertensive heart and chronic kidney disease with heart failure and stage 1 through stage 4 chronic kidney disease, or unspecified chronic kidney disease: Secondary | ICD-10-CM | POA: Diagnosis not present

## 2015-12-24 DIAGNOSIS — Z96698 Presence of other orthopedic joint implants: Secondary | ICD-10-CM | POA: Insufficient documentation

## 2015-12-24 DIAGNOSIS — Z96641 Presence of right artificial hip joint: Secondary | ICD-10-CM | POA: Insufficient documentation

## 2015-12-24 DIAGNOSIS — Z7984 Long term (current) use of oral hypoglycemic drugs: Secondary | ICD-10-CM | POA: Diagnosis not present

## 2015-12-24 DIAGNOSIS — I639 Cerebral infarction, unspecified: Secondary | ICD-10-CM | POA: Diagnosis not present

## 2015-12-24 DIAGNOSIS — E1122 Type 2 diabetes mellitus with diabetic chronic kidney disease: Secondary | ICD-10-CM | POA: Diagnosis not present

## 2015-12-24 DIAGNOSIS — N183 Chronic kidney disease, stage 3 (moderate): Secondary | ICD-10-CM | POA: Diagnosis not present

## 2015-12-24 LAB — BLOOD GAS, VENOUS
Acid-Base Excess: 1.6 mmol/L (ref 0.0–2.0)
BICARBONATE: 25.3 mmol/L (ref 20.0–28.0)
O2 SAT: 88.3 %
PATIENT TEMPERATURE: 98.6
PH VEN: 7.429 (ref 7.250–7.430)
PO2 VEN: 57.5 mmHg — AB (ref 32.0–45.0)
pCO2, Ven: 38.9 mmHg — ABNORMAL LOW (ref 44.0–60.0)

## 2015-12-24 LAB — COMPREHENSIVE METABOLIC PANEL
ALBUMIN: 3.9 g/dL (ref 3.5–5.0)
ALT: 18 U/L (ref 17–63)
AST: 32 U/L (ref 15–41)
Alkaline Phosphatase: 63 U/L (ref 38–126)
Anion gap: 9 (ref 5–15)
BILIRUBIN TOTAL: 1.5 mg/dL — AB (ref 0.3–1.2)
BUN: 24 mg/dL — AB (ref 6–20)
CHLORIDE: 106 mmol/L (ref 101–111)
CO2: 26 mmol/L (ref 22–32)
Calcium: 9.8 mg/dL (ref 8.9–10.3)
Creatinine, Ser: 1.48 mg/dL — ABNORMAL HIGH (ref 0.61–1.24)
GFR calc Af Amer: 52 mL/min — ABNORMAL LOW (ref >60)
GFR calc non Af Amer: 45 mL/min — ABNORMAL LOW (ref >60)
GLUCOSE: 110 mg/dL — AB (ref 65–99)
POTASSIUM: 4.9 mmol/L (ref 3.5–5.1)
Sodium: 141 mmol/L (ref 135–145)
Total Protein: 7.6 g/dL (ref 6.5–8.1)

## 2015-12-24 LAB — URINALYSIS, ROUTINE W REFLEX MICROSCOPIC
BILIRUBIN URINE: NEGATIVE
Glucose, UA: NEGATIVE mg/dL
HGB URINE DIPSTICK: NEGATIVE
Ketones, ur: NEGATIVE mg/dL
Leukocytes, UA: NEGATIVE
Nitrite: NEGATIVE
PH: 7 (ref 5.0–8.0)
Protein, ur: NEGATIVE mg/dL
SPECIFIC GRAVITY, URINE: 1.014 (ref 1.005–1.030)

## 2015-12-24 LAB — CBC WITH DIFFERENTIAL/PLATELET
Basophils Absolute: 0 10*3/uL (ref 0.0–0.1)
Basophils Relative: 1 %
EOS PCT: 4 %
Eosinophils Absolute: 0.3 10*3/uL (ref 0.0–0.7)
HCT: 34.5 % — ABNORMAL LOW (ref 39.0–52.0)
Hemoglobin: 11.3 g/dL — ABNORMAL LOW (ref 13.0–17.0)
LYMPHS ABS: 1.6 10*3/uL (ref 0.7–4.0)
LYMPHS PCT: 24 %
MCH: 30.4 pg (ref 26.0–34.0)
MCHC: 32.8 g/dL (ref 30.0–36.0)
MCV: 92.7 fL (ref 78.0–100.0)
MONO ABS: 0.9 10*3/uL (ref 0.1–1.0)
Monocytes Relative: 14 %
Neutro Abs: 3.8 10*3/uL (ref 1.7–7.7)
Neutrophils Relative %: 57 %
PLATELETS: 164 10*3/uL (ref 150–400)
RBC: 3.72 MIL/uL — ABNORMAL LOW (ref 4.22–5.81)
RDW: 14.3 % (ref 11.5–15.5)
WBC: 6.6 10*3/uL (ref 4.0–10.5)

## 2015-12-24 LAB — I-STAT CHEM 8, ED
BUN: 33 mg/dL — AB (ref 6–20)
Calcium, Ion: 1.19 mmol/L (ref 1.15–1.40)
Chloride: 104 mmol/L (ref 101–111)
Creatinine, Ser: 1.4 mg/dL — ABNORMAL HIGH (ref 0.61–1.24)
Glucose, Bld: 100 mg/dL — ABNORMAL HIGH (ref 65–99)
HEMATOCRIT: 36 % — AB (ref 39.0–52.0)
Hemoglobin: 12.2 g/dL — ABNORMAL LOW (ref 13.0–17.0)
POTASSIUM: 4.7 mmol/L (ref 3.5–5.1)
SODIUM: 143 mmol/L (ref 135–145)
TCO2: 28 mmol/L (ref 0–100)

## 2015-12-24 LAB — ETHANOL: Alcohol, Ethyl (B): <5 mg/dL (ref <5)

## 2015-12-24 LAB — RAPID URINE DRUG SCREEN, HOSP PERFORMED
Amphetamines: NOT DETECTED
BARBITURATES: NOT DETECTED
Benzodiazepines: NOT DETECTED
COCAINE: NOT DETECTED
Opiates: POSITIVE — AB
TETRAHYDROCANNABINOL: NOT DETECTED

## 2015-12-24 LAB — CBG MONITORING, ED: Glucose-Capillary: 92 mg/dL (ref 65–99)

## 2015-12-24 LAB — I-STAT CG4 LACTIC ACID, ED: Lactic Acid, Venous: 1.15 mmol/L (ref 0.5–1.9)

## 2015-12-24 LAB — AMMONIA: Ammonia: 21 umol/L (ref 9–35)

## 2015-12-24 MED ORDER — DOCUSATE SODIUM 100 MG PO CAPS
300.0000 mg | ORAL_CAPSULE | Freq: Two times a day (BID) | ORAL | Status: DC
  Administered 2015-12-24 – 2015-12-25 (×2): 300 mg via ORAL
  Filled 2015-12-24 (×2): qty 3

## 2015-12-24 MED ORDER — FUROSEMIDE 40 MG PO TABS
40.0000 mg | ORAL_TABLET | Freq: Every day | ORAL | Status: DC
  Administered 2015-12-24 – 2015-12-25 (×2): 40 mg via ORAL
  Filled 2015-12-24 (×2): qty 1

## 2015-12-24 MED ORDER — IVABRADINE HCL 5 MG PO TABS
5.0000 mg | ORAL_TABLET | Freq: Two times a day (BID) | ORAL | Status: DC
  Administered 2015-12-25 (×2): 5 mg via ORAL
  Filled 2015-12-24 (×3): qty 1

## 2015-12-24 MED ORDER — CARVEDILOL 12.5 MG PO TABS
12.5000 mg | ORAL_TABLET | Freq: Two times a day (BID) | ORAL | Status: DC
  Administered 2015-12-24 – 2015-12-25 (×3): 12.5 mg via ORAL
  Filled 2015-12-24 (×4): qty 1

## 2015-12-24 MED ORDER — ACARBOSE 50 MG PO TABS
50.0000 mg | ORAL_TABLET | Freq: Three times a day (TID) | ORAL | Status: DC
  Administered 2015-12-25 (×2): 50 mg via ORAL
  Filled 2015-12-24 (×4): qty 1

## 2015-12-24 MED ORDER — GLIMEPIRIDE 2 MG PO TABS
2.0000 mg | ORAL_TABLET | Freq: Two times a day (BID) | ORAL | Status: DC
  Administered 2015-12-25 (×2): 2 mg via ORAL
  Filled 2015-12-24 (×3): qty 1

## 2015-12-24 MED ORDER — HYDRALAZINE HCL 25 MG PO TABS
12.5000 mg | ORAL_TABLET | Freq: Three times a day (TID) | ORAL | Status: DC
  Administered 2015-12-24 – 2015-12-25 (×3): 12.5 mg via ORAL
  Filled 2015-12-24 (×6): qty 0.5

## 2015-12-24 MED ORDER — ISOSORBIDE MONONITRATE ER 30 MG PO TB24
30.0000 mg | ORAL_TABLET | Freq: Every day | ORAL | Status: DC
  Administered 2015-12-25: 30 mg via ORAL
  Filled 2015-12-24 (×3): qty 1

## 2015-12-24 MED ORDER — RIVAROXABAN 15 MG PO TABS
15.0000 mg | ORAL_TABLET | Freq: Every day | ORAL | Status: DC
  Administered 2015-12-24 – 2015-12-25 (×2): 15 mg via ORAL
  Filled 2015-12-24 (×2): qty 1

## 2015-12-24 MED ORDER — ROSUVASTATIN CALCIUM 10 MG PO TABS
10.0000 mg | ORAL_TABLET | Freq: Every day | ORAL | Status: DC
  Administered 2015-12-24 – 2015-12-25 (×2): 10 mg via ORAL
  Filled 2015-12-24 (×2): qty 1

## 2015-12-24 MED ORDER — FLUTICASONE PROPIONATE 50 MCG/ACT NA SUSP: 2.0000 | Freq: Every day | NASAL | Status: DC | PRN

## 2015-12-24 MED ORDER — METFORMIN HCL 500 MG PO TABS
1000.0000 mg | ORAL_TABLET | Freq: Two times a day (BID) | ORAL | Status: DC
  Administered 2015-12-25 (×2): 1000 mg via ORAL
  Filled 2015-12-24 (×2): qty 2

## 2015-12-24 MED ORDER — GABAPENTIN 300 MG PO CAPS
300.0000 mg | ORAL_CAPSULE | Freq: Three times a day (TID) | ORAL | Status: DC
  Administered 2015-12-24 – 2015-12-25 (×3): 300 mg via ORAL
  Filled 2015-12-24 (×3): qty 1

## 2015-12-24 NOTE — ED Provider Notes (Signed)
[facility]-EMERGENCY DEPT Provider Note   CSN: [id number] Arrival date & time: 12/24/15  1546     History   Chief Complaint Chief Complaint  Patient presents with  . Psychiatric Evaluation  . Social Work Issue    HPI [name] is a 75 y.o. male.  75 yo M with a chief complaint of homicidal and suicidal ideation. Patient did not like his current living situation. His wife said since his recent stroke he does not recognize anything around him. He claimed that this place was a "shit hole" and anywhere else will be better to live. His wife states that he is threatened to kill her with a screwdriver at multiple occasions. He is also thrown screwdrivers at her. Also stating that he has had some hallucinations. Paranoia. Denies history of prior mental illness. Denies cough congestion fevers vomiting or diarrhea.   The history is provided by the patient and the spouse.    Past Medical History:  Diagnosis Date  . Acute encephalopathy   . AICD (automatic cardioverter/defibrillator) present   . Anemia   . Arthritis    "all over" (05/16/2013)  . Atrial flutter (HCC)   . CAD (coronary artery disease)   . Cardiomyopathy (HCC)    a. 10/2012 Echo: EF 20-25%, diff HK, mod dil LA/RV/RA.  Marland Kitchen Cervical myelopathy (HCC)   . Cervical spinal stenosis   . Chronic systolic congestive heart failure (HCC)   . CKD (chronic kidney disease), stage III   . 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 Depression    "son died in service in 08/03/90; still bothers me" (05/16/2013)  . DVT (deep venous thrombosis) (HCC) 10/2011   RUE; pt denies this hx on 05/16/2013  . Dysrhythmia   . Frequent falls   . GERD (gastroesophageal reflux disease)   . H/O hiatal hernia   . Hyperlipidemia   . Hypertension   . Hypoglycemia   . Lumbar spinal stenosis   . Mononeuritis of unspecified site   . Myocardial infarction (HCC)   . Pneumonia 1960   , also 2014  . Quadriparesis (HCC) 08-03-11   pre op  . Shortness of breath dyspnea    with exertion  . Type II diabetes mellitus (HCC)    type 2  . Venous insufficiency     Patient Active Problem List   Diagnosis Date Noted  . Memory deficits 12/01/2015  . Encephalopathy, metabolic 11/28/2015  . Cellulitis of second toe of left foot   . Acute left PCA stroke (HCC) 11/25/2015  . Wound dehiscence 03/03/2015  . Lumbar stenosis with neurogenic claudication 02/02/2015  . Cervical spondylosis with myelopathy 04/17/2014  . ICD (implantable cardioverter-defibrillator), biventricular, in situ 11/19/2013  . Cardiac resynchronization therapy defibrillator (CRT-D) in place 07/03/2013  . Chronic renal failure 01/24/2013  . CAD (coronary artery disease) 12/10/2012  . Chronic systolic heart failure (HCC) 11/20/2012  . Cardiomyopathy, ischemic 11/20/2012  . Atrial flutter (HCC) 11/08/2012  . Dilated cardiomyopathy (HCC) 11/01/2012  . Hypoglycemia 10/29/2012  . Community acquired pneumonia 10/29/2012  . Acute encephalopathy 10/29/2012  . Cervical stenosis of spine 10/28/2011  . Spondylosis of cervical joint 10/28/2011  . Cervical myelopathy (HCC) 10/28/2011  . Quadriparesis (HCC) 10/28/2011  . DVT of right proximal brachial through the distal axillary vein, acute 10/06/2011  . Constipation due to pain medication 10/06/2011  . Venous insufficiency 10/05/2011  . Complex regional pain syndrome of right  upper extremity secondary to severe cervical spinal stenosis 10/05/2011  . Hyperlipidemia 10/05/2011  . Normocytic anemia 10/03/2011  . Falls frequently 10/03/2011  . Diabetes mellitus with chronic kidney disease (HCC) 10/03/2011  . HTN (hypertension) 10/03/2011  . Gait abnormality secondary to lumbar spinal stenosis 10/03/2011  . Arthritis 10/03/2011    Past Surgical History:  Procedure Laterality Date  . ANTERIOR CERVICAL DECOMP/DISCECTOMY FUSION  10/31/2011   Procedure: ANTERIOR CERVICAL  DECOMPRESSION/DISCECTOMY FUSION 2 LEVELS;  Surgeon: Cristi Loron, MD;  Location: [facility];  Service: Neurosurgery;  Laterality: N/A;  Cervical Four-Five Cervical Five-Six Anterior Cervical Decompression with fusion interbody prothesis plating and bonegraft  . ANTERIOR CERVICAL DECOMP/DISCECTOMY FUSION N/A 04/17/2014   Procedure: CERVICAL THREE TO FOUR ANTERIOR CERVICAL DECOMPRESSION/DISCECTOMY FUSION 1 LEVEL/HARDWARE REMOVAL;  Surgeon: Tressie Stalker, MD;  Location: [facility];  Service: Neurosurgery;  Laterality: N/A;  C34 anterior cervical decompression with fusion interbody prosthesis plating and bonegraft with exploration of prev fusion and possible removal of old hardware  . BI-VENTRICULAR IMPLANTABLE CARDIOVERTER DEFIBRILLATOR N/A 05/16/2013   Procedure: BI-VENTRICULAR IMPLANTABLE CARDIOVERTER DEFIBRILLATOR  (CRT-D);  Surgeon: Marinus Maw, MD;  Location: [facility] C[facility] LAB;  Service: Cardiovascular;  Laterality: N/A;  . BI-VENTRICULAR IMPLANTABLE CARDIOVERTER DEFIBRILLATOR  (CRT-D) Left 05/16/2013   STJ CRTD implanted by Dr Ladona Ridgel for primary prevention/CHF  . CARDIAC CATHETERIZATION  10/2012  . CARPAL TUNNEL RELEASE Right ~ 2013  . CATARACT EXTRACTION W/ INTRAOCULAR LENS IMPLANT Bilateral   . JOINT REPLACEMENT  2013  . LEFT AND RIGHT HEART CATHETERIZATION WITH CORONARY ANGIOGRAM N/A 11/06/2012   Procedure: LEFT AND RIGHT HEART CATHETERIZATION WITH CORONARY ANGIOGRAM;  Surgeon: Laurey Morale, MD;  Location: [facility] CATH LAB;  Service: Cardiovascular;  Laterality: N/A;  . LUMBAR LAMINECTOMY/DECOMPRESSION MICRODISCECTOMY N/A 02/02/2015   Procedure: LUMBAR LAMINECTOMY/DECOMPRESSION MICRODISCECTOMY 2 LEVELS;  Surgeon: Tressie Stalker, MD;  Location: [facility];  Service: Neurosurgery;  Laterality: N/A;  L34 L45 laminectomy and foraminotomy  . LUMBAR WOUND DEBRIDEMENT N/A 03/04/2015   Procedure: Incision and Drainage of LUMBAR WOUND ;  Surgeon: Tressie Stalker, MD;  Location: [facility];   Service: Neurosurgery;  Laterality: N/A;  . 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 toe removal  Left    3rd and 4th toe removed  . TOTAL HIP ARTHROPLASTY Right ~ 2012       Home Medications    Prior to Admission medications   Medication Sig Start Date End Date Taking? Authorizing Provider  acarbose (PRECOSE) 50 MG tablet Take 1 tablet (50 mg total) by mouth 3 (three) times daily with meals. 12/16/15  Yes Sharon Seller, NP  carvedilol (COREG) 12.5 MG tablet Take 1 tablet (12.5 mg total) by mouth 2 (two) times daily with a meal. 12/16/15  Yes Sharon Seller, NP  docusate sodium (COLACE) 100 MG capsule Take 300 mg by mouth 2 (two) times daily.    Yes Historical Provider, MD  fluticasone (FLONASE) 50 MCG/ACT nasal spray Place 2 sprays into both nostrils daily as needed for allergies. 12/16/15  Yes Sharon Seller, NP  furosemide (LASIX) 40 MG tablet Take 1 tablet (40 mg total) by mouth daily. 12/16/15  Yes Sharon Seller, NP  gabapentin (NEURONTIN) 300 MG capsule Take 1 capsule (300 mg total) by mouth 3 (three) times daily. 12/16/15  Yes Sharon Seller, NP  glimepiride (AMARYL) 2 MG tablet Take 2 mg by mouth 2 (two) times daily.   Yes Historical Provider, MD  hydrALAZINE (APRESOLINE) 25 MG tablet TAKE ONE-HALF TABLET BY MOUTH THREE TIMES DAILY Patient taking differently: Take 12.5 mg by mouth 3 (three) times daily.  12/16/15  Yes Sharon Seller, NP  HYDROcodone-acetaminophen (NORCO/VICODIN) 5-325 MG tablet Take 2 tablets by mouth every 8 (eight) hours as needed for moderate pain. 11/27/15  Yes Renae Fickle, MD  isosorbide mononitrate (IMDUR) 30 MG 24 hr tablet Take 1 tablet (30 mg total) by mouth daily. 12/16/15  Yes Sharon Seller, NP  ivabradine (CORLANOR) 5 MG TABS tablet Take 1 tablet (5 mg total) by mouth 2 (two) times daily with a meal. 12/16/15  Yes Sharon Seller, NP  magnesium hydroxide (MILK OF MAGNESIA) 400 MG/5ML suspension Take 30 mLs by  mouth daily as needed. Patient taking differently: Take 30 mLs by mouth daily as needed for mild constipation or moderate constipation.  12/16/15  Yes Sharon Seller, NP  metFORMIN (GLUCOPHAGE) 1000 MG tablet Take 1,000 mg by mouth 2 (two) times daily with a meal.   Yes Historical Provider, MD  methocarbamol (ROBAXIN) 500 MG tablet Take 500 mg by mouth once.   Yes Historical Provider, MD  Rivaroxaban (XARELTO) 15 MG TABS tablet Take 1 tablet (15 mg total) by mouth daily with supper. 12/16/15  Yes Sharon Seller, NP  rosuvastatin (CRESTOR) 10 MG tablet Take 1 tablet (10 mg total) by mouth daily at 6 PM. 12/16/15  Yes Sharon Seller, NP  saw palmetto 500 MG capsule Take 1,500 mg by mouth 3 (three) times daily.    Yes Historical Provider, MD  Skin Protectants, Misc. (EUCERIN) cream Apply 1 application topically 2 (two) times daily as needed for dry skin. Apply to shin    Yes Historical Provider, MD  sodium chloride (DEEP SEA NASAL SPRAY) 0.65 % nasal spray Place 2 sprays into the nose every 2 (two) hours as needed for congestion. 12/16/15  Yes Sharon Seller, NP    Family History Family History  Problem Relation Age of Onset  . Diabetes Brother   . Cancer Father     Lung cancer  . Heart attack Mother     pt thinks she had MI  . Cancer Mother     Social History Social History  Substance Use Topics  . Smoking status: Never Smoker  . Smokeless tobacco: Never Used  . Alcohol use No     Comment: 05/16/2013 "used to drink a little bit a long time ago; nothing in over 30 yrs"     Allergies   Acyclovir and related; Dristan cold [chlorphen-pe-acetaminophen]; and Prednisone   Review of Systems Review of Systems  Constitutional: Negative for chills and fever.  HENT: Negative for congestion and facial swelling.   Eyes: Negative for discharge and visual disturbance.  Respiratory: Negative for shortness of breath.   Cardiovascular: Negative for chest pain and palpitations.    Gastrointestinal: Negative for abdominal pain, diarrhea and vomiting.  Musculoskeletal: Negative for arthralgias and myalgias.  Skin: Negative for color change and rash.  Neurological: Negative for tremors, syncope and headaches.  Psychiatric/Behavioral: Positive for dysphoric mood, hallucinations and suicidal ideas. Negative for confusion.     Physical Exam Updated Vital Signs BP 156/87 (BP Location: Left Arm)   Pulse 84   Temp 97.4 F (36.3 C) (Oral)   Resp 18   SpO2 100%   Physical Exam  Constitutional: He appears well-developed and well-nourished.  HENT:  Head: Normocephalic and atraumatic.  Eyes: EOM are normal. Pupils are equal, round, and  reactive to light.  Neck: Normal range of motion. Neck supple. No JVD present.  Cardiovascular: Normal rate and regular rhythm.  Exam reveals no gallop and no friction rub.   No murmur heard. Pulmonary/Chest: No respiratory distress. He has no wheezes.  Abdominal: He exhibits no distension. There is no rebound and no guarding.  Musculoskeletal: Normal range of motion.  Ulceration to the plantar aspect of the left second toe. No noted erythema or drainage.  Neurological: He is alert. He is disoriented.  Skin: No rash noted. No pallor.  Psychiatric: He has a normal mood and affect. His behavior is normal.  Nursing note and vitals reviewed.    ED Treatments / Results  Labs (all labs ordered are listed, but only abnormal results are displayed) Labs Reviewed  COMPREHENSIVE METABOLIC PANEL - Abnormal; Notable for the following:       Result Value   Glucose, Bld 110 (*)    BUN 24 (*)    Creatinine, Ser 1.48 (*)    Total Bilirubin 1.5 (*)    GFR calc non Af Amer 45 (*)    GFR calc Af Amer 52 (*)    All other components within normal limits  CBC WITH DIFFERENTIAL/PLATELET - Abnormal; Notable for the following:    RBC 3.72 (*)    Hemoglobin 11.3 (*)    HCT 34.5 (*)    All other components within normal limits  BLOOD GAS, VENOUS -  Abnormal; Notable for the following:    pCO2, Ven 38.9 (*)    pO2, Ven 57.5 (*)    All other components within normal limits  URINE RAPID DRUG SCREEN, HOSP PERFORMED - Abnormal; Notable for the following:    Opiates POSITIVE (*)    All other components within normal limits  I-STAT CHEM 8, ED - Abnormal; Notable for the following:    BUN 33 (*)    Creatinine, Ser 1.40 (*)    Glucose, Bld 100 (*)    Hemoglobin 12.2 (*)    HCT 36.0 (*)    All other components within normal limits  URINE CULTURE  AMMONIA  ETHANOL  URINALYSIS, ROUTINE W REFLEX MICROSCOPIC (NOT AT [facility])  I-STAT CG4 LACTIC ACID, ED  CBG MONITORING, ED    EKG  EKG Interpretation  Date/Time:  Thursday December 24 2015 17:59:21 EDT Ventricular Rate:  74 PR Interval:    QRS Duration: 177 QT Interval:  483 QTC Calculation: 536 R Axis:   -70 Text Interpretation:  Atrial-sensed ventricular-paced rhythm No further analysis attempted due to paced rhythm Baseline wander in lead(s) III V1 No significant change since last tracing Confirmed by Edgar Corrigan MD, DANIEL [phone]) on 12/24/2015 6:22:57 PM       Radiology Dg Chest 2 View  Result Date: 12/24/2015 CLINICAL DATA:  Altered mental status.  Initial encounter. EXAM: CHEST  2 VIEW COMPARISON:  05/17/2013 FINDINGS: There is a left biventricular cardiac ICD. Patient appears to be kyphotic on the AP view. Mild elevation of left hemidiaphragm. No focal airspace disease. Heart size is within normal limits and stable. Atherosclerotic calcifications at the aortic arch. No large pleural effusions. IMPRESSION: Low lung volumes without acute abnormality. Aortic atherosclerosis. Electronically Signed   By: Richarda Overlie M.D.   On: 12/24/2015 17:04   Ct Head Wo Contrast  Result Date: 12/24/2015 CLINICAL DATA:  Psychiatric evaluation. EXAM: CT HEAD WITHOUT CONTRAST TECHNIQUE: Contiguous axial images were obtained from the base of the skull through the vertex without intravenous contrast.  COMPARISON:  11/25/2015  FINDINGS: Brain: Expected evolution of left PCA territory infarct seen previously, with decreased swelling and new patchy cortically based high density that is attributed to cortical necrosis and mineralization. No acute or interval infarct is seen. No acute hemorrhage, hydrocephalus, or shift. Remote small vessel infarct in the upper right cerebellum. Vascular: No hyperdense vessel. Extensive atherosclerotic calcification. Skull: Negative for fracture or focal lesion. Sinuses/Orbits: Bilateral cataract resection. Other: None. IMPRESSION: 1. No acute finding. 2. Expected evolution of recent left PCA territory infarct. Electronically Signed   By: Marnee SpringJonathon  Watts M.D.   On: 12/24/2015 17:09    Procedures Procedures (including critical care time)  Medications Ordered in ED Medications  acarbose (PRECOSE) tablet 50 mg (not administered)  carvedilol (COREG) tablet 12.5 mg (12.5 mg Oral Given 12/24/15 2115)  docusate sodium (COLACE) capsule 300 mg (300 mg Oral Given 12/24/15 2114)  fluticasone (FLONASE) 50 MCG/ACT nasal spray 2 spray (not administered)  furosemide (LASIX) tablet 40 mg (40 mg Oral Given 12/24/15 2016)  gabapentin (NEURONTIN) capsule 300 mg (300 mg Oral Given 12/24/15 2114)  glimepiride (AMARYL) tablet 2 mg (not administered)  hydrALAZINE (APRESOLINE) tablet 12.5 mg (12.5 mg Oral Given 12/24/15 2115)  isosorbide mononitrate (IMDUR) 24 hr tablet 30 mg (not administered)  ivabradine (CORLANOR) tablet 5 mg (not administered)  metFORMIN (GLUCOPHAGE) tablet 1,000 mg (not administered)  Rivaroxaban (XARELTO) tablet 15 mg (15 mg Oral Given 12/24/15 2114)  rosuvastatin (CRESTOR) tablet 10 mg (10 mg Oral Given 12/24/15 2114)     Initial Impression / Assessment and Plan / ED Course  I have reviewed the triage vital signs and the nursing notes.  Pertinent labs & imaging results that were available during my care of the patient were reviewed by me and considered in my medical  decision making (see chart for details).  Clinical Course    75 yo M With a chief complaints of homicidal, suicidal ideation. This may be related to his recent stroke. Regardless patient has no history of prior mental illness will obtain an altered mental status workup.  Workup was unremarkable. Feel the patient is medically cleared at this time, I suspect likely this changes due to his stroke. We'll have psychiatry evaluate.  The patients results and plan were reviewed and discussed.   Any x-rays performed were independently reviewed by myself.   Differential diagnosis were considered with the presenting HPI.  Medications  acarbose (PRECOSE) tablet 50 mg (not administered)  carvedilol (COREG) tablet 12.5 mg (12.5 mg Oral Given 12/24/15 2115)  docusate sodium (COLACE) capsule 300 mg (300 mg Oral Given 12/24/15 2114)  fluticasone (FLONASE) 50 MCG/ACT nasal spray 2 spray (not administered)  furosemide (LASIX) tablet 40 mg (40 mg Oral Given 12/24/15 2016)  gabapentin (NEURONTIN) capsule 300 mg (300 mg Oral Given 12/24/15 2114)  glimepiride (AMARYL) tablet 2 mg (not administered)  hydrALAZINE (APRESOLINE) tablet 12.5 mg (12.5 mg Oral Given 12/24/15 2115)  isosorbide mononitrate (IMDUR) 24 hr tablet 30 mg (not administered)  ivabradine (CORLANOR) tablet 5 mg (not administered)  metFORMIN (GLUCOPHAGE) tablet 1,000 mg (not administered)  Rivaroxaban (XARELTO) tablet 15 mg (15 mg Oral Given 12/24/15 2114)  rosuvastatin (CRESTOR) tablet 10 mg (10 mg Oral Given 12/24/15 2114)    Vitals:   12/24/15 1920 12/24/15 2115 12/24/15 2119 12/24/15 2204  BP: 171/76 161/96 161/96 156/87  Pulse: 65 85 85 84  Resp: 16  18 18   Temp:    97.4 F (36.3 C)  TempSrc:    Oral  SpO2: 100%  100% 100%  Final diagnoses:  Disorientation       Final Clinical Impressions(s) / ED Diagnoses   Final diagnoses:  Disorientation    New Prescriptions New Prescriptions   No medications on file     Melene Plan, DO 12/25/15 0040

## 2015-12-24 NOTE — ED Triage Notes (Addendum)
Per EMS, Pt, from home, presents for evaluation.  EMS reports they were called by [employer] because the Pt was sitting outside stating that "that is not house.  No one should live there.  I don't want to be in there."  EMS reported that the Pt and wife are hoarders.    Pt's wife reported to EMS that the Pt has been threatening to kill her x 5 days.  Sts "he has been throwing screwdrivers and other objects."  She reported to ED staff that the Pt has been threatening to kill himself and her "unless they get out of the house."  Pt sts that he made threats, because he was mad.  Sts "I would never hurt her."     Pt is asking for placement for himself and his wife.  Sts "that is no place to live.  We need to get out of there."  Pt was recently discharged from [facility] x 1 week ago.

## 2015-12-24 NOTE — ED Notes (Signed)
This RN attempted to do a Stroke screen. Pt refused to participate. He was "angry we took all of his blood". Pt was able to answer his name and birth date and location, but was unable to report why he is here or the year. Pt was also able to correctly answer critical thinking questions

## 2015-12-24 NOTE — Progress Notes (Signed)
Patient recently admitted and discharged from hospital  from 08/22 to 08/26 for Acute left PCAstroke resulting in dysarthria, expressive aphasia and metabolic encephalopathy/confusion. Patient was then discharged from hospital to [facility] rehab facility.  Discharged from facility on 09/13 with home health services for PT and OT, no dme was arranged per nursing facility discharge summary.  Name of home health agency was on discharge summary. Patient presents to Ed this evening for threatening to kill his wife with a screwdriver and throwing screwdrivers at her on occasion over the past five days.   Chart review reveals patient and his wife are hoarders.  May want to find out what home health agency was assigned to patient and if they are able to see him due to said condition of patient's home. SW consult placed for possible APS report. Discussed patient with EDP.  Questions whether patient is having residual from recent stroke or psychosis.  EDCM suggested speaking with Neurology. TTS consult placed. No further EDCM needs at this time.

## 2015-12-24 NOTE — ED Notes (Signed)
Per TTS patient does not meet IP criteria-will need AM psych eval.

## 2015-12-24 NOTE — ED Notes (Signed)
Patient and wife given [place] sandwiches, drinks, crackers, applesauce.  Per CN okay for patient's spouse to stay the night as she does not have a ride home.  Patient placed in burgundy scrubs.  Patient, spouse and belongings wanded by security.

## 2015-12-24 NOTE — ED Notes (Signed)
Bed: WTR8 Expected date:  Expected time:  Means of arrival:  Comments: 

## 2015-12-24 NOTE — BH Assessment (Addendum)
Tele Assessment Note   [name] is an 75 y.o. male who presents to the ED after telling his wife that he wanted to kill her and kill himself if they did not get their home cleaned. While speaking with the pt, he denied saying this and said that he does not know why he is in the ED and stated "me and my wife fussed about 40 years ago and haven't had an argument since." The pt denies A/V hallucinations, H/I and reports that he is "sad sometimes but hell I think we all get depressed sometimes." During the assessment, pt was laughing and smiling with the assessor. When the pt was asked if he ever attempted suicide in the past or engaged in self-injurious behaviors, pt denied. Pt was not oriented as when he was asked to verify his DOB the pt responded with "oh, today is my birthday?" Pt was asked to verify where he lives and he stated "right here I reckon."   Pt reports to issues with sleeping stating that he does not sleep that well and states the only thing he has eaten in the past 2 days is the sandwich he received at the hospital. 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 Pt denies prior OPT or inpt therapy and states he sometimes "feels lonely" because people who used to ask him to do things for him no longer come by. Pt states "I don't have any kids or grandkids, I can't go anywhere."   Per Nira Conn, FNP pt does not meet inpt criteria. Pt will need an AM psych eval. Delfin Edis Isom, RN has been notified.   Diagnosis: Deferred  Past Medical History:  Past Medical History:  Diagnosis Date   Acute encephalopathy    AICD (automatic cardioverter/defibrillator) present    Anemia    Arthritis    "all over" (05/16/2013)   Atrial flutter (HCC)    CAD (coronary artery disease)    Cardiomyopathy (HCC)    a. 10/2012 Echo: EF 20-25%, diff  HK, mod dil LA/RV/RA.   Cervical myelopathy (HCC)    Cervical spinal stenosis    Chronic systolic congestive heart failure (HCC)    CKD (chronic kidney disease), stage III    Complex regional pain syndrome of right lower extremity    Complex regional pain syndrome of right upper extremity    Depression    "son died in service in 09/04/90; still bothers me" (05/16/2013)   DVT (deep venous thrombosis) (HCC) 10/2011   RUE; pt denies this hx on 05/16/2013   Dysrhythmia    Frequent falls    GERD (gastroesophageal reflux disease)    H/O hiatal hernia    Hyperlipidemia    Hypertension    Hypoglycemia    Lumbar spinal stenosis    Mononeuritis of unspecified site    Myocardial infarction (HCC)    Pneumonia 1960   , also 2014   Quadriparesis (HCC) 2011/09/04   pre op   Shortness of breath dyspnea    with exertion   Type II diabetes mellitus (HCC)    type 2   Venous insufficiency     Past Surgical History:  Procedure Laterality Date   ANTERIOR CERVICAL DECOMP/DISCECTOMY FUSION  10/31/2011   Procedure: ANTERIOR CERVICAL DECOMPRESSION/DISCECTOMY FUSION 2 LEVELS;  Surgeon: Cristi Loron, MD;  Location:  [facility];  Service: Neurosurgery;  Laterality: N/A;  Cervical Four-Five Cervical Five-Six Anterior Cervical Decompression with fusion interbody prothesis plating and bonegraft   ANTERIOR CERVICAL DECOMP/DISCECTOMY FUSION N/A 04/17/2014   Procedure: CERVICAL THREE TO FOUR ANTERIOR CERVICAL DECOMPRESSION/DISCECTOMY FUSION 1 LEVEL/HARDWARE REMOVAL;  Surgeon: Tressie StalkerJeffrey Jenkins, MD;  Location: [facility];  Service: Neurosurgery;  Laterality: N/A;  C34 anterior cervical decompression with fusion interbody prosthesis plating and bonegraft with exploration of prev fusion and possible removal of old hardware   BI-VENTRICULAR IMPLANTABLE CARDIOVERTER DEFIBRILLATOR N/A 05/16/2013   Procedure: BI-VENTRICULAR IMPLANTABLE CARDIOVERTER DEFIBRILLATOR  (CRT-D);  Surgeon: Marinus MawGregg W Taylor, MD;   [facility];  Service: Cardiovascular;  Laterality: N/A;   BI-VENTRICULAR IMPLANTABLE CARDIOVERTER DEFIBRILLATOR  (CRT-D) Left 05/16/2013   STJ CRTD implanted by Dr Ladona Ridgelaylor for primary prevention/CHF   CARDIAC CATHETERIZATION  10/2012   CARPAL TUNNEL RELEASE Right ~ 2013   CATARACT EXTRACTION W/ INTRAOCULAR LENS IMPLANT Bilateral    JOINT REPLACEMENT  2013   LEFT AND RIGHT HEART CATHETERIZATION WITH CORONARY ANGIOGRAM N/A 11/06/2012   Procedure: LEFT AND RIGHT HEART CATHETERIZATION WITH CORONARY ANGIOGRAM;  Surgeon: Laurey Moralealton S McLean, MD;  [facility];  Service: Cardiovascular;  Laterality: N/A;   LUMBAR LAMINECTOMY/DECOMPRESSION MICRODISCECTOMY N/A 02/02/2015   Procedure: LUMBAR LAMINECTOMY/DECOMPRESSION MICRODISCECTOMY 2 LEVELS;  Surgeon: Tressie StalkerJeffrey Jenkins, MD;  Location: [facility];  Service: Neurosurgery;  Laterality: N/A;  L34 L45 laminectomy and foraminotomy   LUMBAR WOUND DEBRIDEMENT N/A 03/04/2015   Procedure: Incision and Drainage of LUMBAR WOUND ;  Surgeon: Tressie StalkerJeffrey Jenkins, MD;  Location: [facility];  Service: Neurosurgery;  Laterality: N/A;   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 He has never used smokeless tobacco. He reports that he does not drink alcohol or use drugs.  Additional Social History:  Alcohol / Drug Use Pain Medications: Pt denies abuse Prescriptions: Pt denies abuse Over the Counter: Pt denies abuse History of alcohol / drug use?: No history of alcohol / drug abuse  CIWA: CIWA-Ar BP: 156/87 Pulse Rate: 84 COWS:    PATIENT STRENGTHS: (choose at least two) Active sense of  humor General fund of knowledge Supportive family/friends  Allergies:  Allergies  Allergen Reactions   Acyclovir And Related Other (See Comments)    unknown   Dristan Cold [Chlorphen-Pe-Acetaminophen] Anxiety    Makes pt feel "wired up"   Prednisone Other (See Comments)    Messes with blood sugar levels     Home Medications:  (Not in a hospital admission)  OB/GYN Status:  No LMP for male patient.  General Assessment Data Location of Assessment: [facility] TTS Assessment: In system Is this a Tele or Face-to-Face Assessment?: Tele Assessment Is this an Initial Assessment or a Re-assessment for this encounter?: Initial Assessment Marital status: Married Is patient pregnant?: No Pregnancy Status: No Living Arrangements: Spouse/significant other Can pt return to current living arrangement?: Yes Admission Status: Voluntary Is patient capable of signing voluntary admission?: Yes Referral Source: Self/Family/Friend Insurance type: UHC  Medicare     Crisis Care Plan Living Arrangements: Spouse/significant other Name of Psychiatrist: none provided Name of Therapist: none provided  Education Status Is patient currently in school?: No Highest grade of school patient has completed: 12th  Risk to self with the past 6 months Suicidal Ideation: No Has patient been a risk to self within the past 6 months prior to admission? : No Suicidal Intent: No Has patient had any suicidal intent within the past 6 months prior to admission? : No Is patient at risk for suicide?: No Suicidal Plan?: No Has patient had any suicidal plan within the past 6 months prior to admission? : No Access to Means: No What has been your use of drugs/alcohol within the last 12 months?: denies Previous Attempts/Gestures: No Triggers for Past Attempts: None known Intentional Self Injurious Behavior: None Family Suicide History: Unknown Recent stressful life event(s): Conflict (Comment) (pt denies but  according to notes, pt had conflict with wife) Persecutory voices/beliefs?: No Depression: No Depression Symptoms:  (denies) Substance abuse history and/or treatment for substance abuse?: No Suicide prevention information given to non-admitted patients: Not applicable  Risk to Others within the past 6 months Homicidal Ideation: No Does patient have any lifetime risk of violence toward others beyond the six months prior to admission? : No Thoughts of Harm to Others: No Current Homicidal Intent: No Current Homicidal Plan: No Access to Homicidal Means: No History of harm to others?: No Assessment of Violence: None Noted Does patient have access to weapons?: No Criminal Charges Pending?: No Does patient have a court date: No Is patient on probation?: No  Psychosis Hallucinations: None noted Delusions: None noted  Mental Status Report Appearance/Hygiene: In scrubs Eye Contact: Good Motor Activity: Unremarkable Speech: Logical/coherent Level of Consciousness: Alert Mood: Pleasant (pt was laughing and smiling throughout the assessment) Affect: Appropriate to circumstance Anxiety Level: None Thought Processes: Coherent, Relevant Judgement: Partial Orientation: Time (pt thought today was his b-day, thought he was at home) Obsessive Compulsive Thoughts/Behaviors: None  Cognitive Functioning Concentration: Fair Memory: Recent Impaired, Remote Impaired IQ: Average Insight: Poor Impulse Control: Fair Appetite: Poor (pt reports to eating 1x today and 0x yesterday) Sleep: Decreased Total Hours of Sleep: 4 (pt reports "haven't been sleeping good.") Vegetative Symptoms: Staying in bed (pt reports "we all feel like that sometimes.")  ADLScreening [facility] Assessment Services) Patient's cognitive ability adequate to safely complete daily activities?: Yes Patient able to express need for assistance with ADLs?: Yes Independently performs ADLs?: Yes (appropriate for developmental  age)  Prior Inpatient Therapy Prior Inpatient Therapy: No  Prior Outpatient Therapy Prior Outpatient Therapy: No Does patient have an ACCT team?: No Does patient have Intensive In-House Services?  : No Does patient have [facility] services? : No Does patient have P4CC services?: No  ADL Screening (condition at time of admission) Patient's cognitive ability adequate to safely complete daily activities?: Yes Is the patient deaf or have difficulty hearing?: Yes (difficulty hearing) Does the patient have difficulty seeing, even when wearing glasses/contacts?: No Does the patient have difficulty concentrating, remembering, or making decisions?: No Patient able to express need for assistance with ADLs?: Yes Does the patient have difficulty dressing or bathing?: No Independently performs ADLs?: Yes (appropriate for developmental age) Does the patient have difficulty walking or climbing stairs?: No Weakness of Legs: Both (pt reports he uses a cane when he is walking in public) Weakness of Arms/Hands: None  Home Assistive Devices/Equipment Home Assistive Devices/Equipment: Cane (specify quad or straight)  Abuse/Neglect Assessment (Assessment to be complete while patient is alone) Physical Abuse: Denies Verbal Abuse: Denies Sexual Abuse: Denies Exploitation of patient/patient's resources: Denies Self-Neglect: Denies     Advance Directives (For Healthcare) Does patient have an advance directive?: No Would patient like information on creating an advanced directive?: No - patient declined information (pt stated "I don't have anything to give to anyone.")    Additional Information 1:1 In Past 12 Months?: No CIRT Risk: No Elopement Risk: No Does patient have medical clearance?: Yes     Disposition: Per Nira Conn, FNP pt does not meet inpt criteria. Pt will need an AM psych eval. Delfin Edis Isom, RN has been notified.    Disposition Initial Assessment Completed for this Encounter:  Yes Disposition of Patient: Other dispositions Other disposition(s): Other (Comment) (AM psych eval)  Karolee Ohs 12/24/2015 10:28 PM

## 2015-12-24 NOTE — ED Notes (Signed)
Pt sitting in bed comfortably and eating food.  Family member and sitter at bedside.

## 2015-12-25 DIAGNOSIS — F4321 Adjustment disorder with depressed mood: Secondary | ICD-10-CM | POA: Diagnosis not present

## 2015-12-25 LAB — CBG MONITORING, ED
Glucose-Capillary: 128 mg/dL — ABNORMAL HIGH (ref 65–99)
Glucose-Capillary: 103 mg/dL — ABNORMAL HIGH (ref 65–99)

## 2015-12-25 MED ORDER — TRAZODONE HCL 50 MG PO TABS: 50.0000 mg | ORAL_TABLET | Freq: Every evening | ORAL | 0 refills | Status: DC | PRN

## 2015-12-25 MED ORDER — TRAZODONE HCL 50 MG PO TABS: 50.0000 mg | ORAL_TABLET | Freq: Every evening | ORAL | Status: DC | PRN

## 2015-12-25 MED ORDER — SALINE SPRAY 0.65 % NA SOLN
1.0000 | Freq: Once | NASAL | Status: AC
  Administered 2015-12-25: 1 via NASAL
  Filled 2015-12-25: qty 44

## 2015-12-25 NOTE — Progress Notes (Signed)
CSW spoke with patient regarding plans for discharge.Patient stated "I can not go back to that place"referring to his home. Patient and patients wife requested placement due to living conditions at home. Patient stated they are unable cook, clean, and "our home is a mess". Patient has been cleared medically and by psych. CSW gave patient a copy of assisted living facilities in the area, patient declined this option. Sitter suggested homeless shelter,patient declined this option. CSW left list of assisted living facilities for patient. CSW will continue to update.  Stacy Gardner, LCSWA Clinical Social Worker [phone]

## 2015-12-25 NOTE — Progress Notes (Signed)
CSW provided patient with taxi voucher.   Shawniece Oyola, LCSWA Clinical Social Worker [phone]  

## 2015-12-25 NOTE — Progress Notes (Signed)
CSW followed up with APS regarding patient and patients spouse.   Stacy Gardner, LCSWA Clinical Social Worker [phone]

## 2015-12-25 NOTE — Discharge Instructions (Signed)
Please read attached information. If you experience any new or worsening signs or symptoms please return to the emergency room for evaluation. Please follow-up with your primary care provider or specialist as discussed.  °

## 2015-12-25 NOTE — Progress Notes (Signed)
CSW spoke with patient and his wife, Veikko Belotti this AM with sitter present. Information was provided from the wife, as patient states "something is wrong with my head and a lot of stuff I don't remember. 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 She stated patient was at [facility] two weeks ago, however, his insurance "ran out". She stated they had some "fixing done to the house back in the winter". She stated the house is "awful, trashy, and old". She stated she they have not had anyway to "wash clothes in a long time". She stated she has hot and cold water in the bathroom and the refrigerator does not work anymore. She stated they have been "surviving in two rooms and their are some windows that need to be replaced. She stated they have one estranged son. She stated they receive mobile meals as the oven does not work. She stated the only receptacle working is the one in the bathroom. CSW asked specifically to describe the inside of the home and she stated trash, clothes, and her husbands tool stuff. She stated the house is an old home.   CSW left message for APS Social Worker with name and contact for call back.  Elenore Paddy [phone] ED CSW 12/25/2015 2:33 PM

## 2015-12-25 NOTE — ED Notes (Signed)
Pt sleeping with easy rise and fall of chest noted. Sitter and wife at bedside.

## 2015-12-25 NOTE — ED Notes (Addendum)
Per the patient and his wife, the home that they are currently living in needs to be condemned, is not a safe place for them to live and "we aren't able to fix it", pt ambulates with cane, reports that there is no running water in the kitchen, refrigerator is not working, washer dryer is not working, patient and wife are unable to drive and have no family support, SW made aware and is at bedside.
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
## 2015-12-25 NOTE — ED Notes (Signed)
Pt complains of nasal congestion  

## 2015-12-25 NOTE — ED Provider Notes (Signed)
74 year old male presented to the emergency room for psychiatric evaluation. He was medically cleared TTS was consult at. TTS wrote patient stable with no risk to himself no need for inpatient management. Case management asked if I would evaluate the patient, I spoke with the patient he has no thoughts of harming himself or others, no significant complaints. Patient is medically cleared, I informed him that he would be discharged from the emergency room. Patient reports that he does not want to leave as he does not want to go home. I informed him that the emergency room is not the appropriate place for him at this time, case management work on living arrangements.  Vitals:   12/25/15 1300 12/25/15 1626  BP: 97/56 115/63  Pulse: 63 61  Resp: 18 18  Temp: 98.1 F (36.7 C) 98.1 F (36.7 C)      Eyvonne Mechanic, PA-C 12/25/15 1917    Benjiman Core, MD 12/26/15 [id number]

## 2015-12-25 NOTE — [facility] Suicide Risk Assessment (Signed)
Suicide Risk Assessment  Discharge Assessment   [facility] Discharge Suicide Risk Assessment   Principal Problem: Adjustment disorder with depressed mood Discharge Diagnoses:  Patient Active Problem List   Diagnosis Date Noted  . Adjustment disorder with depressed mood [F43.21] 12/25/2015    Priority: High  . Memory deficits [R41.3] 12/01/2015  . Encephalopathy, metabolic [G93.41] 11/28/2015  . Cellulitis of second toe of left foot [L03.032]   . Acute left PCA stroke (HCC) [I63.532] 11/25/2015  . Wound dehiscence [T81.31XA] 03/03/2015  . 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 Cervical spondylosis with myelopathy [M47.12] 04/17/2014  . ICD (implantable cardioverter-defibrillator), biventricular, in situ [Z95.810] 11/19/2013  . Cardiac resynchronization therapy defibrillator (CRT-D) in place [Z95.810] 07/03/2013  . Chronic renal failure [N18.9] 01/24/2013  . CAD (coronary artery disease) [I25.10] 12/10/2012  . Chronic systolic heart failure (HCC) [I50.22] 11/20/2012  . Cardiomyopathy, ischemic [I25.5] 11/20/2012  . Atrial flutter (HCC) [I48.92] 11/08/2012  . 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 Hypoglycemia [E16.2] 10/29/2012  . Community acquired pneumonia [J18.9] 10/29/2012  . Acute encephalopathy [G93.40] 10/29/2012  . Cervical stenosis of spine [M48.02] 10/28/2011  . Spondylosis of cervical joint [M47.812] 10/28/2011  . Cervical myelopathy (HCC) [G95.9] 10/28/2011  . Quadriparesis (HCC) [G82.50] 10/28/2011  . DVT of right proximal brachial through the distal axillary vein, acute [I80.8] 10/06/2011  . Constipation due to pain medication [K59.03] 10/06/2011  . Venous insufficiency [I87.2] 10/05/2011  . 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 Hyperlipidemia [E78.5] 10/05/2011  . Normocytic anemia [D64.9] 10/03/2011  . Falls frequently [R29.6] 10/03/2011  .  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 HTN (hypertension) [I10] 10/03/2011  . Gait abnormality secondary to lumbar spinal stenosis [R26.9] 10/03/2011  . Arthritis [M19.90] 10/03/2011    Total Time spent with patient: 45 minutes  Musculoskeletal: Strength & Muscle Tone: within normal limits Gait & Station: normal Patient leans: N/A  Psychiatric Specialty Exam: Physical Exam  Constitutional: He is oriented to person, place, and time. He appears well-developed and well-nourished.  HENT:  Head: Normocephalic.  Neck: Normal range of motion.  Respiratory: Effort normal.  Musculoskeletal: Normal range of motion.  Neurological: He is alert and oriented to person, place, and time.  Skin: Skin is warm and dry.  Psychiatric: He has a normal mood and affect. His speech is normal and behavior is normal. Judgment and thought content normal. Cognition and memory are impaired.    Review of Systems  Constitutional: Negative.   HENT: Negative.   Eyes: Negative.   Respiratory: Negative.   Cardiovascular: Negative.   Gastrointestinal: Negative.   Genitourinary: Negative.   Musculoskeletal: Negative.   Skin: Negative.   Neurological: Negative.   Endo/Heme/Allergies: Negative.   Psychiatric/Behavioral: Positive for memory loss.    Blood pressure 167/71, pulse 68, temperature 97.3 F (36.3 C), temperature source Oral, resp. rate 20, SpO2 100 %.There is no height or weight on file to calculate BMI.  General Appearance: Casual  Eye Contact:  Good  Speech:  Normal Rate  Volume:  Normal  Mood:  Euthymic  Affect:  Congruent  Thought Process:  Coherent and Descriptions of Associations: Intact  Orientation:  Full (Time, Place, and Person)  Thought Content:  WDL  Suicidal Thoughts:  No  Homicidal Thoughts:  No  Memory:  Immediate;   Good Recent;   Fair Remote;   Fair  Judgement:  Fair  Insight:  Fair  Psychomotor  Activity:  Normal  Concentration:   Concentration: Good and Attention Span: Good  Recall:  Fair  Fund of Knowledge:  Good  Language:  Good  Akathisia:  No  Handed:  Right  AIMS (if indicated):     Assets:  Intimacy Leisure Time Physical Health Resilience Social Support  ADL's:  Intact  Cognition:  Impaired,  Mild  Sleep:      Mental Status Per Nursing Assessment::   On Admission:   foot pain, later depression  Demographic Factors:  Male, Age 75 or older and Caucasian  Loss Factors: NA  Historical Factors: NA  Risk Reduction Factors:   Sense of responsibility to family, Living with another person, especially a relative and Positive social support  Continued Clinical Symptoms:  None  Cognitive Features That Contribute To Risk:  None    Suicide Risk:  Minimal: No identifiable suicidal ideation.  Patients presenting with no risk factors but with morbid ruminations; may be classified as minimal risk based on the severity of the depressive symptoms    Plan Of Care/Follow-up recommendations:  Activity:  as tolerated Diet:  heart healthy diet  Ronney Honeywell, NP 12/25/2015, 10:53 AM

## 2015-12-26 LAB — URINE CULTURE

## 2015-12-26 SURGERY — APPENDECTOMY, LAPAROSCOPIC
Anesthesia: General

## 2015-12-29 ENCOUNTER — Telehealth: Payer: Self-pay | Admitting: Licensed Clinical Social Worker

## 2015-12-30 NOTE — Telephone Encounter (Signed)
Patient's wife called the [facility] to inquire about medication side effects and if they could "increase mood swings". Wife shared recent events of hospitalization, stay at [facility] and visit to ED last week. Wife reports increased mood swings since "he was at [facility]". She admits that he has a diagnosis of dementia and verbalizes understanding of possible decline. She denies any threatening behavior or suicidual ideation but states he has been argumentative. Recently, "he tried to go outside without his shoes on". Wife states he has insisted that "we are renting this home" and she reports we have owned it for many years. CSW provided supportive intervention and discussed some caregiving techniques with dementia patients such as distraction, not arguing rather agreeing and providing a safe environment. Patient's wife grateful for support. CSW reviewed upcoming appointments with wife and encouraged her to call if further needs arise. CSW will continue to be available as needed. Lasandra Beech, LCSW [phone]

## 2016-01-06 ENCOUNTER — Encounter: Payer: Medicare Other | Admitting: Internal Medicine

## 2016-01-14 ENCOUNTER — Telehealth: Payer: Self-pay | Admitting: Cardiology

## 2016-01-14 ENCOUNTER — Encounter: Payer: Medicare Other | Admitting: *Deleted

## 2016-01-14 NOTE — Telephone Encounter (Signed)
LMOVM reminding pt to send remote transmission.   

## 2016-01-15 ENCOUNTER — Encounter: Payer: Self-pay | Admitting: Cardiology

## 2016-01-19 ENCOUNTER — Inpatient Hospital Stay (HOSPITAL_COMMUNITY): Admission: RE | Admit: 2016-01-19 | Payer: Medicare Other | Source: Ambulatory Visit

## 2016-01-19 ENCOUNTER — Telehealth: Payer: Self-pay | Admitting: Neurology

## 2016-01-19 NOTE — Telephone Encounter (Signed)
Spoke with wife and gave her Dr Marlis Edelson recommendation to speak with his his PCP.  Advised her that if PCP feels patient needs to be seen sooner, to call back. Reminded her that patient's follow up with Dr Pearlean Brownie is next Tues. She then stated "we can wait until then." She stated his memory is not good; he talks about his mother who is deceased. Advised her to write down all her concerns, questions and bring next week to his appointment. She did confirm she will be with patient next Tues.  She verbalized understanding, appreciation of call.

## 2016-01-19 NOTE — Telephone Encounter (Signed)
Wife called to ask "Is it normal for a stroke patient to hear a different language", states husband was looking at the magazine of his favorite shotgun and said it's in a foreign language, said it's in spanish, told her to "throw em away, they're in a foreign language", hears television in french, states he has a bad hearing problem, "he can hear me 2 feet away but can't hear the tv on 100".

## 2016-01-19 NOTE — Telephone Encounter (Signed)
It would be unusual to have these symptoms now but not initially with his stroke. Ask them to start with primary MD and if he/she  thinks patient needs to be seen sooner than his scheduled appt would work him in sooner

## 2016-01-25 ENCOUNTER — Telehealth (HOSPITAL_COMMUNITY): Payer: Self-pay | Admitting: Vascular Surgery

## 2016-01-25 NOTE — Telephone Encounter (Signed)
Left pt wife message about rescheduled appt w/ [facility]

## 2016-01-26 ENCOUNTER — Ambulatory Visit (HOSPITAL_COMMUNITY)
Admission: EM | Admit: 2016-01-26 | Discharge: 2016-01-26 | Disposition: A | Discharge status: Home / Self Care | Payer: Medicare Other | Attending: Family Medicine | Admitting: Family Medicine

## 2016-01-26 ENCOUNTER — Ambulatory Visit: Payer: Medicare Other | Admitting: Neurology

## 2016-01-26 ENCOUNTER — Other Ambulatory Visit (HOSPITAL_COMMUNITY): Payer: Self-pay | Admitting: Cardiology

## 2016-01-26 ENCOUNTER — Encounter (HOSPITAL_COMMUNITY): Payer: Self-pay | Admitting: Family Medicine

## 2016-01-26 DIAGNOSIS — H6123 Impacted cerumen, bilateral: Secondary | ICD-10-CM | POA: Diagnosis not present

## 2016-01-26 DIAGNOSIS — J069 Acute upper respiratory infection, unspecified: Secondary | ICD-10-CM | POA: Diagnosis not present

## 2016-01-26 MED ORDER — IPRATROPIUM BROMIDE 0.06 % NA SOLN: 2.0000 | Freq: Four times a day (QID) | NASAL | 1 refills | Status: DC

## 2016-01-26 NOTE — ED Triage Notes (Signed)
Pt here for cough and sinus congestion

## 2016-01-26 NOTE — ED Provider Notes (Signed)
[facility]    CSN: [id number] Arrival date & time: 01/26/16  1545     History   Chief Complaint Chief Complaint  Patient presents with  . URI    HPI [name] is a 75 y.o. male.   The history is provided by the patient.  URI  Presenting symptoms: congestion, cough and rhinorrhea   Presenting symptoms: no fever and no sore throat   Severity:  Mild Onset quality:  Gradual Duration:  5 days Progression:  Unchanged Chronicity:  New Relieved by:  OTC medications Worsened by:  Nothing Ineffective treatments:  OTC medications Risk factors: being elderly     Past Medical History:  Diagnosis Date  . Acute encephalopathy   . AICD (automatic cardioverter/defibrillator) present   . Anemia   . Arthritis    "all over" (05/16/2013)  . Atrial flutter (HCC)   . CAD (coronary artery disease)   . Cardiomyopathy (HCC)    a. 10/2012 Echo: EF 20-25%, diff HK, mod dil LA/RV/RA.  Marland Kitchen Cervical myelopathy (HCC)   . Cervical spinal stenosis   . Chronic systolic congestive heart failure (HCC)   . CKD (chronic kidney disease), stage III   . 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 Depression    "son died in service in 09/01/1990; still bothers me" (05/16/2013)  . DVT (deep venous thrombosis) (HCC) 10/2011   RUE; pt denies this hx on 05/16/2013  . Dysrhythmia   . Frequent falls   . GERD (gastroesophageal reflux disease)   . H/O hiatal hernia   . Hyperlipidemia   . Hypertension   . Hypoglycemia   . Lumbar spinal stenosis   . Mononeuritis of unspecified site   . Myocardial infarction   . Pneumonia 1960   , also 2014  . Quadriparesis (HCC) Sep 01, 2011   pre op  . Shortness of breath dyspnea    with exertion  . Type II diabetes mellitus (HCC)    type 2  . Venous insufficiency     Patient Active Problem List   Diagnosis Date Noted  . Adjustment disorder with depressed mood 12/25/2015  . Memory  deficits 12/01/2015  . Encephalopathy, metabolic 11/28/2015  . Cellulitis of second toe of left foot   . Acute left PCA stroke (HCC) 11/25/2015  . Wound dehiscence 03/03/2015  . Lumbar stenosis with neurogenic claudication 02/02/2015  . Cervical spondylosis with myelopathy 04/17/2014  . ICD (implantable cardioverter-defibrillator), biventricular, in situ 11/19/2013  . Cardiac resynchronization therapy defibrillator (CRT-D) in place 07/03/2013  . Chronic renal failure 01/24/2013  . CAD (coronary artery disease) 12/10/2012  . Chronic systolic heart failure (HCC) 11/20/2012  . Cardiomyopathy, ischemic 11/20/2012  . Atrial flutter (HCC) 11/08/2012  . Dilated cardiomyopathy (HCC) 11/01/2012  . Hypoglycemia 10/29/2012  . Community acquired pneumonia 10/29/2012  . Acute encephalopathy 10/29/2012  . Cervical stenosis of spine 10/28/2011  . Spondylosis of cervical joint 10/28/2011  . Cervical myelopathy (HCC) 10/28/2011  . Quadriparesis (HCC) 10/28/2011  . DVT of right proximal brachial through the distal axillary vein, acute 10/06/2011  . Constipation due to pain medication 10/06/2011  . Venous insufficiency 10/05/2011  . 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 Hyperlipidemia 10/05/2011  . Normocytic anemia 10/03/2011  . Falls frequently 10/03/2011  . Diabetes mellitus with chronic kidney disease (HCC) 10/03/2011  . HTN (hypertension) 10/03/2011  . Gait  abnormality secondary to lumbar spinal stenosis 10/03/2011  . Arthritis 10/03/2011    Past Surgical History:  Procedure Laterality Date  . ANTERIOR CERVICAL DECOMP/DISCECTOMY FUSION  10/31/2011   Procedure: ANTERIOR CERVICAL DECOMPRESSION/DISCECTOMY FUSION 2 LEVELS;  Surgeon: Cristi Loron, MD;  Location: [facility];  Service: Neurosurgery;  Laterality: N/A;  Cervical Four-Five Cervical Five-Six Anterior Cervical Decompression with fusion interbody prothesis  plating and bonegraft  . ANTERIOR CERVICAL DECOMP/DISCECTOMY FUSION N/A 04/17/2014   Procedure: CERVICAL THREE TO FOUR ANTERIOR CERVICAL DECOMPRESSION/DISCECTOMY FUSION 1 LEVEL/HARDWARE REMOVAL;  Surgeon: Tressie Stalker, MD;  Location: [facility];  Service: Neurosurgery;  Laterality: N/A;  C34 anterior cervical decompression with fusion interbody prosthesis plating and bonegraft with exploration of prev fusion and possible removal of old hardware  . BI-VENTRICULAR IMPLANTABLE CARDIOVERTER DEFIBRILLATOR N/A 05/16/2013   Procedure: BI-VENTRICULAR IMPLANTABLE CARDIOVERTER DEFIBRILLATOR  (CRT-D);  Surgeon: Marinus Maw, MD;  Location: [facility] CATH LAB;  Service: Cardiovascular;  Laterality: N/A;  . BI-VENTRICULAR IMPLANTABLE CARDIOVERTER DEFIBRILLATOR  (CRT-D) Left 05/16/2013   STJ CRTD implanted by Dr Ladona Ridgel for primary prevention/CHF  . CARDIAC CATHETERIZATION  10/2012  . CARPAL TUNNEL RELEASE Right ~ 2013  . CATARACT EXTRACTION W/ INTRAOCULAR LENS IMPLANT Bilateral   . JOINT REPLACEMENT  2013  . LEFT AND RIGHT HEART CATHETERIZATION WITH CORONARY ANGIOGRAM N/A 11/06/2012   Procedure: LEFT AND RIGHT HEART CATHETERIZATION WITH CORONARY ANGIOGRAM;  Surgeon: Laurey Morale, MD;  Location: [facility] CATH LAB;  Service: Cardiovascular;  Laterality: N/A;  . LUMBAR LAMINECTOMY/DECOMPRESSION MICRODISCECTOMY N/A 02/02/2015   Procedure: LUMBAR LAMINECTOMY/DECOMPRESSION MICRODISCECTOMY 2 LEVELS;  Surgeon: Tressie Stalker, MD;  Location: [facility];  Service: Neurosurgery;  Laterality: N/A;  L34 L45 laminectomy and foraminotomy  . LUMBAR WOUND DEBRIDEMENT N/A 03/04/2015   Procedure: Incision and Drainage of LUMBAR WOUND ;  Surgeon: Tressie Stalker, MD;  Location: [facility];  Service: Neurosurgery;  Laterality: N/A;  . 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 toe removal  Left    3rd and 4th toe removed  . TOTAL HIP ARTHROPLASTY Right ~ 2012       Home Medications    Prior to Admission  medications   Medication Sig Start Date End Date Taking? Authorizing Provider  acarbose (PRECOSE) 50 MG tablet Take 1 tablet (50 mg total) by mouth 3 (three) times daily with meals. 12/16/15   Sharon Seller, NP  carvedilol (COREG) 12.5 MG tablet Take 1 tablet (12.5 mg total) by mouth 2 (two) times daily with a meal. 12/16/15   Sharon Seller, NP  docusate sodium (COLACE) 100 MG capsule Take 300 mg by mouth 2 (two) times daily.     Historical Provider, MD  fluticasone (FLONASE) 50 MCG/ACT nasal spray Place 2 sprays into both nostrils daily as needed for allergies. 12/16/15   Sharon Seller, NP  furosemide (LASIX) 40 MG tablet Take 1 tablet (40 mg total) by mouth daily. 12/16/15   Sharon Seller, NP  gabapentin (NEURONTIN) 300 MG capsule Take 1 capsule (300 mg total) by mouth 3 (three) times daily. 12/16/15   Sharon Seller, NP  glimepiride (AMARYL) 2 MG tablet Take 2 mg by mouth 2 (two) times daily.    Historical Provider, MD  hydrALAZINE (APRESOLINE) 25 MG tablet TAKE ONE-HALF TABLET BY MOUTH THREE TIMES DAILY Patient taking differently: Take 12.5 mg by mouth 3 (three) times daily.  12/16/15   Sharon Seller, NP  HYDROcodone-acetaminophen (NORCO/VICODIN) 5-325 MG tablet Take  2 tablets by mouth every 8 (eight) hours as needed for moderate pain. 11/27/15   Renae FickleMackenzie Short, MD  isosorbide mononitrate (IMDUR) 30 MG 24 hr tablet Take 1 tablet (30 mg total) by mouth daily. 12/16/15   Sharon SellerJessica K Eubanks, NP  ivabradine (CORLANOR) 5 MG TABS tablet Take 1 tablet (5 mg total) by mouth 2 (two) times daily with a meal. 12/16/15   Sharon SellerJessica K Eubanks, NP  magnesium hydroxide (MILK OF MAGNESIA) 400 MG/5ML suspension Take 30 mLs by mouth daily as needed. Patient taking differently: Take 30 mLs by mouth daily as needed for mild constipation or moderate constipation.  12/16/15   Sharon SellerJessica K Eubanks, NP  metFORMIN (GLUCOPHAGE) 1000 MG tablet Take 1,000 mg by mouth 2 (two) times daily with a meal.    Historical  Provider, MD  methocarbamol (ROBAXIN) 500 MG tablet Take 500 mg by mouth once.    Historical Provider, MD  Rivaroxaban (XARELTO) 15 MG TABS tablet Take 1 tablet (15 mg total) by mouth daily with supper. 12/16/15   Sharon SellerJessica K Eubanks, NP  rosuvastatin (CRESTOR) 10 MG tablet Take 1 tablet (10 mg total) by mouth daily at 6 PM. 12/16/15   Sharon SellerJessica K Eubanks, NP  saw palmetto 500 MG capsule Take 1,500 mg by mouth 3 (three) times daily.     Historical Provider, MD  Skin Protectants, Misc. (EUCERIN) cream Apply 1 application topically 2 (two) times daily as needed for dry skin. Apply to shin     Historical Provider, MD  sodium chloride (DEEP SEA NASAL SPRAY) 0.65 % nasal spray Place 2 sprays into the nose every 2 (two) hours as needed for congestion. 12/16/15   Sharon SellerJessica K Eubanks, NP  traZODone (DESYREL) 50 MG tablet Take 1 tablet (50 mg total) by mouth at bedtime as needed for sleep. 12/25/15   Charm RingsJamison Y Lord, NP    Family History Family History  Problem Relation Age of Onset  . Diabetes Brother   . Cancer Father     Lung cancer  . Heart attack Mother     pt thinks she had MI  . Cancer Mother     Social History Social History  Substance Use Topics  . Smoking status: Never Smoker  . Smokeless tobacco: Never Used  . Alcohol use No     Comment: 05/16/2013 "used to drink a little bit a long time ago; nothing in over 30 yrs"     Allergies   Acyclovir and related; Dristan cold [chlorphen-pe-acetaminophen]; and Prednisone   Review of Systems Review of Systems  Constitutional: Negative.  Negative for fever.  HENT: Positive for congestion, postnasal drip and rhinorrhea. Negative for sore throat.   Respiratory: Positive for cough. Negative for shortness of breath.   Cardiovascular: Negative.   All other systems reviewed and are negative.    Physical Exam Triage Vital Signs ED Triage Vitals [01/26/16 1610]  Enc Vitals Group     BP 127/69     Pulse Rate 70     Resp 16     Temp 98.7 F (37.1  C)     Temp src      SpO2 97 %     Weight      Height      Head Circumference      Peak Flow      Pain Score      Pain Loc      Pain Edu?      Excl. in GC?    No data  found.   Updated Vital Signs BP 127/69   Pulse 70   Temp 98.7 F (37.1 C)   Resp 16   SpO2 97%   Visual Acuity Right Eye Distance:   Left Eye Distance:   Bilateral Distance:    Right Eye Near:   Left Eye Near:    Bilateral Near:     Physical Exam  Constitutional: He appears well-developed and well-nourished. No distress.  HENT:  Right Ear: External ear normal. Decreased hearing is noted.  Left Ear: External ear normal. Decreased hearing is noted.  Ears:  Nose: Nose normal.  Mouth/Throat: Oropharynx is clear and moist.  Eyes: Pupils are equal, round, and reactive to light.  Neck: Normal range of motion. Neck supple.  Cardiovascular: Normal rate, regular rhythm and normal heart sounds.   Pulmonary/Chest: Effort normal and breath sounds normal.  Lymphadenopathy:    He has no cervical adenopathy.  Skin: Skin is warm and dry.  Nursing note and vitals reviewed.    UC Treatments / Results  Labs (all labs ordered are listed, but only abnormal results are displayed) Labs Reviewed - No data to display  EKG  EKG Interpretation None       Radiology No results found.  Procedures Procedures (including critical care time)  Medications Ordered in UC Medications - No data to display   Initial Impression / Assessment and Plan / UC Course  I have reviewed the triage vital signs and the nursing notes.  Pertinent labs & imaging results that were available during my care of the patient were reviewed by me and considered in my medical decision making (see chart for details).  Clinical Course    Ears irrigated til clear, canals clear, tm wnl, sx much improved.  Final Clinical Impressions(s) / UC Diagnoses   Final diagnoses:  None    New Prescriptions New Prescriptions   No medications  on file     Linna Hoff, MD 01/26/16 1656

## 2016-02-02 ENCOUNTER — Encounter (HOSPITAL_COMMUNITY): Payer: Medicare Other

## 2016-02-03 ENCOUNTER — Other Ambulatory Visit (HOSPITAL_COMMUNITY): Payer: Self-pay | Admitting: Internal Medicine

## 2016-02-03 ENCOUNTER — Other Ambulatory Visit: Payer: Self-pay | Admitting: Nurse Practitioner

## 2016-02-03 ENCOUNTER — Encounter (HOSPITAL_COMMUNITY): Payer: Medicare Other

## 2016-02-03 DIAGNOSIS — E785 Hyperlipidemia, unspecified: Secondary | ICD-10-CM

## 2016-02-03 DIAGNOSIS — I5022 Chronic systolic (congestive) heart failure: Secondary | ICD-10-CM

## 2016-02-03 DIAGNOSIS — N182 Chronic kidney disease, stage 2 (mild): Secondary | ICD-10-CM

## 2016-02-03 DIAGNOSIS — E1122 Type 2 diabetes mellitus with diabetic chronic kidney disease: Secondary | ICD-10-CM

## 2016-02-03 DIAGNOSIS — I6990 Unspecified sequelae of unspecified cerebrovascular disease: Secondary | ICD-10-CM

## 2016-02-15 ENCOUNTER — Non-Acute Institutional Stay (INDEPENDENT_AMBULATORY_CARE_PROVIDER_SITE_OTHER): Payer: Medicare Other | Admitting: Family Medicine

## 2016-02-15 DIAGNOSIS — R296 Repeated falls: Secondary | ICD-10-CM

## 2016-02-15 NOTE — Progress Notes (Signed)
Note generated to remove patient from [facility] list 

## 2016-02-19 ENCOUNTER — Encounter: Payer: Self-pay | Admitting: Cardiology

## 2016-02-19 NOTE — Progress Notes (Signed)
Letter  

## 2016-02-22 ENCOUNTER — Inpatient Hospital Stay (HOSPITAL_COMMUNITY): Admission: RE | Admit: 2016-02-22 | Payer: Medicare Other | Source: Ambulatory Visit

## 2016-02-29 ENCOUNTER — Other Ambulatory Visit (HOSPITAL_COMMUNITY): Payer: Self-pay | Admitting: *Deleted

## 2016-03-27 ENCOUNTER — Encounter (HOSPITAL_COMMUNITY): Payer: Self-pay | Admitting: Emergency Medicine

## 2016-03-27 ENCOUNTER — Emergency Department (HOSPITAL_COMMUNITY): Payer: Medicare Other

## 2016-03-27 ENCOUNTER — Observation Stay (HOSPITAL_COMMUNITY)
Admission: EM | Admit: 2016-03-27 | Discharge: 2016-03-29 | Disposition: A | Discharge status: Home / Self Care | Payer: Medicare Other | Attending: Internal Medicine | Admitting: Internal Medicine

## 2016-03-27 DIAGNOSIS — S0990XA Unspecified injury of head, initial encounter: Secondary | ICD-10-CM | POA: Diagnosis not present

## 2016-03-27 DIAGNOSIS — Z96641 Presence of right artificial hip joint: Secondary | ICD-10-CM | POA: Insufficient documentation

## 2016-03-27 DIAGNOSIS — F0391 Unspecified dementia with behavioral disturbance: Secondary | ICD-10-CM | POA: Diagnosis not present

## 2016-03-27 DIAGNOSIS — S299XXA Unspecified injury of thorax, initial encounter: Secondary | ICD-10-CM | POA: Diagnosis not present

## 2016-03-27 DIAGNOSIS — G9340 Encephalopathy, unspecified: Secondary | ICD-10-CM | POA: Diagnosis present

## 2016-03-27 DIAGNOSIS — Z95810 Presence of automatic (implantable) cardiac defibrillator: Secondary | ICD-10-CM | POA: Diagnosis not present

## 2016-03-27 DIAGNOSIS — W06XXXA Fall from bed, initial encounter: Secondary | ICD-10-CM | POA: Diagnosis not present

## 2016-03-27 DIAGNOSIS — I130 Hypertensive heart and chronic kidney disease with heart failure and stage 1 through stage 4 chronic kidney disease, or unspecified chronic kidney disease: Secondary | ICD-10-CM | POA: Diagnosis not present

## 2016-03-27 DIAGNOSIS — I252 Old myocardial infarction: Secondary | ICD-10-CM | POA: Insufficient documentation

## 2016-03-27 DIAGNOSIS — N183 Chronic kidney disease, stage 3 unspecified: Secondary | ICD-10-CM | POA: Diagnosis present

## 2016-03-27 DIAGNOSIS — S79911A Unspecified injury of right hip, initial encounter: Secondary | ICD-10-CM | POA: Diagnosis not present

## 2016-03-27 DIAGNOSIS — R1084 Generalized abdominal pain: Secondary | ICD-10-CM | POA: Diagnosis not present

## 2016-03-27 DIAGNOSIS — Z8673 Personal history of transient ischemic attack (TIA), and cerebral infarction without residual deficits: Secondary | ICD-10-CM | POA: Diagnosis not present

## 2016-03-27 DIAGNOSIS — E785 Hyperlipidemia, unspecified: Secondary | ICD-10-CM | POA: Diagnosis not present

## 2016-03-27 DIAGNOSIS — E119 Type 2 diabetes mellitus without complications: Secondary | ICD-10-CM

## 2016-03-27 DIAGNOSIS — N179 Acute kidney failure, unspecified: Principal | ICD-10-CM | POA: Insufficient documentation

## 2016-03-27 DIAGNOSIS — Z9114 Patient's other noncompliance with medication regimen: Secondary | ICD-10-CM | POA: Insufficient documentation

## 2016-03-27 DIAGNOSIS — S199XXA Unspecified injury of neck, initial encounter: Secondary | ICD-10-CM | POA: Diagnosis not present

## 2016-03-27 DIAGNOSIS — I5022 Chronic systolic (congestive) heart failure: Secondary | ICD-10-CM | POA: Diagnosis not present

## 2016-03-27 DIAGNOSIS — R2681 Unsteadiness on feet: Secondary | ICD-10-CM | POA: Diagnosis not present

## 2016-03-27 DIAGNOSIS — Z7984 Long term (current) use of oral hypoglycemic drugs: Secondary | ICD-10-CM | POA: Diagnosis not present

## 2016-03-27 DIAGNOSIS — M25561 Pain in right knee: Secondary | ICD-10-CM | POA: Diagnosis not present

## 2016-03-27 DIAGNOSIS — R531 Weakness: Secondary | ICD-10-CM | POA: Diagnosis not present

## 2016-03-27 DIAGNOSIS — E1122 Type 2 diabetes mellitus with diabetic chronic kidney disease: Secondary | ICD-10-CM | POA: Insufficient documentation

## 2016-03-27 DIAGNOSIS — I82509 Chronic embolism and thrombosis of unspecified deep veins of unspecified lower extremity: Secondary | ICD-10-CM | POA: Diagnosis not present

## 2016-03-27 DIAGNOSIS — Z7901 Long term (current) use of anticoagulants: Secondary | ICD-10-CM | POA: Diagnosis not present

## 2016-03-27 DIAGNOSIS — R269 Unspecified abnormalities of gait and mobility: Secondary | ICD-10-CM | POA: Diagnosis not present

## 2016-03-27 DIAGNOSIS — I2510 Atherosclerotic heart disease of native coronary artery without angina pectoris: Secondary | ICD-10-CM | POA: Diagnosis present

## 2016-03-27 DIAGNOSIS — K219 Gastro-esophageal reflux disease without esophagitis: Secondary | ICD-10-CM | POA: Insufficient documentation

## 2016-03-27 DIAGNOSIS — S3992XA Unspecified injury of lower back, initial encounter: Secondary | ICD-10-CM | POA: Diagnosis not present

## 2016-03-27 DIAGNOSIS — N189 Chronic kidney disease, unspecified: Secondary | ICD-10-CM | POA: Diagnosis not present

## 2016-03-27 DIAGNOSIS — I10 Essential (primary) hypertension: Secondary | ICD-10-CM | POA: Diagnosis present

## 2016-03-27 DIAGNOSIS — M549 Dorsalgia, unspecified: Secondary | ICD-10-CM | POA: Diagnosis not present

## 2016-03-27 DIAGNOSIS — F03918 Unspecified dementia, unspecified severity, with other behavioral disturbance: Secondary | ICD-10-CM | POA: Diagnosis present

## 2016-03-27 HISTORY — DX: Cerebral infarction, unspecified: I63.9

## 2016-03-27 HISTORY — DX: Unqualified visual loss, right eye, normal vision left eye: H54.61

## 2016-03-27 HISTORY — DX: Unspecified dementia, unspecified severity, without behavioral disturbance, psychotic disturbance, mood disturbance, and anxiety: F03.90

## 2016-03-27 LAB — CBC WITH DIFFERENTIAL/PLATELET
BASOS ABS: 0 10*3/uL (ref 0.0–0.1)
BASOS PCT: 1 %
EOS ABS: 0.3 10*3/uL (ref 0.0–0.7)
EOS PCT: 3 %
HCT: 35.5 % — ABNORMAL LOW (ref 39.0–52.0)
Hemoglobin: 11.6 g/dL — ABNORMAL LOW (ref 13.0–17.0)
LYMPHS PCT: 23 %
Lymphs Abs: 2 10*3/uL (ref 0.7–4.0)
MCH: 29.3 pg (ref 26.0–34.0)
MCHC: 32.7 g/dL (ref 30.0–36.0)
MCV: 89.6 fL (ref 78.0–100.0)
MONO ABS: 0.6 10*3/uL (ref 0.1–1.0)
Monocytes Relative: 7 %
Neutro Abs: 5.7 10*3/uL (ref 1.7–7.7)
Neutrophils Relative %: 66 %
PLATELETS: 172 10*3/uL (ref 150–400)
RBC: 3.96 MIL/uL — ABNORMAL LOW (ref 4.22–5.81)
RDW: 15.1 % (ref 11.5–15.5)
WBC: 8.6 10*3/uL (ref 4.0–10.5)

## 2016-03-27 LAB — GLUCOSE, CAPILLARY
GLUCOSE-CAPILLARY: 225 mg/dL — AB (ref 65–99)
GLUCOSE-CAPILLARY: 197 mg/dL — AB (ref 65–99)
Glucose-Capillary: 138 mg/dL — ABNORMAL HIGH (ref 65–99)

## 2016-03-27 LAB — BASIC METABOLIC PANEL
ANION GAP: 11 (ref 5–15)
BUN: 24 mg/dL — ABNORMAL HIGH (ref 6–20)
CALCIUM: 10.1 mg/dL (ref 8.9–10.3)
CO2: 24 mmol/L (ref 22–32)
Chloride: 107 mmol/L (ref 101–111)
Creatinine, Ser: 1.99 mg/dL — ABNORMAL HIGH (ref 0.61–1.24)
GFR calc Af Amer: 36 mL/min — ABNORMAL LOW (ref >60)
GFR, EST NON AFRICAN AMERICAN: 31 mL/min — AB (ref >60)
GLUCOSE: 196 mg/dL — AB (ref 65–99)
Potassium: 4 mmol/L (ref 3.5–5.1)
SODIUM: 142 mmol/L (ref 135–145)

## 2016-03-27 LAB — CBG MONITORING, ED: GLUCOSE-CAPILLARY: 175 mg/dL — AB (ref 65–99)

## 2016-03-27 LAB — URINALYSIS, ROUTINE W REFLEX MICROSCOPIC
BILIRUBIN URINE: NEGATIVE
HGB URINE DIPSTICK: NEGATIVE
Ketones, ur: NEGATIVE mg/dL
LEUKOCYTES UA: NEGATIVE
Nitrite: NEGATIVE
Protein, ur: 30 mg/dL — AB
SPECIFIC GRAVITY, URINE: 1.016 (ref 1.005–1.030)
pH: 5 (ref 5.0–8.0)

## 2016-03-27 LAB — CREATININE, URINE, RANDOM: Creatinine, Urine: 270.53 mg/dL

## 2016-03-27 LAB — TSH: TSH: 0.946 u[IU]/mL (ref 0.350–4.500)

## 2016-03-27 LAB — PHOSPHORUS: Phosphorus: 3.3 mg/dL (ref 2.5–4.6)

## 2016-03-27 LAB — MAGNESIUM: Magnesium: 2 mg/dL (ref 1.7–2.4)

## 2016-03-27 MED ORDER — INSULIN ASPART 100 UNIT/ML ~~LOC~~ SOLN
0.0000 [IU] | Freq: Three times a day (TID) | SUBCUTANEOUS | Status: DC
  Administered 2016-03-27: 2 [IU] via SUBCUTANEOUS
  Administered 2016-03-27: 5 [IU] via SUBCUTANEOUS
  Administered 2016-03-28: 8 [IU] via SUBCUTANEOUS
  Administered 2016-03-28 – 2016-03-29 (×2): 3 [IU] via SUBCUTANEOUS
  Administered 2016-03-29: 2 [IU] via SUBCUTANEOUS
  Administered 2016-03-29: 5 [IU] via SUBCUTANEOUS

## 2016-03-27 MED ORDER — SODIUM CHLORIDE 0.9 % IV SOLN
INTRAVENOUS | Status: AC
  Administered 2016-03-27: 09:00:00 via INTRAVENOUS

## 2016-03-27 MED ORDER — POLYETHYLENE GLYCOL 3350 17 G PO PACK: 17.0000 g | PACK | Freq: Every day | ORAL | Status: DC | PRN

## 2016-03-27 MED ORDER — QUETIAPINE FUMARATE ER 50 MG PO TB24: 50.0000 mg | ORAL_TABLET | Freq: Every evening | ORAL | Status: DC | PRN

## 2016-03-27 MED ORDER — ACETAMINOPHEN 650 MG RE SUPP: 650.0000 mg | Freq: Four times a day (QID) | RECTAL | Status: DC | PRN

## 2016-03-27 MED ORDER — ASPIRIN 81 MG PO CHEW
324.0000 mg | CHEWABLE_TABLET | Freq: Once | ORAL | Status: AC
  Administered 2016-03-27: 324 mg via ORAL
  Filled 2016-03-27: qty 4

## 2016-03-27 MED ORDER — ACETAMINOPHEN 325 MG PO TABS
650.0000 mg | ORAL_TABLET | Freq: Four times a day (QID) | ORAL | Status: DC | PRN
  Administered 2016-03-27: 650 mg via ORAL
  Filled 2016-03-27: qty 2

## 2016-03-27 MED ORDER — SODIUM CHLORIDE 0.9 % IV BOLUS (SEPSIS)
500.0000 mL | Freq: Once | INTRAVENOUS | Status: AC
  Administered 2016-03-27: 500 mL via INTRAVENOUS

## 2016-03-27 MED ORDER — SODIUM CHLORIDE 0.9% FLUSH
3.0000 mL | Freq: Two times a day (BID) | INTRAVENOUS | Status: DC
  Administered 2016-03-27 – 2016-03-29 (×3): 3 mL via INTRAVENOUS

## 2016-03-27 MED ORDER — HYDRALAZINE HCL 20 MG/ML IJ SOLN: 10.0000 mg | Freq: Three times a day (TID) | INTRAMUSCULAR | Status: DC | PRN

## 2016-03-27 MED ORDER — ROSUVASTATIN CALCIUM 5 MG PO TABS
10.0000 mg | ORAL_TABLET | Freq: Every day | ORAL | Status: DC
  Administered 2016-03-27 – 2016-03-29 (×3): 10 mg via ORAL
  Filled 2016-03-27 (×3): qty 2

## 2016-03-27 MED ORDER — QUETIAPINE FUMARATE 50 MG PO TABS
50.0000 mg | ORAL_TABLET | Freq: Every evening | ORAL | Status: DC | PRN
  Administered 2016-03-27: 50 mg via ORAL
  Filled 2016-03-27: qty 1

## 2016-03-27 NOTE — ED Triage Notes (Signed)
Pt was woke by wife and he fell out of bed. Pt is c/o of bilateral flank pain and back pain. Denies LOC. Was able to ambulate with assistance to stretcher for transport.

## 2016-03-27 NOTE — Progress Notes (Signed)
Patient verbally aggressive, agitation noted with complaints of food, "being in the hospital.  Redirected, patient later apologized calmed down.  Asked to used phone to call wife.  Phone at hand, assisted in calling spouse at [phone], call went unanswered.

## 2016-03-27 NOTE — ED Notes (Signed)
Spouse [phone]. Need for transport

## 2016-03-27 NOTE — ED Notes (Signed)
Ambulated Pt full assist. Pt unsteady gait. Did not tolerate well.

## 2016-03-27 NOTE — Progress Notes (Signed)
Patient arrived to [facility] on stretcher.  Assisted into bed without difficulty.  Alert to person, and time.  Stated he "thought it was Christmas Eve".  Cardiac monitor on, and verified. No complaints of pain or discomfort.  Cooperative with care.

## 2016-03-27 NOTE — ED Notes (Signed)
ED Provider at bedside. 

## 2016-03-27 NOTE — Evaluation (Signed)
Physical Therapy Evaluation Patient Details Name: [name] MRN: [id number] DOB: 1941/01/18 Today's Date: 03/27/2016   History of Present Illness  75 yo male s/p fall from bed. 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 pmhhx significant for CVA, CAD, CHF, memory problems, HLD, HTN, falls, CKD   Clinical Impression  Pt admitted with above diagnosis. Pt currently with functional limitations due to the deficits listed below (see PT Problem List). See below for eval details. Pt will benefit from skilled PT to increase their independence and safety with mobility to allow discharge to the venue listed below.      Follow Up Recommendations SNF;Supervision/Assistance - 24 hour    Equipment Recommendations  None recommended by PT    Recommendations for Other Services       Precautions / Restrictions Precautions Precautions: Fall Precaution Comments: high fall risk      Mobility  Bed Mobility               General bed mobility comments: Pt received in recliner.  Transfers Overall transfer level: Needs assistance Equipment used: Rolling walker (2 wheeled) Transfers: Sit to/from [employer] CorporationStand;Stand Pivot Transfers Sit to Stand: Min assist Stand pivot transfers: Min assist       General transfer comment: verbal cues for hand placement and sequencing  Ambulation/Gait Ambulation/Gait assistance: Min assist Ambulation Distance (Feet): 40 Feet Assistive device: Rolling walker (2 wheeled) Gait Pattern/deviations: Trunk flexed;Step-through pattern;Decreased stride length Gait velocity: decreased Gait velocity interpretation: Below normal speed for age/gender General Gait Details: assist with RW management. Pt reports he furniture walks at home  Stairs            Wheelchair Mobility    Modified Rankin (Stroke Patients Only)       Balance Overall balance assessment: Needs assistance Sitting-balance support: No upper extremity supported;Feet  supported Sitting balance-Leahy Scale: Fair     Standing balance support: Bilateral upper extremity supported;During functional activity Standing balance-Leahy Scale: Poor                               Pertinent Vitals/Pain Pain Assessment: No/denies pain    Home Living Family/patient expects to be discharged to:: Private residence Living Arrangements: Spouse/significant other Available Help at Discharge: Family Type of Home: House Home Access: Stairs to enter Entrance Stairs-Rails: Right Entrance Stairs-Number of Steps: 2 Home Layout: Multi-level;Able to live on main level with bedroom/bathroom Home Equipment: Walker - 4 wheels;Bedside commode;Wheelchair - manual;Hospital bed;Hand held Careers information officershower head;Walker - 2 wheels;Grab bars - tub/shower;Shower seat Additional Comments: Still have not corroborated the home living with his wife.  Pt unable to remember anything and expressive deficits. called patients home 3 different times.    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 pt reports all his meals have to be bought at resturants for him to have food to eat. pt reports that his neighbor or friend has to bring them food.pt then states " I dont know where my wife is. She is probably at the grocery store" when asked for family location at this time     Hand Dominance   Dominant Hand: Left    Extremity/Trunk Assessment   Upper Extremity Assessment Upper Extremity Assessment: Defer to OT evaluation RUE Deficits / Details: decr extension at MCP, pt with decr muscular mass noted  to hands with clear out lines to underlying anatomy. Pt unabel to fully open hand due to MCP contracture. pt able to make a functional grasp.  LUE Deficits / Details: decr extension at MCP, pt with decr muscular mass noted to hands with clear out lines to underlying anatomy. Pt unabel to fully open hand  due to MCP contracture. pt able to make a functional grasp.  of note brown tint to web space between 1st and 2nd digit question tobacco use    Lower Extremity Assessment Lower Extremity Assessment: Generalized weakness    Cervical / Trunk Assessment Cervical / Trunk Assessment: Kyphotic  Communication   Communication: HOH;Expressive difficulties  Cognition Arousal/Alertness: Awake/alert Behavior During Therapy: Impulsive;Restless;Anxious Overall Cognitive Status: No family/caregiver present to determine baseline cognitive functioning                 General Comments: pt yelling out "HELP" multiple times explaining he can not breath. pt able to complete full story of his SOB without DOE noted . pt with oxygen saturation100% on RA on arrival. Pt educated on saturations and reports "my noses is stopped up. i have to breath through my mouth" pt with nasal spray in the room but not using it currently. Pt fixed on breathing. pt positioned in chair with lunch and eating with nasal cannula 2L on with decr complaints. pt reports "breathing better"     General Comments      Exercises     Assessment/Plan    PT Assessment Patient needs continued PT services  PT Problem List Decreased strength;Decreased activity tolerance;Decreased balance;Decreased mobility;Decreased cognition;Decreased knowledge of precautions;Decreased safety awareness          PT Treatment Interventions DME instruction;Gait training;Functional mobility training;Therapeutic activities;Therapeutic exercise;Balance training;Patient/family education;Cognitive remediation    PT Goals (Current goals can be found in the Care Plan section)  Acute Rehab PT Goals Patient Stated Goal: not stated PT Goal Formulation: With patient Time For Goal Achievement: 04/10/16 Potential to Achieve Goals: Good    Frequency Min 3X/week   Barriers to discharge        Co-evaluation               End of Session Equipment  Utilized During Treatment: Gait belt Activity Tolerance: Patient tolerated treatment well Patient left: in chair;with chair alarm set;with call bell/phone within reach Nurse Communication: Mobility status    Functional Assessment Tool Used: clinical judgement  Functional Limitation: Mobility: Walking and moving around Mobility: Walking and Moving Around Current Status [phone]): At least 20 percent but less than 40 percent impaired, limited or restricted Mobility: Walking and Moving Around Goal Status [phone]): At least 1 percent but less than 20 percent impaired, limited or restricted    Time: 2876-8115 PT Time Calculation (min) (ACUTE ONLY): 21 min   Charges:   PT Evaluation $PT Eval Moderate Complexity: 1 Procedure     PT G Codes:   PT G-Codes **NOT FOR INPATIENT CLASS** Functional Assessment Tool Used: clinical judgement  Functional Limitation: Mobility: Walking and moving around Mobility: Walking and Moving Around Current Status (B2620): At least 20 percent but less than 40 percent impaired, limited or restricted Mobility: Walking and Moving Around Goal Status [phone]): At least 1 percent but less than 20 percent impaired, limited or restricted    Ilda Foil 03/27/2016, 3:49 PM

## 2016-03-27 NOTE — Progress Notes (Signed)
Patient complains of "being stopped up in the nose".  Patient states cannot eat unless nose is "cleaned out".   This nurse examined nose, no noted material in nose. Pulse ox 100% at room air.  Lung sounds clear bilaterally.  Chest expansion symmetrical, no labored breathing noted.  Patient has nasal spray at hand, refusal to let go.  Patient states "it does not work, this is not [employer], this air here is different".  Patient continues to shout out, despite encouragement of the use of call light.

## 2016-03-27 NOTE — Progress Notes (Signed)
Spoke with spouse, Mrs. Petrich on phone. Spouse unable to drive. States they have "electrical problems in the house" regarding heating.  States she uses the [employer] all day and night when able to obtain wood, and uses the woodstove to heat up meals when she is able to "get the neighbors to bring it".  Spouse goes on the say, husband cannot come home as she is sick and unable to care for him herself and that she " has not been taking care of things like I ought to". Spouse encouraged to call if any other concerns or questions. Spouse stated she would.

## 2016-03-27 NOTE — ED Notes (Signed)
Dr. Hobbs at bedside  

## 2016-03-27 NOTE — H&P (Signed)
[facility] History and Physical  Jason ContrasHoward S Franko ZOX:[id number] DOB: Sep 15, 1940 DOA: 03/27/2016  Referring physician:  PCP: Evlyn CourierGerald K Hill, MD   Chief Complaint: "I fell out of bed."  HPI: [name] is a 75 y.o. male  With hx significant for CVA, CAD, CHF, memory problems, HLD, HTN, falls, CKD who presented with fall out of bed. Per patient and wife he's been well. Patient has a history of falls basilic fell that this morning. This is not typical. Patient came to the emergency room for evaluation due to pain. Patient mostly complaining of right hip pain. Patient able to move his right upper extremity without difficulty.  Per patient decreased vision right eye. Can only see light.  ED Course: Pt seen in ED. Dry on EDP eval. Given 500cc NS. Img neg. Hosp consulted for admit.   Review of Systems:  As per HPI otherwise 10 point review of systems negative.    Past Medical History:  Diagnosis Date  . Acute encephalopathy   . AICD (automatic cardioverter/defibrillator) present   . Anemia   . Arthritis    "all over" (05/16/2013)  . Atrial flutter (HCC)   . CAD (coronary artery disease)   . Cardiomyopathy (HCC)    a. 10/2012 Echo: EF 20-25%, diff HK, mod dil LA/RV/RA.  Marland Kitchen. Cervical myelopathy (HCC)   . Cervical spinal stenosis   . Chronic systolic congestive heart failure (HCC)   . CKD (chronic kidney disease), stage III   . 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 Depression    "son died in service in 1992; still bothers me" (05/16/2013)  . DVT (deep venous thrombosis) (HCC) 10/2011   RUE; pt denies this hx on 05/16/2013  . Dysrhythmia   . Frequent falls   . GERD (gastroesophageal reflux disease)   . H/O hiatal hernia   . Hyperlipidemia   . Hypertension   . Hypoglycemia   . Lumbar spinal stenosis   . Mononeuritis of unspecified site   . Myocardial infarction   . Pneumonia 1960   ,  also 2014  . Quadriparesis (HCC) 2013   pre op  . Shortness of breath dyspnea    with exertion  . Type II diabetes mellitus (HCC)    type 2  . Venous insufficiency    Past Surgical History:  Procedure Laterality Date  . ANTERIOR CERVICAL DECOMP/DISCECTOMY FUSION  10/31/2011   Procedure: ANTERIOR CERVICAL DECOMPRESSION/DISCECTOMY FUSION 2 LEVELS;  Surgeon: Cristi LoronJeffrey D Jenkins, MD;  Location: [facility];  Service: Neurosurgery;  Laterality: N/A;  Cervical Four-Five Cervical Five-Six Anterior Cervical Decompression with fusion interbody prothesis plating and bonegraft  . ANTERIOR CERVICAL DECOMP/DISCECTOMY FUSION N/A 04/17/2014   Procedure: CERVICAL THREE TO FOUR ANTERIOR CERVICAL DECOMPRESSION/DISCECTOMY FUSION 1 LEVEL/HARDWARE REMOVAL;  Surgeon: Tressie StalkerJeffrey Jenkins, MD;  Location: [facility];  Service: Neurosurgery;  Laterality: N/A;  C34 anterior cervical decompression with fusion interbody prosthesis plating and bonegraft with exploration of prev fusion and possible removal of old hardware  . BI-VENTRICULAR IMPLANTABLE CARDIOVERTER DEFIBRILLATOR N/A 05/16/2013   Procedure: BI-VENTRICULAR IMPLANTABLE CARDIOVERTER DEFIBRILLATOR  (CRT-D);  Surgeon: Marinus MawGregg W Taylor, MD;  [facility];  Service: Cardiovascular;  Laterality: N/A;  . BI-VENTRICULAR IMPLANTABLE CARDIOVERTER DEFIBRILLATOR  (CRT-D) Left 05/16/2013   STJ CRTD implanted by Dr Ladona Ridgelaylor for primary prevention/CHF  . CARDIAC CATHETERIZATION  10/2012  . CARPAL TUNNEL RELEASE Right ~ 2013  . CATARACT EXTRACTION  W/ INTRAOCULAR LENS IMPLANT Bilateral   . JOINT REPLACEMENT  2013  . LEFT AND RIGHT HEART CATHETERIZATION WITH CORONARY ANGIOGRAM N/A 11/06/2012   Procedure: LEFT AND RIGHT HEART CATHETERIZATION WITH CORONARY ANGIOGRAM;  Surgeon: Laurey Morale, MD;  Location: [facility] C[facility];  Service: Cardiovascular;  Laterality: N/A;  . LUMBAR LAMINECTOMY/DECOMPRESSION MICRODISCECTOMY N/A 02/02/2015   Procedure: LUMBAR LAMINECTOMY/DECOMPRESSION  MICRODISCECTOMY 2 LEVELS;  Surgeon: Tressie Stalker, MD;  Location: [facility];  Service: Neurosurgery;  Laterality: N/A;  L34 L45 laminectomy and foraminotomy  . LUMBAR WOUND DEBRIDEMENT N/A 03/04/2015   Procedure: Incision and Drainage of LUMBAR WOUND ;  Surgeon: Tressie Stalker, MD;  Location: [facility];  Service: Neurosurgery;  Laterality: N/A;  . 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 toe removal  Left    3rd and 4th toe removed  . TOTAL HIP ARTHROPLASTY Right ~ 2012   Social History:  reports that he has never smoked. He has never used smokeless tobacco. He reports that he does not drink alcohol or use drugs.  Allergies  Allergen Reactions  . Acyclovir And Related Other (See Comments)    unknown  . Dristan Cold [Chlorphen-Pe-Acetaminophen] Anxiety    Makes pt feel "wired up"  . Prednisone Other (See Comments)    Messes with blood sugar levels     Family History  Problem Relation Age of Onset  . Diabetes Brother   . Cancer Father     Lung cancer  . Heart attack Mother     pt thinks she had MI  . Cancer Mother      Prior to Admission medications   Medication Sig Start Date End Date Taking? Authorizing Provider  acarbose (PRECOSE) 50 MG tablet Take 1 tablet (50 mg total) by mouth 3 (three) times daily with meals. 12/16/15   Sharon Seller, NP  carvedilol (COREG) 12.5 MG tablet Take 1 tablet (12.5 mg total) by mouth 2 (two) times daily with a meal. 12/16/15   Sharon Seller, NP  CORLANOR 5 MG TABS tablet TAKE ONE TABLET BY MOUTH TWICE DAILY WITH A MEAL 01/27/16   Laurey Morale, MD  docusate sodium (COLACE) 100 MG capsule Take 300 mg by mouth 2 (two) times daily.     Historical Provider, MD  fluticasone (FLONASE) 50 MCG/ACT nasal spray Place 2 sprays into both nostrils daily as needed for allergies. 12/16/15   Sharon Seller, NP  furosemide (LASIX) 40 MG tablet Take 1 tablet (40 mg total) by mouth daily. 12/16/15   Sharon Seller, NP    gabapentin (NEURONTIN) 300 MG capsule Take 1 capsule (300 mg total) by mouth 3 (three) times daily. 12/16/15   Sharon Seller, NP  glimepiride (AMARYL) 2 MG tablet Take 2 mg by mouth 2 (two) times daily.    Historical Provider, MD  hydrALAZINE (APRESOLINE) 25 MG tablet TAKE ONE-HALF TABLET BY MOUTH THREE TIMES DAILY Patient taking differently: Take 12.5 mg by mouth 3 (three) times daily.  12/16/15   Sharon Seller, NP  hydrALAZINE (APRESOLINE) 25 MG tablet TAKE ONE-HALF TABLET BY MOUTH THREE TIMES DAILY 02/04/16   Dolores Patty, MD  HYDROcodone-acetaminophen (NORCO/VICODIN) 5-325 MG tablet Take 2 tablets by mouth every 8 (eight) hours as needed for moderate pain. 11/27/15   Renae Fickle, MD  ipratropium (ATROVENT) 0.06 % nasal spray Place 2 sprays into both nostrils 4 (four) times daily. 01/26/16   Linna Hoff, MD  isosorbide mononitrate (IMDUR)  30 MG 24 hr tablet Take 1 tablet (30 mg total) by mouth daily. 12/16/15   Sharon Seller, NP  ivabradine (CORLANOR) 5 MG TABS tablet Take 1 tablet (5 mg total) by mouth 2 (two) times daily with a meal. 12/16/15   Sharon Seller, NP  magnesium hydroxide (MILK OF MAGNESIA) 400 MG/5ML suspension Take 30 mLs by mouth daily as needed. Patient taking differently: Take 30 mLs by mouth daily as needed for mild constipation or moderate constipation.  12/16/15   Sharon Seller, NP  metFORMIN (GLUCOPHAGE) 1000 MG tablet Take 1,000 mg by mouth 2 (two) times daily with a meal.    Historical Provider, MD  methocarbamol (ROBAXIN) 500 MG tablet Take 500 mg by mouth once.    Historical Provider, MD  Rivaroxaban (XARELTO) 15 MG TABS tablet Take 1 tablet (15 mg total) by mouth daily with supper. 12/16/15   Sharon Seller, NP  rosuvastatin (CRESTOR) 10 MG tablet Take 1 tablet (10 mg total) by mouth daily at 6 PM. 12/16/15   Sharon Seller, NP  saw palmetto 500 MG capsule Take 1,500 mg by mouth 3 (three) times daily.     Historical Provider, MD  Skin  Protectants, Misc. (EUCERIN) cream Apply 1 application topically 2 (two) times daily as needed for dry skin. Apply to shin     Historical Provider, MD  sodium chloride (DEEP SEA NASAL SPRAY) 0.65 % nasal spray Place 2 sprays into the nose every 2 (two) hours as needed for congestion. 12/16/15   Sharon Seller, NP  traZODone (DESYREL) 50 MG tablet Take 1 tablet (50 mg total) by mouth at bedtime as needed for sleep. 12/25/15   Charm Rings, NP   Physical Exam: Vitals:   03/27/16 0207 03/27/16 0218 03/27/16 0627  BP: 166/86  112/65  Pulse: 80  62  Resp: 18  18  Temp: 97.6 F (36.4 C)    TempSrc: Oral    SpO2: 100%  98%  Weight:  74.8 kg (165 lb)   Height:  5\' 8"  (1.727 m)     Wt Readings from Last 3 Encounters:  03/27/16 74.8 kg (165 lb)  12/16/15 82.1 kg (181 lb)  12/11/15 82.1 kg (181 lb)    General:  Appears calm and comfortable; A&Ox1 Eyes:  PRRL, EOMI, normal lids, iris; Minimally reactive right pupil ENT:  grossly normal hearing, lips & tongue Neck:  no LAD, masses or thyromegaly Cardiovascular:  RRR, no m/r/g. No LE edema.  Respiratory:  CTA bilaterally, no w/r/r. Normal respiratory effort. Abdomen:  soft, ntnd Skin:  no rash or induration seen on limited exam Musculoskeletal:  grossly normal tone BUE/BLE; tender lower back with paraspinal spasm Psychiatric:  grossly normal mood and affect, speech fluent and appropriate Neurologic:  CN 2-12 grossly intact, moves all extremities in coordinated fashion.          Labs on Admission:  Basic Metabolic Panel:  Recent Labs Lab 03/27/16 0355  NA 142  K 4.0  CL 107  CO2 24  GLUCOSE 196*  BUN 24*  CREATININE 1.99*  CALCIUM 10.1   Liver Function Tests: No results for input(s): AST, ALT, ALKPHOS, BILITOT, PROT, ALBUMIN in the last 168 hours. No results for input(s): LIPASE, AMYLASE in the last 168 hours. No results for input(s): AMMONIA in the last 168 hours. CBC:  Recent Labs Lab 03/27/16 0355  WBC 8.6    NEUTROABS 5.7  HGB 11.6*  HCT 35.5*  MCV 89.6  PLT  172   Cardiac Enzymes: No results for input(s): CKTOTAL, CKMB, CKMBINDEX, TROPONINI in the last 168 hours.  BNP (last 3 results) No results for input(s): BNP in the last 8760 hours.  ProBNP (last 3 results) No results for input(s): PROBNP in the last 8760 hours.   Serum creatinine: 1.99 mg/dL High 29/02/11 1552 Estimated creatinine clearance: 31 mL/min  CBG:  Recent Labs Lab 03/27/16 0216  GLUCAP 175*    Radiological Exams on Admission: Dg Chest 2 View  Result Date: 03/27/2016 CLINICAL DATA:  Larey Seat on back while getting out of bed tonight EXAM: CHEST  2 VIEW COMPARISON:  None 04/24/2015 FINDINGS: There is stable elevation of the left hemidiaphragm. The lungs are clear. There is no pleural effusion. The pulmonary vasculature is normal. Intact appearances of the transvenous cardiac leads. Hilar and mediastinal contours are unremarkable and unchanged. IMPRESSION: No acute findings Electronically Signed   By: Ellery Plunk M.D.   On: 03/27/2016 03:44   Dg Lumbar Spine Complete  Result Date: 03/27/2016 CLINICAL DATA:  Larey Seat on back while getting out of bed tonight. EXAM: LUMBAR SPINE - COMPLETE 4+ VIEW COMPARISON:  03/03/2015 FINDINGS: The lumbar vertebrae are normal in height. Moderate degenerative lumbar disc disease is present from L4 through the sacrum. There also is facet arthritis at the same levels. No bone lesion or bony destruction. IMPRESSION: Negative for acute lumbar fracture. Electronically Signed   By: Ellery Plunk M.D.   On: 03/27/2016 03:45   Ct Head Wo Contrast  Result Date: 03/27/2016 CLINICAL DATA:  75 year old male with fall. EXAM: CT HEAD WITHOUT CONTRAST CT CERVICAL SPINE WITHOUT CONTRAST TECHNIQUE: Multidetector CT imaging of the head and cervical spine was performed following the standard protocol without intravenous contrast. Multiplanar CT image reconstructions of the cervical spine were also  generated. COMPARISON:  Head CT dated 12/24/2015 FINDINGS: CT HEAD FINDINGS Brain: There has been interval evolution of the previously seen left occipital infarct with encephalomalacia. Otherwise there is mild age-related atrophy and chronic microvascular ischemic changes. There is no acute intracranial hemorrhage. No mass effect or midline shift noted. No extra-axial fluid collection. Vascular: No hyperdense vessel. Skull: Normal. Negative for fracture or focal lesion.There is advanced atherosclerotic calcification of the visualized left vertebral artery. Sinuses/Orbits: No acute finding. Other: None. CT CERVICAL SPINE FINDINGS Alignment: No acute subluxation.  Cervical scoliosis. Skull base and vertebrae: No acute fracture. There is advanced osteopenia which limits evaluation for fracture. Soft tissues and spinal canal: No prevertebral fluid or swelling. No visible canal hematoma. Bilateral carotid bulb atherosclerotic plaques. Disc levels: There is C3-C6 interbody spacer and anterior fusion. There multilevel degenerative changes with disc space narrowing and endplate irregularity. Multilevel facet hypertrophy and disc osteophyte. There is resulting neural foramina narrowing at C3-C4 on the right, C6-C7 bilaterally. Upper chest: Negative. An accessory azygos fissure as well as pacemaker wires noted. Other: None IMPRESSION: No acute intracranial hemorrhage. 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 Advanced atherosclerotic calcification of the left vertebral artery at the foramen magnum. No acute/ traumatic cervical spine pathology. Advanced osteopenia with extensive degenerative changes of the spine and C3-C6 anterior fusion. Electronically Signed   By: Elgie Collard M.D.   On: 03/27/2016 05:39   Ct Cervical Spine Wo Contrast  Result Date: 03/27/2016 CLINICAL DATA:  75 year old male with fall. EXAM: CT HEAD WITHOUT CONTRAST CT CERVICAL SPINE WITHOUT CONTRAST TECHNIQUE: Multidetector CT imaging of  the head and cervical spine was performed following the standard protocol without intravenous contrast. Multiplanar CT image reconstructions  of the cervical spine were also generated. COMPARISON:  Head CT dated 12/24/2015 FINDINGS: CT HEAD FINDINGS Brain: There has been interval evolution of the previously seen left occipital infarct with encephalomalacia. Otherwise there is mild age-related atrophy and chronic microvascular ischemic changes. There is no acute intracranial hemorrhage. No mass effect or midline shift noted. No extra-axial fluid collection. Vascular: No hyperdense vessel. Skull: Normal. Negative for fracture or focal lesion.There is advanced atherosclerotic calcification of the visualized left vertebral artery. Sinuses/Orbits: No acute finding. Other: None. CT CERVICAL SPINE FINDINGS Alignment: No acute subluxation.  Cervical scoliosis. Skull base and vertebrae: No acute fracture. There is advanced osteopenia which limits evaluation for fracture. Soft tissues and spinal canal: No prevertebral fluid or swelling. No visible canal hematoma. Bilateral carotid bulb atherosclerotic plaques. Disc levels: There is C3-C6 interbody spacer and anterior fusion. There multilevel degenerative changes with disc space narrowing and endplate irregularity. Multilevel facet hypertrophy and disc osteophyte. There is resulting neural foramina narrowing at C3-C4 on the right, C6-C7 bilaterally. Upper chest: Negative. An accessory azygos fissure as well as pacemaker wires noted. Other: None IMPRESSION: No acute intracranial hemorrhage. 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 Advanced atherosclerotic calcification of the left vertebral artery at the foramen magnum. No acute/ traumatic cervical spine pathology. Advanced osteopenia with extensive degenerative changes of the spine and C3-C6 anterior fusion. Electronically Signed   By: Elgie Collard M.D.   On: 03/27/2016 05:39   Dg Knee Complete 4 Views  Right  Result Date: 03/27/2016 CLINICAL DATA:  Fall with right knee pain EXAM: RIGHT KNEE - COMPLETE 4+ VIEW COMPARISON:  None. FINDINGS: No evidence of fracture, dislocation, or joint effusion. No evidence of arthropathy or other focal bone abnormality. Soft tissues are unremarkable. There are vascular calcifications. IMPRESSION: No acute osseous injury of the right knee. Electronically Signed   By: Deatra Robinson M.D.   On: 03/27/2016 03:46   Dg Hip Unilat W Or Wo Pelvis 2-3 Views Right  Result Date: 03/27/2016 CLINICAL DATA:  Larey Seat on back while getting out of bed tonight EXAM: DG HIP (WITH OR WITHOUT PELVIS) 2-3V RIGHT COMPARISON:  None. FINDINGS: There are intact appearances of the right total hip arthroplasty hardware. No fracture or dislocation. Pubic symphysis and sacroiliac joints appear intact. IMPRESSION: Negative for acute fracture or dislocation. Electronically Signed   By: Ellery Plunk M.D.   On: 03/27/2016 03:45    EKG: Independently reviewed. Ventricular rate 63, QRS 118, QTC 525, a-B dual paced complexes. No acute ST changes when compared to 9/22  Assessment/Plan Principal Problem:   Acute on chronic renal failure (HCC) Active Problems:   HTN (hypertension)   CAD (coronary artery disease)   Diabetes (HCC)  AKI Baseline Cr 1.48, Cr on admit 1.99 of normal saline given in the emergency room Gentle hydration overnight Checking magnesium and phosphorus Urine labs ordered to calculate fractional excretion of urea  Hypertension When necessary hydralazine 10 mg IV as needed for severe blood pressure Cont hydralazine  DVT Hx Cont Xarelto  Weakness/ Acute Encephalopathy DDx dehydration vs stroke Consult to PT/OT Img shows no new stroke Will get repeat img - CT Hold robaxin, trazodone, neurontin, vicodin  CHF Cont Corlanor, Coreg, Lasix, Imdur  DM Hold Amaryl, Metformin, acarbose SSI  Hyperlipidemia Continue statin   Code Status: FULL  DVT  Prophylaxis: Xarelto Family Communication: Called wife Disposition Plan: Pending Improvement   Status: obs tele  Haydee Salter, MD Family Medicine [facility] [email or web address] Password [email or web address]

## 2016-03-27 NOTE — Evaluation (Signed)
Occupational Therapy Evaluation Patient Details Name: [name] MRN: [id number] DOB: [birth year] Today's Date: 03/27/2016    History of Present Illness 75 yo male s/p fall from bed. 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 pmhhx significant for CVA, CAD, CHF, memory problems, HLD, HTN, falls, CKD    Clinical Impression   PT admitted with s/p fall with stroke workup underway. Pt currently with functional limitiations due to the deficits listed below (see OT problem list). Pt with expressive deficits and HOH. Pt without family present and OT personally attempted to call x3 (various times of day) to reach family.  Pt will benefit from skilled OT to increase their independence and safety with adls and balance to allow discharge SNF. No family present to confirm information from patient. Information provided this session does not directly match information provided previous admission in Aug 2017. Pt has high fall risk and cognitive deficits noted. Pt is not safe to be home alone. If family presents to hospital can complete basic transfer with patient and agreeable then OT will update recommendations at that time.      Follow Up Recommendations  SNF (questions family (A) level )    Equipment Recommendations  None recommended by OT    Recommendations for Other Services       Precautions / Restrictions Precautions Precautions: Fall Precaution Comments: high fall risk      Mobility Bed Mobility               General bed mobility comments: on EOB on arrival   Transfers Overall transfer level: Needs assistance Equipment used: Rolling walker (2 wheeled) Transfers: Sit to/from [employer] Sit to Stand: Min assist Stand pivot transfers: Min assist       General transfer comment: pt requires cues for safety and (A) with position of R. pT atempting to sit on the arm rest of the chair due to poor awareness to location in space    Balance  Overall balance assessment: Needs assistance Sitting-balance support: Bilateral upper extremity supported;Feet supported Sitting balance-Leahy Scale: Fair     Standing balance support: Bilateral upper extremity supported;During functional activity Standing balance-Leahy Scale: Poor                              ADL Overall ADL's : Needs assistance/impaired Eating/Feeding: Set up;Sitting Eating/Feeding Details (indicate cue type and reason): pt cued to eat lunch and finally indicates Grooming: Wash/dry hands;Wash/dry face;Set up;Sitting Grooming Details (indicate cue type and reason): pt noted to have direct removed from hands and face with task.    Upper Body Bathing Details (indicate cue type and reason): hair white in color with a light brown coloring to edges. question frequency of washing hands and baseline hygiene. pt states I dont shower when its cold         Lower Body Dressing: Moderate assistance Lower Body Dressing Details (indicate cue type and reason): pt static standing doff briefs to void in urinal       Toileting - Clothing Manipulation Details (indicate cue type and reason): pt noted to have tea colored urine in clean urinal. question reason of dark color and RN called to help collect sample       General ADL Comments: pt completed transfer to EOB with alarms sounding and pt screaming HELP . once staff arrived pt became less anxious and began following commands. pt apologized for "causing such trouble" Pt much less  anxious sitting in chair vs bed     Vision Additional Comments: pt with R visual field deficits noted. 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 question when vision changed on R eye but pt lacks awareness. pt overshooting for objects and not fully scanning environment on R   Perception     Praxis      Pertinent Vitals/Pain Pain Assessment: No/denies pain     Hand Dominance Left   Extremity/Trunk Assessment Upper  Extremity Assessment Upper Extremity Assessment: Generalized weakness;RUE deficits/detail;LUE deficits/detail RUE Deficits / Details: decr extension at MCP, pt with decr muscular mass noted to hands with clear out lines to underlying anatomy. Pt unabel to fully open hand due to MCP contracture. pt able to make a functional grasp.  LUE Deficits / Details: decr extension at MCP, pt with decr muscular mass noted to hands with clear out lines to underlying anatomy. Pt unabel to fully open hand due to MCP contracture. pt able to make a functional grasp.  of note brown tint to web space between 1st and 2nd digit question tobacco use   Lower Extremity Assessment Lower Extremity Assessment: Defer to PT evaluation   Cervical / Trunk Assessment Cervical / Trunk Assessment: Kyphotic   Communication Communication Communication: HOH;Expressive difficulties   Cognition Arousal/Alertness: Awake/alert Behavior During Therapy: Impulsive;Anxious;Restless Overall Cognitive Status: No family/caregiver present to determine baseline cognitive functioning                 General Comments: pt yelling out "HELP" multiple times explaining he can not breath. pt able to complete full story of his SOB without DOE noted . pt with oxygen saturation100% on RA on arrival. Pt educated on saturations and reports "my noses is stopped up. i have to breath through my mouth" pt with nasal spray in the room but not using it currently. Pt fixed on breathing. pt positioned in chair with lunch and eating with nasal cannula 2L on with decr complaints. pt reports "breathing better"    General Comments       Exercises       Shoulder Instructions      Home Living Family/patient expects to be discharged to:: Private residence Living Arrangements: Spouse/significant other Available Help at Discharge: Family Type of Home: House Home Access: Stairs to enter [employer] of Steps: 2 Entrance Stairs-Rails:  Right Home Layout: Multi-level;Able to live on main level with bedroom/bathroom     Bathroom Shower/Tub: Tub/shower unit;Door Shower/tub characteristics: Door Bathroom Toilet: Handicapped height Bathroom Accessibility: Yes   Home Equipment: Environmental consultant - 4 wheels;Bedside commode;Wheelchair - manual;Hospital bed;Hand held Careers information officer - 2 wheels;Grab bars - tub/shower;Shower seat   Additional Comments: Still have not corroborated the home living with his wife.  Pt unable to remember anything and expressive deficits. called patients home 3 different times.      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 pt reports all his meals have to be bought at resturants for him to have food to eat. pt reports that his neighbor or friend has to bring them food.pt then states " I dont know where my wife is. She is probably at the grocery store" when asked for family location at this time        OT Problem List: Decreased strength;Decreased activity tolerance;Impaired balance (sitting and/or standing);Decreased cognition;Decreased safety awareness;Decreased knowledge of  use of DME or AE;Decreased knowledge of precautions   OT Treatment/Interventions: Self-care/ADL training;Therapeutic exercise;DME and/or AE instruction;Therapeutic activities;Cognitive remediation/compensation;Visual/perceptual remediation/compensation;Patient/family education;Balance training    OT Goals(Current goals can be found in the care plan section) Acute Rehab OT Goals Patient Stated Goal: to breath out my nose OT Goal Formulation: With patient Time For Goal Achievement: 04/10/16 Potential to Achieve Goals: Good  OT Frequency: Min 2X/week   Barriers to D/C:            Co-evaluation              End of Session Equipment Utilized During Treatment: Gait belt;Rolling walker Nurse Communication:  Mobility status;Precautions  Activity Tolerance: Patient tolerated treatment well Patient left: in chair;with call bell/phone within reach;with chair alarm set;with nursing/sitter in room   Time: 1217-1240 OT Time Calculation (min): 23 min Charges:  OT General Charges $OT Visit: 1 Procedure OT Evaluation $OT Eval Moderate Complexity: 1 Procedure G-Codes: OT G-codes **NOT FOR INPATIENT CLASS** Functional Assessment Tool Used: clinical judgemetn Functional Limitation: Self care Self Care Current Status (B2841(G8987): At least 40 percent but less than 60 percent impaired, limited or restricted Self Care Goal Status (L2440(G8988): At least 20 percent but less than 40 percent impaired, limited or restricted  Boone MasterJones, Averey Trompeter B 03/27/2016, 1:35 PM  Mateo FlowJones, Brynn   OTR/L Pager: [phone] Office: [phone] .

## 2016-03-27 NOTE — Progress Notes (Signed)
EKG done, strips in chart.  MD notified via amion text.

## 2016-03-27 NOTE — Progress Notes (Signed)
Pt keeps on removing telemetry; very irritable and agitated. Pt keeps on decline telemetry; pt reeducated on the importance of telemetry but he still refused and removed it when put back on him. MD aware; will continue to monitor closely. Dionne Bucy RN

## 2016-03-27 NOTE — ED Provider Notes (Signed)
[facility]-EMERGENCY DEPT Provider Note   CSN: [id number] Arrival date & time: 03/27/16  0207   By signing my name below, I, Bobbie Stack, attest that this documentation has been prepared under the direction and in the presence of Glynn Octave, MD. Electronically Signed: Bobbie Stack, Scribe. 03/27/16. 2:36 AM. History   Chief Complaint Chief Complaint  Patient presents with  . Fall    The history is provided by the patient. No language interpreter was used.    HPI Comments: [name] is a 75 y.o. male brought in by ambulance, who presents to the Emergency Department complaining of moderate lower back pain s/p fall that occurred PTA. Patient reports that he was trying to get up out of bed tonight when he fell and landed on his back. He denies hitting his head. He denies bilateral leg weakness. Patient is unsure whether he had LOC. He reports associated shoulder pain, right hip pain, and mild headache that has improved. Denies CP, abdominal pain, and SOB. He denies any past hip surgeries. He lives at home with his wife. Pt is currently on Xarelto. Pt ambulates with a cane, sometimes uses a walker.   Past Medical History:  Diagnosis Date  . Acute encephalopathy   . AICD (automatic cardioverter/defibrillator) present   . Anemia   . Arthritis    "all over" (05/16/2013)  . Atrial flutter (HCC)   . CAD (coronary artery disease)   . Cardiomyopathy (HCC)    a. 10/2012 Echo: EF 20-25%, diff HK, mod dil LA/RV/RA.  Marland Kitchen Cervical myelopathy (HCC)   . Cervical spinal stenosis   . Chronic systolic congestive heart failure (HCC)   . CKD (chronic kidney disease), stage III   . 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 Depression    "son died in service in 09-05-1990; still bothers me" (05/16/2013)  . DVT (deep venous thrombosis) (HCC) 10/2011   RUE; pt denies this hx on 05/16/2013  . Dysrhythmia   . Frequent falls    . GERD (gastroesophageal reflux disease)   . H/O hiatal hernia   . Hyperlipidemia   . Hypertension   . Hypoglycemia   . Lumbar spinal stenosis   . Mononeuritis of unspecified site   . Myocardial infarction   . Pneumonia 1960   , also 2014  . Quadriparesis (HCC) 2011-09-05   pre op  . Shortness of breath dyspnea    with exertion  . Type II diabetes mellitus (HCC)    type 2  . Venous insufficiency     Patient Active Problem List   Diagnosis Date Noted  . Adjustment disorder with depressed mood 12/25/2015  . Memory deficits 12/01/2015  . Encephalopathy, metabolic 11/28/2015  . Cellulitis of second toe of left foot   . Acute left PCA stroke (HCC) 11/25/2015  . Wound dehiscence 03/03/2015  . Lumbar stenosis with neurogenic claudication 02/02/2015  . Cervical spondylosis with myelopathy 04/17/2014  . ICD (implantable cardioverter-defibrillator), biventricular, in situ 11/19/2013  . Cardiac resynchronization therapy defibrillator (CRT-D) in place 07/03/2013  . Chronic renal failure 01/24/2013  . CAD (coronary artery disease) 12/10/2012  . Chronic systolic heart failure (HCC) 11/20/2012  . Cardiomyopathy, ischemic 11/20/2012  . Atrial flutter (HCC) 11/08/2012  . Dilated cardiomyopathy (HCC) 11/01/2012  . Hypoglycemia 10/29/2012  . Community acquired pneumonia 10/29/2012  . Acute encephalopathy 10/29/2012  . Cervical stenosis of spine 10/28/2011  . Spondylosis of cervical joint 10/28/2011  .  Cervical myelopathy (HCC) 10/28/2011  . Quadriparesis (HCC) 10/28/2011  . DVT of right proximal brachial through the distal axillary vein, acute 10/06/2011  . Constipation due to pain medication 10/06/2011  . Venous insufficiency 10/05/2011  . 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 Hyperlipidemia 10/05/2011  . Normocytic anemia 10/03/2011  . Falls frequently 10/03/2011  . Diabetes mellitus with chronic kidney  disease (HCC) 10/03/2011  . HTN (hypertension) 10/03/2011  . Gait abnormality secondary to lumbar spinal stenosis 10/03/2011  . Arthritis 10/03/2011    Past Surgical History:  Procedure Laterality Date  . ANTERIOR CERVICAL DECOMP/DISCECTOMY FUSION  10/31/2011   Procedure: ANTERIOR CERVICAL DECOMPRESSION/DISCECTOMY FUSION 2 LEVELS;  Surgeon: Cristi Loron, MD;  Location: [facility];  Service: Neurosurgery;  Laterality: N/A;  Cervical Four-Five Cervical Five-Six Anterior Cervical Decompression with fusion interbody prothesis plating and bonegraft  . ANTERIOR CERVICAL DECOMP/DISCECTOMY FUSION N/A 04/17/2014   Procedure: CERVICAL THREE TO FOUR ANTERIOR CERVICAL DECOMPRESSION/DISCECTOMY FUSION 1 LEVEL/HARDWARE REMOVAL;  Surgeon: Tressie Stalker, MD;  Location: [facility];  Service: Neurosurgery;  Laterality: N/A;  C34 anterior cervical decompression with fusion interbody prosthesis plating and bonegraft with exploration of prev fusion and possible removal of old hardware  . BI-VENTRICULAR IMPLANTABLE CARDIOVERTER DEFIBRILLATOR N/A 05/16/2013   Procedure: BI-VENTRICULAR IMPLANTABLE CARDIOVERTER DEFIBRILLATOR  (CRT-D);  Surgeon: Marinus Maw, MD;  Location: [facility] CATH LAB;  Service: Cardiovascular;  Laterality: N/A;  . BI-VENTRICULAR IMPLANTABLE CARDIOVERTER DEFIBRILLATOR  (CRT-D) Left 05/16/2013   STJ CRTD implanted by Dr Ladona Ridgel for primary prevention/CHF  . CARDIAC CATHETERIZATION  10/2012  . CARPAL TUNNEL RELEASE Right ~ 2013  . CATARACT EXTRACTION W/ INTRAOCULAR LENS IMPLANT Bilateral   . JOINT REPLACEMENT  2013  . LEFT AND RIGHT HEART CATHETERIZATION WITH CORONARY ANGIOGRAM N/A 11/06/2012   Procedure: LEFT AND RIGHT HEART CATHETERIZATION WITH CORONARY ANGIOGRAM;  Surgeon: Laurey Morale, MD;  Location: [facility] CATH LAB;  Service: Cardiovascular;  Laterality: N/A;  . LUMBAR LAMINECTOMY/DECOMPRESSION MICRODISCECTOMY N/A 02/02/2015   Procedure: LUMBAR LAMINECTOMY/DECOMPRESSION MICRODISCECTOMY 2 LEVELS;   Surgeon: Tressie Stalker, MD;  Location: [facility];  Service: Neurosurgery;  Laterality: N/A;  L34 L45 laminectomy and foraminotomy  . LUMBAR WOUND DEBRIDEMENT N/A 03/04/2015   Procedure: Incision and Drainage of LUMBAR WOUND ;  Surgeon: Tressie Stalker, MD;  Location: [facility];  Service: Neurosurgery;  Laterality: N/A;  . 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 toe removal  Left    3rd and 4th toe removed  . TOTAL HIP ARTHROPLASTY Right ~ 2012       Home Medications    Prior to Admission medications   Medication Sig Start Date End Date Taking? Authorizing Provider  acarbose (PRECOSE) 50 MG tablet Take 1 tablet (50 mg total) by mouth 3 (three) times daily with meals. 12/16/15   Sharon Seller, NP  carvedilol (COREG) 12.5 MG tablet Take 1 tablet (12.5 mg total) by mouth 2 (two) times daily with a meal. 12/16/15   Sharon Seller, NP  CORLANOR 5 MG TABS tablet TAKE ONE TABLET BY MOUTH TWICE DAILY WITH A MEAL 01/27/16   Laurey Morale, MD  docusate sodium (COLACE) 100 MG capsule Take 300 mg by mouth 2 (two) times daily.     Historical Provider, MD  fluticasone (FLONASE) 50 MCG/ACT nasal spray Place 2 sprays into both nostrils daily as needed for allergies. 12/16/15   Sharon Seller, NP  furosemide (LASIX) 40 MG tablet Take 1 tablet (40 mg total) by mouth daily. 12/16/15   Sharon SellerJessica K Eubanks, NP  gabapentin (NEURONTIN) 300 MG capsule Take 1 capsule (300 mg total) by mouth 3 (three) times daily. 12/16/15   Sharon SellerJessica K Eubanks, NP  glimepiride (AMARYL) 2 MG tablet Take 2 mg by mouth 2 (two) times daily.    Historical Provider, MD  hydrALAZINE (APRESOLINE) 25 MG tablet TAKE ONE-HALF TABLET BY MOUTH THREE TIMES DAILY Patient taking differently: Take 12.5 mg by mouth 3 (three) times daily.  12/16/15   Sharon SellerJessica K Eubanks, NP  hydrALAZINE (APRESOLINE) 25 MG tablet TAKE ONE-HALF TABLET BY MOUTH THREE TIMES DAILY 02/04/16   Dolores Pattyaniel R Bensimhon, MD  HYDROcodone-acetaminophen  (NORCO/VICODIN) 5-325 MG tablet Take 2 tablets by mouth every 8 (eight) hours as needed for moderate pain. 11/27/15   Renae FickleMackenzie Short, MD  ipratropium (ATROVENT) 0.06 % nasal spray Place 2 sprays into both nostrils 4 (four) times daily. 01/26/16   Linna HoffJames D Kindl, MD  isosorbide mononitrate (IMDUR) 30 MG 24 hr tablet Take 1 tablet (30 mg total) by mouth daily. 12/16/15   Sharon SellerJessica K Eubanks, NP  ivabradine (CORLANOR) 5 MG TABS tablet Take 1 tablet (5 mg total) by mouth 2 (two) times daily with a meal. 12/16/15   Sharon SellerJessica K Eubanks, NP  magnesium hydroxide (MILK OF MAGNESIA) 400 MG/5ML suspension Take 30 mLs by mouth daily as needed. Patient taking differently: Take 30 mLs by mouth daily as needed for mild constipation or moderate constipation.  12/16/15   Sharon SellerJessica K Eubanks, NP  metFORMIN (GLUCOPHAGE) 1000 MG tablet Take 1,000 mg by mouth 2 (two) times daily with a meal.    Historical Provider, MD  methocarbamol (ROBAXIN) 500 MG tablet Take 500 mg by mouth once.    Historical Provider, MD  Rivaroxaban (XARELTO) 15 MG TABS tablet Take 1 tablet (15 mg total) by mouth daily with supper. 12/16/15   Sharon SellerJessica K Eubanks, NP  rosuvastatin (CRESTOR) 10 MG tablet Take 1 tablet (10 mg total) by mouth daily at 6 PM. 12/16/15   Sharon SellerJessica K Eubanks, NP  saw palmetto 500 MG capsule Take 1,500 mg by mouth 3 (three) times daily.     Historical Provider, MD  Skin Protectants, Misc. (EUCERIN) cream Apply 1 application topically 2 (two) times daily as needed for dry skin. Apply to shin     Historical Provider, MD  sodium chloride (DEEP SEA NASAL SPRAY) 0.65 % nasal spray Place 2 sprays into the nose every 2 (two) hours as needed for congestion. 12/16/15   Sharon SellerJessica K Eubanks, NP  traZODone (DESYREL) 50 MG tablet Take 1 tablet (50 mg total) by mouth at bedtime as needed for sleep. 12/25/15   Charm RingsJamison Y Lord, NP    Family History Family History  Problem Relation Age of Onset  . Diabetes Brother   . Cancer Father     Lung cancer  .  Heart attack Mother     pt thinks she had MI  . Cancer Mother     Social History Social History  Substance Use Topics  . Smoking status: Never Smoker  . Smokeless tobacco: Never Used  . Alcohol use No     Comment: 05/16/2013 "used to drink a little bit a long time ago; nothing in over 30 yrs"     Allergies   Acyclovir and related; Dristan cold [chlorphen-pe-acetaminophen]; and Prednisone   Review of Systems Review of Systems A complete 10 system review of systems was obtained  and all systems are negative except as noted in the HPI and PMH.   Physical Exam Updated Vital Signs BP 166/86 (BP Location: Right Arm)   Pulse 80   Temp 97.6 F (36.4 C) (Oral)   Resp 18   Ht 5\' 8"  (1.727 m)   Wt 165 lb (74.8 kg)   SpO2 100%   BMI 25.09 kg/m   Physical Exam  Constitutional: He is oriented to person, place, and time. No distress.  Disheveled. Smells of tobacco.   HENT:  Head: Normocephalic and atraumatic.  Mouth/Throat: Oropharynx is clear and moist. No oropharyngeal exudate.  Eyes: Conjunctivae and EOM are normal. Pupils are equal, round, and reactive to light.  Neck: Normal range of motion. Neck supple.  No meningismus.  Cardiovascular: Normal rate, regular rhythm, normal heart sounds and intact distal pulses.   No murmur heard. Pulmonary/Chest: Effort normal and breath sounds normal. No respiratory distress.  Abdominal: Soft. There is no tenderness. There is no rebound and no guarding.  Musculoskeletal: Normal range of motion. He exhibits no edema or tenderness.  Diffuse C-spine tenderness and para spinal lumbar tenderness. Pain with ROM of right hip and knee.  Neurological: He is alert and oriented to person, place, and time. No cranial nerve deficit. He exhibits normal muscle tone. Coordination normal.   5/5 strength throughout. CN 2-12 intact.Equal grip strength.   Skin: Skin is warm.  Psychiatric: He has a normal mood and affect. His behavior is normal.  Nursing note  and vitals reviewed.    ED Treatments / Results  DIAGNOSTIC STUDIES: Oxygen Saturation is 100% on RA, normal by my interpretation.    COORDINATION OF CARE: 2:31 AM Discussed treatment plan with pt at bedside and pt agreed to plan.  Labs (all labs ordered are listed, but only abnormal results are displayed) Labs Reviewed  CBG MONITORING, ED - Abnormal; Notable for the following:       Result Value   Glucose-Capillary 175 (*)    All other components within normal limits    EKG  EKG Interpretation None       Radiology No results found.  Procedures Procedures (including critical care time)  Medications Ordered in ED Medications - No data to display   Initial Impression / Assessment and Plan / ED Course  I have reviewed the triage vital signs and the nursing notes.  Pertinent labs & imaging results that were available during my care of the patient were reviewed by me and considered in my medical decision making (see chart for details).  Clinical Course   Rolled out of bed. Denies LOC.  C/o R hip and low back pain. On xarelto.  Disheveled.  Neuro intact. Pain with ROM R hip. CT head will be obtained given xarelto use and questionable head trauma.  EKG paced. Labs with AKI.  Will hydrate. Traumatic imaging negative.  Patient unable to ambulate with assistance, very unsteady on feet. New stroke considered, unable to get MRI due to AICD.  Patient appears unsafe to go home. Clinically dry and significant fall risk. Concerned he is unable to take care of himself. He lives with his wife who is also elderly and "sick".   Observation admission d/w Dr. Melynda Ripple.  Final Clinical Impressions(s) / ED Diagnoses   Final diagnoses:  Unsteady gait  Acute kidney injury [facility])    New Prescriptions New Prescriptions   No medications on file   I personally performed the services described in this documentation, which was scribed in my  presence. The recorded information has been  reviewed and is accurate.   Glynn Octave, MD 03/27/16 [phone]

## 2016-03-28 DIAGNOSIS — N183 Chronic kidney disease, stage 3 unspecified: Secondary | ICD-10-CM | POA: Diagnosis present

## 2016-03-28 DIAGNOSIS — N179 Acute kidney failure, unspecified: Secondary | ICD-10-CM | POA: Diagnosis not present

## 2016-03-28 LAB — LIPID PANEL
CHOLESTEROL: 123 mg/dL (ref 0–200)
HDL: 22 mg/dL — ABNORMAL LOW (ref >40)
LDL Cholesterol: 77 mg/dL (ref 0–99)
Total CHOL/HDL Ratio: 5.6 RATIO
Triglycerides: 121 mg/dL (ref <150)
VLDL: 24 mg/dL (ref 0–40)

## 2016-03-28 LAB — CBC
HCT: 32.2 % — ABNORMAL LOW (ref 39.0–52.0)
Hemoglobin: 10.4 g/dL — ABNORMAL LOW (ref 13.0–17.0)
MCH: 28.9 pg (ref 26.0–34.0)
MCHC: 32.3 g/dL (ref 30.0–36.0)
MCV: 89.4 fL (ref 78.0–100.0)
PLATELETS: 148 10*3/uL — AB (ref 150–400)
RBC: 3.6 MIL/uL — AB (ref 4.22–5.81)
RDW: 15.3 % (ref 11.5–15.5)
WBC: 5.4 10*3/uL (ref 4.0–10.5)

## 2016-03-28 LAB — GLUCOSE, CAPILLARY
GLUCOSE-CAPILLARY: 184 mg/dL — AB (ref 65–99)
GLUCOSE-CAPILLARY: 98 mg/dL (ref 65–99)
Glucose-Capillary: 244 mg/dL — ABNORMAL HIGH (ref 65–99)
Glucose-Capillary: 270 mg/dL — ABNORMAL HIGH (ref 65–99)

## 2016-03-28 LAB — BASIC METABOLIC PANEL
Anion gap: 6 (ref 5–15)
BUN: 25 mg/dL — ABNORMAL HIGH (ref 6–20)
CALCIUM: 9.4 mg/dL (ref 8.9–10.3)
CHLORIDE: 109 mmol/L (ref 101–111)
CO2: 26 mmol/L (ref 22–32)
CREATININE: 1.69 mg/dL — AB (ref 0.61–1.24)
GFR calc non Af Amer: 38 mL/min — ABNORMAL LOW (ref >60)
GFR, EST AFRICAN AMERICAN: 44 mL/min — AB (ref >60)
Glucose, Bld: 202 mg/dL — ABNORMAL HIGH (ref 65–99)
Potassium: 3.7 mmol/L (ref 3.5–5.1)
SODIUM: 141 mmol/L (ref 135–145)

## 2016-03-28 LAB — UREA NITROGEN, URINE: Urea Nitrogen, Ur: 635 mg/dL

## 2016-03-28 MED ORDER — PSEUDOEPHEDRINE HCL ER 120 MG PO TB12
120.0000 mg | ORAL_TABLET | Freq: Two times a day (BID) | ORAL | Status: DC
  Administered 2016-03-28 – 2016-03-29 (×2): 120 mg via ORAL
  Filled 2016-03-28 (×3): qty 1

## 2016-03-28 MED ORDER — HALOPERIDOL LACTATE 5 MG/ML IJ SOLN
2.0000 mg | Freq: Four times a day (QID) | INTRAMUSCULAR | Status: DC | PRN
  Administered 2016-03-28: 2 mg via INTRAMUSCULAR
  Filled 2016-03-28 (×2): qty 1

## 2016-03-28 MED ORDER — SODIUM CHLORIDE 0.9 % IV SOLN: INTRAVENOUS | Status: DC

## 2016-03-28 MED ORDER — CARVEDILOL 12.5 MG PO TABS
12.5000 mg | ORAL_TABLET | Freq: Two times a day (BID) | ORAL | Status: DC
  Administered 2016-03-28 – 2016-03-29 (×4): 12.5 mg via ORAL
  Filled 2016-03-28 (×4): qty 1

## 2016-03-28 MED ORDER — RIVAROXABAN 15 MG PO TABS
15.0000 mg | ORAL_TABLET | Freq: Every day | ORAL | Status: DC
  Administered 2016-03-28 – 2016-03-29 (×2): 15 mg via ORAL
  Filled 2016-03-28 (×2): qty 1

## 2016-03-28 MED ORDER — 4-WAY SALINE NA SOLN: NASAL | Status: DC

## 2016-03-28 MED ORDER — PHENYLEPHRINE HCL 0.25 % NA SOLN
1.0000 | Freq: Four times a day (QID) | NASAL | Status: DC | PRN
  Filled 2016-03-28: qty 15

## 2016-03-28 MED ORDER — PHENYLEPHRINE HCL 0.5 % NA SOLN
1.0000 [drp] | Freq: Four times a day (QID) | NASAL | Status: DC | PRN
  Filled 2016-03-28 (×2): qty 15

## 2016-03-28 MED ORDER — HALOPERIDOL LACTATE 5 MG/ML IJ SOLN
5.0000 mg | Freq: Four times a day (QID) | INTRAMUSCULAR | Status: DC | PRN
  Administered 2016-03-28: 5 mg via INTRAMUSCULAR
  Filled 2016-03-28: qty 1

## 2016-03-28 MED ORDER — OXYMETAZOLINE HCL 0.05 % NA SOLN
1.0000 | Freq: Two times a day (BID) | NASAL | Status: DC
  Administered 2016-03-28 – 2016-03-29 (×3): 1 via NASAL
  Filled 2016-03-28: qty 15

## 2016-03-28 MED ORDER — QUETIAPINE FUMARATE 50 MG PO TABS
50.0000 mg | ORAL_TABLET | Freq: Every day | ORAL | Status: DC
  Administered 2016-03-28: 50 mg via ORAL
  Filled 2016-03-28: qty 1

## 2016-03-28 MED ORDER — FLUTICASONE PROPIONATE 50 MCG/ACT NA SUSP
2.0000 | Freq: Two times a day (BID) | NASAL | Status: DC
  Administered 2016-03-28 – 2016-03-29 (×3): 2 via NASAL
  Filled 2016-03-28: qty 16

## 2016-03-28 MED ORDER — SALINE SPRAY 0.65 % NA SOLN
1.0000 | NASAL | Status: DC
  Administered 2016-03-28 – 2016-03-29 (×29): 1 via NASAL
  Filled 2016-03-28: qty 44

## 2016-03-28 NOTE — Progress Notes (Signed)
Pt is not following requests to stay in bed and to call or use call light to request help, he is not able to retain information regarding safety and is not able to remember details about his current condition, continues to ask for wife and ask why he is here and where is he. Moved patient closer to nurses station for safety concerns. Will continue to monitor.

## 2016-03-28 NOTE — Progress Notes (Addendum)
PROGRESS NOTE    [name]  ZOX:[id number] DOB: 1941-04-04 DOA: 03/27/2016 PCP: [name], MD     Brief Narrative:  [name] is a 75 y.o. male with hx significant for CVA, CAD, CHF, memory problems, HLD, HTN, falls, CKD who presented with fall out of bed. Patient admitted for AKI.    Assessment & Plan:   Principal Problem:   AKI (acute kidney injury) (HCC) Active Problems:   HTN (hypertension)   Acute encephalopathy   CAD (coronary artery disease)   Diabetes (HCC)   CKD (chronic kidney disease) stage 3, GFR 30-59 ml/min  AKI on CKD stage 3  -Baseline Cr 1.4 -UA unremarkable for infectious process  -Improving with IVF, trend BMP   Weakness and fall  -Right hip xray, right knee xray, lumbar xray negative  -CT head negative for acute hemorrhage, has old CVA and encephalomalacia  -PT/OT. Recommend SNF   Dementia with behavioral disturbance  -Per wife, he has always been very difficult to deal with and very demanding. Since his stroke in 2015, his behavior has gotten worse, is always confused, verbally abusive, threatening toward her, throwing things her her at home. He also hallucinates at home as well. Sounds like his current behavior at the hospital is his baseline -Haldol prn for agitation and seroquel qhs    Hypertension -Apparently has not been compliant with meds at home, wife not giving meds  -Restart coreg   DVT Hx -Continue Xarelto  DM -Hold Amaryl, Metformin, acarbose -Ha1c pending  -SSI  Hyperlipidemia -Continue crestor    DVT prophylaxis: Xarelto Code Status: Full Family Communication: spoke with wife over the phone  Disposition Plan: Will require SNF on discharge. There are reports of care issues at home. Wife is no longer able to care for him, give meds, etc.     Consultants:   None  Procedures:   None  Antimicrobials:   None    Subjective: Patient states "I don't know who I am or where I am." He has no  other complaints aside from being cold. Denies any chest pain, nausea, vomiting. He is a very poor historian and does not know why he's in the hospital. See nursing notes about behavioral issues.    Objective: Vitals:   03/27/16 2203 03/28/16 0115 03/28/16 0530 03/28/16 0556  BP: 127/86 (!) 118/51 (!) 147/81   Pulse: 62 68 100   Resp: 18 20 20    Temp: 97.9 F (36.6 C) 98.3 F (36.8 C) 97.5 F (36.4 C)   TempSrc: Oral Axillary Axillary   SpO2: 99%     Weight:    74.8 kg (164 lb 14.4 oz)  Height:        Intake/Output Summary (Last 24 hours) at 03/28/16 0807 Last data filed at 03/28/16 0600  Gross per 24 hour  Intake              980 ml  Output              600 ml  Net              380 ml   Filed Weights   03/27/16 0218 03/27/16 0938 03/28/16 0556  Weight: 74.8 kg (165 lb) 72.8 kg (160 lb 9.6 oz) 74.8 kg (164 lb 14.4 oz)    Examination:  General exam: Appears calm and comfortable  Respiratory system: Clear to auscultation. Respiratory effort normal. Cardiovascular system: S1 & S2 heard, RRR. No JVD, murmurs, rubs, gallops or clicks.  No pedal edema. Gastrointestinal system: Abdomen is nondistended, soft and nontender. No organomegaly or masses felt. Normal bowel sounds heard. Central nervous system: Alert but not oriented to self, place, time. No focal neurological deficits. Extremities: Symmetric 5 x 5 power. Skin: No rashes, lesions or ulcers Psychiatry: Confused   Data Reviewed: I have personally reviewed following labs and imaging studies  CBC:  Recent Labs Lab 03/27/16 0355 03/28/16 0532  WBC 8.6 5.4  NEUTROABS 5.7  --   HGB 11.6* 10.4*  HCT 35.5* 32.2*  MCV 89.6 89.4  PLT 172 148*   Basic Metabolic Panel:  Recent Labs Lab 03/27/16 0355 03/27/16 0828 03/28/16 0532  NA 142  --  141  K 4.0  --  3.7  CL 107  --  109  CO2 24  --  26  GLUCOSE 196*  --  202*  BUN 24*  --  25*  CREATININE 1.99*  --  1.69*  CALCIUM 10.1  --  9.4  MG  --  2.0  --   PHOS   --  3.3  --    GFR: Estimated Creatinine Clearance: 40 mL/min (by C-G formula based on SCr of 1.69 mg/dL (H)). Liver Function Tests: No results for input(s): AST, ALT, ALKPHOS, BILITOT, PROT, ALBUMIN in the last 168 hours. No results for input(s): LIPASE, AMYLASE in the last 168 hours. No results for input(s): AMMONIA in the last 168 hours. Coagulation Profile: No results for input(s): INR, PROTIME in the last 168 hours. Cardiac Enzymes: No results for input(s): CKTOTAL, CKMB, CKMBINDEX, TROPONINI in the last 168 hours. BNP (last 3 results) No results for input(s): PROBNP in the last 8760 hours. HbA1C: No results for input(s): HGBA1C in the last 72 hours. CBG:  Recent Labs Lab 03/27/16 0216 03/27/16 1129 03/27/16 1629 03/27/16 2128 03/28/16 0613  GLUCAP 175* 225* 138* 197* 184*   Lipid Profile:  Recent Labs  03/28/16 0532  CHOL 123  HDL 22*  LDLCALC 77  TRIG 782  CHOLHDL 5.6   Thyroid Function Tests:  Recent Labs  03/27/16 0858  TSH 0.946   Anemia Panel: No results for input(s): VITAMINB12, FOLATE, FERRITIN, TIBC, IRON, RETICCTPCT in the last 72 hours. Sepsis Labs: No results for input(s): PROCALCITON, LATICACIDVEN in the last 168 hours.  No results found for this or any previous visit (from the past 240 hour(s)).     Radiology Studies: Dg Chest 2 View  Result Date: 03/27/2016 CLINICAL DATA:  Larey Seat on back while getting out of bed tonight EXAM: CHEST  2 VIEW COMPARISON:  None 04/24/2015 FINDINGS: There is stable elevation of the left hemidiaphragm. The lungs are clear. There is no pleural effusion. The pulmonary vasculature is normal. Intact appearances of the transvenous cardiac leads. Hilar and mediastinal contours are unremarkable and unchanged. IMPRESSION: No acute findings Electronically Signed   By: Ellery Plunk M.D.   On: 03/27/2016 03:44   Dg Lumbar Spine Complete  Result Date: 03/27/2016 CLINICAL DATA:  Larey Seat on back while getting out of bed  tonight. EXAM: LUMBAR SPINE - COMPLETE 4+ VIEW COMPARISON:  03/03/2015 FINDINGS: The lumbar vertebrae are normal in height. Moderate degenerative lumbar disc disease is present from L4 through the sacrum. There also is facet arthritis at the same levels. No bone lesion or bony destruction. IMPRESSION: Negative for acute lumbar fracture. Electronically Signed   By: Ellery Plunk M.D.   On: 03/27/2016 03:45   Ct Head Wo Contrast  Result Date: 03/27/2016 CLINICAL DATA:  75 year old male  with fall. EXAM: CT HEAD WITHOUT CONTRAST CT CERVICAL SPINE WITHOUT CONTRAST TECHNIQUE: Multidetector CT imaging of the head and cervical spine was performed following the standard protocol without intravenous contrast. Multiplanar CT image reconstructions of the cervical spine were also generated. COMPARISON:  Head CT dated 12/24/2015 FINDINGS: CT HEAD FINDINGS Brain: There has been interval evolution of the previously seen left occipital infarct with encephalomalacia. Otherwise there is mild age-related atrophy and chronic microvascular ischemic changes. There is no acute intracranial hemorrhage. No mass effect or midline shift noted. No extra-axial fluid collection. Vascular: No hyperdense vessel. Skull: Normal. Negative for fracture or focal lesion.There is advanced atherosclerotic calcification of the visualized left vertebral artery. Sinuses/Orbits: No acute finding. Other: None. CT CERVICAL SPINE FINDINGS Alignment: No acute subluxation.  Cervical scoliosis. Skull base and vertebrae: No acute fracture. There is advanced osteopenia which limits evaluation for fracture. Soft tissues and spinal canal: No prevertebral fluid or swelling. No visible canal hematoma. Bilateral carotid bulb atherosclerotic plaques. Disc levels: There is C3-C6 interbody spacer and anterior fusion. There multilevel degenerative changes with disc space narrowing and endplate irregularity. Multilevel facet hypertrophy and disc osteophyte. There is  resulting neural foramina narrowing at C3-C4 on the right, C6-C7 bilaterally. Upper chest: Negative. An accessory azygos fissure as well as pacemaker wires noted. Other: None IMPRESSION: No acute intracranial hemorrhage. 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 Advanced atherosclerotic calcification of the left vertebral artery at the foramen magnum. No acute/ traumatic cervical spine pathology. Advanced osteopenia with extensive degenerative changes of the spine and C3-C6 anterior fusion. Electronically Signed   By: Elgie CollardArash  Radparvar M.D.   On: 03/27/2016 05:39   Ct Cervical Spine Wo Contrast  Result Date: 03/27/2016 CLINICAL DATA:  75 year old male with fall. EXAM: CT HEAD WITHOUT CONTRAST CT CERVICAL SPINE WITHOUT CONTRAST TECHNIQUE: Multidetector CT imaging of the head and cervical spine was performed following the standard protocol without intravenous contrast. Multiplanar CT image reconstructions of the cervical spine were also generated. COMPARISON:  Head CT dated 12/24/2015 FINDINGS: CT HEAD FINDINGS Brain: There has been interval evolution of the previously seen left occipital infarct with encephalomalacia. Otherwise there is mild age-related atrophy and chronic microvascular ischemic changes. There is no acute intracranial hemorrhage. No mass effect or midline shift noted. No extra-axial fluid collection. Vascular: No hyperdense vessel. Skull: Normal. Negative for fracture or focal lesion.There is advanced atherosclerotic calcification of the visualized left vertebral artery. Sinuses/Orbits: No acute finding. Other: None. CT CERVICAL SPINE FINDINGS Alignment: No acute subluxation.  Cervical scoliosis. Skull base and vertebrae: No acute fracture. There is advanced osteopenia which limits evaluation for fracture. Soft tissues and spinal canal: No prevertebral fluid or swelling. No visible canal hematoma. Bilateral carotid bulb atherosclerotic plaques. Disc levels: There is C3-C6 interbody spacer  and anterior fusion. There multilevel degenerative changes with disc space narrowing and endplate irregularity. Multilevel facet hypertrophy and disc osteophyte. There is resulting neural foramina narrowing at C3-C4 on the right, C6-C7 bilaterally. Upper chest: Negative. An accessory azygos fissure as well as pacemaker wires noted. Other: None IMPRESSION: No acute intracranial hemorrhage. 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 Advanced atherosclerotic calcification of the left vertebral artery at the foramen magnum. No acute/ traumatic cervical spine pathology. Advanced osteopenia with extensive degenerative changes of the spine and C3-C6 anterior fusion. Electronically Signed   By: Elgie CollardArash  Radparvar M.D.   On: 03/27/2016 05:39   Dg Knee Complete 4 Views Right  Result Date: 03/27/2016 CLINICAL DATA:  Fall with right knee pain EXAM: RIGHT  KNEE - COMPLETE 4+ VIEW COMPARISON:  None. FINDINGS: No evidence of fracture, dislocation, or joint effusion. No evidence of arthropathy or other focal bone abnormality. Soft tissues are unremarkable. There are vascular calcifications. IMPRESSION: No acute osseous injury of the right knee. Electronically Signed   By: Deatra Robinson M.D.   On: 03/27/2016 03:46   Dg Hip Unilat W Or Wo Pelvis 2-3 Views Right  Result Date: 03/27/2016 CLINICAL DATA:  Larey Seat on back while getting out of bed tonight EXAM: DG HIP (WITH OR WITHOUT PELVIS) 2-3V RIGHT COMPARISON:  None. FINDINGS: There are intact appearances of the right total hip arthroplasty hardware. No fracture or dislocation. Pubic symphysis and sacroiliac joints appear intact. IMPRESSION: Negative for acute fracture or dislocation. Electronically Signed   By: Ellery Plunk M.D.   On: 03/27/2016 03:45      Scheduled Meds: . insulin aspart  0-15 Units Subcutaneous TID WC  . rosuvastatin  10 mg Oral q1800  . sodium chloride flush  3 mL Intravenous Q12H   Continuous Infusions:   LOS: 0 days    Time spent:  40 minutes   Noralee Stain, DO [facility] [email or web address] Password [email or web address] 03/28/2016, 8:07 AM

## 2016-03-28 NOTE — Progress Notes (Signed)
Pt given the urinal to use and after he finished using it; RN asked if she can empty the urinal for pt; pt stated " not after I finish pouring it all over you" to RN. Pt started throwing urine at nurse; pt out of bed walking around bed with some of the remaining urine aiming at staff telling them to get out or he will throw it at them. Pt informed to sit back down to prevent fall or injuring himself. Pt starts to thrown urine whenever staff attempt to get close to him. Charge nurse in room; security notified and MD. Paged. Dionne Bucy RN

## 2016-03-28 NOTE — Progress Notes (Addendum)
Pt very agitated and combative.  Demanding to leave, attempting to walk out of room, but requiring use of bedside table for stability.  Staff in room attempting to calm pt and he is ramming into RN with table.  Pt asked if there is anyone we can call for him, he asked that I call his wife and his friend Jonny Ruiz.  Pt now back in bed, a bit calmer, but easy to agitate and start yelling.  Refusing insulin for CBG of 255.  Dr. Alvino Chapel made aware and order received for IM haldol, PRN.  Also aware pt does not have IV access at this time after he pulled two out last night.

## 2016-03-28 NOTE — NC FL2 (Signed)
[place] MEDICAID FL2 LEVEL OF CARE SCREENING TOOL     IDENTIFICATION  Patient Name: [name] Birthdate: 08/04/1940 Sex: male Admission Date (Current Location): 03/27/2016  [facility] and [place] Number:  Producer, television/film/video and Address:  [facility], [address] [place], [place] [address]      Provider Number: [id number]  Attending Physician Name and Address:  Jordan Hawks*  Relative Name and Phone Number:       Current Level of Care: Hospital Recommended Level of Care: Skilled Nursing Facility Prior Approval Number:    Date Approved/Denied:   PASRR Number: [id number]  Discharge Plan: SNF    Current Diagnoses: Patient Active Problem List   Diagnosis Date Noted  . CKD (chronic kidney disease) stage 3, GFR 30-59 ml/min 03/28/2016  . Diabetes (HCC) 03/27/2016  . AKI (acute kidney injury) (HCC) 03/27/2016  . Adjustment disorder with depressed mood 12/25/2015  . Memory deficits 12/01/2015  . Encephalopathy, metabolic 11/28/2015  . Acute left PCA stroke (HCC) 11/25/2015  . Wound dehiscence 03/03/2015  . Lumbar stenosis with neurogenic claudication 02/02/2015  . Cervical spondylosis with myelopathy 04/17/2014  . ICD (implantable cardioverter-defibrillator), biventricular, in situ 11/19/2013  . Cardiac resynchronization therapy defibrillator (CRT-D) in place 07/03/2013  . Chronic renal failure 01/24/2013  . CAD (coronary artery disease) 12/10/2012  . Chronic systolic heart failure (HCC) 11/20/2012  . Cardiomyopathy, ischemic 11/20/2012  . Atrial flutter (HCC) 11/08/2012  . Dilated cardiomyopathy (HCC) 11/01/2012  . Acute encephalopathy 10/29/2012  . Cervical stenosis of spine 10/28/2011  . Spondylosis of cervical joint 10/28/2011  . Cervical myelopathy (HCC) 10/28/2011  . Quadriparesis (HCC) 10/28/2011  . DVT of right proximal brachial through the distal axillary vein, acute 10/06/2011  . Constipation due to pain  medication 10/06/2011  . Venous insufficiency 10/05/2011  . 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 Hyperlipidemia 10/05/2011  . Normocytic anemia 10/03/2011  . Falls frequently 10/03/2011  . Diabetes mellitus with chronic kidney disease (HCC) 10/03/2011  . HTN (hypertension) 10/03/2011  . Gait abnormality secondary to lumbar spinal stenosis 10/03/2011  . Arthritis 10/03/2011    Orientation RESPIRATION BLADDER Height & Weight     Self, Situation, Place  Normal Continent Weight: 74.8 kg (164 lb 14.4 oz) Height:  5\' 11"  (180.3 cm)  BEHAVIORAL SYMPTOMS/MOOD NEUROLOGICAL BOWEL NUTRITION STATUS   (The patient has thrown urine from urinal at staff, he has also attempted to charge out of room.)  (NONE) Continent  (Heart Healthy Carb Modified)  AMBULATORY STATUS COMMUNICATION OF NEEDS Skin   Limited Assist Verbally Normal                       Personal Care Assistance Level of Assistance  Bathing, Feeding, Dressing Bathing Assistance: Limited assistance Feeding assistance: Independent Dressing Assistance: Limited assistance     Functional Limitations Info  Sight, Hearing, Speech Sight Info: Adequate Hearing Info: Adequate Speech Info: Adequate    SPECIAL CARE FACTORS FREQUENCY  PT (By licensed PT), OT (By licensed OT)     PT Frequency: 5/week OT Frequency: 5/week            Contractures Contractures Info: Not present    Additional Factors Info  Code Status, Allergies, Psychotropic, Insulin Sliding Scale Code Status Info: DNR Allergies Info: Acyclovir And Related, Dristan Cold Chlorphen-pe-acetaminophen, Prednisone Psychotropic Info: Seroquel, Haldol Insulin Sliding Scale Info: 3/day  Current Medications (03/28/2016):  This is the current hospital active medication list Current Facility-Administered Medications  Medication Dose Route Frequency Provider Last Rate Last Dose  . 0.9 %   sodium chloride infusion   Intravenous Continuous Jordan HawksJennifer Chahn-Yang Choi, DO      . acetaminophen (TYLENOL) tablet 650 mg  650 mg Oral Q6H PRN Haydee SalterPhillip M Hobbs, MD   650 mg at 03/27/16 2043   Or  . acetaminophen (TYLENOL) suppository 650 mg  650 mg Rectal Q6H PRN Haydee SalterPhillip M Hobbs, MD      . carvedilol (COREG) tablet 12.5 mg  12.5 mg Oral BID WC Carlton AdamJennifer Chahn-Yang Choi, DO   12.5 mg at 03/28/16 1148  . fluticasone (FLONASE) 50 MCG/ACT nasal spray 2 spray  2 spray Each Nare BID Jordan HawksJennifer Chahn-Yang Choi, DO      . haloperidol lactate (HALDOL) injection 2 mg  2 mg Intramuscular Q6H PRN Pete Glatterawn T Langeland, MD   2 mg at 03/28/16 1150  . hydrALAZINE (APRESOLINE) injection 10 mg  10 mg Intravenous Q8H PRN Haydee SalterPhillip M Hobbs, MD      . insulin aspart (novoLOG) injection 0-15 Units  0-15 Units Subcutaneous TID WC Haydee SalterPhillip M Hobbs, MD   3 Units at 03/28/16 [phone]  . polyethylene glycol (MIRALAX / GLYCOLAX) packet 17 g  17 g Oral Daily PRN Haydee SalterPhillip M Hobbs, MD      . QUEtiapine (SEROQUEL) tablet 50 mg  50 mg Oral QHS Jordan HawksJennifer Chahn-Yang Choi, DO      . Rivaroxaban (XARELTO) tablet 15 mg  15 mg Oral Q supper Carlton AdamJennifer Chahn-Yang Choi, DO      . rosuvastatin (CRESTOR) tablet 10 mg  10 mg Oral q1800 Haydee SalterPhillip M Hobbs, MD   10 mg at 03/27/16 1713  . sodium chloride (OCEAN) 0.65 % nasal spray 1 spray  1 spray Each Nare Q1H Jordan HawksJennifer Chahn-Yang Choi, DO   1 spray at 03/28/16 1146  . sodium chloride flush (NS) 0.9 % injection 3 mL  3 mL Intravenous Q12H Haydee SalterPhillip M Hobbs, MD   3 mL at 03/27/16 2043     Discharge Medications: Please see discharge summary for a list of discharge medications.  Relevant Imaging Results:  Relevant Lab Results:   Additional Information ss# [id number].    Venita Lickampbell, Renuka Farfan B, LCSW

## 2016-03-28 NOTE — Progress Notes (Signed)
Pt removed his IV x2; MD aware; prn Haldol adm effective; pt sleeping in bed with call light within reach. Reported off to oncoming RN. Dionne Bucy RN

## 2016-03-28 NOTE — Clinical Social Work Note (Signed)
CSW informed the patient's wife that the patient's care needs are custodial in nature and therefore [facility] Medicare will not cover placement. Wife states she cannot do this and will bring the patient home. She is working on making arrangements for transport to home when he's ready for DC. She states she will call the unit if she is unable to get a ride for the patient, to request ambulance transport. Handoff given to afternoon on call CSW. On call CSW will request assistance from on call RNCM with setting up home services. MD is planning for DC on 12/26. No further CSW intervention needed at this time.   Roddie Mc MSW, [place], [employer], [phone]

## 2016-03-29 ENCOUNTER — Encounter (HOSPITAL_COMMUNITY): Payer: Self-pay | Admitting: Internal Medicine

## 2016-03-29 DIAGNOSIS — T8130XA Disruption of wound, unspecified, initial encounter: Secondary | ICD-10-CM | POA: Diagnosis not present

## 2016-03-29 DIAGNOSIS — G8250 Quadriplegia, unspecified: Secondary | ICD-10-CM | POA: Diagnosis not present

## 2016-03-29 DIAGNOSIS — Z4789 Encounter for other orthopedic aftercare: Secondary | ICD-10-CM | POA: Diagnosis not present

## 2016-03-29 DIAGNOSIS — N179 Acute kidney failure, unspecified: Secondary | ICD-10-CM | POA: Diagnosis not present

## 2016-03-29 DIAGNOSIS — M479 Spondylosis, unspecified: Secondary | ICD-10-CM | POA: Diagnosis not present

## 2016-03-29 DIAGNOSIS — T814XXA Infection following a procedure, initial encounter: Secondary | ICD-10-CM | POA: Diagnosis not present

## 2016-03-29 DIAGNOSIS — F0391 Unspecified dementia with behavioral disturbance: Secondary | ICD-10-CM | POA: Diagnosis present

## 2016-03-29 DIAGNOSIS — F03918 Unspecified dementia, unspecified severity, with other behavioral disturbance: Secondary | ICD-10-CM | POA: Diagnosis present

## 2016-03-29 DIAGNOSIS — R531 Weakness: Secondary | ICD-10-CM | POA: Diagnosis not present

## 2016-03-29 LAB — GLUCOSE, CAPILLARY
GLUCOSE-CAPILLARY: 150 mg/dL — AB (ref 65–99)
GLUCOSE-CAPILLARY: 226 mg/dL — AB (ref 65–99)
Glucose-Capillary: 173 mg/dL — ABNORMAL HIGH (ref 65–99)

## 2016-03-29 LAB — HEMOGLOBIN A1C
HEMOGLOBIN A1C: 6.3 % — AB (ref 4.8–5.6)
Mean Plasma Glucose: 134 mg/dL

## 2016-03-29 LAB — BASIC METABOLIC PANEL
ANION GAP: 10 (ref 5–15)
BUN: 25 mg/dL — ABNORMAL HIGH (ref 6–20)
CHLORIDE: 106 mmol/L (ref 101–111)
CO2: 23 mmol/L (ref 22–32)
Calcium: 9.6 mg/dL (ref 8.9–10.3)
Creatinine, Ser: 1.6 mg/dL — ABNORMAL HIGH (ref 0.61–1.24)
GFR calc Af Amer: 47 mL/min — ABNORMAL LOW (ref >60)
GFR, EST NON AFRICAN AMERICAN: 41 mL/min — AB (ref >60)
GLUCOSE: 148 mg/dL — AB (ref 65–99)
POTASSIUM: 3.8 mmol/L (ref 3.5–5.1)
Sodium: 139 mmol/L (ref 135–145)

## 2016-03-29 MED ORDER — QUETIAPINE FUMARATE 50 MG PO TABS: 50.0000 mg | ORAL_TABLET | Freq: Every day | ORAL | 0 refills | Status: DC

## 2016-03-29 NOTE — Progress Notes (Signed)
CM spoke to son Harvie Heck. He is concerned about his parents d/t the hoarding he states is occurring at their home. CM encouraged him to meet with the [facility] social worker. CM provided sons number to the [facility] agency for the social worker. Son states patient does not have a walker at home. Son asked that it be mailed to the home. CM informed Jermaine with [facility] DME.

## 2016-03-29 NOTE — Progress Notes (Signed)
Patient is discharged from room 5M14 at this time. Alert and in stable condition. IV site d/c'd as well as tele. Instructions read to patient but needs reinforcement. Transported via stretcher by [facility] with all belongings at side.

## 2016-03-29 NOTE — Care Management Note (Signed)
Case Management Note  Patient Details  Name: RISHI GEMMELL MRN: [id number] Date of Birth: 1941-03-05  Subjective/Objective:                    Action/Plan: Pt discharging home today with orders for [facility] services. CM spoke to patients wife over the phone and inquired about [facility] services. Patients wife states they have used [facility] in the past and would like to use them again. Clydie Braun with [facility] informed and they are not able to take patient at this time. CM spoke with Marylene Land at [facility] and she accepted the referral. Pt to transport home via ambulance. Pts son Harvie Heck asked that pt transport at 7 pm tonight. Bedside RN updated.   Expected Discharge Date:                  Expected Discharge Plan:  Home w Home Health Services  In-House Referral:     Discharge planning Services  CM Consult  Post Acute Care Choice:  Home Health Choice offered to:  Spouse, Adult Children (son [place])  DME Arranged:    DME Agency:     HH Arranged:  RN, PT, OT, Nurse's Aide, Social Work [employer] Agency:  [facility]  Status of Service:  Completed, signed off  If discussed at [employer], dates discussed:    Additional Comments:  Kermit Balo, RN 03/29/2016, 12:26 PM

## 2016-03-29 NOTE — Discharge Summary (Signed)
Physician Discharge Summary  Jason ContrasHoward S [name] ZOX:[id number] DOB: 27-Jul-1940 DOA: 03/27/2016  PCP: Evlyn CourierGerald K Hill, MD  Admit date: 03/27/2016 Discharge date: 03/29/2016  Admitted From: Home Disposition:  Home  Recommendations for Outpatient Follow-up:  1. Follow up with PCP in 1 week 2. Please obtain BMP in one week   Home Health: PT OT RN aide SW  Equipment/Devices: None   Discharge Condition: Stable CODE STATUS: Full  Diet recommendation: Heart healthy   Brief/Interim Summary: From H&P: Jeannette HowHoward S McDanielis a 75 y.o.malewith hx significant for CVA, CAD, CHF, dementia with behavioral disturbances, HLD, HTN, falls, CKD who presented with fall out of bed. Patient admitted for AKI. He continued to have behavioral issues while in hospital. Per wife, this is at baseline mentation for him. He was diagnosed with dementia 2-3 years ago as outpatient. AKI was treated with IVF and improved. He will require home health care and SW as he did not qualify for SNF.   Discharge Diagnoses:  Principal Problem:   AKI (acute kidney injury) (HCC) Active Problems:   HTN (hypertension)   CAD (coronary artery disease)   Diabetes (HCC)   CKD (chronic kidney disease) stage 3, GFR 30-59 ml/min   Dementia with behavioral disturbance   AKI on CKD stage 3  -Baseline Cr 1.4 -UA unremarkable for infectious process  -Improving with IVF, trend BMP   Weakness and fall  -Right hip xray, right knee xray, lumbar xray negative  -CT head negative for acute hemorrhage, has old CVA and encephalomalacia  -PT/OT. Home health PT OT   Dementia with behavioral disturbance  -Per wife, he has always been very difficult to deal with and very demanding. Since his stroke in 2015, his behavior has gotten worse, is always confused, verbally abusive, threatening toward her, throwing things her her at home. He also hallucinates at home as well. Sounds like his current behavior at the hospital is his baseline -Seroquel  qhs    Hypertension -Apparently has not been compliant with meds at home, wife not giving meds. He must follow up with PCP.  -Continue coreg, hydralazine, imdur, corlanor   DVT Hx -Continue Xarelto  DM -Hold Amaryl, Metformin, acarbose - restart metformin on discharge  -Ha1c 6.3   Hyperlipidemia -Continue crestor   Discharge Instructions  Discharge Instructions    Diet - low sodium heart healthy    Complete by:  As directed    Increase activity slowly    Complete by:  As directed      Allergies as of 03/29/2016      Reactions   Acyclovir And Related Other (See Comments)   Unknown reaction   Dristan Cold [chlorphen-pe-acetaminophen] Anxiety   Makes pt feel "wired up"   Prednisone Other (See Comments)   Messes with blood sugar levels      Medication List    STOP taking these medications   acarbose 50 MG tablet Commonly known as:  PRECOSE   furosemide 40 MG tablet Commonly known as:  LASIX   gabapentin 300 MG capsule Commonly known as:  NEURONTIN   glimepiride 2 MG tablet Commonly known as:  AMARYL   HYDROcodone-acetaminophen 5-325 MG tablet Commonly known as:  NORCO/VICODIN     TAKE these medications   4-WAY SALINE NA Place 1 spray into both nostrils every hour.   carvedilol 12.5 MG tablet Commonly known as:  COREG Take 1 tablet (12.5 mg total) by mouth 2 (two) times daily with a meal.   fluticasone 50 MCG/ACT nasal spray  Commonly known as:  FLONASE Place 2 sprays into both nostrils daily as needed for allergies.   hydrALAZINE 25 MG tablet Commonly known as:  APRESOLINE TAKE ONE-HALF TABLET BY MOUTH THREE TIMES DAILY What changed:  how much to take  how to take this  when to take this  additional instructions  Another medication with the same name was removed. Continue taking this medication, and follow the directions you see here.   ipratropium 0.06 % nasal spray Commonly known as:  ATROVENT Place 2 sprays into both nostrils 4  (four) times daily.   isosorbide mononitrate 30 MG 24 hr tablet Commonly known as:  IMDUR Take 1 tablet (30 mg total) by mouth daily.   ivabradine 5 MG Tabs tablet Commonly known as:  CORLANOR Take 1 tablet (5 mg total) by mouth 2 (two) times daily with a meal. What changed:  Another medication with the same name was removed. Continue taking this medication, and follow the directions you see here.   magnesium hydroxide 400 MG/5ML suspension Commonly known as:  MILK OF MAGNESIA Take 30 mLs by mouth daily as needed.   menthol-zinc oxide powder Apply 1 application topically 2 (two) times daily as needed (jock itch).   metFORMIN 500 MG tablet Commonly known as:  GLUCOPHAGE Take 1,000 mg by mouth 2 (two) times daily.   NEOSPORIN PLUS PAIN RELIEF MS EX Apply 1 application topically 2 (two) times daily as needed (wound care).   QUEtiapine 50 MG tablet Commonly known as:  SEROQUEL Take 1 tablet (50 mg total) by mouth at bedtime.   Rivaroxaban 15 MG Tabs tablet Commonly known as:  XARELTO Take 1 tablet (15 mg total) by mouth daily with supper.   rosuvastatin 10 MG tablet Commonly known as:  CRESTOR Take 1 tablet (10 mg total) by mouth daily at 6 PM.   silver sulfADIAZINE 1 % cream Commonly known as:  SILVADENE Apply 1 application topically daily as needed (wound care).   sodium chloride 0.65 % nasal spray Commonly known as:  DEEP SEA NASAL SPRAY Place 2 sprays into the nose every 2 (two) hours as needed for congestion.   traZODone 50 MG tablet Commonly known as:  DESYREL Take 1 tablet (50 mg total) by mouth at bedtime as needed for sleep.   TUMS PO Take 1 tablet by mouth 2 (two) times daily as needed (indigestion).      Follow-up Information    [name], MD. Schedule an appointment as soon as possible for a visit in 1 week(s).   Specialty:  Family Medicine Contact information: [address] [place] [address] [phone]          Allergies   Allergen Reactions  . Acyclovir And Related Other (See Comments)    Unknown reaction  . Dristan Cold [Chlorphen-Pe-Acetaminophen] Anxiety    Makes pt feel "wired up"  . Prednisone Other (See Comments)    Messes with blood sugar levels     Consultations:  None   Procedures/Studies: Dg Chest 2 View  Result Date: 03/27/2016 CLINICAL DATA:  Larey Seat on back while getting out of bed tonight EXAM: CHEST  2 VIEW COMPARISON:  None 04/24/2015 FINDINGS: There is stable elevation of the left hemidiaphragm. The lungs are clear. There is no pleural effusion. The pulmonary vasculature is normal. Intact appearances of the transvenous cardiac leads. Hilar and mediastinal contours are unremarkable and unchanged. IMPRESSION: No acute findings Electronically Signed   By: Rosey Bath.D.  On: 03/27/2016 03:44   Dg Lumbar Spine Complete  Result Date: 03/27/2016 CLINICAL DATA:  Larey Seat on back while getting out of bed tonight. EXAM: LUMBAR SPINE - COMPLETE 4+ VIEW COMPARISON:  03/03/2015 FINDINGS: The lumbar vertebrae are normal in height. Moderate degenerative lumbar disc disease is present from L4 through the sacrum. There also is facet arthritis at the same levels. No bone lesion or bony destruction. IMPRESSION: Negative for acute lumbar fracture. Electronically Signed   By: Ellery Plunk M.D.   On: 03/27/2016 03:45   Ct Head Wo Contrast  Result Date: 03/27/2016 CLINICAL DATA:  75 year old male with fall. EXAM: CT HEAD WITHOUT CONTRAST CT CERVICAL SPINE WITHOUT CONTRAST TECHNIQUE: Multidetector CT imaging of the head and cervical spine was performed following the standard protocol without intravenous contrast. Multiplanar CT image reconstructions of the cervical spine were also generated. COMPARISON:  Head CT dated 12/24/2015 FINDINGS: CT HEAD FINDINGS Brain: There has been interval evolution of the previously seen left occipital infarct with encephalomalacia. Otherwise there is mild age-related  atrophy and chronic microvascular ischemic changes. There is no acute intracranial hemorrhage. No mass effect or midline shift noted. No extra-axial fluid collection. Vascular: No hyperdense vessel. Skull: Normal. Negative for fracture or focal lesion.There is advanced atherosclerotic calcification of the visualized left vertebral artery. Sinuses/Orbits: No acute finding. Other: None. CT CERVICAL SPINE FINDINGS Alignment: No acute subluxation.  Cervical scoliosis. Skull base and vertebrae: No acute fracture. There is advanced osteopenia which limits evaluation for fracture. Soft tissues and spinal canal: No prevertebral fluid or swelling. No visible canal hematoma. Bilateral carotid bulb atherosclerotic plaques. Disc levels: There is C3-C6 interbody spacer and anterior fusion. There multilevel degenerative changes with disc space narrowing and endplate irregularity. Multilevel facet hypertrophy and disc osteophyte. There is resulting neural foramina narrowing at C3-C4 on the right, C6-C7 bilaterally. Upper chest: Negative. An accessory azygos fissure as well as pacemaker wires noted. Other: None IMPRESSION: No acute intracranial hemorrhage. 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 Advanced atherosclerotic calcification of the left vertebral artery at the foramen magnum. No acute/ traumatic cervical spine pathology. Advanced osteopenia with extensive degenerative changes of the spine and C3-C6 anterior fusion. Electronically Signed   By: Elgie Collard M.D.   On: 03/27/2016 05:39   Ct Cervical Spine Wo Contrast  Result Date: 03/27/2016 CLINICAL DATA:  75 year old male with fall. EXAM: CT HEAD WITHOUT CONTRAST CT CERVICAL SPINE WITHOUT CONTRAST TECHNIQUE: Multidetector CT imaging of the head and cervical spine was performed following the standard protocol without intravenous contrast. Multiplanar CT image reconstructions of the cervical spine were also generated. COMPARISON:  Head CT dated 12/24/2015  FINDINGS: CT HEAD FINDINGS Brain: There has been interval evolution of the previously seen left occipital infarct with encephalomalacia. Otherwise there is mild age-related atrophy and chronic microvascular ischemic changes. There is no acute intracranial hemorrhage. No mass effect or midline shift noted. No extra-axial fluid collection. Vascular: No hyperdense vessel. Skull: Normal. Negative for fracture or focal lesion.There is advanced atherosclerotic calcification of the visualized left vertebral artery. Sinuses/Orbits: No acute finding. Other: None. CT CERVICAL SPINE FINDINGS Alignment: No acute subluxation.  Cervical scoliosis. Skull base and vertebrae: No acute fracture. There is advanced osteopenia which limits evaluation for fracture. Soft tissues and spinal canal: No prevertebral fluid or swelling. No visible canal hematoma. Bilateral carotid bulb atherosclerotic plaques. Disc levels: There is C3-C6 interbody spacer and anterior fusion. There multilevel degenerative changes with disc space narrowing and endplate irregularity. Multilevel facet hypertrophy and disc osteophyte. There  is resulting neural foramina narrowing at C3-C4 on the right, C6-C7 bilaterally. Upper chest: Negative. An accessory azygos fissure as well as pacemaker wires noted. Other: None IMPRESSION: No acute intracranial hemorrhage. 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 Advanced atherosclerotic calcification of the left vertebral artery at the foramen magnum. No acute/ traumatic cervical spine pathology. Advanced osteopenia with extensive degenerative changes of the spine and C3-C6 anterior fusion. Electronically Signed   By: Elgie Collard M.D.   On: 03/27/2016 05:39   Dg Knee Complete 4 Views Right  Result Date: 03/27/2016 CLINICAL DATA:  Fall with right knee pain EXAM: RIGHT KNEE - COMPLETE 4+ VIEW COMPARISON:  None. FINDINGS: No evidence of fracture, dislocation, or joint effusion. No evidence of arthropathy or other  focal bone abnormality. Soft tissues are unremarkable. There are vascular calcifications. IMPRESSION: No acute osseous injury of the right knee. Electronically Signed   By: Deatra Robinson M.D.   On: 03/27/2016 03:46   Dg Hip Unilat W Or Wo Pelvis 2-3 Views Right  Result Date: 03/27/2016 CLINICAL DATA:  Larey Seat on back while getting out of bed tonight EXAM: DG HIP (WITH OR WITHOUT PELVIS) 2-3V RIGHT COMPARISON:  None. FINDINGS: There are intact appearances of the right total hip arthroplasty hardware. No fracture or dislocation. Pubic symphysis and sacroiliac joints appear intact. IMPRESSION: Negative for acute fracture or dislocation. Electronically Signed   By: Ellery Plunk M.D.   On: 03/27/2016 03:45      Discharge Exam: Vitals:   03/29/16 0555 03/29/16 0900  BP: (!) 148/62 110/70  Pulse: 63 64  Resp: 18 17  Temp: 98.4 F (36.9 C) 98.3 F (36.8 C)   Vitals:   03/28/16 2231 03/29/16 0111 03/29/16 0555 03/29/16 0900  BP: 108/62 (!) 109/48 (!) 148/62 110/70  Pulse: (!) 59 61 63 64  Resp: 18 16 18 17   Temp: 98.4 F (36.9 C) 98 F (36.7 C) 98.4 F (36.9 C) 98.3 F (36.8 C)  TempSrc: Oral Oral Oral Oral  SpO2: 100% 99% 99% 99%  Weight:   74.7 kg (164 lb 9.6 oz)   Height:        General: Pt is sleeping comfortably, not agitated at the moment  Cardiovascular: RRR, S1/S2 +, no rubs, no gallops Respiratory: CTA bilaterally, no wheezing, no rhonchi Abdominal: Soft, NT, ND, bowel sounds + Extremities: no edema, no cyanosis Neuro: nonfocal, not oriented to self place or time     The results of significant diagnostics from this hospitalization (including imaging, microbiology, ancillary and laboratory) are listed below for reference.     Microbiology: No results found for this or any previous visit (from the past 240 hour(s)).   Labs: BNP (last 3 results) No results for input(s): BNP in the last 8760 hours. Basic Metabolic Panel:  Recent Labs Lab 03/27/16 0355  03/27/16 0828 03/28/16 0532 03/29/16 0535  NA 142  --  141 139  K 4.0  --  3.7 3.8  CL 107  --  109 106  CO2 24  --  26 23  GLUCOSE 196*  --  202* 148*  BUN 24*  --  25* 25*  CREATININE 1.99*  --  1.69* 1.60*  CALCIUM 10.1  --  9.4 9.6  MG  --  2.0  --   --   PHOS  --  3.3  --   --    Liver Function Tests: No results for input(s): AST, ALT, ALKPHOS, BILITOT, PROT, ALBUMIN in the last 168 hours. No results  for input(s): LIPASE, AMYLASE in the last 168 hours. No results for input(s): AMMONIA in the last 168 hours. CBC:  Recent Labs Lab 03/27/16 0355 03/28/16 0532  WBC 8.6 5.4  NEUTROABS 5.7  --   HGB 11.6* 10.4*  HCT 35.5* 32.2*  MCV 89.6 89.4  PLT 172 148*   Cardiac Enzymes: No results for input(s): CKTOTAL, CKMB, CKMBINDEX, TROPONINI in the last 168 hours. BNP: Invalid input(s): POCBNP CBG:  Recent Labs Lab 03/28/16 0613 03/28/16 1158 03/28/16 1642 03/28/16 2225 03/29/16 0605  GLUCAP 184* 244* 270* 98 150*   D-Dimer No results for input(s): DDIMER in the last 72 hours. Hgb A1c  Recent Labs  03/27/16 0858  HGBA1C 6.3*   Lipid Profile  Recent Labs  03/28/16 0532  CHOL 123  HDL 22*  LDLCALC 77  TRIG 834  CHOLHDL 5.6   Thyroid function studies  Recent Labs  03/27/16 0858  TSH 0.946   Anemia work up No results for input(s): VITAMINB12, FOLATE, FERRITIN, TIBC, IRON, RETICCTPCT in the last 72 hours. Urinalysis    Component Value Date/Time   COLORURINE YELLOW 03/27/2016 1251   APPEARANCEUR HAZY (A) 03/27/2016 1251   LABSPEC 1.016 03/27/2016 1251   PHURINE 5.0 03/27/2016 1251   GLUCOSEU >=500 (A) 03/27/2016 1251   HGBUR NEGATIVE 03/27/2016 1251   BILIRUBINUR NEGATIVE 03/27/2016 1251   KETONESUR NEGATIVE 03/27/2016 1251   PROTEINUR 30 (A) 03/27/2016 1251   UROBILINOGEN 0.2 10/29/2012 1316   NITRITE NEGATIVE 03/27/2016 1251   LEUKOCYTESUR NEGATIVE 03/27/2016 1251   Sepsis Labs Invalid input(s): PROCALCITONIN,  WBC,   LACTICIDVEN Microbiology No results found for this or any previous visit (from the past 240 hour(s)).   Time coordinating discharge: Over 30 minutes  SIGNED:  Noralee Stain, DO [facility] Pager [phone]  If 7PM-7AM, please contact night-coverage [email or web address] Password [facility] 03/29/2016, 10:51 AM

## 2016-03-29 NOTE — Care Management Obs Status (Signed)
MEDICARE OBSERVATION STATUS NOTIFICATION   Patient Details  Name: [name] MRN: [id number] Date of Birth: [birth year]   Medicare Observation Status Notification Given:  Yes    Kermit Balo, RN 03/29/2016, 12:01 PM

## 2016-03-31 ENCOUNTER — Other Ambulatory Visit: Payer: Self-pay | Admitting: Nurse Practitioner

## 2016-03-31 DIAGNOSIS — N182 Chronic kidney disease, stage 2 (mild): Principal | ICD-10-CM

## 2016-03-31 DIAGNOSIS — E1122 Type 2 diabetes mellitus with diabetic chronic kidney disease: Secondary | ICD-10-CM

## 2016-04-01 ENCOUNTER — Other Ambulatory Visit: Payer: Self-pay

## 2016-04-01 NOTE — Patient Outreach (Signed)
[facility]) Care Management  04/01/2016  DECKER NITCHMAN 12-18-1940 [id number]     Transition of Care Referral  Referral Date: 03/31/16 Referral Source: [facility] Tier 4 List Discharge Report Date of Discharge: 03/29/16 Facility: [facility] Insurance: [facility] Medicare   Outreach attempt # 1 to patient. No answer at present. RN CM left HIPAA compliant voicemail message along with contact info.   Plan: RN CM will make outreach attempt to patient within three business days if no return call from patient.   Antionette Fairy, RN,BSN,CCM [facility] Care Management Telephonic Care Management Coordinator Direct Phone: [phone] Toll Free: [phone] Fax: [phone]

## 2016-04-05 ENCOUNTER — Encounter (HOSPITAL_COMMUNITY): Payer: Self-pay | Admitting: Emergency Medicine

## 2016-04-05 ENCOUNTER — Emergency Department (HOSPITAL_COMMUNITY)
Admission: EM | Admit: 2016-04-05 | Discharge: 2016-04-05 | Disposition: A | Discharge status: Home / Self Care | Payer: Medicare Other | Attending: Emergency Medicine | Admitting: Emergency Medicine

## 2016-04-05 ENCOUNTER — Other Ambulatory Visit: Payer: Self-pay

## 2016-04-05 DIAGNOSIS — M79662 Pain in left lower leg: Secondary | ICD-10-CM | POA: Insufficient documentation

## 2016-04-05 DIAGNOSIS — I130 Hypertensive heart and chronic kidney disease with heart failure and stage 1 through stage 4 chronic kidney disease, or unspecified chronic kidney disease: Secondary | ICD-10-CM | POA: Diagnosis not present

## 2016-04-05 DIAGNOSIS — R627 Adult failure to thrive: Secondary | ICD-10-CM | POA: Diagnosis not present

## 2016-04-05 DIAGNOSIS — M79605 Pain in left leg: Secondary | ICD-10-CM

## 2016-04-05 DIAGNOSIS — G8929 Other chronic pain: Secondary | ICD-10-CM | POA: Diagnosis not present

## 2016-04-05 DIAGNOSIS — M79661 Pain in right lower leg: Secondary | ICD-10-CM | POA: Insufficient documentation

## 2016-04-05 DIAGNOSIS — Z96641 Presence of right artificial hip joint: Secondary | ICD-10-CM | POA: Diagnosis not present

## 2016-04-05 DIAGNOSIS — I252 Old myocardial infarction: Secondary | ICD-10-CM | POA: Diagnosis not present

## 2016-04-05 DIAGNOSIS — I2510 Atherosclerotic heart disease of native coronary artery without angina pectoris: Secondary | ICD-10-CM | POA: Insufficient documentation

## 2016-04-05 DIAGNOSIS — Z7984 Long term (current) use of oral hypoglycemic drugs: Secondary | ICD-10-CM | POA: Insufficient documentation

## 2016-04-05 DIAGNOSIS — M79606 Pain in leg, unspecified: Secondary | ICD-10-CM | POA: Diagnosis not present

## 2016-04-05 DIAGNOSIS — I5022 Chronic systolic (congestive) heart failure: Secondary | ICD-10-CM | POA: Insufficient documentation

## 2016-04-05 DIAGNOSIS — E1122 Type 2 diabetes mellitus with diabetic chronic kidney disease: Secondary | ICD-10-CM | POA: Diagnosis not present

## 2016-04-05 DIAGNOSIS — Z7901 Long term (current) use of anticoagulants: Secondary | ICD-10-CM | POA: Insufficient documentation

## 2016-04-05 DIAGNOSIS — N183 Chronic kidney disease, stage 3 (moderate): Secondary | ICD-10-CM | POA: Diagnosis not present

## 2016-04-05 DIAGNOSIS — M79604 Pain in right leg: Secondary | ICD-10-CM

## 2016-04-05 NOTE — ED Notes (Signed)
ED Provider at bedside. 

## 2016-04-05 NOTE — ED Notes (Signed)
Patient continues to yell, stating "I need something for my nose, I cannot breathe." Patient 100% on RA.

## 2016-04-05 NOTE — ED Provider Notes (Signed)
[facility]-EMERGENCY DEPT Provider Note   CSN: [id number] Arrival date & time: 04/05/16  1607     History   Chief Complaint Chief Complaint  Patient presents with  . Leg Pain    HPI [name] is a 76 y.o. male.  HPI Pt is brought to the ER today complaining of chronic bilateral lower leg pain for months. He contacted the ambulance for his wife as she was having n/v/d. He wanted her evaluated, but when the EMS team showed up they were concerned for the patient as there was no heat in the house and they report there was "evidence of hoarding". Pt without any focused complaint at this time. States he did not want to be seen in the ER today, he simply wanted his wife evaluated   Past Medical History:  Diagnosis Date  . Acute encephalopathy   . AICD (automatic cardioverter/defibrillator) present   . Anemia   . Arthritis    "all over" (05/16/2013)  . Atrial flutter (HCC)   . CAD (coronary artery disease)   . Cardiomyopathy (HCC)    a. 10/2012 Echo: EF 20-25%, diff HK, mod dil LA/RV/RA.  Marland Kitchen Cervical myelopathy (HCC)   . Cervical spinal stenosis   . Chronic systolic congestive heart failure (HCC)   . CKD (chronic kidney disease), stage III   . 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 CVA (cerebral vascular accident) (HCC)   . Decreased vision of right eye   . Dementia   . Depression    "son died in service in 1990-08-21; still bothers me" (05/16/2013)  . DVT (deep venous thrombosis) (HCC) 10/2011   RUE; pt denies this hx on 05/16/2013  . Dysrhythmia   . Frequent falls   . GERD (gastroesophageal reflux disease)   . H/O hiatal hernia   . Hyperlipidemia   . Hypertension   . Hypoglycemia   . Lumbar spinal stenosis   . Mononeuritis of unspecified site   . Myocardial infarction   . Pneumonia 1960   , also 2014  . Quadriparesis (HCC) 08-21-2011   pre op  . Shortness of breath dyspnea    with exertion  . Type  II diabetes mellitus (HCC)    type 2  . Venous insufficiency     Patient Active Problem List   Diagnosis Date Noted  . Dementia with behavioral disturbance 03/29/2016  . CKD (chronic kidney disease) stage 3, GFR 30-59 ml/min 03/28/2016  . Diabetes (HCC) 03/27/2016  . AKI (acute kidney injury) (HCC) 03/27/2016  . Adjustment disorder with depressed mood 12/25/2015  . Memory deficits 12/01/2015  . Encephalopathy, metabolic 11/28/2015  . Acute left PCA stroke (HCC) 11/25/2015  . Wound dehiscence 03/03/2015  . Lumbar stenosis with neurogenic claudication 02/02/2015  . Cervical spondylosis with myelopathy 04/17/2014  . ICD (implantable cardioverter-defibrillator), biventricular, in situ 11/19/2013  . Cardiac resynchronization therapy defibrillator (CRT-D) in place 07/03/2013  . Chronic renal failure 01/24/2013  . CAD (coronary artery disease) 12/10/2012  . Chronic systolic heart failure (HCC) 11/20/2012  . Cardiomyopathy, ischemic 11/20/2012  . Atrial flutter (HCC) 11/08/2012  . Dilated cardiomyopathy (HCC) 11/01/2012  . Cervical stenosis of spine 10/28/2011  . Spondylosis of cervical joint 10/28/2011  . Cervical myelopathy (HCC) 10/28/2011  . Quadriparesis (HCC) 10/28/2011  . DVT of right proximal brachial through the distal axillary vein, acute 10/06/2011  . Constipation due to pain medication 10/06/2011  . Venous insufficiency  10/05/2011  . 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 Hyperlipidemia 10/05/2011  . Normocytic anemia 10/03/2011  . Falls frequently 10/03/2011  . Diabetes mellitus with chronic kidney disease (HCC) 10/03/2011  . HTN (hypertension) 10/03/2011  . Gait abnormality secondary to lumbar spinal stenosis 10/03/2011  . Arthritis 10/03/2011    Past Surgical History:  Procedure Laterality Date  . ANTERIOR CERVICAL DECOMP/DISCECTOMY FUSION  10/31/2011   Procedure: ANTERIOR CERVICAL  DECOMPRESSION/DISCECTOMY FUSION 2 LEVELS;  Surgeon: Cristi Loron, MD;  Location: [facility];  Service: Neurosurgery;  Laterality: N/A;  Cervical Four-Five Cervical Five-Six Anterior Cervical Decompression with fusion interbody prothesis plating and bonegraft  . ANTERIOR CERVICAL DECOMP/DISCECTOMY FUSION N/A 04/17/2014   Procedure: CERVICAL THREE TO FOUR ANTERIOR CERVICAL DECOMPRESSION/DISCECTOMY FUSION 1 LEVEL/HARDWARE REMOVAL;  Surgeon: Tressie Stalker, MD;  Location: [facility];  Service: Neurosurgery;  Laterality: N/A;  C34 anterior cervical decompression with fusion interbody prosthesis plating and bonegraft with exploration of prev fusion and possible removal of old hardware  . BI-VENTRICULAR IMPLANTABLE CARDIOVERTER DEFIBRILLATOR N/A 05/16/2013   Procedure: BI-VENTRICULAR IMPLANTABLE CARDIOVERTER DEFIBRILLATOR  (CRT-D);  Surgeon: Marinus Maw, MD;  Location: [facility] CATH LAB;  Service: Cardiovascular;  Laterality: N/A;  . BI-VENTRICULAR IMPLANTABLE CARDIOVERTER DEFIBRILLATOR  (CRT-D) Left 05/16/2013   STJ CRTD implanted by Dr Ladona Ridgel for primary prevention/CHF  . CARDIAC CATHETERIZATION  10/2012  . CARPAL TUNNEL RELEASE Right ~ 2013  . CATARACT EXTRACTION W/ INTRAOCULAR LENS IMPLANT Bilateral   . JOINT REPLACEMENT  2013  . LEFT AND RIGHT HEART CATHETERIZATION WITH CORONARY ANGIOGRAM N/A 11/06/2012   Procedure: LEFT AND RIGHT HEART CATHETERIZATION WITH CORONARY ANGIOGRAM;  Surgeon: Laurey Morale, MD;  Location: [facility] CATH LAB;  Service: Cardiovascular;  Laterality: N/A;  . LUMBAR LAMINECTOMY/DECOMPRESSION MICRODISCECTOMY N/A 02/02/2015   Procedure: LUMBAR LAMINECTOMY/DECOMPRESSION MICRODISCECTOMY 2 LEVELS;  Surgeon: Tressie Stalker, MD;  Location: [facility];  Service: Neurosurgery;  Laterality: N/A;  L34 L45 laminectomy and foraminotomy  . LUMBAR WOUND DEBRIDEMENT N/A 03/04/2015   Procedure: Incision and Drainage of LUMBAR WOUND ;  Surgeon: Tressie Stalker, MD;  Location: [facility];   Service: Neurosurgery;  Laterality: N/A;  . 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 toe removal  Left    3rd and 4th toe removed  . TOTAL HIP ARTHROPLASTY Right ~ 2012       Home Medications    Prior to Admission medications   Medication Sig Start Date End Date Taking? Authorizing Provider  Calcium Carbonate Antacid (TUMS PO) Take 1 tablet by mouth 2 (two) times daily as needed (indigestion).    Historical Provider, MD  carvedilol (COREG) 12.5 MG tablet Take 1 tablet (12.5 mg total) by mouth 2 (two) times daily with a meal. 12/16/15   Sharon Seller, NP  fluticasone (FLONASE) 50 MCG/ACT nasal spray Place 2 sprays into both nostrils daily as needed for allergies. Patient not taking: Reported on 03/27/2016 12/16/15   Sharon Seller, NP  hydrALAZINE (APRESOLINE) 25 MG tablet TAKE ONE-HALF TABLET BY MOUTH THREE TIMES DAILY Patient taking differently: Take 12.5 mg by mouth 3 (three) times daily.  12/16/15   Sharon Seller, NP  ipratropium (ATROVENT) 0.06 % nasal spray Place 2 sprays into both nostrils 4 (four) times daily. Patient not taking: Reported on 03/27/2016 01/26/16   Linna Hoff, MD  isosorbide mononitrate (IMDUR) 30 MG 24 hr tablet Take 1 tablet (30 mg total)  by mouth daily. 12/16/15   Sharon Seller, NP  ivabradine (CORLANOR) 5 MG TABS tablet Take 1 tablet (5 mg total) by mouth 2 (two) times daily with a meal. 12/16/15   Sharon Seller, NP  magnesium hydroxide (MILK OF MAGNESIA) 400 MG/5ML suspension Take 30 mLs by mouth daily as needed. Patient not taking: Reported on 03/27/2016 12/16/15   Sharon Seller, NP  menthol-zinc oxide (GOLD BOND) powder Apply 1 application topically 2 (two) times daily as needed (jock itch).    Historical Provider, MD  metFORMIN (GLUCOPHAGE) 500 MG tablet Take 1,000 mg by mouth 2 (two) times daily. 02/04/16   Historical Provider, MD  Nasal Moisturizer Combination (4-WAY SALINE NA) Place 1 spray into both nostrils every  hour.    Historical Provider, MD  Neomycin-Polymyxin-Pramoxine (NEOSPORIN PLUS PAIN RELIEF MS EX) Apply 1 application topically 2 (two) times daily as needed (wound care).    Historical Provider, MD  QUEtiapine (SEROQUEL) 50 MG tablet Take 1 tablet (50 mg total) by mouth at bedtime. 03/29/16   Jennifer Chahn-Yang Choi, DO  Rivaroxaban (XARELTO) 15 MG TABS tablet Take 1 tablet (15 mg total) by mouth daily with supper. 12/16/15   Sharon Seller, NP  rosuvastatin (CRESTOR) 10 MG tablet Take 1 tablet (10 mg total) by mouth daily at 6 PM. 12/16/15   Sharon Seller, NP  silver sulfADIAZINE (SILVADENE) 1 % cream Apply 1 application topically daily as needed (wound care).    Historical Provider, MD  sodium chloride (DEEP SEA NASAL SPRAY) 0.65 % nasal spray Place 2 sprays into the nose every 2 (two) hours as needed for congestion. Patient not taking: Reported on 03/27/2016 12/16/15   Sharon Seller, NP  traZODone (DESYREL) 50 MG tablet Take 1 tablet (50 mg total) by mouth at bedtime as needed for sleep. Patient not taking: Reported on 03/27/2016 12/25/15   Charm Rings, NP    Family History Family History  Problem Relation Age of Onset  . Diabetes Brother   . Cancer Father     Lung cancer  . Heart attack Mother     pt thinks she had MI  . Cancer Mother     Social History Social History  Substance Use Topics  . Smoking status: Never Smoker  . Smokeless tobacco: Never Used  . Alcohol use No     Comment: 05/16/2013 "used to drink a little bit a long time ago; nothing in over 30 yrs"     Allergies   Acyclovir and related; Dristan cold [chlorphen-pe-acetaminophen]; and Prednisone   Review of Systems Review of Systems  All other systems reviewed and are negative.    Physical Exam Updated Vital Signs BP 159/98 (BP Location: Left Arm)   Pulse 72   Resp 18   SpO2 100%   Physical Exam  Constitutional: He is oriented to person, place, and time. He appears well-developed and  well-nourished.  HENT:  Head: Normocephalic and atraumatic.  Eyes: EOM are normal.  Neck: Normal range of motion.  Cardiovascular: Normal rate, regular rhythm, normal heart sounds and intact distal pulses.   Pulmonary/Chest: Effort normal and breath sounds normal. No respiratory distress.  Abdominal: Soft. He exhibits no distension. There is no tenderness.  Musculoskeletal: Normal range of motion.  Full ROM of major joints of bilateral lower extremities. Ambulatory in ER  Neurological: He is alert and oriented to person, place, and time.  Skin: Skin is warm and dry.  Psychiatric: He has a normal mood  and affect. Judgment normal.  Nursing note and vitals reviewed.    ED Treatments / Results  Labs (all labs ordered are listed, but only abnormal results are displayed) Labs Reviewed - No data to display  EKG  EKG Interpretation None       Radiology No results found.  Procedures Procedures (including critical care time)  Medications Ordered in ED Medications - No data to display   Initial Impression / Assessment and Plan / ED Course  I have reviewed the triage vital signs and the nursing notes.  Pertinent labs & imaging results that were available during my care of the patient were reviewed by me and considered in my medical decision making (see chart for details).  Clinical Course    Ambulatory. MSE complete. No indication for additional workup. pcp follow up. Taken to the bedside with his wife  Final Clinical Impressions(s) / ED Diagnoses   Final diagnoses:  Chronic pain of both lower extremities    New Prescriptions New Prescriptions   No medications on file     Azalia Bilis, MD 04/05/16 1712

## 2016-04-05 NOTE — Patient Outreach (Signed)
[facility]) Care Management  04/05/2016  [name] 24-Oct-1940 [id number]   Transition of Care Referral  Referral Date: 03/31/16 Referral Source: [facility] Tier 4 List Discharge Report Date of Discharge: 03/29/16 Facility: [facility] Insurance: [facility] Medicare    Outreach attempt #2. No answer at present and RN CM unable to leave message.    Plan: RN CM will make outreach attempt to patient within one business day.    Antionette Fairy, RN,BSN,CCM [facility] Care Management Telephonic Care Management Coordinator Direct Phone: [phone] Toll Free: [phone] Fax: [phone]

## 2016-04-05 NOTE — ED Notes (Signed)
Bed: [facility] Expected date:  Expected time:  Means of arrival:  Comments: 

## 2016-04-05 NOTE — ED Triage Notes (Addendum)
Per PTAR, patient from home with no heat, c/o bilateral leg pain and loss of appetite. Ambulatory with PTAR assistance. Home PT called for welfare check.

## 2016-04-06 ENCOUNTER — Other Ambulatory Visit: Payer: Self-pay

## 2016-04-06 ENCOUNTER — Ambulatory Visit: Payer: Medicare Other | Admitting: Neurology

## 2016-04-06 ENCOUNTER — Telehealth: Payer: Self-pay

## 2016-04-06 NOTE — Patient Outreach (Signed)
[facility]) Care Management  04/06/2016  NOLTON MARCHESCHI [birth year] [id number]   Transition of Care Referral  Referral Date: 03/31/16 Referral Source: [facility] Tier 4 List Discharge Report Date of Discharge: 03/29/16 Facility: [facility] Insurance: [facility] Medicare   Outreach attempt # 3 to patient. No answer at present. RN CM unable to leave voicemail as mailbox full.     Plan: RN CM will send unsuccessful outreach letter to patient and close case if no response from patient within 10 business days.    Antionette Fairy, RN,BSN,CCM [facility] Care Management Telephonic Care Management Coordinator Direct Phone: [phone] Toll Free: [phone] Fax: [phone]

## 2016-04-06 NOTE — Telephone Encounter (Signed)
PT WAS A NO SHOW FOR APPT TODAY. 

## 2016-04-07 ENCOUNTER — Encounter: Payer: Self-pay | Admitting: Neurology

## 2016-04-08 ENCOUNTER — Telehealth: Payer: Self-pay | Admitting: Cardiology

## 2016-04-08 DIAGNOSIS — M1380 Other specified arthritis, unspecified site: Secondary | ICD-10-CM | POA: Diagnosis not present

## 2016-04-08 DIAGNOSIS — I5022 Chronic systolic (congestive) heart failure: Secondary | ICD-10-CM | POA: Diagnosis not present

## 2016-04-08 DIAGNOSIS — N189 Chronic kidney disease, unspecified: Secondary | ICD-10-CM | POA: Diagnosis not present

## 2016-04-08 DIAGNOSIS — I2510 Atherosclerotic heart disease of native coronary artery without angina pectoris: Secondary | ICD-10-CM | POA: Diagnosis not present

## 2016-04-08 DIAGNOSIS — N183 Chronic kidney disease, stage 3 (moderate): Secondary | ICD-10-CM | POA: Diagnosis not present

## 2016-04-08 DIAGNOSIS — M79604 Pain in right leg: Secondary | ICD-10-CM | POA: Diagnosis not present

## 2016-04-08 DIAGNOSIS — D649 Anemia, unspecified: Secondary | ICD-10-CM | POA: Diagnosis not present

## 2016-04-08 DIAGNOSIS — E1122 Type 2 diabetes mellitus with diabetic chronic kidney disease: Secondary | ICD-10-CM | POA: Diagnosis not present

## 2016-04-08 DIAGNOSIS — I639 Cerebral infarction, unspecified: Secondary | ICD-10-CM | POA: Diagnosis not present

## 2016-04-08 DIAGNOSIS — M79605 Pain in left leg: Secondary | ICD-10-CM | POA: Diagnosis not present

## 2016-04-08 DIAGNOSIS — I69359 Hemiplegia and hemiparesis following cerebral infarction affecting unspecified side: Secondary | ICD-10-CM | POA: Diagnosis not present

## 2016-04-08 DIAGNOSIS — Z95810 Presence of automatic (implantable) cardiac defibrillator: Secondary | ICD-10-CM | POA: Diagnosis not present

## 2016-04-08 DIAGNOSIS — E118 Type 2 diabetes mellitus with unspecified complications: Secondary | ICD-10-CM | POA: Diagnosis not present

## 2016-04-08 DIAGNOSIS — R2689 Other abnormalities of gait and mobility: Secondary | ICD-10-CM | POA: Diagnosis not present

## 2016-04-08 DIAGNOSIS — M6281 Muscle weakness (generalized): Secondary | ICD-10-CM | POA: Diagnosis not present

## 2016-04-08 DIAGNOSIS — M4802 Spinal stenosis, cervical region: Secondary | ICD-10-CM | POA: Diagnosis not present

## 2016-04-08 NOTE — Telephone Encounter (Signed)
Attempted to call pt and request a manual transmission be sent b/c his home monitor has not updated recently. No answer and unable to leave a message.

## 2016-04-11 DIAGNOSIS — M79605 Pain in left leg: Secondary | ICD-10-CM | POA: Diagnosis not present

## 2016-04-11 DIAGNOSIS — N189 Chronic kidney disease, unspecified: Secondary | ICD-10-CM | POA: Diagnosis not present

## 2016-04-11 DIAGNOSIS — M79604 Pain in right leg: Secondary | ICD-10-CM | POA: Diagnosis not present

## 2016-04-11 DIAGNOSIS — E118 Type 2 diabetes mellitus with unspecified complications: Secondary | ICD-10-CM | POA: Diagnosis not present

## 2016-04-11 DIAGNOSIS — I639 Cerebral infarction, unspecified: Secondary | ICD-10-CM | POA: Diagnosis not present

## 2016-04-19 ENCOUNTER — Telehealth: Payer: Self-pay | Admitting: Neurology

## 2016-04-19 NOTE — Telephone Encounter (Signed)
Note below noted.

## 2016-04-19 NOTE — Telephone Encounter (Signed)
[  pt's daughter-in-law Lynden Ang [phone]) called said the pt is now at [facility] where he will remain.  York Spaniel his wife is at another nursing facility. York Spaniel the wife was admitted to ICU with pneumonia, malnourished, intestinal blockage. The son/daughter-in-law has POA now. She will be faxing POA documents to clinic.

## 2016-04-20 ENCOUNTER — Other Ambulatory Visit: Payer: Self-pay

## 2016-04-20 NOTE — Patient Outreach (Signed)
[facility]) Care Management  04/20/2016  [name] Jul 22, 1940 [id number]   Transition of Care Referral  Referral Date: 03/31/16 Referral Source: [facility] Tier 4 List Discharge Report Date of Discharge: 03/29/16 Facility: [facility] Insurance: [facility] Medicare    Multiple attempts to establish contact with patient. No response from letter mailed to patient. Case is being closed at this time.     Plan: RN CM will notify [facility] administrative assistant of case closure status. RN CM will send MD case closure letter.  Antionette Fairy, RN,BSN,CCM [facility] Care Management Telephonic Care Management Coordinator Direct Phone: [phone] Toll Free: [phone] Fax: [phone]

## 2016-04-28 ENCOUNTER — Telehealth: Payer: Self-pay | Admitting: Cardiology

## 2016-04-28 ENCOUNTER — Encounter: Payer: Self-pay | Admitting: Cardiology

## 2016-04-28 NOTE — Telephone Encounter (Signed)
Attempted to call pt about disconnected monitor. Number has been disconnected. Will send letter.

## 2016-05-06 DIAGNOSIS — M549 Dorsalgia, unspecified: Secondary | ICD-10-CM | POA: Diagnosis not present

## 2016-05-06 DIAGNOSIS — I639 Cerebral infarction, unspecified: Secondary | ICD-10-CM | POA: Diagnosis not present

## 2016-05-06 DIAGNOSIS — N183 Chronic kidney disease, stage 3 (moderate): Secondary | ICD-10-CM | POA: Diagnosis not present

## 2016-05-06 DIAGNOSIS — E119 Type 2 diabetes mellitus without complications: Secondary | ICD-10-CM | POA: Diagnosis not present

## 2016-05-06 DIAGNOSIS — E785 Hyperlipidemia, unspecified: Secondary | ICD-10-CM | POA: Diagnosis not present

## 2016-05-10 ENCOUNTER — Telehealth: Payer: Self-pay

## 2016-05-10 NOTE — Telephone Encounter (Signed)
Spoke with pts daughter in law about remote transmission, she stated that pt was in a nursing home and that she wasn't sure of where pts home monitor was, informed her that I would order a new monitor and have sent her house. Pts daughter in law stated that she would take it to pts nursing home.

## 2016-05-12 ENCOUNTER — Telehealth: Payer: Self-pay

## 2016-05-12 NOTE — Telephone Encounter (Signed)
Spoke with [name] regarding that pt is overdue to Dr. Ladona Ridgel and also had a recall ICD, informed her that pt needs an apt, [name] stated it would be better to call her husband, pts son. Informed [name] that a scheduler would be calling pt son to make an appointment. [name] voiced understanding.

## 2016-05-24 DIAGNOSIS — I5022 Chronic systolic (congestive) heart failure: Secondary | ICD-10-CM | POA: Diagnosis not present

## 2016-05-24 DIAGNOSIS — I639 Cerebral infarction, unspecified: Secondary | ICD-10-CM | POA: Diagnosis not present

## 2016-05-24 DIAGNOSIS — E118 Type 2 diabetes mellitus with unspecified complications: Secondary | ICD-10-CM | POA: Diagnosis not present

## 2016-05-24 DIAGNOSIS — I2510 Atherosclerotic heart disease of native coronary artery without angina pectoris: Secondary | ICD-10-CM | POA: Diagnosis not present

## 2016-05-31 ENCOUNTER — Other Ambulatory Visit: Payer: Self-pay | Admitting: *Deleted

## 2016-05-31 NOTE — Patient Outreach (Signed)
[facility]) Care Management  05/31/2016  [name] 1940-12-13 [id number]   [facility]) Care Management Post-Acute Care Coordination  05/31/2016  [name] Jun 13, 1940 [id number]   Met with [name], SW for [facility]. Reviewed patient case. She confirms that patient is a [place] resident of facility and there are no plans to discharge at this time.  RNCM will sign off case.  [name]. [name], RN, BSN, [place] Post-Acute Care Coordinator [phone]

## 2016-06-01 DIAGNOSIS — R600 Localized edema: Secondary | ICD-10-CM | POA: Diagnosis not present

## 2016-06-01 DIAGNOSIS — I639 Cerebral infarction, unspecified: Secondary | ICD-10-CM | POA: Diagnosis not present

## 2016-06-01 DIAGNOSIS — M79641 Pain in right hand: Secondary | ICD-10-CM | POA: Diagnosis not present

## 2016-06-01 DIAGNOSIS — E118 Type 2 diabetes mellitus with unspecified complications: Secondary | ICD-10-CM | POA: Diagnosis not present

## 2016-06-01 DIAGNOSIS — W19XXXD Unspecified fall, subsequent encounter: Secondary | ICD-10-CM | POA: Diagnosis not present

## 2016-06-01 DIAGNOSIS — I5022 Chronic systolic (congestive) heart failure: Secondary | ICD-10-CM | POA: Diagnosis not present

## 2016-06-03 ENCOUNTER — Encounter: Payer: Self-pay | Admitting: Internal Medicine

## 2016-06-03 DIAGNOSIS — J309 Allergic rhinitis, unspecified: Secondary | ICD-10-CM | POA: Diagnosis not present

## 2016-06-03 DIAGNOSIS — E118 Type 2 diabetes mellitus with unspecified complications: Secondary | ICD-10-CM | POA: Diagnosis not present

## 2016-06-03 DIAGNOSIS — N189 Chronic kidney disease, unspecified: Secondary | ICD-10-CM | POA: Diagnosis not present

## 2016-06-03 DIAGNOSIS — I639 Cerebral infarction, unspecified: Secondary | ICD-10-CM | POA: Diagnosis not present

## 2016-06-07 ENCOUNTER — Encounter: Payer: Self-pay | Admitting: Internal Medicine

## 2016-06-08 DIAGNOSIS — I5022 Chronic systolic (congestive) heart failure: Secondary | ICD-10-CM | POA: Diagnosis not present

## 2016-06-08 DIAGNOSIS — I639 Cerebral infarction, unspecified: Secondary | ICD-10-CM | POA: Diagnosis not present

## 2016-06-08 DIAGNOSIS — E118 Type 2 diabetes mellitus with unspecified complications: Secondary | ICD-10-CM | POA: Diagnosis not present

## 2016-06-10 DIAGNOSIS — E0822 Diabetes mellitus due to underlying condition with diabetic chronic kidney disease: Secondary | ICD-10-CM | POA: Diagnosis not present

## 2016-06-10 DIAGNOSIS — I509 Heart failure, unspecified: Secondary | ICD-10-CM | POA: Diagnosis not present

## 2016-06-10 DIAGNOSIS — Z95810 Presence of automatic (implantable) cardiac defibrillator: Secondary | ICD-10-CM | POA: Diagnosis not present

## 2016-06-10 DIAGNOSIS — I69359 Hemiplegia and hemiparesis following cerebral infarction affecting unspecified side: Secondary | ICD-10-CM | POA: Diagnosis not present

## 2016-06-14 DIAGNOSIS — M79641 Pain in right hand: Secondary | ICD-10-CM | POA: Diagnosis not present

## 2016-06-14 DIAGNOSIS — M25521 Pain in right elbow: Secondary | ICD-10-CM | POA: Diagnosis not present

## 2016-06-14 DIAGNOSIS — M79631 Pain in right forearm: Secondary | ICD-10-CM | POA: Diagnosis not present

## 2016-06-28 ENCOUNTER — Encounter: Payer: Self-pay | Admitting: Neurology

## 2016-06-28 ENCOUNTER — Ambulatory Visit (INDEPENDENT_AMBULATORY_CARE_PROVIDER_SITE_OTHER): Payer: Medicare Other | Admitting: Neurology

## 2016-06-28 ENCOUNTER — Telehealth: Payer: Self-pay

## 2016-06-28 VITALS — BP 157/76 | HR 72 | Ht 70.0 in

## 2016-06-28 DIAGNOSIS — H53461 Homonymous bilateral field defects, right side: Secondary | ICD-10-CM

## 2016-06-28 NOTE — Telephone Encounter (Signed)
Rn call [facility] facility in [place] to get patients medication list. Pt is in wheelchair, confuse and does not know any of his medications. Rn call facility and was transferred 4 times, multiple staff pick up the phone and kept saying "im going to get a nurse. After 20 minutes on phone Fran Lowes answer the call and stated she can fax the med list. Rn gave her fax number of [phone].Rn explain the doctor cannot do a medication management on pt who had a stroke.Chip Boer verbalized understanding.

## 2016-06-28 NOTE — Progress Notes (Signed)
[facility] [address] [place]. [place] [address] [phone]       OFFICE FOLLOW-UP NOTE  [name] Date of Birth:  January 13, 1941 Medical Record Number:  [id number]   HPI: [name] is a 76 year old Caucasian male seen today for the first office follow-up visit following hospital admission for stroke in [place] 2016-09-09. He is not accompanied by family today. History is obtained from review of Hospital medical records and have personally reviewed imaging studies.[name] is an 76 y.o. male history diabetes mellitus, hypertension, hyperlipidemia, coronary artery disease, CHF and AICD placement, presented on 11/22/15 with speech abnormality for 2 days. Patient   had word finding difficulty as well as nonsensical speech output. Patient reportedly is an avid reader and has been unable to read over the past 2 days. No focal weaknesses been noted. He said no facial droop. No dysarthria was noticed. There was no previous history of stroke or TIA. He's been taking aspirin daily. CT scan of his head showed a large acute left PCA territory ischemic stroke. NIH stroke score was 3.LSN: 11/22/2015 tPA Given: No: Beyond time window for treatment consideration MRI could not be obtained as patient had defibrillator. CT scan of the brain showed a left posterior cerebral artery infarct and CT angiogram showed multifocal stenosis involving the left posterior cerebral artery with occlusion in the P2 segment. Extracranial right carotid artery had 55% stenosis. Transthoracic echo showed normal ejection fraction without cardiac source of embolism. LDL cholesterol was elevated at 82 mg percent. Hemoglobin A1c was 5.6. Patient was started on Xarelto for stroke prevention and seen by physical occupational and speech therapy. He was transferred for rehabilitation and is currently living in a nursing home. I do not do not have any family members accompanying the patient today and nursing home  records are not available. Patient informs me that he is able to walk a little bit with a walker but has to be careful as his right-sided pleural effusion has yet not returned. He also has memory problems and cognitive impairment since his stroke. He has fortunately had no falls or injuries. Is tolerating Xarelto well without bleeding or other side effects.  ROS:   14 system review of systems is positive for  vision loss, memory loss, gait difficulty and all other systems negative PMH:  Past Medical History:  Diagnosis Date  . Acute encephalopathy   . AICD (automatic cardioverter/defibrillator) present   . Anemia   . Arthritis    "all over" (05/16/2013)  . Atrial flutter (HCC)   . CAD (coronary artery disease)   . Cardiomyopathy (HCC)    a. 10/2012 Echo: EF 20-25%, diff HK, mod dil LA/RV/RA.  Marland Kitchen Cervical myelopathy (HCC)   . Cervical spinal stenosis   . Chronic systolic congestive heart failure (HCC)   . CKD (chronic kidney disease), stage III   . 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 CVA (cerebral vascular accident) (HCC)   . Decreased vision of right eye   . Dementia   . Depression    "son died in service in 10-Sep-1990; still bothers me" (05/16/2013)  . DVT (deep venous thrombosis) (HCC) 10/2011   RUE; pt denies this hx on 05/16/2013  . Dysrhythmia   . Frequent falls   . GERD (gastroesophageal reflux disease)   . H/O hiatal hernia   . Hyperlipidemia   . Hypertension   . Hypoglycemia   .  Lumbar spinal stenosis   . Mononeuritis of unspecified site   . Myocardial infarction   . Pneumonia 1960   , also 2014  . Quadriparesis (HCC) 2013   pre op  . Shortness of breath dyspnea    with exertion  . Type II diabetes mellitus (HCC)    type 2  . Venous insufficiency     Social History:  Social History   Social History  . Marital status: Married    Spouse name: N/A  . Number of children: N/A  . Years  of education: N/A   Occupational History  . Not on file.   Social History Main Topics  . Smoking status: Never Smoker  . Smokeless tobacco: Never Used  . Alcohol use No     Comment: 05/16/2013 "used to drink a little bit a long time ago; nothing in over 30 yrs"  . Drug use: No  . Sexual activity: Not Currently   Other Topics Concern  . Not on file   Social History Narrative  . No narrative on file    Medications:   Current Outpatient Prescriptions on File Prior to Visit  Medication Sig Dispense Refill  . Calcium Carbonate Antacid (TUMS PO) Take 1 tablet by mouth 2 (two) times daily as needed (indigestion).    . carvedilol (COREG) 12.5 MG tablet Take 1 tablet (12.5 mg total) by mouth 2 (two) times daily with a meal. 60 tablet 0  . fluticasone (FLONASE) 50 MCG/ACT nasal spray Place 2 sprays into both nostrils daily as needed for allergies. 16 g 0  . hydrALAZINE (APRESOLINE) 25 MG tablet TAKE ONE-HALF TABLET BY MOUTH THREE TIMES DAILY (Patient taking differently: Take 12.5 mg by mouth 3 (three) times daily. ) 45 tablet 0  . ipratropium (ATROVENT) 0.06 % nasal spray Place 2 sprays into both nostrils 4 (four) times daily. 15 mL 1  . isosorbide mononitrate (IMDUR) 30 MG 24 hr tablet Take 1 tablet (30 mg total) by mouth daily. 30 tablet 0  . ivabradine (CORLANOR) 5 MG TABS tablet Take 1 tablet (5 mg total) by mouth 2 (two) times daily with a meal. 60 tablet 0  . magnesium hydroxide (MILK OF MAGNESIA) 400 MG/5ML suspension Take 30 mLs by mouth daily as needed. 355 mL 0  . menthol-zinc oxide (GOLD BOND) powder Apply 1 application topically 2 (two) times daily as needed (jock itch).    . metFORMIN (GLUCOPHAGE) 500 MG tablet Take 1,000 mg by mouth 2 (two) times daily.    . Nasal Moisturizer Combination (4-WAY SALINE NA) Place 1 spray into both nostrils every hour.    Marland Kitchen Neomycin-Polymyxin-Pramoxine (NEOSPORIN PLUS PAIN RELIEF MS EX) Apply 1 application topically 2 (two) times daily as needed  (wound care).    . QUEtiapine (SEROQUEL) 50 MG tablet Take 1 tablet (50 mg total) by mouth at bedtime. 30 tablet 0  . Rivaroxaban (XARELTO) 15 MG TABS tablet Take 1 tablet (15 mg total) by mouth daily with supper. 30 tablet 0  . rosuvastatin (CRESTOR) 10 MG tablet Take 1 tablet (10 mg total) by mouth daily at 6 PM. 30 tablet 0  . silver sulfADIAZINE (SILVADENE) 1 % cream Apply 1 application topically daily as needed (wound care).    . sodium chloride (DEEP SEA NASAL SPRAY) 0.65 % nasal spray Place 2 sprays into the nose every 2 (two) hours as needed for congestion. 30 mL 0  . traZODone (DESYREL) 50 MG tablet Take 1 tablet (50 mg total) by mouth at  bedtime as needed for sleep. 30 tablet 0   No current facility-administered medications on file prior to visit.     Allergies:   Allergies  Allergen Reactions  . Acyclovir And Related Other (See Comments)    Unknown reaction  . Dristan Cold [Chlorphen-Pe-Acetaminophen] Anxiety    Makes pt feel "wired up"  . Prednisone Other (See Comments)    Messes with blood sugar levels     Physical Exam General: Frail elderly Caucasian male seated, in no evident distress Head: head normocephalic and atraumatic.  Neck: supple with no carotid or supraclavicular bruits Cardiovascular: regular rate and rhythm, no murmurs Musculoskeletal: no deformity Skin:  no rash/petichiae Vascular:  Normal pulses all extremities Vitals:   06/28/16 1509  BP: (!) 157/76  Pulse: 72   Neurologic Exam Mental Status: Awake and fully alert. Disoriented to place and time. Recent and remote memory diminished t. Attention span, concentration and fund of knowledge poor Mood and affect appropriate. Diminished recall 0/3. Able to name only 4 animals with four legs Cranial Nerves: Fundoscopic exam reveals sharp disc margins. Pupils equal, briskly reactive to light. Extraocular movements full without nystagmus. Visual fields full Show dense right homonymous hemianopsia  confrontation. Hearing intact. Facial sensation intact. Face, tongue, palate moves normally and symmetrically.  Motor: Normal bulk and tone. Normal strength in all tested extremity muscles. Mild diminished fine finger movements on the right. Orbits left over right upper extremity. Sensory.: intact to touch ,pinprick .position and vibratory sensation.  Coordination: Rapid alternating movements normal in all extremities. Finger-to-nose and heel-to-shin performed accurately bilaterally. Gait and Station: Deferred as patient sitting in a wheelchair and did not bring his walker Reflexes: 1+ and symmetric. Toes downgoing.   NIHSS  5 Modified Rankin 3   ASSESSMENT: 5 year Caucasian male with a cardioembolic left posterior cerebral artery infarct infiltrate 2018 secondary to atrial flutter with residual dense right homonymous hemianopsia and memory loss.    PLAN: I had a long d/w patient about his recent embolic stroke,right sided visual field defect risk for recurrent stroke/TIAs, personally independently reviewed imaging studies and stroke evaluation results and answered questions.Continue Xarelto (rivaroxaban) daily  for secondary stroke prevention and maintain strict control of hypertension with blood pressure goal below 130/90, diabetes with hemoglobin A1c goal below 6.5% and lipids with LDL cholesterol goal below 70 mg/dL. I also advised the patient to eat a healthy diet and use a walker at all times to avoid falls and injuries. Followup in the future with me noly as necessary Greater than 50% of time during this 25 minute visit was spent on counseling,explanation of diagnosis, planning of further management, discussion with patient and family and coordination of care Delia Heady, MD  [facility] [address] [place], [place] [address]  Phone [phone] Fax [phone] Note: This document was prepared with digital dictation and possible smart phrase  technology. Any transcriptional errors that result from this process are unintentional

## 2016-06-28 NOTE — Patient Instructions (Signed)
I had a long d/w patient about his recent embolic stroke,right sided visual field defect risk for recurrent stroke/TIAs, personally independently reviewed imaging studies and stroke evaluation results and answered questions.Continue Xarelto (rivaroxaban) daily  for secondary stroke prevention and maintain strict control of hypertension with blood pressure goal below 130/90, diabetes with hemoglobin A1c goal below 6.5% and lipids with LDL cholesterol goal below 70 mg/dL. I also advised the patient to eat a healthy diet and use a walker at all times to avoid falls and injuries. Followup in the future with me noly as necessary

## 2016-06-29 NOTE — Telephone Encounter (Signed)
Late entry from 06/28/2016: Rn did receive medication list from facility after visit was done. Med list given to Dr. Pearlean Brownie.

## 2016-06-30 ENCOUNTER — Telehealth: Payer: Self-pay | Admitting: Cardiology

## 2016-06-30 NOTE — Telephone Encounter (Signed)
Spoke w/ pt nurse and instructed her to call [employer] tech support to get help setting up pt home monitor. She verbalized understanding.

## 2016-07-05 DIAGNOSIS — E119 Type 2 diabetes mellitus without complications: Secondary | ICD-10-CM | POA: Diagnosis not present

## 2016-07-08 ENCOUNTER — Telehealth: Payer: Self-pay | Admitting: Cardiology

## 2016-07-08 NOTE — Telephone Encounter (Signed)
Spoke w/ pt Nurse Antionette. Explained to her pt device has a battery recall on it and it basically stated that pt device can reach ERI rapidly. Informed her that we could either set up pt monitor today over the phone or pt could bring monitor w/ him to his 09-05-16 appt and we would set it up then. Nurse verbalized that it would be better to bring the monitor to the 09-05-16 appt. Informed her that at this time we are not monitoring pt device and that she needs to make pt aware that if he feels a vibration in his chest he needs to make nursing staff know so they can call the office and we would see him sooner. Pt nurse verbalized understanding.

## 2016-07-27 DIAGNOSIS — I5022 Chronic systolic (congestive) heart failure: Secondary | ICD-10-CM | POA: Diagnosis not present

## 2016-07-27 DIAGNOSIS — M109 Gout, unspecified: Secondary | ICD-10-CM | POA: Diagnosis not present

## 2016-07-27 DIAGNOSIS — I639 Cerebral infarction, unspecified: Secondary | ICD-10-CM | POA: Diagnosis not present

## 2016-07-27 DIAGNOSIS — I2510 Atherosclerotic heart disease of native coronary artery without angina pectoris: Secondary | ICD-10-CM | POA: Diagnosis not present

## 2016-07-29 DIAGNOSIS — I255 Ischemic cardiomyopathy: Secondary | ICD-10-CM | POA: Diagnosis not present

## 2016-07-29 DIAGNOSIS — M79642 Pain in left hand: Secondary | ICD-10-CM | POA: Diagnosis not present

## 2016-07-29 DIAGNOSIS — M7989 Other specified soft tissue disorders: Secondary | ICD-10-CM | POA: Diagnosis not present

## 2016-07-29 DIAGNOSIS — I5022 Chronic systolic (congestive) heart failure: Secondary | ICD-10-CM | POA: Diagnosis not present

## 2016-07-29 DIAGNOSIS — I639 Cerebral infarction, unspecified: Secondary | ICD-10-CM | POA: Diagnosis not present

## 2016-07-29 DIAGNOSIS — M1990 Unspecified osteoarthritis, unspecified site: Secondary | ICD-10-CM | POA: Diagnosis not present

## 2016-07-29 DIAGNOSIS — M109 Gout, unspecified: Secondary | ICD-10-CM | POA: Diagnosis not present

## 2016-08-03 DIAGNOSIS — M109 Gout, unspecified: Secondary | ICD-10-CM | POA: Diagnosis not present

## 2016-08-03 DIAGNOSIS — I5022 Chronic systolic (congestive) heart failure: Secondary | ICD-10-CM | POA: Diagnosis not present

## 2016-08-03 DIAGNOSIS — I2510 Atherosclerotic heart disease of native coronary artery without angina pectoris: Secondary | ICD-10-CM | POA: Diagnosis not present

## 2016-08-03 DIAGNOSIS — I255 Ischemic cardiomyopathy: Secondary | ICD-10-CM | POA: Diagnosis not present

## 2016-08-05 DIAGNOSIS — N183 Chronic kidney disease, stage 3 (moderate): Secondary | ICD-10-CM | POA: Diagnosis not present

## 2016-08-05 DIAGNOSIS — E0822 Diabetes mellitus due to underlying condition with diabetic chronic kidney disease: Secondary | ICD-10-CM | POA: Diagnosis not present

## 2016-08-05 DIAGNOSIS — I69359 Hemiplegia and hemiparesis following cerebral infarction affecting unspecified side: Secondary | ICD-10-CM | POA: Diagnosis not present

## 2016-08-05 DIAGNOSIS — M549 Dorsalgia, unspecified: Secondary | ICD-10-CM | POA: Diagnosis not present

## 2016-08-30 DIAGNOSIS — E118 Type 2 diabetes mellitus with unspecified complications: Secondary | ICD-10-CM | POA: Diagnosis not present

## 2016-08-30 DIAGNOSIS — I5022 Chronic systolic (congestive) heart failure: Secondary | ICD-10-CM | POA: Diagnosis not present

## 2016-08-30 DIAGNOSIS — I639 Cerebral infarction, unspecified: Secondary | ICD-10-CM | POA: Diagnosis not present

## 2016-09-02 DIAGNOSIS — R319 Hematuria, unspecified: Secondary | ICD-10-CM | POA: Diagnosis not present

## 2016-09-02 DIAGNOSIS — N390 Urinary tract infection, site not specified: Secondary | ICD-10-CM | POA: Diagnosis not present

## 2016-09-05 ENCOUNTER — Encounter: Payer: Self-pay | Admitting: Internal Medicine

## 2016-09-05 ENCOUNTER — Ambulatory Visit (INDEPENDENT_AMBULATORY_CARE_PROVIDER_SITE_OTHER): Payer: Medicare Other | Admitting: Internal Medicine

## 2016-09-05 VITALS — BP 88/48 | HR 64 | Ht 70.0 in

## 2016-09-05 DIAGNOSIS — I5022 Chronic systolic (congestive) heart failure: Secondary | ICD-10-CM | POA: Diagnosis not present

## 2016-09-05 DIAGNOSIS — I255 Ischemic cardiomyopathy: Secondary | ICD-10-CM

## 2016-09-05 DIAGNOSIS — Z95810 Presence of automatic (implantable) cardiac defibrillator: Secondary | ICD-10-CM

## 2016-09-05 LAB — CUP PACEART INCLINIC DEVICE CHECK
Date Time Interrogation Session: 20180604161613
HIGH POWER IMPEDANCE MEASURED VALUE: 57.375
Implantable Lead Implant Date: 20150212
Implantable Lead Implant Date: 20150212
Implantable Lead Implant Date: 20150212
Implantable Lead Location: 753858
Implantable Lead Model: 7122
Implantable Pulse Generator Implant Date: 20150212
Lead Channel Impedance Value: 437.5 Ohm
Lead Channel Impedance Value: 587.5 Ohm
Lead Channel Pacing Threshold Amplitude: 1.25 V
Lead Channel Pacing Threshold Amplitude: 1.25 V
Lead Channel Pacing Threshold Pulse Width: 0.5 ms
Lead Channel Setting Pacing Amplitude: 2.25 V
Lead Channel Setting Pacing Amplitude: 2.25 V
Lead Channel Setting Pacing Amplitude: 2.5 V
Lead Channel Setting Pacing Pulse Width: 0.8 ms
MDC IDC LEAD LOCATION: 753859
MDC IDC LEAD LOCATION: 753860
MDC IDC MSMT BATTERY REMAINING LONGEVITY: 34 mo
MDC IDC MSMT LEADCHNL LV PACING THRESHOLD PULSEWIDTH: 0.8 ms
MDC IDC MSMT LEADCHNL RA PACING THRESHOLD AMPLITUDE: 1 V
MDC IDC MSMT LEADCHNL RA PACING THRESHOLD PULSEWIDTH: 0.5 ms
MDC IDC MSMT LEADCHNL RA SENSING INTR AMPL: 2.9 mV
MDC IDC MSMT LEADCHNL RV IMPEDANCE VALUE: 400 Ohm
MDC IDC MSMT LEADCHNL RV SENSING INTR AMPL: 11.9 mV
MDC IDC SET LEADCHNL RV PACING PULSEWIDTH: 0.5 ms
MDC IDC SET LEADCHNL RV SENSING SENSITIVITY: 0.5 mV
MDC IDC STAT BRADY RA PERCENT PACED: 37 %
MDC IDC STAT BRADY RV PERCENT PACED: 98 %
Pulse Gen Serial Number: 7170834

## 2016-09-05 MED ORDER — CARVEDILOL 6.25 MG PO TABS: 6.2500 mg | ORAL_TABLET | Freq: Two times a day (BID) | ORAL | 3 refills | Status: DC

## 2016-09-05 NOTE — Patient Instructions (Addendum)
Medication Instructions:  Your physician has recommended you make the following change in your medication:  DECREASE Coreg to 6.25 mg twice a day   Labwork: None Ordered   Testing/Procedures: None Ordered   Follow-Up: Your physician wants you to follow-up in: 1 year with Dr. Ladona Ridgel. You will receive a reminder letter in the mail two months in advance. If you don't receive a letter, please call our office to schedule the follow-up appointment.  Remote monitoring is used to monitor your ICD from home. This monitoring reduces the number of office visits required to check your device to one time per year. It allows [place] to keep an eye on the functioning of your device to ensure it is working properly. You are scheduled for a device check from home on  12/06/16 . You may send your transmission at any time that day. If you have a wireless device, the transmission will be sent automatically. After your physician reviews your transmission, you will receive a postcard with your next transmission date.    Any Other Special Instructions Will Be Listed Below (If Applicable).     If you need a refill on your cardiac medications before your next appointment, please call your pharmacy.

## 2016-09-05 NOTE — Progress Notes (Signed)
HPI [name] returns today for ongoing ICD management. He is a pleasant retired Doctor, hospital with a h/o longstanding CHF, HTN, and severe arthritis. He has LBBB and class 3 CHF symptoms and an EF of 20% which has been longstanding despite maximal medical therapy. He has chronic peripheral edema. He has not had syncope. He does have CAD but it is thought that the severity of his CAD is not commensurate with the degree of his LV dysfunction. He underwent biventricular ICD implantation approximately 3 years ago.   He has minimal peripheral edema. No ICD shock. No syncope. He has a gradual decline in overall function over the past 2 years. His blood pressure was high but now appears to be a little low.  Allergies  Allergen Reactions  . Acyclovir And Related Other (See Comments)    Unknown reaction  . Dristan Cold [Chlorphen-Pe-Acetaminophen] Anxiety    Makes pt feel "wired up"  . Prednisone Other (See Comments)    Messes with blood sugar levels      Current Outpatient Prescriptions  Medication Sig Dispense Refill  . Calcium Carbonate Antacid (TUMS PO) Take 1 tablet by mouth 2 (two) times daily as needed (indigestion).    . fluticasone (FLONASE) 50 MCG/ACT nasal spray Place 2 sprays into both nostrils daily as needed for allergies. 16 g 0  . hydrALAZINE (APRESOLINE) 25 MG tablet TAKE ONE-HALF TABLET BY MOUTH THREE TIMES DAILY (Patient taking differently: Take 12.5 mg by mouth 3 (three) times daily. ) 45 tablet 0  . ipratropium (ATROVENT) 0.06 % nasal spray Place 2 sprays into both nostrils 4 (four) times daily. 15 mL 1  . isosorbide mononitrate (IMDUR) 30 MG 24 hr tablet Take 1 tablet (30 mg total) by mouth daily. 30 tablet 0  . ivabradine (CORLANOR) 5 MG TABS tablet Take 1 tablet (5 mg total) by mouth 2 (two) times daily with a meal. 60 tablet 0  . magnesium hydroxide (MILK OF MAGNESIA) 400 MG/5ML suspension Take 30 mLs by mouth daily as needed. 355 mL 0  . menthol-zinc oxide (GOLD  BOND) powder Apply 1 application topically 2 (two) times daily as needed (jock itch).    . metFORMIN (GLUCOPHAGE) 500 MG tablet Take 1,000 mg by mouth 2 (two) times daily.    . Nasal Moisturizer Combination (4-WAY SALINE NA) Place 1 spray into both nostrils every hour.    Marland Kitchen Neomycin-Polymyxin-Pramoxine (NEOSPORIN PLUS PAIN RELIEF MS EX) Apply 1 application topically 2 (two) times daily as needed (wound care).    . QUEtiapine (SEROQUEL) 50 MG tablet Take 1 tablet (50 mg total) by mouth at bedtime. 30 tablet 0  . Rivaroxaban (XARELTO) 15 MG TABS tablet Take 1 tablet (15 mg total) by mouth daily with supper. 30 tablet 0  . rosuvastatin (CRESTOR) 10 MG tablet Take 1 tablet (10 mg total) by mouth daily at 6 PM. 30 tablet 0  . silver sulfADIAZINE (SILVADENE) 1 % cream Apply 1 application topically daily as needed (wound care).    . sodium chloride (DEEP SEA NASAL SPRAY) 0.65 % nasal spray Place 2 sprays into the nose every 2 (two) hours as needed for congestion. 30 mL 0  . traZODone (DESYREL) 50 MG tablet Take 1 tablet (50 mg total) by mouth at bedtime as needed for sleep. 30 tablet 0  . carvedilol (COREG) 6.25 MG tablet Take 1 tablet (6.25 mg total) by mouth 2 (two) times daily. 180 tablet 3   No current facility-administered medications for this  visit.      Past Medical History:  Diagnosis Date  . Acute encephalopathy   . AICD (automatic cardioverter/defibrillator) present   . Anemia   . Arthritis    "all over" (05/16/2013)  . Atrial flutter (HCC)   . CAD (coronary artery disease)   . Cardiomyopathy (HCC)    a. 10/2012 Echo: EF 20-25%, diff HK, mod dil LA/RV/RA.  Marland Kitchen Cervical myelopathy (HCC)   . Cervical spinal stenosis   . Chronic systolic congestive heart failure (HCC)   . CKD (chronic kidney disease), stage III   . 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 CVA (cerebral vascular accident) (HCC)   . Decreased  vision of right eye   . Dementia   . Depression    "son died in service in 1990/08/28; still bothers me" (05/16/2013)  . DVT (deep venous thrombosis) (HCC) 10/2011   RUE; pt denies this hx on 05/16/2013  . Dysrhythmia   . Frequent falls   . GERD (gastroesophageal reflux disease)   . H/O hiatal hernia   . Hyperlipidemia   . Hypertension   . Hypoglycemia   . Lumbar spinal stenosis   . Mononeuritis of unspecified site   . Myocardial infarction (HCC)   . Pneumonia 1960   , also 2014  . Quadriparesis (HCC) 28-Aug-2011   pre op  . Shortness of breath dyspnea    with exertion  . Type II diabetes mellitus (HCC)    type 2  . Venous insufficiency     ROS:   All systems reviewed and negative except as noted in the HPI.   Past Surgical History:  Procedure Laterality Date  . ANTERIOR CERVICAL DECOMP/DISCECTOMY FUSION  10/31/2011   Procedure: ANTERIOR CERVICAL DECOMPRESSION/DISCECTOMY FUSION 2 LEVELS;  Surgeon: Cristi Loron, MD;  Location: [facility];  Service: Neurosurgery;  Laterality: N/A;  Cervical Four-Five Cervical Five-Six Anterior Cervical Decompression with fusion interbody prothesis plating and bonegraft  . ANTERIOR CERVICAL DECOMP/DISCECTOMY FUSION N/A 04/17/2014   Procedure: CERVICAL THREE TO FOUR ANTERIOR CERVICAL DECOMPRESSION/DISCECTOMY FUSION 1 LEVEL/HARDWARE REMOVAL;  Surgeon: Tressie Stalker, MD;  Location: [facility];  Service: Neurosurgery;  Laterality: N/A;  C34 anterior cervical decompression with fusion interbody prosthesis plating and bonegraft with exploration of prev fusion and possible removal of old hardware  . BI-VENTRICULAR IMPLANTABLE CARDIOVERTER DEFIBRILLATOR N/A 05/16/2013   Procedure: BI-VENTRICULAR IMPLANTABLE CARDIOVERTER DEFIBRILLATOR  (CRT-D);  Surgeon: Marinus Maw, MD;  Location: [facility] LAB;  Service: Cardiovascular;  Laterality: N/A;  . BI-VENTRICULAR IMPLANTABLE CARDIOVERTER DEFIBRILLATOR  (CRT-D) Left 05/16/2013   STJ CRTD implanted by Dr Ladona Ridgel for  primary prevention/CHF  . CARDIAC CATHETERIZATION  10/2012  . CARPAL TUNNEL RELEASE Right ~ Aug 28, 2011  . CATARACT EXTRACTION W/ INTRAOCULAR LENS IMPLANT Bilateral   . JOINT REPLACEMENT  2011-08-28  . LEFT AND RIGHT HEART CATHETERIZATION WITH CORONARY ANGIOGRAM N/A 11/06/2012   Procedure: LEFT AND RIGHT HEART CATHETERIZATION WITH CORONARY ANGIOGRAM;  Surgeon: Laurey Morale, MD;  Location: [facility] CATH LAB;  Service: Cardiovascular;  Laterality: N/A;  . LUMBAR LAMINECTOMY/DECOMPRESSION MICRODISCECTOMY N/A 02/02/2015   Procedure: LUMBAR LAMINECTOMY/DECOMPRESSION MICRODISCECTOMY 2 LEVELS;  Surgeon: Tressie Stalker, MD;  Location: [facility];  Service: Neurosurgery;  Laterality: N/A;  L34 L45 laminectomy and foraminotomy  . LUMBAR WOUND DEBRIDEMENT N/A 03/04/2015   Procedure: Incision and Drainage of LUMBAR WOUND ;  Surgeon: Tressie Stalker, MD;  Location: [facility];  Service:  Neurosurgery;  Laterality: N/A;  . 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 toe removal  Left    3rd and 4th toe removed  . TOTAL HIP ARTHROPLASTY Right ~ 2012     Family History  Problem Relation Age of Onset  . Diabetes Brother   . Cancer Father        Lung cancer  . Heart attack Mother        pt thinks she had MI  . Cancer Mother      Social History   Social History  . Marital status: Married    Spouse name: N/A  . Number of children: N/A  . Years of education: N/A   Occupational History  . Not on file.   Social History Main Topics  . Smoking status: Never Smoker  . Smokeless tobacco: Never Used  . Alcohol use No     Comment: 05/16/2013 "used to drink a little bit a long time ago; nothing in over 30 yrs"  . Drug use: No  . Sexual activity: Not Currently   Other Topics Concern  . Not on file   Social History Narrative  . No narrative on file     BP (!) 88/48   Pulse 64   Ht 5\' 10"  (1.778 m)   Physical Exam:  Chronically ill appearing 75yo man, NAD HEENT: Unremarkable Neck:  7  cmJVD, no thyromegally Back:  No CVA tenderness Lungs:  Clear with no wheezes HEART:  Regular rate rhythm, no murmurs, no rubs, no clicks Abd:  soft, positive bowel sounds, no organomegally, no rebound, no guarding Ext:  2 plus pulses, no edema, no cyanosis, no clubbing Skin:  No rashes no nodules Neuro:  CN II through XII intact, motor grossly intact  ICD interrogation - normal device function.  Assess/Plan: 1. Chronic systolic heart failure - he appears to be euvolemic today. He will continue his current meds except for a reduction in his coreg 2. Hypertension - his blood pressure is low today. I have recommended he reduce his dose of coreg. 3. ICD - his [employer] device is working normally. Will recheck in several months.  4. Ischemic cardiomyopathy - he is sedentary and denies any anginal symptoms.   [name].D.

## 2016-09-13 DIAGNOSIS — E785 Hyperlipidemia, unspecified: Secondary | ICD-10-CM | POA: Diagnosis not present

## 2016-09-13 DIAGNOSIS — D649 Anemia, unspecified: Secondary | ICD-10-CM | POA: Diagnosis not present

## 2016-09-13 DIAGNOSIS — W19XXXD Unspecified fall, subsequent encounter: Secondary | ICD-10-CM | POA: Diagnosis not present

## 2016-09-13 DIAGNOSIS — E119 Type 2 diabetes mellitus without complications: Secondary | ICD-10-CM | POA: Diagnosis not present

## 2016-09-13 DIAGNOSIS — I10 Essential (primary) hypertension: Secondary | ICD-10-CM | POA: Diagnosis not present

## 2016-09-13 DIAGNOSIS — Z79899 Other long term (current) drug therapy: Secondary | ICD-10-CM | POA: Diagnosis not present

## 2016-09-14 DIAGNOSIS — W19XXXD Unspecified fall, subsequent encounter: Secondary | ICD-10-CM | POA: Diagnosis not present

## 2016-09-14 DIAGNOSIS — R945 Abnormal results of liver function studies: Secondary | ICD-10-CM | POA: Diagnosis not present

## 2016-09-14 DIAGNOSIS — D696 Thrombocytopenia, unspecified: Secondary | ICD-10-CM | POA: Diagnosis not present

## 2016-09-23 DIAGNOSIS — N183 Chronic kidney disease, stage 3 (moderate): Secondary | ICD-10-CM | POA: Diagnosis not present

## 2016-09-23 DIAGNOSIS — G8929 Other chronic pain: Secondary | ICD-10-CM | POA: Diagnosis not present

## 2016-09-23 DIAGNOSIS — I509 Heart failure, unspecified: Secondary | ICD-10-CM | POA: Diagnosis not present

## 2016-09-23 DIAGNOSIS — E0822 Diabetes mellitus due to underlying condition with diabetic chronic kidney disease: Secondary | ICD-10-CM | POA: Diagnosis not present

## 2016-10-07 DIAGNOSIS — I10 Essential (primary) hypertension: Secondary | ICD-10-CM | POA: Diagnosis not present

## 2016-10-07 DIAGNOSIS — E119 Type 2 diabetes mellitus without complications: Secondary | ICD-10-CM | POA: Diagnosis not present

## 2016-10-07 DIAGNOSIS — D649 Anemia, unspecified: Secondary | ICD-10-CM | POA: Diagnosis not present

## 2016-10-07 DIAGNOSIS — Z79899 Other long term (current) drug therapy: Secondary | ICD-10-CM | POA: Diagnosis not present

## 2016-10-07 DIAGNOSIS — E785 Hyperlipidemia, unspecified: Secondary | ICD-10-CM | POA: Diagnosis not present

## 2016-10-10 DIAGNOSIS — R945 Abnormal results of liver function studies: Secondary | ICD-10-CM | POA: Diagnosis not present

## 2016-10-10 DIAGNOSIS — D696 Thrombocytopenia, unspecified: Secondary | ICD-10-CM | POA: Diagnosis not present

## 2016-10-10 DIAGNOSIS — W19XXXD Unspecified fall, subsequent encounter: Secondary | ICD-10-CM | POA: Diagnosis not present

## 2016-10-11 DIAGNOSIS — I5022 Chronic systolic (congestive) heart failure: Secondary | ICD-10-CM | POA: Diagnosis not present

## 2016-10-11 DIAGNOSIS — I2510 Atherosclerotic heart disease of native coronary artery without angina pectoris: Secondary | ICD-10-CM | POA: Diagnosis not present

## 2016-10-11 DIAGNOSIS — R2689 Other abnormalities of gait and mobility: Secondary | ICD-10-CM | POA: Diagnosis not present

## 2016-10-11 DIAGNOSIS — D649 Anemia, unspecified: Secondary | ICD-10-CM | POA: Diagnosis not present

## 2016-10-11 DIAGNOSIS — N183 Chronic kidney disease, stage 3 (moderate): Secondary | ICD-10-CM | POA: Diagnosis not present

## 2016-10-11 DIAGNOSIS — Z95810 Presence of automatic (implantable) cardiac defibrillator: Secondary | ICD-10-CM | POA: Diagnosis not present

## 2016-10-11 DIAGNOSIS — M1380 Other specified arthritis, unspecified site: Secondary | ICD-10-CM | POA: Diagnosis not present

## 2016-10-11 DIAGNOSIS — M6281 Muscle weakness (generalized): Secondary | ICD-10-CM | POA: Diagnosis not present

## 2016-10-11 DIAGNOSIS — E1122 Type 2 diabetes mellitus with diabetic chronic kidney disease: Secondary | ICD-10-CM | POA: Diagnosis not present

## 2016-10-11 DIAGNOSIS — R262 Difficulty in walking, not elsewhere classified: Secondary | ICD-10-CM | POA: Diagnosis not present

## 2016-10-11 DIAGNOSIS — M25561 Pain in right knee: Secondary | ICD-10-CM | POA: Diagnosis not present

## 2016-10-12 DIAGNOSIS — I2510 Atherosclerotic heart disease of native coronary artery without angina pectoris: Secondary | ICD-10-CM | POA: Diagnosis not present

## 2016-10-12 DIAGNOSIS — M1380 Other specified arthritis, unspecified site: Secondary | ICD-10-CM | POA: Diagnosis not present

## 2016-10-12 DIAGNOSIS — M6281 Muscle weakness (generalized): Secondary | ICD-10-CM | POA: Diagnosis not present

## 2016-10-12 DIAGNOSIS — R262 Difficulty in walking, not elsewhere classified: Secondary | ICD-10-CM | POA: Diagnosis not present

## 2016-10-12 DIAGNOSIS — R2689 Other abnormalities of gait and mobility: Secondary | ICD-10-CM | POA: Diagnosis not present

## 2016-10-12 DIAGNOSIS — M25561 Pain in right knee: Secondary | ICD-10-CM | POA: Diagnosis not present

## 2016-10-12 DIAGNOSIS — Z95810 Presence of automatic (implantable) cardiac defibrillator: Secondary | ICD-10-CM | POA: Diagnosis not present

## 2016-10-12 DIAGNOSIS — I5022 Chronic systolic (congestive) heart failure: Secondary | ICD-10-CM | POA: Diagnosis not present

## 2016-10-12 DIAGNOSIS — N183 Chronic kidney disease, stage 3 (moderate): Secondary | ICD-10-CM | POA: Diagnosis not present

## 2016-10-12 DIAGNOSIS — D649 Anemia, unspecified: Secondary | ICD-10-CM | POA: Diagnosis not present

## 2016-10-12 DIAGNOSIS — E1122 Type 2 diabetes mellitus with diabetic chronic kidney disease: Secondary | ICD-10-CM | POA: Diagnosis not present

## 2016-10-13 DIAGNOSIS — D649 Anemia, unspecified: Secondary | ICD-10-CM | POA: Diagnosis not present

## 2016-10-13 DIAGNOSIS — M1380 Other specified arthritis, unspecified site: Secondary | ICD-10-CM | POA: Diagnosis not present

## 2016-10-13 DIAGNOSIS — I5022 Chronic systolic (congestive) heart failure: Secondary | ICD-10-CM | POA: Diagnosis not present

## 2016-10-13 DIAGNOSIS — Z95810 Presence of automatic (implantable) cardiac defibrillator: Secondary | ICD-10-CM | POA: Diagnosis not present

## 2016-10-13 DIAGNOSIS — R2689 Other abnormalities of gait and mobility: Secondary | ICD-10-CM | POA: Diagnosis not present

## 2016-10-13 DIAGNOSIS — R262 Difficulty in walking, not elsewhere classified: Secondary | ICD-10-CM | POA: Diagnosis not present

## 2016-10-13 DIAGNOSIS — I2510 Atherosclerotic heart disease of native coronary artery without angina pectoris: Secondary | ICD-10-CM | POA: Diagnosis not present

## 2016-10-13 DIAGNOSIS — E1122 Type 2 diabetes mellitus with diabetic chronic kidney disease: Secondary | ICD-10-CM | POA: Diagnosis not present

## 2016-10-13 DIAGNOSIS — M6281 Muscle weakness (generalized): Secondary | ICD-10-CM | POA: Diagnosis not present

## 2016-10-13 DIAGNOSIS — M25561 Pain in right knee: Secondary | ICD-10-CM | POA: Diagnosis not present

## 2016-10-13 DIAGNOSIS — N183 Chronic kidney disease, stage 3 (moderate): Secondary | ICD-10-CM | POA: Diagnosis not present

## 2016-10-14 ENCOUNTER — Emergency Department (HOSPITAL_COMMUNITY): Payer: Medicare Other

## 2016-10-14 ENCOUNTER — Emergency Department (HOSPITAL_COMMUNITY)
Admission: EM | Admit: 2016-10-14 | Discharge: 2016-10-14 | Disposition: A | Discharge status: Home / Self Care | Payer: Medicare Other | Attending: Emergency Medicine | Admitting: Emergency Medicine

## 2016-10-14 ENCOUNTER — Telehealth: Payer: Self-pay | Admitting: Cardiology

## 2016-10-14 ENCOUNTER — Encounter (HOSPITAL_COMMUNITY): Payer: Self-pay | Admitting: Emergency Medicine

## 2016-10-14 DIAGNOSIS — F0391 Unspecified dementia with behavioral disturbance: Secondary | ICD-10-CM | POA: Diagnosis not present

## 2016-10-14 DIAGNOSIS — Y929 Unspecified place or not applicable: Secondary | ICD-10-CM | POA: Diagnosis not present

## 2016-10-14 DIAGNOSIS — W19XXXA Unspecified fall, initial encounter: Secondary | ICD-10-CM

## 2016-10-14 DIAGNOSIS — S0990XA Unspecified injury of head, initial encounter: Secondary | ICD-10-CM | POA: Diagnosis not present

## 2016-10-14 DIAGNOSIS — M25551 Pain in right hip: Secondary | ICD-10-CM | POA: Diagnosis not present

## 2016-10-14 DIAGNOSIS — I2510 Atherosclerotic heart disease of native coronary artery without angina pectoris: Secondary | ICD-10-CM | POA: Insufficient documentation

## 2016-10-14 DIAGNOSIS — N183 Chronic kidney disease, stage 3 (moderate): Secondary | ICD-10-CM | POA: Diagnosis not present

## 2016-10-14 DIAGNOSIS — D649 Anemia, unspecified: Secondary | ICD-10-CM | POA: Diagnosis not present

## 2016-10-14 DIAGNOSIS — I130 Hypertensive heart and chronic kidney disease with heart failure and stage 1 through stage 4 chronic kidney disease, or unspecified chronic kidney disease: Secondary | ICD-10-CM | POA: Diagnosis not present

## 2016-10-14 DIAGNOSIS — R079 Chest pain, unspecified: Secondary | ICD-10-CM | POA: Diagnosis not present

## 2016-10-14 DIAGNOSIS — Z79899 Other long term (current) drug therapy: Secondary | ICD-10-CM | POA: Diagnosis not present

## 2016-10-14 DIAGNOSIS — M545 Low back pain: Secondary | ICD-10-CM | POA: Diagnosis not present

## 2016-10-14 DIAGNOSIS — R4182 Altered mental status, unspecified: Secondary | ICD-10-CM | POA: Diagnosis present

## 2016-10-14 DIAGNOSIS — Z471 Aftercare following joint replacement surgery: Secondary | ICD-10-CM | POA: Diagnosis not present

## 2016-10-14 DIAGNOSIS — S199XXA Unspecified injury of neck, initial encounter: Secondary | ICD-10-CM | POA: Diagnosis not present

## 2016-10-14 DIAGNOSIS — M546 Pain in thoracic spine: Secondary | ICD-10-CM | POA: Diagnosis not present

## 2016-10-14 DIAGNOSIS — Y939 Activity, unspecified: Secondary | ICD-10-CM | POA: Insufficient documentation

## 2016-10-14 DIAGNOSIS — R262 Difficulty in walking, not elsewhere classified: Secondary | ICD-10-CM | POA: Diagnosis not present

## 2016-10-14 DIAGNOSIS — Z7984 Long term (current) use of oral hypoglycemic drugs: Secondary | ICD-10-CM | POA: Insufficient documentation

## 2016-10-14 DIAGNOSIS — Z7901 Long term (current) use of anticoagulants: Secondary | ICD-10-CM | POA: Insufficient documentation

## 2016-10-14 DIAGNOSIS — E119 Type 2 diabetes mellitus without complications: Secondary | ICD-10-CM | POA: Insufficient documentation

## 2016-10-14 DIAGNOSIS — M6281 Muscle weakness (generalized): Secondary | ICD-10-CM | POA: Diagnosis not present

## 2016-10-14 DIAGNOSIS — M25561 Pain in right knee: Secondary | ICD-10-CM | POA: Diagnosis not present

## 2016-10-14 DIAGNOSIS — Y999 Unspecified external cause status: Secondary | ICD-10-CM | POA: Diagnosis not present

## 2016-10-14 DIAGNOSIS — Z96641 Presence of right artificial hip joint: Secondary | ICD-10-CM | POA: Diagnosis not present

## 2016-10-14 DIAGNOSIS — I5022 Chronic systolic (congestive) heart failure: Secondary | ICD-10-CM | POA: Insufficient documentation

## 2016-10-14 DIAGNOSIS — M1380 Other specified arthritis, unspecified site: Secondary | ICD-10-CM | POA: Diagnosis not present

## 2016-10-14 DIAGNOSIS — E1122 Type 2 diabetes mellitus with diabetic chronic kidney disease: Secondary | ICD-10-CM | POA: Diagnosis not present

## 2016-10-14 DIAGNOSIS — I493 Ventricular premature depolarization: Secondary | ICD-10-CM | POA: Diagnosis not present

## 2016-10-14 DIAGNOSIS — R2689 Other abnormalities of gait and mobility: Secondary | ICD-10-CM | POA: Diagnosis not present

## 2016-10-14 DIAGNOSIS — Z95810 Presence of automatic (implantable) cardiac defibrillator: Secondary | ICD-10-CM | POA: Diagnosis not present

## 2016-10-14 DIAGNOSIS — R410 Disorientation, unspecified: Secondary | ICD-10-CM | POA: Diagnosis not present

## 2016-10-14 LAB — I-STAT CHEM 8, ED
BUN: 38 mg/dL — AB (ref 6–20)
CALCIUM ION: 1.24 mmol/L (ref 1.15–1.40)
Chloride: 107 mmol/L (ref 101–111)
Creatinine, Ser: 1.2 mg/dL (ref 0.61–1.24)
Glucose, Bld: 116 mg/dL — ABNORMAL HIGH (ref 65–99)
HCT: 28 % — ABNORMAL LOW (ref 39.0–52.0)
Hemoglobin: 9.5 g/dL — ABNORMAL LOW (ref 13.0–17.0)
Potassium: 4.7 mmol/L (ref 3.5–5.1)
SODIUM: 142 mmol/L (ref 135–145)
TCO2: 22 mmol/L (ref 0–100)

## 2016-10-14 LAB — CBC WITH DIFFERENTIAL/PLATELET
Basophils Absolute: 0 10*3/uL (ref 0.0–0.1)
Basophils Relative: 1 %
Eosinophils Absolute: 0.2 10*3/uL (ref 0.0–0.7)
Eosinophils Relative: 4 %
HCT: 26.5 % — ABNORMAL LOW (ref 39.0–52.0)
Hemoglobin: 8.6 g/dL — ABNORMAL LOW (ref 13.0–17.0)
Lymphocytes Relative: 25 %
Lymphs Abs: 1.4 10*3/uL (ref 0.7–4.0)
MCH: 30.2 pg (ref 26.0–34.0)
MCHC: 32.5 g/dL (ref 30.0–36.0)
MCV: 93 fL (ref 78.0–100.0)
Monocytes Absolute: 0.6 10*3/uL (ref 0.1–1.0)
Monocytes Relative: 11 %
Neutro Abs: 3.3 10*3/uL (ref 1.7–7.7)
Neutrophils Relative %: 59 %
Platelets: 204 10*3/uL (ref 150–400)
RBC: 2.85 MIL/uL — ABNORMAL LOW (ref 4.22–5.81)
RDW: 17.7 % — ABNORMAL HIGH (ref 11.5–15.5)
WBC: 5.6 10*3/uL (ref 4.0–10.5)

## 2016-10-14 LAB — COMPREHENSIVE METABOLIC PANEL
ALT: 11 U/L — ABNORMAL LOW (ref 17–63)
AST: 17 U/L (ref 15–41)
Albumin: 4 g/dL (ref 3.5–5.0)
Alkaline Phosphatase: 49 U/L (ref 38–126)
Anion gap: 9 (ref 5–15)
BUN: 39 mg/dL — ABNORMAL HIGH (ref 6–20)
CO2: 23 mmol/L (ref 22–32)
Calcium: 9.8 mg/dL (ref 8.9–10.3)
Chloride: 109 mmol/L (ref 101–111)
Creatinine, Ser: 1.46 mg/dL — ABNORMAL HIGH (ref 0.61–1.24)
GFR calc Af Amer: 52 mL/min — ABNORMAL LOW (ref >60)
GFR calc non Af Amer: 45 mL/min — ABNORMAL LOW (ref >60)
Glucose, Bld: 118 mg/dL — ABNORMAL HIGH (ref 65–99)
Potassium: 4.7 mmol/L (ref 3.5–5.1)
Sodium: 141 mmol/L (ref 135–145)
Total Bilirubin: 0.8 mg/dL (ref 0.3–1.2)
Total Protein: 6.8 g/dL (ref 6.5–8.1)

## 2016-10-14 LAB — URINALYSIS, ROUTINE W REFLEX MICROSCOPIC
Bilirubin Urine: NEGATIVE
Glucose, UA: NEGATIVE mg/dL
Hgb urine dipstick: NEGATIVE
Ketones, ur: NEGATIVE mg/dL
Leukocytes, UA: NEGATIVE
Nitrite: NEGATIVE
Protein, ur: NEGATIVE mg/dL
Specific Gravity, Urine: 1.018 (ref 1.005–1.030)
pH: 5 (ref 5.0–8.0)

## 2016-10-14 LAB — I-STAT CG4 LACTIC ACID, ED: LACTIC ACID, VENOUS: 1.38 mmol/L (ref 0.5–1.9)

## 2016-10-14 LAB — I-STAT TROPONIN, ED: Troponin i, poc: 0.01 ng/mL (ref 0.00–0.08)

## 2016-10-14 LAB — POC OCCULT BLOOD, ED: Fecal Occult Bld: NEGATIVE

## 2016-10-14 NOTE — ED Provider Notes (Signed)
Received signout at the beginning of shift. Patient with increased agitation. Lives at a nursing facility. Sent here for evaluation altered mental status. He is demented and unable to give a good history however he is currently resting comfortably. Workup has been fairly unremarkable. Does have anemia with hemoglobin of 8.6, Hemoccult negative, no source of bleeding noted. He is mildly dehydrated IV fluid given. Imaging has been reviewed by me and shows no acute concerning feature. Currently patient is stable for discharge to return back to his facility.  BP (!) 134/56   Pulse 60   Temp 98.2 F (36.8 C) (Oral)   Resp (!) 9   Ht 5\' 10"  (1.778 m)   Wt 74.4 kg (164 lb)   SpO2 99%   BMI 23.53 kg/m   Results for orders placed or performed during the hospital encounter of 10/14/16  CBC with Differential  Result Value Ref Range   WBC 5.6 4.0 - 10.5 K/uL   RBC 2.85 (L) 4.22 - 5.81 MIL/uL   Hemoglobin 8.6 (L) 13.0 - 17.0 g/dL   HCT 96.0 (L) 45.4 - 09.8 %   MCV 93.0 78.0 - 100.0 fL   MCH 30.2 26.0 - 34.0 pg   MCHC 32.5 30.0 - 36.0 g/dL   RDW 11.9 (H) 14.7 - 82.9 %   Platelets 204 150 - 400 K/uL   Neutrophils Relative % 59 %   Neutro Abs 3.3 1.7 - 7.7 K/uL   Lymphocytes Relative 25 %   Lymphs Abs 1.4 0.7 - 4.0 K/uL   Monocytes Relative 11 %   Monocytes Absolute 0.6 0.1 - 1.0 K/uL   Eosinophils Relative 4 %   Eosinophils Absolute 0.2 0.0 - 0.7 K/uL   Basophils Relative 1 %   Basophils Absolute 0.0 0.0 - 0.1 K/uL  Comprehensive metabolic panel  Result Value Ref Range   Sodium 141 135 - 145 mmol/L   Potassium 4.7 3.5 - 5.1 mmol/L   Chloride 109 101 - 111 mmol/L   CO2 23 22 - 32 mmol/L   Glucose, Bld 118 (H) 65 - 99 mg/dL   BUN 39 (H) 6 - 20 mg/dL   Creatinine, Ser 5.62 (H) 0.61 - 1.24 mg/dL   Calcium 9.8 8.9 - 13.0 mg/dL   Total Protein 6.8 6.5 - 8.1 g/dL   Albumin 4.0 3.5 - 5.0 g/dL   AST 17 15 - 41 U/L   ALT 11 (L) 17 - 63 U/L   Alkaline Phosphatase 49 38 - 126 U/L   Total  Bilirubin 0.8 0.3 - 1.2 mg/dL   GFR calc non Af Amer 45 (L) >60 mL/min   GFR calc Af Amer 52 (L) >60 mL/min   Anion gap 9 5 - 15  Urinalysis, Routine w reflex microscopic  Result Value Ref Range   Color, Urine YELLOW YELLOW   APPearance CLEAR CLEAR   Specific Gravity, Urine 1.018 1.005 - 1.030   pH 5.0 5.0 - 8.0   Glucose, UA NEGATIVE NEGATIVE mg/dL   Hgb urine dipstick NEGATIVE NEGATIVE   Bilirubin Urine NEGATIVE NEGATIVE   Ketones, ur NEGATIVE NEGATIVE mg/dL   Protein, ur NEGATIVE NEGATIVE mg/dL   Nitrite NEGATIVE NEGATIVE   Leukocytes, UA NEGATIVE NEGATIVE  I-Stat CG4 Lactic Acid, ED  Result Value Ref Range   Lactic Acid, Venous 1.38 0.5 - 1.9 mmol/L  I-stat troponin, ED  Result Value Ref Range   Troponin i, poc 0.01 0.00 - 0.08 ng/mL   Comment 3  I-stat chem 8, ed  Result Value Ref Range   Sodium 142 135 - 145 mmol/L   Potassium 4.7 3.5 - 5.1 mmol/L   Chloride 107 101 - 111 mmol/L   BUN 38 (H) 6 - 20 mg/dL   Creatinine, Ser 5.37 0.61 - 1.24 mg/dL   Glucose, Bld 943 (H) 65 - 99 mg/dL   Calcium, Ion 2.76 1.47 - 1.40 mmol/L   TCO2 22 0 - 100 mmol/L   Hemoglobin 9.5 (L) 13.0 - 17.0 g/dL   HCT 09.2 (L) 95.7 - 47.3 %  POC occult blood, ED Provider will collect  Result Value Ref Range   Fecal Occult Bld NEGATIVE NEGATIVE   Dg Chest 1 View  Result Date: 10/14/2016 CLINICAL DATA:  76 year old male with chest pain. EXAM: CHEST 1 VIEW COMPARISON:  Chest radiograph dated 03/27/2016 FINDINGS: Stable slight elevation of the left hemidiaphragm. No focal consolidation, pleural effusion, or pneumothorax. The cardiac silhouette is within normal limits. There is atherosclerotic calcification of the aortic arch. Left pectoral AICD device. No acute osseous pathology. IMPRESSION: No active disease. Electronically Signed   By: Elgie Collard M.D.   On: 10/14/2016 06:04   Dg Thoracic Spine 2 View  Result Date: 10/14/2016 CLINICAL DATA:  76 year old male with fall and back pain.  EXAM: THORACIC SPINE 2 VIEWS COMPARISON:  Chest radiograph dated 10/14/2016 FINDINGS: There is no acute fracture or subluxation of the thoracic spine. There is osteopenia with multilevel degenerative changes. The visualized posterior elements appear intact. There is atherosclerotic calcification of the thoracic aorta. Partially visualized left pectoral AICD device wires. IMPRESSION: No acute/ traumatic thoracic spine pathology. Electronically Signed   By: Elgie Collard M.D.   On: 10/14/2016 06:07   Dg Lumbar Spine Complete  Result Date: 10/14/2016 CLINICAL DATA:  76 year old male with fall and back pain. EXAM: LUMBAR SPINE - COMPLETE 4+ VIEW COMPARISON:  Radiograph dated 03/27/2016 FINDINGS: There is no acute fracture or dislocation. There multilevel degenerative changes primarily involving L3-L4, L4-5, and L5-S1. There is disc desiccation with vacuum phenomena at L3-L4 and L4-L5. The visualized posterior elements appear intact. Multilevel facet hypertrophy noted. There is atherosclerotic calcification of the abdominal aorta. Partially visualized right hip arthroplasty. The soft tissues appear unremarkable. IMPRESSION: No acute/traumatic lumbar spine pathology. Electronically Signed   By: Elgie Collard M.D.   On: 10/14/2016 06:06   Ct Head Wo Contrast  Result Date: 10/14/2016 CLINICAL DATA:  76 year old male with fall. EXAM: CT HEAD WITHOUT CONTRAST CT CERVICAL SPINE WITHOUT CONTRAST TECHNIQUE: Multidetector CT imaging of the head and cervical spine was performed following the standard protocol without intravenous contrast. Multiplanar CT image reconstructions of the cervical spine were also generated. COMPARISON:  Head CT dated 03/27/2016 FINDINGS: CT HEAD FINDINGS Evaluation of this exam is limited due to motion artifact. 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 There is age-related atrophy and chronic microvascular ischemic  changes. No acute intracranial hemorrhage. No mass effect or midline shift noted. No extra-axial fluid collection. Vascular: There is atherosclerotic calcification of the left vertebral artery at the foramen magnum as well as atherosclerotic calcification of the cavernous ICA, left greater right. Skull: Normal. Negative for fracture or focal lesion. Sinuses/Orbits: There is mild mucoperiosteal thickening of the paranasal sinuses. No air-fluid levels. The mastoid air cells are clear. Other: None CT CERVICAL SPINE FINDINGS Alignment: No acute subluxation. Skull base and vertebrae: No acute fracture. The bones are osteopenic. C2-C5 anterior fusion.  Soft tissues and spinal canal: No prevertebral fluid or swelling. No visible canal hematoma. Disc levels: C2-C5 disc spacer and anterior fusion. Degenerative changes at C6-C7 and C7-T1. Upper chest: The visualized upper lungs are clear. An accessory azygos fissure is noted. Other: Bilateral carotid bulb atherosclerotic plaques. IMPRESSION: 1. No acute intracranial hemorrhage. 2. 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 3. No acute/traumatic cervical spine pathology. Multilevel degenerative changes and anterior fusion at C2-C5. Electronically Signed   By: Elgie Collard M.D.   On: 10/14/2016 06:25   Ct Cervical Spine Wo Contrast  Result Date: 10/14/2016 CLINICAL DATA:  76 year old male with fall. EXAM: CT HEAD WITHOUT CONTRAST CT CERVICAL SPINE WITHOUT CONTRAST TECHNIQUE: Multidetector CT imaging of the head and cervical spine was performed following the standard protocol without intravenous contrast. Multiplanar CT image reconstructions of the cervical spine were also generated. COMPARISON:  Head CT dated 03/27/2016 FINDINGS: CT HEAD FINDINGS Evaluation of this exam is limited due to motion artifact. 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 There is  age-related atrophy and chronic microvascular ischemic changes. No acute intracranial hemorrhage. No mass effect or midline shift noted. No extra-axial fluid collection. Vascular: There is atherosclerotic calcification of the left vertebral artery at the foramen magnum as well as atherosclerotic calcification of the cavernous ICA, left greater right. Skull: Normal. Negative for fracture or focal lesion. Sinuses/Orbits: There is mild mucoperiosteal thickening of the paranasal sinuses. No air-fluid levels. The mastoid air cells are clear. Other: None CT CERVICAL SPINE FINDINGS Alignment: No acute subluxation. Skull base and vertebrae: No acute fracture. The bones are osteopenic. C2-C5 anterior fusion. Soft tissues and spinal canal: No prevertebral fluid or swelling. No visible canal hematoma. Disc levels: C2-C5 disc spacer and anterior fusion. Degenerative changes at C6-C7 and C7-T1. Upper chest: The visualized upper lungs are clear. An accessory azygos fissure is noted. Other: Bilateral carotid bulb atherosclerotic plaques. IMPRESSION: 1. No acute intracranial hemorrhage. 2. 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 3. No acute/traumatic cervical spine pathology. Multilevel degenerative changes and anterior fusion at C2-C5. Electronically Signed   By: Elgie Collard M.D.   On: 10/14/2016 06:25   Dg Hip Unilat W Or Wo Pelvis 2-3 Views Right  Result Date: 10/14/2016 CLINICAL DATA:  76 year old male with fall and right hip pain. 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 No evidence of loosening. No acute fracture or dislocation. The bones are osteopenic. There are degenerative changes of the lower lumbar spine as well as chronic changes of the SI joints. The soft tissues appear unremarkable. IMPRESSION: Negative.  Electronically Signed   By: Elgie Collard M.D.   On: 10/14/2016 06:03      Fayrene Helper, PA-C 10/14/16 1610    Jacalyn Lefevre, MD 10/14/16 1055

## 2016-10-14 NOTE — ED Notes (Signed)
Pt refusing to cooperate for orthostatic vital signs

## 2016-10-14 NOTE — ED Notes (Signed)
Patient transported to CT 

## 2016-10-14 NOTE — ED Notes (Signed)
[facility] called for pt 

## 2016-10-14 NOTE — Telephone Encounter (Signed)
Spoke w/ receptionist and requested that patient nurse call back in regards to patient home monitor. Informed receptionist that the office is closing today at 12 Noon and will be open again on Monday around 8 AM.

## 2016-10-14 NOTE — ED Provider Notes (Signed)
[facility]-EMERGENCY DEPT Provider Note   CSN: [id number] Arrival date & time: 10/14/16  0346     History   Chief Complaint Chief Complaint  Patient presents with  . Altered Mental Status  . Hip Pain    HPI [name] is a 76 y.o. male with a hx of numerous medical problems including Coronary artery disease, Sherrilyn Rist myopathy, chronic kidney disease, dementia, chronic back pain due to stenosis of the spine, ischemic cardiomyopathy, ICD, stroke, encephalopathy presents to the Emergency Department via EMS with facility complains of increasing altered mental status over the last several days.   Level 5 Caveat for Dementia and AMS.    Per EMS and progress notes from [facility], patient has had increasing agitation over the last 3 days with increased yelling. Staff members obtained order for Ativan but patient refused to take this. Tonight patient had a verbal altercation with staff members prompting [employer] to respond. Patient was yelling at staff. Nursing notes report that patient rolled himself to his room in his wheelchair, refused assistance from staff to be assisted to bed and fell onto the floor landing on his right hip when he attempted to transfer from his wheelchair to his bed. They report patient has been combative throughout the entire evening.  The history is provided by the patient, medical records and the EMS personnel. The history is limited by the absence of a caregiver and the condition of the patient. No language interpreter was used.  Altered Mental Status    Hip Pain     Past Medical History:  Diagnosis Date  . Acute encephalopathy   . AICD (automatic cardioverter/defibrillator) present   . Anemia   . Arthritis    "all over" (05/16/2013)  . Atrial flutter (HCC)   . CAD (coronary artery disease)   . Cardiomyopathy (HCC)    a. 10/2012 Echo: EF 20-25%, diff HK, mod dil LA/RV/RA.  Marland Kitchen Cervical myelopathy (HCC)   . Cervical spinal stenosis   . Chronic systolic congestive  heart failure (HCC)   . CKD (chronic kidney disease), stage III   . 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 CVA (cerebral vascular accident) (HCC)   . Decreased vision of right eye   . Dementia   . Depression    "son died in service in 08-07-1990; still bothers me" (05/16/2013)  . DVT (deep venous thrombosis) (HCC) 10/2011   RUE; pt denies this hx on 05/16/2013  . Dysrhythmia   . Frequent falls   . GERD (gastroesophageal reflux disease)   . H/O hiatal hernia   . Hyperlipidemia   . Hypertension   . Hypoglycemia   . Lumbar spinal stenosis   . Mononeuritis of unspecified site   . Myocardial infarction (HCC)   . Pneumonia 1960   , also 2014  . Quadriparesis (HCC) 08-07-11   pre op  . Shortness of breath dyspnea    with exertion  . Type II diabetes mellitus (HCC)    type 2  . Venous insufficiency     Patient Active Problem List   Diagnosis Date Noted  . Dementia with behavioral disturbance 03/29/2016  . CKD (chronic kidney disease) stage 3, GFR 30-59 ml/min 03/28/2016  . Diabetes (HCC) 03/27/2016  . AKI (acute kidney injury) (HCC) 03/27/2016  . Adjustment disorder with depressed mood 12/25/2015  . Memory deficits 12/01/2015  . Encephalopathy, metabolic 11/28/2015  . Acute left PCA stroke (HCC)  11/25/2015  . Wound dehiscence 03/03/2015  . Lumbar stenosis with neurogenic claudication 02/02/2015  . Cervical spondylosis with myelopathy 04/17/2014  . ICD (implantable cardioverter-defibrillator), biventricular, in situ 11/19/2013  . Cardiac resynchronization therapy defibrillator (CRT-D) in place 07/03/2013  . Chronic renal failure 01/24/2013  . CAD (coronary artery disease) 12/10/2012  . Chronic systolic heart failure (HCC) 11/20/2012  . Cardiomyopathy, ischemic 11/20/2012  . Atrial flutter (HCC) 11/08/2012  . Dilated cardiomyopathy (HCC) 11/01/2012  . Cervical stenosis of spine 10/28/2011  . Spondylosis of  cervical joint 10/28/2011  . Cervical myelopathy (HCC) 10/28/2011  . Quadriparesis (HCC) 10/28/2011  . DVT of right proximal brachial through the distal axillary vein, acute 10/06/2011  . Constipation due to pain medication 10/06/2011  . Venous insufficiency 10/05/2011  . 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 Hyperlipidemia 10/05/2011  . Normocytic anemia 10/03/2011  . Falls frequently 10/03/2011  . Diabetes mellitus with chronic kidney disease (HCC) 10/03/2011  . HTN (hypertension) 10/03/2011  . Gait abnormality secondary to lumbar spinal stenosis 10/03/2011  . Arthritis 10/03/2011    Past Surgical History:  Procedure Laterality Date  . ANTERIOR CERVICAL DECOMP/DISCECTOMY FUSION  10/31/2011   Procedure: ANTERIOR CERVICAL DECOMPRESSION/DISCECTOMY FUSION 2 LEVELS;  Surgeon: Cristi Loron, MD;  Location: [facility];  Service: Neurosurgery;  Laterality: N/A;  Cervical Four-Five Cervical Five-Six Anterior Cervical Decompression with fusion interbody prothesis plating and bonegraft  . ANTERIOR CERVICAL DECOMP/DISCECTOMY FUSION N/A 04/17/2014   Procedure: CERVICAL THREE TO FOUR ANTERIOR CERVICAL DECOMPRESSION/DISCECTOMY FUSION 1 LEVEL/HARDWARE REMOVAL;  Surgeon: Tressie Stalker, MD;  Location: [facility];  Service: Neurosurgery;  Laterality: N/A;  C34 anterior cervical decompression with fusion interbody prosthesis plating and bonegraft with exploration of prev fusion and possible removal of old hardware  . BI-VENTRICULAR IMPLANTABLE CARDIOVERTER DEFIBRILLATOR N/A 05/16/2013   Procedure: BI-VENTRICULAR IMPLANTABLE CARDIOVERTER DEFIBRILLATOR  (CRT-D);  Surgeon: Marinus Maw, MD;  Location: [facility] CATH LAB;  Service: Cardiovascular;  Laterality: N/A;  . BI-VENTRICULAR IMPLANTABLE CARDIOVERTER DEFIBRILLATOR  (CRT-D) Left 05/16/2013   STJ CRTD implanted by Dr Ladona Ridgel for primary prevention/CHF  . CARDIAC CATHETERIZATION   10/2012  . CARPAL TUNNEL RELEASE Right ~ 2013  . CATARACT EXTRACTION W/ INTRAOCULAR LENS IMPLANT Bilateral   . JOINT REPLACEMENT  2013  . LEFT AND RIGHT HEART CATHETERIZATION WITH CORONARY ANGIOGRAM N/A 11/06/2012   Procedure: LEFT AND RIGHT HEART CATHETERIZATION WITH CORONARY ANGIOGRAM;  Surgeon: Laurey Morale, MD;  Location: [facility] CATH LAB;  Service: Cardiovascular;  Laterality: N/A;  . LUMBAR LAMINECTOMY/DECOMPRESSION MICRODISCECTOMY N/A 02/02/2015   Procedure: LUMBAR LAMINECTOMY/DECOMPRESSION MICRODISCECTOMY 2 LEVELS;  Surgeon: Tressie Stalker, MD;  Location: [facility];  Service: Neurosurgery;  Laterality: N/A;  L34 L45 laminectomy and foraminotomy  . LUMBAR WOUND DEBRIDEMENT N/A 03/04/2015   Procedure: Incision and Drainage of LUMBAR WOUND ;  Surgeon: Tressie Stalker, MD;  Location: [facility];  Service: Neurosurgery;  Laterality: N/A;  . 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 toe removal  Left    3rd and 4th toe removed  . TOTAL HIP ARTHROPLASTY Right ~ 2012       Home Medications    Prior to Admission medications   Medication Sig Start Date End Date Taking? Authorizing Provider  Calcium Carbonate Antacid (TUMS PO) Take 1 tablet by mouth 2 (two) times daily as needed (indigestion).   Yes [provider]  carvedilol (COREG) 6.25 MG tablet Take 1 tablet (  6.25 mg total) by mouth 2 (two) times daily. 09/05/16  Yes Marinus Maw, MD  fluticasone [facility]) 50 MCG/ACT nasal spray Place 2 sprays into both nostrils daily as needed for allergies. 12/16/15  Yes Sharon Seller, NP  gabapentin (NEURONTIN) 100 MG capsule Take 100 mg by mouth 2 (two) times daily.   Yes [provider]  hydrALAZINE (APRESOLINE) 25 MG tablet TAKE ONE-HALF TABLET BY MOUTH THREE TIMES DAILY Patient taking differently: Take 12.5 mg by mouth 3 (three) times daily.  12/16/15  Yes Sharon Seller, NP  isosorbide mononitrate (IMDUR) 30 MG 24 hr tablet Take 1 tablet (30 mg  total) by mouth daily. 12/16/15  Yes Sharon Seller, NP  ivabradine (CORLANOR) 5 MG TABS tablet Take 1 tablet (5 mg total) by mouth 2 (two) times daily with a meal. 12/16/15  Yes Sharon Seller, NP  magnesium hydroxide (MILK OF MAGNESIA) 400 MG/5ML suspension Take 30 mLs by mouth daily as needed. Patient taking differently: Take 30 mLs by mouth daily as needed for mild constipation.  12/16/15  Yes Sharon Seller, NP  menthol-zinc oxide (GOLD BOND) powder Apply 1 application topically 2 (two) times daily as needed (jock itch).   Yes [provider]  metFORMIN (GLUCOPHAGE) 1000 MG tablet Take 1,000 mg by mouth 2 (two) times daily with a meal.   Yes [provider]  QUEtiapine (SEROQUEL) 25 MG tablet Take 75 mg by mouth at bedtime.   Yes [provider]  Rivaroxaban (XARELTO) 15 MG TABS tablet Take 1 tablet (15 mg total) by mouth daily with supper. 12/16/15  Yes Sharon Seller, NP  rosuvastatin (CRESTOR) 10 MG tablet Take 1 tablet (10 mg total) by mouth daily at 6 PM. 12/16/15  Yes Eubanks, Janene Harvey, NP  silver sulfADIAZINE (SILVADENE) 1 % cream Apply 1 application topically daily as needed (wound care).   Yes [provider]  sodium chloride (DEEP SEA NASAL SPRAY) 0.65 % nasal spray Place 2 sprays into the nose every 2 (two) hours as needed for congestion. 12/16/15  Yes Sharon Seller, NP  traMADol (ULTRAM) 50 MG tablet Take 50 mg by mouth every 8 (eight) hours as needed for moderate pain.   Yes [provider]  UNABLE TO FIND Take 120 mLs by mouth 3 (three) times daily. Med Name: MedPass   Yes [provider]  traZODone (DESYREL) 50 MG tablet Take 1 tablet (50 mg total) by mouth at bedtime as needed for sleep. Patient not taking: Reported on 10/14/2016 12/25/15   Charm Rings, NP    Family History Family History  Problem Relation Age of Onset  . Diabetes Brother   . Cancer Father        Lung cancer  . Heart attack Mother         pt thinks she had MI  . Cancer Mother     Social History Social History  Substance Use Topics  . Smoking status: Never Smoker  . Smokeless tobacco: Never Used  . Alcohol use No     Comment: 05/16/2013 "used to drink a little bit a long time ago; nothing in over 30 yrs"     Allergies   Acyclovir and related; Dristan cold [chlorphen-pe-acetaminophen]; and Prednisone   Review of Systems Review of Systems  Unable to perform ROS: Dementia     Physical Exam Updated Vital Signs BP (!) 147/78   Pulse 71   Temp 98.2 F (36.8 C) (Oral)  Ht 5\' 10"  (1.778 m)   Wt 74.4 kg (164 lb)   SpO2 98%   BMI 23.53 kg/m   Physical Exam  Constitutional: He appears well-developed and well-nourished.  HENT:  Head: Normocephalic and atraumatic.  Eyes: Pupils are equal, round, and reactive to light. No scleral icterus.  Neck: Normal range of motion. No spinous process tenderness and no muscular tenderness present.  Cardiovascular: Normal rate and intact distal pulses.   Pulmonary/Chest: Effort normal. No respiratory distress. He has no wheezes. He exhibits no tenderness.  No contusions, ecchymosis or flail segment. Equal chest rise AICD in left upper chest  Abdominal: Soft. He exhibits no distension. There is no tenderness.  No contusions, ecchymosis Soft and nontender  Genitourinary:  Genitourinary Comments: Chaperone present  [employer] on DRE  Musculoskeletal: He exhibits edema (1+ pitting of the BLE).  Patient moving all 4 extremities Tenderness to palpation along the midline T-spine and L-spine. Mild TTP along the right trochanter Pelvis stable  Lymphadenopathy:    He has no cervical adenopathy.  Neurological: He is alert. He has normal strength. He is disoriented. No cranial nerve deficit or sensory deficit. GCS eye subscore is 4. GCS verbal subscore is 4. GCS motor subscore is 6.  Strength 4/5 including grip strenghth  Skin: Skin is warm and dry.  Psychiatric: His affect  is not angry and not labile. He is not agitated, not aggressive and not combative. Cognition and memory are impaired.     ED Treatments / Results  Labs (all labs ordered are listed, but only abnormal results are displayed) Labs Reviewed  CBC WITH DIFFERENTIAL/PLATELET - Abnormal; Notable for the following:       Result Value   RBC 2.85 (*)    Hemoglobin 8.6 (*)    HCT 26.5 (*)    RDW 17.7 (*)    All other components within normal limits  COMPREHENSIVE METABOLIC PANEL - Abnormal; Notable for the following:    Glucose, Bld 118 (*)    BUN 39 (*)    Creatinine, Ser 1.46 (*)    ALT 11 (*)    GFR calc non Af Amer 45 (*)    GFR calc Af Amer 52 (*)    All other components within normal limits  I-STAT CHEM 8, ED - Abnormal; Notable for the following:    BUN 38 (*)    Glucose, Bld 116 (*)    Hemoglobin 9.5 (*)    HCT 28.0 (*)    All other components within normal limits  URINALYSIS, ROUTINE W REFLEX MICROSCOPIC  I-STAT CG4 LACTIC ACID, ED  I-STAT TROPOININ, ED  Rosezena Sensor, ED  POC OCCULT BLOOD, ED    Radiology Dg Chest 1 View  Result Date: 10/14/2016 CLINICAL DATA:  76 year old male with chest pain. EXAM: CHEST 1 VIEW COMPARISON:  Chest radiograph dated 03/27/2016 FINDINGS: Stable slight elevation of the left hemidiaphragm. No focal consolidation, pleural effusion, or pneumothorax. The cardiac silhouette is within normal limits. There is atherosclerotic calcification of the aortic arch. Left pectoral AICD device. No acute osseous pathology. IMPRESSION: No active disease. Electronically Signed   By: Elgie Collard M.D.   On: 10/14/2016 06:04   Dg Thoracic Spine 2 View  Result Date: 10/14/2016 CLINICAL DATA:  76 year old male with fall and back pain. EXAM: THORACIC SPINE 2 VIEWS COMPARISON:  Chest radiograph dated 10/14/2016 FINDINGS: There is no acute fracture or subluxation of the thoracic spine. There is osteopenia with multilevel degenerative changes. The visualized  posterior elements  appear intact. There is atherosclerotic calcification of the thoracic aorta. Partially visualized left pectoral AICD device wires. IMPRESSION: No acute/ traumatic thoracic spine pathology. Electronically Signed   By: Elgie Collard M.D.   On: 10/14/2016 06:07   Dg Lumbar Spine Complete  Result Date: 10/14/2016 CLINICAL DATA:  76 year old male with fall and back pain. EXAM: LUMBAR SPINE - COMPLETE 4+ VIEW COMPARISON:  Radiograph dated 03/27/2016 FINDINGS: There is no acute fracture or dislocation. There multilevel degenerative changes primarily involving L3-L4, L4-5, and L5-S1. There is disc desiccation with vacuum phenomena at L3-L4 and L4-L5. The visualized posterior elements appear intact. Multilevel facet hypertrophy noted. There is atherosclerotic calcification of the abdominal aorta. Partially visualized right hip arthroplasty. The soft tissues appear unremarkable. IMPRESSION: No acute/traumatic lumbar spine pathology. Electronically Signed   By: Elgie Collard M.D.   On: 10/14/2016 06:06   Dg Hip Unilat W Or Wo Pelvis 2-3 Views Right  Result Date: 10/14/2016 CLINICAL DATA:  76 year old male with fall and right hip pain. 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 No evidence of loosening. No acute fracture or dislocation. The bones are osteopenic. There are degenerative changes of the lower lumbar spine as well as chronic changes of the SI joints. The soft tissues appear unremarkable. IMPRESSION: Negative. Electronically Signed   By: Elgie Collard M.D.   On: 10/14/2016 06:03    Procedures Procedures (including critical care time)  Medications Ordered in ED Medications - No data to display   Initial Impression / Assessment and Plan / ED Course  I have reviewed the triage vital signs and the nursing notes.  Pertinent labs &  imaging results that were available during my care of the patient were reviewed by me and considered in my medical decision making (see chart for details).     Pt presents with reports of increasing agitation and altered mental status from SNF.  Pt is pleasantly confused on my exam.  He c/o right hip pain and back pain.  Pt with drop in hgb since labs were drawn approx 1 year ago.  DRE with brown stool.    6:26 AM Labs, UA and imaging pending  At shift change, care transferred to Fayrene Helper, PA-C who will follow pending studies, reassess and determine disposition.    Final Clinical Impressions(s) / ED Diagnoses   Final diagnoses:  Dementia with behavioral disturbance, unspecified dementia type  Fall, initial encounter  Right hip pain    New Prescriptions New Prescriptions   No medications on file     Milta Deiters 10/14/16 2440    Shon Baton, MD 10/14/16 [phone]

## 2016-10-14 NOTE — ED Triage Notes (Signed)
Pt in from [facility] via [facility] with reported fall tonight, landing on R hip. No obvious deformity noted, pt also c/o generalized back pain (has chronically). Per EMS, police were on scene when they arrived at facility. Staff reported that pt had gotten very agitated and screamed out racist comments, refused PRN ativan and staff help back to bed. Per staff note, pt fell while refusing transfer help from wheelchair to bed. VSS, NAD, awake but oriented to self only. Hx of dementia

## 2016-10-14 NOTE — Discharge Instructions (Signed)
1. Medications: usual home medications 2. Treatment: rest, drink plenty of fluids,  3. Follow Up: Please followup with your primary doctor in 2-3 days for discussion of your diagnoses and further evaluation after today's visit; if you do not have a primary care doctor use the resource guide provided to find one; Please return to the ER for worsening symptoms or additional falls.

## 2016-10-17 DIAGNOSIS — I2510 Atherosclerotic heart disease of native coronary artery without angina pectoris: Secondary | ICD-10-CM | POA: Diagnosis not present

## 2016-10-17 DIAGNOSIS — I5022 Chronic systolic (congestive) heart failure: Secondary | ICD-10-CM | POA: Diagnosis not present

## 2016-10-17 DIAGNOSIS — D649 Anemia, unspecified: Secondary | ICD-10-CM | POA: Diagnosis not present

## 2016-10-17 DIAGNOSIS — R262 Difficulty in walking, not elsewhere classified: Secondary | ICD-10-CM | POA: Diagnosis not present

## 2016-10-17 DIAGNOSIS — M6281 Muscle weakness (generalized): Secondary | ICD-10-CM | POA: Diagnosis not present

## 2016-10-17 DIAGNOSIS — E1122 Type 2 diabetes mellitus with diabetic chronic kidney disease: Secondary | ICD-10-CM | POA: Diagnosis not present

## 2016-10-17 DIAGNOSIS — R2689 Other abnormalities of gait and mobility: Secondary | ICD-10-CM | POA: Diagnosis not present

## 2016-10-17 DIAGNOSIS — Z95810 Presence of automatic (implantable) cardiac defibrillator: Secondary | ICD-10-CM | POA: Diagnosis not present

## 2016-10-17 DIAGNOSIS — N183 Chronic kidney disease, stage 3 (moderate): Secondary | ICD-10-CM | POA: Diagnosis not present

## 2016-10-17 DIAGNOSIS — M25561 Pain in right knee: Secondary | ICD-10-CM | POA: Diagnosis not present

## 2016-10-17 DIAGNOSIS — M1380 Other specified arthritis, unspecified site: Secondary | ICD-10-CM | POA: Diagnosis not present

## 2016-10-18 DIAGNOSIS — D649 Anemia, unspecified: Secondary | ICD-10-CM | POA: Diagnosis not present

## 2016-10-18 DIAGNOSIS — I2510 Atherosclerotic heart disease of native coronary artery without angina pectoris: Secondary | ICD-10-CM | POA: Diagnosis not present

## 2016-10-18 DIAGNOSIS — M25561 Pain in right knee: Secondary | ICD-10-CM | POA: Diagnosis not present

## 2016-10-18 DIAGNOSIS — R262 Difficulty in walking, not elsewhere classified: Secondary | ICD-10-CM | POA: Diagnosis not present

## 2016-10-18 DIAGNOSIS — R2689 Other abnormalities of gait and mobility: Secondary | ICD-10-CM | POA: Diagnosis not present

## 2016-10-18 DIAGNOSIS — N183 Chronic kidney disease, stage 3 (moderate): Secondary | ICD-10-CM | POA: Diagnosis not present

## 2016-10-18 DIAGNOSIS — Z95810 Presence of automatic (implantable) cardiac defibrillator: Secondary | ICD-10-CM | POA: Diagnosis not present

## 2016-10-18 DIAGNOSIS — I5022 Chronic systolic (congestive) heart failure: Secondary | ICD-10-CM | POA: Diagnosis not present

## 2016-10-18 DIAGNOSIS — E1122 Type 2 diabetes mellitus with diabetic chronic kidney disease: Secondary | ICD-10-CM | POA: Diagnosis not present

## 2016-10-18 DIAGNOSIS — M1380 Other specified arthritis, unspecified site: Secondary | ICD-10-CM | POA: Diagnosis not present

## 2016-10-18 DIAGNOSIS — M6281 Muscle weakness (generalized): Secondary | ICD-10-CM | POA: Diagnosis not present

## 2016-10-19 DIAGNOSIS — Z95810 Presence of automatic (implantable) cardiac defibrillator: Secondary | ICD-10-CM | POA: Diagnosis not present

## 2016-10-19 DIAGNOSIS — R2689 Other abnormalities of gait and mobility: Secondary | ICD-10-CM | POA: Diagnosis not present

## 2016-10-19 DIAGNOSIS — D649 Anemia, unspecified: Secondary | ICD-10-CM | POA: Diagnosis not present

## 2016-10-19 DIAGNOSIS — M25561 Pain in right knee: Secondary | ICD-10-CM | POA: Diagnosis not present

## 2016-10-19 DIAGNOSIS — M1380 Other specified arthritis, unspecified site: Secondary | ICD-10-CM | POA: Diagnosis not present

## 2016-10-19 DIAGNOSIS — N183 Chronic kidney disease, stage 3 (moderate): Secondary | ICD-10-CM | POA: Diagnosis not present

## 2016-10-19 DIAGNOSIS — R262 Difficulty in walking, not elsewhere classified: Secondary | ICD-10-CM | POA: Diagnosis not present

## 2016-10-19 DIAGNOSIS — E1122 Type 2 diabetes mellitus with diabetic chronic kidney disease: Secondary | ICD-10-CM | POA: Diagnosis not present

## 2016-10-19 DIAGNOSIS — I5022 Chronic systolic (congestive) heart failure: Secondary | ICD-10-CM | POA: Diagnosis not present

## 2016-10-19 DIAGNOSIS — I2510 Atherosclerotic heart disease of native coronary artery without angina pectoris: Secondary | ICD-10-CM | POA: Diagnosis not present

## 2016-10-19 DIAGNOSIS — M6281 Muscle weakness (generalized): Secondary | ICD-10-CM | POA: Diagnosis not present

## 2016-10-20 DIAGNOSIS — I2510 Atherosclerotic heart disease of native coronary artery without angina pectoris: Secondary | ICD-10-CM | POA: Diagnosis not present

## 2016-10-20 DIAGNOSIS — E1122 Type 2 diabetes mellitus with diabetic chronic kidney disease: Secondary | ICD-10-CM | POA: Diagnosis not present

## 2016-10-20 DIAGNOSIS — N183 Chronic kidney disease, stage 3 (moderate): Secondary | ICD-10-CM | POA: Diagnosis not present

## 2016-10-20 DIAGNOSIS — I5022 Chronic systolic (congestive) heart failure: Secondary | ICD-10-CM | POA: Diagnosis not present

## 2016-10-20 DIAGNOSIS — M25561 Pain in right knee: Secondary | ICD-10-CM | POA: Diagnosis not present

## 2016-10-20 DIAGNOSIS — Z95810 Presence of automatic (implantable) cardiac defibrillator: Secondary | ICD-10-CM | POA: Diagnosis not present

## 2016-10-20 DIAGNOSIS — R2689 Other abnormalities of gait and mobility: Secondary | ICD-10-CM | POA: Diagnosis not present

## 2016-10-20 DIAGNOSIS — M1380 Other specified arthritis, unspecified site: Secondary | ICD-10-CM | POA: Diagnosis not present

## 2016-10-20 DIAGNOSIS — M6281 Muscle weakness (generalized): Secondary | ICD-10-CM | POA: Diagnosis not present

## 2016-10-20 DIAGNOSIS — R262 Difficulty in walking, not elsewhere classified: Secondary | ICD-10-CM | POA: Diagnosis not present

## 2016-10-20 DIAGNOSIS — D649 Anemia, unspecified: Secondary | ICD-10-CM | POA: Diagnosis not present

## 2016-10-21 DIAGNOSIS — R262 Difficulty in walking, not elsewhere classified: Secondary | ICD-10-CM | POA: Diagnosis not present

## 2016-10-21 DIAGNOSIS — R2689 Other abnormalities of gait and mobility: Secondary | ICD-10-CM | POA: Diagnosis not present

## 2016-10-21 DIAGNOSIS — M25561 Pain in right knee: Secondary | ICD-10-CM | POA: Diagnosis not present

## 2016-10-21 DIAGNOSIS — M1380 Other specified arthritis, unspecified site: Secondary | ICD-10-CM | POA: Diagnosis not present

## 2016-10-21 DIAGNOSIS — M6281 Muscle weakness (generalized): Secondary | ICD-10-CM | POA: Diagnosis not present

## 2016-10-21 DIAGNOSIS — Z95810 Presence of automatic (implantable) cardiac defibrillator: Secondary | ICD-10-CM | POA: Diagnosis not present

## 2016-10-21 DIAGNOSIS — N183 Chronic kidney disease, stage 3 (moderate): Secondary | ICD-10-CM | POA: Diagnosis not present

## 2016-10-21 DIAGNOSIS — E1122 Type 2 diabetes mellitus with diabetic chronic kidney disease: Secondary | ICD-10-CM | POA: Diagnosis not present

## 2016-10-21 DIAGNOSIS — I5022 Chronic systolic (congestive) heart failure: Secondary | ICD-10-CM | POA: Diagnosis not present

## 2016-10-21 DIAGNOSIS — I2510 Atherosclerotic heart disease of native coronary artery without angina pectoris: Secondary | ICD-10-CM | POA: Diagnosis not present

## 2016-10-21 DIAGNOSIS — D649 Anemia, unspecified: Secondary | ICD-10-CM | POA: Diagnosis not present

## 2016-10-24 DIAGNOSIS — M25561 Pain in right knee: Secondary | ICD-10-CM | POA: Diagnosis not present

## 2016-10-24 DIAGNOSIS — M6281 Muscle weakness (generalized): Secondary | ICD-10-CM | POA: Diagnosis not present

## 2016-10-24 DIAGNOSIS — D649 Anemia, unspecified: Secondary | ICD-10-CM | POA: Diagnosis not present

## 2016-10-24 DIAGNOSIS — I2510 Atherosclerotic heart disease of native coronary artery without angina pectoris: Secondary | ICD-10-CM | POA: Diagnosis not present

## 2016-10-24 DIAGNOSIS — N183 Chronic kidney disease, stage 3 (moderate): Secondary | ICD-10-CM | POA: Diagnosis not present

## 2016-10-24 DIAGNOSIS — R2689 Other abnormalities of gait and mobility: Secondary | ICD-10-CM | POA: Diagnosis not present

## 2016-10-24 DIAGNOSIS — R262 Difficulty in walking, not elsewhere classified: Secondary | ICD-10-CM | POA: Diagnosis not present

## 2016-10-24 DIAGNOSIS — E1122 Type 2 diabetes mellitus with diabetic chronic kidney disease: Secondary | ICD-10-CM | POA: Diagnosis not present

## 2016-10-24 DIAGNOSIS — I5022 Chronic systolic (congestive) heart failure: Secondary | ICD-10-CM | POA: Diagnosis not present

## 2016-10-24 DIAGNOSIS — M1380 Other specified arthritis, unspecified site: Secondary | ICD-10-CM | POA: Diagnosis not present

## 2016-10-24 DIAGNOSIS — Z95810 Presence of automatic (implantable) cardiac defibrillator: Secondary | ICD-10-CM | POA: Diagnosis not present

## 2016-10-25 DIAGNOSIS — E1122 Type 2 diabetes mellitus with diabetic chronic kidney disease: Secondary | ICD-10-CM | POA: Diagnosis not present

## 2016-10-25 DIAGNOSIS — D649 Anemia, unspecified: Secondary | ICD-10-CM | POA: Diagnosis not present

## 2016-10-25 DIAGNOSIS — R2689 Other abnormalities of gait and mobility: Secondary | ICD-10-CM | POA: Diagnosis not present

## 2016-10-25 DIAGNOSIS — N183 Chronic kidney disease, stage 3 (moderate): Secondary | ICD-10-CM | POA: Diagnosis not present

## 2016-10-25 DIAGNOSIS — M1380 Other specified arthritis, unspecified site: Secondary | ICD-10-CM | POA: Diagnosis not present

## 2016-10-25 DIAGNOSIS — R262 Difficulty in walking, not elsewhere classified: Secondary | ICD-10-CM | POA: Diagnosis not present

## 2016-10-25 DIAGNOSIS — M25561 Pain in right knee: Secondary | ICD-10-CM | POA: Diagnosis not present

## 2016-10-25 DIAGNOSIS — I5022 Chronic systolic (congestive) heart failure: Secondary | ICD-10-CM | POA: Diagnosis not present

## 2016-10-25 DIAGNOSIS — Z95810 Presence of automatic (implantable) cardiac defibrillator: Secondary | ICD-10-CM | POA: Diagnosis not present

## 2016-10-25 DIAGNOSIS — I2510 Atherosclerotic heart disease of native coronary artery without angina pectoris: Secondary | ICD-10-CM | POA: Diagnosis not present

## 2016-10-25 DIAGNOSIS — M6281 Muscle weakness (generalized): Secondary | ICD-10-CM | POA: Diagnosis not present

## 2016-10-26 DIAGNOSIS — D649 Anemia, unspecified: Secondary | ICD-10-CM | POA: Diagnosis not present

## 2016-10-26 DIAGNOSIS — N183 Chronic kidney disease, stage 3 (moderate): Secondary | ICD-10-CM | POA: Diagnosis not present

## 2016-10-26 DIAGNOSIS — Z95810 Presence of automatic (implantable) cardiac defibrillator: Secondary | ICD-10-CM | POA: Diagnosis not present

## 2016-10-26 DIAGNOSIS — I5022 Chronic systolic (congestive) heart failure: Secondary | ICD-10-CM | POA: Diagnosis not present

## 2016-10-26 DIAGNOSIS — E1122 Type 2 diabetes mellitus with diabetic chronic kidney disease: Secondary | ICD-10-CM | POA: Diagnosis not present

## 2016-10-26 DIAGNOSIS — M25561 Pain in right knee: Secondary | ICD-10-CM | POA: Diagnosis not present

## 2016-10-26 DIAGNOSIS — R262 Difficulty in walking, not elsewhere classified: Secondary | ICD-10-CM | POA: Diagnosis not present

## 2016-10-26 DIAGNOSIS — R2689 Other abnormalities of gait and mobility: Secondary | ICD-10-CM | POA: Diagnosis not present

## 2016-10-26 DIAGNOSIS — I2510 Atherosclerotic heart disease of native coronary artery without angina pectoris: Secondary | ICD-10-CM | POA: Diagnosis not present

## 2016-10-26 DIAGNOSIS — M6281 Muscle weakness (generalized): Secondary | ICD-10-CM | POA: Diagnosis not present

## 2016-10-26 DIAGNOSIS — M1380 Other specified arthritis, unspecified site: Secondary | ICD-10-CM | POA: Diagnosis not present

## 2016-10-26 NOTE — Telephone Encounter (Signed)
Spoke w/ pt nurse and informed her that pt home monitor has not updated and we need pt to send a manual transmission. Pt nurse is going to call back later today.

## 2016-10-27 DIAGNOSIS — M25561 Pain in right knee: Secondary | ICD-10-CM | POA: Diagnosis not present

## 2016-10-27 DIAGNOSIS — R262 Difficulty in walking, not elsewhere classified: Secondary | ICD-10-CM | POA: Diagnosis not present

## 2016-10-27 DIAGNOSIS — N183 Chronic kidney disease, stage 3 (moderate): Secondary | ICD-10-CM | POA: Diagnosis not present

## 2016-10-27 DIAGNOSIS — M6281 Muscle weakness (generalized): Secondary | ICD-10-CM | POA: Diagnosis not present

## 2016-10-27 DIAGNOSIS — R2689 Other abnormalities of gait and mobility: Secondary | ICD-10-CM | POA: Diagnosis not present

## 2016-10-27 DIAGNOSIS — M1380 Other specified arthritis, unspecified site: Secondary | ICD-10-CM | POA: Diagnosis not present

## 2016-10-27 DIAGNOSIS — Z95810 Presence of automatic (implantable) cardiac defibrillator: Secondary | ICD-10-CM | POA: Diagnosis not present

## 2016-10-27 DIAGNOSIS — D649 Anemia, unspecified: Secondary | ICD-10-CM | POA: Diagnosis not present

## 2016-10-27 DIAGNOSIS — I5022 Chronic systolic (congestive) heart failure: Secondary | ICD-10-CM | POA: Diagnosis not present

## 2016-10-27 DIAGNOSIS — E1122 Type 2 diabetes mellitus with diabetic chronic kidney disease: Secondary | ICD-10-CM | POA: Diagnosis not present

## 2016-10-27 DIAGNOSIS — I2510 Atherosclerotic heart disease of native coronary artery without angina pectoris: Secondary | ICD-10-CM | POA: Diagnosis not present

## 2016-10-28 DIAGNOSIS — R2689 Other abnormalities of gait and mobility: Secondary | ICD-10-CM | POA: Diagnosis not present

## 2016-10-28 DIAGNOSIS — M25561 Pain in right knee: Secondary | ICD-10-CM | POA: Diagnosis not present

## 2016-10-28 DIAGNOSIS — E1122 Type 2 diabetes mellitus with diabetic chronic kidney disease: Secondary | ICD-10-CM | POA: Diagnosis not present

## 2016-10-28 DIAGNOSIS — M6281 Muscle weakness (generalized): Secondary | ICD-10-CM | POA: Diagnosis not present

## 2016-10-28 DIAGNOSIS — R262 Difficulty in walking, not elsewhere classified: Secondary | ICD-10-CM | POA: Diagnosis not present

## 2016-10-28 DIAGNOSIS — I5022 Chronic systolic (congestive) heart failure: Secondary | ICD-10-CM | POA: Diagnosis not present

## 2016-10-28 DIAGNOSIS — D649 Anemia, unspecified: Secondary | ICD-10-CM | POA: Diagnosis not present

## 2016-10-28 DIAGNOSIS — Z95810 Presence of automatic (implantable) cardiac defibrillator: Secondary | ICD-10-CM | POA: Diagnosis not present

## 2016-10-28 DIAGNOSIS — N183 Chronic kidney disease, stage 3 (moderate): Secondary | ICD-10-CM | POA: Diagnosis not present

## 2016-10-28 DIAGNOSIS — I2510 Atherosclerotic heart disease of native coronary artery without angina pectoris: Secondary | ICD-10-CM | POA: Diagnosis not present

## 2016-10-28 DIAGNOSIS — M1380 Other specified arthritis, unspecified site: Secondary | ICD-10-CM | POA: Diagnosis not present

## 2016-10-31 DIAGNOSIS — R262 Difficulty in walking, not elsewhere classified: Secondary | ICD-10-CM | POA: Diagnosis not present

## 2016-10-31 DIAGNOSIS — I2510 Atherosclerotic heart disease of native coronary artery without angina pectoris: Secondary | ICD-10-CM | POA: Diagnosis not present

## 2016-10-31 DIAGNOSIS — I5022 Chronic systolic (congestive) heart failure: Secondary | ICD-10-CM | POA: Diagnosis not present

## 2016-10-31 DIAGNOSIS — D649 Anemia, unspecified: Secondary | ICD-10-CM | POA: Diagnosis not present

## 2016-10-31 DIAGNOSIS — E1122 Type 2 diabetes mellitus with diabetic chronic kidney disease: Secondary | ICD-10-CM | POA: Diagnosis not present

## 2016-10-31 DIAGNOSIS — Z95810 Presence of automatic (implantable) cardiac defibrillator: Secondary | ICD-10-CM | POA: Diagnosis not present

## 2016-10-31 DIAGNOSIS — M25561 Pain in right knee: Secondary | ICD-10-CM | POA: Diagnosis not present

## 2016-10-31 DIAGNOSIS — M6281 Muscle weakness (generalized): Secondary | ICD-10-CM | POA: Diagnosis not present

## 2016-10-31 DIAGNOSIS — N183 Chronic kidney disease, stage 3 (moderate): Secondary | ICD-10-CM | POA: Diagnosis not present

## 2016-10-31 DIAGNOSIS — M1380 Other specified arthritis, unspecified site: Secondary | ICD-10-CM | POA: Diagnosis not present

## 2016-10-31 DIAGNOSIS — R2689 Other abnormalities of gait and mobility: Secondary | ICD-10-CM | POA: Diagnosis not present

## 2016-11-01 DIAGNOSIS — E1122 Type 2 diabetes mellitus with diabetic chronic kidney disease: Secondary | ICD-10-CM | POA: Diagnosis not present

## 2016-11-01 DIAGNOSIS — Z95810 Presence of automatic (implantable) cardiac defibrillator: Secondary | ICD-10-CM | POA: Diagnosis not present

## 2016-11-01 DIAGNOSIS — M6281 Muscle weakness (generalized): Secondary | ICD-10-CM | POA: Diagnosis not present

## 2016-11-01 DIAGNOSIS — R262 Difficulty in walking, not elsewhere classified: Secondary | ICD-10-CM | POA: Diagnosis not present

## 2016-11-01 DIAGNOSIS — R2689 Other abnormalities of gait and mobility: Secondary | ICD-10-CM | POA: Diagnosis not present

## 2016-11-01 DIAGNOSIS — D649 Anemia, unspecified: Secondary | ICD-10-CM | POA: Diagnosis not present

## 2016-11-01 DIAGNOSIS — I5022 Chronic systolic (congestive) heart failure: Secondary | ICD-10-CM | POA: Diagnosis not present

## 2016-11-01 DIAGNOSIS — M1380 Other specified arthritis, unspecified site: Secondary | ICD-10-CM | POA: Diagnosis not present

## 2016-11-01 DIAGNOSIS — N183 Chronic kidney disease, stage 3 (moderate): Secondary | ICD-10-CM | POA: Diagnosis not present

## 2016-11-01 DIAGNOSIS — M25561 Pain in right knee: Secondary | ICD-10-CM | POA: Diagnosis not present

## 2016-11-01 DIAGNOSIS — I2510 Atherosclerotic heart disease of native coronary artery without angina pectoris: Secondary | ICD-10-CM | POA: Diagnosis not present

## 2016-11-02 DIAGNOSIS — E1122 Type 2 diabetes mellitus with diabetic chronic kidney disease: Secondary | ICD-10-CM | POA: Diagnosis not present

## 2016-11-02 DIAGNOSIS — I5022 Chronic systolic (congestive) heart failure: Secondary | ICD-10-CM | POA: Diagnosis not present

## 2016-11-02 DIAGNOSIS — Z95810 Presence of automatic (implantable) cardiac defibrillator: Secondary | ICD-10-CM | POA: Diagnosis not present

## 2016-11-02 DIAGNOSIS — M25561 Pain in right knee: Secondary | ICD-10-CM | POA: Diagnosis not present

## 2016-11-02 DIAGNOSIS — D649 Anemia, unspecified: Secondary | ICD-10-CM | POA: Diagnosis not present

## 2016-11-02 DIAGNOSIS — R2681 Unsteadiness on feet: Secondary | ICD-10-CM | POA: Diagnosis not present

## 2016-11-02 DIAGNOSIS — R2689 Other abnormalities of gait and mobility: Secondary | ICD-10-CM | POA: Diagnosis not present

## 2016-11-02 DIAGNOSIS — N183 Chronic kidney disease, stage 3 (moderate): Secondary | ICD-10-CM | POA: Diagnosis not present

## 2016-11-02 DIAGNOSIS — M1380 Other specified arthritis, unspecified site: Secondary | ICD-10-CM | POA: Diagnosis not present

## 2016-11-02 DIAGNOSIS — M6281 Muscle weakness (generalized): Secondary | ICD-10-CM | POA: Diagnosis not present

## 2016-11-02 DIAGNOSIS — R262 Difficulty in walking, not elsewhere classified: Secondary | ICD-10-CM | POA: Diagnosis not present

## 2016-11-02 DIAGNOSIS — I2510 Atherosclerotic heart disease of native coronary artery without angina pectoris: Secondary | ICD-10-CM | POA: Diagnosis not present

## 2016-11-03 DIAGNOSIS — R2689 Other abnormalities of gait and mobility: Secondary | ICD-10-CM | POA: Diagnosis not present

## 2016-11-03 DIAGNOSIS — M6281 Muscle weakness (generalized): Secondary | ICD-10-CM | POA: Diagnosis not present

## 2016-11-03 DIAGNOSIS — I5022 Chronic systolic (congestive) heart failure: Secondary | ICD-10-CM | POA: Diagnosis not present

## 2016-11-03 DIAGNOSIS — N183 Chronic kidney disease, stage 3 (moderate): Secondary | ICD-10-CM | POA: Diagnosis not present

## 2016-11-03 DIAGNOSIS — E1122 Type 2 diabetes mellitus with diabetic chronic kidney disease: Secondary | ICD-10-CM | POA: Diagnosis not present

## 2016-11-03 DIAGNOSIS — D649 Anemia, unspecified: Secondary | ICD-10-CM | POA: Diagnosis not present

## 2016-11-03 DIAGNOSIS — M25561 Pain in right knee: Secondary | ICD-10-CM | POA: Diagnosis not present

## 2016-11-03 DIAGNOSIS — M1380 Other specified arthritis, unspecified site: Secondary | ICD-10-CM | POA: Diagnosis not present

## 2016-11-03 DIAGNOSIS — I2510 Atherosclerotic heart disease of native coronary artery without angina pectoris: Secondary | ICD-10-CM | POA: Diagnosis not present

## 2016-11-03 DIAGNOSIS — R2681 Unsteadiness on feet: Secondary | ICD-10-CM | POA: Diagnosis not present

## 2016-11-03 DIAGNOSIS — R262 Difficulty in walking, not elsewhere classified: Secondary | ICD-10-CM | POA: Diagnosis not present

## 2016-11-03 DIAGNOSIS — Z95810 Presence of automatic (implantable) cardiac defibrillator: Secondary | ICD-10-CM | POA: Diagnosis not present

## 2016-11-04 DIAGNOSIS — E1122 Type 2 diabetes mellitus with diabetic chronic kidney disease: Secondary | ICD-10-CM | POA: Diagnosis not present

## 2016-11-04 DIAGNOSIS — D649 Anemia, unspecified: Secondary | ICD-10-CM | POA: Diagnosis not present

## 2016-11-04 DIAGNOSIS — R2681 Unsteadiness on feet: Secondary | ICD-10-CM | POA: Diagnosis not present

## 2016-11-04 DIAGNOSIS — I5022 Chronic systolic (congestive) heart failure: Secondary | ICD-10-CM | POA: Diagnosis not present

## 2016-11-04 DIAGNOSIS — M25561 Pain in right knee: Secondary | ICD-10-CM | POA: Diagnosis not present

## 2016-11-04 DIAGNOSIS — N183 Chronic kidney disease, stage 3 (moderate): Secondary | ICD-10-CM | POA: Diagnosis not present

## 2016-11-04 DIAGNOSIS — R2689 Other abnormalities of gait and mobility: Secondary | ICD-10-CM | POA: Diagnosis not present

## 2016-11-04 DIAGNOSIS — R262 Difficulty in walking, not elsewhere classified: Secondary | ICD-10-CM | POA: Diagnosis not present

## 2016-11-04 DIAGNOSIS — I2510 Atherosclerotic heart disease of native coronary artery without angina pectoris: Secondary | ICD-10-CM | POA: Diagnosis not present

## 2016-11-04 DIAGNOSIS — M6281 Muscle weakness (generalized): Secondary | ICD-10-CM | POA: Diagnosis not present

## 2016-11-04 DIAGNOSIS — M1380 Other specified arthritis, unspecified site: Secondary | ICD-10-CM | POA: Diagnosis not present

## 2016-11-04 DIAGNOSIS — Z95810 Presence of automatic (implantable) cardiac defibrillator: Secondary | ICD-10-CM | POA: Diagnosis not present

## 2016-11-04 NOTE — Telephone Encounter (Signed)
Spoke w/ pt and requested that a nurse send a manual transmission b/c his home monitor has not updated since 09-20-2016. She stated that she would have a nurse go to his room and send a transmission. She is aware that if the the nurse needs me the nurse can call back.

## 2016-11-07 DIAGNOSIS — Z95810 Presence of automatic (implantable) cardiac defibrillator: Secondary | ICD-10-CM | POA: Diagnosis not present

## 2016-11-07 DIAGNOSIS — R262 Difficulty in walking, not elsewhere classified: Secondary | ICD-10-CM | POA: Diagnosis not present

## 2016-11-07 DIAGNOSIS — R2681 Unsteadiness on feet: Secondary | ICD-10-CM | POA: Diagnosis not present

## 2016-11-07 DIAGNOSIS — D649 Anemia, unspecified: Secondary | ICD-10-CM | POA: Diagnosis not present

## 2016-11-07 DIAGNOSIS — M6281 Muscle weakness (generalized): Secondary | ICD-10-CM | POA: Diagnosis not present

## 2016-11-07 DIAGNOSIS — E1122 Type 2 diabetes mellitus with diabetic chronic kidney disease: Secondary | ICD-10-CM | POA: Diagnosis not present

## 2016-11-07 DIAGNOSIS — I5022 Chronic systolic (congestive) heart failure: Secondary | ICD-10-CM | POA: Diagnosis not present

## 2016-11-07 DIAGNOSIS — M1380 Other specified arthritis, unspecified site: Secondary | ICD-10-CM | POA: Diagnosis not present

## 2016-11-07 DIAGNOSIS — N183 Chronic kidney disease, stage 3 (moderate): Secondary | ICD-10-CM | POA: Diagnosis not present

## 2016-11-07 DIAGNOSIS — R2689 Other abnormalities of gait and mobility: Secondary | ICD-10-CM | POA: Diagnosis not present

## 2016-11-07 DIAGNOSIS — M25561 Pain in right knee: Secondary | ICD-10-CM | POA: Diagnosis not present

## 2016-11-07 DIAGNOSIS — I2510 Atherosclerotic heart disease of native coronary artery without angina pectoris: Secondary | ICD-10-CM | POA: Diagnosis not present

## 2016-11-08 DIAGNOSIS — R262 Difficulty in walking, not elsewhere classified: Secondary | ICD-10-CM | POA: Diagnosis not present

## 2016-11-08 DIAGNOSIS — M25561 Pain in right knee: Secondary | ICD-10-CM | POA: Diagnosis not present

## 2016-11-08 DIAGNOSIS — E1122 Type 2 diabetes mellitus with diabetic chronic kidney disease: Secondary | ICD-10-CM | POA: Diagnosis not present

## 2016-11-08 DIAGNOSIS — M1380 Other specified arthritis, unspecified site: Secondary | ICD-10-CM | POA: Diagnosis not present

## 2016-11-08 DIAGNOSIS — I5022 Chronic systolic (congestive) heart failure: Secondary | ICD-10-CM | POA: Diagnosis not present

## 2016-11-08 DIAGNOSIS — I2510 Atherosclerotic heart disease of native coronary artery without angina pectoris: Secondary | ICD-10-CM | POA: Diagnosis not present

## 2016-11-08 DIAGNOSIS — R2689 Other abnormalities of gait and mobility: Secondary | ICD-10-CM | POA: Diagnosis not present

## 2016-11-08 DIAGNOSIS — R2681 Unsteadiness on feet: Secondary | ICD-10-CM | POA: Diagnosis not present

## 2016-11-08 DIAGNOSIS — D649 Anemia, unspecified: Secondary | ICD-10-CM | POA: Diagnosis not present

## 2016-11-08 DIAGNOSIS — Z95810 Presence of automatic (implantable) cardiac defibrillator: Secondary | ICD-10-CM | POA: Diagnosis not present

## 2016-11-08 DIAGNOSIS — M6281 Muscle weakness (generalized): Secondary | ICD-10-CM | POA: Diagnosis not present

## 2016-11-08 DIAGNOSIS — N183 Chronic kidney disease, stage 3 (moderate): Secondary | ICD-10-CM | POA: Diagnosis not present

## 2016-11-09 DIAGNOSIS — M25561 Pain in right knee: Secondary | ICD-10-CM | POA: Diagnosis not present

## 2016-11-09 DIAGNOSIS — R2689 Other abnormalities of gait and mobility: Secondary | ICD-10-CM | POA: Diagnosis not present

## 2016-11-09 DIAGNOSIS — D649 Anemia, unspecified: Secondary | ICD-10-CM | POA: Diagnosis not present

## 2016-11-09 DIAGNOSIS — E1122 Type 2 diabetes mellitus with diabetic chronic kidney disease: Secondary | ICD-10-CM | POA: Diagnosis not present

## 2016-11-09 DIAGNOSIS — M6281 Muscle weakness (generalized): Secondary | ICD-10-CM | POA: Diagnosis not present

## 2016-11-09 DIAGNOSIS — R2681 Unsteadiness on feet: Secondary | ICD-10-CM | POA: Diagnosis not present

## 2016-11-09 DIAGNOSIS — M1380 Other specified arthritis, unspecified site: Secondary | ICD-10-CM | POA: Diagnosis not present

## 2016-11-09 DIAGNOSIS — I5022 Chronic systolic (congestive) heart failure: Secondary | ICD-10-CM | POA: Diagnosis not present

## 2016-11-09 DIAGNOSIS — R262 Difficulty in walking, not elsewhere classified: Secondary | ICD-10-CM | POA: Diagnosis not present

## 2016-11-09 DIAGNOSIS — Z95810 Presence of automatic (implantable) cardiac defibrillator: Secondary | ICD-10-CM | POA: Diagnosis not present

## 2016-11-09 DIAGNOSIS — I2510 Atherosclerotic heart disease of native coronary artery without angina pectoris: Secondary | ICD-10-CM | POA: Diagnosis not present

## 2016-11-09 DIAGNOSIS — N183 Chronic kidney disease, stage 3 (moderate): Secondary | ICD-10-CM | POA: Diagnosis not present

## 2016-11-10 DIAGNOSIS — N183 Chronic kidney disease, stage 3 (moderate): Secondary | ICD-10-CM | POA: Diagnosis not present

## 2016-11-10 DIAGNOSIS — D649 Anemia, unspecified: Secondary | ICD-10-CM | POA: Diagnosis not present

## 2016-11-10 DIAGNOSIS — M6281 Muscle weakness (generalized): Secondary | ICD-10-CM | POA: Diagnosis not present

## 2016-11-10 DIAGNOSIS — I2510 Atherosclerotic heart disease of native coronary artery without angina pectoris: Secondary | ICD-10-CM | POA: Diagnosis not present

## 2016-11-10 DIAGNOSIS — R262 Difficulty in walking, not elsewhere classified: Secondary | ICD-10-CM | POA: Diagnosis not present

## 2016-11-10 DIAGNOSIS — R2689 Other abnormalities of gait and mobility: Secondary | ICD-10-CM | POA: Diagnosis not present

## 2016-11-10 DIAGNOSIS — R2681 Unsteadiness on feet: Secondary | ICD-10-CM | POA: Diagnosis not present

## 2016-11-10 DIAGNOSIS — Z95810 Presence of automatic (implantable) cardiac defibrillator: Secondary | ICD-10-CM | POA: Diagnosis not present

## 2016-11-10 DIAGNOSIS — M25561 Pain in right knee: Secondary | ICD-10-CM | POA: Diagnosis not present

## 2016-11-10 DIAGNOSIS — I5022 Chronic systolic (congestive) heart failure: Secondary | ICD-10-CM | POA: Diagnosis not present

## 2016-11-10 DIAGNOSIS — M1380 Other specified arthritis, unspecified site: Secondary | ICD-10-CM | POA: Diagnosis not present

## 2016-11-10 DIAGNOSIS — E1122 Type 2 diabetes mellitus with diabetic chronic kidney disease: Secondary | ICD-10-CM | POA: Diagnosis not present

## 2016-11-11 DIAGNOSIS — M6281 Muscle weakness (generalized): Secondary | ICD-10-CM | POA: Diagnosis not present

## 2016-11-11 DIAGNOSIS — I5022 Chronic systolic (congestive) heart failure: Secondary | ICD-10-CM | POA: Diagnosis not present

## 2016-11-11 DIAGNOSIS — Z95810 Presence of automatic (implantable) cardiac defibrillator: Secondary | ICD-10-CM | POA: Diagnosis not present

## 2016-11-11 DIAGNOSIS — N183 Chronic kidney disease, stage 3 (moderate): Secondary | ICD-10-CM | POA: Diagnosis not present

## 2016-11-11 DIAGNOSIS — R2681 Unsteadiness on feet: Secondary | ICD-10-CM | POA: Diagnosis not present

## 2016-11-11 DIAGNOSIS — R262 Difficulty in walking, not elsewhere classified: Secondary | ICD-10-CM | POA: Diagnosis not present

## 2016-11-11 DIAGNOSIS — I2510 Atherosclerotic heart disease of native coronary artery without angina pectoris: Secondary | ICD-10-CM | POA: Diagnosis not present

## 2016-11-11 DIAGNOSIS — E1122 Type 2 diabetes mellitus with diabetic chronic kidney disease: Secondary | ICD-10-CM | POA: Diagnosis not present

## 2016-11-11 DIAGNOSIS — M1380 Other specified arthritis, unspecified site: Secondary | ICD-10-CM | POA: Diagnosis not present

## 2016-11-11 DIAGNOSIS — D649 Anemia, unspecified: Secondary | ICD-10-CM | POA: Diagnosis not present

## 2016-11-11 DIAGNOSIS — M25561 Pain in right knee: Secondary | ICD-10-CM | POA: Diagnosis not present

## 2016-11-11 DIAGNOSIS — R2689 Other abnormalities of gait and mobility: Secondary | ICD-10-CM | POA: Diagnosis not present

## 2016-11-13 ENCOUNTER — Encounter (HOSPITAL_COMMUNITY): Payer: Self-pay

## 2016-11-13 ENCOUNTER — Emergency Department (HOSPITAL_COMMUNITY)
Admission: EM | Admit: 2016-11-13 | Discharge: 2016-11-17 | Disposition: A | Discharge status: Home / Self Care | Payer: Medicare Other | Attending: Emergency Medicine | Admitting: Emergency Medicine

## 2016-11-13 DIAGNOSIS — I2510 Atherosclerotic heart disease of native coronary artery without angina pectoris: Secondary | ICD-10-CM | POA: Diagnosis not present

## 2016-11-13 DIAGNOSIS — R9431 Abnormal electrocardiogram [ECG] [EKG]: Secondary | ICD-10-CM | POA: Diagnosis not present

## 2016-11-13 DIAGNOSIS — Z0000 Encounter for general adult medical examination without abnormal findings: Secondary | ICD-10-CM

## 2016-11-13 DIAGNOSIS — Z7901 Long term (current) use of anticoagulants: Secondary | ICD-10-CM | POA: Insufficient documentation

## 2016-11-13 DIAGNOSIS — Z7984 Long term (current) use of oral hypoglycemic drugs: Secondary | ICD-10-CM | POA: Diagnosis not present

## 2016-11-13 DIAGNOSIS — N183 Chronic kidney disease, stage 3 (moderate): Secondary | ICD-10-CM | POA: Insufficient documentation

## 2016-11-13 DIAGNOSIS — Z96641 Presence of right artificial hip joint: Secondary | ICD-10-CM | POA: Insufficient documentation

## 2016-11-13 DIAGNOSIS — Z0001 Encounter for general adult medical examination with abnormal findings: Secondary | ICD-10-CM | POA: Diagnosis not present

## 2016-11-13 DIAGNOSIS — F419 Anxiety disorder, unspecified: Secondary | ICD-10-CM | POA: Diagnosis not present

## 2016-11-13 DIAGNOSIS — G4700 Insomnia, unspecified: Secondary | ICD-10-CM | POA: Diagnosis not present

## 2016-11-13 DIAGNOSIS — F02818 Dementia in other diseases classified elsewhere, unspecified severity, with other behavioral disturbance: Secondary | ICD-10-CM

## 2016-11-13 DIAGNOSIS — E1122 Type 2 diabetes mellitus with diabetic chronic kidney disease: Secondary | ICD-10-CM | POA: Diagnosis not present

## 2016-11-13 DIAGNOSIS — Z79899 Other long term (current) drug therapy: Secondary | ICD-10-CM | POA: Insufficient documentation

## 2016-11-13 DIAGNOSIS — R451 Restlessness and agitation: Secondary | ICD-10-CM | POA: Diagnosis present

## 2016-11-13 DIAGNOSIS — I5022 Chronic systolic (congestive) heart failure: Secondary | ICD-10-CM | POA: Insufficient documentation

## 2016-11-13 DIAGNOSIS — R413 Other amnesia: Secondary | ICD-10-CM | POA: Diagnosis not present

## 2016-11-13 DIAGNOSIS — F4321 Adjustment disorder with depressed mood: Secondary | ICD-10-CM | POA: Diagnosis not present

## 2016-11-13 DIAGNOSIS — I130 Hypertensive heart and chronic kidney disease with heart failure and stage 1 through stage 4 chronic kidney disease, or unspecified chronic kidney disease: Secondary | ICD-10-CM | POA: Insufficient documentation

## 2016-11-13 DIAGNOSIS — I517 Cardiomegaly: Secondary | ICD-10-CM | POA: Diagnosis not present

## 2016-11-13 DIAGNOSIS — F03918 Unspecified dementia, unspecified severity, with other behavioral disturbance: Secondary | ICD-10-CM | POA: Diagnosis present

## 2016-11-13 DIAGNOSIS — F0391 Unspecified dementia with behavioral disturbance: Secondary | ICD-10-CM | POA: Diagnosis present

## 2016-11-13 DIAGNOSIS — F0281 Dementia in other diseases classified elsewhere with behavioral disturbance: Secondary | ICD-10-CM | POA: Diagnosis not present

## 2016-11-13 LAB — CBC WITH DIFFERENTIAL/PLATELET
BASOS ABS: 0 10*3/uL (ref 0.0–0.1)
Basophils Relative: 1 %
EOS PCT: 7 %
Eosinophils Absolute: 0.4 10*3/uL (ref 0.0–0.7)
HCT: 25.1 % — ABNORMAL LOW (ref 39.0–52.0)
Hemoglobin: 8.4 g/dL — ABNORMAL LOW (ref 13.0–17.0)
LYMPHS PCT: 27 %
Lymphs Abs: 1.6 10*3/uL (ref 0.7–4.0)
MCH: 31.2 pg (ref 26.0–34.0)
MCHC: 33.5 g/dL (ref 30.0–36.0)
MCV: 93.3 fL (ref 78.0–100.0)
Monocytes Absolute: 0.5 10*3/uL (ref 0.1–1.0)
Monocytes Relative: 8 %
NEUTROS ABS: 3.5 10*3/uL (ref 1.7–7.7)
Neutrophils Relative %: 57 %
PLATELETS: 193 10*3/uL (ref 150–400)
RBC: 2.69 MIL/uL — AB (ref 4.22–5.81)
RDW: 16.7 % — ABNORMAL HIGH (ref 11.5–15.5)
WBC: 6 10*3/uL (ref 4.0–10.5)

## 2016-11-13 LAB — COMPREHENSIVE METABOLIC PANEL
ALBUMIN: 3.8 g/dL (ref 3.5–5.0)
ALT: 9 U/L — AB (ref 17–63)
AST: 15 U/L (ref 15–41)
Alkaline Phosphatase: 42 U/L (ref 38–126)
Anion gap: 8 (ref 5–15)
BUN: 32 mg/dL — AB (ref 6–20)
CHLORIDE: 107 mmol/L (ref 101–111)
CO2: 27 mmol/L (ref 22–32)
Calcium: 9.4 mg/dL (ref 8.9–10.3)
Creatinine, Ser: 1.21 mg/dL (ref 0.61–1.24)
GFR calc Af Amer: >60 mL/min (ref >60)
GFR, EST NON AFRICAN AMERICAN: 57 mL/min — AB (ref >60)
GLUCOSE: 128 mg/dL — AB (ref 65–99)
Potassium: 4.6 mmol/L (ref 3.5–5.1)
Sodium: 142 mmol/L (ref 135–145)
Total Bilirubin: 0.4 mg/dL (ref 0.3–1.2)
Total Protein: 7 g/dL (ref 6.5–8.1)

## 2016-11-13 LAB — RAPID URINE DRUG SCREEN, HOSP PERFORMED
Amphetamines: NOT DETECTED
Barbiturates: NOT DETECTED
Benzodiazepines: NOT DETECTED
COCAINE: NOT DETECTED
OPIATES: NOT DETECTED
Tetrahydrocannabinol: NOT DETECTED

## 2016-11-13 LAB — URINALYSIS, ROUTINE W REFLEX MICROSCOPIC
BILIRUBIN URINE: NEGATIVE
Glucose, UA: NEGATIVE mg/dL
Hgb urine dipstick: NEGATIVE
KETONES UR: 5 mg/dL — AB
Leukocytes, UA: NEGATIVE
Nitrite: NEGATIVE
PROTEIN: 30 mg/dL — AB
SQUAMOUS EPITHELIAL / LPF: NONE SEEN
Specific Gravity, Urine: 1.017 (ref 1.005–1.030)
pH: 5 (ref 5.0–8.0)

## 2016-11-13 LAB — ETHANOL

## 2016-11-13 MED ORDER — RIVAROXABAN 15 MG PO TABS
15.0000 mg | ORAL_TABLET | Freq: Every day | ORAL | Status: DC
  Administered 2016-11-13 – 2016-11-16 (×4): 15 mg via ORAL
  Filled 2016-11-13 (×5): qty 1

## 2016-11-13 MED ORDER — CARVEDILOL 6.25 MG PO TABS
6.2500 mg | ORAL_TABLET | Freq: Two times a day (BID) | ORAL | Status: DC
  Administered 2016-11-13 – 2016-11-17 (×8): 6.25 mg via ORAL
  Filled 2016-11-13 (×9): qty 1

## 2016-11-13 MED ORDER — GABAPENTIN 100 MG PO CAPS
100.0000 mg | ORAL_CAPSULE | Freq: Two times a day (BID) | ORAL | Status: DC
  Administered 2016-11-13: 100 mg via ORAL
  Filled 2016-11-13: qty 1

## 2016-11-13 MED ORDER — IVABRADINE HCL 5 MG PO TABS
5.0000 mg | ORAL_TABLET | Freq: Two times a day (BID) | ORAL | Status: DC
  Administered 2016-11-14 – 2016-11-17 (×6): 5 mg via ORAL
  Filled 2016-11-13 (×8): qty 1

## 2016-11-13 MED ORDER — METFORMIN HCL 500 MG PO TABS
1000.0000 mg | ORAL_TABLET | Freq: Two times a day (BID) | ORAL | Status: DC
  Administered 2016-11-14 – 2016-11-17 (×7): 1000 mg via ORAL
  Filled 2016-11-13 (×7): qty 2

## 2016-11-13 MED ORDER — SODIUM CHLORIDE 0.9 % IV BOLUS (SEPSIS): 1000.0000 mL | Freq: Once | INTRAVENOUS | Status: DC

## 2016-11-13 MED ORDER — ISOSORBIDE MONONITRATE ER 30 MG PO TB24
30.0000 mg | ORAL_TABLET | Freq: Every day | ORAL | Status: DC
  Administered 2016-11-14 – 2016-11-17 (×4): 30 mg via ORAL
  Filled 2016-11-13 (×4): qty 1

## 2016-11-13 MED ORDER — HYDRALAZINE HCL 25 MG PO TABS
12.5000 mg | ORAL_TABLET | Freq: Three times a day (TID) | ORAL | Status: DC
  Administered 2016-11-13 – 2016-11-16 (×8): 12.5 mg via ORAL
  Administered 2016-11-17: 10:00:00 via ORAL
  Filled 2016-11-13 (×14): qty 0.5

## 2016-11-13 MED ORDER — DIVALPROEX SODIUM 125 MG PO DR TAB
125.0000 mg | DELAYED_RELEASE_TABLET | Freq: Two times a day (BID) | ORAL | Status: DC
  Administered 2016-11-13: 125 mg via ORAL
  Filled 2016-11-13 (×2): qty 1

## 2016-11-13 MED ORDER — SODIUM CHLORIDE 0.9 % IV SOLN
INTRAVENOUS | Status: DC
Start: 2016-11-13 — End: 2016-11-13

## 2016-11-13 MED ORDER — ROSUVASTATIN CALCIUM 10 MG PO TABS
10.0000 mg | ORAL_TABLET | Freq: Every day | ORAL | Status: DC
  Administered 2016-11-13 – 2016-11-16 (×4): 10 mg via ORAL
  Filled 2016-11-13 (×5): qty 1

## 2016-11-13 MED ORDER — IPRATROPIUM BROMIDE 0.06 % NA SOLN
2.0000 | Freq: Four times a day (QID) | NASAL | Status: DC
  Administered 2016-11-13 – 2016-11-16 (×8): 2 via NASAL
  Filled 2016-11-13: qty 15

## 2016-11-13 MED ORDER — QUETIAPINE FUMARATE 50 MG PO TABS
75.0000 mg | ORAL_TABLET | Freq: Every day | ORAL | Status: DC
  Administered 2016-11-13 – 2016-11-16 (×4): 75 mg via ORAL
  Filled 2016-11-13 (×4): qty 1

## 2016-11-13 MED ORDER — FLUTICASONE PROPIONATE 50 MCG/ACT NA SUSP
2.0000 | Freq: Every day | NASAL | Status: DC | PRN
  Filled 2016-11-13: qty 16
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
## 2016-11-13 NOTE — ED Notes (Signed)
Report given to [place], rn.

## 2016-11-13 NOTE — ED Notes (Signed)
[name] (son and POA) Tel: [phone] Harvie Heck wants MD to update on patient's status.

## 2016-11-13 NOTE — ED Notes (Signed)
Labs drawn
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
## 2016-11-13 NOTE — Progress Notes (Signed)
Pt's referral has been faxed to the following inpt facilities for possible admission:  [facility], [address] [place]  Princess Bruins, MSW, [employer] TTS Specialist [phone]

## 2016-11-13 NOTE — ED Notes (Signed)
Bed: IO27 Expected date:  Expected time:  Means of arrival:  Comments: 76 yo med clearance; aggressive

## 2016-11-14 ENCOUNTER — Emergency Department (HOSPITAL_COMMUNITY): Payer: Medicare Other

## 2016-11-14 DIAGNOSIS — F4321 Adjustment disorder with depressed mood: Secondary | ICD-10-CM | POA: Diagnosis not present

## 2016-11-14 DIAGNOSIS — F0281 Dementia in other diseases classified elsewhere with behavioral disturbance: Secondary | ICD-10-CM

## 2016-11-14 DIAGNOSIS — G4700 Insomnia, unspecified: Secondary | ICD-10-CM

## 2016-11-14 DIAGNOSIS — I517 Cardiomegaly: Secondary | ICD-10-CM | POA: Diagnosis not present

## 2016-11-14 DIAGNOSIS — R413 Other amnesia: Secondary | ICD-10-CM

## 2016-11-14 DIAGNOSIS — F419 Anxiety disorder, unspecified: Secondary | ICD-10-CM

## 2016-11-14 LAB — URINE CULTURE: CULTURE: NO GROWTH

## 2016-11-14 MED ORDER — ESCITALOPRAM OXALATE 10 MG PO TABS
10.0000 mg | ORAL_TABLET | Freq: Every day | ORAL | Status: DC
  Administered 2016-11-14 – 2016-11-17 (×4): 10 mg via ORAL
  Filled 2016-11-14 (×4): qty 1

## 2016-11-14 MED ORDER — GABAPENTIN 100 MG PO CAPS
200.0000 mg | ORAL_CAPSULE | Freq: Two times a day (BID) | ORAL | Status: DC
  Administered 2016-11-14 – 2016-11-17 (×7): 200 mg via ORAL
  Filled 2016-11-14 (×7): qty 2
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
## 2016-11-14 NOTE — ED Notes (Signed)
Patient denies pain and is resting comfortably.  
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
## 2016-11-15 DIAGNOSIS — F4321 Adjustment disorder with depressed mood: Secondary | ICD-10-CM | POA: Diagnosis not present

## 2016-11-15 LAB — CBG MONITORING, ED: Glucose-Capillary: 115 mg/dL — ABNORMAL HIGH (ref 65–99)

## 2016-11-15 NOTE — ED Notes (Signed)
Patient resting. Calm and cooperative.  Taking medications without difficulty.  Patient asking where he is and what he is doing here.

## 2016-11-15 NOTE — Consult Note (Signed)
[name] Progress Note  11/15/2016 11:30 AM [name]  MRN:  [id number]   Subjective/Objective:  Patient with history of Dementia who was brought by his family for evaluation due to aggressive, assaultive and violent behavior probably in the context of sundowning from Dementia. Patient is a poor historian and he remains delusional with the believe that he would hurt anyone who tries to go after his family.  Principal Problem: Dementia with behavioral disturbance Diagnosis:   Patient Active Problem List   Diagnosis Date Noted  . Dementia with behavioral disturbance [F03.91] 03/29/2016    Priority: High  . CKD (chronic kidney disease) stage 3, GFR 30-59 ml/min [N18.3] 03/28/2016  . Diabetes ([place]) [E11.9] 03/27/2016  . AKI (acute kidney injury) ([place]) [N17.9] 03/27/2016  . Adjustment disorder with depressed mood [F43.21] 12/25/2015  . Memory deficits [R41.3] 12/01/2015  . Encephalopathy, metabolic [Y64.15] 83/12/4074  . Acute left PCA stroke ([place]) [I63.532] 11/25/2015  . Wound dehiscence [T81.30XA] 03/03/2015  . 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 Cervical spondylosis with myelopathy [M47.12] 04/17/2014  . ICD (implantable cardioverter-defibrillator), biventricular, in situ [Z95.810] 11/19/2013  . Cardiac resynchronization therapy defibrillator (CRT-D) in place [Z95.810] 07/03/2013  . Chronic renal failure [N18.9] 01/24/2013  . CAD (coronary artery disease) [I25.10] 12/10/2012  . Chronic systolic heart failure ([place]) [I50.22] 11/20/2012  . Cardiomyopathy, ischemic [I25.5] 11/20/2012  . Atrial flutter ([place]) [I48.92] 11/08/2012  . Dilated cardiomyopathy ([place]) [I42.0] 11/01/2012  . Cervical stenosis of spine [M48.02] 10/28/2011  . Spondylosis of cervical joint [M47.812] 10/28/2011  . Cervical myelopathy ([place]) [G95.9] 10/28/2011  . Quadriparesis ([place]) [G82.50] 10/28/2011  . DVT of right proximal brachial through the distal axillary vein, acute  [I82.A11] 10/06/2011  . Constipation due to pain medication [K59.03] 10/06/2011  . Venous insufficiency [I87.2] 10/05/2011  . 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 Hyperlipidemia [E78.5] 10/05/2011  . Normocytic anemia [D64.9] 10/03/2011  . Falls frequently [R29.6] 10/03/2011  . Diabetes mellitus with chronic kidney disease ([place]) [E11.22] 10/03/2011  . HTN (hypertension) [I10] 10/03/2011  . Gait abnormality secondary to lumbar spinal stenosis [R26.9] 10/03/2011  . Arthritis [M19.90] 10/03/2011   Total Time spent with patient: 30 minutes  Past Psychiatric History: as above  Past Medical History:  Past Medical History:  Diagnosis Date  . Acute encephalopathy   . AICD (automatic cardioverter/defibrillator) present   . Anemia   . Arthritis    "all over" (05/16/2013)  . Atrial flutter ([place])   . CAD (coronary artery disease)   . Cardiomyopathy ([place])    a. 10/2012 Echo: EF 20-25%, diff HK, mod dil LA/RV/RA.  Marland Kitchen Cervical myelopathy ([place])   . Cervical spinal stenosis   . Chronic systolic congestive heart failure ([place])   . CKD (chronic kidney disease), stage III   . 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 CVA (cerebral vascular accident) ([place])   . Decreased vision of right eye   . Dementia   . Depression    "son died in service in 05/31/1990; still bothers me" (05/16/2013)  . DVT (deep venous thrombosis) ([place]) 10/2011   RUE; pt denies this hx on 05/16/2013  . Dysrhythmia   . Frequent falls   . GERD (gastroesophageal reflux disease)   . H/O hiatal hernia   . Hyperlipidemia   . Hypertension   . Hypoglycemia   .  Lumbar spinal stenosis   . Mononeuritis of unspecified site   . Myocardial infarction ([place])   . Pneumonia 1960   , also 2014  . Quadriparesis ([place]) 2013   pre op  . Shortness of breath dyspnea    with exertion  .  Type II diabetes mellitus ([place])    type 2  . Venous insufficiency     Past Surgical History:  Procedure Laterality Date  . ANTERIOR CERVICAL DECOMP/DISCECTOMY FUSION  10/31/2011   Procedure: ANTERIOR CERVICAL DECOMPRESSION/DISCECTOMY FUSION 2 LEVELS;  Surgeon: Ophelia Charter, MD;  Location: [facility];  Service: Neurosurgery;  Laterality: N/A;  Cervical Four-Five Cervical Five-Six Anterior Cervical Decompression with fusion interbody prothesis plating and bonegraft  . ANTERIOR CERVICAL DECOMP/DISCECTOMY FUSION N/A 04/17/2014   Procedure: CERVICAL THREE TO FOUR ANTERIOR CERVICAL DECOMPRESSION/DISCECTOMY FUSION 1 LEVEL/HARDWARE REMOVAL;  Surgeon: Newman Pies, MD;  Location: [facility];  Service: Neurosurgery;  Laterality: N/A;  C34 anterior cervical decompression with fusion interbody prosthesis plating and bonegraft with exploration of prev fusion and possible removal of old hardware  . BI-VENTRICULAR IMPLANTABLE CARDIOVERTER DEFIBRILLATOR N/A 05/16/2013   Procedure: BI-VENTRICULAR IMPLANTABLE CARDIOVERTER DEFIBRILLATOR  (CRT-D);  Surgeon: Evans Lance, MD;  [facility] LAB;  Service: Cardiovascular;  Laterality: N/A;  . BI-VENTRICULAR IMPLANTABLE CARDIOVERTER DEFIBRILLATOR  (CRT-D) Left 05/16/2013   STJ CRTD implanted by Dr Lovena Le for primary prevention/CHF  . CARDIAC CATHETERIZATION  10/2012  . CARPAL TUNNEL RELEASE Right ~ 2013  . CATARACT EXTRACTION W/ INTRAOCULAR LENS IMPLANT Bilateral   . JOINT REPLACEMENT  2013  . LEFT AND RIGHT HEART CATHETERIZATION WITH CORONARY ANGIOGRAM N/A 11/06/2012   Procedure: LEFT AND RIGHT HEART CATHETERIZATION WITH CORONARY ANGIOGRAM;  Surgeon: Larey Dresser, MD;  Location: [name];  Service: Cardiovascular;  Laterality: N/A;  . LUMBAR LAMINECTOMY/DECOMPRESSION MICRODISCECTOMY N/A 02/02/2015   Procedure: LUMBAR LAMINECTOMY/DECOMPRESSION MICRODISCECTOMY 2 LEVELS;  Surgeon: Newman Pies, MD;  Location: [facility];  Service: Neurosurgery;   Laterality: N/A;  L34 L45 laminectomy and foraminotomy  . LUMBAR WOUND DEBRIDEMENT N/A 03/04/2015   Procedure: Incision and Drainage of LUMBAR WOUND ;  Surgeon: Newman Pies, MD;  Location: [facility];  Service: Neurosurgery;  Laterality: N/A;  . 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 toe removal  Left    3rd and 4th toe removed  . TOTAL HIP ARTHROPLASTY Right ~ 2012   Family History:  Family History  Problem Relation Age of Onset  . Diabetes Brother   . Cancer Father        Lung cancer  . Heart attack Mother        pt thinks she had MI  . Cancer Mother    Family Psychiatric  History:  Social History:  History  Alcohol Use No    Comment: 05/16/2013 "used to drink a little bit a long time ago; nothing in over 30 yrs"     History  Drug Use No    Social History   Social History  . Marital status: Married    Spouse name: N/A  . Number of children: N/A  . Years of education: N/A   Social History Main Topics  . Smoking status: Never Smoker  . Smokeless tobacco: Never Used  . Alcohol use No     Comment: 05/16/2013 "used to drink a little bit a long time ago; nothing in over 30 yrs"  . Drug use: No  . Sexual activity: Not Currently  Other Topics Concern  . None   Social History Narrative  . None    Sleep: Fair  Appetite:  Fair  Current Medications: Current Facility-Administered Medications  Medication Dose Route Frequency Provider Last Rate Last Dose  . carvedilol (COREG) tablet 6.25 mg  6.25 mg Oral BID WC Lacretia Leigh, MD   6.25 mg at 11/15/16 0908  . escitalopram (LEXAPRO) tablet 10 mg  10 mg Oral Daily Izetta Sakamoto, MD   10 mg at 11/15/16 0907  . fluticasone (FLONASE) 50 MCG/ACT nasal spray 2 spray  2 spray Each Nare Daily PRN Lacretia Leigh, MD      . gabapentin (NEURONTIN) capsule 200 mg  200 mg Oral BID Corena Pilgrim, MD   200 mg at 11/15/16 0907  . hydrALAZINE (APRESOLINE) tablet 12.5 mg  12.5 mg Oral TID Lacretia Leigh,  MD   Stopped at 11/15/16 0034  . ipratropium (ATROVENT) 0.06 % nasal spray 2 spray  2 spray Each Nare QID Lacretia Leigh, MD   2 spray at 11/15/16 0934  . isosorbide mononitrate (IMDUR) 24 hr tablet 30 mg  30 mg Oral Daily Lacretia Leigh, MD   30 mg at 11/15/16 0934  . ivabradine (CORLANOR) tablet 5 mg  5 mg Oral BID WC Lacretia Leigh, MD   Stopped at 11/15/16 [phone]  . metFORMIN (GLUCOPHAGE) tablet 1,000 mg  1,000 mg Oral BID WC Lacretia Leigh, MD   1,000 mg at 11/15/16 0908  . QUEtiapine (SEROQUEL) tablet 75 mg  75 mg Oral QHS Lacretia Leigh, MD   75 mg at 11/14/16 2221  . Rivaroxaban (XARELTO) tablet 15 mg  15 mg Oral Q supper Lacretia Leigh, MD   15 mg at 11/14/16 1755  . rosuvastatin (CRESTOR) tablet 10 mg  10 mg Oral q1800 Lacretia Leigh, MD   10 mg at 11/14/16 1832   Current Outpatient Prescriptions  Medication Sig Dispense Refill  . Calcium Carbonate Antacid (TUMS PO) Take 1 tablet by mouth 2 (two) times daily as needed (indigestion).    . carvedilol (COREG) 6.25 MG tablet Take 1 tablet (6.25 mg total) by mouth 2 (two) times daily. 180 tablet 3  . divalproex (DEPAKOTE) 125 MG DR tablet Take 125 mg by mouth 2 (two) times daily.    . fluticasone (FLONASE) 50 MCG/ACT nasal spray Place 2 sprays into both nostrils daily as needed for allergies. 16 g 0  . gabapentin (NEURONTIN) 100 MG capsule Take 100 mg by mouth 2 (two) times daily.    . hydrALAZINE (APRESOLINE) 25 MG tablet TAKE ONE-HALF TABLET BY MOUTH THREE TIMES DAILY (Patient taking differently: Take 12.5 mg by mouth 3 (three) times daily. ) 45 tablet 0  . ipratropium (ATROVENT) 0.06 % nasal spray Place 2 sprays into both nostrils 4 (four) times daily.    . isosorbide mononitrate (IMDUR) 30 MG 24 hr tablet Take 1 tablet (30 mg total) by mouth daily. 30 tablet 0  . ivabradine (CORLANOR) 5 MG TABS tablet Take 1 tablet (5 mg total) by mouth 2 (two) times daily with a meal. 60 tablet 0  . magnesium hydroxide (MILK OF MAGNESIA) 400 MG/5ML  suspension Take 30 mLs by mouth daily as needed. (Patient taking differently: Take 30 mLs by mouth daily as needed for mild constipation. ) 355 mL 0  . menthol-zinc oxide (GOLD BOND) powder Apply 1 application topically 2 (two) times daily as needed (jock itch).    . metFORMIN (GLUCOPHAGE) 1000 MG tablet Take 1,000 mg by mouth 2 (two) times daily  with a meal.    . QUEtiapine (SEROQUEL) 25 MG tablet Take 75 mg by mouth at bedtime.    . Rivaroxaban (XARELTO) 15 MG TABS tablet Take 1 tablet (15 mg total) by mouth daily with supper. 30 tablet 0  . rosuvastatin (CRESTOR) 10 MG tablet Take 1 tablet (10 mg total) by mouth daily at 6 PM. 30 tablet 0  . silver sulfADIAZINE (SILVADENE) 1 % cream Apply 1 application topically daily as needed (wound care).    . sodium chloride (DEEP SEA NASAL SPRAY) 0.65 % nasal spray Place 2 sprays into the nose every 2 (two) hours as needed for congestion. 30 mL 0  . traMADol (ULTRAM) 50 MG tablet Take 50 mg by mouth every 8 (eight) hours as needed for moderate pain.    . traZODone (DESYREL) 50 MG tablet Take 1 tablet (50 mg total) by mouth at bedtime as needed for sleep. (Patient not taking: Reported on 11/13/2016) 30 tablet 0    Lab Results:  Results for orders placed or performed during the hospital encounter of 11/13/16 (from the past 48 hour(s))  CBC with Differential/Platelet     Status: Abnormal   Collection Time: 11/13/16  4:23 PM  Result Value Ref Range   WBC 6.0 4.0 - 10.5 K/uL   RBC 2.69 (L) 4.22 - 5.81 MIL/uL   Hemoglobin 8.4 (L) 13.0 - 17.0 g/dL   HCT 25.1 (L) 39.0 - 52.0 %   MCV 93.3 78.0 - 100.0 fL   MCH 31.2 26.0 - 34.0 pg   MCHC 33.5 30.0 - 36.0 g/dL   RDW 16.7 (H) 11.5 - 15.5 %   Platelets 193 150 - 400 K/uL   Neutrophils Relative % 57 %   Neutro Abs 3.5 1.7 - 7.7 K/uL   Lymphocytes Relative 27 %   Lymphs Abs 1.6 0.7 - 4.0 K/uL   Monocytes Relative 8 %   Monocytes Absolute 0.5 0.1 - 1.0 K/uL   Eosinophils Relative 7 %   Eosinophils Absolute  0.4 0.0 - 0.7 K/uL   Basophils Relative 1 %   Basophils Absolute 0.0 0.0 - 0.1 K/uL  Comprehensive metabolic panel     Status: Abnormal   Collection Time: 11/13/16  4:23 PM  Result Value Ref Range   Sodium 142 135 - 145 mmol/L   Potassium 4.6 3.5 - 5.1 mmol/L   Chloride 107 101 - 111 mmol/L   CO2 27 22 - 32 mmol/L   Glucose, Bld 128 (H) 65 - 99 mg/dL   BUN 32 (H) 6 - 20 mg/dL   Creatinine, Ser 1.21 0.61 - 1.24 mg/dL   Calcium 9.4 8.9 - 10.3 mg/dL   Total Protein 7.0 6.5 - 8.1 g/dL   Albumin 3.8 3.5 - 5.0 g/dL   AST 15 15 - 41 U/L   ALT 9 (L) 17 - 63 U/L   Alkaline Phosphatase 42 38 - 126 U/L   Total Bilirubin 0.4 0.3 - 1.2 mg/dL   GFR calc non Af Amer 57 (L) >60 mL/min   GFR calc Af Amer >60 >60 mL/min    Comment: (NOTE) The eGFR has been calculated using the CKD EPI equation. This calculation has not been validated in all clinical situations. eGFR's persistently <60 mL/min signify possible Chronic Kidney Disease.    Anion gap 8 5 - 15  Urinalysis, Routine w reflex microscopic     Status: Abnormal   Collection Time: 11/13/16  4:23 PM  Result Value Ref Range   Color, Urine  YELLOW YELLOW   APPearance CLEAR CLEAR   Specific Gravity, Urine 1.017 1.005 - 1.030   pH 5.0 5.0 - 8.0   Glucose, UA NEGATIVE NEGATIVE mg/dL   Hgb urine dipstick NEGATIVE NEGATIVE   Bilirubin Urine NEGATIVE NEGATIVE   Ketones, ur 5 (A) NEGATIVE mg/dL   Protein, ur 30 (A) NEGATIVE mg/dL   Nitrite NEGATIVE NEGATIVE   Leukocytes, UA NEGATIVE NEGATIVE   RBC / HPF 0-5 0 - 5 RBC/hpf   WBC, UA 0-5 0 - 5 WBC/hpf   Bacteria, UA RARE (A) NONE SEEN   Squamous Epithelial / LPF NONE SEEN NONE SEEN   Mucous PRESENT    Hyaline Casts, UA PRESENT   Urine Culture     Status: None   Collection Time: 11/13/16  4:23 PM  Result Value Ref Range   Specimen Description URINE, RANDOM    Special Requests NONE    Culture      NO GROWTH Performed at [facility], [address] [place], [place] [address]     Report Status 11/14/2016 FINAL   Ethanol     Status: None   Collection Time: 11/13/16  4:24 PM  Result Value Ref Range   Alcohol, Ethyl (B) <5 <5 mg/dL    Comment:        LOWEST DETECTABLE LIMIT FOR SERUM ALCOHOL IS 5 mg/dL FOR MEDICAL PURPOSES ONLY   Rapid urine drug screen (hospital performed)     Status: None   Collection Time: 11/13/16  4:24 PM  Result Value Ref Range   Opiates NONE DETECTED NONE DETECTED   Cocaine NONE DETECTED NONE DETECTED   Benzodiazepines NONE DETECTED NONE DETECTED   Amphetamines NONE DETECTED NONE DETECTED   Tetrahydrocannabinol NONE DETECTED NONE DETECTED   Barbiturates NONE DETECTED NONE DETECTED    Comment:        DRUG SCREEN FOR MEDICAL PURPOSES ONLY.  IF CONFIRMATION IS NEEDED FOR ANY PURPOSE, NOTIFY LAB WITHIN 5 DAYS.        LOWEST DETECTABLE LIMITS FOR URINE DRUG SCREEN Drug Class       Cutoff (ng/mL) Amphetamine      1000 Barbiturate      200 Benzodiazepine   935 Tricyclics       701 Opiates          300 Cocaine          300 THC              50     Blood Alcohol level:  Lab Results  Component Value Date   ETH <5 11/13/2016   ETH <5 12/24/2015    Physical Findings: AIMS:  , ,  ,  ,    CIWA:    COWS:     Musculoskeletal: Strength & Muscle Tone: within normal limits Gait & Station: unsteady Patient leans: Front  Psychiatric Specialty Exam: Physical Exam  Psychiatric: Thought content normal. His mood appears anxious. His speech is delayed and tangential. He is withdrawn. Cognition and memory are impaired. He expresses impulsivity.    Review of Systems  Constitutional: Negative.   HENT: Negative.   Eyes: Negative.   Respiratory: Negative.   Cardiovascular: Negative.   Gastrointestinal: Negative.   Genitourinary: Negative.   Musculoskeletal: Negative.   Skin: Negative.   Neurological: Negative.   Endo/Heme/Allergies: Negative.   Psychiatric/Behavioral: Positive for memory loss. The patient is nervous/anxious and has  insomnia.     Blood pressure (!) 147/61, pulse (!) 59, temperature 97.6 F (36.4  C), temperature source Oral, resp. rate 12, height _0  (1.778 m), weight 74.8 kg (165 lb), SpO2 98 %.Body mass index is 23.68 kg/m.  General Appearance: Casual  Eye Contact:  Minimal  Speech:  Slow  Volume:  Decreased  Mood:  Dysphoric  Affect:  Constricted  Thought Process:  Disorganized  Orientation:  Other:  only to person  Thought Content:  Illogical  Suicidal Thoughts:  No  Homicidal Thoughts:  No  Memory:  Immediate;   Poor Recent;   Poor Remote;   Poor  Judgement:  Impaired  Insight:  Lacking  Psychomotor Activity:  Psychomotor Retardation  Concentration:  Concentration: Fair and Attention Span: Fair  Recall:  Poor  Fund of Knowledge:  unable to assess  Language:  Good  Akathisia:  No  Handed:  Right  AIMS (if indicated):     Assets:  Armed forces logistics/support/administrative officer Social Support Others:  family support  ADL's:  Impaired  Cognition:  Impaired,  Moderate  Sleep:         Treatment Plan Summary: Daily contact with patient to assess and evaluate symptoms and progress in treatment and Medication management . -Continue Gabapentin  200 mg bid due to aggression -Continue Seroquel 75 mg qhs for insomnia -Continue  Lexapro 10 mg daily for anxiety/aggression  Disposition: Recommend psychiatric Inpatient admission when medically cleared  Corena Pilgrim, MD 11/15/2016, 11:30 AM

## 2016-11-15 NOTE — BH Assessment (Addendum)
[facility] Assessment Progress Note  Per Thedore Mins, MD, this pt continues to require psychiatric hospitalization at this time.  The following facilities have been contacted to seek placement for this pt, with results as noted:  Beds available, information sent, decision pending:  [facility]   Declined:  [facility] (due to medical acuity) [facility] (due to third party payer problems) [facility] (due to pt acuity) [facility] (due to pt acuity) [facility] (dementia is exclusionary)   At capacity:  [facility]   Additionally, pt remains on the wait list at [facility].   Doylene Canning, MA Triage Specialist [phone]

## 2016-11-15 NOTE — ED Provider Notes (Signed)
Vitals:   11/14/16 2224 11/15/16 0623  BP: (!) 120/51 (!) 147/61  Pulse: 60 (!) 59  Resp: 15 12  Temp: 98.1 F (36.7 C) 97.6 F (36.4 C)  SpO2: 96% 98%   Stable.  Awaiting psych disposition   Linwood Dibbles, MD 11/15/16 [phone]

## 2016-11-16 DIAGNOSIS — F4321 Adjustment disorder with depressed mood: Secondary | ICD-10-CM | POA: Diagnosis not present

## 2016-11-16 DIAGNOSIS — G4700 Insomnia, unspecified: Secondary | ICD-10-CM | POA: Diagnosis not present

## 2016-11-16 DIAGNOSIS — F0281 Dementia in other diseases classified elsewhere with behavioral disturbance: Secondary | ICD-10-CM | POA: Diagnosis not present

## 2016-11-16 DIAGNOSIS — F419 Anxiety disorder, unspecified: Secondary | ICD-10-CM | POA: Diagnosis not present

## 2016-11-16 MED ORDER — GABAPENTIN 100 MG PO CAPS: 200.0000 mg | ORAL_CAPSULE | Freq: Two times a day (BID) | ORAL | 0 refills | Status: DC

## 2016-11-16 MED ORDER — ESCITALOPRAM OXALATE 10 MG PO TABS: 10.0000 mg | ORAL_TABLET | Freq: Every day | ORAL | 0 refills | Status: DC

## 2016-11-16 MED ORDER — QUETIAPINE FUMARATE 25 MG PO TABS: 75.0000 mg | ORAL_TABLET | Freq: Every day | ORAL | 0 refills | Status: DC

## 2016-11-16 NOTE — ED Notes (Signed)
Per sitter, pt did not want to eat at the moment.  Went back to sleep

## 2016-11-16 NOTE — [facility] Suicide Risk Assessment (Signed)
Suicide Risk Assessment  Discharge Assessment   [facility] Discharge Suicide Risk Assessment   Principal Problem: Dementia with behavioral disturbance Discharge Diagnoses:  Patient Active Problem List   Diagnosis Date Noted  . Dementia with behavioral disturbance [F03.91] 03/29/2016  . CKD (chronic kidney disease) stage 3, GFR 30-59 ml/min [N18.3] 03/28/2016  . Diabetes (HCC) [E11.9] 03/27/2016  . AKI (acute kidney injury) (HCC) [N17.9] 03/27/2016  . Adjustment disorder with depressed mood [F43.21] 12/25/2015  . Memory deficits [R41.3] 12/01/2015  . Encephalopathy, metabolic [G93.41] 11/28/2015  . Acute left PCA stroke (HCC) [I63.532] 11/25/2015  . Wound dehiscence [T81.30XA] 03/03/2015  . 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 Cervical spondylosis with myelopathy [M47.12] 04/17/2014  . ICD (implantable cardioverter-defibrillator), biventricular, in situ [Z95.810] 11/19/2013  . Cardiac resynchronization therapy defibrillator (CRT-D) in place [Z95.810] 07/03/2013  . Chronic renal failure [N18.9] 01/24/2013  . CAD (coronary artery disease) [I25.10] 12/10/2012  . Chronic systolic heart failure (HCC) [I50.22] 11/20/2012  . Cardiomyopathy, ischemic [I25.5] 11/20/2012  . Atrial flutter (HCC) [I48.92] 11/08/2012  . 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 Cervical stenosis of spine [M48.02] 10/28/2011  . Spondylosis of cervical joint [M47.812] 10/28/2011  . Cervical myelopathy (HCC) [G95.9] 10/28/2011  . Quadriparesis (HCC) [G82.50] 10/28/2011  . DVT of right proximal brachial through the distal axillary vein, acute [I82.A11] 10/06/2011  . Constipation due to pain medication [K59.03] 10/06/2011  . Venous insufficiency [I87.2] 10/05/2011  . 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 Hyperlipidemia [E78.5] 10/05/2011  . Normocytic anemia [D64.9] 10/03/2011  . Falls  frequently [R29.6] 10/03/2011  . 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 HTN (hypertension) [I10] 10/03/2011  . Gait abnormality secondary to lumbar spinal stenosis [R26.9] 10/03/2011  . Arthritis [M19.90] 10/03/2011    Total Time spent with patient: 30 minutes  Musculoskeletal: Strength & Muscle Tone: within normal limits Gait & Station: normal Patient leans: N/A Confused and agitated Psychiatric Specialty Exam: Physical Exam  Psychiatric: Thought content normal. His mood appears anxious. His speech is delayed and tangential. He is withdrawn. Cognition and memory are impaired. He expresses impulsivity.   Review of Systems  Constitutional: Negative.   HENT: Negative.   Eyes: Negative.   Respiratory: Negative.   Cardiovascular: Negative.   Gastrointestinal: Negative.   Genitourinary: Negative.   Musculoskeletal: Negative.   Skin: Negative.   Neurological: Negative.   Endo/Heme/Allergies: Negative.   Psychiatric/Behavioral: Positive for memory loss. The patient is nervous/anxious and has insomnia.    Blood pressure (!) 117/47, pulse 60, temperature 97.8 F (36.6 C), temperature source Oral, resp. rate 17, height 5\' 10"  (1.778 m), weight 74.8 kg (165 lb), SpO2 95 %.Body mass index is 23.68 kg/m. General Appearance: Casual Eye Contact:  Minimal Speech:  Slow Volume:  Decreased Mood:  Dysphoric Affect:  Constricted Thought Process:  Disorganized Orientation:  Other:  only to person Thought Content:  Illogical Suicidal Thoughts:  No Homicidal Thoughts:  No Memory:  Immediate;   Poor Recent;   Poor Remote;   Poor Judgement:  Impaired Insight:  Lacking Psychomotor Activity:  Psychomotor Retardation Concentration:  Concentration: Fair and Attention Span: Fair Recall:  Poor Fund of Knowledge:  unable to assess Language:  Good Akathisia:  No Handed:  Right AIMS (if indicated):    Assets:  Communication Skills Social Support Others:   family support ADL's:  Impaired Cognition:  Impaired,  Moderate  Mental Status Per Nursing Assessment::  On Admission:   Confused  Demographic Factors:  Male, Age 19 or older, Caucasian and Nursing home resident  Loss Factors: Decline in physical health  Historical Factors: dementia  Risk Reduction Factors:   24/7 care in nursing home  Continued Clinical Symptoms:  Depression:   Anhedonia Previous Psychiatric Diagnoses and Treatments  Cognitive Features That Contribute To Risk:  Loss of executive function    Suicide Risk:  Minimal: No identifiable suicidal ideation.  Patients presenting with no risk factors but with morbid ruminations; may be classified as minimal risk based on the severity of the depressive symptoms    Plan Of Care/Follow-up recommendations:  Activity:  as tolerated Diet:  Heart Healthy  Laveda Abbe, NP 11/16/2016, 12:17 PM

## 2016-11-16 NOTE — ED Notes (Signed)
Sitter at bedside is only for safety, pt is not combative or aggressive toward staff.  Pt is calm and cooperative.  No aggressive behaviors noted.
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
## 2016-11-16 NOTE — Progress Notes (Signed)
CSW updated daytime RN who will leave handoff for night time RN forwarding the CSW's request that RN's continue to document that pt no longer has a sitter and that if there is a sitter to assist the pt with standing up, etc, to please document this is what the sitter is for.  CSW Dept is also requesting that pt's behavior be documented so any behavior can be documented and sent to [facility] updating them on pt's current behavior(s).  Per disposition, pt's IVC paperwork is being rescinded now so a sitter will no longer be necessary due to pt being IVC'd.    ED CSW at night will continue to follow for D/C needs.  Dorothe Pea. Bryton Waight, Francesco Sor, CSI Clinical Social Worker Ph: [phone]     '
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
## 2016-11-16 NOTE — ED Notes (Signed)
Pt is sitting on the side of the bed eating dinner

## 2016-11-16 NOTE — ED Notes (Signed)
Pt no longer have a sitter.  No aggressive behaviors noted, and continues to stay calm and cooperative.

## 2016-11-16 NOTE — BH Assessment (Addendum)
[facility] Assessment Progress Note  Per Thedore Mins, MD, this pt does not require psychiatric hospitalization at this time.  Pt presents under IVC initiated by pt's son; Dr Jannifer Franklin will be rescinding this.  Pt is to be discharged from [facility] to return to the [facility] residential facility.  This Clinical research associate has spoken to Tatum and Cala Bradford in the Social Work Department to ask them to facilitate return.  I have also spoken to pt's son/HCPOA, [name]  [phone]), to inform him of disposition; his contact information has been provided to the Child psychotherapist.  Pt's nurse has been notified.  Doylene Canning, MA Triage Specialist [phone]   Addendum:  Dr Jannifer Franklin has rescinded pt's IVC.  Doylene Canning, MA Triage Specialist [phone]

## 2016-11-16 NOTE — ED Provider Notes (Signed)
Vitals:   11/15/16 2127 11/16/16 0636  BP: (!) 120/52 (!) 117/47  Pulse: (!) 59 60  Resp: 16 17  Temp: 99.1 F (37.3 C) 97.8 F (36.6 C)  SpO2: 97% 95%   Stable. Awaiting psychiatric disposition.   Nira Conn, MD 11/16/16 [phone]

## 2016-11-16 NOTE — ED Notes (Signed)
Pt is resting comfortably with eyes closed.  Rise and fall of chest noted.  Sitter at bedside.

## 2016-11-17 DIAGNOSIS — R262 Difficulty in walking, not elsewhere classified: Secondary | ICD-10-CM | POA: Diagnosis not present

## 2016-11-17 DIAGNOSIS — N183 Chronic kidney disease, stage 3 (moderate): Secondary | ICD-10-CM | POA: Diagnosis not present

## 2016-11-17 DIAGNOSIS — Z95810 Presence of automatic (implantable) cardiac defibrillator: Secondary | ICD-10-CM | POA: Diagnosis not present

## 2016-11-17 DIAGNOSIS — F4321 Adjustment disorder with depressed mood: Secondary | ICD-10-CM | POA: Diagnosis not present

## 2016-11-17 DIAGNOSIS — R2681 Unsteadiness on feet: Secondary | ICD-10-CM | POA: Diagnosis not present

## 2016-11-17 DIAGNOSIS — D649 Anemia, unspecified: Secondary | ICD-10-CM | POA: Diagnosis not present

## 2016-11-17 DIAGNOSIS — E1122 Type 2 diabetes mellitus with diabetic chronic kidney disease: Secondary | ICD-10-CM | POA: Diagnosis not present

## 2016-11-17 DIAGNOSIS — R2689 Other abnormalities of gait and mobility: Secondary | ICD-10-CM | POA: Diagnosis not present

## 2016-11-17 DIAGNOSIS — M25561 Pain in right knee: Secondary | ICD-10-CM | POA: Diagnosis not present

## 2016-11-17 DIAGNOSIS — I5022 Chronic systolic (congestive) heart failure: Secondary | ICD-10-CM | POA: Diagnosis not present

## 2016-11-17 DIAGNOSIS — M6281 Muscle weakness (generalized): Secondary | ICD-10-CM | POA: Diagnosis not present

## 2016-11-17 DIAGNOSIS — I2510 Atherosclerotic heart disease of native coronary artery without angina pectoris: Secondary | ICD-10-CM | POA: Diagnosis not present

## 2016-11-17 DIAGNOSIS — M1380 Other specified arthritis, unspecified site: Secondary | ICD-10-CM | POA: Diagnosis not present

## 2016-11-17 LAB — CBG MONITORING, ED: GLUCOSE-CAPILLARY: 107 mg/dL — AB (ref 65–99)

## 2016-11-17 NOTE — ED Notes (Signed)
Report given to Tye at [facility].

## 2016-11-17 NOTE — ED Provider Notes (Signed)
Pt will be sitter free for 24 hours at 1500 and can be transferred back to facility at that time. No significant events overnight. Appears to be sleeping comfortably.  Vitals:   11/16/16 2301 11/17/16 0555  BP: (!) 128/52 (!) 114/51  Pulse: 60 60  Resp: 16 14  Temp: 98.1 F (36.7 C) 97.8 F (36.6 C)  SpO2: 97% 97%      Raeford Razor, MD 11/17/16 [phone]

## 2016-11-17 NOTE — ED Notes (Signed)
Patient waiting to return to [facility] later today.

## 2016-11-17 NOTE — Progress Notes (Addendum)
CSW spoke with Wille Celeste from [facility] regarding patient returning today. Patients family to sign return paperwork at lunch time- once return paperwork is signed patient can return. CSW will fax needed information to return and wait for call back stating paperwork has been completed.   12:47PM: Patient able to return to [facility] at this tim CSW updated RN. Patient will return VIA ptar. Number for report is [phone] and room number is 312.   Stacy Gardner, [facility] Emergency Room Clinical Social Worker [phone]

## 2016-11-17 NOTE — ED Notes (Signed)
[facility] called for transport.  

## 2016-11-18 DIAGNOSIS — E119 Type 2 diabetes mellitus without complications: Secondary | ICD-10-CM | POA: Diagnosis not present

## 2016-11-18 DIAGNOSIS — N183 Chronic kidney disease, stage 3 (moderate): Secondary | ICD-10-CM | POA: Diagnosis not present

## 2016-11-18 DIAGNOSIS — Z79899 Other long term (current) drug therapy: Secondary | ICD-10-CM | POA: Diagnosis not present

## 2016-11-18 DIAGNOSIS — D649 Anemia, unspecified: Secondary | ICD-10-CM | POA: Diagnosis not present

## 2016-11-18 DIAGNOSIS — I5022 Chronic systolic (congestive) heart failure: Secondary | ICD-10-CM | POA: Diagnosis not present

## 2016-11-18 DIAGNOSIS — R2681 Unsteadiness on feet: Secondary | ICD-10-CM | POA: Diagnosis not present

## 2016-11-18 DIAGNOSIS — R2689 Other abnormalities of gait and mobility: Secondary | ICD-10-CM | POA: Diagnosis not present

## 2016-11-18 DIAGNOSIS — M6281 Muscle weakness (generalized): Secondary | ICD-10-CM | POA: Diagnosis not present

## 2016-11-18 DIAGNOSIS — I2510 Atherosclerotic heart disease of native coronary artery without angina pectoris: Secondary | ICD-10-CM | POA: Diagnosis not present

## 2016-11-18 DIAGNOSIS — M1380 Other specified arthritis, unspecified site: Secondary | ICD-10-CM | POA: Diagnosis not present

## 2016-11-18 DIAGNOSIS — Z95810 Presence of automatic (implantable) cardiac defibrillator: Secondary | ICD-10-CM | POA: Diagnosis not present

## 2016-11-18 DIAGNOSIS — E039 Hypothyroidism, unspecified: Secondary | ICD-10-CM | POA: Diagnosis not present

## 2016-11-18 DIAGNOSIS — E1122 Type 2 diabetes mellitus with diabetic chronic kidney disease: Secondary | ICD-10-CM | POA: Diagnosis not present

## 2016-11-18 DIAGNOSIS — R262 Difficulty in walking, not elsewhere classified: Secondary | ICD-10-CM | POA: Diagnosis not present

## 2016-11-18 DIAGNOSIS — M25561 Pain in right knee: Secondary | ICD-10-CM | POA: Diagnosis not present

## 2016-11-18 DIAGNOSIS — I509 Heart failure, unspecified: Secondary | ICD-10-CM | POA: Diagnosis not present

## 2016-11-18 DIAGNOSIS — E785 Hyperlipidemia, unspecified: Secondary | ICD-10-CM | POA: Diagnosis not present

## 2016-11-18 DIAGNOSIS — I69359 Hemiplegia and hemiparesis following cerebral infarction affecting unspecified side: Secondary | ICD-10-CM | POA: Diagnosis not present

## 2016-11-18 DIAGNOSIS — E559 Vitamin D deficiency, unspecified: Secondary | ICD-10-CM | POA: Diagnosis not present

## 2016-11-20 DIAGNOSIS — D649 Anemia, unspecified: Secondary | ICD-10-CM | POA: Diagnosis not present

## 2016-11-20 DIAGNOSIS — E1122 Type 2 diabetes mellitus with diabetic chronic kidney disease: Secondary | ICD-10-CM | POA: Diagnosis not present

## 2016-11-20 DIAGNOSIS — R262 Difficulty in walking, not elsewhere classified: Secondary | ICD-10-CM | POA: Diagnosis not present

## 2016-11-20 DIAGNOSIS — Z95810 Presence of automatic (implantable) cardiac defibrillator: Secondary | ICD-10-CM | POA: Diagnosis not present

## 2016-11-20 DIAGNOSIS — I2510 Atherosclerotic heart disease of native coronary artery without angina pectoris: Secondary | ICD-10-CM | POA: Diagnosis not present

## 2016-11-20 DIAGNOSIS — R2689 Other abnormalities of gait and mobility: Secondary | ICD-10-CM | POA: Diagnosis not present

## 2016-11-20 DIAGNOSIS — N183 Chronic kidney disease, stage 3 (moderate): Secondary | ICD-10-CM | POA: Diagnosis not present

## 2016-11-20 DIAGNOSIS — M1380 Other specified arthritis, unspecified site: Secondary | ICD-10-CM | POA: Diagnosis not present

## 2016-11-20 DIAGNOSIS — M6281 Muscle weakness (generalized): Secondary | ICD-10-CM | POA: Diagnosis not present

## 2016-11-20 DIAGNOSIS — R2681 Unsteadiness on feet: Secondary | ICD-10-CM | POA: Diagnosis not present

## 2016-11-20 DIAGNOSIS — M25561 Pain in right knee: Secondary | ICD-10-CM | POA: Diagnosis not present

## 2016-11-20 DIAGNOSIS — I5022 Chronic systolic (congestive) heart failure: Secondary | ICD-10-CM | POA: Diagnosis not present

## 2016-11-21 DIAGNOSIS — E1122 Type 2 diabetes mellitus with diabetic chronic kidney disease: Secondary | ICD-10-CM | POA: Diagnosis not present

## 2016-11-21 DIAGNOSIS — Z95810 Presence of automatic (implantable) cardiac defibrillator: Secondary | ICD-10-CM | POA: Diagnosis not present

## 2016-11-21 DIAGNOSIS — M1380 Other specified arthritis, unspecified site: Secondary | ICD-10-CM | POA: Diagnosis not present

## 2016-11-21 DIAGNOSIS — R2689 Other abnormalities of gait and mobility: Secondary | ICD-10-CM | POA: Diagnosis not present

## 2016-11-21 DIAGNOSIS — I2510 Atherosclerotic heart disease of native coronary artery without angina pectoris: Secondary | ICD-10-CM | POA: Diagnosis not present

## 2016-11-21 DIAGNOSIS — M25561 Pain in right knee: Secondary | ICD-10-CM | POA: Diagnosis not present

## 2016-11-21 DIAGNOSIS — D649 Anemia, unspecified: Secondary | ICD-10-CM | POA: Diagnosis not present

## 2016-11-21 DIAGNOSIS — N183 Chronic kidney disease, stage 3 (moderate): Secondary | ICD-10-CM | POA: Diagnosis not present

## 2016-11-21 DIAGNOSIS — R2681 Unsteadiness on feet: Secondary | ICD-10-CM | POA: Diagnosis not present

## 2016-11-21 DIAGNOSIS — M6281 Muscle weakness (generalized): Secondary | ICD-10-CM | POA: Diagnosis not present

## 2016-11-21 DIAGNOSIS — R262 Difficulty in walking, not elsewhere classified: Secondary | ICD-10-CM | POA: Diagnosis not present

## 2016-11-21 DIAGNOSIS — I5022 Chronic systolic (congestive) heart failure: Secondary | ICD-10-CM | POA: Diagnosis not present

## 2016-11-22 ENCOUNTER — Other Ambulatory Visit: Payer: Self-pay | Admitting: *Deleted

## 2016-11-22 DIAGNOSIS — N183 Chronic kidney disease, stage 3 (moderate): Secondary | ICD-10-CM | POA: Diagnosis not present

## 2016-11-22 DIAGNOSIS — D649 Anemia, unspecified: Secondary | ICD-10-CM | POA: Diagnosis not present

## 2016-11-22 DIAGNOSIS — Z95810 Presence of automatic (implantable) cardiac defibrillator: Secondary | ICD-10-CM | POA: Diagnosis not present

## 2016-11-22 DIAGNOSIS — R2689 Other abnormalities of gait and mobility: Secondary | ICD-10-CM | POA: Diagnosis not present

## 2016-11-22 DIAGNOSIS — M1380 Other specified arthritis, unspecified site: Secondary | ICD-10-CM | POA: Diagnosis not present

## 2016-11-22 DIAGNOSIS — M6281 Muscle weakness (generalized): Secondary | ICD-10-CM | POA: Diagnosis not present

## 2016-11-22 DIAGNOSIS — E118 Type 2 diabetes mellitus with unspecified complications: Secondary | ICD-10-CM | POA: Diagnosis not present

## 2016-11-22 DIAGNOSIS — R262 Difficulty in walking, not elsewhere classified: Secondary | ICD-10-CM | POA: Diagnosis not present

## 2016-11-22 DIAGNOSIS — I2510 Atherosclerotic heart disease of native coronary artery without angina pectoris: Secondary | ICD-10-CM | POA: Diagnosis not present

## 2016-11-22 DIAGNOSIS — R2681 Unsteadiness on feet: Secondary | ICD-10-CM | POA: Diagnosis not present

## 2016-11-22 DIAGNOSIS — M25561 Pain in right knee: Secondary | ICD-10-CM | POA: Diagnosis not present

## 2016-11-22 DIAGNOSIS — E1122 Type 2 diabetes mellitus with diabetic chronic kidney disease: Secondary | ICD-10-CM | POA: Diagnosis not present

## 2016-11-22 DIAGNOSIS — I5022 Chronic systolic (congestive) heart failure: Secondary | ICD-10-CM | POA: Diagnosis not present

## 2016-11-22 DIAGNOSIS — I639 Cerebral infarction, unspecified: Secondary | ICD-10-CM | POA: Diagnosis not present

## 2016-11-22 NOTE — Patient Outreach (Signed)
[facility]) Care Management  11/22/2016  SAFEER GILLMAN [birth year] [phone]  Confirmed with Becky that patient is LTC patient at facility, no plans to discharge home at this time. Alben Spittle. Albertha Ghee, RN, BSN, CCM  Post Acute Chartered loss adjuster [phone]) Business Cell  [phone]) Toll Free Office

## 2016-11-23 ENCOUNTER — Telehealth: Payer: Self-pay | Admitting: Cardiology

## 2016-11-23 DIAGNOSIS — I2510 Atherosclerotic heart disease of native coronary artery without angina pectoris: Secondary | ICD-10-CM | POA: Diagnosis not present

## 2016-11-23 DIAGNOSIS — E1122 Type 2 diabetes mellitus with diabetic chronic kidney disease: Secondary | ICD-10-CM | POA: Diagnosis not present

## 2016-11-23 DIAGNOSIS — R2689 Other abnormalities of gait and mobility: Secondary | ICD-10-CM | POA: Diagnosis not present

## 2016-11-23 DIAGNOSIS — D649 Anemia, unspecified: Secondary | ICD-10-CM | POA: Diagnosis not present

## 2016-11-23 DIAGNOSIS — R2681 Unsteadiness on feet: Secondary | ICD-10-CM | POA: Diagnosis not present

## 2016-11-23 DIAGNOSIS — Z95810 Presence of automatic (implantable) cardiac defibrillator: Secondary | ICD-10-CM | POA: Diagnosis not present

## 2016-11-23 DIAGNOSIS — M25561 Pain in right knee: Secondary | ICD-10-CM | POA: Diagnosis not present

## 2016-11-23 DIAGNOSIS — M6281 Muscle weakness (generalized): Secondary | ICD-10-CM | POA: Diagnosis not present

## 2016-11-23 DIAGNOSIS — I5022 Chronic systolic (congestive) heart failure: Secondary | ICD-10-CM | POA: Diagnosis not present

## 2016-11-23 DIAGNOSIS — M1380 Other specified arthritis, unspecified site: Secondary | ICD-10-CM | POA: Diagnosis not present

## 2016-11-23 DIAGNOSIS — R262 Difficulty in walking, not elsewhere classified: Secondary | ICD-10-CM | POA: Diagnosis not present

## 2016-11-23 DIAGNOSIS — N183 Chronic kidney disease, stage 3 (moderate): Secondary | ICD-10-CM | POA: Diagnosis not present

## 2016-11-23 NOTE — Telephone Encounter (Signed)
Spoke w/ Nursing Supervisor Marylene Land and informed her that a Representative from [facility] will be by sometime on Friday 11-25-2016 to fix patient home monitor. She verbalized understanding.

## 2016-11-24 DIAGNOSIS — E1122 Type 2 diabetes mellitus with diabetic chronic kidney disease: Secondary | ICD-10-CM | POA: Diagnosis not present

## 2016-11-24 DIAGNOSIS — N183 Chronic kidney disease, stage 3 (moderate): Secondary | ICD-10-CM | POA: Diagnosis not present

## 2016-11-24 DIAGNOSIS — M1380 Other specified arthritis, unspecified site: Secondary | ICD-10-CM | POA: Diagnosis not present

## 2016-11-24 DIAGNOSIS — Z95810 Presence of automatic (implantable) cardiac defibrillator: Secondary | ICD-10-CM | POA: Diagnosis not present

## 2016-11-24 DIAGNOSIS — I5022 Chronic systolic (congestive) heart failure: Secondary | ICD-10-CM | POA: Diagnosis not present

## 2016-11-24 DIAGNOSIS — R2681 Unsteadiness on feet: Secondary | ICD-10-CM | POA: Diagnosis not present

## 2016-11-24 DIAGNOSIS — R262 Difficulty in walking, not elsewhere classified: Secondary | ICD-10-CM | POA: Diagnosis not present

## 2016-11-24 DIAGNOSIS — I2510 Atherosclerotic heart disease of native coronary artery without angina pectoris: Secondary | ICD-10-CM | POA: Diagnosis not present

## 2016-11-24 DIAGNOSIS — M25561 Pain in right knee: Secondary | ICD-10-CM | POA: Diagnosis not present

## 2016-11-24 DIAGNOSIS — R2689 Other abnormalities of gait and mobility: Secondary | ICD-10-CM | POA: Diagnosis not present

## 2016-11-24 DIAGNOSIS — D649 Anemia, unspecified: Secondary | ICD-10-CM | POA: Diagnosis not present

## 2016-11-24 DIAGNOSIS — M6281 Muscle weakness (generalized): Secondary | ICD-10-CM | POA: Diagnosis not present

## 2016-11-25 DIAGNOSIS — R262 Difficulty in walking, not elsewhere classified: Secondary | ICD-10-CM | POA: Diagnosis not present

## 2016-11-25 DIAGNOSIS — Z95810 Presence of automatic (implantable) cardiac defibrillator: Secondary | ICD-10-CM | POA: Diagnosis not present

## 2016-11-25 DIAGNOSIS — I2510 Atherosclerotic heart disease of native coronary artery without angina pectoris: Secondary | ICD-10-CM | POA: Diagnosis not present

## 2016-11-25 DIAGNOSIS — D649 Anemia, unspecified: Secondary | ICD-10-CM | POA: Diagnosis not present

## 2016-11-25 DIAGNOSIS — M6281 Muscle weakness (generalized): Secondary | ICD-10-CM | POA: Diagnosis not present

## 2016-11-25 DIAGNOSIS — N183 Chronic kidney disease, stage 3 (moderate): Secondary | ICD-10-CM | POA: Diagnosis not present

## 2016-11-25 DIAGNOSIS — M25561 Pain in right knee: Secondary | ICD-10-CM | POA: Diagnosis not present

## 2016-11-25 DIAGNOSIS — I5022 Chronic systolic (congestive) heart failure: Secondary | ICD-10-CM | POA: Diagnosis not present

## 2016-11-25 DIAGNOSIS — E1122 Type 2 diabetes mellitus with diabetic chronic kidney disease: Secondary | ICD-10-CM | POA: Diagnosis not present

## 2016-11-25 DIAGNOSIS — M1380 Other specified arthritis, unspecified site: Secondary | ICD-10-CM | POA: Diagnosis not present

## 2016-11-25 DIAGNOSIS — R2681 Unsteadiness on feet: Secondary | ICD-10-CM | POA: Diagnosis not present

## 2016-11-25 DIAGNOSIS — R2689 Other abnormalities of gait and mobility: Secondary | ICD-10-CM | POA: Diagnosis not present

## 2016-11-27 DIAGNOSIS — I5022 Chronic systolic (congestive) heart failure: Secondary | ICD-10-CM | POA: Diagnosis not present

## 2016-11-27 DIAGNOSIS — E1122 Type 2 diabetes mellitus with diabetic chronic kidney disease: Secondary | ICD-10-CM | POA: Diagnosis not present

## 2016-11-27 DIAGNOSIS — R2681 Unsteadiness on feet: Secondary | ICD-10-CM | POA: Diagnosis not present

## 2016-11-27 DIAGNOSIS — R262 Difficulty in walking, not elsewhere classified: Secondary | ICD-10-CM | POA: Diagnosis not present

## 2016-11-27 DIAGNOSIS — M25561 Pain in right knee: Secondary | ICD-10-CM | POA: Diagnosis not present

## 2016-11-27 DIAGNOSIS — M1380 Other specified arthritis, unspecified site: Secondary | ICD-10-CM | POA: Diagnosis not present

## 2016-11-27 DIAGNOSIS — M6281 Muscle weakness (generalized): Secondary | ICD-10-CM | POA: Diagnosis not present

## 2016-11-27 DIAGNOSIS — D649 Anemia, unspecified: Secondary | ICD-10-CM | POA: Diagnosis not present

## 2016-11-27 DIAGNOSIS — R2689 Other abnormalities of gait and mobility: Secondary | ICD-10-CM | POA: Diagnosis not present

## 2016-11-27 DIAGNOSIS — I2510 Atherosclerotic heart disease of native coronary artery without angina pectoris: Secondary | ICD-10-CM | POA: Diagnosis not present

## 2016-11-27 DIAGNOSIS — N183 Chronic kidney disease, stage 3 (moderate): Secondary | ICD-10-CM | POA: Diagnosis not present

## 2016-11-27 DIAGNOSIS — Z95810 Presence of automatic (implantable) cardiac defibrillator: Secondary | ICD-10-CM | POA: Diagnosis not present

## 2016-11-28 DIAGNOSIS — E1122 Type 2 diabetes mellitus with diabetic chronic kidney disease: Secondary | ICD-10-CM | POA: Diagnosis not present

## 2016-11-28 DIAGNOSIS — N183 Chronic kidney disease, stage 3 (moderate): Secondary | ICD-10-CM | POA: Diagnosis not present

## 2016-11-28 DIAGNOSIS — Z95810 Presence of automatic (implantable) cardiac defibrillator: Secondary | ICD-10-CM | POA: Diagnosis not present

## 2016-11-28 DIAGNOSIS — M25561 Pain in right knee: Secondary | ICD-10-CM | POA: Diagnosis not present

## 2016-11-28 DIAGNOSIS — I5022 Chronic systolic (congestive) heart failure: Secondary | ICD-10-CM | POA: Diagnosis not present

## 2016-11-28 DIAGNOSIS — I2510 Atherosclerotic heart disease of native coronary artery without angina pectoris: Secondary | ICD-10-CM | POA: Diagnosis not present

## 2016-11-28 DIAGNOSIS — R262 Difficulty in walking, not elsewhere classified: Secondary | ICD-10-CM | POA: Diagnosis not present

## 2016-11-28 DIAGNOSIS — M1380 Other specified arthritis, unspecified site: Secondary | ICD-10-CM | POA: Diagnosis not present

## 2016-11-28 DIAGNOSIS — R2689 Other abnormalities of gait and mobility: Secondary | ICD-10-CM | POA: Diagnosis not present

## 2016-11-28 DIAGNOSIS — D649 Anemia, unspecified: Secondary | ICD-10-CM | POA: Diagnosis not present

## 2016-11-28 DIAGNOSIS — M6281 Muscle weakness (generalized): Secondary | ICD-10-CM | POA: Diagnosis not present

## 2016-11-28 DIAGNOSIS — R2681 Unsteadiness on feet: Secondary | ICD-10-CM | POA: Diagnosis not present

## 2016-11-29 DIAGNOSIS — R262 Difficulty in walking, not elsewhere classified: Secondary | ICD-10-CM | POA: Diagnosis not present

## 2016-11-29 DIAGNOSIS — M6281 Muscle weakness (generalized): Secondary | ICD-10-CM | POA: Diagnosis not present

## 2016-11-29 DIAGNOSIS — Z95810 Presence of automatic (implantable) cardiac defibrillator: Secondary | ICD-10-CM | POA: Diagnosis not present

## 2016-11-29 DIAGNOSIS — I5022 Chronic systolic (congestive) heart failure: Secondary | ICD-10-CM | POA: Diagnosis not present

## 2016-11-29 DIAGNOSIS — R2689 Other abnormalities of gait and mobility: Secondary | ICD-10-CM | POA: Diagnosis not present

## 2016-11-29 DIAGNOSIS — R2681 Unsteadiness on feet: Secondary | ICD-10-CM | POA: Diagnosis not present

## 2016-11-29 DIAGNOSIS — M25561 Pain in right knee: Secondary | ICD-10-CM | POA: Diagnosis not present

## 2016-11-29 DIAGNOSIS — E1122 Type 2 diabetes mellitus with diabetic chronic kidney disease: Secondary | ICD-10-CM | POA: Diagnosis not present

## 2016-11-29 DIAGNOSIS — I2510 Atherosclerotic heart disease of native coronary artery without angina pectoris: Secondary | ICD-10-CM | POA: Diagnosis not present

## 2016-11-29 DIAGNOSIS — N183 Chronic kidney disease, stage 3 (moderate): Secondary | ICD-10-CM | POA: Diagnosis not present

## 2016-11-29 DIAGNOSIS — M1380 Other specified arthritis, unspecified site: Secondary | ICD-10-CM | POA: Diagnosis not present

## 2016-11-29 DIAGNOSIS — D649 Anemia, unspecified: Secondary | ICD-10-CM | POA: Diagnosis not present

## 2016-11-30 DIAGNOSIS — M25561 Pain in right knee: Secondary | ICD-10-CM | POA: Diagnosis not present

## 2016-11-30 DIAGNOSIS — R262 Difficulty in walking, not elsewhere classified: Secondary | ICD-10-CM | POA: Diagnosis not present

## 2016-11-30 DIAGNOSIS — Z95810 Presence of automatic (implantable) cardiac defibrillator: Secondary | ICD-10-CM | POA: Diagnosis not present

## 2016-11-30 DIAGNOSIS — G301 Alzheimer's disease with late onset: Secondary | ICD-10-CM | POA: Diagnosis not present

## 2016-11-30 DIAGNOSIS — I5022 Chronic systolic (congestive) heart failure: Secondary | ICD-10-CM | POA: Diagnosis not present

## 2016-11-30 DIAGNOSIS — E1122 Type 2 diabetes mellitus with diabetic chronic kidney disease: Secondary | ICD-10-CM | POA: Diagnosis not present

## 2016-11-30 DIAGNOSIS — M6281 Muscle weakness (generalized): Secondary | ICD-10-CM | POA: Diagnosis not present

## 2016-11-30 DIAGNOSIS — N183 Chronic kidney disease, stage 3 (moderate): Secondary | ICD-10-CM | POA: Diagnosis not present

## 2016-11-30 DIAGNOSIS — R2681 Unsteadiness on feet: Secondary | ICD-10-CM | POA: Diagnosis not present

## 2016-11-30 DIAGNOSIS — D649 Anemia, unspecified: Secondary | ICD-10-CM | POA: Diagnosis not present

## 2016-11-30 DIAGNOSIS — I2510 Atherosclerotic heart disease of native coronary artery without angina pectoris: Secondary | ICD-10-CM | POA: Diagnosis not present

## 2016-11-30 DIAGNOSIS — M1380 Other specified arthritis, unspecified site: Secondary | ICD-10-CM | POA: Diagnosis not present

## 2016-11-30 DIAGNOSIS — I639 Cerebral infarction, unspecified: Secondary | ICD-10-CM | POA: Diagnosis not present

## 2016-11-30 DIAGNOSIS — R2689 Other abnormalities of gait and mobility: Secondary | ICD-10-CM | POA: Diagnosis not present

## 2016-12-01 DIAGNOSIS — E1122 Type 2 diabetes mellitus with diabetic chronic kidney disease: Secondary | ICD-10-CM | POA: Diagnosis not present

## 2016-12-01 DIAGNOSIS — I2510 Atherosclerotic heart disease of native coronary artery without angina pectoris: Secondary | ICD-10-CM | POA: Diagnosis not present

## 2016-12-01 DIAGNOSIS — R2689 Other abnormalities of gait and mobility: Secondary | ICD-10-CM | POA: Diagnosis not present

## 2016-12-01 DIAGNOSIS — R2681 Unsteadiness on feet: Secondary | ICD-10-CM | POA: Diagnosis not present

## 2016-12-01 DIAGNOSIS — M1380 Other specified arthritis, unspecified site: Secondary | ICD-10-CM | POA: Diagnosis not present

## 2016-12-01 DIAGNOSIS — M6281 Muscle weakness (generalized): Secondary | ICD-10-CM | POA: Diagnosis not present

## 2016-12-01 DIAGNOSIS — I5022 Chronic systolic (congestive) heart failure: Secondary | ICD-10-CM | POA: Diagnosis not present

## 2016-12-01 DIAGNOSIS — M25561 Pain in right knee: Secondary | ICD-10-CM | POA: Diagnosis not present

## 2016-12-01 DIAGNOSIS — Z95810 Presence of automatic (implantable) cardiac defibrillator: Secondary | ICD-10-CM | POA: Diagnosis not present

## 2016-12-01 DIAGNOSIS — D649 Anemia, unspecified: Secondary | ICD-10-CM | POA: Diagnosis not present

## 2016-12-01 DIAGNOSIS — R262 Difficulty in walking, not elsewhere classified: Secondary | ICD-10-CM | POA: Diagnosis not present

## 2016-12-01 DIAGNOSIS — N183 Chronic kidney disease, stage 3 (moderate): Secondary | ICD-10-CM | POA: Diagnosis not present

## 2016-12-02 DIAGNOSIS — N183 Chronic kidney disease, stage 3 (moderate): Secondary | ICD-10-CM | POA: Diagnosis not present

## 2016-12-02 DIAGNOSIS — E1122 Type 2 diabetes mellitus with diabetic chronic kidney disease: Secondary | ICD-10-CM | POA: Diagnosis not present

## 2016-12-02 DIAGNOSIS — D649 Anemia, unspecified: Secondary | ICD-10-CM | POA: Diagnosis not present

## 2016-12-02 DIAGNOSIS — R2689 Other abnormalities of gait and mobility: Secondary | ICD-10-CM | POA: Diagnosis not present

## 2016-12-02 DIAGNOSIS — R2681 Unsteadiness on feet: Secondary | ICD-10-CM | POA: Diagnosis not present

## 2016-12-02 DIAGNOSIS — I5022 Chronic systolic (congestive) heart failure: Secondary | ICD-10-CM | POA: Diagnosis not present

## 2016-12-02 DIAGNOSIS — I2510 Atherosclerotic heart disease of native coronary artery without angina pectoris: Secondary | ICD-10-CM | POA: Diagnosis not present

## 2016-12-02 DIAGNOSIS — M1380 Other specified arthritis, unspecified site: Secondary | ICD-10-CM | POA: Diagnosis not present

## 2016-12-02 DIAGNOSIS — Z95810 Presence of automatic (implantable) cardiac defibrillator: Secondary | ICD-10-CM | POA: Diagnosis not present

## 2016-12-02 DIAGNOSIS — M25561 Pain in right knee: Secondary | ICD-10-CM | POA: Diagnosis not present

## 2016-12-02 DIAGNOSIS — R262 Difficulty in walking, not elsewhere classified: Secondary | ICD-10-CM | POA: Diagnosis not present

## 2016-12-02 DIAGNOSIS — M6281 Muscle weakness (generalized): Secondary | ICD-10-CM | POA: Diagnosis not present

## 2016-12-03 ENCOUNTER — Emergency Department (HOSPITAL_COMMUNITY): Payer: Medicare Other

## 2016-12-03 ENCOUNTER — Emergency Department (HOSPITAL_COMMUNITY)
Admission: EM | Admit: 2016-12-03 | Discharge: 2016-12-04 | Disposition: A | Discharge status: Home / Self Care | Payer: Medicare Other | Attending: Emergency Medicine | Admitting: Emergency Medicine

## 2016-12-03 DIAGNOSIS — I509 Heart failure, unspecified: Secondary | ICD-10-CM | POA: Insufficient documentation

## 2016-12-03 DIAGNOSIS — F0390 Unspecified dementia without behavioral disturbance: Secondary | ICD-10-CM | POA: Diagnosis not present

## 2016-12-03 DIAGNOSIS — Z96641 Presence of right artificial hip joint: Secondary | ICD-10-CM | POA: Insufficient documentation

## 2016-12-03 DIAGNOSIS — Z7984 Long term (current) use of oral hypoglycemic drugs: Secondary | ICD-10-CM | POA: Diagnosis not present

## 2016-12-03 DIAGNOSIS — W19XXXA Unspecified fall, initial encounter: Secondary | ICD-10-CM | POA: Insufficient documentation

## 2016-12-03 DIAGNOSIS — I130 Hypertensive heart and chronic kidney disease with heart failure and stage 1 through stage 4 chronic kidney disease, or unspecified chronic kidney disease: Secondary | ICD-10-CM | POA: Insufficient documentation

## 2016-12-03 DIAGNOSIS — S20219A Contusion of unspecified front wall of thorax, initial encounter: Secondary | ICD-10-CM | POA: Diagnosis not present

## 2016-12-03 DIAGNOSIS — S59902A Unspecified injury of left elbow, initial encounter: Secondary | ICD-10-CM | POA: Diagnosis not present

## 2016-12-03 DIAGNOSIS — Z79899 Other long term (current) drug therapy: Secondary | ICD-10-CM | POA: Diagnosis not present

## 2016-12-03 DIAGNOSIS — S20212A Contusion of left front wall of thorax, initial encounter: Secondary | ICD-10-CM | POA: Diagnosis not present

## 2016-12-03 DIAGNOSIS — Y999 Unspecified external cause status: Secondary | ICD-10-CM | POA: Insufficient documentation

## 2016-12-03 DIAGNOSIS — T148XXA Other injury of unspecified body region, initial encounter: Secondary | ICD-10-CM | POA: Diagnosis not present

## 2016-12-03 DIAGNOSIS — S299XXA Unspecified injury of thorax, initial encounter: Secondary | ICD-10-CM | POA: Diagnosis not present

## 2016-12-03 DIAGNOSIS — S199XXA Unspecified injury of neck, initial encounter: Secondary | ICD-10-CM | POA: Diagnosis not present

## 2016-12-03 DIAGNOSIS — M25522 Pain in left elbow: Secondary | ICD-10-CM | POA: Diagnosis not present

## 2016-12-03 DIAGNOSIS — N183 Chronic kidney disease, stage 3 (moderate): Secondary | ICD-10-CM | POA: Insufficient documentation

## 2016-12-03 DIAGNOSIS — R0781 Pleurodynia: Secondary | ICD-10-CM | POA: Diagnosis not present

## 2016-12-03 DIAGNOSIS — E119 Type 2 diabetes mellitus without complications: Secondary | ICD-10-CM | POA: Insufficient documentation

## 2016-12-03 DIAGNOSIS — Y929 Unspecified place or not applicable: Secondary | ICD-10-CM | POA: Insufficient documentation

## 2016-12-03 DIAGNOSIS — Y939 Activity, unspecified: Secondary | ICD-10-CM | POA: Insufficient documentation

## 2016-12-03 DIAGNOSIS — S0990XA Unspecified injury of head, initial encounter: Secondary | ICD-10-CM | POA: Diagnosis not present

## 2016-12-03 DIAGNOSIS — M25512 Pain in left shoulder: Secondary | ICD-10-CM | POA: Diagnosis not present

## 2016-12-03 NOTE — ED Provider Notes (Signed)
[facility]-EMERGENCY DEPT Provider Note   CSN: [id number] Arrival date & time: 12/03/16  Jul 29, 2251     History   Chief Complaint Chief Complaint  Patient presents with  . Fall   HPI   Blood pressure (!) 162/69, pulse 60, temperature 98.7 F (37.1 C), temperature source Oral, resp. rate 16, SpO2 100 %.  [name] is a 76 y.o. male with past medical history significant for DVT, atrial flutter (anticoagulated with Xarelto as per chart), type 2 diabetes and dementia, CAD with cardioverter defibrillator complaining of mechanical fall earlier in the day when he was trying to sit down on a rolling chair, he states that the chair wheels were locked however he somehow missed the chair and fell on the ground he fell onto his left side he has pain in his left posterior ribs and left elbow. He hit his head that he did not pass out, there was no chest pain, palpitations, loss of consciousness, neck pain, numbness weakness, exacerbation of the pain with deep breathing, abdominal pain or pain in the hips or lower extremities.   Past Medical History:  Diagnosis Date  . Acute encephalopathy   . AICD (automatic cardioverter/defibrillator) present   . Anemia   . Arthritis    "all over" (05/16/2013)  . Atrial flutter (HCC)   . CAD (coronary artery disease)   . Cardiomyopathy (HCC)    a. 10/2012 Echo: EF 20-25%, diff HK, mod dil LA/RV/RA.  Marland Kitchen Cervical myelopathy (HCC)   . Cervical spinal stenosis   . Chronic systolic congestive heart failure (HCC)   . CKD (chronic kidney disease), stage III   . 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 CVA (cerebral vascular accident) (HCC)   . Decreased vision of right eye   . Dementia   . Depression    "son died in service in 1990-07-29; still bothers me" (05/16/2013)  . DVT (deep venous thrombosis) (HCC) 10/2011   RUE; pt denies this hx on 05/16/2013  . Dysrhythmia   . Frequent falls   . GERD  (gastroesophageal reflux disease)   . H/O hiatal hernia   . Hyperlipidemia   . Hypertension   . Hypoglycemia   . Lumbar spinal stenosis   . Mononeuritis of unspecified site   . Myocardial infarction (HCC)   . Pneumonia 1960   , also 2014  . Quadriparesis (HCC) 07/29/2011   pre op  . Shortness of breath dyspnea    with exertion  . Type II diabetes mellitus (HCC)    type 2  . Venous insufficiency     Patient Active Problem List   Diagnosis Date Noted  . Dementia with behavioral disturbance 03/29/2016  . CKD (chronic kidney disease) stage 3, GFR 30-59 ml/min 03/28/2016  . Diabetes (HCC) 03/27/2016  . AKI (acute kidney injury) (HCC) 03/27/2016  . Adjustment disorder with depressed mood 12/25/2015  . Memory deficits 12/01/2015  . Encephalopathy, metabolic 11/28/2015  . Acute left PCA stroke (HCC) 11/25/2015  . Wound dehiscence 03/03/2015  . Lumbar stenosis with neurogenic claudication 02/02/2015  . Cervical spondylosis with myelopathy 04/17/2014  . ICD (implantable cardioverter-defibrillator), biventricular, in situ 11/19/2013  . Cardiac resynchronization therapy defibrillator (CRT-D) in place 07/03/2013  . Chronic renal failure 01/24/2013  . CAD (coronary artery disease) 12/10/2012  . Chronic systolic heart failure (HCC) 11/20/2012  . Cardiomyopathy, ischemic 11/20/2012  . Atrial flutter (HCC) 11/08/2012  . Dilated cardiomyopathy (HCC)  11/01/2012  . Cervical stenosis of spine 10/28/2011  . Spondylosis of cervical joint 10/28/2011  . Cervical myelopathy (HCC) 10/28/2011  . Quadriparesis (HCC) 10/28/2011  . DVT of right proximal brachial through the distal axillary vein, acute 10/06/2011  . Constipation due to pain medication 10/06/2011  . Venous insufficiency 10/05/2011  . 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 Hyperlipidemia 10/05/2011  . Normocytic anemia 10/03/2011  . Falls frequently 10/03/2011  .  Diabetes mellitus with chronic kidney disease (HCC) 10/03/2011  . HTN (hypertension) 10/03/2011  . Gait abnormality secondary to lumbar spinal stenosis 10/03/2011  . Arthritis 10/03/2011    Past Surgical History:  Procedure Laterality Date  . ANTERIOR CERVICAL DECOMP/DISCECTOMY FUSION  10/31/2011   Procedure: ANTERIOR CERVICAL DECOMPRESSION/DISCECTOMY FUSION 2 LEVELS;  Surgeon: Cristi Loron, MD;  Location: [facility];  Service: Neurosurgery;  Laterality: N/A;  Cervical Four-Five Cervical Five-Six Anterior Cervical Decompression with fusion interbody prothesis plating and bonegraft  . ANTERIOR CERVICAL DECOMP/DISCECTOMY FUSION N/A 04/17/2014   Procedure: CERVICAL THREE TO FOUR ANTERIOR CERVICAL DECOMPRESSION/DISCECTOMY FUSION 1 LEVEL/HARDWARE REMOVAL;  Surgeon: Tressie Stalker, MD;  Location: [facility];  Service: Neurosurgery;  Laterality: N/A;  C34 anterior cervical decompression with fusion interbody prosthesis plating and bonegraft with exploration of prev fusion and possible removal of old hardware  . BI-VENTRICULAR IMPLANTABLE CARDIOVERTER DEFIBRILLATOR N/A 05/16/2013   Procedure: BI-VENTRICULAR IMPLANTABLE CARDIOVERTER DEFIBRILLATOR  (CRT-D);  Surgeon: Marinus Maw, MD;  Location: [facility] CATH LAB;  Service: Cardiovascular;  Laterality: N/A;  . BI-VENTRICULAR IMPLANTABLE CARDIOVERTER DEFIBRILLATOR  (CRT-D) Left 05/16/2013   STJ CRTD implanted by Dr Ladona Ridgel for primary prevention/CHF  . CARDIAC CATHETERIZATION  10/2012  . CARPAL TUNNEL RELEASE Right ~ 2013  . CATARACT EXTRACTION W/ INTRAOCULAR LENS IMPLANT Bilateral   . JOINT REPLACEMENT  2013  . LEFT AND RIGHT HEART CATHETERIZATION WITH CORONARY ANGIOGRAM N/A 11/06/2012   Procedure: LEFT AND RIGHT HEART CATHETERIZATION WITH CORONARY ANGIOGRAM;  Surgeon: Laurey Morale, MD;  Location: [facility] CATH LAB;  Service: Cardiovascular;  Laterality: N/A;  . LUMBAR LAMINECTOMY/DECOMPRESSION MICRODISCECTOMY N/A 02/02/2015   Procedure: LUMBAR  LAMINECTOMY/DECOMPRESSION MICRODISCECTOMY 2 LEVELS;  Surgeon: Tressie Stalker, MD;  Location: [facility];  Service: Neurosurgery;  Laterality: N/A;  L34 L45 laminectomy and foraminotomy  . LUMBAR WOUND DEBRIDEMENT N/A 03/04/2015   Procedure: Incision and Drainage of LUMBAR WOUND ;  Surgeon: Tressie Stalker, MD;  Location: [facility];  Service: Neurosurgery;  Laterality: N/A;  . 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 toe removal  Left    3rd and 4th toe removed  . TOTAL HIP ARTHROPLASTY Right ~ 2012       Home Medications    Prior to Admission medications   Medication Sig Start Date End Date Taking? Authorizing Provider  Calcium Carbonate Antacid (TUMS PO) Take 1 tablet by mouth 2 (two) times daily as needed (indigestion).    [provider]  carvedilol (COREG) 6.25 MG tablet Take 1 tablet (6.25 mg total) by mouth 2 (two) times daily. 09/05/16   Marinus Maw, MD  divalproex (DEPAKOTE) 125 MG DR tablet Take 125 mg by mouth 2 (two) times daily.    [provider]  escitalopram (LEXAPRO) 10 MG tablet Take 1 tablet (10 mg total) by mouth daily. 11/17/16   Laveda Abbe, NP  fluticasone [name]) 50 MCG/ACT nasal spray Place 2 sprays into both nostrils daily as needed for  allergies. 12/16/15   Sharon Seller, NP  gabapentin (NEURONTIN) 100 MG capsule Take 100 mg by mouth 2 (two) times daily.    [provider]  gabapentin (NEURONTIN) 100 MG capsule Take 2 capsules (200 mg total) by mouth 2 (two) times daily. 11/16/16   Laveda Abbe, NP  hydrALAZINE (APRESOLINE) 25 MG tablet TAKE ONE-HALF TABLET BY MOUTH THREE TIMES DAILY Patient taking differently: Take 12.5 mg by mouth 3 (three) times daily.  12/16/15   Sharon Seller, NP  ipratropium (ATROVENT) 0.06 % nasal spray Place 2 sprays into both nostrils 4 (four) times daily.    [provider]  isosorbide mononitrate (IMDUR) 30 MG 24 hr tablet Take 1 tablet (30 mg  total) by mouth daily. 12/16/15   Sharon Seller, NP  ivabradine (CORLANOR) 5 MG TABS tablet Take 1 tablet (5 mg total) by mouth 2 (two) times daily with a meal. 12/16/15   Sharon Seller, NP  magnesium hydroxide (MILK OF MAGNESIA) 400 MG/5ML suspension Take 30 mLs by mouth daily as needed. Patient taking differently: Take 30 mLs by mouth daily as needed for mild constipation.  12/16/15   Sharon Seller, NP  menthol-zinc oxide (GOLD BOND) powder Apply 1 application topically 2 (two) times daily as needed (jock itch).    [provider]  metFORMIN (GLUCOPHAGE) 1000 MG tablet Take 1,000 mg by mouth 2 (two) times daily with a meal.    [provider]  QUEtiapine (SEROQUEL) 25 MG tablet Take 75 mg by mouth at bedtime.    [provider]  QUEtiapine (SEROQUEL) 25 MG tablet Take 3 tablets (75 mg total) by mouth at bedtime. 11/16/16   Laveda Abbe, NP  Rivaroxaban (XARELTO) 15 MG TABS tablet Take 1 tablet (15 mg total) by mouth daily with supper. 12/16/15   Sharon Seller, NP  rosuvastatin (CRESTOR) 10 MG tablet Take 1 tablet (10 mg total) by mouth daily at 6 PM. 12/16/15   Janyth Contes, Janene Harvey, NP  silver sulfADIAZINE (SILVADENE) 1 % cream Apply 1 application topically daily as needed (wound care).    [provider]  sodium chloride (DEEP SEA NASAL SPRAY) 0.65 % nasal spray Place 2 sprays into the nose every 2 (two) hours as needed for congestion. 12/16/15   Sharon Seller, NP  traMADol (ULTRAM) 50 MG tablet Take 50 mg by mouth every 8 (eight) hours as needed for moderate pain.    [provider]  traZODone (DESYREL) 50 MG tablet Take 1 tablet (50 mg total) by mouth at bedtime as needed for sleep. Patient not taking: Reported on 11/13/2016 12/25/15   Charm Rings, NP    Family History Family History  Problem Relation Age of Onset  . Diabetes Brother   . Cancer Father        Lung cancer  . Heart attack Mother        pt thinks she had  MI  . Cancer Mother     Social History Social History  Substance Use Topics  . Smoking status: Never Smoker  . Smokeless tobacco: Never Used  . Alcohol use No     Comment: 05/16/2013 "used to drink a little bit a long time ago; nothing in over 30 yrs"     Allergies   Acyclovir and related; Dristan cold [chlorphen-pe-acetaminophen]; and Prednisone   Review of Systems Review of Systems  A complete review of systems was obtained and all systems are negative except as noted  in the HPI and PMH.    Physical Exam Updated Vital Signs BP (!) 162/69 (BP Location: Left Arm)   Pulse 60   Temp 98.7 F (37.1 C) (Oral)   Resp 16   SpO2 100%   Physical Exam  Constitutional: He is oriented to person, place, and time. He appears well-developed and well-nourished. No distress.  HENT:  Head: Normocephalic and atraumatic.  Mouth/Throat: Oropharynx is clear and moist.  Eyes: Pupils are equal, round, and reactive to light. Conjunctivae and EOM are normal.  Neck: Normal range of motion.  Cardiovascular: Normal rate, regular rhythm and intact distal pulses.   Pulmonary/Chest: Effort normal and breath sounds normal. No respiratory distress. He has no wheezes. He has no rales. He exhibits tenderness. He exhibits no crepitus.    Tender to palpation as diagrammed with full lung sounds no underlying crepitance. No ecchymoses.  Abdominal: Soft. There is no tenderness.  Musculoskeletal: Normal range of motion.  Patient reports tenderness in the left elbow however there is no focal bony tenderness supinate and pronate without complication. Neurovascular intact. No snuffbox tenderness bilaterally.  Neurological: He is alert and oriented to person, place, and time.  Skin: He is not diaphoretic.  Psychiatric: He has a normal mood and affect.  Nursing note and vitals reviewed.    ED Treatments / Results  Labs (all labs ordered are listed, but only abnormal results are displayed) Labs Reviewed -  No data to display  EKG  EKG Interpretation None       Radiology Dg Ribs Unilateral W/chest Left  Result Date: 12/03/2016 CLINICAL DATA:  Pain after falling onto left side while trying to sit in a wheelchair. EXAM: LEFT RIBS AND CHEST - 3+ VIEW COMPARISON:  11/14/2016 FINDINGS: Left rib detail images are negative for displaced fracture. There is stable left hemidiaphragm elevation. Mild chronic linear scarring in the bases. The lungs are otherwise clear. No pneumothorax. No effusion. Normal mediastinal contours. Grossly intact transvenous cardiac leads. IMPRESSION: No acute findings. Electronically Signed   By: Ellery Plunk M.D.   On: 12/03/2016 23:45   Dg Elbow Complete Left  Result Date: 12/03/2016 CLINICAL DATA:  Pain after a fall EXAM: LEFT ELBOW - COMPLETE 3+ VIEW COMPARISON:  None. FINDINGS: No acute displaced fracture or malalignment. No significant elbow effusion. Spur off the olecranon. IMPRESSION: No definite acute osseous abnormality. Electronically Signed   By: Jasmine Pang M.D.   On: 12/03/2016 23:43   Ct Head Wo Contrast  Result Date: 12/03/2016 CLINICAL DATA:  76 y/o M; Head trauma, minor, GCS>=13, low clinical risk, initial exam; C-spine trauma, low clinical risk. EXAM: CT HEAD WITHOUT CONTRAST CT CERVICAL SPINE WITHOUT CONTRAST TECHNIQUE: Multidetector CT imaging of the head and cervical spine was performed following the standard protocol without intravenous contrast. Multiplanar CT image reconstructions of the cervical spine were also generated. COMPARISON:  10/14/2016 CT of the head and cervical spine. 11/17/2011 CT of the cervical spine. FINDINGS: CT HEAD FINDINGS Brain: Chronic left PCA infarction, small left cerebellar chronic lacunar infarction, and small left basal ganglia chronic lacunar infarctions are stable. Mild chronic microvascular ischemic changes of white matter and parenchymal volume loss of the brain for age. No acute hemorrhage, large acute infarct, focal  mass effect, effacement of basilar cisterns, extra-axial collection, or hydrocephalus. Vascular: Severe calcific atherosclerosis of carotid siphons and vertebral arteries. No hyperdense vessel identified. Skull: Minimal left frontal scalp soft tissue thickening may represent a contusion. No calvarial fracture. Sinuses/Orbits: No acute finding. Other: None.  CT CERVICAL SPINE FINDINGS Alignment: All moderate cervical levocurvature with apex at C6. Straightening of cervical lordosis. No listhesis. Skull base and vertebrae: No acute fracture. No primary bone lesion or focal pathologic process. Anterior cervical fusion and discectomy from C3-C6. Fusion hardware appears intact and there is no apparent hardware related complication or periprosthetic lucency. The left-sided C3 vertebral body screw extends beyond the vertebral body cortex into the anterolateral soft tissues, unchanged. Soft tissues and spinal canal: No prevertebral fluid or swelling. No visible canal hematoma. Disc levels: Advanced cervical spondylosis with multiple levels of foraminal stenosis greatest at the C6-7 level where it is moderate to severe bilaterally. Mild-to-moderate canal stenosis at the C6-7 level. Upper chest: 3 mm right lung apex a pulmonary nodule (series 7, image 66). Right azygos fissure. Other: 7 mm nodule within the right lobe of the thyroid. Moderate calcific atherosclerosis of the carotid bifurcations. IMPRESSION: CT head: 1. No acute intracranial abnormality identified. 2. Minimal left frontal scalp soft tissue thickening may represent a contusion. No calvarial fracture. 3. Multiple chronic infarcts, chronic microvascular ischemic changes, and brain parenchymal volume loss are stable. CT cervical spine: 1. No acute fracture or dislocation identified. 2. Stable advanced cervical degenerative changes greatest at the C6-7 level. 3. C3-C6 anterior cervical fusion without apparent hardware related complication. Electronically Signed    By: Mitzi Hansen M.D.   On: 12/03/2016 23:58   Ct Cervical Spine Wo Contrast  Result Date: 12/03/2016 CLINICAL DATA:  76 y/o M; Head trauma, minor, GCS>=13, low clinical risk, initial exam; C-spine trauma, low clinical risk. EXAM: CT HEAD WITHOUT CONTRAST CT CERVICAL SPINE WITHOUT CONTRAST TECHNIQUE: Multidetector CT imaging of the head and cervical spine was performed following the standard protocol without intravenous contrast. Multiplanar CT image reconstructions of the cervical spine were also generated. COMPARISON:  10/14/2016 CT of the head and cervical spine. 11/17/2011 CT of the cervical spine. FINDINGS: CT HEAD FINDINGS Brain: Chronic left PCA infarction, small left cerebellar chronic lacunar infarction, and small left basal ganglia chronic lacunar infarctions are stable. Mild chronic microvascular ischemic changes of white matter and parenchymal volume loss of the brain for age. No acute hemorrhage, large acute infarct, focal mass effect, effacement of basilar cisterns, extra-axial collection, or hydrocephalus. Vascular: Severe calcific atherosclerosis of carotid siphons and vertebral arteries. No hyperdense vessel identified. Skull: Minimal left frontal scalp soft tissue thickening may represent a contusion. No calvarial fracture. Sinuses/Orbits: No acute finding. Other: None. CT CERVICAL SPINE FINDINGS Alignment: All moderate cervical levocurvature with apex at C6. Straightening of cervical lordosis. No listhesis. Skull base and vertebrae: No acute fracture. No primary bone lesion or focal pathologic process. Anterior cervical fusion and discectomy from C3-C6. Fusion hardware appears intact and there is no apparent hardware related complication or periprosthetic lucency. The left-sided C3 vertebral body screw extends beyond the vertebral body cortex into the anterolateral soft tissues, unchanged. Soft tissues and spinal canal: No prevertebral fluid or swelling. No visible canal hematoma.  Disc levels: Advanced cervical spondylosis with multiple levels of foraminal stenosis greatest at the C6-7 level where it is moderate to severe bilaterally. Mild-to-moderate canal stenosis at the C6-7 level. Upper chest: 3 mm right lung apex a pulmonary nodule (series 7, image 66). Right azygos fissure. Other: 7 mm nodule within the right lobe of the thyroid. Moderate calcific atherosclerosis of the carotid bifurcations. IMPRESSION: CT head: 1. No acute intracranial abnormality identified. 2. Minimal left frontal scalp soft tissue thickening may represent a contusion. No calvarial fracture. 3. Multiple chronic  infarcts, chronic microvascular ischemic changes, and brain parenchymal volume loss are stable. CT cervical spine: 1. No acute fracture or dislocation identified. 2. Stable advanced cervical degenerative changes greatest at the C6-7 level. 3. C3-C6 anterior cervical fusion without apparent hardware related complication. Electronically Signed   By: Mitzi Hansen M.D.   On: 12/03/2016 23:58    Procedures Procedures (including critical care time)  Medications Ordered in ED Medications - No data to display   Initial Impression / Assessment and Plan / ED Course  I have reviewed the triage vital signs and the nursing notes.  Pertinent labs & imaging results that were available during my care of the patient were reviewed by me and considered in my medical decision making (see chart for details).     Vitals:   12/03/16 2255  BP: (!) 162/69  Pulse: 60  Resp: 16  Temp: 98.7 F (37.1 C)  TempSrc: Oral  SpO2: 100%    Medications - No data to display  [name] is 76 y.o. male presenting with Mechanical fall, physical exam reassuring. There was head trauma however he didn't lose consciousness.   Imaging negative. Patient percent appropriate for discharge back to SNF.  This is a shared visit with the attending physician who personally evaluated the patient and agrees with  the care plan.   Evaluation does not show pathology that would require ongoing emergent intervention or inpatient treatment. Pt is hemodynamically stable and mentating appropriately. Discussed findings and plan with patient/guardian, who agrees with care plan. All questions answered. Return precautions discussed and outpatient follow up given.      Final Clinical Impressions(s) / ED Diagnoses   Final diagnoses:  Fall, initial encounter  Rib contusion, left, initial encounter    New Prescriptions New Prescriptions   No medications on file     Kaylyn Lim 12/04/16 0127    Mancel Bale, MD 12/04/16 [phone]

## 2016-12-03 NOTE — ED Notes (Signed)
Bed: WA21 Expected date:  Expected time:  Means of arrival:  Comments: 76 yo M/ Fall

## 2016-12-03 NOTE — ED Triage Notes (Signed)
Patient was trying to sit in wheelchair and fell. Patient hit his left shoulder. Patient is from [place]. Patient did not his his head.

## 2016-12-03 NOTE — ED Provider Notes (Signed)
  Face-to-face evaluation   History: He describes trying to sit and suddenly fell.  He injured his left arm, and left ribs.  He denies headache, neck pain, focal weakness or paresthesias at this time.  Physical exam: Alert elderly man.  Chest mildly tender left lateral without crepitation or deformity.  Lungs clear anteriorly.  Extremities normal range of motion arms and legs bilaterally.  Had no visualized trauma.  Medical screening examination/treatment/procedure(s) were conducted as a shared visit with non-physician practitioner(s) and myself.  I personally evaluated the patient during the encounter   Mancel Bale, MD 12/04/16 [phone]

## 2016-12-04 DIAGNOSIS — G8911 Acute pain due to trauma: Secondary | ICD-10-CM | POA: Diagnosis not present

## 2016-12-04 NOTE — ED Notes (Signed)
Called report to staff of [facility] and [facility]. Called [facility] for transport.

## 2016-12-04 NOTE — ED Notes (Signed)
No respiratory or acute distress noted resting in bed with eyes closed call light in reach. 

## 2016-12-04 NOTE — ED Notes (Signed)
Pt states he does not have back pain.

## 2016-12-04 NOTE — Discharge Instructions (Signed)
Please follow with your primary care doctor in the next 2 days for a check-up. They must obtain records for further management.  ° °Do not hesitate to return to the Emergency Department for any new, worsening or concerning symptoms.  ° °

## 2016-12-05 DIAGNOSIS — M25561 Pain in right knee: Secondary | ICD-10-CM | POA: Diagnosis not present

## 2016-12-05 DIAGNOSIS — E1122 Type 2 diabetes mellitus with diabetic chronic kidney disease: Secondary | ICD-10-CM | POA: Diagnosis not present

## 2016-12-05 DIAGNOSIS — D649 Anemia, unspecified: Secondary | ICD-10-CM | POA: Diagnosis not present

## 2016-12-05 DIAGNOSIS — M1380 Other specified arthritis, unspecified site: Secondary | ICD-10-CM | POA: Diagnosis not present

## 2016-12-05 DIAGNOSIS — N183 Chronic kidney disease, stage 3 (moderate): Secondary | ICD-10-CM | POA: Diagnosis not present

## 2016-12-05 DIAGNOSIS — I2510 Atherosclerotic heart disease of native coronary artery without angina pectoris: Secondary | ICD-10-CM | POA: Diagnosis not present

## 2016-12-05 DIAGNOSIS — I5022 Chronic systolic (congestive) heart failure: Secondary | ICD-10-CM | POA: Diagnosis not present

## 2016-12-05 DIAGNOSIS — Z95810 Presence of automatic (implantable) cardiac defibrillator: Secondary | ICD-10-CM | POA: Diagnosis not present

## 2016-12-05 DIAGNOSIS — R262 Difficulty in walking, not elsewhere classified: Secondary | ICD-10-CM | POA: Diagnosis not present

## 2016-12-05 DIAGNOSIS — M6281 Muscle weakness (generalized): Secondary | ICD-10-CM | POA: Diagnosis not present

## 2016-12-05 DIAGNOSIS — R2689 Other abnormalities of gait and mobility: Secondary | ICD-10-CM | POA: Diagnosis not present

## 2016-12-06 ENCOUNTER — Encounter: Payer: Medicare Other | Admitting: *Deleted

## 2016-12-06 DIAGNOSIS — N183 Chronic kidney disease, stage 3 (moderate): Secondary | ICD-10-CM | POA: Diagnosis not present

## 2016-12-06 DIAGNOSIS — M25561 Pain in right knee: Secondary | ICD-10-CM | POA: Diagnosis not present

## 2016-12-06 DIAGNOSIS — I2510 Atherosclerotic heart disease of native coronary artery without angina pectoris: Secondary | ICD-10-CM | POA: Diagnosis not present

## 2016-12-06 DIAGNOSIS — Z95810 Presence of automatic (implantable) cardiac defibrillator: Secondary | ICD-10-CM | POA: Diagnosis not present

## 2016-12-06 DIAGNOSIS — E1122 Type 2 diabetes mellitus with diabetic chronic kidney disease: Secondary | ICD-10-CM | POA: Diagnosis not present

## 2016-12-06 DIAGNOSIS — M1380 Other specified arthritis, unspecified site: Secondary | ICD-10-CM | POA: Diagnosis not present

## 2016-12-06 DIAGNOSIS — R262 Difficulty in walking, not elsewhere classified: Secondary | ICD-10-CM | POA: Diagnosis not present

## 2016-12-06 DIAGNOSIS — R2689 Other abnormalities of gait and mobility: Secondary | ICD-10-CM | POA: Diagnosis not present

## 2016-12-06 DIAGNOSIS — D649 Anemia, unspecified: Secondary | ICD-10-CM | POA: Diagnosis not present

## 2016-12-06 DIAGNOSIS — I5022 Chronic systolic (congestive) heart failure: Secondary | ICD-10-CM | POA: Diagnosis not present

## 2016-12-06 DIAGNOSIS — M6281 Muscle weakness (generalized): Secondary | ICD-10-CM | POA: Diagnosis not present

## 2016-12-07 DIAGNOSIS — E1122 Type 2 diabetes mellitus with diabetic chronic kidney disease: Secondary | ICD-10-CM | POA: Diagnosis not present

## 2016-12-07 DIAGNOSIS — R262 Difficulty in walking, not elsewhere classified: Secondary | ICD-10-CM | POA: Diagnosis not present

## 2016-12-07 DIAGNOSIS — M6281 Muscle weakness (generalized): Secondary | ICD-10-CM | POA: Diagnosis not present

## 2016-12-07 DIAGNOSIS — I4892 Unspecified atrial flutter: Secondary | ICD-10-CM | POA: Diagnosis not present

## 2016-12-07 DIAGNOSIS — M79672 Pain in left foot: Secondary | ICD-10-CM | POA: Diagnosis not present

## 2016-12-07 DIAGNOSIS — I5022 Chronic systolic (congestive) heart failure: Secondary | ICD-10-CM | POA: Diagnosis not present

## 2016-12-07 DIAGNOSIS — W19XXXD Unspecified fall, subsequent encounter: Secondary | ICD-10-CM | POA: Diagnosis not present

## 2016-12-07 DIAGNOSIS — Z95810 Presence of automatic (implantable) cardiac defibrillator: Secondary | ICD-10-CM | POA: Diagnosis not present

## 2016-12-07 DIAGNOSIS — M25561 Pain in right knee: Secondary | ICD-10-CM | POA: Diagnosis not present

## 2016-12-07 DIAGNOSIS — M1380 Other specified arthritis, unspecified site: Secondary | ICD-10-CM | POA: Diagnosis not present

## 2016-12-07 DIAGNOSIS — I10 Essential (primary) hypertension: Secondary | ICD-10-CM | POA: Diagnosis not present

## 2016-12-07 DIAGNOSIS — D649 Anemia, unspecified: Secondary | ICD-10-CM | POA: Diagnosis not present

## 2016-12-07 DIAGNOSIS — N183 Chronic kidney disease, stage 3 (moderate): Secondary | ICD-10-CM | POA: Diagnosis not present

## 2016-12-07 DIAGNOSIS — I2510 Atherosclerotic heart disease of native coronary artery without angina pectoris: Secondary | ICD-10-CM | POA: Diagnosis not present

## 2016-12-07 DIAGNOSIS — M25572 Pain in left ankle and joints of left foot: Secondary | ICD-10-CM | POA: Diagnosis not present

## 2016-12-07 DIAGNOSIS — R2689 Other abnormalities of gait and mobility: Secondary | ICD-10-CM | POA: Diagnosis not present

## 2016-12-08 DIAGNOSIS — Z95810 Presence of automatic (implantable) cardiac defibrillator: Secondary | ICD-10-CM | POA: Diagnosis not present

## 2016-12-08 DIAGNOSIS — I5022 Chronic systolic (congestive) heart failure: Secondary | ICD-10-CM | POA: Diagnosis not present

## 2016-12-08 DIAGNOSIS — I639 Cerebral infarction, unspecified: Secondary | ICD-10-CM | POA: Diagnosis not present

## 2016-12-08 DIAGNOSIS — D649 Anemia, unspecified: Secondary | ICD-10-CM | POA: Diagnosis not present

## 2016-12-08 DIAGNOSIS — N183 Chronic kidney disease, stage 3 (moderate): Secondary | ICD-10-CM | POA: Diagnosis not present

## 2016-12-08 DIAGNOSIS — M6281 Muscle weakness (generalized): Secondary | ICD-10-CM | POA: Diagnosis not present

## 2016-12-08 DIAGNOSIS — M1380 Other specified arthritis, unspecified site: Secondary | ICD-10-CM | POA: Diagnosis not present

## 2016-12-08 DIAGNOSIS — M25561 Pain in right knee: Secondary | ICD-10-CM | POA: Diagnosis not present

## 2016-12-08 DIAGNOSIS — E1122 Type 2 diabetes mellitus with diabetic chronic kidney disease: Secondary | ICD-10-CM | POA: Diagnosis not present

## 2016-12-08 DIAGNOSIS — S92415D Nondisplaced fracture of proximal phalanx of left great toe, subsequent encounter for fracture with routine healing: Secondary | ICD-10-CM | POA: Diagnosis not present

## 2016-12-08 DIAGNOSIS — I2510 Atherosclerotic heart disease of native coronary artery without angina pectoris: Secondary | ICD-10-CM | POA: Diagnosis not present

## 2016-12-08 DIAGNOSIS — R262 Difficulty in walking, not elsewhere classified: Secondary | ICD-10-CM | POA: Diagnosis not present

## 2016-12-08 DIAGNOSIS — R2689 Other abnormalities of gait and mobility: Secondary | ICD-10-CM | POA: Diagnosis not present

## 2016-12-09 DIAGNOSIS — I5022 Chronic systolic (congestive) heart failure: Secondary | ICD-10-CM | POA: Diagnosis not present

## 2016-12-09 DIAGNOSIS — M1380 Other specified arthritis, unspecified site: Secondary | ICD-10-CM | POA: Diagnosis not present

## 2016-12-09 DIAGNOSIS — M6281 Muscle weakness (generalized): Secondary | ICD-10-CM | POA: Diagnosis not present

## 2016-12-09 DIAGNOSIS — I2510 Atherosclerotic heart disease of native coronary artery without angina pectoris: Secondary | ICD-10-CM | POA: Diagnosis not present

## 2016-12-09 DIAGNOSIS — D649 Anemia, unspecified: Secondary | ICD-10-CM | POA: Diagnosis not present

## 2016-12-09 DIAGNOSIS — M25561 Pain in right knee: Secondary | ICD-10-CM | POA: Diagnosis not present

## 2016-12-09 DIAGNOSIS — R2689 Other abnormalities of gait and mobility: Secondary | ICD-10-CM | POA: Diagnosis not present

## 2016-12-09 DIAGNOSIS — R262 Difficulty in walking, not elsewhere classified: Secondary | ICD-10-CM | POA: Diagnosis not present

## 2016-12-09 DIAGNOSIS — G8929 Other chronic pain: Secondary | ICD-10-CM | POA: Diagnosis not present

## 2016-12-09 DIAGNOSIS — E0822 Diabetes mellitus due to underlying condition with diabetic chronic kidney disease: Secondary | ICD-10-CM | POA: Diagnosis not present

## 2016-12-09 DIAGNOSIS — Z95810 Presence of automatic (implantable) cardiac defibrillator: Secondary | ICD-10-CM | POA: Diagnosis not present

## 2016-12-09 DIAGNOSIS — I509 Heart failure, unspecified: Secondary | ICD-10-CM | POA: Diagnosis not present

## 2016-12-09 DIAGNOSIS — E1122 Type 2 diabetes mellitus with diabetic chronic kidney disease: Secondary | ICD-10-CM | POA: Diagnosis not present

## 2016-12-09 DIAGNOSIS — N183 Chronic kidney disease, stage 3 (moderate): Secondary | ICD-10-CM | POA: Diagnosis not present

## 2016-12-12 DIAGNOSIS — M6281 Muscle weakness (generalized): Secondary | ICD-10-CM | POA: Diagnosis not present

## 2016-12-12 DIAGNOSIS — I5022 Chronic systolic (congestive) heart failure: Secondary | ICD-10-CM | POA: Diagnosis not present

## 2016-12-12 DIAGNOSIS — R2689 Other abnormalities of gait and mobility: Secondary | ICD-10-CM | POA: Diagnosis not present

## 2016-12-12 DIAGNOSIS — E1122 Type 2 diabetes mellitus with diabetic chronic kidney disease: Secondary | ICD-10-CM | POA: Diagnosis not present

## 2016-12-12 DIAGNOSIS — Z95810 Presence of automatic (implantable) cardiac defibrillator: Secondary | ICD-10-CM | POA: Diagnosis not present

## 2016-12-12 DIAGNOSIS — M1380 Other specified arthritis, unspecified site: Secondary | ICD-10-CM | POA: Diagnosis not present

## 2016-12-12 DIAGNOSIS — D649 Anemia, unspecified: Secondary | ICD-10-CM | POA: Diagnosis not present

## 2016-12-12 DIAGNOSIS — I2510 Atherosclerotic heart disease of native coronary artery without angina pectoris: Secondary | ICD-10-CM | POA: Diagnosis not present

## 2016-12-12 DIAGNOSIS — R262 Difficulty in walking, not elsewhere classified: Secondary | ICD-10-CM | POA: Diagnosis not present

## 2016-12-12 DIAGNOSIS — M25561 Pain in right knee: Secondary | ICD-10-CM | POA: Diagnosis not present

## 2016-12-12 DIAGNOSIS — N183 Chronic kidney disease, stage 3 (moderate): Secondary | ICD-10-CM | POA: Diagnosis not present

## 2016-12-13 ENCOUNTER — Ambulatory Visit (INDEPENDENT_AMBULATORY_CARE_PROVIDER_SITE_OTHER): Payer: Medicare Other

## 2016-12-13 ENCOUNTER — Encounter (INDEPENDENT_AMBULATORY_CARE_PROVIDER_SITE_OTHER): Payer: Self-pay | Admitting: Orthopaedic Surgery

## 2016-12-13 ENCOUNTER — Ambulatory Visit (INDEPENDENT_AMBULATORY_CARE_PROVIDER_SITE_OTHER): Payer: Medicare Other | Admitting: Orthopaedic Surgery

## 2016-12-13 DIAGNOSIS — M79672 Pain in left foot: Secondary | ICD-10-CM

## 2016-12-13 NOTE — Progress Notes (Signed)
Office Visit Note   Patient: [name]           Date of Birth: 01/29/1941           MRN: [id number] Visit Date: 12/13/2016              Requested by: Mirna Mires, MD [address] [place], [place] [address] PCP: Mirna Mires, MD   Assessment & Plan: Visit Diagnoses:  1. Pain in left foot     Plan: Patient has metatarsalgia from toe amputations. Recommend orthotic possibly a metatarsal bar to offload the metatarsal heads. Prescription to biotech. Follow-up as needed.  Follow-Up Instructions: Return if symptoms worsen or fail to improve.   Orders:  Orders Placed This Encounter  Procedures  . XR Foot Complete Left   No orders of the defined types were placed in this encounter.     Procedures: No procedures performed   Clinical Data: No additional findings.   Subjective: Chief Complaint  Patient presents with  . Left Foot - Pain    Patient 76 year old gentleman comes in with a left foot pain under the third and fourth metatarsal heads. Denies any injuries. He lives at a nursing home permanently. This is sore to touch. Denies any open wounds.    Review of Systems  Constitutional: Negative.   All other systems reviewed and are negative.    Objective: Vital Signs: There were no vitals taken for this visit.  Physical Exam  Constitutional: He is oriented to person, place, and time. He appears well-developed and well-nourished.  HENT:  Head: Normocephalic and atraumatic.  Eyes: Pupils are equal, round, and reactive to light.  Neck: Neck supple.  Pulmonary/Chest: Effort normal.  Abdominal: Soft.  Musculoskeletal: Normal range of motion.  Neurological: He is alert and oriented to person, place, and time.  Skin: Skin is warm.  Psychiatric: He has a normal mood and affect. His behavior is normal. Judgment and thought content normal.  Nursing note and vitals reviewed.   Ortho Exam Left foot exam shows status post third and fourth toe  amputations. No open wounds. No signs of swelling or infection or cellulitis. He has tenderness under the third and fourth metatarsals. Specialty Comments:  No specialty comments available.  Imaging: Xr Foot Complete Left  Result Date: 12/13/2016 Diffuse osteopenia with degenerative changes. Status post third and fourth toe amputation    PMFS History: Patient Active Problem List   Diagnosis Date Noted  . Dementia with behavioral disturbance 03/29/2016  . CKD (chronic kidney disease) stage 3, GFR 30-59 ml/min 03/28/2016  . Diabetes (HCC) 03/27/2016  . AKI (acute kidney injury) (HCC) 03/27/2016  . Adjustment disorder with depressed mood 12/25/2015  . Memory deficits 12/01/2015  . Encephalopathy, metabolic 11/28/2015  . Acute left PCA stroke (HCC) 11/25/2015  . Wound dehiscence 03/03/2015  . Lumbar stenosis with neurogenic claudication 02/02/2015  . Cervical spondylosis with myelopathy 04/17/2014  . ICD (implantable cardioverter-defibrillator), biventricular, in situ 11/19/2013  . Cardiac resynchronization therapy defibrillator (CRT-D) in place 07/03/2013  . Chronic renal failure 01/24/2013  . CAD (coronary artery disease) 12/10/2012  . Chronic systolic heart failure (HCC) 11/20/2012  . Cardiomyopathy, ischemic 11/20/2012  . Atrial flutter (HCC) 11/08/2012  . Dilated cardiomyopathy (HCC) 11/01/2012  . Cervical stenosis of spine 10/28/2011  . Spondylosis of cervical joint 10/28/2011  . Cervical myelopathy (HCC) 10/28/2011  . Quadriparesis (HCC) 10/28/2011  . DVT of right proximal brachial through the distal axillary vein, acute 10/06/2011  .  Constipation due to pain medication 10/06/2011  . Venous insufficiency 10/05/2011  . 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 Hyperlipidemia 10/05/2011  . Normocytic anemia 10/03/2011  . Falls frequently 10/03/2011  . Diabetes mellitus with chronic kidney disease  (HCC) 10/03/2011  . HTN (hypertension) 10/03/2011  . Gait abnormality secondary to lumbar spinal stenosis 10/03/2011  . Arthritis 10/03/2011   Past Medical History:  Diagnosis Date  . Acute encephalopathy   . AICD (automatic cardioverter/defibrillator) present   . Anemia   . Arthritis    "all over" (05/16/2013)  . Atrial flutter (HCC)   . CAD (coronary artery disease)   . Cardiomyopathy (HCC)    a. 10/2012 Echo: EF 20-25%, diff HK, mod dil LA/RV/RA.  Marland Kitchen Cervical myelopathy (HCC)   . Cervical spinal stenosis   . Chronic systolic congestive heart failure (HCC)   . CKD (chronic kidney disease), stage III   . 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 CVA (cerebral vascular accident) (HCC)   . Decreased vision of right eye   . Dementia   . Depression    "son died in service in 1990-08-09; still bothers me" (05/16/2013)  . DVT (deep venous thrombosis) (HCC) 10/2011   RUE; pt denies this hx on 05/16/2013  . Dysrhythmia   . Frequent falls   . GERD (gastroesophageal reflux disease)   . H/O hiatal hernia   . Hyperlipidemia   . Hypertension   . Hypoglycemia   . Lumbar spinal stenosis   . Mononeuritis of unspecified site   . Myocardial infarction (HCC)   . Pneumonia 1960   , also 2014  . Quadriparesis (HCC) 08-09-11   pre op  . Shortness of breath dyspnea    with exertion  . Type II diabetes mellitus (HCC)    type 2  . Venous insufficiency     Family History  Problem Relation Age of Onset  . Diabetes Brother   . Cancer Father        Lung cancer  . Heart attack Mother        pt thinks she had MI  . Cancer Mother     Past Surgical History:  Procedure Laterality Date  . ANTERIOR CERVICAL DECOMP/DISCECTOMY FUSION  10/31/2011   Procedure: ANTERIOR CERVICAL DECOMPRESSION/DISCECTOMY FUSION 2 LEVELS;  Surgeon: Cristi Loron, MD;  Location: [facility];  Service: Neurosurgery;  Laterality: N/A;  Cervical  Four-Five Cervical Five-Six Anterior Cervical Decompression with fusion interbody prothesis plating and bonegraft  . ANTERIOR CERVICAL DECOMP/DISCECTOMY FUSION N/A 04/17/2014   Procedure: CERVICAL THREE TO FOUR ANTERIOR CERVICAL DECOMPRESSION/DISCECTOMY FUSION 1 LEVEL/HARDWARE REMOVAL;  Surgeon: Tressie Stalker, MD;  Location: [facility];  Service: Neurosurgery;  Laterality: N/A;  C34 anterior cervical decompression with fusion interbody prosthesis plating and bonegraft with exploration of prev fusion and possible removal of old hardware  . BI-VENTRICULAR IMPLANTABLE CARDIOVERTER DEFIBRILLATOR N/A 05/16/2013   Procedure: BI-VENTRICULAR IMPLANTABLE CARDIOVERTER DEFIBRILLATOR  (CRT-D);  Surgeon: Marinus Maw, MD;  Location: [facility] CATH LAB;  Service: Cardiovascular;  Laterality: N/A;  . BI-VENTRICULAR IMPLANTABLE CARDIOVERTER DEFIBRILLATOR  (CRT-D) Left 05/16/2013   STJ CRTD implanted by Dr Ladona Ridgel for primary prevention/CHF  . CARDIAC CATHETERIZATION  10/2012  . CARPAL TUNNEL RELEASE Right ~ 08-09-2011  . CATARACT EXTRACTION W/ INTRAOCULAR LENS IMPLANT Bilateral   . JOINT REPLACEMENT  08/09/2011  . LEFT AND  RIGHT HEART CATHETERIZATION WITH CORONARY ANGIOGRAM N/A 11/06/2012   Procedure: LEFT AND RIGHT HEART CATHETERIZATION WITH CORONARY ANGIOGRAM;  Surgeon: Laurey Morale, MD;  Location: [facility] CATH LAB;  Service: Cardiovascular;  Laterality: N/A;  . LUMBAR LAMINECTOMY/DECOMPRESSION MICRODISCECTOMY N/A 02/02/2015   Procedure: LUMBAR LAMINECTOMY/DECOMPRESSION MICRODISCECTOMY 2 LEVELS;  Surgeon: Tressie Stalker, MD;  Location: [facility];  Service: Neurosurgery;  Laterality: N/A;  L34 L45 laminectomy and foraminotomy  . LUMBAR WOUND DEBRIDEMENT N/A 03/04/2015   Procedure: Incision and Drainage of LUMBAR WOUND ;  Surgeon: Tressie Stalker, MD;  Location: [facility];  Service: Neurosurgery;  Laterality: N/A;  . 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 toe removal  Left    3rd and 4th toe removed  .  TOTAL HIP ARTHROPLASTY Right ~ 2012   Social History   Occupational History  . Not on file.   Social History Main Topics  . Smoking status: Never Smoker  . Smokeless tobacco: Never Used  . Alcohol use No     Comment: 05/16/2013 "used to drink a little bit a long time ago; nothing in over 30 yrs"  . Drug use: No  . Sexual activity: Not Currently

## 2016-12-15 ENCOUNTER — Telehealth: Payer: Self-pay | Admitting: Cardiology

## 2016-12-15 NOTE — Telephone Encounter (Signed)
Spoke w/ pt nurse and requested that patient send a manual transmission b/c his home monitor has not updated in at least 7 days.

## 2016-12-21 DIAGNOSIS — R531 Weakness: Secondary | ICD-10-CM | POA: Diagnosis not present

## 2016-12-21 DIAGNOSIS — I639 Cerebral infarction, unspecified: Secondary | ICD-10-CM | POA: Diagnosis not present

## 2016-12-21 DIAGNOSIS — I5022 Chronic systolic (congestive) heart failure: Secondary | ICD-10-CM | POA: Diagnosis not present

## 2016-12-21 DIAGNOSIS — W19XXXD Unspecified fall, subsequent encounter: Secondary | ICD-10-CM | POA: Diagnosis not present

## 2016-12-27 DIAGNOSIS — M542 Cervicalgia: Secondary | ICD-10-CM | POA: Diagnosis not present

## 2017-02-01 ENCOUNTER — Ambulatory Visit (INDEPENDENT_AMBULATORY_CARE_PROVIDER_SITE_OTHER): Payer: Medicare Other | Admitting: *Deleted

## 2017-02-01 ENCOUNTER — Telehealth: Payer: Self-pay | Admitting: *Deleted

## 2017-02-01 DIAGNOSIS — D649 Anemia, unspecified: Secondary | ICD-10-CM | POA: Diagnosis not present

## 2017-02-01 DIAGNOSIS — R2689 Other abnormalities of gait and mobility: Secondary | ICD-10-CM | POA: Diagnosis not present

## 2017-02-01 DIAGNOSIS — I255 Ischemic cardiomyopathy: Secondary | ICD-10-CM

## 2017-02-01 DIAGNOSIS — N183 Chronic kidney disease, stage 3 (moderate): Secondary | ICD-10-CM | POA: Diagnosis not present

## 2017-02-01 DIAGNOSIS — R1312 Dysphagia, oropharyngeal phase: Secondary | ICD-10-CM | POA: Diagnosis not present

## 2017-02-01 DIAGNOSIS — I2510 Atherosclerotic heart disease of native coronary artery without angina pectoris: Secondary | ICD-10-CM | POA: Diagnosis not present

## 2017-02-01 DIAGNOSIS — R262 Difficulty in walking, not elsewhere classified: Secondary | ICD-10-CM | POA: Diagnosis not present

## 2017-02-01 DIAGNOSIS — Z95810 Presence of automatic (implantable) cardiac defibrillator: Secondary | ICD-10-CM | POA: Diagnosis not present

## 2017-02-01 DIAGNOSIS — M1380 Other specified arthritis, unspecified site: Secondary | ICD-10-CM | POA: Diagnosis not present

## 2017-02-01 DIAGNOSIS — M6281 Muscle weakness (generalized): Secondary | ICD-10-CM | POA: Diagnosis not present

## 2017-02-01 DIAGNOSIS — M25561 Pain in right knee: Secondary | ICD-10-CM | POA: Diagnosis not present

## 2017-02-01 DIAGNOSIS — E1122 Type 2 diabetes mellitus with diabetic chronic kidney disease: Secondary | ICD-10-CM | POA: Diagnosis not present

## 2017-02-01 DIAGNOSIS — I5022 Chronic systolic (congestive) heart failure: Secondary | ICD-10-CM | POA: Diagnosis not present

## 2017-02-01 NOTE — Telephone Encounter (Signed)
Nurse at [facility] checking on compatibility to do remote monitoring. I let them know his device is compatible, but it looks like we've had trouble d/t low cell signal and no landline in patient's room. Confirmed that he does need to come to appt today with [facility]. Option to connect monitor through a landline somewhere in facility to send manual transmission every three months. They will work on Programmer, multimedia.

## 2017-02-02 DIAGNOSIS — N183 Chronic kidney disease, stage 3 (moderate): Secondary | ICD-10-CM | POA: Diagnosis not present

## 2017-02-02 DIAGNOSIS — M25561 Pain in right knee: Secondary | ICD-10-CM | POA: Diagnosis not present

## 2017-02-02 DIAGNOSIS — I5022 Chronic systolic (congestive) heart failure: Secondary | ICD-10-CM | POA: Diagnosis not present

## 2017-02-02 DIAGNOSIS — I2510 Atherosclerotic heart disease of native coronary artery without angina pectoris: Secondary | ICD-10-CM | POA: Diagnosis not present

## 2017-02-02 DIAGNOSIS — Z95810 Presence of automatic (implantable) cardiac defibrillator: Secondary | ICD-10-CM | POA: Diagnosis not present

## 2017-02-02 DIAGNOSIS — R2689 Other abnormalities of gait and mobility: Secondary | ICD-10-CM | POA: Diagnosis not present

## 2017-02-02 DIAGNOSIS — E1122 Type 2 diabetes mellitus with diabetic chronic kidney disease: Secondary | ICD-10-CM | POA: Diagnosis not present

## 2017-02-02 DIAGNOSIS — M1380 Other specified arthritis, unspecified site: Secondary | ICD-10-CM | POA: Diagnosis not present

## 2017-02-02 DIAGNOSIS — M6281 Muscle weakness (generalized): Secondary | ICD-10-CM | POA: Diagnosis not present

## 2017-02-02 DIAGNOSIS — R262 Difficulty in walking, not elsewhere classified: Secondary | ICD-10-CM | POA: Diagnosis not present

## 2017-02-02 DIAGNOSIS — R1312 Dysphagia, oropharyngeal phase: Secondary | ICD-10-CM | POA: Diagnosis not present

## 2017-02-02 DIAGNOSIS — D649 Anemia, unspecified: Secondary | ICD-10-CM | POA: Diagnosis not present

## 2017-02-02 LAB — CUP PACEART INCLINIC DEVICE CHECK
Brady Statistic RV Percent Paced: 98 %
Date Time Interrogation Session: 20181031194958
HIGH POWER IMPEDANCE MEASURED VALUE: 43.875
Implantable Lead Implant Date: 20150212
Implantable Lead Implant Date: 20150212
Implantable Lead Implant Date: 20150212
Implantable Lead Location: 753858
Implantable Lead Location: 753859
Implantable Pulse Generator Implant Date: 20150212
Lead Channel Impedance Value: 387.5 Ohm
Lead Channel Impedance Value: 525 Ohm
Lead Channel Pacing Threshold Amplitude: 1.25 V
Lead Channel Pacing Threshold Amplitude: 1.25 V
Lead Channel Pacing Threshold Amplitude: 1.5 V
Lead Channel Pacing Threshold Amplitude: 1.75 V
Lead Channel Pacing Threshold Pulse Width: 0.5 ms
Lead Channel Pacing Threshold Pulse Width: 0.5 ms
Lead Channel Pacing Threshold Pulse Width: 0.8 ms
Lead Channel Pacing Threshold Pulse Width: 0.8 ms
Lead Channel Sensing Intrinsic Amplitude: 11.9 mV
Lead Channel Setting Pacing Amplitude: 2.25 V
Lead Channel Setting Pacing Amplitude: 2.5 V
Lead Channel Setting Pacing Pulse Width: 0.5 ms
Lead Channel Setting Pacing Pulse Width: 0.8 ms
Lead Channel Setting Sensing Sensitivity: 0.5 mV
MDC IDC LEAD LOCATION: 753860
MDC IDC MSMT BATTERY REMAINING LONGEVITY: 27 mo
MDC IDC MSMT LEADCHNL LV PACING THRESHOLD AMPLITUDE: 1.75 V
MDC IDC MSMT LEADCHNL RA SENSING INTR AMPL: 1.8 mV
MDC IDC MSMT LEADCHNL RV IMPEDANCE VALUE: 400 Ohm
MDC IDC MSMT LEADCHNL RV PACING THRESHOLD AMPLITUDE: 1.5 V
MDC IDC MSMT LEADCHNL RV PACING THRESHOLD PULSEWIDTH: 0.5 ms
MDC IDC MSMT LEADCHNL RV PACING THRESHOLD PULSEWIDTH: 0.5 ms
MDC IDC SET LEADCHNL LV PACING AMPLITUDE: 2.75 V
MDC IDC STAT BRADY RA PERCENT PACED: 82 %
Pulse Gen Serial Number: 7170834

## 2017-02-02 NOTE — Progress Notes (Signed)
CRT-D device check in office. Thresholds and sensing consistent with previous device measurements. Lead impedance trends stable over time. No mode switch episodes recorded. No ventricular arrhythmia episodes recorded. Patient bi-ventricularly pacing 58% of the time. Device programmed with appropriate safety margins. Heart failure diagnostics reviewed and trends are stable for patient. vibratory alerts demonstrated for patient. No changes made this session. Estimated longevity 2.3 years. In notes back to [facility] rehab documented that pt has a device that has a battery recall and stressed importance of remote monitoring for battery performance. Home monitor instructions given as well as landline phone d/t issues with cellular service in facility. Device clinic apt. scheduled for 05/04/2017. Battery performance upgrade completed.

## 2017-02-03 DIAGNOSIS — M6281 Muscle weakness (generalized): Secondary | ICD-10-CM | POA: Diagnosis not present

## 2017-02-03 DIAGNOSIS — E1122 Type 2 diabetes mellitus with diabetic chronic kidney disease: Secondary | ICD-10-CM | POA: Diagnosis not present

## 2017-02-03 DIAGNOSIS — R2689 Other abnormalities of gait and mobility: Secondary | ICD-10-CM | POA: Diagnosis not present

## 2017-02-03 DIAGNOSIS — I2510 Atherosclerotic heart disease of native coronary artery without angina pectoris: Secondary | ICD-10-CM | POA: Diagnosis not present

## 2017-02-03 DIAGNOSIS — M1380 Other specified arthritis, unspecified site: Secondary | ICD-10-CM | POA: Diagnosis not present

## 2017-02-03 DIAGNOSIS — M25561 Pain in right knee: Secondary | ICD-10-CM | POA: Diagnosis not present

## 2017-02-03 DIAGNOSIS — Z95810 Presence of automatic (implantable) cardiac defibrillator: Secondary | ICD-10-CM | POA: Diagnosis not present

## 2017-02-03 DIAGNOSIS — R262 Difficulty in walking, not elsewhere classified: Secondary | ICD-10-CM | POA: Diagnosis not present

## 2017-02-03 DIAGNOSIS — I5022 Chronic systolic (congestive) heart failure: Secondary | ICD-10-CM | POA: Diagnosis not present

## 2017-02-03 DIAGNOSIS — R1312 Dysphagia, oropharyngeal phase: Secondary | ICD-10-CM | POA: Diagnosis not present

## 2017-02-03 DIAGNOSIS — N183 Chronic kidney disease, stage 3 (moderate): Secondary | ICD-10-CM | POA: Diagnosis not present

## 2017-02-03 DIAGNOSIS — D649 Anemia, unspecified: Secondary | ICD-10-CM | POA: Diagnosis not present

## 2017-02-06 DIAGNOSIS — Z95810 Presence of automatic (implantable) cardiac defibrillator: Secondary | ICD-10-CM | POA: Diagnosis not present

## 2017-02-06 DIAGNOSIS — M1380 Other specified arthritis, unspecified site: Secondary | ICD-10-CM | POA: Diagnosis not present

## 2017-02-06 DIAGNOSIS — I5022 Chronic systolic (congestive) heart failure: Secondary | ICD-10-CM | POA: Diagnosis not present

## 2017-02-06 DIAGNOSIS — R2689 Other abnormalities of gait and mobility: Secondary | ICD-10-CM | POA: Diagnosis not present

## 2017-02-06 DIAGNOSIS — R262 Difficulty in walking, not elsewhere classified: Secondary | ICD-10-CM | POA: Diagnosis not present

## 2017-02-06 DIAGNOSIS — R1312 Dysphagia, oropharyngeal phase: Secondary | ICD-10-CM | POA: Diagnosis not present

## 2017-02-06 DIAGNOSIS — E1122 Type 2 diabetes mellitus with diabetic chronic kidney disease: Secondary | ICD-10-CM | POA: Diagnosis not present

## 2017-02-06 DIAGNOSIS — D649 Anemia, unspecified: Secondary | ICD-10-CM | POA: Diagnosis not present

## 2017-02-06 DIAGNOSIS — N183 Chronic kidney disease, stage 3 (moderate): Secondary | ICD-10-CM | POA: Diagnosis not present

## 2017-02-06 DIAGNOSIS — M25561 Pain in right knee: Secondary | ICD-10-CM | POA: Diagnosis not present

## 2017-02-06 DIAGNOSIS — I2510 Atherosclerotic heart disease of native coronary artery without angina pectoris: Secondary | ICD-10-CM | POA: Diagnosis not present

## 2017-02-06 DIAGNOSIS — M6281 Muscle weakness (generalized): Secondary | ICD-10-CM | POA: Diagnosis not present

## 2017-02-07 DIAGNOSIS — N183 Chronic kidney disease, stage 3 (moderate): Secondary | ICD-10-CM | POA: Diagnosis not present

## 2017-02-07 DIAGNOSIS — M6281 Muscle weakness (generalized): Secondary | ICD-10-CM | POA: Diagnosis not present

## 2017-02-07 DIAGNOSIS — Z95810 Presence of automatic (implantable) cardiac defibrillator: Secondary | ICD-10-CM | POA: Diagnosis not present

## 2017-02-07 DIAGNOSIS — E1122 Type 2 diabetes mellitus with diabetic chronic kidney disease: Secondary | ICD-10-CM | POA: Diagnosis not present

## 2017-02-07 DIAGNOSIS — R262 Difficulty in walking, not elsewhere classified: Secondary | ICD-10-CM | POA: Diagnosis not present

## 2017-02-07 DIAGNOSIS — I5022 Chronic systolic (congestive) heart failure: Secondary | ICD-10-CM | POA: Diagnosis not present

## 2017-02-07 DIAGNOSIS — D649 Anemia, unspecified: Secondary | ICD-10-CM | POA: Diagnosis not present

## 2017-02-07 DIAGNOSIS — I2510 Atherosclerotic heart disease of native coronary artery without angina pectoris: Secondary | ICD-10-CM | POA: Diagnosis not present

## 2017-02-07 DIAGNOSIS — R2689 Other abnormalities of gait and mobility: Secondary | ICD-10-CM | POA: Diagnosis not present

## 2017-02-07 DIAGNOSIS — M1380 Other specified arthritis, unspecified site: Secondary | ICD-10-CM | POA: Diagnosis not present

## 2017-02-07 DIAGNOSIS — R1312 Dysphagia, oropharyngeal phase: Secondary | ICD-10-CM | POA: Diagnosis not present

## 2017-02-07 DIAGNOSIS — M25561 Pain in right knee: Secondary | ICD-10-CM | POA: Diagnosis not present

## 2017-02-08 DIAGNOSIS — M6281 Muscle weakness (generalized): Secondary | ICD-10-CM | POA: Diagnosis not present

## 2017-02-08 DIAGNOSIS — I5022 Chronic systolic (congestive) heart failure: Secondary | ICD-10-CM | POA: Diagnosis not present

## 2017-02-08 DIAGNOSIS — I2510 Atherosclerotic heart disease of native coronary artery without angina pectoris: Secondary | ICD-10-CM | POA: Diagnosis not present

## 2017-02-08 DIAGNOSIS — D649 Anemia, unspecified: Secondary | ICD-10-CM | POA: Diagnosis not present

## 2017-02-08 DIAGNOSIS — Z95810 Presence of automatic (implantable) cardiac defibrillator: Secondary | ICD-10-CM | POA: Diagnosis not present

## 2017-02-08 DIAGNOSIS — R2689 Other abnormalities of gait and mobility: Secondary | ICD-10-CM | POA: Diagnosis not present

## 2017-02-08 DIAGNOSIS — R1312 Dysphagia, oropharyngeal phase: Secondary | ICD-10-CM | POA: Diagnosis not present

## 2017-02-08 DIAGNOSIS — N183 Chronic kidney disease, stage 3 (moderate): Secondary | ICD-10-CM | POA: Diagnosis not present

## 2017-02-08 DIAGNOSIS — M25561 Pain in right knee: Secondary | ICD-10-CM | POA: Diagnosis not present

## 2017-02-08 DIAGNOSIS — R262 Difficulty in walking, not elsewhere classified: Secondary | ICD-10-CM | POA: Diagnosis not present

## 2017-02-08 DIAGNOSIS — E1122 Type 2 diabetes mellitus with diabetic chronic kidney disease: Secondary | ICD-10-CM | POA: Diagnosis not present

## 2017-02-08 DIAGNOSIS — M1380 Other specified arthritis, unspecified site: Secondary | ICD-10-CM | POA: Diagnosis not present

## 2017-02-10 DIAGNOSIS — N183 Chronic kidney disease, stage 3 (moderate): Secondary | ICD-10-CM | POA: Diagnosis not present

## 2017-02-10 DIAGNOSIS — Z95810 Presence of automatic (implantable) cardiac defibrillator: Secondary | ICD-10-CM | POA: Diagnosis not present

## 2017-02-10 DIAGNOSIS — I509 Heart failure, unspecified: Secondary | ICD-10-CM | POA: Diagnosis not present

## 2017-02-10 DIAGNOSIS — I2510 Atherosclerotic heart disease of native coronary artery without angina pectoris: Secondary | ICD-10-CM | POA: Diagnosis not present

## 2017-02-17 DIAGNOSIS — E119 Type 2 diabetes mellitus without complications: Secondary | ICD-10-CM | POA: Diagnosis not present

## 2017-03-01 DIAGNOSIS — M6281 Muscle weakness (generalized): Secondary | ICD-10-CM | POA: Diagnosis not present

## 2017-03-01 DIAGNOSIS — I5022 Chronic systolic (congestive) heart failure: Secondary | ICD-10-CM | POA: Diagnosis not present

## 2017-03-01 DIAGNOSIS — I2510 Atherosclerotic heart disease of native coronary artery without angina pectoris: Secondary | ICD-10-CM | POA: Diagnosis not present

## 2017-03-01 DIAGNOSIS — R2689 Other abnormalities of gait and mobility: Secondary | ICD-10-CM | POA: Diagnosis not present

## 2017-03-01 DIAGNOSIS — M1380 Other specified arthritis, unspecified site: Secondary | ICD-10-CM | POA: Diagnosis not present

## 2017-03-01 DIAGNOSIS — R262 Difficulty in walking, not elsewhere classified: Secondary | ICD-10-CM | POA: Diagnosis not present

## 2017-03-01 DIAGNOSIS — R1312 Dysphagia, oropharyngeal phase: Secondary | ICD-10-CM | POA: Diagnosis not present

## 2017-03-01 DIAGNOSIS — D649 Anemia, unspecified: Secondary | ICD-10-CM | POA: Diagnosis not present

## 2017-03-01 DIAGNOSIS — N183 Chronic kidney disease, stage 3 (moderate): Secondary | ICD-10-CM | POA: Diagnosis not present

## 2017-03-01 DIAGNOSIS — M25561 Pain in right knee: Secondary | ICD-10-CM | POA: Diagnosis not present

## 2017-03-01 DIAGNOSIS — Z95810 Presence of automatic (implantable) cardiac defibrillator: Secondary | ICD-10-CM | POA: Diagnosis not present

## 2017-03-01 DIAGNOSIS — E1122 Type 2 diabetes mellitus with diabetic chronic kidney disease: Secondary | ICD-10-CM | POA: Diagnosis not present

## 2017-03-05 DIAGNOSIS — R262 Difficulty in walking, not elsewhere classified: Secondary | ICD-10-CM | POA: Diagnosis not present

## 2017-03-05 DIAGNOSIS — R1312 Dysphagia, oropharyngeal phase: Secondary | ICD-10-CM | POA: Diagnosis not present

## 2017-03-05 DIAGNOSIS — I2510 Atherosclerotic heart disease of native coronary artery without angina pectoris: Secondary | ICD-10-CM | POA: Diagnosis not present

## 2017-03-05 DIAGNOSIS — M1380 Other specified arthritis, unspecified site: Secondary | ICD-10-CM | POA: Diagnosis not present

## 2017-03-05 DIAGNOSIS — D649 Anemia, unspecified: Secondary | ICD-10-CM | POA: Diagnosis not present

## 2017-03-05 DIAGNOSIS — I5022 Chronic systolic (congestive) heart failure: Secondary | ICD-10-CM | POA: Diagnosis not present

## 2017-03-05 DIAGNOSIS — M6281 Muscle weakness (generalized): Secondary | ICD-10-CM | POA: Diagnosis not present

## 2017-03-05 DIAGNOSIS — N183 Chronic kidney disease, stage 3 (moderate): Secondary | ICD-10-CM | POA: Diagnosis not present

## 2017-03-05 DIAGNOSIS — M25561 Pain in right knee: Secondary | ICD-10-CM | POA: Diagnosis not present

## 2017-03-05 DIAGNOSIS — E1122 Type 2 diabetes mellitus with diabetic chronic kidney disease: Secondary | ICD-10-CM | POA: Diagnosis not present

## 2017-03-05 DIAGNOSIS — Z95810 Presence of automatic (implantable) cardiac defibrillator: Secondary | ICD-10-CM | POA: Diagnosis not present

## 2017-03-05 DIAGNOSIS — R2689 Other abnormalities of gait and mobility: Secondary | ICD-10-CM | POA: Diagnosis not present

## 2017-03-06 DIAGNOSIS — D649 Anemia, unspecified: Secondary | ICD-10-CM | POA: Diagnosis not present

## 2017-03-06 DIAGNOSIS — M6281 Muscle weakness (generalized): Secondary | ICD-10-CM | POA: Diagnosis not present

## 2017-03-06 DIAGNOSIS — N183 Chronic kidney disease, stage 3 (moderate): Secondary | ICD-10-CM | POA: Diagnosis not present

## 2017-03-06 DIAGNOSIS — R1312 Dysphagia, oropharyngeal phase: Secondary | ICD-10-CM | POA: Diagnosis not present

## 2017-03-06 DIAGNOSIS — R2689 Other abnormalities of gait and mobility: Secondary | ICD-10-CM | POA: Diagnosis not present

## 2017-03-06 DIAGNOSIS — Z95810 Presence of automatic (implantable) cardiac defibrillator: Secondary | ICD-10-CM | POA: Diagnosis not present

## 2017-03-06 DIAGNOSIS — M1380 Other specified arthritis, unspecified site: Secondary | ICD-10-CM | POA: Diagnosis not present

## 2017-03-06 DIAGNOSIS — I2510 Atherosclerotic heart disease of native coronary artery without angina pectoris: Secondary | ICD-10-CM | POA: Diagnosis not present

## 2017-03-06 DIAGNOSIS — M25561 Pain in right knee: Secondary | ICD-10-CM | POA: Diagnosis not present

## 2017-03-06 DIAGNOSIS — E1122 Type 2 diabetes mellitus with diabetic chronic kidney disease: Secondary | ICD-10-CM | POA: Diagnosis not present

## 2017-03-06 DIAGNOSIS — R262 Difficulty in walking, not elsewhere classified: Secondary | ICD-10-CM | POA: Diagnosis not present

## 2017-03-06 DIAGNOSIS — I5022 Chronic systolic (congestive) heart failure: Secondary | ICD-10-CM | POA: Diagnosis not present

## 2017-03-07 DIAGNOSIS — M6281 Muscle weakness (generalized): Secondary | ICD-10-CM | POA: Diagnosis not present

## 2017-03-07 DIAGNOSIS — R262 Difficulty in walking, not elsewhere classified: Secondary | ICD-10-CM | POA: Diagnosis not present

## 2017-03-07 DIAGNOSIS — R2689 Other abnormalities of gait and mobility: Secondary | ICD-10-CM | POA: Diagnosis not present

## 2017-03-07 DIAGNOSIS — E1122 Type 2 diabetes mellitus with diabetic chronic kidney disease: Secondary | ICD-10-CM | POA: Diagnosis not present

## 2017-03-07 DIAGNOSIS — Z95810 Presence of automatic (implantable) cardiac defibrillator: Secondary | ICD-10-CM | POA: Diagnosis not present

## 2017-03-07 DIAGNOSIS — I5022 Chronic systolic (congestive) heart failure: Secondary | ICD-10-CM | POA: Diagnosis not present

## 2017-03-07 DIAGNOSIS — N183 Chronic kidney disease, stage 3 (moderate): Secondary | ICD-10-CM | POA: Diagnosis not present

## 2017-03-07 DIAGNOSIS — M1380 Other specified arthritis, unspecified site: Secondary | ICD-10-CM | POA: Diagnosis not present

## 2017-03-07 DIAGNOSIS — R1312 Dysphagia, oropharyngeal phase: Secondary | ICD-10-CM | POA: Diagnosis not present

## 2017-03-07 DIAGNOSIS — I2510 Atherosclerotic heart disease of native coronary artery without angina pectoris: Secondary | ICD-10-CM | POA: Diagnosis not present

## 2017-03-07 DIAGNOSIS — M25561 Pain in right knee: Secondary | ICD-10-CM | POA: Diagnosis not present

## 2017-03-07 DIAGNOSIS — D649 Anemia, unspecified: Secondary | ICD-10-CM | POA: Diagnosis not present

## 2017-03-09 DIAGNOSIS — M25561 Pain in right knee: Secondary | ICD-10-CM | POA: Diagnosis not present

## 2017-03-09 DIAGNOSIS — D649 Anemia, unspecified: Secondary | ICD-10-CM | POA: Diagnosis not present

## 2017-03-09 DIAGNOSIS — I2510 Atherosclerotic heart disease of native coronary artery without angina pectoris: Secondary | ICD-10-CM | POA: Diagnosis not present

## 2017-03-09 DIAGNOSIS — Z95810 Presence of automatic (implantable) cardiac defibrillator: Secondary | ICD-10-CM | POA: Diagnosis not present

## 2017-03-09 DIAGNOSIS — R262 Difficulty in walking, not elsewhere classified: Secondary | ICD-10-CM | POA: Diagnosis not present

## 2017-03-09 DIAGNOSIS — R1312 Dysphagia, oropharyngeal phase: Secondary | ICD-10-CM | POA: Diagnosis not present

## 2017-03-09 DIAGNOSIS — E1122 Type 2 diabetes mellitus with diabetic chronic kidney disease: Secondary | ICD-10-CM | POA: Diagnosis not present

## 2017-03-09 DIAGNOSIS — I5022 Chronic systolic (congestive) heart failure: Secondary | ICD-10-CM | POA: Diagnosis not present

## 2017-03-09 DIAGNOSIS — M1380 Other specified arthritis, unspecified site: Secondary | ICD-10-CM | POA: Diagnosis not present

## 2017-03-09 DIAGNOSIS — N183 Chronic kidney disease, stage 3 (moderate): Secondary | ICD-10-CM | POA: Diagnosis not present

## 2017-03-09 DIAGNOSIS — R2689 Other abnormalities of gait and mobility: Secondary | ICD-10-CM | POA: Diagnosis not present

## 2017-03-09 DIAGNOSIS — M6281 Muscle weakness (generalized): Secondary | ICD-10-CM | POA: Diagnosis not present

## 2017-03-10 DIAGNOSIS — R1312 Dysphagia, oropharyngeal phase: Secondary | ICD-10-CM | POA: Diagnosis not present

## 2017-03-10 DIAGNOSIS — E1122 Type 2 diabetes mellitus with diabetic chronic kidney disease: Secondary | ICD-10-CM | POA: Diagnosis not present

## 2017-03-10 DIAGNOSIS — R262 Difficulty in walking, not elsewhere classified: Secondary | ICD-10-CM | POA: Diagnosis not present

## 2017-03-10 DIAGNOSIS — D649 Anemia, unspecified: Secondary | ICD-10-CM | POA: Diagnosis not present

## 2017-03-10 DIAGNOSIS — Z95810 Presence of automatic (implantable) cardiac defibrillator: Secondary | ICD-10-CM | POA: Diagnosis not present

## 2017-03-10 DIAGNOSIS — I5022 Chronic systolic (congestive) heart failure: Secondary | ICD-10-CM | POA: Diagnosis not present

## 2017-03-10 DIAGNOSIS — M25561 Pain in right knee: Secondary | ICD-10-CM | POA: Diagnosis not present

## 2017-03-10 DIAGNOSIS — R2689 Other abnormalities of gait and mobility: Secondary | ICD-10-CM | POA: Diagnosis not present

## 2017-03-10 DIAGNOSIS — N183 Chronic kidney disease, stage 3 (moderate): Secondary | ICD-10-CM | POA: Diagnosis not present

## 2017-03-10 DIAGNOSIS — M1380 Other specified arthritis, unspecified site: Secondary | ICD-10-CM | POA: Diagnosis not present

## 2017-03-10 DIAGNOSIS — I2510 Atherosclerotic heart disease of native coronary artery without angina pectoris: Secondary | ICD-10-CM | POA: Diagnosis not present

## 2017-03-10 DIAGNOSIS — M6281 Muscle weakness (generalized): Secondary | ICD-10-CM | POA: Diagnosis not present

## 2017-03-11 DIAGNOSIS — M6281 Muscle weakness (generalized): Secondary | ICD-10-CM | POA: Diagnosis not present

## 2017-03-11 DIAGNOSIS — M25561 Pain in right knee: Secondary | ICD-10-CM | POA: Diagnosis not present

## 2017-03-11 DIAGNOSIS — N183 Chronic kidney disease, stage 3 (moderate): Secondary | ICD-10-CM | POA: Diagnosis not present

## 2017-03-11 DIAGNOSIS — D649 Anemia, unspecified: Secondary | ICD-10-CM | POA: Diagnosis not present

## 2017-03-11 DIAGNOSIS — E1122 Type 2 diabetes mellitus with diabetic chronic kidney disease: Secondary | ICD-10-CM | POA: Diagnosis not present

## 2017-03-11 DIAGNOSIS — R1312 Dysphagia, oropharyngeal phase: Secondary | ICD-10-CM | POA: Diagnosis not present

## 2017-03-11 DIAGNOSIS — R2689 Other abnormalities of gait and mobility: Secondary | ICD-10-CM | POA: Diagnosis not present

## 2017-03-11 DIAGNOSIS — R262 Difficulty in walking, not elsewhere classified: Secondary | ICD-10-CM | POA: Diagnosis not present

## 2017-03-11 DIAGNOSIS — I2510 Atherosclerotic heart disease of native coronary artery without angina pectoris: Secondary | ICD-10-CM | POA: Diagnosis not present

## 2017-03-11 DIAGNOSIS — I5022 Chronic systolic (congestive) heart failure: Secondary | ICD-10-CM | POA: Diagnosis not present

## 2017-03-11 DIAGNOSIS — Z95810 Presence of automatic (implantable) cardiac defibrillator: Secondary | ICD-10-CM | POA: Diagnosis not present

## 2017-03-11 DIAGNOSIS — M1380 Other specified arthritis, unspecified site: Secondary | ICD-10-CM | POA: Diagnosis not present

## 2017-03-14 DIAGNOSIS — R262 Difficulty in walking, not elsewhere classified: Secondary | ICD-10-CM | POA: Diagnosis not present

## 2017-03-14 DIAGNOSIS — I2510 Atherosclerotic heart disease of native coronary artery without angina pectoris: Secondary | ICD-10-CM | POA: Diagnosis not present

## 2017-03-14 DIAGNOSIS — E1122 Type 2 diabetes mellitus with diabetic chronic kidney disease: Secondary | ICD-10-CM | POA: Diagnosis not present

## 2017-03-14 DIAGNOSIS — D649 Anemia, unspecified: Secondary | ICD-10-CM | POA: Diagnosis not present

## 2017-03-14 DIAGNOSIS — R1312 Dysphagia, oropharyngeal phase: Secondary | ICD-10-CM | POA: Diagnosis not present

## 2017-03-14 DIAGNOSIS — R2689 Other abnormalities of gait and mobility: Secondary | ICD-10-CM | POA: Diagnosis not present

## 2017-03-14 DIAGNOSIS — I5022 Chronic systolic (congestive) heart failure: Secondary | ICD-10-CM | POA: Diagnosis not present

## 2017-03-14 DIAGNOSIS — M1380 Other specified arthritis, unspecified site: Secondary | ICD-10-CM | POA: Diagnosis not present

## 2017-03-14 DIAGNOSIS — M25561 Pain in right knee: Secondary | ICD-10-CM | POA: Diagnosis not present

## 2017-03-14 DIAGNOSIS — M6281 Muscle weakness (generalized): Secondary | ICD-10-CM | POA: Diagnosis not present

## 2017-03-14 DIAGNOSIS — N183 Chronic kidney disease, stage 3 (moderate): Secondary | ICD-10-CM | POA: Diagnosis not present

## 2017-03-14 DIAGNOSIS — Z95810 Presence of automatic (implantable) cardiac defibrillator: Secondary | ICD-10-CM | POA: Diagnosis not present

## 2017-03-15 DIAGNOSIS — M6281 Muscle weakness (generalized): Secondary | ICD-10-CM | POA: Diagnosis not present

## 2017-03-15 DIAGNOSIS — R1312 Dysphagia, oropharyngeal phase: Secondary | ICD-10-CM | POA: Diagnosis not present

## 2017-03-15 DIAGNOSIS — I2510 Atherosclerotic heart disease of native coronary artery without angina pectoris: Secondary | ICD-10-CM | POA: Diagnosis not present

## 2017-03-15 DIAGNOSIS — D649 Anemia, unspecified: Secondary | ICD-10-CM | POA: Diagnosis not present

## 2017-03-15 DIAGNOSIS — M25561 Pain in right knee: Secondary | ICD-10-CM | POA: Diagnosis not present

## 2017-03-15 DIAGNOSIS — R262 Difficulty in walking, not elsewhere classified: Secondary | ICD-10-CM | POA: Diagnosis not present

## 2017-03-15 DIAGNOSIS — N183 Chronic kidney disease, stage 3 (moderate): Secondary | ICD-10-CM | POA: Diagnosis not present

## 2017-03-15 DIAGNOSIS — R2689 Other abnormalities of gait and mobility: Secondary | ICD-10-CM | POA: Diagnosis not present

## 2017-03-15 DIAGNOSIS — E1122 Type 2 diabetes mellitus with diabetic chronic kidney disease: Secondary | ICD-10-CM | POA: Diagnosis not present

## 2017-03-15 DIAGNOSIS — I5022 Chronic systolic (congestive) heart failure: Secondary | ICD-10-CM | POA: Diagnosis not present

## 2017-03-15 DIAGNOSIS — M1380 Other specified arthritis, unspecified site: Secondary | ICD-10-CM | POA: Diagnosis not present

## 2017-03-15 DIAGNOSIS — Z95810 Presence of automatic (implantable) cardiac defibrillator: Secondary | ICD-10-CM | POA: Diagnosis not present

## 2017-03-16 DIAGNOSIS — M1380 Other specified arthritis, unspecified site: Secondary | ICD-10-CM | POA: Diagnosis not present

## 2017-03-16 DIAGNOSIS — M6281 Muscle weakness (generalized): Secondary | ICD-10-CM | POA: Diagnosis not present

## 2017-03-16 DIAGNOSIS — R2689 Other abnormalities of gait and mobility: Secondary | ICD-10-CM | POA: Diagnosis not present

## 2017-03-16 DIAGNOSIS — R1312 Dysphagia, oropharyngeal phase: Secondary | ICD-10-CM | POA: Diagnosis not present

## 2017-03-16 DIAGNOSIS — I2510 Atherosclerotic heart disease of native coronary artery without angina pectoris: Secondary | ICD-10-CM | POA: Diagnosis not present

## 2017-03-16 DIAGNOSIS — D649 Anemia, unspecified: Secondary | ICD-10-CM | POA: Diagnosis not present

## 2017-03-16 DIAGNOSIS — R262 Difficulty in walking, not elsewhere classified: Secondary | ICD-10-CM | POA: Diagnosis not present

## 2017-03-16 DIAGNOSIS — N183 Chronic kidney disease, stage 3 (moderate): Secondary | ICD-10-CM | POA: Diagnosis not present

## 2017-03-16 DIAGNOSIS — I5022 Chronic systolic (congestive) heart failure: Secondary | ICD-10-CM | POA: Diagnosis not present

## 2017-03-16 DIAGNOSIS — E1122 Type 2 diabetes mellitus with diabetic chronic kidney disease: Secondary | ICD-10-CM | POA: Diagnosis not present

## 2017-03-16 DIAGNOSIS — Z95810 Presence of automatic (implantable) cardiac defibrillator: Secondary | ICD-10-CM | POA: Diagnosis not present

## 2017-03-16 DIAGNOSIS — M25561 Pain in right knee: Secondary | ICD-10-CM | POA: Diagnosis not present

## 2017-03-17 DIAGNOSIS — M1380 Other specified arthritis, unspecified site: Secondary | ICD-10-CM | POA: Diagnosis not present

## 2017-03-17 DIAGNOSIS — I5022 Chronic systolic (congestive) heart failure: Secondary | ICD-10-CM | POA: Diagnosis not present

## 2017-03-17 DIAGNOSIS — I2510 Atherosclerotic heart disease of native coronary artery without angina pectoris: Secondary | ICD-10-CM | POA: Diagnosis not present

## 2017-03-17 DIAGNOSIS — M25561 Pain in right knee: Secondary | ICD-10-CM | POA: Diagnosis not present

## 2017-03-17 DIAGNOSIS — N183 Chronic kidney disease, stage 3 (moderate): Secondary | ICD-10-CM | POA: Diagnosis not present

## 2017-03-17 DIAGNOSIS — R2689 Other abnormalities of gait and mobility: Secondary | ICD-10-CM | POA: Diagnosis not present

## 2017-03-17 DIAGNOSIS — R1312 Dysphagia, oropharyngeal phase: Secondary | ICD-10-CM | POA: Diagnosis not present

## 2017-03-17 DIAGNOSIS — R262 Difficulty in walking, not elsewhere classified: Secondary | ICD-10-CM | POA: Diagnosis not present

## 2017-03-17 DIAGNOSIS — D649 Anemia, unspecified: Secondary | ICD-10-CM | POA: Diagnosis not present

## 2017-03-17 DIAGNOSIS — M6281 Muscle weakness (generalized): Secondary | ICD-10-CM | POA: Diagnosis not present

## 2017-03-17 DIAGNOSIS — E1122 Type 2 diabetes mellitus with diabetic chronic kidney disease: Secondary | ICD-10-CM | POA: Diagnosis not present

## 2017-03-17 DIAGNOSIS — Z95810 Presence of automatic (implantable) cardiac defibrillator: Secondary | ICD-10-CM | POA: Diagnosis not present

## 2017-03-20 DIAGNOSIS — R262 Difficulty in walking, not elsewhere classified: Secondary | ICD-10-CM | POA: Diagnosis not present

## 2017-03-20 DIAGNOSIS — M1380 Other specified arthritis, unspecified site: Secondary | ICD-10-CM | POA: Diagnosis not present

## 2017-03-20 DIAGNOSIS — Z95810 Presence of automatic (implantable) cardiac defibrillator: Secondary | ICD-10-CM | POA: Diagnosis not present

## 2017-03-20 DIAGNOSIS — D649 Anemia, unspecified: Secondary | ICD-10-CM | POA: Diagnosis not present

## 2017-03-20 DIAGNOSIS — E1122 Type 2 diabetes mellitus with diabetic chronic kidney disease: Secondary | ICD-10-CM | POA: Diagnosis not present

## 2017-03-20 DIAGNOSIS — I2510 Atherosclerotic heart disease of native coronary artery without angina pectoris: Secondary | ICD-10-CM | POA: Diagnosis not present

## 2017-03-20 DIAGNOSIS — I5022 Chronic systolic (congestive) heart failure: Secondary | ICD-10-CM | POA: Diagnosis not present

## 2017-03-20 DIAGNOSIS — N183 Chronic kidney disease, stage 3 (moderate): Secondary | ICD-10-CM | POA: Diagnosis not present

## 2017-03-20 DIAGNOSIS — M25561 Pain in right knee: Secondary | ICD-10-CM | POA: Diagnosis not present

## 2017-03-20 DIAGNOSIS — R2689 Other abnormalities of gait and mobility: Secondary | ICD-10-CM | POA: Diagnosis not present

## 2017-03-20 DIAGNOSIS — R1312 Dysphagia, oropharyngeal phase: Secondary | ICD-10-CM | POA: Diagnosis not present

## 2017-03-20 DIAGNOSIS — M6281 Muscle weakness (generalized): Secondary | ICD-10-CM | POA: Diagnosis not present

## 2017-03-21 DIAGNOSIS — R262 Difficulty in walking, not elsewhere classified: Secondary | ICD-10-CM | POA: Diagnosis not present

## 2017-03-21 DIAGNOSIS — I5022 Chronic systolic (congestive) heart failure: Secondary | ICD-10-CM | POA: Diagnosis not present

## 2017-03-21 DIAGNOSIS — M1380 Other specified arthritis, unspecified site: Secondary | ICD-10-CM | POA: Diagnosis not present

## 2017-03-21 DIAGNOSIS — R2689 Other abnormalities of gait and mobility: Secondary | ICD-10-CM | POA: Diagnosis not present

## 2017-03-21 DIAGNOSIS — R1312 Dysphagia, oropharyngeal phase: Secondary | ICD-10-CM | POA: Diagnosis not present

## 2017-03-21 DIAGNOSIS — I2510 Atherosclerotic heart disease of native coronary artery without angina pectoris: Secondary | ICD-10-CM | POA: Diagnosis not present

## 2017-03-21 DIAGNOSIS — E1122 Type 2 diabetes mellitus with diabetic chronic kidney disease: Secondary | ICD-10-CM | POA: Diagnosis not present

## 2017-03-21 DIAGNOSIS — M6281 Muscle weakness (generalized): Secondary | ICD-10-CM | POA: Diagnosis not present

## 2017-03-21 DIAGNOSIS — Z95810 Presence of automatic (implantable) cardiac defibrillator: Secondary | ICD-10-CM | POA: Diagnosis not present

## 2017-03-21 DIAGNOSIS — N183 Chronic kidney disease, stage 3 (moderate): Secondary | ICD-10-CM | POA: Diagnosis not present

## 2017-03-21 DIAGNOSIS — D649 Anemia, unspecified: Secondary | ICD-10-CM | POA: Diagnosis not present

## 2017-03-21 DIAGNOSIS — M25561 Pain in right knee: Secondary | ICD-10-CM | POA: Diagnosis not present

## 2017-03-22 DIAGNOSIS — R262 Difficulty in walking, not elsewhere classified: Secondary | ICD-10-CM | POA: Diagnosis not present

## 2017-03-22 DIAGNOSIS — R1312 Dysphagia, oropharyngeal phase: Secondary | ICD-10-CM | POA: Diagnosis not present

## 2017-03-22 DIAGNOSIS — Z95810 Presence of automatic (implantable) cardiac defibrillator: Secondary | ICD-10-CM | POA: Diagnosis not present

## 2017-03-22 DIAGNOSIS — D649 Anemia, unspecified: Secondary | ICD-10-CM | POA: Diagnosis not present

## 2017-03-22 DIAGNOSIS — I2510 Atherosclerotic heart disease of native coronary artery without angina pectoris: Secondary | ICD-10-CM | POA: Diagnosis not present

## 2017-03-22 DIAGNOSIS — R2689 Other abnormalities of gait and mobility: Secondary | ICD-10-CM | POA: Diagnosis not present

## 2017-03-22 DIAGNOSIS — M1380 Other specified arthritis, unspecified site: Secondary | ICD-10-CM | POA: Diagnosis not present

## 2017-03-22 DIAGNOSIS — M25561 Pain in right knee: Secondary | ICD-10-CM | POA: Diagnosis not present

## 2017-03-22 DIAGNOSIS — N183 Chronic kidney disease, stage 3 (moderate): Secondary | ICD-10-CM | POA: Diagnosis not present

## 2017-03-22 DIAGNOSIS — M6281 Muscle weakness (generalized): Secondary | ICD-10-CM | POA: Diagnosis not present

## 2017-03-22 DIAGNOSIS — I5022 Chronic systolic (congestive) heart failure: Secondary | ICD-10-CM | POA: Diagnosis not present

## 2017-03-22 DIAGNOSIS — E1122 Type 2 diabetes mellitus with diabetic chronic kidney disease: Secondary | ICD-10-CM | POA: Diagnosis not present

## 2017-03-23 DIAGNOSIS — D649 Anemia, unspecified: Secondary | ICD-10-CM | POA: Diagnosis not present

## 2017-03-23 DIAGNOSIS — R2689 Other abnormalities of gait and mobility: Secondary | ICD-10-CM | POA: Diagnosis not present

## 2017-03-23 DIAGNOSIS — M25561 Pain in right knee: Secondary | ICD-10-CM | POA: Diagnosis not present

## 2017-03-23 DIAGNOSIS — Z95810 Presence of automatic (implantable) cardiac defibrillator: Secondary | ICD-10-CM | POA: Diagnosis not present

## 2017-03-23 DIAGNOSIS — N183 Chronic kidney disease, stage 3 (moderate): Secondary | ICD-10-CM | POA: Diagnosis not present

## 2017-03-23 DIAGNOSIS — M6281 Muscle weakness (generalized): Secondary | ICD-10-CM | POA: Diagnosis not present

## 2017-03-23 DIAGNOSIS — I2510 Atherosclerotic heart disease of native coronary artery without angina pectoris: Secondary | ICD-10-CM | POA: Diagnosis not present

## 2017-03-23 DIAGNOSIS — E1122 Type 2 diabetes mellitus with diabetic chronic kidney disease: Secondary | ICD-10-CM | POA: Diagnosis not present

## 2017-03-23 DIAGNOSIS — M1380 Other specified arthritis, unspecified site: Secondary | ICD-10-CM | POA: Diagnosis not present

## 2017-03-23 DIAGNOSIS — R1312 Dysphagia, oropharyngeal phase: Secondary | ICD-10-CM | POA: Diagnosis not present

## 2017-03-23 DIAGNOSIS — R262 Difficulty in walking, not elsewhere classified: Secondary | ICD-10-CM | POA: Diagnosis not present

## 2017-03-23 DIAGNOSIS — I5022 Chronic systolic (congestive) heart failure: Secondary | ICD-10-CM | POA: Diagnosis not present

## 2017-03-24 DIAGNOSIS — R1312 Dysphagia, oropharyngeal phase: Secondary | ICD-10-CM | POA: Diagnosis not present

## 2017-03-24 DIAGNOSIS — M25561 Pain in right knee: Secondary | ICD-10-CM | POA: Diagnosis not present

## 2017-03-24 DIAGNOSIS — M1380 Other specified arthritis, unspecified site: Secondary | ICD-10-CM | POA: Diagnosis not present

## 2017-03-24 DIAGNOSIS — R2689 Other abnormalities of gait and mobility: Secondary | ICD-10-CM | POA: Diagnosis not present

## 2017-03-24 DIAGNOSIS — I2510 Atherosclerotic heart disease of native coronary artery without angina pectoris: Secondary | ICD-10-CM | POA: Diagnosis not present

## 2017-03-24 DIAGNOSIS — I5022 Chronic systolic (congestive) heart failure: Secondary | ICD-10-CM | POA: Diagnosis not present

## 2017-03-24 DIAGNOSIS — N183 Chronic kidney disease, stage 3 (moderate): Secondary | ICD-10-CM | POA: Diagnosis not present

## 2017-03-24 DIAGNOSIS — D649 Anemia, unspecified: Secondary | ICD-10-CM | POA: Diagnosis not present

## 2017-03-24 DIAGNOSIS — R262 Difficulty in walking, not elsewhere classified: Secondary | ICD-10-CM | POA: Diagnosis not present

## 2017-03-24 DIAGNOSIS — Z95810 Presence of automatic (implantable) cardiac defibrillator: Secondary | ICD-10-CM | POA: Diagnosis not present

## 2017-03-24 DIAGNOSIS — E1122 Type 2 diabetes mellitus with diabetic chronic kidney disease: Secondary | ICD-10-CM | POA: Diagnosis not present

## 2017-03-24 DIAGNOSIS — M6281 Muscle weakness (generalized): Secondary | ICD-10-CM | POA: Diagnosis not present

## 2017-03-26 DIAGNOSIS — M1380 Other specified arthritis, unspecified site: Secondary | ICD-10-CM | POA: Diagnosis not present

## 2017-03-26 DIAGNOSIS — M25561 Pain in right knee: Secondary | ICD-10-CM | POA: Diagnosis not present

## 2017-03-26 DIAGNOSIS — I2510 Atherosclerotic heart disease of native coronary artery without angina pectoris: Secondary | ICD-10-CM | POA: Diagnosis not present

## 2017-03-26 DIAGNOSIS — N183 Chronic kidney disease, stage 3 (moderate): Secondary | ICD-10-CM | POA: Diagnosis not present

## 2017-03-26 DIAGNOSIS — R262 Difficulty in walking, not elsewhere classified: Secondary | ICD-10-CM | POA: Diagnosis not present

## 2017-03-26 DIAGNOSIS — Z95810 Presence of automatic (implantable) cardiac defibrillator: Secondary | ICD-10-CM | POA: Diagnosis not present

## 2017-03-26 DIAGNOSIS — R1312 Dysphagia, oropharyngeal phase: Secondary | ICD-10-CM | POA: Diagnosis not present

## 2017-03-26 DIAGNOSIS — M6281 Muscle weakness (generalized): Secondary | ICD-10-CM | POA: Diagnosis not present

## 2017-03-26 DIAGNOSIS — D649 Anemia, unspecified: Secondary | ICD-10-CM | POA: Diagnosis not present

## 2017-03-26 DIAGNOSIS — R2689 Other abnormalities of gait and mobility: Secondary | ICD-10-CM | POA: Diagnosis not present

## 2017-03-26 DIAGNOSIS — E1122 Type 2 diabetes mellitus with diabetic chronic kidney disease: Secondary | ICD-10-CM | POA: Diagnosis not present

## 2017-03-26 DIAGNOSIS — I5022 Chronic systolic (congestive) heart failure: Secondary | ICD-10-CM | POA: Diagnosis not present

## 2017-03-27 DIAGNOSIS — D649 Anemia, unspecified: Secondary | ICD-10-CM | POA: Diagnosis not present

## 2017-03-27 DIAGNOSIS — M1380 Other specified arthritis, unspecified site: Secondary | ICD-10-CM | POA: Diagnosis not present

## 2017-03-27 DIAGNOSIS — Z95810 Presence of automatic (implantable) cardiac defibrillator: Secondary | ICD-10-CM | POA: Diagnosis not present

## 2017-03-27 DIAGNOSIS — I5022 Chronic systolic (congestive) heart failure: Secondary | ICD-10-CM | POA: Diagnosis not present

## 2017-03-27 DIAGNOSIS — R1312 Dysphagia, oropharyngeal phase: Secondary | ICD-10-CM | POA: Diagnosis not present

## 2017-03-27 DIAGNOSIS — R262 Difficulty in walking, not elsewhere classified: Secondary | ICD-10-CM | POA: Diagnosis not present

## 2017-03-27 DIAGNOSIS — N183 Chronic kidney disease, stage 3 (moderate): Secondary | ICD-10-CM | POA: Diagnosis not present

## 2017-03-27 DIAGNOSIS — R2689 Other abnormalities of gait and mobility: Secondary | ICD-10-CM | POA: Diagnosis not present

## 2017-03-27 DIAGNOSIS — I2510 Atherosclerotic heart disease of native coronary artery without angina pectoris: Secondary | ICD-10-CM | POA: Diagnosis not present

## 2017-03-27 DIAGNOSIS — E1122 Type 2 diabetes mellitus with diabetic chronic kidney disease: Secondary | ICD-10-CM | POA: Diagnosis not present

## 2017-03-27 DIAGNOSIS — M25561 Pain in right knee: Secondary | ICD-10-CM | POA: Diagnosis not present

## 2017-03-27 DIAGNOSIS — M6281 Muscle weakness (generalized): Secondary | ICD-10-CM | POA: Diagnosis not present

## 2017-03-29 DIAGNOSIS — Z95810 Presence of automatic (implantable) cardiac defibrillator: Secondary | ICD-10-CM | POA: Diagnosis not present

## 2017-03-29 DIAGNOSIS — M1380 Other specified arthritis, unspecified site: Secondary | ICD-10-CM | POA: Diagnosis not present

## 2017-03-29 DIAGNOSIS — R2689 Other abnormalities of gait and mobility: Secondary | ICD-10-CM | POA: Diagnosis not present

## 2017-03-29 DIAGNOSIS — I2510 Atherosclerotic heart disease of native coronary artery without angina pectoris: Secondary | ICD-10-CM | POA: Diagnosis not present

## 2017-03-29 DIAGNOSIS — N183 Chronic kidney disease, stage 3 (moderate): Secondary | ICD-10-CM | POA: Diagnosis not present

## 2017-03-29 DIAGNOSIS — R262 Difficulty in walking, not elsewhere classified: Secondary | ICD-10-CM | POA: Diagnosis not present

## 2017-03-29 DIAGNOSIS — R1312 Dysphagia, oropharyngeal phase: Secondary | ICD-10-CM | POA: Diagnosis not present

## 2017-03-29 DIAGNOSIS — M25561 Pain in right knee: Secondary | ICD-10-CM | POA: Diagnosis not present

## 2017-03-29 DIAGNOSIS — E1122 Type 2 diabetes mellitus with diabetic chronic kidney disease: Secondary | ICD-10-CM | POA: Diagnosis not present

## 2017-03-29 DIAGNOSIS — D649 Anemia, unspecified: Secondary | ICD-10-CM | POA: Diagnosis not present

## 2017-03-29 DIAGNOSIS — I5022 Chronic systolic (congestive) heart failure: Secondary | ICD-10-CM | POA: Diagnosis not present

## 2017-03-29 DIAGNOSIS — M6281 Muscle weakness (generalized): Secondary | ICD-10-CM | POA: Diagnosis not present

## 2017-03-30 DIAGNOSIS — R2689 Other abnormalities of gait and mobility: Secondary | ICD-10-CM | POA: Diagnosis not present

## 2017-03-30 DIAGNOSIS — R1312 Dysphagia, oropharyngeal phase: Secondary | ICD-10-CM | POA: Diagnosis not present

## 2017-03-30 DIAGNOSIS — M1380 Other specified arthritis, unspecified site: Secondary | ICD-10-CM | POA: Diagnosis not present

## 2017-03-30 DIAGNOSIS — R262 Difficulty in walking, not elsewhere classified: Secondary | ICD-10-CM | POA: Diagnosis not present

## 2017-03-30 DIAGNOSIS — N183 Chronic kidney disease, stage 3 (moderate): Secondary | ICD-10-CM | POA: Diagnosis not present

## 2017-03-30 DIAGNOSIS — D649 Anemia, unspecified: Secondary | ICD-10-CM | POA: Diagnosis not present

## 2017-03-30 DIAGNOSIS — M6281 Muscle weakness (generalized): Secondary | ICD-10-CM | POA: Diagnosis not present

## 2017-03-30 DIAGNOSIS — M25561 Pain in right knee: Secondary | ICD-10-CM | POA: Diagnosis not present

## 2017-03-30 DIAGNOSIS — E1122 Type 2 diabetes mellitus with diabetic chronic kidney disease: Secondary | ICD-10-CM | POA: Diagnosis not present

## 2017-03-30 DIAGNOSIS — I5022 Chronic systolic (congestive) heart failure: Secondary | ICD-10-CM | POA: Diagnosis not present

## 2017-03-30 DIAGNOSIS — Z95810 Presence of automatic (implantable) cardiac defibrillator: Secondary | ICD-10-CM | POA: Diagnosis not present

## 2017-03-30 DIAGNOSIS — I2510 Atherosclerotic heart disease of native coronary artery without angina pectoris: Secondary | ICD-10-CM | POA: Diagnosis not present

## 2017-03-31 DIAGNOSIS — M1380 Other specified arthritis, unspecified site: Secondary | ICD-10-CM | POA: Diagnosis not present

## 2017-03-31 DIAGNOSIS — R262 Difficulty in walking, not elsewhere classified: Secondary | ICD-10-CM | POA: Diagnosis not present

## 2017-03-31 DIAGNOSIS — R1312 Dysphagia, oropharyngeal phase: Secondary | ICD-10-CM | POA: Diagnosis not present

## 2017-03-31 DIAGNOSIS — E1122 Type 2 diabetes mellitus with diabetic chronic kidney disease: Secondary | ICD-10-CM | POA: Diagnosis not present

## 2017-03-31 DIAGNOSIS — D649 Anemia, unspecified: Secondary | ICD-10-CM | POA: Diagnosis not present

## 2017-03-31 DIAGNOSIS — M6281 Muscle weakness (generalized): Secondary | ICD-10-CM | POA: Diagnosis not present

## 2017-03-31 DIAGNOSIS — I2510 Atherosclerotic heart disease of native coronary artery without angina pectoris: Secondary | ICD-10-CM | POA: Diagnosis not present

## 2017-03-31 DIAGNOSIS — M25561 Pain in right knee: Secondary | ICD-10-CM | POA: Diagnosis not present

## 2017-03-31 DIAGNOSIS — R2689 Other abnormalities of gait and mobility: Secondary | ICD-10-CM | POA: Diagnosis not present

## 2017-03-31 DIAGNOSIS — Z95810 Presence of automatic (implantable) cardiac defibrillator: Secondary | ICD-10-CM | POA: Diagnosis not present

## 2017-03-31 DIAGNOSIS — N183 Chronic kidney disease, stage 3 (moderate): Secondary | ICD-10-CM | POA: Diagnosis not present

## 2017-03-31 DIAGNOSIS — I5022 Chronic systolic (congestive) heart failure: Secondary | ICD-10-CM | POA: Diagnosis not present

## 2017-04-02 DIAGNOSIS — E1122 Type 2 diabetes mellitus with diabetic chronic kidney disease: Secondary | ICD-10-CM | POA: Diagnosis not present

## 2017-04-02 DIAGNOSIS — I2510 Atherosclerotic heart disease of native coronary artery without angina pectoris: Secondary | ICD-10-CM | POA: Diagnosis not present

## 2017-04-02 DIAGNOSIS — M6281 Muscle weakness (generalized): Secondary | ICD-10-CM | POA: Diagnosis not present

## 2017-04-02 DIAGNOSIS — R2689 Other abnormalities of gait and mobility: Secondary | ICD-10-CM | POA: Diagnosis not present

## 2017-04-02 DIAGNOSIS — M1380 Other specified arthritis, unspecified site: Secondary | ICD-10-CM | POA: Diagnosis not present

## 2017-04-02 DIAGNOSIS — R1312 Dysphagia, oropharyngeal phase: Secondary | ICD-10-CM | POA: Diagnosis not present

## 2017-04-02 DIAGNOSIS — Z95810 Presence of automatic (implantable) cardiac defibrillator: Secondary | ICD-10-CM | POA: Diagnosis not present

## 2017-04-02 DIAGNOSIS — R262 Difficulty in walking, not elsewhere classified: Secondary | ICD-10-CM | POA: Diagnosis not present

## 2017-04-02 DIAGNOSIS — I5022 Chronic systolic (congestive) heart failure: Secondary | ICD-10-CM | POA: Diagnosis not present

## 2017-04-02 DIAGNOSIS — D649 Anemia, unspecified: Secondary | ICD-10-CM | POA: Diagnosis not present

## 2017-04-02 DIAGNOSIS — N183 Chronic kidney disease, stage 3 (moderate): Secondary | ICD-10-CM | POA: Diagnosis not present

## 2017-04-02 DIAGNOSIS — M25561 Pain in right knee: Secondary | ICD-10-CM | POA: Diagnosis not present

## 2017-04-03 DIAGNOSIS — E1122 Type 2 diabetes mellitus with diabetic chronic kidney disease: Secondary | ICD-10-CM | POA: Diagnosis not present

## 2017-04-03 DIAGNOSIS — N183 Chronic kidney disease, stage 3 (moderate): Secondary | ICD-10-CM | POA: Diagnosis not present

## 2017-04-03 DIAGNOSIS — I2510 Atherosclerotic heart disease of native coronary artery without angina pectoris: Secondary | ICD-10-CM | POA: Diagnosis not present

## 2017-04-03 DIAGNOSIS — R262 Difficulty in walking, not elsewhere classified: Secondary | ICD-10-CM | POA: Diagnosis not present

## 2017-04-03 DIAGNOSIS — D649 Anemia, unspecified: Secondary | ICD-10-CM | POA: Diagnosis not present

## 2017-04-03 DIAGNOSIS — M6281 Muscle weakness (generalized): Secondary | ICD-10-CM | POA: Diagnosis not present

## 2017-04-03 DIAGNOSIS — I5022 Chronic systolic (congestive) heart failure: Secondary | ICD-10-CM | POA: Diagnosis not present

## 2017-04-03 DIAGNOSIS — Z95810 Presence of automatic (implantable) cardiac defibrillator: Secondary | ICD-10-CM | POA: Diagnosis not present

## 2017-04-03 DIAGNOSIS — M1380 Other specified arthritis, unspecified site: Secondary | ICD-10-CM | POA: Diagnosis not present

## 2017-04-03 DIAGNOSIS — R1312 Dysphagia, oropharyngeal phase: Secondary | ICD-10-CM | POA: Diagnosis not present

## 2017-04-03 DIAGNOSIS — M25561 Pain in right knee: Secondary | ICD-10-CM | POA: Diagnosis not present

## 2017-04-03 DIAGNOSIS — R2689 Other abnormalities of gait and mobility: Secondary | ICD-10-CM | POA: Diagnosis not present

## 2017-04-05 DIAGNOSIS — N183 Chronic kidney disease, stage 3 (moderate): Secondary | ICD-10-CM | POA: Diagnosis not present

## 2017-04-05 DIAGNOSIS — R1312 Dysphagia, oropharyngeal phase: Secondary | ICD-10-CM | POA: Diagnosis not present

## 2017-04-05 DIAGNOSIS — Z95810 Presence of automatic (implantable) cardiac defibrillator: Secondary | ICD-10-CM | POA: Diagnosis not present

## 2017-04-05 DIAGNOSIS — M6281 Muscle weakness (generalized): Secondary | ICD-10-CM | POA: Diagnosis not present

## 2017-04-05 DIAGNOSIS — I2510 Atherosclerotic heart disease of native coronary artery without angina pectoris: Secondary | ICD-10-CM | POA: Diagnosis not present

## 2017-04-05 DIAGNOSIS — M25561 Pain in right knee: Secondary | ICD-10-CM | POA: Diagnosis not present

## 2017-04-05 DIAGNOSIS — E1122 Type 2 diabetes mellitus with diabetic chronic kidney disease: Secondary | ICD-10-CM | POA: Diagnosis not present

## 2017-04-05 DIAGNOSIS — M1380 Other specified arthritis, unspecified site: Secondary | ICD-10-CM | POA: Diagnosis not present

## 2017-04-05 DIAGNOSIS — I5022 Chronic systolic (congestive) heart failure: Secondary | ICD-10-CM | POA: Diagnosis not present

## 2017-04-05 DIAGNOSIS — D649 Anemia, unspecified: Secondary | ICD-10-CM | POA: Diagnosis not present

## 2017-04-05 DIAGNOSIS — R2689 Other abnormalities of gait and mobility: Secondary | ICD-10-CM | POA: Diagnosis not present

## 2017-04-05 DIAGNOSIS — R262 Difficulty in walking, not elsewhere classified: Secondary | ICD-10-CM | POA: Diagnosis not present

## 2017-04-06 DIAGNOSIS — R262 Difficulty in walking, not elsewhere classified: Secondary | ICD-10-CM | POA: Diagnosis not present

## 2017-04-06 DIAGNOSIS — I5022 Chronic systolic (congestive) heart failure: Secondary | ICD-10-CM | POA: Diagnosis not present

## 2017-04-06 DIAGNOSIS — R2689 Other abnormalities of gait and mobility: Secondary | ICD-10-CM | POA: Diagnosis not present

## 2017-04-06 DIAGNOSIS — M1380 Other specified arthritis, unspecified site: Secondary | ICD-10-CM | POA: Diagnosis not present

## 2017-04-06 DIAGNOSIS — M25561 Pain in right knee: Secondary | ICD-10-CM | POA: Diagnosis not present

## 2017-04-06 DIAGNOSIS — Z95810 Presence of automatic (implantable) cardiac defibrillator: Secondary | ICD-10-CM | POA: Diagnosis not present

## 2017-04-06 DIAGNOSIS — I2510 Atherosclerotic heart disease of native coronary artery without angina pectoris: Secondary | ICD-10-CM | POA: Diagnosis not present

## 2017-04-06 DIAGNOSIS — E1122 Type 2 diabetes mellitus with diabetic chronic kidney disease: Secondary | ICD-10-CM | POA: Diagnosis not present

## 2017-04-06 DIAGNOSIS — R1312 Dysphagia, oropharyngeal phase: Secondary | ICD-10-CM | POA: Diagnosis not present

## 2017-04-06 DIAGNOSIS — N183 Chronic kidney disease, stage 3 (moderate): Secondary | ICD-10-CM | POA: Diagnosis not present

## 2017-04-06 DIAGNOSIS — D649 Anemia, unspecified: Secondary | ICD-10-CM | POA: Diagnosis not present

## 2017-04-06 DIAGNOSIS — M6281 Muscle weakness (generalized): Secondary | ICD-10-CM | POA: Diagnosis not present

## 2017-04-07 DIAGNOSIS — D649 Anemia, unspecified: Secondary | ICD-10-CM | POA: Diagnosis not present

## 2017-04-07 DIAGNOSIS — I5022 Chronic systolic (congestive) heart failure: Secondary | ICD-10-CM | POA: Diagnosis not present

## 2017-04-07 DIAGNOSIS — Z95810 Presence of automatic (implantable) cardiac defibrillator: Secondary | ICD-10-CM | POA: Diagnosis not present

## 2017-04-07 DIAGNOSIS — I69359 Hemiplegia and hemiparesis following cerebral infarction affecting unspecified side: Secondary | ICD-10-CM | POA: Diagnosis not present

## 2017-04-07 DIAGNOSIS — I2510 Atherosclerotic heart disease of native coronary artery without angina pectoris: Secondary | ICD-10-CM | POA: Diagnosis not present

## 2017-04-07 DIAGNOSIS — E1122 Type 2 diabetes mellitus with diabetic chronic kidney disease: Secondary | ICD-10-CM | POA: Diagnosis not present

## 2017-04-07 DIAGNOSIS — R262 Difficulty in walking, not elsewhere classified: Secondary | ICD-10-CM | POA: Diagnosis not present

## 2017-04-07 DIAGNOSIS — R1312 Dysphagia, oropharyngeal phase: Secondary | ICD-10-CM | POA: Diagnosis not present

## 2017-04-07 DIAGNOSIS — M6281 Muscle weakness (generalized): Secondary | ICD-10-CM | POA: Diagnosis not present

## 2017-04-07 DIAGNOSIS — M25561 Pain in right knee: Secondary | ICD-10-CM | POA: Diagnosis not present

## 2017-04-07 DIAGNOSIS — R2689 Other abnormalities of gait and mobility: Secondary | ICD-10-CM | POA: Diagnosis not present

## 2017-04-07 DIAGNOSIS — M1380 Other specified arthritis, unspecified site: Secondary | ICD-10-CM | POA: Diagnosis not present

## 2017-04-07 DIAGNOSIS — N183 Chronic kidney disease, stage 3 (moderate): Secondary | ICD-10-CM | POA: Diagnosis not present

## 2017-04-08 DIAGNOSIS — M6281 Muscle weakness (generalized): Secondary | ICD-10-CM | POA: Diagnosis not present

## 2017-04-08 DIAGNOSIS — E1122 Type 2 diabetes mellitus with diabetic chronic kidney disease: Secondary | ICD-10-CM | POA: Diagnosis not present

## 2017-04-08 DIAGNOSIS — R262 Difficulty in walking, not elsewhere classified: Secondary | ICD-10-CM | POA: Diagnosis not present

## 2017-04-08 DIAGNOSIS — Z95810 Presence of automatic (implantable) cardiac defibrillator: Secondary | ICD-10-CM | POA: Diagnosis not present

## 2017-04-08 DIAGNOSIS — I5022 Chronic systolic (congestive) heart failure: Secondary | ICD-10-CM | POA: Diagnosis not present

## 2017-04-08 DIAGNOSIS — N183 Chronic kidney disease, stage 3 (moderate): Secondary | ICD-10-CM | POA: Diagnosis not present

## 2017-04-08 DIAGNOSIS — R1312 Dysphagia, oropharyngeal phase: Secondary | ICD-10-CM | POA: Diagnosis not present

## 2017-04-08 DIAGNOSIS — R2689 Other abnormalities of gait and mobility: Secondary | ICD-10-CM | POA: Diagnosis not present

## 2017-04-08 DIAGNOSIS — D649 Anemia, unspecified: Secondary | ICD-10-CM | POA: Diagnosis not present

## 2017-04-08 DIAGNOSIS — M25561 Pain in right knee: Secondary | ICD-10-CM | POA: Diagnosis not present

## 2017-04-08 DIAGNOSIS — I2510 Atherosclerotic heart disease of native coronary artery without angina pectoris: Secondary | ICD-10-CM | POA: Diagnosis not present

## 2017-04-08 DIAGNOSIS — M1380 Other specified arthritis, unspecified site: Secondary | ICD-10-CM | POA: Diagnosis not present

## 2017-04-10 DIAGNOSIS — R2689 Other abnormalities of gait and mobility: Secondary | ICD-10-CM | POA: Diagnosis not present

## 2017-04-10 DIAGNOSIS — N183 Chronic kidney disease, stage 3 (moderate): Secondary | ICD-10-CM | POA: Diagnosis not present

## 2017-04-10 DIAGNOSIS — M1380 Other specified arthritis, unspecified site: Secondary | ICD-10-CM | POA: Diagnosis not present

## 2017-04-10 DIAGNOSIS — M25561 Pain in right knee: Secondary | ICD-10-CM | POA: Diagnosis not present

## 2017-04-10 DIAGNOSIS — I5022 Chronic systolic (congestive) heart failure: Secondary | ICD-10-CM | POA: Diagnosis not present

## 2017-04-10 DIAGNOSIS — I2510 Atherosclerotic heart disease of native coronary artery without angina pectoris: Secondary | ICD-10-CM | POA: Diagnosis not present

## 2017-04-10 DIAGNOSIS — Z95810 Presence of automatic (implantable) cardiac defibrillator: Secondary | ICD-10-CM | POA: Diagnosis not present

## 2017-04-10 DIAGNOSIS — R1312 Dysphagia, oropharyngeal phase: Secondary | ICD-10-CM | POA: Diagnosis not present

## 2017-04-10 DIAGNOSIS — E1122 Type 2 diabetes mellitus with diabetic chronic kidney disease: Secondary | ICD-10-CM | POA: Diagnosis not present

## 2017-04-10 DIAGNOSIS — R262 Difficulty in walking, not elsewhere classified: Secondary | ICD-10-CM | POA: Diagnosis not present

## 2017-04-10 DIAGNOSIS — M6281 Muscle weakness (generalized): Secondary | ICD-10-CM | POA: Diagnosis not present

## 2017-04-10 DIAGNOSIS — D649 Anemia, unspecified: Secondary | ICD-10-CM | POA: Diagnosis not present

## 2017-04-12 DIAGNOSIS — I5022 Chronic systolic (congestive) heart failure: Secondary | ICD-10-CM | POA: Diagnosis not present

## 2017-04-12 DIAGNOSIS — N183 Chronic kidney disease, stage 3 (moderate): Secondary | ICD-10-CM | POA: Diagnosis not present

## 2017-04-12 DIAGNOSIS — M25561 Pain in right knee: Secondary | ICD-10-CM | POA: Diagnosis not present

## 2017-04-12 DIAGNOSIS — D649 Anemia, unspecified: Secondary | ICD-10-CM | POA: Diagnosis not present

## 2017-04-12 DIAGNOSIS — Z95810 Presence of automatic (implantable) cardiac defibrillator: Secondary | ICD-10-CM | POA: Diagnosis not present

## 2017-04-12 DIAGNOSIS — R1312 Dysphagia, oropharyngeal phase: Secondary | ICD-10-CM | POA: Diagnosis not present

## 2017-04-12 DIAGNOSIS — I2510 Atherosclerotic heart disease of native coronary artery without angina pectoris: Secondary | ICD-10-CM | POA: Diagnosis not present

## 2017-04-12 DIAGNOSIS — E1122 Type 2 diabetes mellitus with diabetic chronic kidney disease: Secondary | ICD-10-CM | POA: Diagnosis not present

## 2017-04-12 DIAGNOSIS — M1380 Other specified arthritis, unspecified site: Secondary | ICD-10-CM | POA: Diagnosis not present

## 2017-04-12 DIAGNOSIS — M6281 Muscle weakness (generalized): Secondary | ICD-10-CM | POA: Diagnosis not present

## 2017-04-12 DIAGNOSIS — R262 Difficulty in walking, not elsewhere classified: Secondary | ICD-10-CM | POA: Diagnosis not present

## 2017-04-12 DIAGNOSIS — R2689 Other abnormalities of gait and mobility: Secondary | ICD-10-CM | POA: Diagnosis not present

## 2017-04-13 DIAGNOSIS — M25561 Pain in right knee: Secondary | ICD-10-CM | POA: Diagnosis not present

## 2017-04-13 DIAGNOSIS — R262 Difficulty in walking, not elsewhere classified: Secondary | ICD-10-CM | POA: Diagnosis not present

## 2017-04-13 DIAGNOSIS — Z95810 Presence of automatic (implantable) cardiac defibrillator: Secondary | ICD-10-CM | POA: Diagnosis not present

## 2017-04-13 DIAGNOSIS — R2689 Other abnormalities of gait and mobility: Secondary | ICD-10-CM | POA: Diagnosis not present

## 2017-04-13 DIAGNOSIS — N183 Chronic kidney disease, stage 3 (moderate): Secondary | ICD-10-CM | POA: Diagnosis not present

## 2017-04-13 DIAGNOSIS — M1380 Other specified arthritis, unspecified site: Secondary | ICD-10-CM | POA: Diagnosis not present

## 2017-04-13 DIAGNOSIS — D649 Anemia, unspecified: Secondary | ICD-10-CM | POA: Diagnosis not present

## 2017-04-13 DIAGNOSIS — E1122 Type 2 diabetes mellitus with diabetic chronic kidney disease: Secondary | ICD-10-CM | POA: Diagnosis not present

## 2017-04-13 DIAGNOSIS — M6281 Muscle weakness (generalized): Secondary | ICD-10-CM | POA: Diagnosis not present

## 2017-04-13 DIAGNOSIS — I5022 Chronic systolic (congestive) heart failure: Secondary | ICD-10-CM | POA: Diagnosis not present

## 2017-04-13 DIAGNOSIS — I2510 Atherosclerotic heart disease of native coronary artery without angina pectoris: Secondary | ICD-10-CM | POA: Diagnosis not present

## 2017-04-13 DIAGNOSIS — R1312 Dysphagia, oropharyngeal phase: Secondary | ICD-10-CM | POA: Diagnosis not present

## 2017-04-14 DIAGNOSIS — Z89422 Acquired absence of other left toe(s): Secondary | ICD-10-CM | POA: Diagnosis not present

## 2017-04-14 DIAGNOSIS — B351 Tinea unguium: Secondary | ICD-10-CM | POA: Diagnosis not present

## 2017-04-14 DIAGNOSIS — I2510 Atherosclerotic heart disease of native coronary artery without angina pectoris: Secondary | ICD-10-CM | POA: Diagnosis not present

## 2017-04-14 DIAGNOSIS — D649 Anemia, unspecified: Secondary | ICD-10-CM | POA: Diagnosis not present

## 2017-04-14 DIAGNOSIS — I739 Peripheral vascular disease, unspecified: Secondary | ICD-10-CM | POA: Diagnosis not present

## 2017-04-14 DIAGNOSIS — R1312 Dysphagia, oropharyngeal phase: Secondary | ICD-10-CM | POA: Diagnosis not present

## 2017-04-14 DIAGNOSIS — M6281 Muscle weakness (generalized): Secondary | ICD-10-CM | POA: Diagnosis not present

## 2017-04-14 DIAGNOSIS — M1380 Other specified arthritis, unspecified site: Secondary | ICD-10-CM | POA: Diagnosis not present

## 2017-04-14 DIAGNOSIS — M25561 Pain in right knee: Secondary | ICD-10-CM | POA: Diagnosis not present

## 2017-04-14 DIAGNOSIS — N183 Chronic kidney disease, stage 3 (moderate): Secondary | ICD-10-CM | POA: Diagnosis not present

## 2017-04-14 DIAGNOSIS — I5022 Chronic systolic (congestive) heart failure: Secondary | ICD-10-CM | POA: Diagnosis not present

## 2017-04-14 DIAGNOSIS — R2689 Other abnormalities of gait and mobility: Secondary | ICD-10-CM | POA: Diagnosis not present

## 2017-04-14 DIAGNOSIS — E1122 Type 2 diabetes mellitus with diabetic chronic kidney disease: Secondary | ICD-10-CM | POA: Diagnosis not present

## 2017-04-14 DIAGNOSIS — R262 Difficulty in walking, not elsewhere classified: Secondary | ICD-10-CM | POA: Diagnosis not present

## 2017-04-14 DIAGNOSIS — Z95810 Presence of automatic (implantable) cardiac defibrillator: Secondary | ICD-10-CM | POA: Diagnosis not present

## 2017-04-15 DIAGNOSIS — I5022 Chronic systolic (congestive) heart failure: Secondary | ICD-10-CM | POA: Diagnosis not present

## 2017-04-15 DIAGNOSIS — I2510 Atherosclerotic heart disease of native coronary artery without angina pectoris: Secondary | ICD-10-CM | POA: Diagnosis not present

## 2017-04-15 DIAGNOSIS — D649 Anemia, unspecified: Secondary | ICD-10-CM | POA: Diagnosis not present

## 2017-04-15 DIAGNOSIS — Z95810 Presence of automatic (implantable) cardiac defibrillator: Secondary | ICD-10-CM | POA: Diagnosis not present

## 2017-04-15 DIAGNOSIS — R1312 Dysphagia, oropharyngeal phase: Secondary | ICD-10-CM | POA: Diagnosis not present

## 2017-04-15 DIAGNOSIS — M1380 Other specified arthritis, unspecified site: Secondary | ICD-10-CM | POA: Diagnosis not present

## 2017-04-15 DIAGNOSIS — R2689 Other abnormalities of gait and mobility: Secondary | ICD-10-CM | POA: Diagnosis not present

## 2017-04-15 DIAGNOSIS — E1122 Type 2 diabetes mellitus with diabetic chronic kidney disease: Secondary | ICD-10-CM | POA: Diagnosis not present

## 2017-04-15 DIAGNOSIS — M6281 Muscle weakness (generalized): Secondary | ICD-10-CM | POA: Diagnosis not present

## 2017-04-15 DIAGNOSIS — M25561 Pain in right knee: Secondary | ICD-10-CM | POA: Diagnosis not present

## 2017-04-15 DIAGNOSIS — R262 Difficulty in walking, not elsewhere classified: Secondary | ICD-10-CM | POA: Diagnosis not present

## 2017-04-15 DIAGNOSIS — N183 Chronic kidney disease, stage 3 (moderate): Secondary | ICD-10-CM | POA: Diagnosis not present

## 2017-04-17 DIAGNOSIS — M6281 Muscle weakness (generalized): Secondary | ICD-10-CM | POA: Diagnosis not present

## 2017-04-17 DIAGNOSIS — M1380 Other specified arthritis, unspecified site: Secondary | ICD-10-CM | POA: Diagnosis not present

## 2017-04-17 DIAGNOSIS — D649 Anemia, unspecified: Secondary | ICD-10-CM | POA: Diagnosis not present

## 2017-04-17 DIAGNOSIS — N183 Chronic kidney disease, stage 3 (moderate): Secondary | ICD-10-CM | POA: Diagnosis not present

## 2017-04-17 DIAGNOSIS — I2510 Atherosclerotic heart disease of native coronary artery without angina pectoris: Secondary | ICD-10-CM | POA: Diagnosis not present

## 2017-04-17 DIAGNOSIS — I5022 Chronic systolic (congestive) heart failure: Secondary | ICD-10-CM | POA: Diagnosis not present

## 2017-04-17 DIAGNOSIS — Z95810 Presence of automatic (implantable) cardiac defibrillator: Secondary | ICD-10-CM | POA: Diagnosis not present

## 2017-04-17 DIAGNOSIS — R1312 Dysphagia, oropharyngeal phase: Secondary | ICD-10-CM | POA: Diagnosis not present

## 2017-04-17 DIAGNOSIS — M25561 Pain in right knee: Secondary | ICD-10-CM | POA: Diagnosis not present

## 2017-04-17 DIAGNOSIS — R2689 Other abnormalities of gait and mobility: Secondary | ICD-10-CM | POA: Diagnosis not present

## 2017-04-17 DIAGNOSIS — E1122 Type 2 diabetes mellitus with diabetic chronic kidney disease: Secondary | ICD-10-CM | POA: Diagnosis not present

## 2017-04-17 DIAGNOSIS — R262 Difficulty in walking, not elsewhere classified: Secondary | ICD-10-CM | POA: Diagnosis not present

## 2017-04-18 DIAGNOSIS — R2689 Other abnormalities of gait and mobility: Secondary | ICD-10-CM | POA: Diagnosis not present

## 2017-04-18 DIAGNOSIS — R262 Difficulty in walking, not elsewhere classified: Secondary | ICD-10-CM | POA: Diagnosis not present

## 2017-04-18 DIAGNOSIS — Z95810 Presence of automatic (implantable) cardiac defibrillator: Secondary | ICD-10-CM | POA: Diagnosis not present

## 2017-04-18 DIAGNOSIS — M25561 Pain in right knee: Secondary | ICD-10-CM | POA: Diagnosis not present

## 2017-04-18 DIAGNOSIS — E1122 Type 2 diabetes mellitus with diabetic chronic kidney disease: Secondary | ICD-10-CM | POA: Diagnosis not present

## 2017-04-18 DIAGNOSIS — R1312 Dysphagia, oropharyngeal phase: Secondary | ICD-10-CM | POA: Diagnosis not present

## 2017-04-18 DIAGNOSIS — M6281 Muscle weakness (generalized): Secondary | ICD-10-CM | POA: Diagnosis not present

## 2017-04-18 DIAGNOSIS — N183 Chronic kidney disease, stage 3 (moderate): Secondary | ICD-10-CM | POA: Diagnosis not present

## 2017-04-18 DIAGNOSIS — I2510 Atherosclerotic heart disease of native coronary artery without angina pectoris: Secondary | ICD-10-CM | POA: Diagnosis not present

## 2017-04-18 DIAGNOSIS — I5022 Chronic systolic (congestive) heart failure: Secondary | ICD-10-CM | POA: Diagnosis not present

## 2017-04-18 DIAGNOSIS — M1380 Other specified arthritis, unspecified site: Secondary | ICD-10-CM | POA: Diagnosis not present

## 2017-04-18 DIAGNOSIS — D649 Anemia, unspecified: Secondary | ICD-10-CM | POA: Diagnosis not present

## 2017-04-19 DIAGNOSIS — R262 Difficulty in walking, not elsewhere classified: Secondary | ICD-10-CM | POA: Diagnosis not present

## 2017-04-19 DIAGNOSIS — E1122 Type 2 diabetes mellitus with diabetic chronic kidney disease: Secondary | ICD-10-CM | POA: Diagnosis not present

## 2017-04-19 DIAGNOSIS — I2510 Atherosclerotic heart disease of native coronary artery without angina pectoris: Secondary | ICD-10-CM | POA: Diagnosis not present

## 2017-04-19 DIAGNOSIS — I5022 Chronic systolic (congestive) heart failure: Secondary | ICD-10-CM | POA: Diagnosis not present

## 2017-04-19 DIAGNOSIS — R2689 Other abnormalities of gait and mobility: Secondary | ICD-10-CM | POA: Diagnosis not present

## 2017-04-19 DIAGNOSIS — Z95810 Presence of automatic (implantable) cardiac defibrillator: Secondary | ICD-10-CM | POA: Diagnosis not present

## 2017-04-19 DIAGNOSIS — R1312 Dysphagia, oropharyngeal phase: Secondary | ICD-10-CM | POA: Diagnosis not present

## 2017-04-19 DIAGNOSIS — M25561 Pain in right knee: Secondary | ICD-10-CM | POA: Diagnosis not present

## 2017-04-19 DIAGNOSIS — M6281 Muscle weakness (generalized): Secondary | ICD-10-CM | POA: Diagnosis not present

## 2017-04-19 DIAGNOSIS — M1380 Other specified arthritis, unspecified site: Secondary | ICD-10-CM | POA: Diagnosis not present

## 2017-04-19 DIAGNOSIS — N183 Chronic kidney disease, stage 3 (moderate): Secondary | ICD-10-CM | POA: Diagnosis not present

## 2017-04-19 DIAGNOSIS — D649 Anemia, unspecified: Secondary | ICD-10-CM | POA: Diagnosis not present

## 2017-04-20 DIAGNOSIS — M1380 Other specified arthritis, unspecified site: Secondary | ICD-10-CM | POA: Diagnosis not present

## 2017-04-20 DIAGNOSIS — E1122 Type 2 diabetes mellitus with diabetic chronic kidney disease: Secondary | ICD-10-CM | POA: Diagnosis not present

## 2017-04-20 DIAGNOSIS — N183 Chronic kidney disease, stage 3 (moderate): Secondary | ICD-10-CM | POA: Diagnosis not present

## 2017-04-20 DIAGNOSIS — Z95810 Presence of automatic (implantable) cardiac defibrillator: Secondary | ICD-10-CM | POA: Diagnosis not present

## 2017-04-20 DIAGNOSIS — M25561 Pain in right knee: Secondary | ICD-10-CM | POA: Diagnosis not present

## 2017-04-20 DIAGNOSIS — I5022 Chronic systolic (congestive) heart failure: Secondary | ICD-10-CM | POA: Diagnosis not present

## 2017-04-20 DIAGNOSIS — R2689 Other abnormalities of gait and mobility: Secondary | ICD-10-CM | POA: Diagnosis not present

## 2017-04-20 DIAGNOSIS — R1312 Dysphagia, oropharyngeal phase: Secondary | ICD-10-CM | POA: Diagnosis not present

## 2017-04-20 DIAGNOSIS — I2510 Atherosclerotic heart disease of native coronary artery without angina pectoris: Secondary | ICD-10-CM | POA: Diagnosis not present

## 2017-04-20 DIAGNOSIS — R262 Difficulty in walking, not elsewhere classified: Secondary | ICD-10-CM | POA: Diagnosis not present

## 2017-04-20 DIAGNOSIS — D649 Anemia, unspecified: Secondary | ICD-10-CM | POA: Diagnosis not present

## 2017-04-20 DIAGNOSIS — M6281 Muscle weakness (generalized): Secondary | ICD-10-CM | POA: Diagnosis not present

## 2017-04-21 DIAGNOSIS — N183 Chronic kidney disease, stage 3 (moderate): Secondary | ICD-10-CM | POA: Diagnosis not present

## 2017-04-21 DIAGNOSIS — M1380 Other specified arthritis, unspecified site: Secondary | ICD-10-CM | POA: Diagnosis not present

## 2017-04-21 DIAGNOSIS — M6281 Muscle weakness (generalized): Secondary | ICD-10-CM | POA: Diagnosis not present

## 2017-04-21 DIAGNOSIS — R2689 Other abnormalities of gait and mobility: Secondary | ICD-10-CM | POA: Diagnosis not present

## 2017-04-21 DIAGNOSIS — D649 Anemia, unspecified: Secondary | ICD-10-CM | POA: Diagnosis not present

## 2017-04-21 DIAGNOSIS — I2510 Atherosclerotic heart disease of native coronary artery without angina pectoris: Secondary | ICD-10-CM | POA: Diagnosis not present

## 2017-04-21 DIAGNOSIS — I5022 Chronic systolic (congestive) heart failure: Secondary | ICD-10-CM | POA: Diagnosis not present

## 2017-04-21 DIAGNOSIS — E1122 Type 2 diabetes mellitus with diabetic chronic kidney disease: Secondary | ICD-10-CM | POA: Diagnosis not present

## 2017-04-21 DIAGNOSIS — Z95810 Presence of automatic (implantable) cardiac defibrillator: Secondary | ICD-10-CM | POA: Diagnosis not present

## 2017-04-21 DIAGNOSIS — R262 Difficulty in walking, not elsewhere classified: Secondary | ICD-10-CM | POA: Diagnosis not present

## 2017-04-21 DIAGNOSIS — M25561 Pain in right knee: Secondary | ICD-10-CM | POA: Diagnosis not present

## 2017-04-21 DIAGNOSIS — R1312 Dysphagia, oropharyngeal phase: Secondary | ICD-10-CM | POA: Diagnosis not present

## 2017-04-24 DIAGNOSIS — M25561 Pain in right knee: Secondary | ICD-10-CM | POA: Diagnosis not present

## 2017-04-24 DIAGNOSIS — R2689 Other abnormalities of gait and mobility: Secondary | ICD-10-CM | POA: Diagnosis not present

## 2017-04-24 DIAGNOSIS — R1312 Dysphagia, oropharyngeal phase: Secondary | ICD-10-CM | POA: Diagnosis not present

## 2017-04-24 DIAGNOSIS — M6281 Muscle weakness (generalized): Secondary | ICD-10-CM | POA: Diagnosis not present

## 2017-04-24 DIAGNOSIS — M1380 Other specified arthritis, unspecified site: Secondary | ICD-10-CM | POA: Diagnosis not present

## 2017-04-24 DIAGNOSIS — D649 Anemia, unspecified: Secondary | ICD-10-CM | POA: Diagnosis not present

## 2017-04-24 DIAGNOSIS — R262 Difficulty in walking, not elsewhere classified: Secondary | ICD-10-CM | POA: Diagnosis not present

## 2017-04-24 DIAGNOSIS — Z95810 Presence of automatic (implantable) cardiac defibrillator: Secondary | ICD-10-CM | POA: Diagnosis not present

## 2017-04-24 DIAGNOSIS — I2510 Atherosclerotic heart disease of native coronary artery without angina pectoris: Secondary | ICD-10-CM | POA: Diagnosis not present

## 2017-04-24 DIAGNOSIS — N183 Chronic kidney disease, stage 3 (moderate): Secondary | ICD-10-CM | POA: Diagnosis not present

## 2017-04-24 DIAGNOSIS — I5022 Chronic systolic (congestive) heart failure: Secondary | ICD-10-CM | POA: Diagnosis not present

## 2017-04-24 DIAGNOSIS — E1122 Type 2 diabetes mellitus with diabetic chronic kidney disease: Secondary | ICD-10-CM | POA: Diagnosis not present

## 2017-04-25 DIAGNOSIS — R1312 Dysphagia, oropharyngeal phase: Secondary | ICD-10-CM | POA: Diagnosis not present

## 2017-04-25 DIAGNOSIS — Z95810 Presence of automatic (implantable) cardiac defibrillator: Secondary | ICD-10-CM | POA: Diagnosis not present

## 2017-04-25 DIAGNOSIS — M6281 Muscle weakness (generalized): Secondary | ICD-10-CM | POA: Diagnosis not present

## 2017-04-25 DIAGNOSIS — D649 Anemia, unspecified: Secondary | ICD-10-CM | POA: Diagnosis not present

## 2017-04-25 DIAGNOSIS — I2510 Atherosclerotic heart disease of native coronary artery without angina pectoris: Secondary | ICD-10-CM | POA: Diagnosis not present

## 2017-04-25 DIAGNOSIS — M25561 Pain in right knee: Secondary | ICD-10-CM | POA: Diagnosis not present

## 2017-04-25 DIAGNOSIS — I5022 Chronic systolic (congestive) heart failure: Secondary | ICD-10-CM | POA: Diagnosis not present

## 2017-04-25 DIAGNOSIS — R2689 Other abnormalities of gait and mobility: Secondary | ICD-10-CM | POA: Diagnosis not present

## 2017-04-25 DIAGNOSIS — R262 Difficulty in walking, not elsewhere classified: Secondary | ICD-10-CM | POA: Diagnosis not present

## 2017-04-25 DIAGNOSIS — E1122 Type 2 diabetes mellitus with diabetic chronic kidney disease: Secondary | ICD-10-CM | POA: Diagnosis not present

## 2017-04-25 DIAGNOSIS — M1380 Other specified arthritis, unspecified site: Secondary | ICD-10-CM | POA: Diagnosis not present

## 2017-04-25 DIAGNOSIS — N183 Chronic kidney disease, stage 3 (moderate): Secondary | ICD-10-CM | POA: Diagnosis not present

## 2017-04-26 DIAGNOSIS — M1380 Other specified arthritis, unspecified site: Secondary | ICD-10-CM | POA: Diagnosis not present

## 2017-04-26 DIAGNOSIS — I2510 Atherosclerotic heart disease of native coronary artery without angina pectoris: Secondary | ICD-10-CM | POA: Diagnosis not present

## 2017-04-26 DIAGNOSIS — N183 Chronic kidney disease, stage 3 (moderate): Secondary | ICD-10-CM | POA: Diagnosis not present

## 2017-04-26 DIAGNOSIS — E1122 Type 2 diabetes mellitus with diabetic chronic kidney disease: Secondary | ICD-10-CM | POA: Diagnosis not present

## 2017-04-26 DIAGNOSIS — R2689 Other abnormalities of gait and mobility: Secondary | ICD-10-CM | POA: Diagnosis not present

## 2017-04-26 DIAGNOSIS — M25561 Pain in right knee: Secondary | ICD-10-CM | POA: Diagnosis not present

## 2017-04-26 DIAGNOSIS — D649 Anemia, unspecified: Secondary | ICD-10-CM | POA: Diagnosis not present

## 2017-04-26 DIAGNOSIS — I5022 Chronic systolic (congestive) heart failure: Secondary | ICD-10-CM | POA: Diagnosis not present

## 2017-04-26 DIAGNOSIS — M6281 Muscle weakness (generalized): Secondary | ICD-10-CM | POA: Diagnosis not present

## 2017-04-26 DIAGNOSIS — Z95810 Presence of automatic (implantable) cardiac defibrillator: Secondary | ICD-10-CM | POA: Diagnosis not present

## 2017-04-26 DIAGNOSIS — R1312 Dysphagia, oropharyngeal phase: Secondary | ICD-10-CM | POA: Diagnosis not present

## 2017-04-26 DIAGNOSIS — R262 Difficulty in walking, not elsewhere classified: Secondary | ICD-10-CM | POA: Diagnosis not present

## 2017-04-27 DIAGNOSIS — I639 Cerebral infarction, unspecified: Secondary | ICD-10-CM | POA: Diagnosis not present

## 2017-04-27 DIAGNOSIS — E1122 Type 2 diabetes mellitus with diabetic chronic kidney disease: Secondary | ICD-10-CM | POA: Diagnosis not present

## 2017-04-27 DIAGNOSIS — R262 Difficulty in walking, not elsewhere classified: Secondary | ICD-10-CM | POA: Diagnosis not present

## 2017-04-27 DIAGNOSIS — I4892 Unspecified atrial flutter: Secondary | ICD-10-CM | POA: Diagnosis not present

## 2017-04-27 DIAGNOSIS — M6281 Muscle weakness (generalized): Secondary | ICD-10-CM | POA: Diagnosis not present

## 2017-04-27 DIAGNOSIS — I5022 Chronic systolic (congestive) heart failure: Secondary | ICD-10-CM | POA: Diagnosis not present

## 2017-04-27 DIAGNOSIS — R1312 Dysphagia, oropharyngeal phase: Secondary | ICD-10-CM | POA: Diagnosis not present

## 2017-04-27 DIAGNOSIS — Z95810 Presence of automatic (implantable) cardiac defibrillator: Secondary | ICD-10-CM | POA: Diagnosis not present

## 2017-04-27 DIAGNOSIS — N183 Chronic kidney disease, stage 3 (moderate): Secondary | ICD-10-CM | POA: Diagnosis not present

## 2017-04-27 DIAGNOSIS — M109 Gout, unspecified: Secondary | ICD-10-CM | POA: Diagnosis not present

## 2017-04-27 DIAGNOSIS — D649 Anemia, unspecified: Secondary | ICD-10-CM | POA: Diagnosis not present

## 2017-04-27 DIAGNOSIS — R2689 Other abnormalities of gait and mobility: Secondary | ICD-10-CM | POA: Diagnosis not present

## 2017-04-27 DIAGNOSIS — I2510 Atherosclerotic heart disease of native coronary artery without angina pectoris: Secondary | ICD-10-CM | POA: Diagnosis not present

## 2017-04-27 DIAGNOSIS — M1380 Other specified arthritis, unspecified site: Secondary | ICD-10-CM | POA: Diagnosis not present

## 2017-04-27 DIAGNOSIS — M25561 Pain in right knee: Secondary | ICD-10-CM | POA: Diagnosis not present

## 2017-04-28 DIAGNOSIS — D649 Anemia, unspecified: Secondary | ICD-10-CM | POA: Diagnosis not present

## 2017-04-28 DIAGNOSIS — E1122 Type 2 diabetes mellitus with diabetic chronic kidney disease: Secondary | ICD-10-CM | POA: Diagnosis not present

## 2017-04-28 DIAGNOSIS — I5022 Chronic systolic (congestive) heart failure: Secondary | ICD-10-CM | POA: Diagnosis not present

## 2017-04-28 DIAGNOSIS — M6281 Muscle weakness (generalized): Secondary | ICD-10-CM | POA: Diagnosis not present

## 2017-04-28 DIAGNOSIS — N183 Chronic kidney disease, stage 3 (moderate): Secondary | ICD-10-CM | POA: Diagnosis not present

## 2017-04-28 DIAGNOSIS — R1312 Dysphagia, oropharyngeal phase: Secondary | ICD-10-CM | POA: Diagnosis not present

## 2017-04-28 DIAGNOSIS — R2689 Other abnormalities of gait and mobility: Secondary | ICD-10-CM | POA: Diagnosis not present

## 2017-04-28 DIAGNOSIS — R262 Difficulty in walking, not elsewhere classified: Secondary | ICD-10-CM | POA: Diagnosis not present

## 2017-04-28 DIAGNOSIS — M25561 Pain in right knee: Secondary | ICD-10-CM | POA: Diagnosis not present

## 2017-04-28 DIAGNOSIS — I2510 Atherosclerotic heart disease of native coronary artery without angina pectoris: Secondary | ICD-10-CM | POA: Diagnosis not present

## 2017-04-28 DIAGNOSIS — Z95810 Presence of automatic (implantable) cardiac defibrillator: Secondary | ICD-10-CM | POA: Diagnosis not present

## 2017-04-28 DIAGNOSIS — M1380 Other specified arthritis, unspecified site: Secondary | ICD-10-CM | POA: Diagnosis not present

## 2017-05-01 DIAGNOSIS — E1122 Type 2 diabetes mellitus with diabetic chronic kidney disease: Secondary | ICD-10-CM | POA: Diagnosis not present

## 2017-05-01 DIAGNOSIS — R262 Difficulty in walking, not elsewhere classified: Secondary | ICD-10-CM | POA: Diagnosis not present

## 2017-05-01 DIAGNOSIS — I5022 Chronic systolic (congestive) heart failure: Secondary | ICD-10-CM | POA: Diagnosis not present

## 2017-05-01 DIAGNOSIS — N183 Chronic kidney disease, stage 3 (moderate): Secondary | ICD-10-CM | POA: Diagnosis not present

## 2017-05-01 DIAGNOSIS — I2510 Atherosclerotic heart disease of native coronary artery without angina pectoris: Secondary | ICD-10-CM | POA: Diagnosis not present

## 2017-05-01 DIAGNOSIS — D649 Anemia, unspecified: Secondary | ICD-10-CM | POA: Diagnosis not present

## 2017-05-01 DIAGNOSIS — R2689 Other abnormalities of gait and mobility: Secondary | ICD-10-CM | POA: Diagnosis not present

## 2017-05-01 DIAGNOSIS — Z95810 Presence of automatic (implantable) cardiac defibrillator: Secondary | ICD-10-CM | POA: Diagnosis not present

## 2017-05-01 DIAGNOSIS — M6281 Muscle weakness (generalized): Secondary | ICD-10-CM | POA: Diagnosis not present

## 2017-05-01 DIAGNOSIS — R1312 Dysphagia, oropharyngeal phase: Secondary | ICD-10-CM | POA: Diagnosis not present

## 2017-05-01 DIAGNOSIS — M1380 Other specified arthritis, unspecified site: Secondary | ICD-10-CM | POA: Diagnosis not present

## 2017-05-01 DIAGNOSIS — M25561 Pain in right knee: Secondary | ICD-10-CM | POA: Diagnosis not present

## 2017-05-02 DIAGNOSIS — M6281 Muscle weakness (generalized): Secondary | ICD-10-CM | POA: Diagnosis not present

## 2017-05-02 DIAGNOSIS — D649 Anemia, unspecified: Secondary | ICD-10-CM | POA: Diagnosis not present

## 2017-05-02 DIAGNOSIS — Z95810 Presence of automatic (implantable) cardiac defibrillator: Secondary | ICD-10-CM | POA: Diagnosis not present

## 2017-05-02 DIAGNOSIS — N183 Chronic kidney disease, stage 3 (moderate): Secondary | ICD-10-CM | POA: Diagnosis not present

## 2017-05-02 DIAGNOSIS — I2510 Atherosclerotic heart disease of native coronary artery without angina pectoris: Secondary | ICD-10-CM | POA: Diagnosis not present

## 2017-05-02 DIAGNOSIS — I5022 Chronic systolic (congestive) heart failure: Secondary | ICD-10-CM | POA: Diagnosis not present

## 2017-05-02 DIAGNOSIS — E1122 Type 2 diabetes mellitus with diabetic chronic kidney disease: Secondary | ICD-10-CM | POA: Diagnosis not present

## 2017-05-02 DIAGNOSIS — R262 Difficulty in walking, not elsewhere classified: Secondary | ICD-10-CM | POA: Diagnosis not present

## 2017-05-02 DIAGNOSIS — R1312 Dysphagia, oropharyngeal phase: Secondary | ICD-10-CM | POA: Diagnosis not present

## 2017-05-02 DIAGNOSIS — R2689 Other abnormalities of gait and mobility: Secondary | ICD-10-CM | POA: Diagnosis not present

## 2017-05-02 DIAGNOSIS — M25561 Pain in right knee: Secondary | ICD-10-CM | POA: Diagnosis not present

## 2017-05-02 DIAGNOSIS — M1380 Other specified arthritis, unspecified site: Secondary | ICD-10-CM | POA: Diagnosis not present

## 2017-05-26 DIAGNOSIS — L603 Nail dystrophy: Secondary | ICD-10-CM | POA: Diagnosis not present

## 2017-05-26 DIAGNOSIS — B351 Tinea unguium: Secondary | ICD-10-CM | POA: Diagnosis not present

## 2017-05-26 DIAGNOSIS — E1159 Type 2 diabetes mellitus with other circulatory complications: Secondary | ICD-10-CM | POA: Diagnosis not present

## 2017-05-26 DIAGNOSIS — Q845 Enlarged and hypertrophic nails: Secondary | ICD-10-CM | POA: Diagnosis not present

## 2017-06-02 DIAGNOSIS — I2510 Atherosclerotic heart disease of native coronary artery without angina pectoris: Secondary | ICD-10-CM | POA: Diagnosis not present

## 2017-06-02 DIAGNOSIS — I69359 Hemiplegia and hemiparesis following cerebral infarction affecting unspecified side: Secondary | ICD-10-CM | POA: Diagnosis not present

## 2017-06-02 DIAGNOSIS — Z95810 Presence of automatic (implantable) cardiac defibrillator: Secondary | ICD-10-CM | POA: Diagnosis not present

## 2017-06-02 DIAGNOSIS — E46 Unspecified protein-calorie malnutrition: Secondary | ICD-10-CM | POA: Diagnosis not present

## 2017-07-03 DIAGNOSIS — I5022 Chronic systolic (congestive) heart failure: Secondary | ICD-10-CM | POA: Diagnosis not present

## 2017-07-03 DIAGNOSIS — I639 Cerebral infarction, unspecified: Secondary | ICD-10-CM | POA: Diagnosis not present

## 2017-07-31 DIAGNOSIS — G301 Alzheimer's disease with late onset: Secondary | ICD-10-CM | POA: Diagnosis not present

## 2017-08-03 DIAGNOSIS — I639 Cerebral infarction, unspecified: Secondary | ICD-10-CM | POA: Diagnosis not present

## 2017-08-03 DIAGNOSIS — I2510 Atherosclerotic heart disease of native coronary artery without angina pectoris: Secondary | ICD-10-CM | POA: Diagnosis not present

## 2017-08-03 DIAGNOSIS — M150 Primary generalized (osteo)arthritis: Secondary | ICD-10-CM | POA: Diagnosis not present

## 2017-08-03 DIAGNOSIS — I5022 Chronic systolic (congestive) heart failure: Secondary | ICD-10-CM | POA: Diagnosis not present

## 2017-08-04 DIAGNOSIS — I2510 Atherosclerotic heart disease of native coronary artery without angina pectoris: Secondary | ICD-10-CM | POA: Diagnosis not present

## 2017-08-04 DIAGNOSIS — I69359 Hemiplegia and hemiparesis following cerebral infarction affecting unspecified side: Secondary | ICD-10-CM | POA: Diagnosis not present

## 2017-08-04 DIAGNOSIS — E1122 Type 2 diabetes mellitus with diabetic chronic kidney disease: Secondary | ICD-10-CM | POA: Diagnosis not present

## 2017-08-04 DIAGNOSIS — N183 Chronic kidney disease, stage 3 (moderate): Secondary | ICD-10-CM | POA: Diagnosis not present

## 2017-08-15 ENCOUNTER — Other Ambulatory Visit: Payer: Self-pay

## 2017-08-15 NOTE — Patient Outreach (Signed)
[facility]) Care Management  08/15/2017  ARTIS SOUTHAM May 31, 1940 [id number]    Medication Adherence call to Mr.Peggye Form patient is showing past due under [facility] Ins.on Rosuvastatin 10 mg and Metformin 1000 mg spoke with patients father he said he is at a long term facility at this time and they provide all his medication he also said patient has dementia.   Lillia Abed CPhT Pharmacy Technician [facility] Care Management Direct Dial [phone]  Fax [phone] [email or web address]

## 2017-08-23 DIAGNOSIS — G301 Alzheimer's disease with late onset: Secondary | ICD-10-CM | POA: Diagnosis not present

## 2017-08-30 DIAGNOSIS — D649 Anemia, unspecified: Secondary | ICD-10-CM | POA: Diagnosis not present

## 2017-08-30 DIAGNOSIS — E119 Type 2 diabetes mellitus without complications: Secondary | ICD-10-CM | POA: Diagnosis not present

## 2017-08-30 DIAGNOSIS — E785 Hyperlipidemia, unspecified: Secondary | ICD-10-CM | POA: Diagnosis not present

## 2017-09-05 DIAGNOSIS — M6281 Muscle weakness (generalized): Secondary | ICD-10-CM | POA: Diagnosis not present

## 2017-09-05 DIAGNOSIS — M25561 Pain in right knee: Secondary | ICD-10-CM | POA: Diagnosis not present

## 2017-09-05 DIAGNOSIS — D649 Anemia, unspecified: Secondary | ICD-10-CM | POA: Diagnosis not present

## 2017-09-05 DIAGNOSIS — R1312 Dysphagia, oropharyngeal phase: Secondary | ICD-10-CM | POA: Diagnosis not present

## 2017-09-05 DIAGNOSIS — R2689 Other abnormalities of gait and mobility: Secondary | ICD-10-CM | POA: Diagnosis not present

## 2017-09-05 DIAGNOSIS — E1122 Type 2 diabetes mellitus with diabetic chronic kidney disease: Secondary | ICD-10-CM | POA: Diagnosis not present

## 2017-09-05 DIAGNOSIS — I5022 Chronic systolic (congestive) heart failure: Secondary | ICD-10-CM | POA: Diagnosis not present

## 2017-09-05 DIAGNOSIS — N183 Chronic kidney disease, stage 3 (moderate): Secondary | ICD-10-CM | POA: Diagnosis not present

## 2017-09-05 DIAGNOSIS — I2510 Atherosclerotic heart disease of native coronary artery without angina pectoris: Secondary | ICD-10-CM | POA: Diagnosis not present

## 2017-09-05 DIAGNOSIS — Z95810 Presence of automatic (implantable) cardiac defibrillator: Secondary | ICD-10-CM | POA: Diagnosis not present

## 2017-09-05 DIAGNOSIS — M1380 Other specified arthritis, unspecified site: Secondary | ICD-10-CM | POA: Diagnosis not present

## 2017-09-05 DIAGNOSIS — R262 Difficulty in walking, not elsewhere classified: Secondary | ICD-10-CM | POA: Diagnosis not present

## 2017-09-06 DIAGNOSIS — R2689 Other abnormalities of gait and mobility: Secondary | ICD-10-CM | POA: Diagnosis not present

## 2017-09-06 DIAGNOSIS — N183 Chronic kidney disease, stage 3 (moderate): Secondary | ICD-10-CM | POA: Diagnosis not present

## 2017-09-06 DIAGNOSIS — R262 Difficulty in walking, not elsewhere classified: Secondary | ICD-10-CM | POA: Diagnosis not present

## 2017-09-06 DIAGNOSIS — M25561 Pain in right knee: Secondary | ICD-10-CM | POA: Diagnosis not present

## 2017-09-06 DIAGNOSIS — I2510 Atherosclerotic heart disease of native coronary artery without angina pectoris: Secondary | ICD-10-CM | POA: Diagnosis not present

## 2017-09-06 DIAGNOSIS — G301 Alzheimer's disease with late onset: Secondary | ICD-10-CM | POA: Diagnosis not present

## 2017-09-06 DIAGNOSIS — M1380 Other specified arthritis, unspecified site: Secondary | ICD-10-CM | POA: Diagnosis not present

## 2017-09-06 DIAGNOSIS — Z95810 Presence of automatic (implantable) cardiac defibrillator: Secondary | ICD-10-CM | POA: Diagnosis not present

## 2017-09-06 DIAGNOSIS — M6281 Muscle weakness (generalized): Secondary | ICD-10-CM | POA: Diagnosis not present

## 2017-09-06 DIAGNOSIS — D649 Anemia, unspecified: Secondary | ICD-10-CM | POA: Diagnosis not present

## 2017-09-06 DIAGNOSIS — E1122 Type 2 diabetes mellitus with diabetic chronic kidney disease: Secondary | ICD-10-CM | POA: Diagnosis not present

## 2017-09-06 DIAGNOSIS — I5022 Chronic systolic (congestive) heart failure: Secondary | ICD-10-CM | POA: Diagnosis not present

## 2017-09-06 DIAGNOSIS — R1312 Dysphagia, oropharyngeal phase: Secondary | ICD-10-CM | POA: Diagnosis not present

## 2017-09-07 DIAGNOSIS — R1312 Dysphagia, oropharyngeal phase: Secondary | ICD-10-CM | POA: Diagnosis not present

## 2017-09-07 DIAGNOSIS — M6281 Muscle weakness (generalized): Secondary | ICD-10-CM | POA: Diagnosis not present

## 2017-09-07 DIAGNOSIS — Z95810 Presence of automatic (implantable) cardiac defibrillator: Secondary | ICD-10-CM | POA: Diagnosis not present

## 2017-09-07 DIAGNOSIS — E1122 Type 2 diabetes mellitus with diabetic chronic kidney disease: Secondary | ICD-10-CM | POA: Diagnosis not present

## 2017-09-07 DIAGNOSIS — I2510 Atherosclerotic heart disease of native coronary artery without angina pectoris: Secondary | ICD-10-CM | POA: Diagnosis not present

## 2017-09-07 DIAGNOSIS — D649 Anemia, unspecified: Secondary | ICD-10-CM | POA: Diagnosis not present

## 2017-09-07 DIAGNOSIS — E119 Type 2 diabetes mellitus without complications: Secondary | ICD-10-CM | POA: Diagnosis not present

## 2017-09-07 DIAGNOSIS — M1380 Other specified arthritis, unspecified site: Secondary | ICD-10-CM | POA: Diagnosis not present

## 2017-09-07 DIAGNOSIS — I5022 Chronic systolic (congestive) heart failure: Secondary | ICD-10-CM | POA: Diagnosis not present

## 2017-09-07 DIAGNOSIS — R262 Difficulty in walking, not elsewhere classified: Secondary | ICD-10-CM | POA: Diagnosis not present

## 2017-09-07 DIAGNOSIS — I10 Essential (primary) hypertension: Secondary | ICD-10-CM | POA: Diagnosis not present

## 2017-09-07 DIAGNOSIS — E039 Hypothyroidism, unspecified: Secondary | ICD-10-CM | POA: Diagnosis not present

## 2017-09-07 DIAGNOSIS — M25561 Pain in right knee: Secondary | ICD-10-CM | POA: Diagnosis not present

## 2017-09-07 DIAGNOSIS — R569 Unspecified convulsions: Secondary | ICD-10-CM | POA: Diagnosis not present

## 2017-09-07 DIAGNOSIS — R2689 Other abnormalities of gait and mobility: Secondary | ICD-10-CM | POA: Diagnosis not present

## 2017-09-07 DIAGNOSIS — N183 Chronic kidney disease, stage 3 (moderate): Secondary | ICD-10-CM | POA: Diagnosis not present

## 2017-09-08 DIAGNOSIS — R2689 Other abnormalities of gait and mobility: Secondary | ICD-10-CM | POA: Diagnosis not present

## 2017-09-08 DIAGNOSIS — E1122 Type 2 diabetes mellitus with diabetic chronic kidney disease: Secondary | ICD-10-CM | POA: Diagnosis not present

## 2017-09-08 DIAGNOSIS — N183 Chronic kidney disease, stage 3 (moderate): Secondary | ICD-10-CM | POA: Diagnosis not present

## 2017-09-08 DIAGNOSIS — I2510 Atherosclerotic heart disease of native coronary artery without angina pectoris: Secondary | ICD-10-CM | POA: Diagnosis not present

## 2017-09-08 DIAGNOSIS — M1380 Other specified arthritis, unspecified site: Secondary | ICD-10-CM | POA: Diagnosis not present

## 2017-09-08 DIAGNOSIS — D649 Anemia, unspecified: Secondary | ICD-10-CM | POA: Diagnosis not present

## 2017-09-08 DIAGNOSIS — R262 Difficulty in walking, not elsewhere classified: Secondary | ICD-10-CM | POA: Diagnosis not present

## 2017-09-08 DIAGNOSIS — Z95810 Presence of automatic (implantable) cardiac defibrillator: Secondary | ICD-10-CM | POA: Diagnosis not present

## 2017-09-08 DIAGNOSIS — I5022 Chronic systolic (congestive) heart failure: Secondary | ICD-10-CM | POA: Diagnosis not present

## 2017-09-08 DIAGNOSIS — M25561 Pain in right knee: Secondary | ICD-10-CM | POA: Diagnosis not present

## 2017-09-08 DIAGNOSIS — R1312 Dysphagia, oropharyngeal phase: Secondary | ICD-10-CM | POA: Diagnosis not present

## 2017-09-08 DIAGNOSIS — M6281 Muscle weakness (generalized): Secondary | ICD-10-CM | POA: Diagnosis not present

## 2017-09-10 DIAGNOSIS — R262 Difficulty in walking, not elsewhere classified: Secondary | ICD-10-CM | POA: Diagnosis not present

## 2017-09-10 DIAGNOSIS — R1312 Dysphagia, oropharyngeal phase: Secondary | ICD-10-CM | POA: Diagnosis not present

## 2017-09-10 DIAGNOSIS — I2510 Atherosclerotic heart disease of native coronary artery without angina pectoris: Secondary | ICD-10-CM | POA: Diagnosis not present

## 2017-09-10 DIAGNOSIS — I5022 Chronic systolic (congestive) heart failure: Secondary | ICD-10-CM | POA: Diagnosis not present

## 2017-09-10 DIAGNOSIS — D649 Anemia, unspecified: Secondary | ICD-10-CM | POA: Diagnosis not present

## 2017-09-10 DIAGNOSIS — R2689 Other abnormalities of gait and mobility: Secondary | ICD-10-CM | POA: Diagnosis not present

## 2017-09-10 DIAGNOSIS — N183 Chronic kidney disease, stage 3 (moderate): Secondary | ICD-10-CM | POA: Diagnosis not present

## 2017-09-10 DIAGNOSIS — M25561 Pain in right knee: Secondary | ICD-10-CM | POA: Diagnosis not present

## 2017-09-10 DIAGNOSIS — E1122 Type 2 diabetes mellitus with diabetic chronic kidney disease: Secondary | ICD-10-CM | POA: Diagnosis not present

## 2017-09-10 DIAGNOSIS — Z95810 Presence of automatic (implantable) cardiac defibrillator: Secondary | ICD-10-CM | POA: Diagnosis not present

## 2017-09-10 DIAGNOSIS — M6281 Muscle weakness (generalized): Secondary | ICD-10-CM | POA: Diagnosis not present

## 2017-09-10 DIAGNOSIS — M1380 Other specified arthritis, unspecified site: Secondary | ICD-10-CM | POA: Diagnosis not present

## 2017-09-12 DIAGNOSIS — Z95810 Presence of automatic (implantable) cardiac defibrillator: Secondary | ICD-10-CM | POA: Diagnosis not present

## 2017-09-12 DIAGNOSIS — R2689 Other abnormalities of gait and mobility: Secondary | ICD-10-CM | POA: Diagnosis not present

## 2017-09-12 DIAGNOSIS — M1380 Other specified arthritis, unspecified site: Secondary | ICD-10-CM | POA: Diagnosis not present

## 2017-09-12 DIAGNOSIS — R262 Difficulty in walking, not elsewhere classified: Secondary | ICD-10-CM | POA: Diagnosis not present

## 2017-09-12 DIAGNOSIS — M25561 Pain in right knee: Secondary | ICD-10-CM | POA: Diagnosis not present

## 2017-09-12 DIAGNOSIS — E1122 Type 2 diabetes mellitus with diabetic chronic kidney disease: Secondary | ICD-10-CM | POA: Diagnosis not present

## 2017-09-12 DIAGNOSIS — N183 Chronic kidney disease, stage 3 (moderate): Secondary | ICD-10-CM | POA: Diagnosis not present

## 2017-09-12 DIAGNOSIS — I2510 Atherosclerotic heart disease of native coronary artery without angina pectoris: Secondary | ICD-10-CM | POA: Diagnosis not present

## 2017-09-12 DIAGNOSIS — I5022 Chronic systolic (congestive) heart failure: Secondary | ICD-10-CM | POA: Diagnosis not present

## 2017-09-12 DIAGNOSIS — M6281 Muscle weakness (generalized): Secondary | ICD-10-CM | POA: Diagnosis not present

## 2017-09-12 DIAGNOSIS — D649 Anemia, unspecified: Secondary | ICD-10-CM | POA: Diagnosis not present

## 2017-09-12 DIAGNOSIS — R1312 Dysphagia, oropharyngeal phase: Secondary | ICD-10-CM | POA: Diagnosis not present

## 2017-09-13 DIAGNOSIS — R2689 Other abnormalities of gait and mobility: Secondary | ICD-10-CM | POA: Diagnosis not present

## 2017-09-13 DIAGNOSIS — R262 Difficulty in walking, not elsewhere classified: Secondary | ICD-10-CM | POA: Diagnosis not present

## 2017-09-13 DIAGNOSIS — Z95810 Presence of automatic (implantable) cardiac defibrillator: Secondary | ICD-10-CM | POA: Diagnosis not present

## 2017-09-13 DIAGNOSIS — I2510 Atherosclerotic heart disease of native coronary artery without angina pectoris: Secondary | ICD-10-CM | POA: Diagnosis not present

## 2017-09-13 DIAGNOSIS — M1380 Other specified arthritis, unspecified site: Secondary | ICD-10-CM | POA: Diagnosis not present

## 2017-09-13 DIAGNOSIS — E1122 Type 2 diabetes mellitus with diabetic chronic kidney disease: Secondary | ICD-10-CM | POA: Diagnosis not present

## 2017-09-13 DIAGNOSIS — N183 Chronic kidney disease, stage 3 (moderate): Secondary | ICD-10-CM | POA: Diagnosis not present

## 2017-09-13 DIAGNOSIS — M25561 Pain in right knee: Secondary | ICD-10-CM | POA: Diagnosis not present

## 2017-09-13 DIAGNOSIS — M6281 Muscle weakness (generalized): Secondary | ICD-10-CM | POA: Diagnosis not present

## 2017-09-13 DIAGNOSIS — I5022 Chronic systolic (congestive) heart failure: Secondary | ICD-10-CM | POA: Diagnosis not present

## 2017-09-13 DIAGNOSIS — R1312 Dysphagia, oropharyngeal phase: Secondary | ICD-10-CM | POA: Diagnosis not present

## 2017-09-13 DIAGNOSIS — D649 Anemia, unspecified: Secondary | ICD-10-CM | POA: Diagnosis not present

## 2017-09-14 DIAGNOSIS — I5022 Chronic systolic (congestive) heart failure: Secondary | ICD-10-CM | POA: Diagnosis not present

## 2017-09-14 DIAGNOSIS — E1122 Type 2 diabetes mellitus with diabetic chronic kidney disease: Secondary | ICD-10-CM | POA: Diagnosis not present

## 2017-09-14 DIAGNOSIS — N183 Chronic kidney disease, stage 3 (moderate): Secondary | ICD-10-CM | POA: Diagnosis not present

## 2017-09-14 DIAGNOSIS — Z95810 Presence of automatic (implantable) cardiac defibrillator: Secondary | ICD-10-CM | POA: Diagnosis not present

## 2017-09-14 DIAGNOSIS — M1380 Other specified arthritis, unspecified site: Secondary | ICD-10-CM | POA: Diagnosis not present

## 2017-09-14 DIAGNOSIS — I2510 Atherosclerotic heart disease of native coronary artery without angina pectoris: Secondary | ICD-10-CM | POA: Diagnosis not present

## 2017-09-14 DIAGNOSIS — R2689 Other abnormalities of gait and mobility: Secondary | ICD-10-CM | POA: Diagnosis not present

## 2017-09-14 DIAGNOSIS — D649 Anemia, unspecified: Secondary | ICD-10-CM | POA: Diagnosis not present

## 2017-09-14 DIAGNOSIS — R1312 Dysphagia, oropharyngeal phase: Secondary | ICD-10-CM | POA: Diagnosis not present

## 2017-09-14 DIAGNOSIS — R262 Difficulty in walking, not elsewhere classified: Secondary | ICD-10-CM | POA: Diagnosis not present

## 2017-09-14 DIAGNOSIS — M6281 Muscle weakness (generalized): Secondary | ICD-10-CM | POA: Diagnosis not present

## 2017-09-14 DIAGNOSIS — M25561 Pain in right knee: Secondary | ICD-10-CM | POA: Diagnosis not present

## 2017-09-20 DIAGNOSIS — G301 Alzheimer's disease with late onset: Secondary | ICD-10-CM | POA: Diagnosis not present

## 2017-09-27 DIAGNOSIS — G301 Alzheimer's disease with late onset: Secondary | ICD-10-CM | POA: Diagnosis not present

## 2017-10-03 ENCOUNTER — Inpatient Hospital Stay (HOSPITAL_COMMUNITY)
Admission: EM | Admit: 2017-10-03 | Discharge: 2017-10-05 | DRG: 871 | Disposition: A | Discharge status: Skilled Nursing Facility | Payer: Medicare Other | Attending: Internal Medicine | Admitting: Internal Medicine

## 2017-10-03 ENCOUNTER — Emergency Department (HOSPITAL_COMMUNITY): Payer: Medicare Other

## 2017-10-03 DIAGNOSIS — I2510 Atherosclerotic heart disease of native coronary artery without angina pectoris: Secondary | ICD-10-CM | POA: Diagnosis not present

## 2017-10-03 DIAGNOSIS — Z95810 Presence of automatic (implantable) cardiac defibrillator: Secondary | ICD-10-CM | POA: Diagnosis not present

## 2017-10-03 DIAGNOSIS — Z961 Presence of intraocular lens: Secondary | ICD-10-CM | POA: Diagnosis present

## 2017-10-03 DIAGNOSIS — I639 Cerebral infarction, unspecified: Secondary | ICD-10-CM | POA: Diagnosis not present

## 2017-10-03 DIAGNOSIS — I255 Ischemic cardiomyopathy: Secondary | ICD-10-CM | POA: Diagnosis present

## 2017-10-03 DIAGNOSIS — J690 Pneumonitis due to inhalation of food and vomit: Secondary | ICD-10-CM | POA: Diagnosis not present

## 2017-10-03 DIAGNOSIS — I4892 Unspecified atrial flutter: Secondary | ICD-10-CM | POA: Diagnosis present

## 2017-10-03 DIAGNOSIS — Z833 Family history of diabetes mellitus: Secondary | ICD-10-CM

## 2017-10-03 DIAGNOSIS — R509 Fever, unspecified: Secondary | ICD-10-CM | POA: Diagnosis not present

## 2017-10-03 DIAGNOSIS — J9621 Acute and chronic respiratory failure with hypoxia: Secondary | ICD-10-CM | POA: Diagnosis not present

## 2017-10-03 DIAGNOSIS — E872 Acidosis: Secondary | ICD-10-CM | POA: Diagnosis present

## 2017-10-03 DIAGNOSIS — N179 Acute kidney failure, unspecified: Secondary | ICD-10-CM | POA: Diagnosis present

## 2017-10-03 DIAGNOSIS — Z888 Allergy status to other drugs, medicaments and biological substances status: Secondary | ICD-10-CM | POA: Diagnosis not present

## 2017-10-03 DIAGNOSIS — E1141 Type 2 diabetes mellitus with diabetic mononeuropathy: Secondary | ICD-10-CM | POA: Diagnosis not present

## 2017-10-03 DIAGNOSIS — J9601 Acute respiratory failure with hypoxia: Secondary | ICD-10-CM | POA: Diagnosis present

## 2017-10-03 DIAGNOSIS — I5022 Chronic systolic (congestive) heart failure: Secondary | ICD-10-CM | POA: Diagnosis not present

## 2017-10-03 DIAGNOSIS — N183 Chronic kidney disease, stage 3 unspecified: Secondary | ICD-10-CM | POA: Diagnosis present

## 2017-10-03 DIAGNOSIS — Z9842 Cataract extraction status, left eye: Secondary | ICD-10-CM

## 2017-10-03 DIAGNOSIS — J9690 Respiratory failure, unspecified, unspecified whether with hypoxia or hypercapnia: Secondary | ICD-10-CM | POA: Diagnosis present

## 2017-10-03 DIAGNOSIS — I472 Ventricular tachycardia: Secondary | ICD-10-CM | POA: Diagnosis not present

## 2017-10-03 DIAGNOSIS — H547 Unspecified visual loss: Secondary | ICD-10-CM | POA: Diagnosis present

## 2017-10-03 DIAGNOSIS — Z515 Encounter for palliative care: Secondary | ICD-10-CM

## 2017-10-03 DIAGNOSIS — E119 Type 2 diabetes mellitus without complications: Secondary | ICD-10-CM | POA: Diagnosis not present

## 2017-10-03 DIAGNOSIS — A419 Sepsis, unspecified organism: Principal | ICD-10-CM | POA: Diagnosis present

## 2017-10-03 DIAGNOSIS — Z981 Arthrodesis status: Secondary | ICD-10-CM

## 2017-10-03 DIAGNOSIS — Z794 Long term (current) use of insulin: Secondary | ICD-10-CM

## 2017-10-03 DIAGNOSIS — Z66 Do not resuscitate: Secondary | ICD-10-CM | POA: Diagnosis not present

## 2017-10-03 DIAGNOSIS — G9340 Encephalopathy, unspecified: Secondary | ICD-10-CM | POA: Diagnosis present

## 2017-10-03 DIAGNOSIS — E039 Hypothyroidism, unspecified: Secondary | ICD-10-CM | POA: Diagnosis not present

## 2017-10-03 DIAGNOSIS — F0281 Dementia in other diseases classified elsewhere with behavioral disturbance: Secondary | ICD-10-CM | POA: Diagnosis not present

## 2017-10-03 DIAGNOSIS — E559 Vitamin D deficiency, unspecified: Secondary | ICD-10-CM | POA: Diagnosis not present

## 2017-10-03 DIAGNOSIS — E1122 Type 2 diabetes mellitus with diabetic chronic kidney disease: Secondary | ICD-10-CM | POA: Diagnosis not present

## 2017-10-03 DIAGNOSIS — D649 Anemia, unspecified: Secondary | ICD-10-CM | POA: Diagnosis present

## 2017-10-03 DIAGNOSIS — R918 Other nonspecific abnormal finding of lung field: Secondary | ICD-10-CM | POA: Diagnosis not present

## 2017-10-03 DIAGNOSIS — F03918 Unspecified dementia, unspecified severity, with other behavioral disturbance: Secondary | ICD-10-CM | POA: Diagnosis present

## 2017-10-03 DIAGNOSIS — I493 Ventricular premature depolarization: Secondary | ICD-10-CM | POA: Diagnosis not present

## 2017-10-03 DIAGNOSIS — J9811 Atelectasis: Secondary | ICD-10-CM | POA: Diagnosis present

## 2017-10-03 DIAGNOSIS — Z7984 Long term (current) use of oral hypoglycemic drugs: Secondary | ICD-10-CM

## 2017-10-03 DIAGNOSIS — R296 Repeated falls: Secondary | ICD-10-CM | POA: Diagnosis present

## 2017-10-03 DIAGNOSIS — I10 Essential (primary) hypertension: Secondary | ICD-10-CM | POA: Diagnosis present

## 2017-10-03 DIAGNOSIS — Z7401 Bed confinement status: Secondary | ICD-10-CM

## 2017-10-03 DIAGNOSIS — Z9841 Cataract extraction status, right eye: Secondary | ICD-10-CM

## 2017-10-03 DIAGNOSIS — Z8701 Personal history of pneumonia (recurrent): Secondary | ICD-10-CM

## 2017-10-03 DIAGNOSIS — I872 Venous insufficiency (chronic) (peripheral): Secondary | ICD-10-CM | POA: Diagnosis present

## 2017-10-03 DIAGNOSIS — Z8673 Personal history of transient ischemic attack (TIA), and cerebral infarction without residual deficits: Secondary | ICD-10-CM

## 2017-10-03 DIAGNOSIS — Z7901 Long term (current) use of anticoagulants: Secondary | ICD-10-CM

## 2017-10-03 DIAGNOSIS — R404 Transient alteration of awareness: Secondary | ICD-10-CM | POA: Diagnosis not present

## 2017-10-03 DIAGNOSIS — R609 Edema, unspecified: Secondary | ICD-10-CM | POA: Diagnosis not present

## 2017-10-03 DIAGNOSIS — F329 Major depressive disorder, single episode, unspecified: Secondary | ICD-10-CM | POA: Diagnosis present

## 2017-10-03 DIAGNOSIS — Z86718 Personal history of other venous thrombosis and embolism: Secondary | ICD-10-CM

## 2017-10-03 DIAGNOSIS — R231 Pallor: Secondary | ICD-10-CM | POA: Diagnosis not present

## 2017-10-03 DIAGNOSIS — E785 Hyperlipidemia, unspecified: Secondary | ICD-10-CM | POA: Diagnosis present

## 2017-10-03 DIAGNOSIS — Z950 Presence of cardiac pacemaker: Secondary | ICD-10-CM

## 2017-10-03 DIAGNOSIS — R6520 Severe sepsis without septic shock: Secondary | ICD-10-CM | POA: Diagnosis not present

## 2017-10-03 DIAGNOSIS — I420 Dilated cardiomyopathy: Secondary | ICD-10-CM | POA: Diagnosis present

## 2017-10-03 DIAGNOSIS — G8250 Quadriplegia, unspecified: Secondary | ICD-10-CM | POA: Diagnosis present

## 2017-10-03 DIAGNOSIS — Z8249 Family history of ischemic heart disease and other diseases of the circulatory system: Secondary | ICD-10-CM

## 2017-10-03 DIAGNOSIS — I959 Hypotension, unspecified: Secondary | ICD-10-CM | POA: Diagnosis not present

## 2017-10-03 DIAGNOSIS — Z801 Family history of malignant neoplasm of trachea, bronchus and lung: Secondary | ICD-10-CM

## 2017-10-03 DIAGNOSIS — F0391 Unspecified dementia with behavioral disturbance: Secondary | ICD-10-CM

## 2017-10-03 DIAGNOSIS — M1990 Unspecified osteoarthritis, unspecified site: Secondary | ICD-10-CM | POA: Diagnosis present

## 2017-10-03 DIAGNOSIS — I482 Chronic atrial fibrillation: Secondary | ICD-10-CM | POA: Diagnosis present

## 2017-10-03 DIAGNOSIS — I252 Old myocardial infarction: Secondary | ICD-10-CM

## 2017-10-03 DIAGNOSIS — R0603 Acute respiratory distress: Secondary | ICD-10-CM | POA: Diagnosis not present

## 2017-10-03 DIAGNOSIS — Z8241 Family history of sudden cardiac death: Secondary | ICD-10-CM

## 2017-10-03 DIAGNOSIS — R05 Cough: Secondary | ICD-10-CM | POA: Diagnosis not present

## 2017-10-03 DIAGNOSIS — Z79891 Long term (current) use of opiate analgesic: Secondary | ICD-10-CM

## 2017-10-03 DIAGNOSIS — Z79899 Other long term (current) drug therapy: Secondary | ICD-10-CM

## 2017-10-03 DIAGNOSIS — K219 Gastro-esophageal reflux disease without esophagitis: Secondary | ICD-10-CM | POA: Diagnosis present

## 2017-10-03 DIAGNOSIS — I130 Hypertensive heart and chronic kidney disease with heart failure and stage 1 through stage 4 chronic kidney disease, or unspecified chronic kidney disease: Secondary | ICD-10-CM | POA: Diagnosis present

## 2017-10-03 DIAGNOSIS — J189 Pneumonia, unspecified organism: Secondary | ICD-10-CM | POA: Diagnosis not present

## 2017-10-03 DIAGNOSIS — Z96641 Presence of right artificial hip joint: Secondary | ICD-10-CM | POA: Diagnosis present

## 2017-10-03 LAB — CBC WITH DIFFERENTIAL/PLATELET
Basophils Absolute: 0 10*3/uL (ref 0.0–0.1)
Basophils Relative: 0 %
EOS PCT: 0 %
Eosinophils Absolute: 0 10*3/uL (ref 0.0–0.7)
HEMATOCRIT: 23.8 % — AB (ref 39.0–52.0)
HEMOGLOBIN: 7.5 g/dL — AB (ref 13.0–17.0)
LYMPHS PCT: 12 %
Lymphs Abs: 0.5 10*3/uL — ABNORMAL LOW (ref 0.7–4.0)
MCH: 30.7 pg (ref 26.0–34.0)
MCHC: 31.5 g/dL (ref 30.0–36.0)
MCV: 97.5 fL (ref 78.0–100.0)
MONOS PCT: 11 %
Monocytes Absolute: 0.4 10*3/uL (ref 0.1–1.0)
NEUTROS ABS: 2.9 10*3/uL (ref 1.7–7.7)
NEUTROS PCT: 77 %
Platelets: 126 10*3/uL — ABNORMAL LOW (ref 150–400)
RBC: 2.44 MIL/uL — ABNORMAL LOW (ref 4.22–5.81)
RDW: 16.9 % — ABNORMAL HIGH (ref 11.5–15.5)
WBC MORPHOLOGY: INCREASED
WBC: 3.8 10*3/uL — ABNORMAL LOW (ref 4.0–10.5)

## 2017-10-03 LAB — COMPREHENSIVE METABOLIC PANEL
ALK PHOS: 28 U/L — AB (ref 38–126)
ALT: 12 U/L (ref 0–44)
ANION GAP: 10 (ref 5–15)
AST: 10 U/L — ABNORMAL LOW (ref 15–41)
Albumin: 2.8 g/dL — ABNORMAL LOW (ref 3.5–5.0)
BILIRUBIN TOTAL: 0.7 mg/dL (ref 0.3–1.2)
BUN: 53 mg/dL — ABNORMAL HIGH (ref 8–23)
CO2: 27 mmol/L (ref 22–32)
Calcium: 9.2 mg/dL (ref 8.9–10.3)
Chloride: 107 mmol/L (ref 98–111)
Creatinine, Ser: 1.82 mg/dL — ABNORMAL HIGH (ref 0.61–1.24)
GFR calc non Af Amer: 34 mL/min — ABNORMAL LOW (ref >60)
GFR, EST AFRICAN AMERICAN: 40 mL/min — AB (ref >60)
GLUCOSE: 222 mg/dL — AB (ref 70–99)
Potassium: 4.7 mmol/L (ref 3.5–5.1)
SODIUM: 144 mmol/L (ref 135–145)
Total Protein: 5.7 g/dL — ABNORMAL LOW (ref 6.5–8.1)

## 2017-10-03 LAB — I-STAT CG4 LACTIC ACID, ED: Lactic Acid, Venous: 2.76 mmol/L (ref 0.5–1.9)

## 2017-10-03 LAB — POC OCCULT BLOOD, ED: FECAL OCCULT BLD: NEGATIVE

## 2017-10-03 MED ORDER — SODIUM CHLORIDE 0.9 % IV BOLUS (SEPSIS)
250.0000 mL | Freq: Once | INTRAVENOUS | Status: AC
  Administered 2017-10-03: 250 mL via INTRAVENOUS

## 2017-10-03 MED ORDER — PIPERACILLIN-TAZOBACTAM 3.375 G IVPB 30 MIN
3.3750 g | Freq: Once | INTRAVENOUS | Status: AC
  Administered 2017-10-03: 3.375 g via INTRAVENOUS
  Filled 2017-10-03: qty 50

## 2017-10-03 MED ORDER — SODIUM CHLORIDE 0.9 % IV BOLUS (SEPSIS)
1000.0000 mL | Freq: Once | INTRAVENOUS | Status: AC
  Administered 2017-10-03: 1000 mL via INTRAVENOUS

## 2017-10-03 MED ORDER — VANCOMYCIN HCL IN DEXTROSE 1-5 GM/200ML-% IV SOLN
1000.0000 mg | Freq: Once | INTRAVENOUS | Status: AC
  Administered 2017-10-03: 1000 mg via INTRAVENOUS
  Filled 2017-10-03: qty 200

## 2017-10-03 NOTE — ED Triage Notes (Signed)
Patient arrived via EMS, from [facility], patient last known normal noon today, per ems aspirated at lunch and was supposed to be transported for an xray, patient unresponsive when ems arrived, blood pressure dropped en route  to 70/30 and temp 103 axillary. EMS tx: Epi drip 36micrograms/min.

## 2017-10-03 NOTE — ED Provider Notes (Addendum)
[facility] EMERGENCY DEPARTMENT Provider Note   CSN: [id number] Arrival date & time: 10/03/17  09-08-27     History   Chief Complaint Chief Complaint  Patient presents with  . Code Sepsis    HPI [name] is a 77 y.o. male.  HPI   Patient presents from nursing facility with concern of altered mental status, hypotension. The patient himself is listless, minimally responsive, level 5 caveat secondary to acuity of condition. EMS notes the patient Was hypotensive on arrival, with a systolic pressure of 70 started on an epinephrine drip in route.  His blood pressure subsequently improved. The patient himself cannot answer any questions, is listless. Patient has a most form, with DNI specified.  Past Medical History:  Diagnosis Date  . Acute encephalopathy   . AICD (automatic cardioverter/defibrillator) present   . Anemia   . Arthritis    "all over" (05/16/2013)  . Atrial flutter (HCC)   . CAD (coronary artery disease)   . Cardiomyopathy (HCC)    a. 10/2012 Echo: EF 20-25%, diff HK, mod dil LA/RV/RA.  Marland Kitchen Cervical myelopathy (HCC)   . Cervical spinal stenosis   . Chronic systolic congestive heart failure (HCC)   . CKD (chronic kidney disease), stage III   . 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 CVA (cerebral vascular accident) (HCC)   . Decreased vision of right eye   . Dementia   . Depression    "son died in service in Sep 08, 1990; still bothers me" (05/16/2013)  . DVT (deep venous thrombosis) (HCC) 10/2011   RUE; pt denies this hx on 05/16/2013  . Dysrhythmia   . Frequent falls   . GERD (gastroesophageal reflux disease)   . H/O hiatal hernia   . Hyperlipidemia   . Hypertension   . Hypoglycemia   . Lumbar spinal stenosis   . Mononeuritis of unspecified site   . Myocardial infarction (HCC)   . Pneumonia 1960   , also 2014  . Quadriparesis (HCC) 2011/09/08   pre op  .  Shortness of breath dyspnea    with exertion  . Type II diabetes mellitus (HCC)    type 2  . Venous insufficiency     Patient Active Problem List   Diagnosis Date Noted  . Dementia with behavioral disturbance 03/29/2016  . CKD (chronic kidney disease) stage 3, GFR 30-59 ml/min (HCC) 03/28/2016  . Diabetes (HCC) 03/27/2016  . AKI (acute kidney injury) (HCC) 03/27/2016  . Adjustment disorder with depressed mood 12/25/2015  . Memory deficits 12/01/2015  . Encephalopathy, metabolic 11/28/2015  . Acute left PCA stroke (HCC) 11/25/2015  . Wound dehiscence 03/03/2015  . Lumbar stenosis with neurogenic claudication 02/02/2015  . Cervical spondylosis with myelopathy 04/17/2014  . ICD (implantable cardioverter-defibrillator), biventricular, in situ 11/19/2013  . Cardiac resynchronization therapy defibrillator (CRT-D) in place 07/03/2013  . Chronic renal failure 01/24/2013  . CAD (coronary artery disease) 12/10/2012  . Chronic systolic heart failure (HCC) 11/20/2012  . Cardiomyopathy, ischemic 11/20/2012  . Atrial flutter (HCC) 11/08/2012  . Dilated cardiomyopathy (HCC) 11/01/2012  . Cervical stenosis of spine 10/28/2011  . Spondylosis of cervical joint 10/28/2011  . Cervical myelopathy (HCC) 10/28/2011  . Quadriparesis (HCC) 10/28/2011  . DVT of right proximal brachial through the distal axillary vein, acute 10/06/2011  . Constipation due to pain medication 10/06/2011  . Venous insufficiency 10/05/2011  . Complex regional pain syndrome of right  upper extremity secondary to severe cervical spinal stenosis 10/05/2011  . Hyperlipidemia 10/05/2011  . Normocytic anemia 10/03/2011  . Falls frequently 10/03/2011  . Diabetes mellitus with chronic kidney disease (HCC) 10/03/2011  . HTN (hypertension) 10/03/2011  . Gait abnormality secondary to lumbar spinal stenosis 10/03/2011  . Arthritis 10/03/2011    Past Surgical History:  Procedure Laterality Date  . ANTERIOR CERVICAL  DECOMP/DISCECTOMY FUSION  10/31/2011   Procedure: ANTERIOR CERVICAL DECOMPRESSION/DISCECTOMY FUSION 2 LEVELS;  Surgeon: Cristi Loron, MD;  Location: [facility];  Service: Neurosurgery;  Laterality: N/A;  Cervical Four-Five Cervical Five-Six Anterior Cervical Decompression with fusion interbody prothesis plating and bonegraft  . ANTERIOR CERVICAL DECOMP/DISCECTOMY FUSION N/A 04/17/2014   Procedure: CERVICAL THREE TO FOUR ANTERIOR CERVICAL DECOMPRESSION/DISCECTOMY FUSION 1 LEVEL/HARDWARE REMOVAL;  Surgeon: Tressie Stalker, MD;  Location: [facility];  Service: Neurosurgery;  Laterality: N/A;  C34 anterior cervical decompression with fusion interbody prosthesis plating and bonegraft with exploration of prev fusion and possible removal of old hardware  . BI-VENTRICULAR IMPLANTABLE CARDIOVERTER DEFIBRILLATOR N/A 05/16/2013   Procedure: BI-VENTRICULAR IMPLANTABLE CARDIOVERTER DEFIBRILLATOR  (CRT-D);  Surgeon: Marinus Maw, MD;  Location: [facility] CATH LAB;  Service: Cardiovascular;  Laterality: N/A;  . BI-VENTRICULAR IMPLANTABLE CARDIOVERTER DEFIBRILLATOR  (CRT-D) Left 05/16/2013   STJ CRTD implanted by Dr Ladona Ridgel for primary prevention/CHF  . CARDIAC CATHETERIZATION  10/2012  . CARPAL TUNNEL RELEASE Right ~ 2013  . CATARACT EXTRACTION W/ INTRAOCULAR LENS IMPLANT Bilateral   . JOINT REPLACEMENT  2013  . LEFT AND RIGHT HEART CATHETERIZATION WITH CORONARY ANGIOGRAM N/A 11/06/2012   Procedure: LEFT AND RIGHT HEART CATHETERIZATION WITH CORONARY ANGIOGRAM;  Surgeon: Laurey Morale, MD;  Location: [facility] CATH LAB;  Service: Cardiovascular;  Laterality: N/A;  . LUMBAR LAMINECTOMY/DECOMPRESSION MICRODISCECTOMY N/A 02/02/2015   Procedure: LUMBAR LAMINECTOMY/DECOMPRESSION MICRODISCECTOMY 2 LEVELS;  Surgeon: Tressie Stalker, MD;  Location: [facility];  Service: Neurosurgery;  Laterality: N/A;  L34 L45 laminectomy and foraminotomy  . LUMBAR WOUND DEBRIDEMENT N/A 03/04/2015   Procedure: Incision and Drainage of LUMBAR  WOUND ;  Surgeon: Tressie Stalker, MD;  Location: [facility];  Service: Neurosurgery;  Laterality: N/A;  . 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 toe removal  Left    3rd and 4th toe removed  . TOTAL HIP ARTHROPLASTY Right ~ 2012        Home Medications    Prior to Admission medications   Medication Sig Start Date End Date Taking? Authorizing Provider  Calcium Carbonate Antacid (TUMS PO) Take 1 tablet by mouth 2 (two) times daily as needed (indigestion).    [provider]  carvedilol (COREG) 6.25 MG tablet Take 1 tablet (6.25 mg total) by mouth 2 (two) times daily. 09/05/16   Marinus Maw, MD  divalproex (DEPAKOTE) 125 MG DR tablet Take 125 mg by mouth 2 (two) times daily.    [provider]  escitalopram (LEXAPRO) 10 MG tablet Take 1 tablet (10 mg total) by mouth daily. 11/17/16   Laveda Abbe, NP  fluticasone [facility]) 50 MCG/ACT nasal spray Place 2 sprays into both nostrils daily as needed for allergies. 12/16/15   Sharon Seller, NP  gabapentin (NEURONTIN) 100 MG capsule Take 100 mg by mouth 2 (two) times daily.    [provider]  gabapentin (NEURONTIN) 100 MG capsule Take 2 capsules (200 mg total) by mouth 2 (two) times daily. 11/16/16   Laveda Abbe, NP  hydrALAZINE (APRESOLINE) 25 MG tablet  TAKE ONE-HALF TABLET BY MOUTH THREE TIMES DAILY Patient taking differently: Take 12.5 mg by mouth 3 (three) times daily.  12/16/15   Sharon Seller, NP  ipratropium (ATROVENT) 0.06 % nasal spray Place 2 sprays into both nostrils 4 (four) times daily.    [provider]  isosorbide mononitrate (IMDUR) 30 MG 24 hr tablet Take 1 tablet (30 mg total) by mouth daily. 12/16/15   Sharon Seller, NP  ivabradine (CORLANOR) 5 MG TABS tablet Take 1 tablet (5 mg total) by mouth 2 (two) times daily with a meal. 12/16/15   Sharon Seller, NP  magnesium hydroxide (MILK OF MAGNESIA) 400 MG/5ML suspension Take 30 mLs by  mouth daily as needed. Patient taking differently: Take 30 mLs by mouth daily as needed for mild constipation.  12/16/15   Sharon Seller, NP  menthol-zinc oxide (GOLD BOND) powder Apply 1 application topically 2 (two) times daily as needed (jock itch).    [provider]  metFORMIN (GLUCOPHAGE) 1000 MG tablet Take 1,000 mg by mouth 2 (two) times daily with a meal.    [provider]  pantoprazole (PROTONIX) 40 MG tablet Take 40 mg by mouth daily.    [provider]  QUEtiapine (SEROQUEL) 25 MG tablet Take 75 mg by mouth at bedtime.    [provider]  QUEtiapine (SEROQUEL) 25 MG tablet Take 3 tablets (75 mg total) by mouth at bedtime. 11/16/16   Laveda Abbe, NP  Rivaroxaban (XARELTO) 15 MG TABS tablet Take 1 tablet (15 mg total) by mouth daily with supper. 12/16/15   Sharon Seller, NP  rosuvastatin (CRESTOR) 10 MG tablet Take 1 tablet (10 mg total) by mouth daily at 6 PM. 12/16/15   Janyth Contes, Janene Harvey, NP  silver sulfADIAZINE (SILVADENE) 1 % cream Apply 1 application topically daily as needed (wound care).    [provider]  sodium chloride (DEEP SEA NASAL SPRAY) 0.65 % nasal spray Place 2 sprays into the nose every 2 (two) hours as needed for congestion. 12/16/15   Sharon Seller, NP  traMADol (ULTRAM) 50 MG tablet Take 50 mg by mouth every 8 (eight) hours as needed for moderate pain.    [provider]  traZODone (DESYREL) 50 MG tablet Take 1 tablet (50 mg total) by mouth at bedtime as needed for sleep. 12/25/15   Charm Rings, NP    Family History Family History  Problem Relation Age of Onset  . Diabetes Brother   . Cancer Father        Lung cancer  . Heart attack Mother        pt thinks she had MI  . Cancer Mother     Social History Social History   Tobacco Use  . Smoking status: Never Smoker  . Smokeless tobacco: Never Used  Substance Use Topics  . Alcohol use: No    Comment: 05/16/2013 "used to drink a  little bit a long time ago; nothing in over 30 yrs"  . Drug use: No     Allergies   Acyclovir and related; Dristan cold [chlorphen-pe-acetaminophen]; and Prednisone   Review of Systems Review of Systems  Unable to perform ROS: Acuity of condition     Physical Exam Updated Vital Signs There were no vitals taken for this visit.  Physical Exam  Constitutional: He appears listless. He appears ill.  HENT:  Head: Normocephalic and atraumatic.  Eyes: Conjunctivae and EOM are normal.  Cardiovascular: Normal rate and regular  rhythm.  Pulmonary/Chest: Effort normal. No stridor. No respiratory distress.  Abdominal: There is no guarding.  Genitourinary:  Genitourinary Comments: Patient with substantial amounts of stool on arrival  Musculoskeletal: He exhibits no deformity.  Neurological: He appears listless. He displays atrophy.  Patient does not follow commands reliably, does have some brief unintelligible verbal responses to questioning  Skin: There is pallor.  Psychiatric: Cognition and memory are impaired.  Nursing note and vitals reviewed.    ED Treatments / Results  Labs (all labs ordered are listed, but only abnormal results are displayed) Labs Reviewed  COMPREHENSIVE METABOLIC PANEL - Abnormal; Notable for the following components:      Result Value   Glucose, Bld 222 (*)    BUN 53 (*)    Creatinine, Ser 1.82 (*)    Total Protein 5.7 (*)    Albumin 2.8 (*)    AST 10 (*)    Alkaline Phosphatase 28 (*)    GFR calc non Af Amer 34 (*)    GFR calc Af Amer 40 (*)    All other components within normal limits  CBC WITH DIFFERENTIAL/PLATELET - Abnormal; Notable for the following components:   WBC 3.8 (*)    RBC 2.44 (*)    Hemoglobin 7.5 (*)    HCT 23.8 (*)    RDW 16.9 (*)    Platelets 126 (*)    Lymphs Abs 0.5 (*)    All other components within normal limits  I-STAT CG4 LACTIC ACID, ED - Abnormal; Notable for the following components:   Lactic Acid, Venous 2.76 (*)     All other components within normal limits  CULTURE, BLOOD (ROUTINE X 2)  CULTURE, BLOOD (ROUTINE X 2)  URINALYSIS, ROUTINE W REFLEX MICROSCOPIC  BRAIN NATRIURETIC PEPTIDE  OCCULT BLOOD X 1 CARD TO LAB, STOOL    EKG EKG Interpretation  Date/Time:  Tuesday October 03 2017 23:21:24 EDT Ventricular Rate:  60 PR Interval:    QRS Duration: 120 QT Interval:  483 QTC Calculation: 483 R Axis:   -46 Text Interpretation:  Atrial-ventricular dual-paced rhythm No further analysis attempted due to paced rhythm Abnormal ekg Confirmed by Gerhard Munch [phone]) on 10/03/2017 11:52:01 PM   Radiology Dg Chest Port 1 View  Result Date: 10/03/2017 CLINICAL DATA:  Possible aspiration EXAM: PORTABLE CHEST 1 VIEW COMPARISON:  12/03/2016, 11/14/2016 FINDINGS: Left-sided pacing device as before. Chronic elevation of left diaphragm. Mild increased airspace disease at the left base. Right hilar fullness suspected to be due to vascular crowding and low lung volume. Aortic atherosclerosis. No pneumothorax. IMPRESSION: Minimal opacity at the left lung base, favor atelectasis. Right hilar soft tissue fullness, likely due to vascular crowding and low lung volume. Electronically Signed   By: Jasmine Pang M.D.   On: 10/03/2017 23:09    Procedures Procedures (including critical care time)  Medications Ordered in ED Medications  sodium chloride 0.9 % bolus 1,000 mL (1,000 mLs Intravenous New Bag/Given 10/03/17 2306)    And  sodium chloride 0.9 % bolus 1,000 mL (1,000 mLs Intravenous New Bag/Given 10/03/17 2307)    And  sodium chloride 0.9 % bolus 250 mL (250 mLs Intravenous New Bag/Given 10/03/17 2314)  vancomycin (VANCOCIN) IVPB 1000 mg/200 mL premix (1,000 mg Intravenous New Bag/Given 10/03/17 2320)  piperacillin-tazobactam (ZOSYN) IVPB 3.375 g (3.375 g Intravenous New Bag/Given 10/03/17 2252)     Initial Impression / Assessment and Plan / ED Course  I have reviewed the triage vital signs and the nursing  notes.  Pertinent labs & imaging results that were available during my care of the patient were reviewed by me and considered in my medical decision making (see chart for details).    This elderly male presents from his nursing facility after an episode of possible aspiration, but with worsening mentation throughout the subsequent hours. Patient was found to be febrile, hypotensive on EMS arrival, brought for evaluation.  On here the patient was listless, and empiric antibiotics, vancomycin, Zosyn provided after the initial evaluation.  Initial x-ray with possible opacification in the left lung base, given the fever, decreased respiratory drive, patient will continue therapy for pneumonia empirically.  11:33 PM Blood pressure 115/80. Patient remains in similar condition clinically, listless.  Patient has received antibiotics, labs notable for worsening renal function, leukopenia, persistent anemia, lactic acidosis. Given the patient's fever, lactic acidosis, altered mental status, he will require admission for further evaluation and management per Patient is receiving appropriate therapy for attention, possible infection with fluids, antibiotics, and per his prior advanced directive, does not request intubation. He is also not a candidate for BiPAP.  Final Clinical Impressions(s) / ED Diagnoses  Sepsis  CRITICAL CARE Performed by: Gerhard Munch Total critical care time: 35 minutes Critical care time was exclusive of separately billable procedures and treating other patients. Critical care was necessary to treat or prevent imminent or life-threatening deterioration. Critical care was time spent personally by me on the following activities: development of treatment plan with patient and/or surrogate as well as nursing, discussions with consultants, evaluation of patient's response to treatment, examination of patient, obtaining history from patient or surrogate, ordering and performing  treatments and interventions, ordering and review of laboratory studies, ordering and review of radiographic studies, pulse oximetry and re-evaluation of patient's condition.   Gerhard Munch, MD 10/03/17 1610  Gerhard Munch, MD 10/03/17 2352

## 2017-10-04 ENCOUNTER — Inpatient Hospital Stay (HOSPITAL_COMMUNITY): Payer: Medicare Other

## 2017-10-04 DIAGNOSIS — G8250 Quadriplegia, unspecified: Secondary | ICD-10-CM | POA: Diagnosis not present

## 2017-10-04 DIAGNOSIS — Z794 Long term (current) use of insulin: Secondary | ICD-10-CM | POA: Diagnosis not present

## 2017-10-04 DIAGNOSIS — J9601 Acute respiratory failure with hypoxia: Secondary | ICD-10-CM | POA: Diagnosis not present

## 2017-10-04 DIAGNOSIS — F0391 Unspecified dementia with behavioral disturbance: Secondary | ICD-10-CM | POA: Diagnosis present

## 2017-10-04 DIAGNOSIS — J189 Pneumonia, unspecified organism: Secondary | ICD-10-CM

## 2017-10-04 DIAGNOSIS — D649 Anemia, unspecified: Secondary | ICD-10-CM | POA: Diagnosis present

## 2017-10-04 DIAGNOSIS — I5022 Chronic systolic (congestive) heart failure: Secondary | ICD-10-CM | POA: Diagnosis present

## 2017-10-04 DIAGNOSIS — E1122 Type 2 diabetes mellitus with diabetic chronic kidney disease: Secondary | ICD-10-CM | POA: Diagnosis present

## 2017-10-04 DIAGNOSIS — N179 Acute kidney failure, unspecified: Secondary | ICD-10-CM | POA: Diagnosis not present

## 2017-10-04 DIAGNOSIS — R6520 Severe sepsis without septic shock: Secondary | ICD-10-CM | POA: Diagnosis not present

## 2017-10-04 DIAGNOSIS — A419 Sepsis, unspecified organism: Principal | ICD-10-CM

## 2017-10-04 DIAGNOSIS — F0281 Dementia in other diseases classified elsewhere with behavioral disturbance: Secondary | ICD-10-CM | POA: Diagnosis not present

## 2017-10-04 DIAGNOSIS — I130 Hypertensive heart and chronic kidney disease with heart failure and stage 1 through stage 4 chronic kidney disease, or unspecified chronic kidney disease: Secondary | ICD-10-CM | POA: Diagnosis not present

## 2017-10-04 DIAGNOSIS — J690 Pneumonitis due to inhalation of food and vomit: Secondary | ICD-10-CM | POA: Diagnosis not present

## 2017-10-04 DIAGNOSIS — G9340 Encephalopathy, unspecified: Secondary | ICD-10-CM | POA: Diagnosis present

## 2017-10-04 DIAGNOSIS — Z888 Allergy status to other drugs, medicaments and biological substances status: Secondary | ICD-10-CM | POA: Diagnosis not present

## 2017-10-04 DIAGNOSIS — I2510 Atherosclerotic heart disease of native coronary artery without angina pectoris: Secondary | ICD-10-CM | POA: Diagnosis present

## 2017-10-04 DIAGNOSIS — I255 Ischemic cardiomyopathy: Secondary | ICD-10-CM | POA: Diagnosis not present

## 2017-10-04 DIAGNOSIS — Z515 Encounter for palliative care: Secondary | ICD-10-CM | POA: Diagnosis not present

## 2017-10-04 DIAGNOSIS — J9621 Acute and chronic respiratory failure with hypoxia: Secondary | ICD-10-CM

## 2017-10-04 DIAGNOSIS — E872 Acidosis: Secondary | ICD-10-CM | POA: Diagnosis present

## 2017-10-04 DIAGNOSIS — M2550 Pain in unspecified joint: Secondary | ICD-10-CM | POA: Diagnosis not present

## 2017-10-04 DIAGNOSIS — Z7401 Bed confinement status: Secondary | ICD-10-CM | POA: Diagnosis not present

## 2017-10-04 DIAGNOSIS — J9811 Atelectasis: Secondary | ICD-10-CM | POA: Diagnosis present

## 2017-10-04 DIAGNOSIS — J9690 Respiratory failure, unspecified, unspecified whether with hypoxia or hypercapnia: Secondary | ICD-10-CM | POA: Diagnosis present

## 2017-10-04 DIAGNOSIS — N183 Chronic kidney disease, stage 3 (moderate): Secondary | ICD-10-CM | POA: Diagnosis not present

## 2017-10-04 DIAGNOSIS — Z95810 Presence of automatic (implantable) cardiac defibrillator: Secondary | ICD-10-CM | POA: Diagnosis not present

## 2017-10-04 DIAGNOSIS — E785 Hyperlipidemia, unspecified: Secondary | ICD-10-CM | POA: Diagnosis present

## 2017-10-04 DIAGNOSIS — I4892 Unspecified atrial flutter: Secondary | ICD-10-CM | POA: Diagnosis present

## 2017-10-04 DIAGNOSIS — E1141 Type 2 diabetes mellitus with diabetic mononeuropathy: Secondary | ICD-10-CM | POA: Diagnosis present

## 2017-10-04 DIAGNOSIS — I420 Dilated cardiomyopathy: Secondary | ICD-10-CM | POA: Diagnosis present

## 2017-10-04 DIAGNOSIS — I472 Ventricular tachycardia: Secondary | ICD-10-CM | POA: Diagnosis not present

## 2017-10-04 LAB — URINALYSIS, MICROSCOPIC (REFLEX)

## 2017-10-04 LAB — URINALYSIS, ROUTINE W REFLEX MICROSCOPIC
Bilirubin Urine: NEGATIVE
GLUCOSE, UA: NEGATIVE mg/dL
Ketones, ur: NEGATIVE mg/dL
Leukocytes, UA: NEGATIVE
Nitrite: NEGATIVE
PROTEIN: 30 mg/dL — AB
Specific Gravity, Urine: 1.015 (ref 1.005–1.030)
pH: 5 (ref 5.0–8.0)

## 2017-10-04 LAB — GLUCOSE, CAPILLARY
GLUCOSE-CAPILLARY: 242 mg/dL — AB (ref 70–99)
GLUCOSE-CAPILLARY: 110 mg/dL — AB (ref 70–99)
Glucose-Capillary: 206 mg/dL — ABNORMAL HIGH (ref 70–99)
Glucose-Capillary: 167 mg/dL — ABNORMAL HIGH (ref 70–99)

## 2017-10-04 LAB — LACTIC ACID, PLASMA
LACTIC ACID, VENOUS: 6.3 mmol/L — AB (ref 0.5–1.9)
Lactic Acid, Venous: 4 mmol/L (ref 0.5–1.9)
Lactic Acid, Venous: 2.5 mmol/L (ref 0.5–1.9)
Lactic Acid, Venous: 2.4 mmol/L (ref 0.5–1.9)
Lactic Acid, Venous: 2.6 mmol/L (ref 0.5–1.9)

## 2017-10-04 LAB — BRAIN NATRIURETIC PEPTIDE: B Natriuretic Peptide: 721 pg/mL — ABNORMAL HIGH (ref 0.0–100.0)

## 2017-10-04 LAB — MAGNESIUM: MAGNESIUM: 1.2 mg/dL — AB (ref 1.7–2.4)

## 2017-10-04 LAB — POTASSIUM: POTASSIUM: 4.4 mmol/L (ref 3.5–5.1)

## 2017-10-04 MED ORDER — ALBUTEROL SULFATE (2.5 MG/3ML) 0.083% IN NEBU: 2.5000 mg | INHALATION_SOLUTION | RESPIRATORY_TRACT | Status: DC | PRN

## 2017-10-04 MED ORDER — ALBUTEROL SULFATE (2.5 MG/3ML) 0.083% IN NEBU
2.5000 mg | INHALATION_SOLUTION | Freq: Four times a day (QID) | RESPIRATORY_TRACT | Status: DC
  Administered 2017-10-04 (×2): 2.5 mg via RESPIRATORY_TRACT
  Filled 2017-10-04 (×2): qty 3

## 2017-10-04 MED ORDER — ONDANSETRON HCL 4 MG/2ML IJ SOLN: 4.0000 mg | Freq: Four times a day (QID) | INTRAMUSCULAR | Status: DC | PRN

## 2017-10-04 MED ORDER — SODIUM CHLORIDE 0.9 % IV SOLN
2.0000 g | INTRAVENOUS | Status: DC
  Administered 2017-10-04 – 2017-10-05 (×2): 2 g via INTRAVENOUS
  Filled 2017-10-04 (×2): qty 2

## 2017-10-04 MED ORDER — SODIUM CHLORIDE 0.9 % IV BOLUS
500.0000 mL | Freq: Once | INTRAVENOUS | Status: AC
  Administered 2017-10-04: 500 mL via INTRAVENOUS

## 2017-10-04 MED ORDER — INSULIN ASPART 100 UNIT/ML ~~LOC~~ SOLN
0.0000 [IU] | Freq: Three times a day (TID) | SUBCUTANEOUS | Status: DC
  Administered 2017-10-04 (×2): 3 [IU] via SUBCUTANEOUS
  Administered 2017-10-04: 2 [IU] via SUBCUTANEOUS
  Administered 2017-10-05: 1 [IU] via SUBCUTANEOUS

## 2017-10-04 MED ORDER — SODIUM CHLORIDE 0.9 % IV SOLN
INTRAVENOUS | Status: DC
  Administered 2017-10-04: 03:00:00 via INTRAVENOUS

## 2017-10-04 MED ORDER — DILTIAZEM HCL-DEXTROSE 100-5 MG/100ML-% IV SOLN (PREMIX)
5.0000 mg/h | INTRAVENOUS | Status: DC
  Administered 2017-10-04: 5 mg/h via INTRAVENOUS
  Filled 2017-10-04 (×2): qty 100

## 2017-10-04 MED ORDER — DILTIAZEM LOAD VIA INFUSION
5.0000 mg | Freq: Once | INTRAVENOUS | Status: AC
  Administered 2017-10-04: 5 mg via INTRAVENOUS
  Filled 2017-10-04: qty 5

## 2017-10-04 MED ORDER — VANCOMYCIN HCL IN DEXTROSE 1-5 GM/200ML-% IV SOLN
1000.0000 mg | INTRAVENOUS | Status: DC
  Administered 2017-10-05: 1000 mg via INTRAVENOUS
  Filled 2017-10-04: qty 200

## 2017-10-04 MED ORDER — INSULIN ASPART 100 UNIT/ML ~~LOC~~ SOLN: 0.0000 [IU] | Freq: Every day | SUBCUTANEOUS | Status: DC

## 2017-10-04 MED ORDER — ONDANSETRON HCL 4 MG PO TABS: 4.0000 mg | ORAL_TABLET | Freq: Four times a day (QID) | ORAL | Status: DC | PRN

## 2017-10-04 NOTE — Progress Notes (Signed)
CRITICAL VALUE ALERT  Critical Value:  Lactic Acid 6.3  Date & Time Notied:  10/04/2017 & 0423  Provider Notified: Craige Cotta, NP  Orders Received/Actions taken: 500 mL normal saline bolus ordered; Bolus started at 0430. Will continue to monitor pt.

## 2017-10-04 NOTE — Progress Notes (Signed)
[facility]                                                                              Patient Demographics  [name], is a 77 y.o. male, DOB - 11-16-1940, NWG:[id number]  Admit date - 10/03/2017   Admitting Physician Carron Curie, MD  Outpatient Primary MD for the patient is Mirna Mires, MD  Outpatient specialists:   LOS - 0  days   Medical records reviewed and are as summarized below:    Chief Complaint  Patient presents with  . Code Sepsis       Brief summary   Patient is a 77 year old male with history of chronic CHF, EF 20 to 25%, history of CVA, CKD stage III, dementia, presented with altered mental status, hypotension, hypoxemia following a choking episode in the nursing home.  Patient was started on IV antibiotics, IV fluids and placed on Ventimask.  Assessment & Plan    Principal problem Acute hypoxic respiratory failure possibly secondary to aspiration event -Per EMS, patient aspirated at lunch and was supposed to be transported to an x-ray when he became unresponsive, BP dropping to 70/30, fever 103.  Patient was placed on epinephrine drip in route to the ED -Currently nonverbal, unresponsive, lactic acid improving, still on NRB mask 15 L -Chest x-ray showed minimal opacity at the left lung base favor atelectasis, right hilar soft tissue fullness -For now continue IV vancomycin and cefepime, follow blood cultures -Overall guarded prognosis, palliative care consulted for goals of care   Active problems Acute encephalopathy superimposed on dementia -Appears to have history of dementia, unknown baseline -Obtain CT head for further work-up.  Unable to get MRI due to AICD -Continue to hold medications including tramadol, trazodone, Seroquel, Lexapro until patient is much more alert and awake and able to take medications  Sepsis with lactic acidosis -At the time of admission, per EMS, febrile, hypotensive .  Sepsis physiology  resolved -Continue IV fluid hydration  Acute kidney injury on CKD stage III -Creatinine 1.8 at the time of admission, baseline 1.2 in 8/18  Chronic anemia -FOBT negative  Type 2 diabetes mellitus, insulin-dependent -Currently altered mental status, continue only sliding scale insulin -Hold metformin   History of CAD, CHF, history of ischemic cardiomyopathy, with ICD -Prior echo in 2017, EF 50 to 55%, grade 1 diastolic dysfunction  - Currently holding medications including Coreg, Xarelto, ivabradine, Imdur, hydralazine due to altered mental status and unable to take p.o. meds   Hypertension -BP currently soft, continue IV fluid hydration, hold Imdur, hydralazine, Coreg  Hyperlipidemia -Currently n.p.o., continue to hold rosuvastatin  History of stroke in February 2018, thought to be embolic stroke -Currently holding Xarelto as patient is n.p.o., - Follow CT head  Code Status: Partial code DVT Prophylaxis:   SCD's Family Communication: No family member at the bedside   Disposition Plan:   Time Spent in minutes  35 minutes  Procedures:  None  Consultants:   None  Antimicrobials:   IV vancomycin 7/3  IV cefepime 7/3   Medications  Scheduled Meds: . albuterol  2.5 mg Nebulization Q6H  . insulin aspart  0-5  Units Subcutaneous QHS  . insulin aspart  0-9 Units Subcutaneous TID WC   Continuous Infusions: . sodium chloride 50 mL/hr at 10/04/17 0317  . ceFEPime (MAXIPIME) IV 2 g (10/04/17 0535)  . vancomycin     PRN Meds:.albuterol, ondansetron **OR** ondansetron (ZOFRAN) IV   Antibiotics   Anti-infectives (From admission, onward)   Start     Dose/Rate Route Frequency Ordered Stop   10/04/17 2200  vancomycin (VANCOCIN) IVPB 1000 mg/200 mL premix     1,000 mg 200 mL/hr over 60 Minutes Intravenous Every 24 hours 10/04/17 0247     10/04/17 0600  ceFEPIme (MAXIPIME) 2 g in sodium chloride 0.9 % 100 mL IVPB     2 g 200 mL/hr over 30 Minutes Intravenous Every  24 hours 10/04/17 0247     10/03/17 2245  piperacillin-tazobactam (ZOSYN) IVPB 3.375 g     3.375 g 100 mL/hr over 30 Minutes Intravenous  Once 10/03/17 2233 10/04/17 0113   10/03/17 2245  vancomycin (VANCOCIN) IVPB 1000 mg/200 mL premix     1,000 mg 200 mL/hr over 60 Minutes Intravenous  Once 10/03/17 2233 10/04/17 0113        Subjective:   [name] was seen and examined today.  Unable to obtain any review of system from the patient, on Ventimask 15 L.  Barely responsive, no fevers or chills, no family member at the bedside  Objective:   Vitals:   10/04/17 0232 10/04/17 0344 10/04/17 0732 10/04/17 0821  BP: (!) 136/56   (!) 113/52  Pulse: 83   67  Resp: (!) 23   (!) 22  Temp: 98.4 F (36.9 C)     TempSrc: Oral     SpO2: 93% 100% 100% 100%  Weight: 73.4 kg (161 lb 13.1 oz)       Intake/Output Summary (Last 24 hours) at 10/04/2017 1113 Last data filed at 10/04/2017 0700 Gross per 24 hour  Intake -  Output 450 ml  Net -450 ml     Wt Readings from Last 3 Encounters:  10/04/17 73.4 kg (161 lb 13.1 oz)  11/13/16 74.8 kg (165 lb)  10/14/16 74.4 kg (164 lb)     Exam  General: Barely responsive, does not follow commands, on Ventimask 15 L  Eyes: PERRLA, EOMI, Anicteric Sclera,  HEENT:    Cardiovascular: S1 S2 auscultated, no rubs, murmurs or gallops. Regular rate and rhythm.  Respiratory: Bilateral scattered rhonchi  Gastrointestinal: Soft, nontender, nondistended, + bowel sounds  Ext: no pedal edema bilaterally  Neuro: unable to assess  Musculoskeletal: No digital cyanosis, clubbing  Skin: No rashes  Psych: history of dementia, not following commands lethargic   Data Reviewed:  I have personally reviewed following labs and imaging studies  Micro Results Recent Results (from the past 240 hour(s))  Blood Culture (routine x 2)     Status: None (Preliminary result)   Collection Time: 10/03/17 10:42 PM  Result Value Ref Range Status   Specimen  Description BLOOD RIGHT HAND  Final   Special Requests   Final    BOTTLES DRAWN AEROBIC AND ANAEROBIC Blood Culture adequate volume   Culture   Final    NO GROWTH < 12 HOURS Performed at [facility], [address] [place], [place] [address]    Report Status PENDING  Incomplete  Blood Culture (routine x 2)     Status: None (Preliminary result)   Collection Time: 10/03/17 10:43 PM  Result Value Ref Range Status   Specimen  Description BLOOD LEFT FOREARM  Final   Special Requests   Final    BOTTLES DRAWN AEROBIC AND ANAEROBIC Blood Culture adequate volume   Culture   Final    NO GROWTH < 12 HOURS Performed at [facility], [address] [place], [place] [address]    Report Status PENDING  Incomplete    Radiology Reports Dg Chest Port 1 View  Result Date: 10/03/2017 CLINICAL DATA:  Possible aspiration EXAM: PORTABLE CHEST 1 VIEW COMPARISON:  12/03/2016, 11/14/2016 FINDINGS: Left-sided pacing device as before. Chronic elevation of left diaphragm. Mild increased airspace disease at the left base. Right hilar fullness suspected to be due to vascular crowding and low lung volume. Aortic atherosclerosis. No pneumothorax. IMPRESSION: Minimal opacity at the left lung base, favor atelectasis. Right hilar soft tissue fullness, likely due to vascular crowding and low lung volume. Electronically Signed   By: Jasmine Pang M.D.   On: 10/03/2017 23:09    Lab Data:  CBC: Recent Labs  Lab 10/03/17 2242  WBC 3.8*  NEUTROABS 2.9  HGB 7.5*  HCT 23.8*  MCV 97.5  PLT 126*   Basic Metabolic Panel: Recent Labs  Lab 10/03/17 2242  NA 144  K 4.7  CL 107  CO2 27  GLUCOSE 222*  BUN 53*  CREATININE 1.82*  CALCIUM 9.2   GFR: CrCl cannot be calculated (Unknown ideal weight.). Liver Function Tests: Recent Labs  Lab 10/03/17 2242  AST 10*  ALT 12  ALKPHOS 28*  BILITOT 0.7  PROT 5.7*  ALBUMIN 2.8*   No results for input(s): LIPASE, AMYLASE in the last 168 hours. No  results for input(s): AMMONIA in the last 168 hours. Coagulation Profile: No results for input(s): INR, PROTIME in the last 168 hours. Cardiac Enzymes: No results for input(s): CKTOTAL, CKMB, CKMBINDEX, TROPONINI in the last 168 hours. BNP (last 3 results) No results for input(s): PROBNP in the last 8760 hours. HbA1C: No results for input(s): HGBA1C in the last 72 hours. CBG: Recent Labs  Lab 10/04/17 0904  GLUCAP 242*   Lipid Profile: No results for input(s): CHOL, HDL, LDLCALC, TRIG, CHOLHDL, LDLDIRECT in the last 72 hours. Thyroid Function Tests: No results for input(s): TSH, T4TOTAL, FREET4, T3FREE, THYROIDAB in the last 72 hours. Anemia Panel: No results for input(s): VITAMINB12, FOLATE, FERRITIN, TIBC, IRON, RETICCTPCT in the last 72 hours. Urine analysis:    Component Value Date/Time   COLORURINE YELLOW 10/04/2017 0243   APPEARANCEUR TURBID (A) 10/04/2017 0243   LABSPEC 1.015 10/04/2017 0243   PHURINE 5.0 10/04/2017 0243   GLUCOSEU NEGATIVE 10/04/2017 0243   HGBUR MODERATE (A) 10/04/2017 0243   BILIRUBINUR NEGATIVE 10/04/2017 0243   KETONESUR NEGATIVE 10/04/2017 0243   PROTEINUR 30 (A) 10/04/2017 0243   UROBILINOGEN 0.2 10/29/2012 1316   NITRITE NEGATIVE 10/04/2017 0243   LEUKOCYTESUR NEGATIVE 10/04/2017 0243     Ripudeep Rai M.D. [facility] 10/04/2017, 11:13 AM  Pager: [phone] Between 7am to 7pm - call Pager - [phone]  After 7pm go to [email or web address] - password [email or web address]  Call night coverage person covering after 7pm

## 2017-10-04 NOTE — Significant Event (Signed)
Rapid Response Event Note  Overview:Called d/t pt having frequent runs of vtach. Time Called: 2040 Arrival Time: 2055 Event Type: Cardiac  Initial Focused Assessment: Pt lying in bed, confused (baseline). T-98.7, HR-140-150s, BP-98/68, RR-19, SpO2-93% on 3.5 L [place].  Originally pt was vpaced on monitor with freq PVCs.  While hooking up 12 lead EKG machine, pt went into a wide complex tachycardia.  First EKG showed SR with freq PVCs.  Pt's rhythm then changed again so second EKG performed showing Afib with RVR.  Lungs diminished t/o. Heart murmur present.  Interventions: Cardizem gtt with 5mg  bolus. Family updated by NP and pt made DNR.  Plan of Care (if not transferred): Start cardizem gtt and give 5mg  bolus.  If BP drops will d/c cardizem and call NP/family for possible comfort care. Call RRT if further assistance needed.  Event Summary: Name of Physician Notified: Craige Cotta, NP at 2110    at    Outcome: Code status clarified(pt made DNR)  Event End Time: 2133  Terrilyn Saver

## 2017-10-04 NOTE — H&P (Addendum)
[facility]                                                                                    Patient Demographics  Lameek Gower, is a 77 y.o. male  CSN: [id number]  MRN: [id number]  DOB - Aug 04, 1940  Admit Date - 10/03/2017  Outpatient Primary MD for the patient is Mirna Mires, MD   With History of -  Past Medical History:  Diagnosis Date  . Acute encephalopathy   . AICD (automatic cardioverter/defibrillator) present   . Anemia   . Arthritis    "all over" (05/16/2013)  . Atrial flutter (HCC)   . CAD (coronary artery disease)   . Cardiomyopathy (HCC)    a. 10/2012 Echo: EF 20-25%, diff HK, mod dil LA/RV/RA.  Marland Kitchen Cervical myelopathy (HCC)   . Cervical spinal stenosis   . Chronic systolic congestive heart failure (HCC)   . CKD (chronic kidney disease), stage III   . 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 CVA (cerebral vascular accident) (HCC)   . Decreased vision of right eye   . Dementia   . Depression    "son died in service in 07-26-90; still bothers me" (05/16/2013)  . DVT (deep venous thrombosis) (HCC) 10/2011   RUE; pt denies this hx on 05/16/2013  . Dysrhythmia   . Frequent falls   . GERD (gastroesophageal reflux disease)   . H/O hiatal hernia   . Hyperlipidemia   . Hypertension   . Hypoglycemia   . Lumbar spinal stenosis   . Mononeuritis of unspecified site   . Myocardial infarction (HCC)   . Pneumonia 1960   , also 2014  . Quadriparesis (HCC) 26-Jul-2011   pre op  . Shortness of breath dyspnea    with exertion  . Type II diabetes mellitus (HCC)    type 2  . Venous insufficiency       Past Surgical History:  Procedure Laterality Date  . ANTERIOR CERVICAL DECOMP/DISCECTOMY FUSION  10/31/2011   Procedure: ANTERIOR CERVICAL DECOMPRESSION/DISCECTOMY FUSION 2 LEVELS;  Surgeon: Cristi Loron, MD;  Location: [facility];  Service: Neurosurgery;  Laterality: N/A;   Cervical Four-Five Cervical Five-Six Anterior Cervical Decompression with fusion interbody prothesis plating and bonegraft  . ANTERIOR CERVICAL DECOMP/DISCECTOMY FUSION N/A 04/17/2014   Procedure: CERVICAL THREE TO FOUR ANTERIOR CERVICAL DECOMPRESSION/DISCECTOMY FUSION 1 LEVEL/HARDWARE REMOVAL;  Surgeon: Tressie Stalker, MD;  Location: [facility];  Service: Neurosurgery;  Laterality: N/A;  C34 anterior cervical decompression with fusion interbody prosthesis plating and bonegraft with exploration of prev fusion and possible removal of old hardware  . BI-VENTRICULAR IMPLANTABLE CARDIOVERTER DEFIBRILLATOR N/A 05/16/2013   Procedure: BI-VENTRICULAR IMPLANTABLE CARDIOVERTER DEFIBRILLATOR  (CRT-D);  Surgeon: Marinus Maw, MD;  Location: [facility] CATH LAB;  Service: Cardiovascular;  Laterality: N/A;  . BI-VENTRICULAR IMPLANTABLE CARDIOVERTER DEFIBRILLATOR  (CRT-D) Left 05/16/2013   STJ CRTD implanted by Dr Ladona Ridgel for primary prevention/CHF  . CARDIAC CATHETERIZATION  10/2012  . CARPAL TUNNEL RELEASE Right ~ July 26, 2011  . CATARACT EXTRACTION W/ INTRAOCULAR LENS IMPLANT Bilateral   . JOINT REPLACEMENT  2013  . LEFT AND RIGHT HEART CATHETERIZATION WITH CORONARY ANGIOGRAM N/A 11/06/2012   Procedure: LEFT AND RIGHT HEART CATHETERIZATION WITH CORONARY ANGIOGRAM;  Surgeon: Laurey Morale, MD;  Location: [facility] CATH LAB;  Service: Cardiovascular;  Laterality: N/A;  . LUMBAR LAMINECTOMY/DECOMPRESSION MICRODISCECTOMY N/A 02/02/2015   Procedure: LUMBAR LAMINECTOMY/DECOMPRESSION MICRODISCECTOMY 2 LEVELS;  Surgeon: Tressie Stalker, MD;  Location: [facility];  Service: Neurosurgery;  Laterality: N/A;  L34 L45 laminectomy and foraminotomy  . LUMBAR WOUND DEBRIDEMENT N/A 03/04/2015   Procedure: Incision and Drainage of LUMBAR WOUND ;  Surgeon: Tressie Stalker, MD;  Location: [facility];  Service: Neurosurgery;  Laterality: N/A;  . 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 toe removal  Left    3rd and 4th toe  removed  . TOTAL HIP ARTHROPLASTY Right ~ 2012    in for   Chief Complaint  Patient presents with  . Code Sepsis     HPI  Jared Whorley  is a 77 y.o. male, nursing home resident, with history of chronic congestive heart failure, cardiomyopathy with ejection fraction 20 to 25%, history of CVA, encephalopathy presenting today with altered mental status, hypotension and hypoxemia following a choking accident in the nursing home earlier.  Patient was started on IV antibiotics, IV fluids and placed on Ventimask.  His vital signs improved however patient remained obtunded.  Could not elicit any history from patient.    Review of Systems    Unable to obtain due to patient's condition  Social History Social History   Tobacco Use  . Smoking status: Never Smoker  . Smokeless tobacco: Never Used  Substance Use Topics  . Alcohol use: No    Comment: 05/16/2013 "used to drink a little bit a long time ago; nothing in over 30 yrs"     Family History Family History  Problem Relation Age of Onset  . Diabetes Brother   . Cancer Father        Lung cancer  . Heart attack Mother        pt thinks she had MI  . Cancer Mother      Prior to Admission medications   Medication Sig Start Date End Date Taking? Authorizing Provider  Calcium Carbonate Antacid (TUMS PO) Take 1 tablet by mouth 2 (two) times daily as needed (indigestion).    [provider]  carvedilol (COREG) 6.25 MG tablet Take 1 tablet (6.25 mg total) by mouth 2 (two) times daily. 09/05/16   Marinus Maw, MD  divalproex (DEPAKOTE) 125 MG DR tablet Take 125 mg by mouth 2 (two) times daily.    [provider]  escitalopram (LEXAPRO) 10 MG tablet Take 1 tablet (10 mg total) by mouth daily. 11/17/16   Laveda Abbe, NP  fluticasone [facility]) 50 MCG/ACT nasal spray Place 2 sprays into both nostrils daily as needed for allergies. 12/16/15   Sharon Seller, NP  gabapentin (NEURONTIN) 100 MG capsule Take 100  mg by mouth 2 (two) times daily.    [provider]  gabapentin (NEURONTIN) 100 MG capsule Take 2 capsules (200 mg total) by mouth 2 (two) times daily. 11/16/16   Laveda Abbe, NP  hydrALAZINE (APRESOLINE) 25 MG tablet TAKE ONE-HALF TABLET BY MOUTH THREE TIMES DAILY Patient taking differently: Take 12.5 mg by mouth 3 (three) times daily.  12/16/15   Sharon Seller, NP  ipratropium (ATROVENT) 0.06 % nasal spray Place 2 sprays into both nostrils 4 (four) times  daily.    [provider]  isosorbide mononitrate (IMDUR) 30 MG 24 hr tablet Take 1 tablet (30 mg total) by mouth daily. 12/16/15   Sharon Seller, NP  ivabradine (CORLANOR) 5 MG TABS tablet Take 1 tablet (5 mg total) by mouth 2 (two) times daily with a meal. 12/16/15   Sharon Seller, NP  magnesium hydroxide (MILK OF MAGNESIA) 400 MG/5ML suspension Take 30 mLs by mouth daily as needed. Patient taking differently: Take 30 mLs by mouth daily as needed for mild constipation.  12/16/15   Sharon Seller, NP  menthol-zinc oxide (GOLD BOND) powder Apply 1 application topically 2 (two) times daily as needed (jock itch).    [provider]  metFORMIN (GLUCOPHAGE) 1000 MG tablet Take 1,000 mg by mouth 2 (two) times daily with a meal.    [provider]  pantoprazole (PROTONIX) 40 MG tablet Take 40 mg by mouth daily.    [provider]  QUEtiapine (SEROQUEL) 25 MG tablet Take 75 mg by mouth at bedtime.    [provider]  QUEtiapine (SEROQUEL) 25 MG tablet Take 3 tablets (75 mg total) by mouth at bedtime. 11/16/16   Laveda Abbe, NP  Rivaroxaban (XARELTO) 15 MG TABS tablet Take 1 tablet (15 mg total) by mouth daily with supper. 12/16/15   Sharon Seller, NP  rosuvastatin (CRESTOR) 10 MG tablet Take 1 tablet (10 mg total) by mouth daily at 6 PM. 12/16/15   Janyth Contes, Janene Harvey, NP  silver sulfADIAZINE (SILVADENE) 1 % cream Apply 1 application topically daily as needed (wound  care).    [provider]  sodium chloride (DEEP SEA NASAL SPRAY) 0.65 % nasal spray Place 2 sprays into the nose every 2 (two) hours as needed for congestion. 12/16/15   Sharon Seller, NP  traMADol (ULTRAM) 50 MG tablet Take 50 mg by mouth every 8 (eight) hours as needed for moderate pain.    [provider]  traZODone (DESYREL) 50 MG tablet Take 1 tablet (50 mg total) by mouth at bedtime as needed for sleep. 12/25/15   Charm Rings, NP    Allergies  Allergen Reactions  . Acyclovir And Related Other (See Comments)    Unknown reaction  . Dristan Cold [Chlorphen-Pe-Acetaminophen] Anxiety    Makes pt feel "wired up"  . Prednisone Other (See Comments)    Messes with blood sugar levels     Physical Exam  Vitals  Blood pressure (!) 95/57, pulse 62, resp. rate (!) 21, SpO2 100 %.   1. General elderly male, acutely ill, well-developed, well-nourished  2.  Obtunded.  3.  Neuro: Unable to obtain due to mental status.  4. Ears and Eyes appear Normal, Conjunctivae clear, PERRLA. Moist Oral Mucosa.  5. Supple Neck, No JVD, No cervical lymphadenopathy appriciated, No Carotid Bruits.  6. Symmetrical Chest wall movement, decreased breath sounds at the bases.  7. RRR, No Gallops, Rubs or Murmurs, No Parasternal Heave.  8. Positive Bowel Sounds, Abdomen Soft, Non tender, No organomegaly appriciated,No rebound -guarding or rigidity.  9.  No Cyanosis, Normal Skin Turgor, No Skin Rash or Bruise.  10. Good muscle tone,  joints appear normal , no effusions, Normal ROM.    Data Review  CBC Recent Labs  Lab 10/03/17 2242  WBC 3.8*  HGB 7.5*  HCT 23.8*  PLT 126*  MCV 97.5  MCH 30.7  MCHC 31.5  RDW 16.9*  LYMPHSABS 0.5*  MONOABS 0.4  EOSABS 0.0  BASOSABS 0.0   ------------------------------------------------------------------------------------------------------------------  Chemistries  Recent Labs  Lab 10/03/17 2242  NA 144  K 4.7  CL 107  CO2  27  GLUCOSE 222*  BUN 53*  CREATININE 1.82*  CALCIUM 9.2  AST 10*  ALT 12  ALKPHOS 28*  BILITOT 0.7   ------------------------------------------------------------------------------------------------------------------ CrCl cannot be calculated (Unknown ideal weight.). ------------------------------------------------------------------------------------------------------------------ No results for input(s): TSH, T4TOTAL, T3FREE, THYROIDAB in the last 72 hours.  Invalid input(s): FREET3   Coagulation profile No results for input(s): INR, PROTIME in the last 168 hours. ------------------------------------------------------------------------------------------------------------------- No results for input(s): DDIMER in the last 72 hours. -------------------------------------------------------------------------------------------------------------------  Cardiac Enzymes No results for input(s): CKMB, TROPONINI, MYOGLOBIN in the last 168 hours.  Invalid input(s): CK ------------------------------------------------------------------------------------------------------------------ Invalid input(s): POCBNP   ---------------------------------------------------------------------------------------------------------------  Urinalysis    Component Value Date/Time   COLORURINE YELLOW 11/13/2016 1623   APPEARANCEUR CLEAR 11/13/2016 1623   LABSPEC 1.017 11/13/2016 1623   PHURINE 5.0 11/13/2016 1623   GLUCOSEU NEGATIVE 11/13/2016 1623   HGBUR NEGATIVE 11/13/2016 1623   BILIRUBINUR NEGATIVE 11/13/2016 1623   KETONESUR 5 (A) 11/13/2016 1623   PROTEINUR 30 (A) 11/13/2016 1623   UROBILINOGEN 0.2 10/29/2012 1316   NITRITE NEGATIVE 11/13/2016 1623   LEUKOCYTESUR NEGATIVE 11/13/2016 1623    ----------------------------------------------------------------------------------------------------------------   Imaging results:   Dg Chest Port 1 View  Result Date: 10/03/2017 CLINICAL DATA:   Possible aspiration EXAM: PORTABLE CHEST 1 VIEW COMPARISON:  12/03/2016, 11/14/2016 FINDINGS: Left-sided pacing device as before. Chronic elevation of left diaphragm. Mild increased airspace disease at the left base. Right hilar fullness suspected to be due to vascular crowding and low lung volume. Aortic atherosclerosis. No pneumothorax. IMPRESSION: Minimal opacity at the left lung base, favor atelectasis. Right hilar soft tissue fullness, likely due to vascular crowding and low lung volume. Electronically Signed   By: Jasmine Pang M.D.   On: 10/03/2017 23:09    My personal review of EKG: Rhythm NSR, rate 60 bpm    Assessment & Plan  1.  Respiratory failure 2.  Sepsis 3.  Pneumonia 4.  Altered mental status 5.  Chronic A. fib status post pacemaker on Xarelto 6.  Renal insufficiency  Plan IV antibiotics IV fluids Oxygen Goals of care/grave prognosis Will hold off heparin drip at this time since patient received Xarelto today, consider in a.m.     DVT Prophylaxis Lovenox  AM Labs Ordered, also please review Full Orders    Code Status DNI  Disposition Plan: Nursing home  Time spent in minutes : 42 minutes  Condition critical Prognosis: Grave   @SIGNATURE @

## 2017-10-04 NOTE — ED Notes (Signed)
Cleaned patient up, changed linen x2. Patient had 4 bms during change.

## 2017-10-04 NOTE — Progress Notes (Signed)
Initial Nutrition Assessment  DOCUMENTATION CODES:   Not applicable  INTERVENTION:  Provide supplemental nutrition as needed with diet advancement   NUTRITION DIAGNOSIS:   Inadequate oral intake related to inability to eat as evidenced by NPO status.  GOAL:   Patient will meet greater than or equal to 90% of their needs  MONITOR:   Diet advancement, Weight trends  REASON FOR ASSESSMENT:   Low Braden    ASSESSMENT:      Patient with PMH chronic CHF, CVA, CKD III, Dementia, T2DM, presents with AMS, hypotension, acute hypoxic respiratory failure possibly secondary to aspiration, sepsis, AKI.  He was unable to provide any history, no family at bedside. Per MD sepsis physiology has resolved. Currently NPO.   Per chart, his weight has been stable over the past 1.5 years. UOP last shift.   Appears he may be wheelchair or bed bound at baseline as his NFPE demonstrated severe muscle wasting in his lower extremities and hand but otherwise no signs of malnutrition. Will attempt to retrieve diet history at follow-up.  Labs reviewed:  CBGs 206, 246 BUN/Cr 53/1.82 BNP 721  Medications reviewed and include:  Insulin NS at 29mL/hr  NUTRITION - FOCUSED PHYSICAL EXAM:    Most Recent Value  Orbital Region  No depletion  Upper Arm Region  No depletion  Thoracic and Lumbar Region  No depletion  Buccal Region  Mild depletion  Temple Region  Mild depletion  Clavicle Bone Region  Mild depletion  Clavicle and Acromion Bone Region  Mild depletion  Scapular Bone Region  Unable to assess  Dorsal Hand  Severe depletion  Patellar Region  Severe depletion  Anterior Thigh Region  Severe depletion  Posterior Calf Region  Severe depletion       Diet Order:   Diet Order           Diet NPO time specified  Diet effective now          EDUCATION NEEDS:   Not appropriate for education at this time  Skin:  Skin Assessment: Reviewed RN Assessment  Last BM:   10/04/2017  Height:   Ht Readings from Last 1 Encounters:  11/13/16 5\' 10"  (1.778 m)    Weight:   Wt Readings from Last 1 Encounters:  10/04/17 161 lb 13.1 oz (73.4 kg)    Ideal Body Weight:  75.45 kg  BMI:  Body mass index is 23.22 kg/m.  Estimated Nutritional Needs:   Kcal:  1800-2000 calories  Protein:  88-95 grams (1.2-1.3g/kg)  Fluid:  UOP +531mL    Dionne Ano. Shahida Schnackenberg, MS, RD LDN Inpatient Clinical Dietitian Pager [phone]

## 2017-10-04 NOTE — Progress Notes (Signed)
Pt has been having episodic wide complex tachycardia and PVCs tonight, then went into Afib with RVR. Cardizem drip started and NP called POA. His daughter-n-law and son are POAs. They were informed about the changes in his status tonight and we talked about his Code status. After explanation of situation and pt's grave prognosis given his comorbidities, it was decided that he will be made a complete DNR. NP informed family that his BP may not hold with the drip and if worse, NP would call back and discusss comfort care. Family very open to this and not wanting him to hurt or suffer. Order changed to DNR. RN aware.  KJKG, NP [facility]

## 2017-10-04 NOTE — Progress Notes (Signed)
Pharmacy Antibiotic Note  [name] is a 77 y.o. male admitted on 10/03/2017 with sepsis.  Pharmacy has been consulted for Vancomycin/Cefepime dosing. WBC 3.8. Noted renal dysfunction. Lactic acid elevated.   Plan: Vancomycin 1000 mg IV q24h Cefepime 2g IV q24h Trend WBC, temp, renal function  F/U infectious work-up Drug levels as indicated  Temp (24hrs), Avg:98.4 F (36.9 C), Min:98.4 F (36.9 C), Max:98.4 F (36.9 C)  Recent Labs  Lab 10/03/17 2242 10/03/17 2251  WBC 3.8*  --   CREATININE 1.82*  --   LATICACIDVEN  --  2.76*    CrCl cannot be calculated (Unknown ideal weight.).    Allergies  Allergen Reactions  . Acyclovir And Related Other (See Comments)    Unknown reaction  . Dristan Cold [Chlorphen-Pe-Acetaminophen] Anxiety    Makes pt feel "wired up"  . Prednisone Other (See Comments)    Messes with blood sugar levels     [name] 10/04/2017 2:42 AM

## 2017-10-04 NOTE — Progress Notes (Signed)
Dr. Isidoro Donning notified of lactic acid 2.5. Waiting fo ra response.

## 2017-10-04 NOTE — Progress Notes (Signed)
No charge note.  PMT meeting scheduled at 7:30 am on 7/4.  Norvel Richards, PA-C Palliative Medicine Pager: [phone]

## 2017-10-04 NOTE — Progress Notes (Signed)
CRITICAL VALUE ALERT  Critical Value:  4  Date & Time Notied:  7/3 & 0654  Provider Notified: Dr. Isidoro Donning  Orders Received/Actions taken: Will continue to monitor pt and await new orders.

## 2017-10-05 DIAGNOSIS — F0281 Dementia in other diseases classified elsewhere with behavioral disturbance: Secondary | ICD-10-CM

## 2017-10-05 DIAGNOSIS — Z515 Encounter for palliative care: Secondary | ICD-10-CM

## 2017-10-05 DIAGNOSIS — I255 Ischemic cardiomyopathy: Secondary | ICD-10-CM

## 2017-10-05 DIAGNOSIS — N183 Chronic kidney disease, stage 3 (moderate): Secondary | ICD-10-CM

## 2017-10-05 DIAGNOSIS — N179 Acute kidney failure, unspecified: Secondary | ICD-10-CM

## 2017-10-05 LAB — BASIC METABOLIC PANEL
ANION GAP: 8 (ref 5–15)
BUN: 45 mg/dL — ABNORMAL HIGH (ref 8–23)
CALCIUM: 8.4 mg/dL — AB (ref 8.9–10.3)
CO2: 25 mmol/L (ref 22–32)
CREATININE: 1.64 mg/dL — AB (ref 0.61–1.24)
Chloride: 114 mmol/L — ABNORMAL HIGH (ref 98–111)
GFR calc Af Amer: 45 mL/min — ABNORMAL LOW (ref >60)
GFR calc non Af Amer: 39 mL/min — ABNORMAL LOW (ref >60)
GLUCOSE: 136 mg/dL — AB (ref 70–99)
Potassium: 4.2 mmol/L (ref 3.5–5.1)
Sodium: 147 mmol/L — ABNORMAL HIGH (ref 135–145)

## 2017-10-05 LAB — CBC
HEMATOCRIT: 23.8 % — AB (ref 39.0–52.0)
Hemoglobin: 7.5 g/dL — ABNORMAL LOW (ref 13.0–17.0)
MCH: 30.4 pg (ref 26.0–34.0)
MCHC: 31.5 g/dL (ref 30.0–36.0)
MCV: 96.4 fL (ref 78.0–100.0)
Platelets: 113 10*3/uL — ABNORMAL LOW (ref 150–400)
RBC: 2.47 MIL/uL — ABNORMAL LOW (ref 4.22–5.81)
RDW: 17.1 % — AB (ref 11.5–15.5)
WBC: 8.4 10*3/uL (ref 4.0–10.5)

## 2017-10-05 LAB — GLUCOSE, CAPILLARY: Glucose-Capillary: 127 mg/dL — ABNORMAL HIGH (ref 70–99)

## 2017-10-05 MED ORDER — GLYCOPYRROLATE 0.2 MG/ML IJ SOLN: 0.2000 mg | INTRAMUSCULAR | Status: DC | PRN

## 2017-10-05 MED ORDER — HALOPERIDOL LACTATE 5 MG/ML IJ SOLN: 0.5000 mg | INTRAMUSCULAR | Status: DC | PRN

## 2017-10-05 MED ORDER — ONDANSETRON 4 MG PO TBDP: 4.0000 mg | ORAL_TABLET | Freq: Four times a day (QID) | ORAL | Status: DC | PRN

## 2017-10-05 MED ORDER — MORPHINE SULFATE (CONCENTRATE) 10 MG/0.5ML PO SOLN: 5.0000 mg | ORAL | 0 refills | Status: AC | PRN

## 2017-10-05 MED ORDER — POLYVINYL ALCOHOL 1.4 % OP SOLN
1.0000 [drp] | Freq: Four times a day (QID) | OPHTHALMIC | Status: DC | PRN
  Filled 2017-10-05: qty 15

## 2017-10-05 MED ORDER — SENNA 8.6 MG PO TABS: 1.0000 | ORAL_TABLET | Freq: Every evening | ORAL | 0 refills | Status: AC | PRN

## 2017-10-05 MED ORDER — GLYCOPYRROLATE 1 MG PO TABS: 1.0000 mg | ORAL_TABLET | ORAL | 0 refills | Status: AC | PRN

## 2017-10-05 MED ORDER — ONDANSETRON HCL 4 MG/2ML IJ SOLN: 4.0000 mg | Freq: Four times a day (QID) | INTRAMUSCULAR | Status: DC | PRN

## 2017-10-05 MED ORDER — SODIUM CHLORIDE 0.9% FLUSH: 3.0000 mL | INTRAVENOUS | Status: DC | PRN

## 2017-10-05 MED ORDER — HALOPERIDOL 0.5 MG PO TABS: 0.5000 mg | ORAL_TABLET | ORAL | 0 refills | Status: AC | PRN

## 2017-10-05 MED ORDER — MORPHINE SULFATE (CONCENTRATE) 10 MG/0.5ML PO SOLN
5.0000 mg | Freq: Four times a day (QID) | ORAL | Status: DC
  Administered 2017-10-05 (×2): 5 mg via SUBLINGUAL
  Filled 2017-10-05 (×2): qty 0.5

## 2017-10-05 MED ORDER — BIOTENE DRY MOUTH MT LIQD: 15.0000 mL | OROMUCOSAL | Status: DC | PRN

## 2017-10-05 MED ORDER — SODIUM CHLORIDE 0.9 % IV SOLN: 250.0000 mL | INTRAVENOUS | Status: DC | PRN

## 2017-10-05 MED ORDER — HALOPERIDOL LACTATE 2 MG/ML PO CONC
0.5000 mg | ORAL | Status: DC | PRN
  Filled 2017-10-05: qty 0.3

## 2017-10-05 MED ORDER — SODIUM CHLORIDE 0.9% FLUSH
3.0000 mL | Freq: Two times a day (BID) | INTRAVENOUS | Status: DC
  Administered 2017-10-05: 3 mL via INTRAVENOUS

## 2017-10-05 MED ORDER — MORPHINE SULFATE (CONCENTRATE) 10 MG/0.5ML PO SOLN: 5.0000 mg | ORAL | Status: DC | PRN

## 2017-10-05 MED ORDER — GLYCOPYRROLATE 1 MG PO TABS: 1.0000 mg | ORAL_TABLET | ORAL | Status: DC | PRN

## 2017-10-05 MED ORDER — HALOPERIDOL 0.5 MG PO TABS
0.5000 mg | ORAL_TABLET | ORAL | Status: DC | PRN
Start: 2017-10-05 — End: 2017-10-05

## 2017-10-05 MED ORDER — SENNA 8.6 MG PO TABS: 1.0000 | ORAL_TABLET | Freq: Every evening | ORAL | Status: DC | PRN

## 2017-10-05 NOTE — Progress Notes (Signed)
Responded to [facility] to support patient.  Per pt nurse - Patient has dementia and cannot communicate with staff . Patient is also comfort care and planning to go to SNF with hospice.  Nurse will notify family that Chaplain visited with patient per their request but patient not able to communicate.  Prayed for patient. Will follow as needed.  Venida Jarvis, [place], [facility] , Pager [phone]

## 2017-10-05 NOTE — Progress Notes (Signed)
Patient will discharge to [facility] Anticipated discharge date: 7/4 Family notified: pt son Transportation by [facility]- scheduled for 4pm Report #: [phone]  CSW signing off.  Burna Sis, LCSW Clinical Social Worker [phone]

## 2017-10-05 NOTE — Progress Notes (Signed)
[facility]                                                                              Patient Demographics  [name], is a 77 y.o. male, DOB - July 10, 1940, MAU:[id number]  Admit date - 10/03/2017   Admitting Physician Carron Curie, MD  Outpatient Primary MD for the patient is Mirna Mires, MD  Outpatient specialists:   LOS - 1  days   Medical records reviewed and are as summarized below:    Chief Complaint  Patient presents with  . Code Sepsis       Brief summary   Patient is a 77 year old male with history of chronic CHF, EF 20 to 25%, history of CVA, CKD stage III, dementia, presented with altered mental status, hypotension, hypoxemia following a choking episode in the nursing home.  Patient was started on IV antibiotics, IV fluids and placed on Ventimask.  Assessment & Plan    Principal problem Acute hypoxic respiratory failure possibly secondary to aspiration event -Per EMS, patient aspirated at lunch and was supposed to be transported to an x-ray when he became unresponsive, BP dropping to 70/30, fever 103.  Patient was placed on epinephrine drip in route to the ED -Patient was placed on IV antibiotics, NRB 15 L, IV fluids.  - Palliative care was consulted for goals of care -Weaned off off Ventimask, currently O2 sats 99% on 4.5 L.  Somewhat more alert and awake -Goals of care meeting today, patient's family requested for comfort care status.  -Discussed with palliative, will need to turn AICD off prior to discharge to SNF with hospice -Antibiotics discontinued, most form signed   Active problems Atrial fibrillation with RVR -Overnight patient was noted to be in A. fib with RVR and was placed on Cardizem drip -Currently heart rate controlled, comfort care status  Acute encephalopathy superimposed on dementia -Appears to have history of dementia, unknown baseline -CT head showed no acute stroke -Alert and awake today, comfort care status     Sepsis with lactic acidosis -At the time of admission, per EMS, febrile, hypotensive.  Sepsis physiology resolved  Acute kidney injury on CKD stage III -Creatinine 1.8 at the time of admission, baseline 1.2 in 8/18 -Improved to 1.6, comfort care status   Chronic anemia -FOBT negative, hemoglobin 7.5, comfort care status.  At this point no blood transfusion.  Type 2 diabetes mellitus, insulin-dependent -Continue comfort care diet  History of CAD, CHF, history of ischemic cardiomyopathy, with ICD -Prior echo in 2017, EF 50 to 55%, grade 1 diastolic dysfunction  - Currently holding medications including Coreg, Xarelto, ivabradine, Imdur, hydralazine  Hypertension -BP currently stable, continue to hold all cardiac medications  Hyperlipidemia -Currently n.p.o., continue to hold rosuvastatin  History of stroke in February 2018, thought to be embolic stroke -CT head negative for acute CVA, currently comfort care status  Code Status: DNR, DNI, comfort care, most form signed DVT Prophylaxis:   SCD's Family Communication:  Disposition Plan: Awaiting [employer] rep to turn AICD of and the social work consult placed for discharge to skilled nursing facility with hospice  Time Spent in minutes 25 minutes  Procedures:  None  Consultants:   None  Antimicrobials:   IV vancomycin 7/3  IV cefepime 7/3   Medications  Scheduled Meds: . morphine CONCENTRATE  5 mg Sublingual Q6H  . sodium chloride flush  3 mL Intravenous Q12H   Continuous Infusions: . sodium chloride     PRN Meds:.sodium chloride, albuterol, antiseptic oral rinse, glycopyrrolate **OR** glycopyrrolate **OR** glycopyrrolate, haloperidol **OR** haloperidol **OR** haloperidol lactate, morphine CONCENTRATE **OR** morphine CONCENTRATE, ondansetron **OR** ondansetron (ZOFRAN) IV, ondansetron **OR** ondansetron (ZOFRAN) IV, polyvinyl alcohol, senna, sodium chloride flush   Antibiotics   Anti-infectives (From  admission, onward)   Start     Dose/Rate Route Frequency Ordered Stop   10/04/17 2200  vancomycin (VANCOCIN) IVPB 1000 mg/200 mL premix  Status:  Discontinued     1,000 mg 200 mL/hr over 60 Minutes Intravenous Every 24 hours 10/04/17 0247 10/05/17 0923   10/04/17 0600  ceFEPIme (MAXIPIME) 2 g in sodium chloride 0.9 % 100 mL IVPB  Status:  Discontinued     2 g 200 mL/hr over 30 Minutes Intravenous Every 24 hours 10/04/17 0247 10/05/17 0923   10/03/17 2245  piperacillin-tazobactam (ZOSYN) IVPB 3.375 g     3.375 g 100 mL/hr over 30 Minutes Intravenous  Once 10/03/17 2233 10/04/17 0113   10/03/17 2245  vancomycin (VANCOCIN) IVPB 1000 mg/200 mL premix     1,000 mg 200 mL/hr over 60 Minutes Intravenous  Once 10/03/17 2233 10/04/17 0113        Subjective:    [name] was seen and examined today.  Alert and awake, still bed coughing.  Heart rate now better controlled on Cardizem drip at the time of my encounter..  Overnight events noted.  Unable to obtain any review of system from the patient.  Low-grade temp of 100.1 F  Objective:   Vitals:   10/05/17 0220 10/05/17 0343 10/05/17 0358 10/05/17 0746  BP:   137/78 (!) 136/58  Pulse: 66 72 70 60  Resp: 20 (!) 21 (!) 26 19  Temp:   99 F (37.2 C) 100.1 F (37.8 C)  TempSrc:   Oral Axillary  SpO2: 90% 90% 98% 99%  Weight:        Intake/Output Summary (Last 24 hours) at 10/05/2017 1240 Last data filed at 10/05/2017 0951 Gross per 24 hour  Intake 7.68 ml  Output 800 ml  Net -792.32 ml     Wt Readings from Last 3 Encounters:  10/04/17 73.4 kg (161 lb 13.1 oz)  11/13/16 74.8 kg (165 lb)  10/14/16 74.4 kg (164 lb)     Exam   General: Alert and awake  Eyes:   HEENT:    Cardiovascular: S1 S2 auscultated, irregularly irregular, no pedal edema   Respiratory: Bilateral rhonchi  Gastrointestinal: Soft, nontender, nondistended, + bowel sounds  Ext: no pedal edema bilaterally  Neuro: does not follow  commands  Musculoskeletal: No digital cyanosis, clubbing  Skin: No rashes  Psych: awake and alert   Data Reviewed:  I have personally reviewed following labs and imaging studies  Micro Results Recent Results (from the past 240 hour(s))  Blood Culture (routine x 2)     Status: None (Preliminary result)   Collection Time: 10/03/17 10:42 PM  Result Value Ref Range Status   Specimen Description BLOOD RIGHT HAND  Final   Special Requests   Final    BOTTLES DRAWN AEROBIC AND ANAEROBIC Blood Culture adequate volume   Culture   Final    NO GROWTH < 24 HOURS Performed  at [facility], [address] [place], [place] [address]    Report Status PENDING  Incomplete  Blood Culture (routine x 2)     Status: None (Preliminary result)   Collection Time: 10/03/17 10:43 PM  Result Value Ref Range Status   Specimen Description BLOOD LEFT FOREARM  Final   Special Requests   Final    BOTTLES DRAWN AEROBIC AND ANAEROBIC Blood Culture adequate volume   Culture   Final    NO GROWTH < 24 HOURS Performed at [facility], [address], [place], [place] [address]    Report Status PENDING  Incomplete    Radiology Reports Ct Head Wo Contrast  Result Date: 10/04/2017 CLINICAL DATA:  Altered mental status. EXAM: CT HEAD WITHOUT CONTRAST TECHNIQUE: Contiguous axial images were obtained from the base of the skull through the vertex without intravenous contrast. COMPARISON:  CT scan of December 03, 2016. FINDINGS: Brain: Old left occipital infarction is noted. Mild chronic ischemic white matter disease is noted. Minimal diffuse cortical atrophy is noted. No mass effect or midline shift is noted. Ventricular size is within normal limits. There is no evidence of mass lesion, hemorrhage or acute infarction. Vascular: No hyperdense vessel or unexpected calcification. Skull: Normal. Negative for fracture or focal lesion. Sinuses/Orbits: No acute finding. Other: None. IMPRESSION: Old left occipital  infarction. Mild chronic ischemic white matter disease. No acute intracranial abnormality seen. Electronically Signed   By: Lupita Raider, M.D.   On: 10/04/2017 12:34   Dg Chest Port 1 View  Result Date: 10/03/2017 CLINICAL DATA:  Possible aspiration EXAM: PORTABLE CHEST 1 VIEW COMPARISON:  12/03/2016, 11/14/2016 FINDINGS: Left-sided pacing device as before. Chronic elevation of left diaphragm. Mild increased airspace disease at the left base. Right hilar fullness suspected to be due to vascular crowding and low lung volume. Aortic atherosclerosis. No pneumothorax. IMPRESSION: Minimal opacity at the left lung base, favor atelectasis. Right hilar soft tissue fullness, likely due to vascular crowding and low lung volume. Electronically Signed   By: Jasmine Pang M.D.   On: 10/03/2017 23:09    Lab Data:  CBC: Recent Labs  Lab 10/03/17 2242 10/05/17 0250  WBC 3.8* 8.4  NEUTROABS 2.9  --   HGB 7.5* 7.5*  HCT 23.8* 23.8*  MCV 97.5 96.4  PLT 126* 113*   Basic Metabolic Panel: Recent Labs  Lab 10/03/17 2242 10/04/17 2134 10/05/17 0250  NA 144  --  147*  K 4.7 4.4 4.2  CL 107  --  114*  CO2 27  --  25  GLUCOSE 222*  --  136*  BUN 53*  --  45*  CREATININE 1.82*  --  1.64*  CALCIUM 9.2  --  8.4*  MG  --  1.2*  --    GFR: CrCl cannot be calculated (Unknown ideal weight.). Liver Function Tests: Recent Labs  Lab 10/03/17 2242  AST 10*  ALT 12  ALKPHOS 28*  BILITOT 0.7  PROT 5.7*  ALBUMIN 2.8*   No results for input(s): LIPASE, AMYLASE in the last 168 hours. No results for input(s): AMMONIA in the last 168 hours. Coagulation Profile: No results for input(s): INR, PROTIME in the last 168 hours. Cardiac Enzymes: No results for input(s): CKTOTAL, CKMB, CKMBINDEX, TROPONINI in the last 168 hours. BNP (last 3 results) No results for input(s): PROBNP in the last 8760 hours. HbA1C: No results for input(s): HGBA1C in the last 72 hours. CBG: Recent Labs  Lab 10/04/17 [phone]  10/04/17 1156 10/04/17 1719 10/04/17 2044 10/05/17 0745  GLUCAP 242* 206* 167* 110* 127*   Lipid Profile: No results for input(s): CHOL, HDL, LDLCALC, TRIG, CHOLHDL, LDLDIRECT in the last 72 hours. Thyroid Function Tests: No results for input(s): TSH, T4TOTAL, FREET4, T3FREE, THYROIDAB in the last 72 hours. Anemia Panel: No results for input(s): VITAMINB12, FOLATE, FERRITIN, TIBC, IRON, RETICCTPCT in the last 72 hours. Urine analysis:    Component Value Date/Time   COLORURINE YELLOW 10/04/2017 0243   APPEARANCEUR TURBID (A) 10/04/2017 0243   LABSPEC 1.015 10/04/2017 0243   PHURINE 5.0 10/04/2017 0243   GLUCOSEU NEGATIVE 10/04/2017 0243   HGBUR MODERATE (A) 10/04/2017 0243   BILIRUBINUR NEGATIVE 10/04/2017 0243   KETONESUR NEGATIVE 10/04/2017 0243   PROTEINUR 30 (A) 10/04/2017 0243   UROBILINOGEN 0.2 10/29/2012 1316   NITRITE NEGATIVE 10/04/2017 0243   LEUKOCYTESUR NEGATIVE 10/04/2017 0243     Havyn Ramo M.D. [facility] 10/05/2017, 12:40 PM  Pager: [phone] Between 7am to 7pm - call Pager - [phone]  After 7pm go to [email or web address] - password [email or web address]  Call night coverage person covering after 7pm

## 2017-10-05 NOTE — Discharge Summary (Signed)
Physician Discharge Summary   Patient ID: [name] MRN: [id number] DOB/AGE: [birth year] 77 y.o.  Admit date: 10/03/2017 Discharge date: 10/05/2017  Primary Care Physician:  Mirna Mires, MD   Recommendations for Outpatient Follow-up:  1. AICD will be of prior to discharge to SNF 2. DNR, comfort care. MOST form signed 3. Hospice to follow patient at nursing facility  Home Health: None  Equipment/Devices:   Discharge Condition: Guarded CODE STATUS: DNR, comfort care Diet recommendation: Comfort diet   Discharge Diagnoses:    . Acute hypoxic respiratory failure (HCC) secondary to aspiration Atrial fibrillation with RVR Sepsis with lactic acidosis . AKI (acute kidney injury) (HCC) on CKD stage III . CAD (coronary artery disease) . Cardiomyopathy, ischemic . CKD (chronic kidney disease) stage 3, GFR 30-59 ml/min (HCC) . Acute encephalopathy superimposed on dementia with behavioral disturbance . HTN (hypertension) . Hyperlipidemia . ICD (implantable cardioverter-defibrillator), biventricular, in situ- will be turned off  Chronic normocytic anemia Type 2 diabetes mellitus, insulin-dependent   Consults: Palliative medicine    Allergies:   Allergies  Allergen Reactions  . Acyclovir And Related Other (See Comments)    Unknown reaction (NOT NOTED ON MAR FROM [facility], HOWEVER)  . Dristan Cold [Chlorphen-Pe-Acetaminophen] Anxiety    Makes pt feel "wired up" (NOT NOTED ON MAR FROM [facility], HOWEVER)  . Prednisone Other (See Comments)    Affects BGL(s); (NOT NOTED ON MAR FROM [facility], HOWEVER)      DISCHARGE MEDICATIONS: Allergies as of 10/05/2017      Reactions   Acyclovir And Related Other (See Comments)   Unknown reaction (NOT NOTED ON MAR FROM [facility], HOWEVER)   Dristan Cold [chlorphen-pe-acetaminophen] Anxiety   Makes pt feel "wired up" (NOT NOTED ON MAR FROM [facility], HOWEVER)   Prednisone Other (See Comments)   Affects BGL(s); (NOT NOTED ON  MAR FROM [facility], HOWEVER)      Medication List    STOP taking these medications   BIOFREEZE 4 % Gel Generic drug:  Menthol (Topical Analgesic)   carvedilol 6.25 MG tablet Commonly known as:  COREG   divalproex 125 MG DR tablet Commonly known as:  DEPAKOTE   escitalopram 20 MG tablet Commonly known as:  LEXAPRO   fluticasone 50 MCG/ACT nasal spray Commonly known as:  FLONASE   gabapentin 100 MG capsule Commonly known as:  NEURONTIN   guaiFENesin 600 MG 12 hr tablet Commonly known as:  MUCINEX   hydrALAZINE 25 MG tablet Commonly known as:  APRESOLINE   ipratropium 0.06 % nasal spray Commonly known as:  ATROVENT   isosorbide mononitrate 30 MG 24 hr tablet Commonly known as:  IMDUR   ivabradine 5 MG Tabs tablet Commonly known as:  CORLANOR   magnesium hydroxide 400 MG/5ML suspension Commonly known as:  MILK OF MAGNESIA   menthol-zinc oxide powder   metFORMIN 1000 MG tablet Commonly known as:  GLUCOPHAGE   NOVOLOG 100 UNIT/ML injection Generic drug:  insulin aspart   pantoprazole 40 MG tablet Commonly known as:  PROTONIX   POLY-IRON 150 PO   predniSONE 10 MG tablet Commonly known as:  DELTASONE   QUEtiapine 50 MG tablet Commonly known as:  SEROQUEL   Rivaroxaban 15 MG Tabs tablet Commonly known as:  XARELTO   rosuvastatin 10 MG tablet Commonly known as:  CRESTOR   silver sulfADIAZINE 1 % cream Commonly known as:  SILVADENE   traMADol 50 MG tablet Commonly known as:  ULTRAM   traZODone 50 MG tablet Commonly known as:  DESYREL  trolamine salicylate 10 % cream Commonly known as:  ASPERCREME   TUMS PO   UNABLE TO FIND     TAKE these medications   glycopyrrolate 1 MG tablet Commonly known as:  ROBINUL Take 1 tablet (1 mg total) by mouth every 4 (four) hours as needed (excessive secretions).   haloperidol 0.5 MG tablet Commonly known as:  HALDOL Take 1 tablet (0.5 mg total) by mouth every 4 (four) hours as needed for agitation (or  delirium).   ipratropium-albuterol 0.5-2.5 (3) MG/3ML Soln Commonly known as:  DUONEB Take 3 mLs by nebulization every 4 (four) hours as needed (for cough and dyspnea).   morphine CONCENTRATE 10 MG/0.5ML Soln concentrated solution Place 0.25 mLs (5 mg total) under the tongue every 4 (four) hours as needed for severe pain or shortness of breath.   senna 8.6 MG Tabs tablet Commonly known as:  SENOKOT Take 1 tablet (8.6 mg total) by mouth at bedtime as needed for mild constipation.   sodium chloride 0.65 % nasal spray Commonly known as:  DEEP SEA NASAL SPRAY Place 2 sprays into the nose every 2 (two) hours as needed for congestion.        Brief H and P: For complete details please refer to admission H and P, but in briefPatient is a 77 year old male with history of chronic CHF, EF 20 to 25%, history of CVA, CKD stage III, dementia, presented with altered mental status, hypotension, hypoxemia following a choking episode in the nursing home.  Patient was started on IV antibiotics, IV fluids and placed on Ventimask.   Hospital Course:  Acute hypoxic respiratory failure possibly secondary to aspiration event -Per EMS, patient aspirated at lunch and was supposed to be transported to an x-ray when he became unresponsive, BP dropping to 70/30, fever 103.  Patient was placed on epinephrine drip in route to the ED -Patient was placed on IV antibiotics, NRB 15 L, IV fluids.  - Palliative care was consulted for goals of care -Weaned off off Ventimask, currently O2 sats 99% on 4.5 L.  Somewhat more alert and awake -Goals of care meeting today, patient's family requested for comfort care status.  -Discussed with palliative, will need to turn AICD off prior to discharge to SNF with hospice -Antibiotics discontinued, most form signed   Active problems Atrial fibrillation with RVR -Overnight patient was noted to be in A. fib with RVR and was placed on Cardizem drip -Currently heart rate  controlled, comfort care status  Acute encephalopathy superimposed on dementia -Appears to have history of dementia, unknown baseline -CT head showed no acute stroke -Alert and awake today, comfort care status   Sepsis with lactic acidosis -At the time of admission, per EMS, febrile, hypotensive.  Sepsis physiology resolved  Acute kidney injury on CKD stage III -Creatinine 1.8 at the time of admission, baseline 1.2 in 8/18 -Improved to 1.6, comfort care status   Chronic anemia -FOBT negative, hemoglobin 7.5, comfort care status.  At this point no blood transfusion.  Type 2 diabetes mellitus, insulin-dependent -Continue comfort care diet  History of CAD, CHF, history of ischemic cardiomyopathy, with ICD -Prior echo in 2017, EF 50 to 55%, grade 1 diastolic dysfunction  - Currently holding medications including Coreg, Xarelto, ivabradine, Imdur, hydralazine  Hypertension -BP currently stable, continue to hold all cardiac medications  Hyperlipidemia - continue to hold rosuvastatin  History of stroke in February 2018, thought to be embolic stroke -CT head negative for acute CVA, currently comfort care status  Day of Discharge S: somewhat more alert and awake today. still bed coughing.  Heart rate now better controlled on Cardizem drip at the time of my encounter..  Overnight events noted.  Unable to obtain any review of system from the patient.  Low-grade temp of 100.1 F    BP (!) 136/58 (BP Location: Right Arm)   Pulse 60   Temp 100.1 F (37.8 C) (Axillary)   Resp 19   Wt 73.4 kg (161 lb 13.1 oz)   SpO2 99%   BMI 23.22 kg/m   Physical Exam: General: Alert and awake, on [place] HEENT: anicteric sclera, pupils reactive to light and accommodation CVS: S1-S2 clear no murmur rubs or gallops Chest: Bilateral scattered rhonchi Abdomen: soft nontender, nondistended, normal bowel sounds Extremities: no cyanosis, clubbing or edema noted bilaterally Neuro: does not  follow commands   The results of significant diagnostics from this hospitalization (including imaging, microbiology, ancillary and laboratory) are listed below for reference.      Procedures/Studies:  Ct Head Wo Contrast  Result Date: 10/04/2017 CLINICAL DATA:  Altered mental status. EXAM: CT HEAD WITHOUT CONTRAST TECHNIQUE: Contiguous axial images were obtained from the base of the skull through the vertex without intravenous contrast. COMPARISON:  CT scan of December 03, 2016. FINDINGS: Brain: Old left occipital infarction is noted. Mild chronic ischemic white matter disease is noted. Minimal diffuse cortical atrophy is noted. No mass effect or midline shift is noted. Ventricular size is within normal limits. There is no evidence of mass lesion, hemorrhage or acute infarction. Vascular: No hyperdense vessel or unexpected calcification. Skull: Normal. Negative for fracture or focal lesion. Sinuses/Orbits: No acute finding. Other: None. IMPRESSION: Old left occipital infarction. Mild chronic ischemic white matter disease. No acute intracranial abnormality seen. Electronically Signed   By: Lupita Raider, M.D.   On: 10/04/2017 12:34   Dg Chest Port 1 View  Result Date: 10/03/2017 CLINICAL DATA:  Possible aspiration EXAM: PORTABLE CHEST 1 VIEW COMPARISON:  12/03/2016, 11/14/2016 FINDINGS: Left-sided pacing device as before. Chronic elevation of left diaphragm. Mild increased airspace disease at the left base. Right hilar fullness suspected to be due to vascular crowding and low lung volume. Aortic atherosclerosis. No pneumothorax. IMPRESSION: Minimal opacity at the left lung base, favor atelectasis. Right hilar soft tissue fullness, likely due to vascular crowding and low lung volume. Electronically Signed   By: Jasmine Pang M.D.   On: 10/03/2017 23:09       LAB RESULTS: Basic Metabolic Panel: Recent Labs  Lab 10/03/17 2242 10/04/17 2134 10/05/17 0250  NA 144  --  147*  K 4.7 4.4 4.2  CL  107  --  114*  CO2 27  --  25  GLUCOSE 222*  --  136*  BUN 53*  --  45*  CREATININE 1.82*  --  1.64*  CALCIUM 9.2  --  8.4*  MG  --  1.2*  --    Liver Function Tests: Recent Labs  Lab 10/03/17 2242  AST 10*  ALT 12  ALKPHOS 28*  BILITOT 0.7  PROT 5.7*  ALBUMIN 2.8*   No results for input(s): LIPASE, AMYLASE in the last 168 hours. No results for input(s): AMMONIA in the last 168 hours. CBC: Recent Labs  Lab 10/03/17 2242 10/05/17 0250  WBC 3.8* 8.4  NEUTROABS 2.9  --   HGB 7.5* 7.5*  HCT 23.8* 23.8*  MCV 97.5 96.4  PLT 126* 113*   Cardiac Enzymes: No results for input(s): CKTOTAL, CKMB, CKMBINDEX,  TROPONINI in the last 168 hours. BNP: Invalid input(s): POCBNP CBG: Recent Labs  Lab 10/04/17 2044 10/05/17 0745  GLUCAP 110* 127*      Disposition and Follow-up:    DISPOSITION: Nursing facility with hospice   DISCHARGE FOLLOW-UP  Contact information for follow-up providers    Mirna Mires, MD. Schedule an appointment as soon as possible for a visit.   Specialty:  Family Medicine Why:  as needed  Contact information: [address] [place] [address] [phone]            Contact information for after-discharge care    Destination    [facility] .   Service:  Skilled Nursing Contact information: [address] [place] [address] [phone]                   Time coordinating discharge:    Signed:   Thad Ranger M.D. [facility] 10/05/2017, 2:44 PM Pager: [phone]

## 2017-10-05 NOTE — Progress Notes (Signed)
RN called [facility] gave report to [employer]. In report, Shanda Bumps RN informed that pt is now a DNR on comfort care measures including comfort feeds, oxygen (4 L), and morphine SL Q6H. She verbalized understanding. Pt has been offered comfort feeds, but has declined them.   RN also reported to Shanda Bumps that [employer] (rep from [employer]) came to [facility] and shut off pt's ICD. RN informed Musician per Roxy Cedar that monitored matched to pt's ICD can now be shut off. Shanda Bumps RN verbalized understanding. PTAR set for 1600.   RN has removed pt's IV sites.

## 2017-10-05 NOTE — NC FL2 (Signed)
[place] MEDICAID FL2 LEVEL OF CARE SCREENING TOOL     IDENTIFICATION  Patient Name: [name] Birthdate: 10/22/1940 Sex: male Admission Date (Current Location): 10/03/2017  [facility] and [place] Number:  Producer, television/film/video and Address:  [facility] [place], [address] [place], [place] [address]      Provider Number: [id number]  Attending Physician Name and Address:  Cathren Harsh, MD  Relative Name and Phone Number:       Current Level of Care: Hospital Recommended Level of Care: Skilled Nursing Facility Prior Approval Number:    Date Approved/Denied:   PASRR Number:    Discharge Plan: SNF    Current Diagnoses: Patient Active Problem List   Diagnosis Date Noted  . Palliative care encounter   . Respiratory failure (HCC) 10/04/2017  . Dementia with behavioral disturbance 03/29/2016  . CKD (chronic kidney disease) stage 3, GFR 30-59 ml/min (HCC) 03/28/2016  . Diabetes (HCC) 03/27/2016  . AKI (acute kidney injury) (HCC) 03/27/2016  . Adjustment disorder with depressed mood 12/25/2015  . Memory deficits 12/01/2015  . Encephalopathy, metabolic 11/28/2015  . Acute left PCA stroke (HCC) 11/25/2015  . Wound dehiscence 03/03/2015  . Lumbar stenosis with neurogenic claudication 02/02/2015  . Cervical spondylosis with myelopathy 04/17/2014  . ICD (implantable cardioverter-defibrillator), biventricular, in situ 11/19/2013  . Cardiac resynchronization therapy defibrillator (CRT-D) in place 07/03/2013  . Chronic renal failure 01/24/2013  . CAD (coronary artery disease) 12/10/2012  . Chronic systolic heart failure (HCC) 11/20/2012  . Cardiomyopathy, ischemic 11/20/2012  . Atrial flutter (HCC) 11/08/2012  . Dilated cardiomyopathy (HCC) 11/01/2012  . Cervical stenosis of spine 10/28/2011  . Spondylosis of cervical joint 10/28/2011  . Cervical myelopathy (HCC) 10/28/2011  . Quadriparesis (HCC) 10/28/2011  . DVT of right proximal brachial through  the distal axillary vein, acute 10/06/2011  . Constipation due to pain medication 10/06/2011  . Venous insufficiency 10/05/2011  . 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 Hyperlipidemia 10/05/2011  . Normocytic anemia 10/03/2011  . Falls frequently 10/03/2011  . Diabetes mellitus with chronic kidney disease (HCC) 10/03/2011  . HTN (hypertension) 10/03/2011  . Gait abnormality secondary to lumbar spinal stenosis 10/03/2011  . Arthritis 10/03/2011    Orientation RESPIRATION BLADDER Height & Weight        O2(4.5 L [place]) Incontinent, External catheter Weight: (nurse notified of temperature) Height:     BEHAVIORAL SYMPTOMS/MOOD NEUROLOGICAL BOWEL NUTRITION STATUS        Diet(see DC summary)  AMBULATORY STATUS COMMUNICATION OF NEEDS Skin   Total Care Verbally Normal                       Personal Care Assistance Level of Assistance  Bathing, Dressing, Total care Bathing Assistance: Maximum assistance   Dressing Assistance: Maximum assistance Total Care Assistance: Maximum assistance   Functional Limitations Info             SPECIAL CARE FACTORS FREQUENCY                       Contractures      Additional Factors Info  Code Status, Allergies Code Status Info: DNR Allergies Info: Acyclovir And Related, Dristan Cold Chlorphen-pe-acetaminophen, Prednisone           Current Medications (10/05/2017):  This is the current hospital active medication list Current Facility-Administered Medications  Medication Dose Route Frequency Provider  Last Rate Last Dose  . 0.9 %  sodium chloride infusion  250 mL Intravenous PRN Dellinger, Clerance Lav L, PA-C      . albuterol (PROVENTIL) (2.5 MG/3ML) 0.083% nebulizer solution 2.5 mg  2.5 mg Nebulization Q4H PRN Rai, Ripudeep K, MD      . antiseptic oral rinse (BIOTENE) solution 15 mL  15 mL Topical PRN Dellinger, Tora Kindred, PA-C      . glycopyrrolate  (ROBINUL) tablet 1 mg  1 mg Oral Q4H PRN Dellinger, Clerance Lav L, PA-C       Or  . glycopyrrolate (ROBINUL) injection 0.2 mg  0.2 mg Subcutaneous Q4H PRN Dellinger, Clerance Lav L, PA-C       Or  . glycopyrrolate (ROBINUL) injection 0.2 mg  0.2 mg Intravenous Q4H PRN Dellinger, Clerance Lav L, PA-C      . haloperidol (HALDOL) tablet 0.5 mg  0.5 mg Oral Q4H PRN Dellinger, Clerance Lav L, PA-C       Or  . haloperidol (HALDOL) 2 MG/ML solution 0.5 mg  0.5 mg Sublingual Q4H PRN Dellinger, Clerance Lav L, PA-C       Or  . haloperidol lactate (HALDOL) injection 0.5 mg  0.5 mg Intravenous Q4H PRN Dellinger, Clerance Lav L, PA-C      . morphine CONCENTRATE 10 MG/0.5ML oral solution 5 mg  5 mg Oral Q2H PRN Dellinger, Clerance Lav L, PA-C       Or  . morphine CONCENTRATE 10 MG/0.5ML oral solution 5 mg  5 mg Sublingual Q2H PRN Dellinger, Clerance Lav L, PA-C      . morphine CONCENTRATE 10 MG/0.5ML oral solution 5 mg  5 mg Sublingual Q6H Dellinger, Marianne L, PA-C   5 mg at 10/05/17 0950  . ondansetron (ZOFRAN) tablet 4 mg  4 mg Oral Q6H PRN Carron Curie, MD       Or  . ondansetron (ZOFRAN) injection 4 mg  4 mg Intravenous Q6H PRN Carron Curie, MD      . ondansetron (ZOFRAN-ODT) disintegrating tablet 4 mg  4 mg Oral Q6H PRN Dellinger, Tora Kindred, PA-C       Or  . ondansetron (ZOFRAN) injection 4 mg  4 mg Intravenous Q6H PRN Dellinger, Tora Kindred, PA-C      . polyvinyl alcohol (LIQUIFILM TEARS) 1.4 % ophthalmic solution 1 drop  1 drop Both Eyes QID PRN Dellinger, Tora Kindred, PA-C      . senna (SENOKOT) tablet 8.6 mg  1 tablet Oral QHS PRN Dellinger, Clerance Lav L, PA-C      . sodium chloride flush (NS) 0.9 % injection 3 mL  3 mL Intravenous Q12H Dellinger, Marianne L, PA-C   3 mL at 10/05/17 0951  . sodium chloride flush (NS) 0.9 % injection 3 mL  3 mL Intravenous PRN Dellinger, Tora Kindred, PA-C         Discharge Medications: Please see discharge summary for a list of discharge medications.  Relevant Imaging Results:  Relevant Lab  Results:   Additional Information ss# [id number]; will need hospice to follow at SNF  Lakia Gritton, Wyn Quaker, LCSW

## 2017-10-05 NOTE — Consult Note (Signed)
Consultation Note Date: 10/05/2017   Patient Name: [name]  DOB: March 14, 1941  MRN: [id number]  Age / Sex: 77 y.o., male  PCP: Iona Beard, MD Referring Physician: Mendel Corning, MD  Reason for Consultation: Disposition, Non pain symptom management, Pain control and Psychosocial/spiritual support  HPI/Patient Profile: 77 y.o. male  with past medical history of quadriparesis, DM2, dementia, CVA, CKD, CHF (EF 20-25%) s/p AICD, aflutter who was admitted on 10/03/2017 from SNF after a choking episode and hypoxic respiratory failure.  He was severely septic with a lactic acid of over 6, fever and hypotensive.  He required a venti mask with 15L of oxygen. Late evening on 7/3 a rapid response was called for ventricular tachycardia.  He has been placed on a diltiazem drip.  Currently BP and pulse rate are stable.  Clinical Assessment and Goals of Care:  I have reviewed medical records including EPIC notes, labs and imaging, received report from the care team, assessed the patient and then met at the bedside along with his son and daughter in law  to discuss diagnosis prognosis, [place], EOL wishes, disposition and options.    The family is under duress right now as both Mr. Kenley is very ill and another family member had a cardiac arrest this week and is not expected to do well.  I introduced Palliative Medicine as specialized medical care for people living with serious illness. It focuses on providing relief from the symptoms and stress of a serious illness. The goal is to improve quality of life for both the patient and the family.  As far as functional and nutritional status he is bed bound with quadriparesis.  He tells me he is a little hungry but when I offer him a bite of applesauce he shakes his head no and spits it out.  He appears to aspirate the little bit he swallowed.  We discussed a brief life review  of the patient.  We discussed his current illness and what it means in the larger context of his on-going co-morbidities.  Natural disease trajectory and expectations at EOL were discussed.  I attempted to elicit values and goals of care important to the patient.  He is unable to answer me, but his family's primary goal for him is comfort.  They do not want him to suffer with pain or further aspiration.  The difference between aggressive medical intervention and comfort care was considered in light of the patient's goals of care.   The family requests full comfort care.  We completed a new MOST form ( and shredded the old one) they choose:  DNR Comfort Measures Only No antibiobics No fluids No artificial feedings or  PEG tube.  The family understands he is in the dying process and they want him to pass away in comfort.   Advanced directives, concepts specific to code status, artifical feeding and hydration, and rehospitalization were considered and discussed.  The patient was made DNR during his rapid response/Vtach episode on 7/3.    Hospice  and Palliative Care services outpatient were explained and offered.  The family choose for Mr. Dugal to receive Hospice Services at [facility] under long term care.  Questions and concerns were addressed.  The family was encouraged to call with questions or concerns.    Primary Decision Maker:  NEXT OF KIN son Louie Casa and DIL.    SUMMARY OF RECOMMENDATIONS    Full comfort.  DC antibiotics, fluids, dilt drip  Please ensure AICD is turned off prior to discharge  Scheduled morphine 5 mg SL q 6 hours for pain, shortness of breath and comfort at end of life.     Titrate morphine SL up for comfort as needed at SNF  Increase oral care.  Bowel regimen (with scheduled opioids).   Glycerin suppository daily.  Comfort feedings with aspiration precautions.  Return to [facility] as a long term resident (on [place]) with [facility].    Do  not return to the hospital unless comfort can not be achieved at [facility].  Code Status/Advance Care Planning:  DNR  Psycho-social/Spiritual:   Desire for further Chaplaincy support: yes requested.  Prognosis:  Days to weeks. In the setting of recurrent aspiration  Discharge Planning: [place] with Hospice      Primary Diagnoses: Present on Admission: . Respiratory failure ([place]) . AKI (acute kidney injury) ([place]) . CAD (coronary artery disease) . Cardiomyopathy, ischemic . CKD (chronic kidney disease) stage 3, GFR 30-59 ml/min (HCC) . Dementia with behavioral disturbance . HTN (hypertension) . Hyperlipidemia . ICD (implantable cardioverter-defibrillator), biventricular, in situ   I have reviewed the medical record, interviewed the patient and family, and examined the patient. The following aspects are pertinent.  Past Medical History:  Diagnosis Date  . Acute encephalopathy   . AICD (automatic cardioverter/defibrillator) present   . Anemia   . Arthritis    "all over" (05/16/2013)  . Atrial flutter ([place])   . CAD (coronary artery disease)   . Cardiomyopathy ([place])    a. 10/2012 Echo: EF 20-25%, diff HK, mod dil LA/RV/RA.  Marland Kitchen Cervical myelopathy ([place])   . Cervical spinal stenosis   . Chronic systolic congestive heart failure ([place])   . CKD (chronic kidney disease), stage III   . 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 CVA (cerebral vascular accident) ([place])   . Decreased vision of right eye   . Dementia   . Depression    "son died in service in 25-Jun-1990; still bothers me" (05/16/2013)  . DVT (deep venous thrombosis) ([place]) 10/2011   RUE; pt denies this hx on 05/16/2013  . Dysrhythmia   . Frequent falls   . GERD (gastroesophageal reflux disease)   . H/O hiatal hernia   . Hyperlipidemia   . Hypertension   . Hypoglycemia   . Lumbar spinal stenosis   . Mononeuritis of unspecified site    . Myocardial infarction ([place])   . Pneumonia Jun 25, 1958   , also 2012/06/25  . Quadriparesis ([place]) 2011-06-26   pre op  . Shortness of breath dyspnea    with exertion  . Type II diabetes mellitus ([place])    type 2  . Venous insufficiency    Social History   Socioeconomic History  . Marital status: Married    Spouse name: Not on file  . Number of children: Not on file  . Years of education: Not on file  . Highest education level: Not on file  Occupational History  . Not on file  Social Needs  . Financial resource strain: Not on file  . Food insecurity:    Worry: Not on file    Inability: Not on file  . Transportation needs:    Medical: Not on file    Non-medical: Not on file  Tobacco Use  . Smoking status: Never Smoker  . Smokeless tobacco: Never Used  Substance and Sexual Activity  . Alcohol use: No    Comment: 05/16/2013 "used to drink a little bit a long time ago; nothing in over 30 yrs"  . Drug use: No  . Sexual activity: Not Currently  Lifestyle  . Physical activity:    Days per week: Not on file    Minutes per session: Not on file  . Stress: Not on file  Relationships  . Social connections:    Talks on phone: Not on file    Gets together: Not on file    Attends religious service: Not on file    Active member of club or organization: Not on file    Attends meetings of clubs or organizations: Not on file    Relationship status: Not on file  Other Topics Concern  . Not on file  Social History Narrative  . Not on file   Family History  Problem Relation Age of Onset  . Diabetes Brother   . Cancer Father        Lung cancer  . Heart attack Mother        pt thinks she had MI  . Cancer Mother    Scheduled Meds: . insulin aspart  0-5 Units Subcutaneous QHS  . insulin aspart  0-9 Units Subcutaneous TID WC   Continuous Infusions: . sodium chloride 50 mL/hr at 10/04/17 0317  . ceFEPime (MAXIPIME) IV 2 g (10/05/17 0510)  . diltiazem (CARDIZEM) infusion 5 mg/hr (10/04/17  2203)  . vancomycin 1,000 mg (10/05/17 0004)   PRN Meds:.albuterol, ondansetron **OR** ondansetron (ZOFRAN) IV Allergies  Allergen Reactions  . Acyclovir And Related Other (See Comments)    Unknown reaction (NOT NOTED ON MAR FROM [facility], HOWEVER)  . Dristan Cold [Chlorphen-Pe-Acetaminophen] Anxiety    Makes pt feel "wired up" (NOT NOTED ON MAR FROM [facility], HOWEVER)  . Prednisone Other (See Comments)    Affects BGL(s); (NOT NOTED ON MAR FROM [facility], HOWEVER)    Review of Systems patient unable to provide  Physical Exam  Well developed quiet demented male, quadriparesis, answers 2 "yes/no" questions but will not speak with me further CV difficult to hear but no obvious m/r/g Resp on 5L at 95%, no distress, no w/c/r Abdomen soft, nt, nd LE no edema  Vital Signs: BP 137/78 (BP Location: Right Arm)   Pulse 70   Temp 99 F (37.2 C) (Oral)   Resp (!) 26   Wt 73.4 kg (161 lb 13.1 oz)   SpO2 98%   BMI 23.22 kg/m  Pain Scale: Faces      SpO2: SpO2: 98 % O2 Device:SpO2: 98 % O2 Flow Rate: .O2 Flow Rate (L/min): 15 L/min  IO: Intake/output summary:   Intake/Output Summary (Last 24 hours) at 10/05/2017 0723 Last data filed at 10/05/2017 5681 Gross per 24 hour  Intake 439.44 ml  Output 800 ml  Net -360.56 ml    LBM: Last BM Date: 10/04/17 Baseline Weight: Weight: 73.4 kg (161 lb 13.1 oz) Most recent weight: Weight: 73.4 kg (161 lb 13.1 oz)     Palliative Assessment/Data: 20%  Time In: 7:30 Time Out: 9:00 Time Total: 90 min Greater than 50%  of this time was spent counseling and coordinating care related to the above assessment and plan.  Signed by: Florentina Jenny, PA-C Palliative Medicine Pager: [phone]  Please contact Palliative Medicine Team phone at [phone] for questions and concerns.  For individual provider: See Shea Evans

## 2017-10-06 DIAGNOSIS — N183 Chronic kidney disease, stage 3 (moderate): Secondary | ICD-10-CM | POA: Diagnosis not present

## 2017-10-06 DIAGNOSIS — I499 Cardiac arrhythmia, unspecified: Secondary | ICD-10-CM | POA: Diagnosis not present

## 2017-10-06 DIAGNOSIS — I5020 Unspecified systolic (congestive) heart failure: Secondary | ICD-10-CM | POA: Diagnosis not present

## 2017-10-06 DIAGNOSIS — I25119 Atherosclerotic heart disease of native coronary artery with unspecified angina pectoris: Secondary | ICD-10-CM | POA: Diagnosis not present

## 2017-10-06 DIAGNOSIS — J690 Pneumonitis due to inhalation of food and vomit: Secondary | ICD-10-CM | POA: Diagnosis not present

## 2017-10-06 DIAGNOSIS — E1159 Type 2 diabetes mellitus with other circulatory complications: Secondary | ICD-10-CM | POA: Diagnosis not present

## 2017-10-06 DIAGNOSIS — R1310 Dysphagia, unspecified: Secondary | ICD-10-CM | POA: Diagnosis not present

## 2017-10-06 DIAGNOSIS — D638 Anemia in other chronic diseases classified elsewhere: Secondary | ICD-10-CM | POA: Diagnosis not present

## 2017-10-06 DIAGNOSIS — I679 Cerebrovascular disease, unspecified: Secondary | ICD-10-CM | POA: Diagnosis not present

## 2017-10-06 DIAGNOSIS — I429 Cardiomyopathy, unspecified: Secondary | ICD-10-CM | POA: Diagnosis not present

## 2017-10-06 DIAGNOSIS — J301 Allergic rhinitis due to pollen: Secondary | ICD-10-CM | POA: Diagnosis not present

## 2017-10-06 DIAGNOSIS — G309 Alzheimer's disease, unspecified: Secondary | ICD-10-CM | POA: Diagnosis not present

## 2017-10-07 DIAGNOSIS — I25119 Atherosclerotic heart disease of native coronary artery with unspecified angina pectoris: Secondary | ICD-10-CM | POA: Diagnosis not present

## 2017-10-07 DIAGNOSIS — D638 Anemia in other chronic diseases classified elsewhere: Secondary | ICD-10-CM | POA: Diagnosis not present

## 2017-10-07 DIAGNOSIS — I679 Cerebrovascular disease, unspecified: Secondary | ICD-10-CM | POA: Diagnosis not present

## 2017-10-07 DIAGNOSIS — J690 Pneumonitis due to inhalation of food and vomit: Secondary | ICD-10-CM | POA: Diagnosis not present

## 2017-10-07 DIAGNOSIS — N183 Chronic kidney disease, stage 3 (moderate): Secondary | ICD-10-CM | POA: Diagnosis not present

## 2017-10-07 DIAGNOSIS — J301 Allergic rhinitis due to pollen: Secondary | ICD-10-CM | POA: Diagnosis not present

## 2017-10-07 DIAGNOSIS — G309 Alzheimer's disease, unspecified: Secondary | ICD-10-CM | POA: Diagnosis not present

## 2017-10-07 DIAGNOSIS — E1159 Type 2 diabetes mellitus with other circulatory complications: Secondary | ICD-10-CM | POA: Diagnosis not present

## 2017-10-07 DIAGNOSIS — I5020 Unspecified systolic (congestive) heart failure: Secondary | ICD-10-CM | POA: Diagnosis not present

## 2017-10-07 DIAGNOSIS — R1310 Dysphagia, unspecified: Secondary | ICD-10-CM | POA: Diagnosis not present

## 2017-10-07 DIAGNOSIS — I499 Cardiac arrhythmia, unspecified: Secondary | ICD-10-CM | POA: Diagnosis not present

## 2017-10-07 DIAGNOSIS — I429 Cardiomyopathy, unspecified: Secondary | ICD-10-CM | POA: Diagnosis not present

## 2017-10-08 DIAGNOSIS — I499 Cardiac arrhythmia, unspecified: Secondary | ICD-10-CM | POA: Diagnosis not present

## 2017-10-08 DIAGNOSIS — R1310 Dysphagia, unspecified: Secondary | ICD-10-CM | POA: Diagnosis not present

## 2017-10-08 DIAGNOSIS — D638 Anemia in other chronic diseases classified elsewhere: Secondary | ICD-10-CM | POA: Diagnosis not present

## 2017-10-08 DIAGNOSIS — G309 Alzheimer's disease, unspecified: Secondary | ICD-10-CM | POA: Diagnosis not present

## 2017-10-08 DIAGNOSIS — E1159 Type 2 diabetes mellitus with other circulatory complications: Secondary | ICD-10-CM | POA: Diagnosis not present

## 2017-10-08 DIAGNOSIS — I25119 Atherosclerotic heart disease of native coronary artery with unspecified angina pectoris: Secondary | ICD-10-CM | POA: Diagnosis not present

## 2017-10-08 DIAGNOSIS — I5020 Unspecified systolic (congestive) heart failure: Secondary | ICD-10-CM | POA: Diagnosis not present

## 2017-10-08 DIAGNOSIS — I429 Cardiomyopathy, unspecified: Secondary | ICD-10-CM | POA: Diagnosis not present

## 2017-10-08 DIAGNOSIS — N183 Chronic kidney disease, stage 3 (moderate): Secondary | ICD-10-CM | POA: Diagnosis not present

## 2017-10-08 DIAGNOSIS — I679 Cerebrovascular disease, unspecified: Secondary | ICD-10-CM | POA: Diagnosis not present

## 2017-10-08 DIAGNOSIS — J690 Pneumonitis due to inhalation of food and vomit: Secondary | ICD-10-CM | POA: Diagnosis not present

## 2017-10-08 DIAGNOSIS — J301 Allergic rhinitis due to pollen: Secondary | ICD-10-CM | POA: Diagnosis not present

## 2017-10-08 LAB — CULTURE, BLOOD (ROUTINE X 2)
Culture: NO GROWTH
Culture: NO GROWTH
SPECIAL REQUESTS: ADEQUATE
SPECIAL REQUESTS: ADEQUATE

## 2017-10-09 DIAGNOSIS — J690 Pneumonitis due to inhalation of food and vomit: Secondary | ICD-10-CM | POA: Diagnosis not present

## 2017-10-09 DIAGNOSIS — I429 Cardiomyopathy, unspecified: Secondary | ICD-10-CM | POA: Diagnosis not present

## 2017-10-09 DIAGNOSIS — N183 Chronic kidney disease, stage 3 (moderate): Secondary | ICD-10-CM | POA: Diagnosis not present

## 2017-10-09 DIAGNOSIS — G309 Alzheimer's disease, unspecified: Secondary | ICD-10-CM | POA: Diagnosis not present

## 2017-10-09 DIAGNOSIS — J301 Allergic rhinitis due to pollen: Secondary | ICD-10-CM | POA: Diagnosis not present

## 2017-10-09 DIAGNOSIS — I679 Cerebrovascular disease, unspecified: Secondary | ICD-10-CM | POA: Diagnosis not present

## 2017-10-09 DIAGNOSIS — I5020 Unspecified systolic (congestive) heart failure: Secondary | ICD-10-CM | POA: Diagnosis not present

## 2017-10-09 DIAGNOSIS — J9601 Acute respiratory failure with hypoxia: Secondary | ICD-10-CM | POA: Diagnosis not present

## 2017-10-09 DIAGNOSIS — I639 Cerebral infarction, unspecified: Secondary | ICD-10-CM | POA: Diagnosis not present

## 2017-10-09 DIAGNOSIS — J189 Pneumonia, unspecified organism: Secondary | ICD-10-CM | POA: Diagnosis not present

## 2017-10-09 DIAGNOSIS — E1159 Type 2 diabetes mellitus with other circulatory complications: Secondary | ICD-10-CM | POA: Diagnosis not present

## 2017-10-09 DIAGNOSIS — R1310 Dysphagia, unspecified: Secondary | ICD-10-CM | POA: Diagnosis not present

## 2017-10-09 DIAGNOSIS — I5022 Chronic systolic (congestive) heart failure: Secondary | ICD-10-CM | POA: Diagnosis not present

## 2017-10-09 DIAGNOSIS — I25119 Atherosclerotic heart disease of native coronary artery with unspecified angina pectoris: Secondary | ICD-10-CM | POA: Diagnosis not present

## 2017-10-09 DIAGNOSIS — D638 Anemia in other chronic diseases classified elsewhere: Secondary | ICD-10-CM | POA: Diagnosis not present

## 2017-10-09 DIAGNOSIS — I499 Cardiac arrhythmia, unspecified: Secondary | ICD-10-CM | POA: Diagnosis not present

## 2017-10-10 DIAGNOSIS — N183 Chronic kidney disease, stage 3 (moderate): Secondary | ICD-10-CM | POA: Diagnosis not present

## 2017-10-10 DIAGNOSIS — I429 Cardiomyopathy, unspecified: Secondary | ICD-10-CM | POA: Diagnosis not present

## 2017-10-10 DIAGNOSIS — I679 Cerebrovascular disease, unspecified: Secondary | ICD-10-CM | POA: Diagnosis not present

## 2017-10-10 DIAGNOSIS — I499 Cardiac arrhythmia, unspecified: Secondary | ICD-10-CM | POA: Diagnosis not present

## 2017-10-10 DIAGNOSIS — G309 Alzheimer's disease, unspecified: Secondary | ICD-10-CM | POA: Diagnosis not present

## 2017-10-10 DIAGNOSIS — I5020 Unspecified systolic (congestive) heart failure: Secondary | ICD-10-CM | POA: Diagnosis not present

## 2017-10-10 DIAGNOSIS — R1310 Dysphagia, unspecified: Secondary | ICD-10-CM | POA: Diagnosis not present

## 2017-10-10 DIAGNOSIS — I25119 Atherosclerotic heart disease of native coronary artery with unspecified angina pectoris: Secondary | ICD-10-CM | POA: Diagnosis not present

## 2017-10-10 DIAGNOSIS — J301 Allergic rhinitis due to pollen: Secondary | ICD-10-CM | POA: Diagnosis not present

## 2017-10-10 DIAGNOSIS — D638 Anemia in other chronic diseases classified elsewhere: Secondary | ICD-10-CM | POA: Diagnosis not present

## 2017-10-10 DIAGNOSIS — J690 Pneumonitis due to inhalation of food and vomit: Secondary | ICD-10-CM | POA: Diagnosis not present

## 2017-10-10 DIAGNOSIS — E1159 Type 2 diabetes mellitus with other circulatory complications: Secondary | ICD-10-CM | POA: Diagnosis not present

## 2017-10-11 DIAGNOSIS — I429 Cardiomyopathy, unspecified: Secondary | ICD-10-CM | POA: Diagnosis not present

## 2017-10-11 DIAGNOSIS — R1310 Dysphagia, unspecified: Secondary | ICD-10-CM | POA: Diagnosis not present

## 2017-10-11 DIAGNOSIS — I25119 Atherosclerotic heart disease of native coronary artery with unspecified angina pectoris: Secondary | ICD-10-CM | POA: Diagnosis not present

## 2017-10-11 DIAGNOSIS — N183 Chronic kidney disease, stage 3 (moderate): Secondary | ICD-10-CM | POA: Diagnosis not present

## 2017-10-11 DIAGNOSIS — J301 Allergic rhinitis due to pollen: Secondary | ICD-10-CM | POA: Diagnosis not present

## 2017-10-11 DIAGNOSIS — I499 Cardiac arrhythmia, unspecified: Secondary | ICD-10-CM | POA: Diagnosis not present

## 2017-10-11 DIAGNOSIS — E1159 Type 2 diabetes mellitus with other circulatory complications: Secondary | ICD-10-CM | POA: Diagnosis not present

## 2017-10-11 DIAGNOSIS — I5020 Unspecified systolic (congestive) heart failure: Secondary | ICD-10-CM | POA: Diagnosis not present

## 2017-10-11 DIAGNOSIS — G309 Alzheimer's disease, unspecified: Secondary | ICD-10-CM | POA: Diagnosis not present

## 2017-10-11 DIAGNOSIS — J690 Pneumonitis due to inhalation of food and vomit: Secondary | ICD-10-CM | POA: Diagnosis not present

## 2017-10-11 DIAGNOSIS — D638 Anemia in other chronic diseases classified elsewhere: Secondary | ICD-10-CM | POA: Diagnosis not present

## 2017-10-11 DIAGNOSIS — I679 Cerebrovascular disease, unspecified: Secondary | ICD-10-CM | POA: Diagnosis not present

## 2017-11-02 DEATH — deceased

## 2018-04-03 IMAGING — CT CT HEAD W/O CM
4 series · 17 of 47 positions shown, 19 images · non-contrast
Comparison: CT of the head 10/29/2012

CLINICAL DATA: Confusion and aphasia for 2 days.

EXAM:
CT HEAD WITHOUT CONTRAST
TECHNIQUE: Contiguous axial images were obtained from the base of the skull
through the vertex without intravenous contrast.

[Series 2: head without · axial · non-contrast · 0.43mm/px · z∈[-150,-30]mm · 7 of 34 slices shown, 9 images]
[im 5/34  brain]
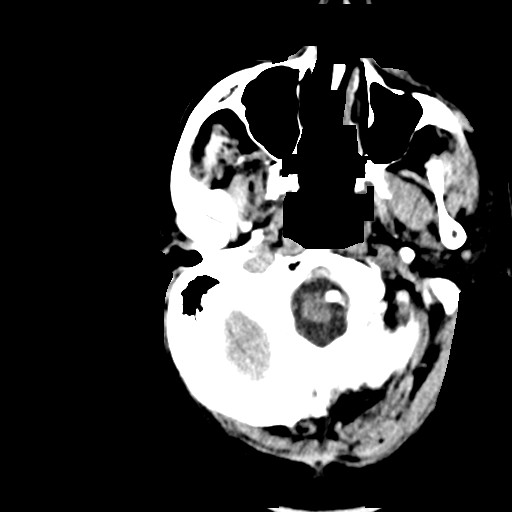
[im 5/34  bone]
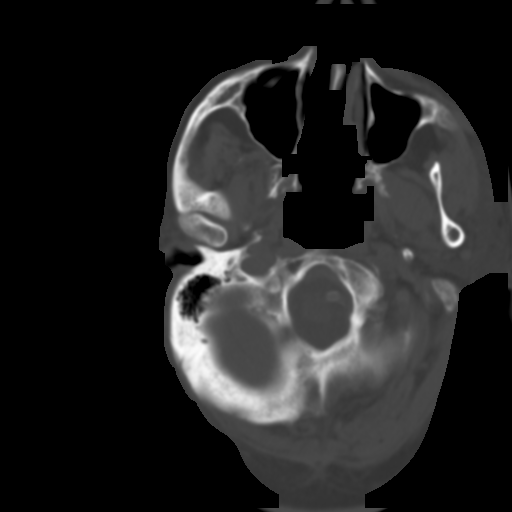
[im 9/34  brain]
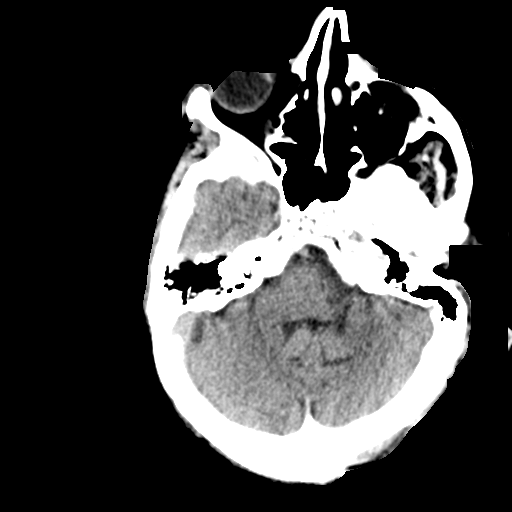
[im 13/34  brain]
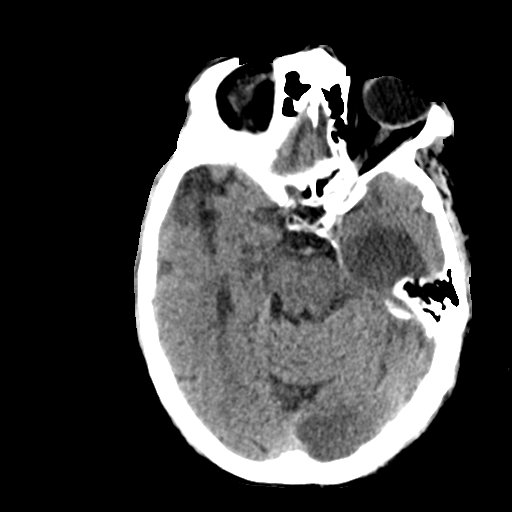
[im 17/34  brain]
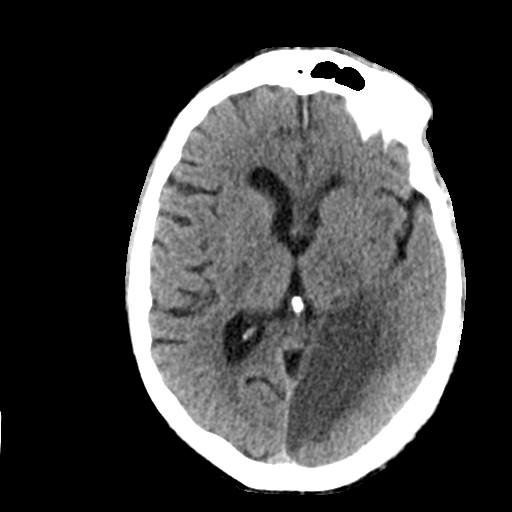
[im 21/34  brain]
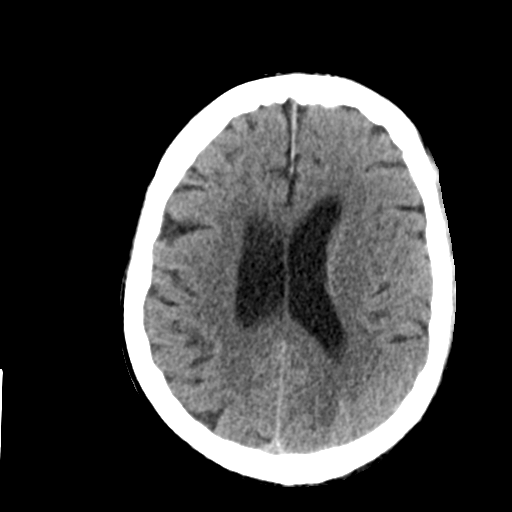
[im 21/34  bone]
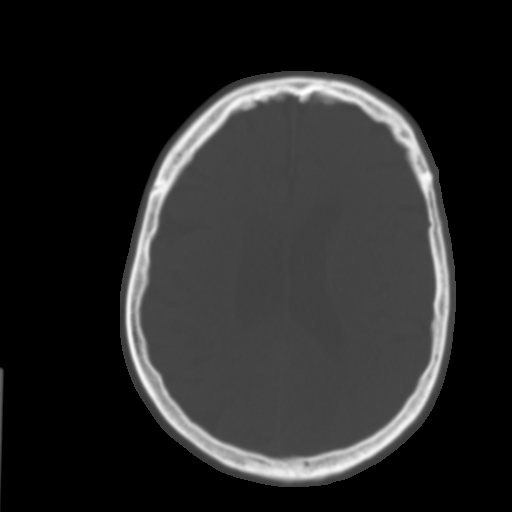
[im 25/34  brain]
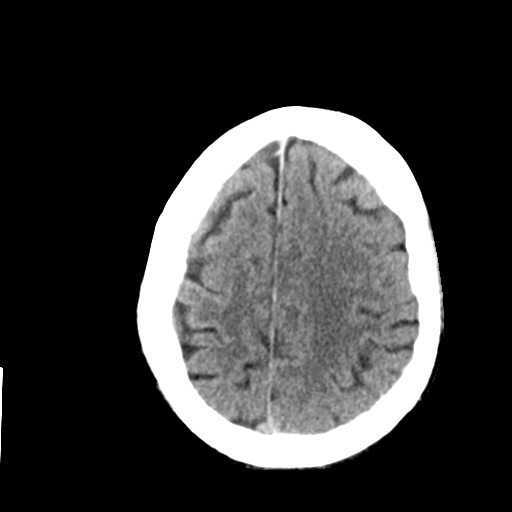
[im 29/34  brain]
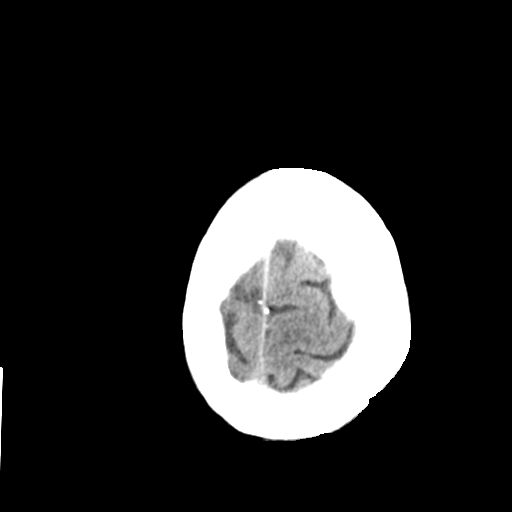

[Series 3: head bone · axial · 0.43mm/px · z∈[-154,-96]mm · 4 of 85 slices shown]
[im 9/85  bone]
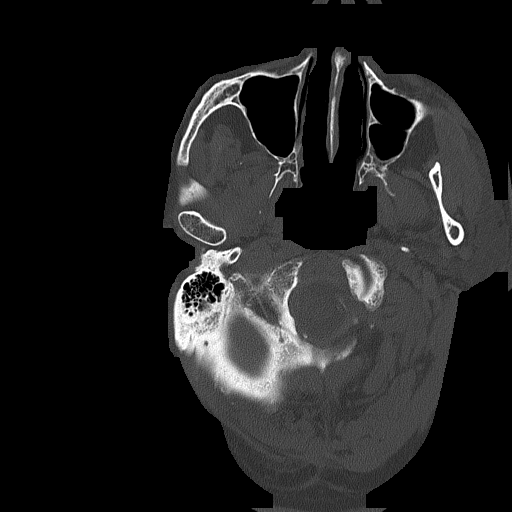
[im 17/85  bone]
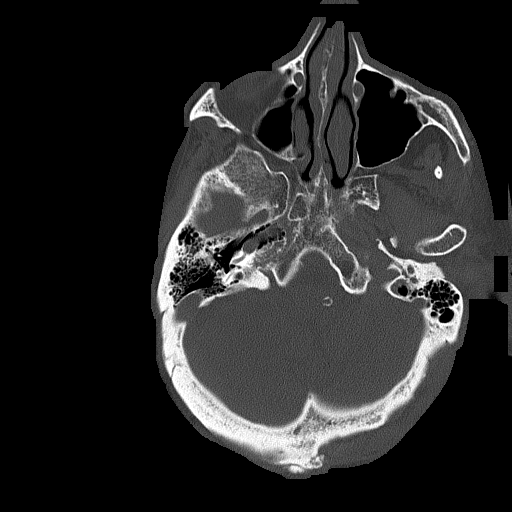
[im 26/85  bone]
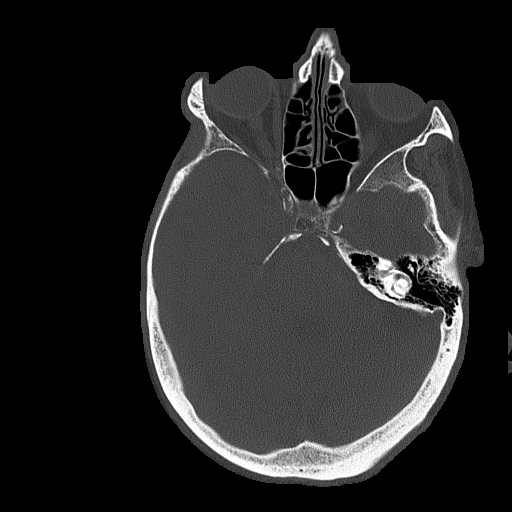
[im 38/85  bone]
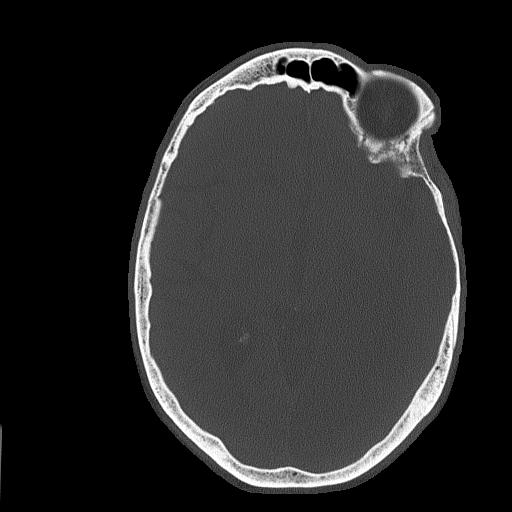

[Series 4: head without cor · coronal · non-contrast · 0.33mm/px · 3 of 71 slices shown]
[im 24/71  brain]
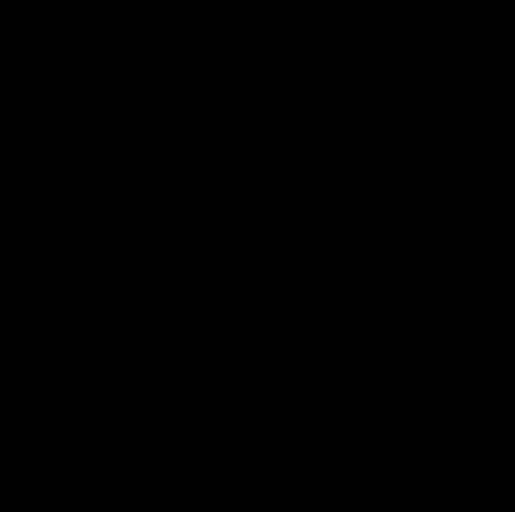
[im 32/71  brain]
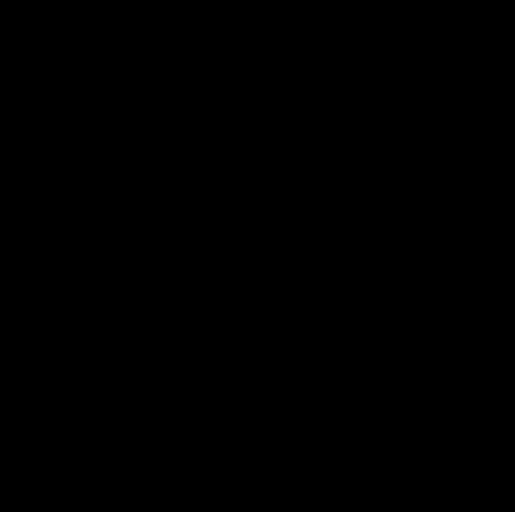
[im 39/71  brain]
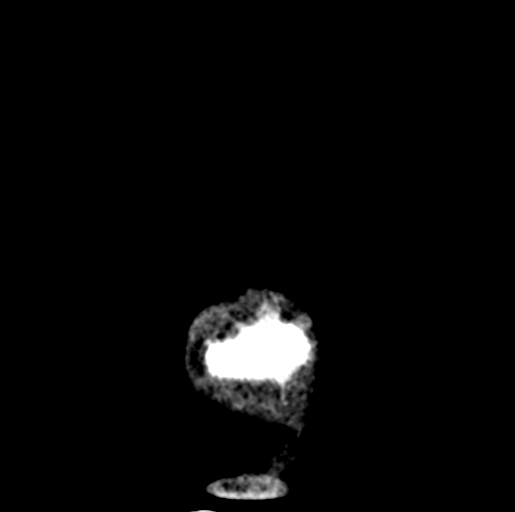

[Series 5: head without sag · sagittal · non-contrast · 0.33mm/px · 3 of 58 slices shown]
[im 23/58  brain]
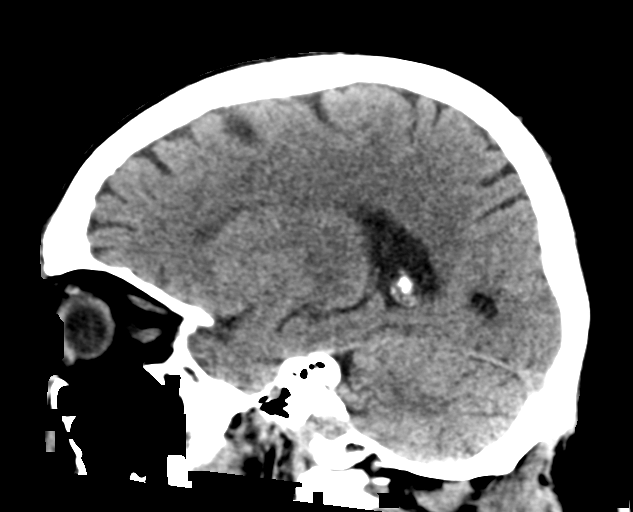
[im 29/58  brain]
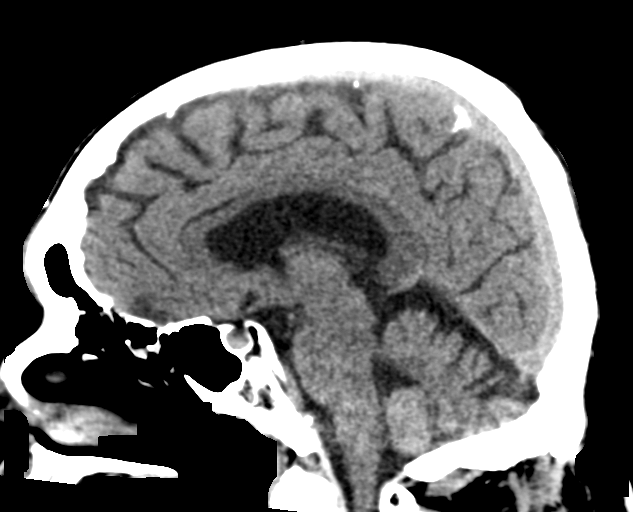
[im 35/58  brain]
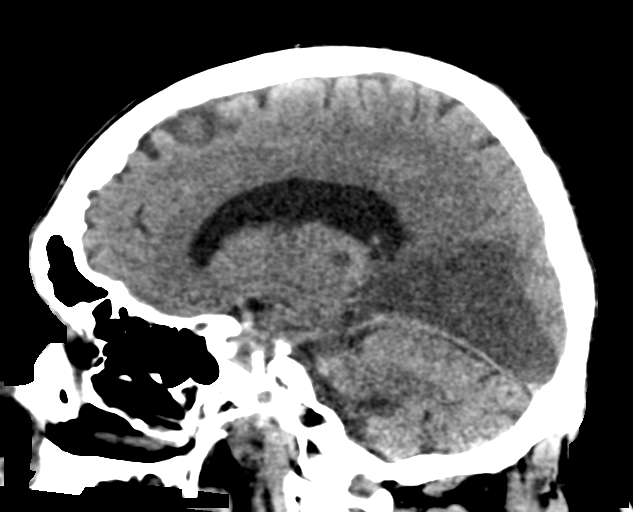

[17 of 47 positions shown; findings below may reference images not displayed]

FINDINGS: Brain: There is a large acute to subacute infarct in the left PCA
territory involving left occipital and temporal lobes with small
lacunar infarcts in the left thalamus, likely from occluded
perforating branches. There is significant vasogenic edema
compressing the posterior horn of the left lateral ventricle. No
evidence of hemorrhage, hydrocephalus or mass lesion.

Vascular: Calcific atherosclerotic disease at the skullbase.

Skull: No acute fractures.

Sinuses/Orbits: No acute finding.

Other: None.
IMPRESSION: Large nonhemorrhagic acute to subacute left PCA territory infarct.

Mass effect to the occipital horn of the left lateral ventricle
without evidence of frank hydrocephalus.

Critical Value/emergent results were called by telephone at the time
of interpretation on 11/24/2015 at [DATE] to Dr. CHANH DERAS ,
who verbally acknowledged these results.
